# Patient Record
Sex: Female | Born: 1955 | Race: White | Hispanic: No | Smoking: Never smoker
Health system: Southern US, Community
[De-identification: ages and names within clinical notes are randomized; demographics above are authoritative.]

## PROBLEM LIST (undated history)

## (undated) DIAGNOSIS — T7840XA Allergy, unspecified, initial encounter: Secondary | ICD-10-CM

## (undated) DIAGNOSIS — G43909 Migraine, unspecified, not intractable, without status migrainosus: Secondary | ICD-10-CM

## (undated) DIAGNOSIS — Z8669 Personal history of other diseases of the nervous system and sense organs: Secondary | ICD-10-CM

## (undated) DIAGNOSIS — M109 Gout, unspecified: Secondary | ICD-10-CM

## (undated) DIAGNOSIS — J069 Acute upper respiratory infection, unspecified: Secondary | ICD-10-CM

## (undated) DIAGNOSIS — K219 Gastro-esophageal reflux disease without esophagitis: Secondary | ICD-10-CM

## (undated) DIAGNOSIS — H269 Unspecified cataract: Secondary | ICD-10-CM

## (undated) DIAGNOSIS — N3010 Interstitial cystitis (chronic) without hematuria: Secondary | ICD-10-CM

## (undated) DIAGNOSIS — F419 Anxiety disorder, unspecified: Secondary | ICD-10-CM

## (undated) DIAGNOSIS — E041 Nontoxic single thyroid nodule: Secondary | ICD-10-CM

## (undated) DIAGNOSIS — M1990 Unspecified osteoarthritis, unspecified site: Secondary | ICD-10-CM

## (undated) DIAGNOSIS — K224 Dyskinesia of esophagus: Secondary | ICD-10-CM

## (undated) DIAGNOSIS — G4730 Sleep apnea, unspecified: Secondary | ICD-10-CM

## (undated) DIAGNOSIS — M722 Plantar fascial fibromatosis: Secondary | ICD-10-CM

## (undated) DIAGNOSIS — E282 Polycystic ovarian syndrome: Secondary | ICD-10-CM

## (undated) DIAGNOSIS — M7660 Achilles tendinitis, unspecified leg: Secondary | ICD-10-CM

## (undated) DIAGNOSIS — D649 Anemia, unspecified: Secondary | ICD-10-CM

## (undated) DIAGNOSIS — J45909 Unspecified asthma, uncomplicated: Secondary | ICD-10-CM

## (undated) DIAGNOSIS — A150 Tuberculosis of lung: Secondary | ICD-10-CM

## (undated) DIAGNOSIS — M797 Fibromyalgia: Secondary | ICD-10-CM

## (undated) DIAGNOSIS — Z8659 Personal history of other mental and behavioral disorders: Secondary | ICD-10-CM

## (undated) DIAGNOSIS — E785 Hyperlipidemia, unspecified: Secondary | ICD-10-CM

## (undated) DIAGNOSIS — R112 Nausea with vomiting, unspecified: Secondary | ICD-10-CM

## (undated) DIAGNOSIS — Z87442 Personal history of urinary calculi: Secondary | ICD-10-CM

## (undated) DIAGNOSIS — L309 Dermatitis, unspecified: Secondary | ICD-10-CM

## (undated) DIAGNOSIS — R319 Hematuria, unspecified: Secondary | ICD-10-CM

## (undated) DIAGNOSIS — Z9889 Other specified postprocedural states: Secondary | ICD-10-CM

## (undated) DIAGNOSIS — F32A Depression, unspecified: Secondary | ICD-10-CM

## (undated) DIAGNOSIS — R7303 Prediabetes: Secondary | ICD-10-CM

## (undated) HISTORY — PX: NASAL SINUS SURGERY: SHX719

## (undated) HISTORY — DX: Interstitial cystitis (chronic) without hematuria: N30.10

## (undated) HISTORY — DX: Anxiety disorder, unspecified: F41.9

## (undated) HISTORY — PX: SPINE SURGERY: SHX786

## (undated) HISTORY — DX: Tuberculosis of lung: A15.0

## (undated) HISTORY — DX: Nontoxic single thyroid nodule: E04.1

## (undated) HISTORY — DX: Hyperlipidemia, unspecified: E78.5

## (undated) HISTORY — DX: Hematuria, unspecified: R31.9

## (undated) HISTORY — DX: Personal history of other diseases of the nervous system and sense organs: Z86.69

## (undated) HISTORY — DX: Dyskinesia of esophagus: K22.4

## (undated) HISTORY — PX: EYE SURGERY: SHX253

## (undated) HISTORY — DX: Dermatitis, unspecified: L30.9

## (undated) HISTORY — PX: COLONOSCOPY: SHX174

## (undated) HISTORY — DX: Personal history of other mental and behavioral disorders: Z86.59

## (undated) HISTORY — DX: Unspecified cataract: H26.9

## (undated) HISTORY — DX: Allergy, unspecified, initial encounter: T78.40XA

## (undated) HISTORY — DX: Acute upper respiratory infection, unspecified: J06.9

## (undated) HISTORY — DX: Achilles tendinitis, unspecified leg: M76.60

## (undated) HISTORY — DX: Plantar fascial fibromatosis: M72.2

## (undated) HISTORY — DX: Unspecified asthma, uncomplicated: J45.909

## (undated) HISTORY — DX: Polycystic ovarian syndrome: E28.2

## (undated) HISTORY — PX: OOPHORECTOMY: SHX86

---
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
## 1983-02-23 HISTORY — PX: CHOLECYSTECTOMY: SHX55

## 1987-02-23 HISTORY — PX: BREAST LUMPECTOMY: SHX2

## 1991-02-23 DIAGNOSIS — E282 Polycystic ovarian syndrome: Secondary | ICD-10-CM

## 1991-02-23 HISTORY — DX: Polycystic ovarian syndrome: E28.2

## 1992-02-23 HISTORY — PX: THYROID SURGERY: SHX805

## 1998-02-22 HISTORY — PX: HYSTERECTOMY ABDOMINAL WITH SALPINGECTOMY: SHX6725

## 1998-02-22 HISTORY — PX: CARDIOVASCULAR STRESS TEST: SHX262

## 1998-02-22 HISTORY — PX: ABDOMINAL HYSTERECTOMY: SHX81

## 1998-08-30 ENCOUNTER — Inpatient Hospital Stay (HOSPITAL_COMMUNITY): Admission: EM | Admit: 1998-08-30 | Discharge: 1998-08-31 | Payer: Self-pay | Admitting: Cardiology

## 1998-08-31 ENCOUNTER — Encounter: Payer: Self-pay | Admitting: Cardiology

## 2004-06-14 ENCOUNTER — Encounter: Admission: RE | Admit: 2004-06-14 | Discharge: 2004-06-14 | Payer: Self-pay | Admitting: Rheumatology

## 2005-02-22 DIAGNOSIS — J45909 Unspecified asthma, uncomplicated: Secondary | ICD-10-CM

## 2005-02-22 HISTORY — DX: Unspecified asthma, uncomplicated: J45.909

## 2005-06-30 ENCOUNTER — Encounter: Admission: RE | Admit: 2005-06-30 | Discharge: 2005-06-30 | Payer: Self-pay

## 2005-07-21 ENCOUNTER — Encounter: Admission: RE | Admit: 2005-07-21 | Discharge: 2005-07-21 | Payer: Self-pay

## 2006-05-05 ENCOUNTER — Ambulatory Visit: Payer: Self-pay | Admitting: Internal Medicine

## 2006-05-16 ENCOUNTER — Ambulatory Visit (HOSPITAL_COMMUNITY): Admission: RE | Admit: 2006-05-16 | Discharge: 2006-05-16 | Payer: Self-pay | Admitting: Internal Medicine

## 2006-05-16 ENCOUNTER — Ambulatory Visit: Payer: Self-pay | Admitting: Internal Medicine

## 2006-05-26 ENCOUNTER — Emergency Department (HOSPITAL_COMMUNITY): Admission: EM | Admit: 2006-05-26 | Discharge: 2006-05-26 | Payer: Self-pay | Admitting: Emergency Medicine

## 2006-07-06 ENCOUNTER — Encounter: Admission: RE | Admit: 2006-07-06 | Discharge: 2006-07-06 | Payer: Self-pay | Admitting: Internal Medicine

## 2006-07-23 ENCOUNTER — Encounter: Admission: RE | Admit: 2006-07-23 | Discharge: 2006-07-23 | Payer: Self-pay | Admitting: Orthopedic Surgery

## 2006-08-04 ENCOUNTER — Encounter: Admission: RE | Admit: 2006-08-04 | Discharge: 2006-08-04 | Payer: Self-pay | Admitting: Orthopedic Surgery

## 2006-08-22 ENCOUNTER — Encounter: Admission: RE | Admit: 2006-08-22 | Discharge: 2006-08-22 | Payer: Self-pay | Admitting: Orthopedic Surgery

## 2006-09-09 ENCOUNTER — Encounter: Admission: RE | Admit: 2006-09-09 | Discharge: 2006-09-09 | Payer: Self-pay | Admitting: Orthopedic Surgery

## 2006-11-04 ENCOUNTER — Encounter: Admission: RE | Admit: 2006-11-04 | Discharge: 2006-11-04 | Payer: Self-pay | Admitting: Neurosurgery

## 2007-09-04 ENCOUNTER — Encounter: Admission: RE | Admit: 2007-09-04 | Discharge: 2007-09-04 | Payer: Self-pay | Admitting: Internal Medicine

## 2007-12-31 ENCOUNTER — Encounter: Admission: RE | Admit: 2007-12-31 | Discharge: 2007-12-31 | Payer: Self-pay | Admitting: Orthopedic Surgery

## 2008-01-17 ENCOUNTER — Encounter: Admission: RE | Admit: 2008-01-17 | Discharge: 2008-01-17 | Payer: Self-pay | Admitting: Orthopedic Surgery

## 2008-01-24 ENCOUNTER — Encounter: Admission: RE | Admit: 2008-01-24 | Discharge: 2008-01-24 | Payer: Self-pay | Admitting: Orthopedic Surgery

## 2008-09-03 ENCOUNTER — Encounter: Admission: RE | Admit: 2008-09-03 | Discharge: 2008-09-03 | Payer: Self-pay | Admitting: Internal Medicine

## 2009-02-22 HISTORY — PX: SHOULDER SURGERY: SHX246

## 2009-06-03 ENCOUNTER — Emergency Department (HOSPITAL_COMMUNITY): Admission: EM | Admit: 2009-06-03 | Discharge: 2009-06-04 | Payer: Self-pay | Admitting: Emergency Medicine

## 2009-08-13 ENCOUNTER — Encounter: Admission: RE | Admit: 2009-08-13 | Discharge: 2009-08-13 | Payer: Self-pay | Admitting: Endocrinology

## 2009-10-22 ENCOUNTER — Encounter: Admission: RE | Admit: 2009-10-22 | Discharge: 2009-10-22 | Payer: Self-pay | Admitting: Internal Medicine

## 2010-03-15 ENCOUNTER — Encounter: Payer: Self-pay | Admitting: Internal Medicine

## 2010-03-15 ENCOUNTER — Encounter: Payer: Self-pay | Admitting: Orthopedic Surgery

## 2010-07-10 NOTE — Consult Note (Signed)
NAME:  Christina Lewis, Christina Lewis               ACCOUNT NO.:  [id number]   MEDICAL RECORD NO.:  [id number]           PATIENT TYPE:  AMB   LOCATION:  DAY                           FACILITY:  [facility]   PHYSICIAN:  Lionel December, M.D.    DATE OF BIRTH:  December 11, 1955   DATE OF CONSULTATION:  DATE OF DISCHARGE:                                 CONSULTATION   REASON FOR CONSULTATION:  Dysphagia.  Abnormal barium esophagogram.   HISTORY OF PRESENT ILLNESS:  Hildreth is a 55 year old Caucasian female  who is referred through courtesy of Dr. Truman Hayward for a GI  evaluation.  The patient developed symptoms of bronchitis and cough  about 2 months ago.  She was treated with multiple courses of antibiotic  and never had a full recovery.  She was sent over by Dr. Sherryll Burger for ENT  evaluation with Dr. Katrinka Blazing.  Dr. Katrinka Blazing obtained a barium study.  It  showed prominent cricopharyngeal posterior impression at cervical  esophagus at the level of C5-6 with focal esophageal narrowing.  There  was also suggestion of incomplete cricopharyngeal relaxation during  deglutition, felt to be the reason for her dysphagia.  The patient was  given a barium pill, which passed through the esophagus without any  difficulty.  She was also noted to have thickened mucosal folds at the  distal thoracic esophagus.  No GE reflux was noted during this study,  but she had a small sliding hiatal hernia.   The patient states that she had her esophagus dilated back in early  1990s while she was living in [place].  She was told  she had narrowing to her proximal esophagus.  She did well for several  years.  She has been experiencing dysphagia primarily to solids.  A few  years ago it was infrequent but now happening every day.  She has more  difficulty with breads, steaks and dry foods but generally does not have  any difficulty with liquids, although she may occasionally choke with  her saliva.  She rarely experiences heartburn.   She still has a cough,  although nothing like it was a few weeks ago.  She denies regurgitation,  odynophagia, abdominal pain, melena or rectal bleeding.  She has never  been diagnosed with gastroesophageal reflux disease before.  Since her  cough was not responding to antibiotics and other therapies, Dr. Sherryll Burger  felt that maybe she is refluxing and started her on a PPI.  She feels  Prevacid is giving her diarrhea and nausea and would like to try another  PPI.  She has a very good appetite.  She states she is always hungry.  She has gained 10 pounds the last one year.   REVIEW OF THE SYSTEMS:  Positive for myalgias and arthralgias.  She also  has history of intermittent headache.  He she has never undergone a  colonoscopy.   She is presently on:  1. Spirolactone 50 mg b.i.d.  2. Risperdal 4.25 mg daily.  3. Lyrica 50 mg b.i.d.  4. Lexapro 20 mg daily.  5. MVI daily.  6.  Loratadine 10 mg daily.  7. Metformin 1 g q.h.s.  8. ASA 81 mg daily.  9. __________  1 g t.i.d.  10.Magnesium,oxide, 60 mg daily.  11.Thiamine 100 mg daily.  12.CoQ-10 60 mg t.i.d.  13.Zanaflex 2 mg at noon and 4 mg every evening.  14.Glucosamine/chondroitin one daily.  15.MSM complex one daily.  16.Lidoderm patch to her upper back or neck, on 12 hours, off 12      hours, but she does not use it every day.  17.Prevacid 30 mg q.a.m.  18.Symbicort two puffs b.i.d.  19.Astelin two puffs b.i.d.  20.Carisoprodol 350 mg b.i.d.  21.Hydrocodone 10/325 mg q.i.d.  She generally takes two or less than      two doses per day.  22.Fioricet one b.i.d. p.r.n., which she takes a rarely for her      headache.  23.Maxalt 10 mg p.r.n.  24.Nabumetone 500 mg b.i.d.  25.Omega-3 one daily.  26.Biotin 100 mg daily.  27.Vitamin D 400 units b.i.d.  28.Calcium with D 1.8 g daily.  29.B1 daily.  30.She is also using a TENS unit to her neck.   PAST MEDICAL HISTORY:  This was nicely summarized by the patient and  typed.  She had  tonsillectomy in 1974, appendectomy in 1976.  She has  history of kidney stones.  She passed one in 1983, another one in 1996,  and yet another one in 2003 and in March 2004 she developed ureteral  obstruction and underwent cystoscopy with the right ureteric stenting.  She believes she has had calcium oxalate stones.  She had some surgery  for __________  in 1984 and another surgery, sinus surgery, in 2003.  She had a cystoscopy with right ureteric stenting in March 2004.   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  She had benign  lump removed from her left breast in 1989.  She had a large cyst  aspirated from her thyroid in 1994.  She has a history of multinodular  goiter.  She had hysterectomy with BSO in 2002.   She was treated for a positive PPD back in 1963.  She remembers she took  INH for 1 year.  She has a history of polycystic ovary disease diagnosed  in 1993.  She is having some problems with __________ ., etc., and on  Aldactone.  Sleep apnea.  She had a sleep study in 1997, 2003 and 2007,  and is using CPAP.  Diabetes mellitus was diagnosed in 2005.  She has  osteoarthrosis, fibromyalgia, history of bronchial asthma, __________  diagnosed 4 years ago.   ALLERGIES:  To CODEINE, resulting in pruritus and vomiting; SULFA  resulted in anaphylaxis; and she vomits with ERYTHROMYCIN.   FAMILY HISTORY:  Mother has diabetes mellitus and rheumatoid arthritis.  Father died at age 51 due to rheumatic fever with septicemia.  She has  two sisters and a brother.  The brother is in good health.  One sister  has polycystic ovary syndrome, intractable GERD and fibromyalgia, and  other one has psoriatic arthritis.   SOCIAL HISTORY:  She is married but does not have any children.  She  works at [employer].  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  She has never smoked cigarettes or drunk alcohol.  PHYSICAL EXAMINATION:  GENERAL:  A pleasant, mildly  obese Caucasian  female who is in no acute distress.  VITAL SIGNS:  She weighs 193 pounds.  She is 5 feet 4 inches tall.  Pulse 78 per minute, blood pressure 118/82, temperature is 98.6.  HEENT:  Conjunctivae are pink.  Sclerae are nonicteric.  Oropharyngeal  mucosa is normal.  Dentition in satisfactory condition.  NECK:  No neck masses or thyromegaly noted.  CARDIAC:  Regular rhythm.  Normal S1 and S2.  No murmur or gallop noted.  LUNGS:  Clear to auscultation.  ABDOMEN:  Full.  Bowel sounds are normal.  On palpation, is soft and  nontender without organomegaly or masses.  RECTAL:  Examination deferred.  EXTREMITIES:  No clubbing or edema noted.   Results of barium study reviewed above.  It was done on March 25, 2006.   ASSESSMENT:  Marialice is a 54 year old Caucasian female with multiple  medical problems, who presents with intermittent solid food dysphagia.  She also has chronic cough, recently treated with multiple courses of  antibiotic and an ENT evaluation by Dr. Katrinka Blazing was negative.  It is quite  possible that she has a chronic gastroesophageal reflux disease  resulting in cough.  Barium study suggests narrowing in the cervical  esophagus.  It is possible that she has a localized stricture which  apparently was dilated about 15 years ago.  She could also have  esophageal motility disorder.  I doubt that she has upper esophageal  sphincter dysfunction or achalasia.  If she were not to respond to  esophageal dilation, she would need further testing with esophageal  manometry including evaluation of her upper esophageal sphincter.   Questionable side effects with Prevacid.   RECOMMENDATIONS:  Esophagogastroduodenoscopy and possible dilation to be  performed at [facility] in the near future.   Will discontinue Prevacid and let her try Protonix 40 mg q.a.m., samples  given.  The patient advised to hold off her ASA for 3 days in  preparation for the procedure and, hopefully, she will  be able to go  back on it the day of the procedure.   We would like to thank Dr. Katrinka Blazing for the opportunity to participate in  the care of this nice lady.      Lionel December, M.D.  Electronically Signed     NR/MEDQ  D:  05/05/2006  T:  05/06/2006  Job:  [id number]   cc:   Truman Hayward, M.D.   Kirstie Peri, MD  Fax: [phone]   [facility]  Fax: [phone]

## 2010-07-10 NOTE — Op Note (Signed)
NAME:  Christina Lewis, Christina Lewis               ACCOUNT NO.:  [id number]   MEDICAL RECORD NO.:  [id number]          PATIENT TYPE:  AMB   LOCATION:  DAY                           FACILITY:  [facility]   PHYSICIAN:  Lionel December, M.D.    DATE OF BIRTH:  1955-09-05   DATE OF PROCEDURE:  05/16/2006  DATE OF DISCHARGE:                               OPERATIVE REPORT   PROCEDURE:  Esophagogastroduodenoscopy with esophageal dilation.   INDICATION:  Gavin is a 55 year old Caucasian female with chronic GERD,  who has also been having dysphagia and cough.  She was evaluated by Dr.  Truman Hayward.  She had a barium study which revealed a prominent  cricopharyngeus and focal narrowing at the level of C5 and C6.  She was  also noted to have a small sliding hiatal hernia and thickened folds at  distal thoracic esophagus.   She is undergoing diagnostic/therapeutic EGD.  Procedure risks were  reviewed the patient; informed consent was obtained.   MEDICATIONS FOR CONSCIOUS SEDATION:  Benzocaine spray for pharyngeal  topical anesthesia, Demerol 50 mg IV, Versed 6 mg IV.   FINDINGS:  Procedure performed in endoscopy suite.  The patient's vital  signs and O2 saturation were monitored during the procedure and remained  stable.  The patient was placed in left lateral decubitus position, and  the Pentax video scope was passed via oropharynx without any difficulty  into esophagus.   Esophagus:  Mucosa of the esophagus was carefully examined, and no  abnormality was noted other than single erosion proximal to GE junction.  GE junction was located at 34 cm, and hiatus was at 36 cm from the  incisors.  There was some erythema at the GE junction, but no ring or  stricture was noted.   Stomach:  It was empty and distended very well with insufflation.  Folds  of proximal stomach were normal.  Examination of mucosa at body, antrum,  pyloric channel as well as angularis, fundus, and cardia was normal.   Duodenum:  Bulbar  mucosa was normal.  The scope was passed into second  part of duodenum where mucosa and folds were normal.  Endoscope was  withdrawn.   Esophagus was dilated by passing 54 and 56-French Maloney dilators to  full insertion.  Esophageal mucosa was reexamined post dilation, and  there was no mucosal disruption noted.  Endoscope was withdrawn.  The  patient tolerated the procedure well.   FINAL DIAGNOSIS:  Erosive reflux esophagitis with small sliding hiatal  hernia.   No obvious narrowing or web noted to cervical esophagus.  Esophagus  dilated by passing 54 and 56-French Maloney dilators but no mucosal  disruption induced.   Small sliding hiatal hernia.   Normal exam of stomach, first and second part of the duodenum.   RECOMMENDATIONS:  Antireflux measures are reinforced.  Increase her  Prevacid to 30 mg b.i.d. x12 weeks.   The patient will call us with a progress report in 1 week.  If she  remains with dysphagia, she will need esophageal manometry with  attention to upper esophageal sphincter.  Lionel December, M.D.  Electronically Signed     NR/MEDQ  D:  05/16/2006  T:  05/16/2006  Job:  [id number]   cc:   Truman Hayward, MD   Kirstie Peri, MD  Fax: [phone]

## 2010-09-21 ENCOUNTER — Other Ambulatory Visit: Payer: Self-pay | Admitting: Internal Medicine

## 2010-09-21 DIAGNOSIS — Z1231 Encounter for screening mammogram for malignant neoplasm of breast: Secondary | ICD-10-CM

## 2010-10-02 ENCOUNTER — Ambulatory Visit (HOSPITAL_COMMUNITY)
Admission: RE | Admit: 2010-10-02 | Discharge: 2010-10-02 | Disposition: A | Discharge status: Home / Self Care | Payer: BC Managed Care – PPO | Source: Ambulatory Visit | Attending: Orthopedic Surgery | Admitting: Orthopedic Surgery

## 2010-10-02 DIAGNOSIS — M25519 Pain in unspecified shoulder: Secondary | ICD-10-CM | POA: Insufficient documentation

## 2010-10-02 DIAGNOSIS — IMO0001 Reserved for inherently not codable concepts without codable children: Secondary | ICD-10-CM | POA: Insufficient documentation

## 2010-10-02 DIAGNOSIS — M25619 Stiffness of unspecified shoulder, not elsewhere classified: Secondary | ICD-10-CM | POA: Insufficient documentation

## 2010-10-02 DIAGNOSIS — M7500 Adhesive capsulitis of unspecified shoulder: Secondary | ICD-10-CM | POA: Insufficient documentation

## 2010-10-02 DIAGNOSIS — M6281 Muscle weakness (generalized): Secondary | ICD-10-CM | POA: Insufficient documentation

## 2010-10-02 NOTE — Progress Notes (Signed)
Occupational Therapy Evaluation  Patient Name: Christina Lewis Date: 10/02/2010 Time In 1100 Time Out 1148 OT Evaluation 1100-1120 Manual Therapy [id number] Visit 1/18 Reassessment date: 10/30/10 DIAGNOSIS:  Right Adhesive Capsulitis   S:  Ive been having increased pain and decreased mobility in my right shoulder for a few months.  At first I thought it was my fibromyalgia."  History:  Mrs. Townsel first consulted with her rheumatologist, thinking her deficits were related to her fibromyalgia.  She received a cortisone injection, which did not alleviate her pain.  She had an MRI, which detected small rotator cuff tears.  She then was referred to Dr. Rennis Chris, who xrayed her shoulder and diagnosed her with adhesive capsulitis.  She has been referred to occupational thearpy for evaluation and treatment.    Pain Assessment Currently in Pain?: Yes Pain Score:   5 Pain Location: Shoulder Pain Orientation: Right Pain Type: Acute pain    Assessment RUE AROM (degrees) Right Shoulder Flexion  0-170: 135 Degrees Right Shoulder ABduction 0-140: 115 Degrees Right Shoulder Internal Rotation  0-70: 40 Degrees (shoulder was abducted to 90) Right Shoulder External Rotation  0-90: 58 Degrees (shoulder was abducted to 90) RUE Strength Right Shoulder Flexion:  (4+/5) Right Shoulder ABduction:  (4+/5) Right Shoulder Internal Rotation:  (4+/5) Right Shoulder External Rotation:  (4+/5)         Exercise/Treatments  Shoulder Exercises Shoulder Flexion: Supine;PROM;10 reps Shoulder Protraction: Supine;PROM;10 reps Shoulder Horizontal ABduction: Supine;PROM;10 reps Shoulder ABduction: Supine;PROM;10 reps Shoulder External Rotation: Supine;PROM;10 reps Shoulder Internal Rotation: Supine;PROM;10 reps Shoulder Stretch Corner Stretch:  (edcuated on for HEP) Cross Chest Stretch:  (educated on for HEP) Wall Stretch:  (Educated on for HEP) Table Stretch - Flexion:  (Educated to do with therapy  ball for HEP) Table Stretch - Abduction:  (Educated to do for HEP with therapy ball)  Manual Therapy Manual Therapy: Myofascial release Myofascial Release: MFR and manual stretching to right scapular, pectoral, trapezius, and upper arm.  Deep tissue massage to subscapularis.  Treatment done to decrease pain and restrictions and increase AROM.  HEP:  Therapy ball flex, abd, shoulder stretches. Goals Short Term Goals 3 weeks Short Term Goal 1: Patient will be educated on a HEP. Short Term Goal 2: Patient will decrease pain to 3/10 while fastening her bra. Short Term Goal 3: Patient will increase AROM by 10 degrees for increased independence fastening her bra. Short Term Goal 4: Patient will decrease fascial restrictions from mod-max to mod.   Long Term Goals 6 weeks Long Term Goal 1: Patient will return to prior level of independence with her B/IADLs and leisure activities. Long Term Goal 2: Patient will decrease pain to 1/10 in her right shoulder when washing dishes or cooking. Long Term Goal 3: Patient will increase AROM to [facility] for increased independence pulling up her pants and putting away dishes. Long Term Goal 4: Patient will increase right shoulder strength to 5/5 for increased independence with lifting groceries. Long Term Goal 5: Patient will decrease fascial restrictions from mod-max to min. End of Session Patient Active Problem List  Diagnoses  . Pain in joint, shoulder region  . Adhesive capsulitis of shoulder  . Muscle weakness (generalized)   End of Session Activity Tolerance: Patient tolerated treatment well General Behavior During Session: [facility] for tasks performed Cognition: [facility] for tasks performed OT Assessment and Plan Clinical Impression Statement: A:  Patient presents with decreased A/PROM, strength and increased pain and fascial retrictions in her right shoulder region. Prognosis:  Excellent OT Frequency: Min 3X/week OT Duration: 6 weeks OT  Treatment/Interventions: Self-care/ADL training;Therapeutic exercise;Manual therapy;Therapeutic activities;Patient/family education;Other (comment) (Modalities as needed) OT Plan: P:  MFR and manual stretching to right shoulder to decrease pain and increase AROM.  Ther Ex:  supine PROM, AAROM shoulder flexion, abd, ext rot, int rot, horiz abd, prot.  ball flex, abd, r/l, thumbtacks, elev/dep//prot/ret, wall wash, scapular tband, UBE  Time Calculation Start Time: 1100 Stop Time: 1148 Time Calculation (min): 48 min  Shirlean Mylar, OTR/L  10/02/2010, 3:11 PM

## 2010-10-06 ENCOUNTER — Ambulatory Visit (HOSPITAL_COMMUNITY)
Admission: RE | Admit: 2010-10-06 | Discharge: 2010-10-06 | Disposition: A | Discharge status: Home / Self Care | Payer: BC Managed Care – PPO | Source: Ambulatory Visit | Attending: Internal Medicine | Admitting: Internal Medicine

## 2010-10-06 DIAGNOSIS — M7500 Adhesive capsulitis of unspecified shoulder: Secondary | ICD-10-CM

## 2010-10-06 DIAGNOSIS — M6281 Muscle weakness (generalized): Secondary | ICD-10-CM

## 2010-10-06 DIAGNOSIS — M25519 Pain in unspecified shoulder: Secondary | ICD-10-CM

## 2010-10-06 NOTE — Progress Notes (Signed)
Occupational Therapy Treatment  Patient Name: Christina Lewis MRN: [id number] Today's Date: 10/06/2010 Time In 811 Time Out 902 Manual Therapy 811-830 Therapeutic Exercise 831-902  Visit 2/18  Reassessment date: 10/30/10  DIAGNOSIS: Right Adhesive Capsulitis  HPI: Symptoms/Limitations Symptoms: S:  I feel pretty good today, no real pain. Pain Assessment Currently in Pain?: No/denies        Exercise/Treatments Shoulder Exercises Shoulder Flexion: Supine;PROM;AAROM;10 reps Therapy Ball (Shoulder Flexion): 25 Shoulder Extension: Theraband;10 reps (red) Shoulder Retraction: Theraband;10 reps (red) Shoulder Protraction: Supine;PROM;AAROM;10 reps Shoulder Horizontal ABduction: Supine;PROM;AAROM;10 reps Shoulder ABduction: Supine;PROM;AAROM;10 reps Therapy Ball (Shoulder Abduction): 25 also 5 right and left Shoulder External Rotation: Supine;PROM;AAROM;10 reps Shoulder Internal Rotation: Supine;PROM;AAROM;10 reps Row: Theraband;10 reps (red) Additional ROM/Strengthening Exercises UBE (Upper Arm Bike): 3' and 3' 1.0 Thumb Tacks: 1 min Elevation/Depression - Shoulder Pro/Retraction:   Manual Therapy Manual Therapy: Myofascial release Myofascial Release: MFR and manual stretching to right scapular, pectoral, trapezius, and upper arm to decrease pain and restrictions and increase pain free range of motion.   Goals Short Term Goals Short Term Goal 1: Patient will be educated on a HEP. Short Term Goal 1 Progress: Progressing toward goal Short Term Goal 2: Patient will decrease pain to 3/10 while fastening her bra. Short Term Goal 2 Progress: Progressing toward goal Short Term Goal 3: Patient will increase AROM by 10 degrees for increased independence fastening her bra. Short Term Goal 3 Progress: Progressing toward goal Short Term Goal 4: Patient will decrease fascial restrictions from mod-max to mod.   Short Term Goal 4 Progress: Progressing toward goal Long Term  Goals Long Term Goal 1: Patient will return to prior level of independence with her B/IADLs and leisure activities. Long Term Goal 2: Patient will decrease pain to 1/10 in her right shoulder when washing dishes or cooking. Long Term Goal 3: Patient will increase AROM to [facility] for increased independence pulling up her pants and putting away dishes. Long Term Goal 4: Patient will increase right shoulder strength to 5/5 for increased independence with lifting groceries. Long Term Goal 5: Patient will decrease fascial restrictions from mod-max to min. End of Session Patient Active Problem List  Diagnoses  . Pain in joint, shoulder region  . Adhesive capsulitis of shoulder  . Muscle weakness (generalized)   End of Session Activity Tolerance: Patient tolerated treatment well General Behavior During Session: [facility] for tasks performed Cognition: [facility] for tasks performed OT Assessment and Plan Clinical Impression Statement: A:  Increased PROM this date. OT Plan: P:  Add wall wash and attempt AROM in supine.   Shirlean Mylar, OTR/L  10/06/2010, 9:14 AM

## 2010-10-08 ENCOUNTER — Ambulatory Visit (HOSPITAL_COMMUNITY)
Admission: RE | Admit: 2010-10-08 | Discharge: 2010-10-08 | Disposition: A | Discharge status: Home / Self Care | Payer: BC Managed Care – PPO | Source: Ambulatory Visit | Attending: Specialist | Admitting: Specialist

## 2010-10-08 DIAGNOSIS — M7500 Adhesive capsulitis of unspecified shoulder: Secondary | ICD-10-CM

## 2010-10-08 DIAGNOSIS — M25519 Pain in unspecified shoulder: Secondary | ICD-10-CM

## 2010-10-08 DIAGNOSIS — M6281 Muscle weakness (generalized): Secondary | ICD-10-CM

## 2010-10-08 NOTE — Progress Notes (Signed)
Occupational Therapy Treatment  Patient Name: Christina Lewis MRN: [id number] Today's Date: 10/08/2010 Time In 807 Time Out 859 Manual Therapy 807-824   Therapeutic Exercise 825-859 Visit 3/18  Reassessment date: 10/30/10  DIAGNOSIS: Right Adhesive Capsulitis   S:  I have been a little sore since Tuesday. Pain Assessment Currently in Pain?: Yes Pain Score:   4 Pain Location: Shoulder Pain Orientation: Right      Exercise/Treatments Shoulder Exercises Shoulder Flexion: Supine;AROM;10 reps;AAROM (12 reps) Therapy Ball (Shoulder Flexion): 25 Shoulder Extension: Theraband;15 reps (red) Shoulder Retraction: Theraband;15 reps (red) Shoulder Protraction: Supine;AROM;10 reps;AAROM (12 reps) Shoulder Horizontal ABduction: Supine;AROM;10 reps;AAROM (12 reps) Shoulder ABduction: Supine;AROM;10 reps;AAROM (12 reps) Therapy Ball (Shoulder Abduction): 25 also 5 right and left Shoulder External Rotation: Supine;AROM;10 reps;AAROM (12 reps) Shoulder Internal Rotation: Supine;AROM;10 reps;AAROM (12 reps) Row: Theraband;15 reps (red) Additional ROM/Strengthening Exercises UBE (Upper Arm Bike): 3' and 3' 1.0 Wall Wash: 2 minutes Thumb Tacks: 1 min Elevation/Depression - Shoulder Pro/Retraction: 1 min  Manual Therapy Manual Therapy: Myofascial release Myofascial Release: MFR and manual stretching to right scapular, pectoral, trapezius, and upper arm to decrease pain and restrictions and increase pain free range of motion.  Gentle traction to right arm.  Supine PROM 10 reps shoulder protraction, horiz abd, ext rot, int rot, flexion, and abduction.   Goals Short Term Goals Short Term Goal 1: Patient will be educated on a HEP. Short Term Goal 2: Patient will decrease pain to 3/10 while fastening her bra. Short Term Goal 3: Patient will increase AROM by 10 degrees for increased independence fastening her bra. Short Term Goal 4: Patient will decrease fascial restrictions from mod-max to mod.     Long Term Goals Long Term Goal 1: Patient will return to prior level of independence with her B/IADLs and leisure activities. Long Term Goal 2: Patient will decrease pain to 1/10 in her right shoulder when washing dishes or cooking. Long Term Goal 3: Patient will increase AROM to [facility] for increased independence pulling up her pants and putting away dishes. Long Term Goal 4: Patient will increase right shoulder strength to 5/5 for increased independence with lifting groceries. Long Term Goal 5: Patient will decrease fascial restrictions from mod-max to min. End of Session Patient Active Problem List  Diagnoses  . Pain in joint, shoulder region  . Adhesive capsulitis of shoulder  . Muscle weakness (generalized)   End of Session Activity Tolerance: Patient tolerated treatment well General Behavior During Session: [facility] for tasks performed Cognition: [facility] for tasks performed OT Assessment and Plan Clinical Impression Statement: A:  Added AROM in supine this date added wall wash. OT Plan: P:  Add seated AROM.     Shirlean Mylar, OTR/L  10/08/2010, 11:15 AM

## 2010-10-09 ENCOUNTER — Ambulatory Visit (HOSPITAL_COMMUNITY): Payer: BC Managed Care – PPO | Admitting: Specialist

## 2010-10-12 ENCOUNTER — Ambulatory Visit (HOSPITAL_COMMUNITY)
Admission: RE | Admit: 2010-10-12 | Discharge: 2010-10-12 | Disposition: A | Discharge status: Home / Self Care | Payer: BC Managed Care – PPO | Source: Ambulatory Visit | Attending: Occupational Therapy | Admitting: Occupational Therapy

## 2010-10-12 NOTE — Progress Notes (Signed)
Occupational Therapy Treatment  Patient Name: BRYLEIGH OTTAWAY MRN: [id number] Today's Date: 10/12/2010  Time In 108 Time Out 204  Manual Therapy 108-126  Therapeutic Exercise 127-204 Visit 4/18  Reassessment date: 10/30/10  DIAGNOSIS: Right Adhesive Capsulitis    HPI: Symptoms/Limitations Symptoms: I think the weather is making it hurt today. Pain Assessment Currently in Pain?: Yes Pain Score:   5 Pain Location: Shoulder Pain Orientation: Right Pain Type: Acute pain     Exercise/Treatments Shoulder Exercises Shoulder Flexion: PROM;AAROM;10 reps;Supine;Seated Therapy Ball (Shoulder Flexion): 25 Shoulder Protraction: PROM;AROM;10 reps;Supine Shoulder Horizontal ABduction: PROM;AROM;10 reps;Supine;Seated Shoulder ABduction: PROM;AROM;10 reps;Supine;Seated Therapy Ball (Shoulder Abduction): 25 also 5 right and left Shoulder External Rotation: PROM;AROM;10 reps;Supine;Seated Shoulder Internal Rotation: PROM;AROM;10 reps;Supine;Seated Manual Therapy Manual Therapy: Myofascial release Myofascial Release: MFR and manual stretching to right scapular, pectoral, trapezius, and upper arm. Deep tissue massage to subscapularis. Treatment done to decrease pain and restrictions and increase AROM.   Goals Short Term Goals Short Term Goal 1: Patient will be educated on a HEP. Short Term Goal 1 Progress: Progressing toward goal Short Term Goal 2: Patient will decrease pain to 3/10 while fastening her bra. Short Term Goal 2 Progress: Progressing toward goal Short Term Goal 3: Patient will increase AROM by 10 degrees for increased independence fastening her bra. Short Term Goal 3 Progress: Progressing toward goal Short Term Goal 4: Patient will decrease fascial restrictions from mod-max to mod.   Short Term Goal 4 Progress: Progressing toward goal Long Term Goals Long Term Goal 1: Patient will return to prior level of independence with her B/IADLs and leisure activities. Long Term  Goal 1 Progress: Progressing toward goal Long Term Goal 2: Patient will decrease pain to 1/10 in her right shoulder when washing dishes or cooking. Long Term Goal 2 Progress: Progressing toward goal Long Term Goal 3: Patient will increase AROM to [facility] for increased independence pulling up her pants and putting away dishes. Long Term Goal 3 Progress: Progressing toward goal Long Term Goal 4: Patient will increase right shoulder strength to 5/5 for increased independence with lifting groceries. Long Term Goal 4 Progress: Progressing toward goal Long Term Goal 5: Patient will decrease fascial restrictions from mod-max to min. Long Term Goal 5 Progress: Progressing toward goal End of Session Patient Active Problem List  Diagnoses  . Pain in joint, shoulder region  . Adhesive capsulitis of shoulder  . Muscle weakness (generalized)   End of Session Activity Tolerance: Patient tolerated treatment well OT Assessment and Plan Clinical Impression Statement: added seated AROM Prognosis: Good OT Plan: increase reps with AROM supine and seated.   Valta Dillon L. Claiborne Stroble, COTA/L  10/12/2010, 1:49 PM

## 2010-10-14 ENCOUNTER — Ambulatory Visit (HOSPITAL_COMMUNITY)
Admission: RE | Admit: 2010-10-14 | Discharge: 2010-10-14 | Disposition: A | Discharge status: Home / Self Care | Payer: BC Managed Care – PPO | Source: Ambulatory Visit | Attending: Orthopedic Surgery | Admitting: Orthopedic Surgery

## 2010-10-14 DIAGNOSIS — M6281 Muscle weakness (generalized): Secondary | ICD-10-CM

## 2010-10-14 DIAGNOSIS — M7500 Adhesive capsulitis of unspecified shoulder: Secondary | ICD-10-CM

## 2010-10-14 DIAGNOSIS — M25519 Pain in unspecified shoulder: Secondary | ICD-10-CM

## 2010-10-14 NOTE — Progress Notes (Signed)
Occupational Therapy Treatment  Patient Name: Christina Lewis MRN: [id number] Today's Date: 10/14/2010 Time In 108 Time Out 202 Manual Therapy 108-130 Therapeutic Exercise 131-202  Visit 5/18  Reassessment date: 10/30/10  DIAGNOSIS: Right Adhesive Capsulitis  S:  Its been sore the last few days, it may be the weather. Pain Assessment Currently in Pain?: Yes Pain Score:   4 Pain Location: Shoulder Pain Orientation: Right  Exercise/Treatments Shoulder Exercises Shoulder Flexion: Supine;PROM;10 reps;AROM;Seated (AROM 12 reps) Therapy Ball (Shoulder Flexion): 25 Shoulder Extension: Theraband;10 reps (green) Shoulder Retraction: Theraband;10 reps (gren) Shoulder Protraction: Supine;PROM;10 reps;AROM;Seated (AROM 12 reps) Shoulder Horizontal ABduction: Supine;PROM;10 reps;AROM;Seated (AROM 12 rreps) Shoulder ABduction: Supine;PROM;10 reps;AROM;Seated (AROM 12 reps) Therapy Ball (Shoulder Abduction): 25 also 5 right and left Shoulder External Rotation: Supine;PROM;10 reps;AROM;Seated (AROM 12 reps) Shoulder Internal Rotation: Supine;PROM;10 reps;AROM;Seated (AROM 12 reps) Row: Theraband;10 reps (green) Additional ROM/Strengthening Exercises UBE (Upper Arm Bike): 3' and 3' 1.5 Wall Wash: 3.5' Thumb Tacks: 1 min Elevation/Depression - Shoulder Pro/Retraction: Additional Elbow Exercises UBE (Upper Arm Bike): 3' and 3' 1.5  Manual Therapy Manual Therapy: Myofascial release Myofascial Release: MFR and manual stretching to right scapular, pectoral, trapezius, and upper arm, trigger point release done to subscapularis.  Posterior capsule stretch done x 1 min.  MFR completed to decrease pain and fascial restrictions and increase AROM.     Goals Short Term Goals Short Term Goal 1: Patient will be educated on a HEP. Short Term Goal 1 Progress: Progressing toward goal Short Term Goal 2: Patient will decrease pain to 3/10 while fastening her bra. Short Term Goal 2 Progress:  Progressing toward goal Short Term Goal 3: Patient will increase AROM by 10 degrees for increased independence fastening her bra. Short Term Goal 3 Progress: Progressing toward goal Short Term Goal 4: Patient will decrease fascial restrictions from mod-max to mod.   Short Term Goal 4 Progress: Progressing toward goal Long Term Goals Long Term Goal 1: Patient will return to prior level of independence with her B/IADLs and leisure activities. Long Term Goal 1 Progress: Progressing toward goal Long Term Goal 2: Patient will decrease pain to 1/10 in her right shoulder when washing dishes or cooking. Long Term Goal 2 Progress: Progressing toward goal Long Term Goal 3: Patient will increase AROM to [facility] for increased independence pulling up her pants and putting away dishes. Long Term Goal 3 Progress: Progressing toward goal Long Term Goal 4: Patient will increase right shoulder strength to 5/5 for increased independence with lifting groceries. Long Term Goal 4 Progress: Progressing toward goal Long Term Goal 5: Patient will decrease fascial restrictions from mod-max to min. Long Term Goal 5 Progress: Progressing toward goal End of Session Patient Active Problem List  Diagnoses  . Pain in joint, shoulder region  . Adhesive capsulitis of shoulder  . Muscle weakness (generalized)   End of Session Activity Tolerance: Patient tolerated treatment well General Behavior During Session: [facility] for tasks performed Cognition: [facility] for tasks performed OT Assessment and Plan Clinical Impression Statement: A:  Increased to 12 reps with supine and seated AROM exercises.  Added posterior capsule stretch.  Requires tactile cues for proper positioning for int rot and ext rot OT Plan: P:  Attempt 1 pound with supine exercises.  Increase to 2.0 on UBE.   Shirlean Mylar, OTR/L  10/14/2010, 2:01 PM

## 2010-10-15 ENCOUNTER — Inpatient Hospital Stay (HOSPITAL_COMMUNITY): Admission: RE | Admit: 2010-10-15 | Payer: BC Managed Care – PPO | Source: Ambulatory Visit | Admitting: Specialist

## 2010-10-19 ENCOUNTER — Ambulatory Visit (HOSPITAL_COMMUNITY)
Admission: RE | Admit: 2010-10-19 | Discharge: 2010-10-19 | Disposition: A | Discharge status: Home / Self Care | Payer: BC Managed Care – PPO | Source: Ambulatory Visit | Attending: Orthopedic Surgery | Admitting: Orthopedic Surgery

## 2010-10-19 DIAGNOSIS — M7500 Adhesive capsulitis of unspecified shoulder: Secondary | ICD-10-CM

## 2010-10-19 DIAGNOSIS — M25519 Pain in unspecified shoulder: Secondary | ICD-10-CM

## 2010-10-19 DIAGNOSIS — M6281 Muscle weakness (generalized): Secondary | ICD-10-CM

## 2010-10-19 NOTE — Progress Notes (Signed)
Occupational Therapy Treatment  Patient Name: Christina Lewis MRN: [id number] Today's Date: 10/19/2010    NFA:OZHY In 106 Time Out 158  Manual Therapy 106-131  Therapeutic Exercise 131-158  Visit 5/18  Reassessment date: 10/30/10  DIAGNOSIS: Right Adhesive Capsulitis  Symptoms/Limitations Symptoms: I feel so/so, I went and worked in the yard, my back hurts. Pain Assessment Currently in Pain?: Yes Pain Score:   3 Pain Location: Shoulder Pain Orientation: Right Pain Type: Acute pain      Exercise/Treatments   10/19/10 1311  Shoulder Flexion PROM;Strengthening;Right;10 reps;Supine Bar Weights/Barbell (Shoulder Flexion) 1 lb Therapy Ball (Shoulder Flexion) 25 Shoulder Extension Theraband;15 reps Theraband Level (Shoulder Extension) Level 3 (Green) Shoulder ABduction PROM;Strengthening;Right;10 reps;Supine Bar Weights/Barbell (Shoulder Abduction) 1 lb Therapy Ball (Shoulder Abduction) 25 also 5 right and left Shoulder Horizontal ABduction PROM;Strengthening;Right;10 reps;Supine Bar Weights/Barbell (Shoulder Horizontal Abduction) 1 lb Shoulder Retraction Theraband;15 reps Theraband Level (Shoulder Retraction) Level 3 (Green) Row [employer] reps Theraband Level (Row) Level 3 (Green) Shoulder Internal Rotation PROM;Strengthening;10 reps;Supine;Theraband;15 reps Theraband Level (Shoulder Internal Rotation) Level 3 (Green) Bar Weights/Barbell (Shoulder Internal Rotation) 1 lb Shoulder External Rotation PROM;Strengthening;Right;10 reps;Supine;Theraband;15 reps Theraband Level (Shoulder External Rotation) Level 3 (Green) Bar Weights/Barbell (Shoulder External Rotation) 1 lb UBE (Upper Arm Bike) 3' and 3' 2.0 Shoulder Protraction PROM;Strengthening;10 reps;Supine Bar Weights/Barbell (Shoulder Protraction) 1 lb Additional ROM/Strengthening Exercises Wall Wash 4' Thumb Tacks 1 min Elevation/Depression - Shoulder Pro/Retraction Manual Therapy Manual Therapy Myofascial  release  Manual Therapy Myofascial Release: MFR and manual stretching to right scapular, pectoral, trapezius, and upper arm. Deep tissue massage to subscapularis. Treatment done to decrease pain and restrictions and increase AROM.   Goals Short Term Goals Short Term Goal 1: Patient will be educated on a HEP. Short Term Goal 1 Progress: Progressing toward goal Short Term Goal 2: Patient will decrease pain to 3/10 while fastening her bra. Short Term Goal 2 Progress: Progressing toward goal Short Term Goal 3: Patient will increase AROM by 10 degrees for increased independence fastening her bra. Short Term Goal 3 Progress: Progressing toward goal Short Term Goal 4: Patient will decrease fascial restrictions from mod-max to mod.   Short Term Goal 4 Progress: Progressing toward goal Long Term Goals Long Term Goal 1: Patient will return to prior level of independence with her B/IADLs and leisure activities. Long Term Goal 1 Progress: Progressing toward goal Long Term Goal 2: Patient will decrease pain to 1/10 in her right shoulder when washing dishes or cooking. Long Term Goal 2 Progress: Progressing toward goal Long Term Goal 3: Patient will increase AROM to [facility] for increased independence pulling up her pants and putting away dishes. Long Term Goal 3 Progress: Progressing toward goal Long Term Goal 4: Patient will increase right shoulder strength to 5/5 for increased independence with lifting groceries. Long Term Goal 4 Progress: Progressing toward goal Long Term Goal 5: Patient will decrease fascial restrictions from mod-max to min. Long Term Goal 5 Progress: Progressing toward goal End of Session Patient Active Problem List  Diagnoses  . Pain in joint, shoulder region  . Adhesive capsulitis of shoulder  . Muscle weakness (generalized)   End of Session Activity Tolerance: Patient tolerated treatment well OT Assessment and Plan Clinical Impression Statement: added 1# to supine  exercises and added tband to HEP Prognosis: Good OT Plan: increase reps with supine and seated.  Attempt 1# with seated ex.   Nickayla Mcinnis L. Noralee Stain, COTA/L  10/19/2010, 6:15 PM

## 2010-10-21 ENCOUNTER — Ambulatory Visit (HOSPITAL_COMMUNITY)
Admission: RE | Admit: 2010-10-21 | Discharge: 2010-10-21 | Disposition: A | Discharge status: Home / Self Care | Payer: BC Managed Care – PPO | Source: Ambulatory Visit | Attending: Orthopedic Surgery | Admitting: Orthopedic Surgery

## 2010-10-21 NOTE — Progress Notes (Signed)
Occupational Therapy Treatment  Patient Name: Christina Lewis MRN: [id number] Today's Date: 10/21/2010    Time In 108 Time Out 205  Manual Therapy 108-134  Therapeutic Exercise 134-205  Visit 6/18  Reassessment date: 10/30/10  DIAGNOSIS: Right Adhesive Capsulitis     HPI: Symptoms/Limitations Symptoms: It hurts today, really bothers me. Pain Assessment Currently in Pain?: Yes Pain Score:   7 Pain Location: Shoulder Pain Orientation: Right Pain Type: Acute pain   Exercise/Treatments Shoulder Exercises Shoulder Flexion: PROM;Strengthening;15 reps;Supine Therapy Ball (Shoulder Flexion): 25 Bar Weights/Barbell (Shoulder Flexion): 1 lb Shoulder Extension:  (d/c to hep) Shoulder Retraction:  (d/c to hep) Shoulder Protraction: PROM;Strengthening;15 reps;Supine Bar Weights/Barbell (Shoulder Protraction): 1 lb Shoulder Horizontal ABduction: PROM;Strengthening;15 reps;Supine Bar Weights/Barbell (Shoulder Horizontal Abduction): 1 lb Shoulder ABduction: PROM;Strengthening;15 reps;Supine Therapy Ball (Shoulder Abduction): 25 also 5 right and left Bar Weights/Barbell (Shoulder Abduction): 1 lb Shoulder External Rotation: PROM;Strengthening;15 reps;Supine Theraband Level (Shoulder External Rotation):  (d/c to hep) Bar Weights/Barbell (Shoulder External Rotation): 1 lb Shoulder Internal Rotation: PROM;Strengthening;15 reps;Supine Theraband Level (Shoulder Internal Rotation):  (d/c to hep) Bar Weights/Barbell (Shoulder Internal Rotation): 1 lb Row:  (d/c to hep) Additional ROM/Strengthening Exercises UBE (Upper Arm Bike): 3' and 3' 2.0 Wall Wash: 4' Thumb Tacks: 1 min Elevation/Depression - Shoulder Pro/Retraction: Additional Elbow Exercises UBE (Upper Arm Bike): 3' and 3' 2.0     Goals Short Term Goals Short Term Goal 1: Patient will be educated on a HEP. Short Term Goal 1 Progress: Progressing toward goal Short Term Goal 2: Patient will decrease pain to 3/10 while  fastening her bra. Short Term Goal 2 Progress: Progressing toward goal Short Term Goal 3: Patient will increase AROM by 10 degrees for increased independence fastening her bra. Short Term Goal 3 Progress: Progressing toward goal Short Term Goal 4: Patient will decrease fascial restrictions from mod-max to mod.   Short Term Goal 4 Progress: Progressing toward goal Long Term Goals Long Term Goal 1: Patient will return to prior level of independence with her B/IADLs and leisure activities. Long Term Goal 1 Progress: Progressing toward goal Long Term Goal 2: Patient will decrease pain to 1/10 in her right shoulder when washing dishes or cooking. Long Term Goal 2 Progress: Progressing toward goal Long Term Goal 3: Patient will increase AROM to [facility] for increased independence pulling up her pants and putting away dishes. Long Term Goal 3 Progress: Progressing toward goal Long Term Goal 4: Patient will increase right shoulder strength to 5/5 for increased independence with lifting groceries. Long Term Goal 4 Progress: Progressing toward goal Long Term Goal 5: Patient will decrease fascial restrictions from mod-max to min. Long Term Goal 5 Progress: Progressing toward goal End of Session Patient Active Problem List  Diagnoses  . Pain in joint, shoulder region  . Adhesive capsulitis of shoulder  . Muscle weakness (generalized)   End of Session Activity Tolerance: Patient tolerated treatment well OT Assessment and Plan Clinical Impression Statement: Pain decreased to 4/10 after manuel.  Discussed that her yard work and weather may be causing increased pain. Prognosis: Good OT Plan: add seated AROM   Kwame Ryland L. Noralee Stain, COTA/L  10/21/2010, 2:18 PM

## 2010-10-23 ENCOUNTER — Ambulatory Visit (HOSPITAL_COMMUNITY)
Admission: RE | Admit: 2010-10-23 | Discharge: 2010-10-23 | Disposition: A | Discharge status: Home / Self Care | Payer: BC Managed Care – PPO | Source: Ambulatory Visit | Attending: Occupational Therapy | Admitting: Occupational Therapy

## 2010-10-23 DIAGNOSIS — M7500 Adhesive capsulitis of unspecified shoulder: Secondary | ICD-10-CM

## 2010-10-23 DIAGNOSIS — M6281 Muscle weakness (generalized): Secondary | ICD-10-CM

## 2010-10-23 DIAGNOSIS — M25519 Pain in unspecified shoulder: Secondary | ICD-10-CM

## 2010-10-23 NOTE — Progress Notes (Signed)
Occupational Therapy Treatment  Patient Name: Christina Lewis MRN: [id number] Today's Date: 10/23/2010   Time In 114 Time Out 201   Manual Therapy 114- 133 Therapeutic Exercise 134-201 Visit 6/18  Reassessment date: 10/30/10  DIAGNOSIS: Right Adhesive Capsulitis      HPI: Symptoms/Limitations Symptoms: S:  It is better.  I put some heat one it. Pain Assessment Currently in Pain?: Yes Pain Score:   3 Pain Location: Shoulder Pain Orientation: Right Pain Type: Acute pain  Precautions/Restrictions    Mobility       Exercise/Treatments Shoulder Exercises Shoulder Flexion: PROM;Strengthening;10 reps;Supine;Seated Therapy Ball (Shoulder Flexion): 25 Bar Weights/Barbell (Shoulder Flexion): 2 lbs;1 lb Shoulder Protraction: PROM;Strengthening;10 reps;Supine;Seated Bar Weights/Barbell (Shoulder Protraction): 2 lbs;1 lb Shoulder Horizontal ABduction: PROM;Strengthening;10 reps;Supine;Seated Bar Weights/Barbell (Shoulder Horizontal Abduction): 2 lbs Shoulder ABduction: PROM;Strengthening;10 reps;Supine;Seated Therapy Ball (Shoulder Abduction): 25 also 5 right and left Bar Weights/Barbell (Shoulder Abduction): 2 lbs;1 lb Shoulder External Rotation: PROM;Strengthening;15 reps;Supine;Seated Bar Weights/Barbell (Shoulder External Rotation): 2 lbs;1 lb Shoulder Internal Rotation: PROM;Strengthening;10 reps;Supine Bar Weights/Barbell (Shoulder Internal Rotation): 2 lbs;1 lb Additional ROM/Strengthening Exercises Wall Wash: 4' Thumb Tacks: 1 min Elevation/Depression - Shoulder Pro/Retraction:  Manual Therapy Manual Therapy: Myofascial release Myofascial Release: MFR and manual stretching to right scapular, pectoral, trapezius, and upper arm. Deep tissue massage to subscapularis. Treatment done to decrease pain and restrictions and increase AROM.   Goals Short Term Goals Short Term Goal 1: Patient will be educated on a HEP. Short Term Goal 1 Progress: Progressing toward  goal Short Term Goal 2: Patient will decrease pain to 3/10 while fastening her bra. Short Term Goal 2 Progress: Progressing toward goal Short Term Goal 3: Patient will increase AROM by 10 degrees for increased independence fastening her bra. Short Term Goal 3 Progress: Progressing toward goal Short Term Goal 4: Patient will decrease fascial restrictions from mod-max to mod.   Short Term Goal 4 Progress: Progressing toward goal Long Term Goals Long Term Goal 1: Patient will return to prior level of independence with her B/IADLs and leisure activities. Long Term Goal 1 Progress: Progressing toward goal Long Term Goal 2: Patient will decrease pain to 1/10 in her right shoulder when washing dishes or cooking. Long Term Goal 2 Progress: Progressing toward goal Long Term Goal 3: Patient will increase AROM to [facility] for increased independence pulling up her pants and putting away dishes. Long Term Goal 3 Progress: Progressing toward goal Long Term Goal 4: Patient will increase right shoulder strength to 5/5 for increased independence with lifting groceries. Long Term Goal 4 Progress: Progressing toward goal Long Term Goal 5: Patient will decrease fascial restrictions from mod-max to min. Long Term Goal 5 Progress: Progressing toward goal End of Session Patient Active Problem List  Diagnoses  . Pain in joint, shoulder region  . Adhesive capsulitis of shoulder  . Muscle weakness (generalized)   End of Session Activity Tolerance: Patient tolerated treatment well OT Assessment and Plan Clinical Impression Statement: add towel stretch to HEP OT Plan: add seated AROM   [place] L. Grover, COTA/L  10/23/2010, 4:13 PM

## 2010-10-27 ENCOUNTER — Ambulatory Visit
Admission: RE | Admit: 2010-10-27 | Discharge: 2010-10-27 | Disposition: A | Discharge status: Home / Self Care | Payer: BC Managed Care – PPO | Source: Ambulatory Visit | Attending: Internal Medicine | Admitting: Internal Medicine

## 2010-10-27 ENCOUNTER — Ambulatory Visit (HOSPITAL_COMMUNITY)
Admission: RE | Admit: 2010-10-27 | Discharge: 2010-10-27 | Disposition: A | Discharge status: Home / Self Care | Payer: BC Managed Care – PPO | Source: Ambulatory Visit | Attending: Orthopedic Surgery | Admitting: Orthopedic Surgery

## 2010-10-27 DIAGNOSIS — M25619 Stiffness of unspecified shoulder, not elsewhere classified: Secondary | ICD-10-CM | POA: Insufficient documentation

## 2010-10-27 DIAGNOSIS — M25519 Pain in unspecified shoulder: Secondary | ICD-10-CM | POA: Insufficient documentation

## 2010-10-27 DIAGNOSIS — Z1231 Encounter for screening mammogram for malignant neoplasm of breast: Secondary | ICD-10-CM

## 2010-10-27 DIAGNOSIS — IMO0001 Reserved for inherently not codable concepts without codable children: Secondary | ICD-10-CM | POA: Insufficient documentation

## 2010-10-27 DIAGNOSIS — M6281 Muscle weakness (generalized): Secondary | ICD-10-CM | POA: Insufficient documentation

## 2010-10-27 NOTE — Progress Notes (Signed)
Occupational Therapy Treatment  Patient Name: Christina Lewis MRN: [id number] Today's Date: 10/27/2010   Time In 235 Time Out 337  Manual Therapy 235-304  Therapeutic Exercise 304-337 Visit 7/18  Reassessment date: 11/24/10  DIAGNOSIS: Right Adhesive Capsulitis         HPI: Symptoms/Limitations Symptoms: S:  It is good now, it was killing me this morning. Pain Assessment Pain Score:   3 Pain Location: Shoulder Pain Orientation: Right Pain Type: Acute pain          Exercise/Treatments Shoulder Exercises Shoulder Flexion: PROM;Supine;10 reps;Strengthening;15 reps;Seated Therapy Ball (Shoulder Flexion): 25 Bar Weights/Barbell (Shoulder Flexion): 1 lb Shoulder Protraction: PROM;10 reps;Supine;Strengthening;Seated Bar Weights/Barbell (Shoulder Protraction): 1 lb Shoulder Horizontal ABduction: PROM;10 reps;Supine;Strengthening;15 reps;Seated Bar Weights/Barbell (Shoulder Horizontal Abduction): 1 lb Shoulder ABduction: PROM;10 reps;Supine;Strengthening;15 reps;Seated Therapy Ball (Shoulder Abduction): 25 also 5 right and left Bar Weights/Barbell (Shoulder Abduction): 1 lb Shoulder External Rotation: PROM;10 reps;Supine Bar Weights/Barbell (Shoulder External Rotation): 1 lb Shoulder Internal Rotation: PROM;10 reps;Supine;Strengthening;15 reps;Seated Bar Weights/Barbell (Shoulder Internal Rotation): 1 lb Additional ROM/Strengthening Exercises UBE (Upper Arm Bike): 3' and 3' 2.5 Wall Wash: 3" with 1# Thumb Tacks: 1 min Elevation/Depression - Shoulder Pro/Retraction: "W" Arms: 10 (x to v x 10) Shoulder Stretch External Rotation Stretch: 3 reps;10 seconds Additional Elbow Exercises UBE (Upper Arm Bike): 3' and 3' 2.5 Manual Therapy Myofascial Release: MFR and manual stretching to right scapular, pectoral, trapezius, and upper arm. Deep tissue massage to subscapularis. Treatment done to decrease pain and restrictions and increase AROM.   Goals Short Term  Goals Short Term Goal 1: Patient will be educated on a HEP. Short Term Goal 1 Progress: Met Short Term Goal 2: Patient will decrease pain to 3/10 while fastening her bra. Short Term Goal 2 Progress: Progressing toward goal Short Term Goal 3: Patient will increase AROM by 10 degrees for increased independence fastening her bra. Short Term Goal 3 Progress: Partly met Short Term Goal 4: Patient will decrease fascial restrictions from mod-max to mod.   Short Term Goal 4 Progress: Met Long Term Goals Long Term Goal 1: Patient will return to prior level of independence with her B/IADLs and leisure activities. Long Term Goal 1 Progress: Progressing toward goal Long Term Goal 2: Patient will decrease pain to 1/10 in her right shoulder when washing dishes or cooking. Long Term Goal 2 Progress: Progressing toward goal Long Term Goal 3: Patient will increase AROM to [facility] for increased independence pulling up her pants and putting away dishes. Long Term Goal 3 Progress: Met Long Term Goal 4: Patient will increase right shoulder strength to 5/5 for increased independence with lifting groceries. Long Term Goal 4 Progress: Progressing toward goal Long Term Goal 5: Patient will decrease fascial restrictions from mod-max to min. Long Term Goal 5 Progress: Progressing toward goal End of Session Patient Active Problem List  Diagnoses  . Pain in joint, shoulder region  . Adhesive capsulitis of shoulder  . Muscle weakness (generalized)   End of Session Activity Tolerance: Patient tolerated treatment well OT Assessment and Plan Clinical Impression Statement: added ER stretch for here and at home.  D/C supine ex.  See progress letter Prognosis: Good OT Frequency: Min 3X/week OT Duration: 4 weeks OT Treatment/Interventions: Self-care/ADL training;Therapeutic exercise;Manual therapy;Therapeutic activities;Patient/family education OT Plan: increase to 2# with seated ex.   Geovany Trudo L. Kathlyn Leachman, COTA/L   10/27/2010, 3:39 PM

## 2010-10-29 ENCOUNTER — Ambulatory Visit (HOSPITAL_COMMUNITY): Payer: BC Managed Care – PPO | Admitting: Specialist

## 2010-10-29 ENCOUNTER — Telehealth (HOSPITAL_COMMUNITY): Payer: Self-pay

## 2010-10-30 ENCOUNTER — Ambulatory Visit (HOSPITAL_COMMUNITY): Payer: BC Managed Care – PPO | Admitting: Occupational Therapy

## 2010-11-06 ENCOUNTER — Encounter (HOSPITAL_COMMUNITY)
Admission: RE | Admit: 2010-11-06 | Discharge: 2010-11-06 | Disposition: A | Discharge status: Home / Self Care | Payer: BC Managed Care – PPO | Source: Ambulatory Visit | Attending: Orthopedic Surgery | Admitting: Orthopedic Surgery

## 2010-11-06 ENCOUNTER — Other Ambulatory Visit (HOSPITAL_COMMUNITY): Payer: Self-pay | Admitting: Orthopedic Surgery

## 2010-11-06 DIAGNOSIS — M7501 Adhesive capsulitis of right shoulder: Secondary | ICD-10-CM

## 2010-11-06 LAB — COMPREHENSIVE METABOLIC PANEL
ALT: 27 U/L (ref 0–35)
AST: 22 U/L (ref 0–37)
Albumin: 4 g/dL (ref 3.5–5.2)
BUN: 17 mg/dL (ref 6–23)
Calcium: 10.5 mg/dL (ref 8.4–10.5)
Chloride: 107 mEq/L (ref 96–112)
GFR calc non Af Amer: >60 mL/min (ref >60)
Potassium: 3.8 mEq/L (ref 3.5–5.1)
Sodium: 143 mEq/L (ref 135–145)
Total Bilirubin: 0.2 mg/dL — ABNORMAL LOW (ref 0.3–1.2)
Total Protein: 6.7 g/dL (ref 6.0–8.3)

## 2010-11-06 LAB — PROTIME-INR
INR: 0.98 (ref 0.00–1.49)
Prothrombin Time: 13.2 seconds (ref 11.6–15.2)

## 2010-11-06 LAB — URINALYSIS, ROUTINE W REFLEX MICROSCOPIC
Bilirubin Urine: NEGATIVE
Glucose, UA: NEGATIVE mg/dL
Hgb urine dipstick: NEGATIVE
Leukocytes, UA: NEGATIVE
Nitrite: NEGATIVE
Protein, ur: NEGATIVE mg/dL
Urobilinogen, UA: 0.2 mg/dL (ref 0.0–1.0)

## 2010-11-06 LAB — CBC
Hemoglobin: 13.2 g/dL (ref 12.0–15.0)
MCHC: 33.6 g/dL (ref 30.0–36.0)
MCV: 90.1 fL (ref 78.0–100.0)

## 2010-11-06 LAB — SURGICAL PCR SCREEN
MRSA, PCR: NEGATIVE
Staphylococcus aureus: NEGATIVE

## 2010-11-12 ENCOUNTER — Ambulatory Visit (HOSPITAL_COMMUNITY)
Admission: RE | Admit: 2010-11-12 | Discharge: 2010-11-13 | DRG: 224 | Disposition: A | Discharge status: Home / Self Care | Payer: BC Managed Care – PPO | Source: Ambulatory Visit | Attending: Orthopedic Surgery | Admitting: Orthopedic Surgery

## 2010-11-12 DIAGNOSIS — M25819 Other specified joint disorders, unspecified shoulder: Secondary | ICD-10-CM | POA: Insufficient documentation

## 2010-11-12 DIAGNOSIS — M7500 Adhesive capsulitis of unspecified shoulder: Secondary | ICD-10-CM | POA: Insufficient documentation

## 2010-11-12 DIAGNOSIS — Z01818 Encounter for other preprocedural examination: Secondary | ICD-10-CM | POA: Insufficient documentation

## 2010-11-12 DIAGNOSIS — M19019 Primary osteoarthritis, unspecified shoulder: Secondary | ICD-10-CM | POA: Insufficient documentation

## 2010-11-12 DIAGNOSIS — Z01812 Encounter for preprocedural laboratory examination: Secondary | ICD-10-CM | POA: Insufficient documentation

## 2010-11-12 LAB — GLUCOSE, CAPILLARY
Glucose-Capillary: 123 mg/dL — ABNORMAL HIGH (ref 70–99)
Glucose-Capillary: 128 mg/dL — ABNORMAL HIGH (ref 70–99)

## 2010-11-13 ENCOUNTER — Ambulatory Visit (HOSPITAL_COMMUNITY)
Admission: RE | Admit: 2010-11-13 | Discharge: 2010-11-13 | Disposition: A | Discharge status: Home / Self Care | Payer: BC Managed Care – PPO | Source: Ambulatory Visit | Attending: Orthopedic Surgery | Admitting: Orthopedic Surgery

## 2010-11-13 DIAGNOSIS — M6281 Muscle weakness (generalized): Secondary | ICD-10-CM | POA: Insufficient documentation

## 2010-11-13 DIAGNOSIS — M25619 Stiffness of unspecified shoulder, not elsewhere classified: Secondary | ICD-10-CM | POA: Insufficient documentation

## 2010-11-13 DIAGNOSIS — IMO0001 Reserved for inherently not codable concepts without codable children: Secondary | ICD-10-CM | POA: Insufficient documentation

## 2010-11-13 DIAGNOSIS — M25519 Pain in unspecified shoulder: Secondary | ICD-10-CM | POA: Insufficient documentation

## 2010-11-13 LAB — GLUCOSE, CAPILLARY: Glucose-Capillary: 111 mg/dL — ABNORMAL HIGH (ref 70–99)

## 2010-11-13 NOTE — Progress Notes (Signed)
Occupational Therapy Evaluation  Patient Details  Name: Christina Lewis MRN: [id number] Date of Birth: 05-19-1955  Today's Date: 11/13/2010 Time: 1200-1230 Evaluation 1200-1210 Manual Therapy [id number] 19' Time Calculation (min): 30 min Visit#: 1  of 25   Re-eval: 12/10/10     Past Medical History: No past medical history on file. Past Surgical History: No past surgical history on file.  Subjective Symptoms/Limitations Symptoms: I had surgery yesterday, and just got out of the hospital today. Limitations: History:  Mrs. Curtner first consulted with her rheumatologist, thinking her deficits were related to her fibromyalgia.  She received a cortisone injection, which did not alleviate her pain.  She had an MRI, which detected small rotator cuff tears.  She then was referred to Dr. Rennis Chris, who xrayed her shoulder and diagnosed her with adhesive capsulitis.  She has been referred to occupational thearpy for evaluation and treatment.  She recieved occupational therapy from 10/02/10 through 10/29/10.  Dr. Rennis Chris then  recommended a scope and manipulation, which was completed on 11/12/10.  She has been referred to occupational therapy for evaluation and treatment. Pain Assessment Currently in Pain?: Yes Pain Score:   5 Pain Location: Shoulder Pain Orientation: Right Pain Type: Acute pain   Assessment RUE Assessment RUE Assessment:  (assessed in supine.  Ext and Int Rotation 0 abduction) RUE PROM (degrees) Right Shoulder Flexion  0-170: 82 Degrees Right Shoulder ABduction 0-140: 42 Degrees Right Shoulder Internal Rotation  0-70: 78 Degrees Right Shoulder External Rotation  0-90: 0 Degrees RUE Strength Right Shoulder Flexion:  (not assessed) Right Shoulder ABduction:  (not assessed) Right Shoulder Internal Rotation:  (not assessed) Right Shoulder External Rotation:  (not assessed)   Exercise/Treatments  11/13/10 0700  Shoulder Exercises: Supine  Protraction PROM;10 reps    Horizontal ABduction PROM;10 reps  External Rotation PROM;10 reps  Internal Rotation PROM;10 reps  Flexion PROM;10 reps  ABduction PROM;10 reps   Manual Therapy Manual Therapy: Myofascial release Myofascial Release: MFR and manual stretching to right scapular region to decrease pain and increase mobility.  Once bandage is removed add MFR to upper arm and pectoral region.  [id number]  Occupational Therapy Assessment and Plan OT Assessment and Plan Clinical Impression Statement: A:  Patient presents with increased pain and restrictions and decreased AROM and strength in her right shoulder.  Mrs. Pare had WFL in flexion and abduction by end of session. Rehab Potential: Excellent OT Frequency: Other (comment) (4 times a week for one week then 3 x week x 7 weeks.) OT Duration: 8 weeks OT Treatment/Interventions: Self-care/ADL training;Therapeutic exercise;Manual therapy;Patient/family education (HEP issued for PROM to shoulder in supine, towel slides.) OT Plan: P:   Skilled occupational therapy intervention to decrease pain and increase mobility in her right shoulder.  Tx plan:  MFR and manual stretching in supine to right shoulder.  Ther Ex:  Supine  PROM, AAROM, AROM to shoulder flexion, abduction, ext rot, int rot, horiz abd, prot,.  Seated elev, ext, ret, row.  therapy ball flex and abd, inclined table wash, pulleys, progress as tolerated.   Goals Short Term Goals(4 weeks) Short Term Goal 1: Patient will be educated on a HEP. Short Term Goal 2: Patient will decrease pain to 3/10 while fastening her bra. Short Term Goal 3: Patient will increase PROM to [facility] for increased independence fastening her bra. Short Term Goal 4: Patient will decrease fascial restrictions from max to mod. Short Term Goal 5: Patient will increase right shoulder strength to 3+/5 for increased ability to complete  ADLs. Long Term Goals (8 weeks) Long Term Goal 1: Patient will return to prior level of independence with  her B/IADLs and leisure activities. Long Term Goal 2: Patient will decrease pain to 1/10 in her right shoulder when washing dishes or cooking. Long Term Goal 3: Patient will increase AROM to WNL for increased independence pulling up her pants and putting away dishes. Long Term Goal 4: Patient will increase right shoulder strength to 5/5 for increased independence with lifting groceries. Long Term Goal 5: Patient will decrease fascial restrictions from max to min. End of Session Patient Active Problem List  Diagnoses  . Pain in joint, shoulder region  . Adhesive capsulitis of shoulder  . Muscle weakness (generalized)   End of Session Activity Tolerance: Patient tolerated treatment well General Behavior During Session: [facility] for tasks performed Cognition: [facility] for tasks performed  Time Calculation Start Time: 1200 Stop Time: 1230 Time Calculation (min): 30 min  Shirlean Mylar, OTR/L  11/13/2010, 1:11 PM

## 2010-11-16 ENCOUNTER — Ambulatory Visit (HOSPITAL_COMMUNITY)
Admission: RE | Admit: 2010-11-16 | Discharge: 2010-11-16 | Disposition: A | Discharge status: Home / Self Care | Payer: BC Managed Care – PPO | Source: Ambulatory Visit | Attending: Internal Medicine | Admitting: Internal Medicine

## 2010-11-16 NOTE — Progress Notes (Signed)
Occupational Therapy Treatment  Patient Details  Name: Christina Lewis MRN: [id number] Date of Birth: 12-Sep-1955  Today's Date: 11/16/2010 Time: 5621-3086 Time Calculation (min): 22 min Visit#: 2  of 25   Re-eval: 12/10/10 Manuel Therapy [phone] (22)  Subjective Symptoms/Limitations Symptoms: I don't know if I want to be here, I couldn't sleep really well last night. Pain Assessment Currently in Pain?: Yes Pain Score:   6 Pain Location: Shoulder Pain Orientation: Right Pain Type: Surgical pain     Exercise/Treatments Supine Protraction: PROM;10 reps Horizontal ABduction: PROM;10 reps External Rotation: PROM;10 reps Internal Rotation: PROM;10 reps Flexion: PROM;10 reps ABduction: PROM;10 reps           Manual Therapy Manual Therapy: Myofascial release Myofascial Release: MFR and manual stretching to right scapular, pectoral, trapezius, and upper arm. Deep tissue massage to subscapularis. Treatment done to decrease pain and restrictions and increase AROM.  Occupational Therapy Assessment and Plan OT Assessment and Plan Clinical Impression Statement: Patient with decreased pain after manuel.  Tolerated PROM well. Rehab Potential: Excellent OT Plan: Continue with PROM    Goals   End of Session Patient Active Problem List  Diagnoses  . Pain in joint, shoulder region  . Adhesive capsulitis of shoulder  . Muscle weakness (generalized)   End of Session Activity Tolerance: Patient tolerated treatment well   Vaughan Browner 11/16/2010, 5:38 PM

## 2010-11-17 ENCOUNTER — Ambulatory Visit (HOSPITAL_COMMUNITY)
Admission: RE | Admit: 2010-11-17 | Discharge: 2010-11-17 | Disposition: A | Discharge status: Home / Self Care | Payer: BC Managed Care – PPO | Source: Ambulatory Visit | Attending: Internal Medicine | Admitting: Internal Medicine

## 2010-11-17 NOTE — Progress Notes (Signed)
Occupational Therapy Treatment  Patient Details  Name: Christina Lewis MRN: [id number] Date of Birth: 1956/01/13  Today's Date: 11/17/2010 Time: 7829-5621 Manual Therapy 154-217 23' Therapeutic Exercise 218-230 12' IFES with heat 235-250 15' Time Calculation (min): 56 min Visit#: 3  of 25   Re-eval: 12/10/10    Subjective Symptoms/Limitations Symptoms: S:  I have been just miserable.  I didnt think it would hurt this bad. Limitations: Discussed all the components of surgery and the possiblity of heat for pain control. Pain Assessment Currently in Pain?: Yes Pain Score:   7 Pain Location: Shoulder Pain Orientation: Right Pain Type: Acute pain   Exercise/Treatments  11/17/10 0700 Shoulder Exercises: Supine Protraction PROM;10 reps;AAROM;5 reps Horizontal ABduction PROM;10 reps;AAROM;5 reps External Rotation PROM;10 reps;AAROM;5 reps Internal Rotation PROM;10 reps;AAROM;5 reps Flexion PROM;10 reps;AAROM;5 reps ABduction PROM;10 reps;AAROM;5 reps Shoulder Exercises: Seated Elevation 10 reps Extension 10 reps Retraction 10 reps Shoulder Exercises: Pulleys Flexion 1 minute ABduction 1 minute Shoulder Exercises: Therapy Ball Flexion 10 reps ABduction 10 reps  Modalities Modalities: Archivist Stimulation Location: IFES to right shoulder x 15 minutes with heat Electrical Stimulation Action: decrease pain Electrical Stimulation Parameters: 15 minutes 9.6 volts Electrical Stimulation Goals: Pain  Manual Therapy Myofascial Release: MFR and manual stretching to right scapular, pectoral, trapezius, subscapularis,and upper arm to decrease pain and restrictions and increase ROM.  [phone]  Occupational Therapy Assessment and Plan OT Assessment and Plan Clinical Impression Statement: A:  Despite high pain, patient has WFL PROM. OT Plan: P:  Add dowel rod in supine, decrease pain to 5/10 in right shoulder.   Goals Short Term  Goals Short Term Goal 1: Patient will be educated on a HEP. Short Term Goal 1 Progress: Progressing toward goal Short Term Goal 2: Patient will decrease pain to 3/10 while fastening her bra. Short Term Goal 2 Progress: Progressing toward goal Short Term Goal 3: Patient will increase PROM to [facility] for increased independence fastening her bra. Short Term Goal 3 Progress: Progressing toward goal Short Term Goal 4: Patient will decrease fascial restrictions from max to mod. Short Term Goal 4 Progress: Progressing toward goal Short Term Goal 5: Patient will increase right shoulder strength to 3+/5 for increased ability to complete ADLs. Short Term Goal 5 Progress: Progressing toward goal Long Term Goals Long Term Goal 1: Patient will return to prior level of independence with her B/IADLs and leisure activities. Long Term Goal 1 Progress: Progressing toward goal Long Term Goal 2: Patient will decrease pain to 1/10 in her right shoulder when washing dishes or cooking. Long Term Goal 2 Progress: Progressing toward goal Long Term Goal 3: Patient will increase AROM to WNL for increased independence pulling up her pants and putting away dishes. Long Term Goal 3 Progress: Progressing toward goal Long Term Goal 4: Patient will increase right shoulder strength to 5/5 for increased independence with lifting groceries. Long Term Goal 4 Progress: Progressing toward goal Long Term Goal 5: Patient will decrease fascial restrictions from max to min. Long Term Goal 5 Progress: Progressing toward goal End of Session Patient Active Problem List  Diagnoses  . Pain in joint, shoulder region  . Adhesive capsulitis of shoulder  . Muscle weakness (generalized)   End of Session Activity Tolerance: Patient tolerated treatment well General Behavior During Session: [facility] for tasks performed Cognition: [facility] for tasks performed   Shirlean Mylar, OTR/L  11/17/2010, 3:35 PM

## 2010-11-18 ENCOUNTER — Telehealth (HOSPITAL_COMMUNITY): Payer: Self-pay | Admitting: Occupational Therapy

## 2010-11-18 ENCOUNTER — Ambulatory Visit (HOSPITAL_COMMUNITY)
Admission: RE | Admit: 2010-11-18 | Discharge: 2010-11-18 | Disposition: A | Discharge status: Home / Self Care | Payer: BC Managed Care – PPO | Source: Ambulatory Visit | Attending: Specialist | Admitting: Specialist

## 2010-11-18 DIAGNOSIS — M6281 Muscle weakness (generalized): Secondary | ICD-10-CM

## 2010-11-18 DIAGNOSIS — M25519 Pain in unspecified shoulder: Secondary | ICD-10-CM

## 2010-11-18 DIAGNOSIS — M7500 Adhesive capsulitis of unspecified shoulder: Secondary | ICD-10-CM

## 2010-11-18 NOTE — Progress Notes (Signed)
Occupational Therapy Treatment  Patient Details  Name: Christina Lewis MRN: [id number] Date of Birth: Apr 15, 1955  Today's Date: 11/18/2010 Time: 4098-1191 Time Calculation (min): 37 min Visit#: 4  of 25   Re-eval: 12/10/10  Manual Therapy [id number] 22' IFES with heat 1213-1228 15'   Subjective Symptoms/Limitations Symptoms: I have had a pain pill, without pain pill 8/10 Pain Assessment Currently in Pain?: Yes Pain Score:   2 Pain Location: Shoulder Pain Orientation: Right Pain Type: Surgical pain    Exercise/Treatments  O  11/18/10 1218  Shoulder Exercises: Supine  Protraction PROM;10 reps;AAROM;5 reps  Horizontal ABduction PROM;10 reps;AAROM;5 reps  External Rotation PROM;10 reps;AAROM;5 reps  Internal Rotation PROM;10 reps;AAROM;5 reps  Flexion PROM;10 reps;AAROM;5 reps  Shoulder Exercises: Seated  Elevation (time)  Extension (time)  Retraction (time)  Row (time)  Shoulder Exercises: Pulleys  Flexion (time)  ABduction (time)  Shoulder Exercises: Therapy Ball  Flexion (time)  ABduction (time)    Occupational Therapy Assessment and Plan OT Assessment and Plan Clinical Impression Statement: Increased releif from pain after MFR to subscapular area. Rehab Potential: Excellent OT Plan: Add dowel rod in supine.   Goals  patient progressing toward all goals End of Session Patient Active Problem List  Diagnoses  . Pain in joint, shoulder region  . Adhesive capsulitis of shoulder  . Muscle weakness (generalized)   End of Session Activity Tolerance: Patient tolerated treatment well General Behavior During Session: [facility] for tasks performed Cognition: [facility] for tasks performed  Kairy Folsom L. Lesha Jager, COTA/L  11/18/2010, 6:30 PM

## 2010-11-20 ENCOUNTER — Ambulatory Visit (HOSPITAL_COMMUNITY): Payer: BC Managed Care – PPO | Admitting: Occupational Therapy

## 2010-11-20 ENCOUNTER — Telehealth (HOSPITAL_COMMUNITY): Payer: Self-pay | Admitting: Occupational Therapy

## 2010-11-23 ENCOUNTER — Ambulatory Visit (HOSPITAL_COMMUNITY)
Admission: RE | Admit: 2010-11-23 | Discharge: 2010-11-23 | Disposition: A | Discharge status: Home / Self Care | Payer: BC Managed Care – PPO | Source: Ambulatory Visit | Attending: Orthopedic Surgery | Admitting: Orthopedic Surgery

## 2010-11-23 DIAGNOSIS — M6281 Muscle weakness (generalized): Secondary | ICD-10-CM | POA: Insufficient documentation

## 2010-11-23 DIAGNOSIS — M25519 Pain in unspecified shoulder: Secondary | ICD-10-CM | POA: Insufficient documentation

## 2010-11-23 DIAGNOSIS — M25619 Stiffness of unspecified shoulder, not elsewhere classified: Secondary | ICD-10-CM | POA: Insufficient documentation

## 2010-11-23 DIAGNOSIS — IMO0001 Reserved for inherently not codable concepts without codable children: Secondary | ICD-10-CM | POA: Insufficient documentation

## 2010-11-23 NOTE — Progress Notes (Signed)
Occupational Therapy Treatment  Patient Details  Name: Christina Lewis MRN: [id number] Date of Birth: 1955/07/02  Today's Date: 11/23/2010 Time: 4259-5638 Manual therapy 7564-3329 Time Calculation (min): 37 min Visit#: 5  of 25   Re-eval: 12/10/10    Subjective Symptoms/Limitations Symptoms: S:  I have been experiencing horrible pain since Saturday!  Spasmy pain that is in my shoulder and entire upper arm.  It may be a fibromyalgia flare up, I am not sure. Pain Assessment Currently in Pain?: Yes Pain Score:   3 Pain Location: Shoulder Pain Orientation: Right  Precautions/Restrictions     Mobility       Exercise/Treatments  11/23/10 0700  Shoulder Exercises: Supine  Protraction PROM;10 reps  Horizontal ABduction PROM;10 reps  External Rotation PROM;10 reps  Internal Rotation PROM;10 reps  Flexion PROM;10 reps  ABduction PROM;10 reps   Manual Therapy Manual Therapy: Myofascial release Myofascial Release: MFR and manual stretching to right scapular, pectoral, trapezius, subscapularis, and upper arm to decrease pain and restrictions and increase painfree PROM. 103-140  Occupational Therapy Assessment and Plan OT Assessment and Plan Clinical Impression Statement: A:  Held all therapeutic exercises per patient request.  PROM is full this date. OT Plan: P:  Resume all ther ex, dowel, ball flexion and abduction, wall wash, thumbtacks, prot/ret//elev/dep, pulleys, prgress to more advanced exercises as tolerated.   Goals Short Term Goals Short Term Goal 1: Patient will be educated on a HEP. Short Term Goal 1 Progress: Progressing toward goal Short Term Goal 2: Patient will decrease pain to 3/10 while fastening her bra. Short Term Goal 2 Progress: Progressing toward goal Short Term Goal 3: Patient will increase PROM to [facility] for increased independence fastening her bra. Short Term Goal 3 Progress: Progressing toward goal Short Term Goal 4: Patient will decrease fascial  restrictions from max to mod. Short Term Goal 4 Progress: Progressing toward goal Short Term Goal 5: Patient will increase right shoulder strength to 3+/5 for increased ability to complete ADLs. Short Term Goal 5 Progress: Progressing toward goal Long Term Goals Long Term Goal 1: Patient will return to prior level of independence with her B/IADLs and leisure activities. Long Term Goal 1 Progress: Progressing toward goal Long Term Goal 2: Patient will decrease pain to 1/10 in her right shoulder when washing dishes or cooking. Long Term Goal 2 Progress: Progressing toward goal Long Term Goal 3: Patient will increase AROM to WNL for increased independence pulling up her pants and putting away dishes. Long Term Goal 3 Progress: Progressing toward goal Long Term Goal 4: Patient will increase right shoulder strength to 5/5 for increased independence with lifting groceries. Long Term Goal 4 Progress: Progressing toward goal Long Term Goal 5: Patient will decrease fascial restrictions from max to min. Long Term Goal 5 Progress: Progressing toward goal End of Session Patient Active Problem List  Diagnoses  . Pain in joint, shoulder region  . Adhesive capsulitis of shoulder  . Muscle weakness (generalized)   End of Session Activity Tolerance: Patient tolerated treatment well General Behavior During Session: [facility] for tasks performed Cognition: [facility] for tasks performed   Jacqualine Code 11/23/2010, 1:47 PM

## 2010-11-25 ENCOUNTER — Ambulatory Visit (HOSPITAL_COMMUNITY)
Admission: RE | Admit: 2010-11-25 | Discharge: 2010-11-25 | Disposition: A | Discharge status: Home / Self Care | Payer: BC Managed Care – PPO | Source: Ambulatory Visit | Attending: Specialist | Admitting: Specialist

## 2010-11-25 NOTE — Progress Notes (Signed)
Occupational Therapy Treatment  Patient Details  Name: Christina Lewis MRN: [id number] Date of Birth: [birth year]  Today's Date: 11/25/2010 Time: 5621-3086  Manual Therapy 1315-1330 15' Therapeutic Exercises [phone] 16' Visit#: 6  of 25   Re-eval: 12/10/10    Subjective S:  I think I figured out what I was doing to make my shoulder ache.  I was fluffing the bedsheets.  I have stopped doing that. Pain Assessment Currently in Pain?: Yes Pain Score:   1 Pain Location: Shoulder Pain Orientation: Right Pain Type: Acute pain  O:  Exercise/Treatments  11/25/10 0700 Shoulder Exercises: Supine Protraction PROM;AAROM;AROM;10 reps Horizontal ABduction PROM;AROM;AAROM;10 reps External Rotation PROM;AROM;AAROM;10 reps Internal Rotation PROM;AROM;AAROM;10 reps Flexion PROM;AROM;AAROM;10 reps ABduction PROM;AROM;AAROM;10 reps Shoulder Exercises: Seated Extension Theraband;10 reps (red) Retraction Theraband;10 reps (red) Row Theraband;10 reps (red) Shoulder Exercises: Therapy Ball Flexion 15 reps ABduction 15 reps Shoulder Exercises: ROM/Strengthening UBE (Upper Arm Bike) begin next visit Wall Wash 1 min Thumb Tacks 1 min Prot/Ret//Elev/Dep 1 min Rhythmic Stabilization, Supine     Manual Therapy Manual Therapy: Myofascial release Myofascial Release: MFR and manual stretching to right scapular, pectoral, trapezius, subscapularis, and upper arm to decrease pain and restrictions and increase pain fee PROM and AROM.  115-130   Occupational Therapy Assessment and Plan OT Assessment and Plan Clinical Impression Statement: A:  Significant decrease in pain and stiffness and increased ability to complete therapeutic exercises this date. OT Plan: P:  Add seated AROM and UBE.   Goals Short Term Goals Short Term Goal 1: Patient will be educated on a HEP. Short Term Goal 2: Patient will decrease pain to 3/10 while fastening her bra. Short Term Goal 3: Patient will increase PROM to  [facility] for increased independence fastening her bra. Short Term Goal 4: Patient will decrease fascial restrictions from max to mod. Short Term Goal 5: Patient will increase right shoulder strength to 3+/5 for increased ability to complete ADLs. Long Term Goals Long Term Goal 1: Patient will return to prior level of independence with her B/IADLs and leisure activities. Long Term Goal 2: Patient will decrease pain to 1/10 in her right shoulder when washing dishes or cooking. Long Term Goal 3: Patient will increase AROM to WNL for increased independence pulling up her pants and putting away dishes. Long Term Goal 4: Patient will increase right shoulder strength to 5/5 for increased independence with lifting groceries. Long Term Goal 5: Patient will decrease fascial restrictions from max to min. End of Session Patient Active Problem List  Diagnoses  . Pain in joint, shoulder region  . Adhesive capsulitis of shoulder  . Muscle weakness (generalized)   End of Session Activity Tolerance: Patient tolerated treatment well General Behavior During Session: [facility] for tasks performed Cognition: [facility] for tasks performed   Shirlean Mylar, OTR/L  11/25/2010, 2:24 PM

## 2010-11-27 ENCOUNTER — Ambulatory Visit (HOSPITAL_COMMUNITY)
Admission: RE | Admit: 2010-11-27 | Discharge: 2010-11-27 | Disposition: A | Discharge status: Home / Self Care | Payer: BC Managed Care – PPO | Source: Ambulatory Visit | Attending: Occupational Therapy | Admitting: Occupational Therapy

## 2010-11-27 NOTE — Progress Notes (Signed)
Occupational Therapy Treatment  Patient Details  Name: Christina Lewis MRN: [id number] Date of Birth: 02-10-1956  Today's Date: 11/27/2010 Time: 1400-1504 Time Calculation (min): 64 min Visit#: 7  of 25   Re-eval: 12/10/10 Manual Therapy ' [phone]  26' Therapeutic Exercises 227-250  46' IFES with moist heat.  250-305  15'    Subjective Symptoms/Limitations Symptoms: I feel much better today. Pain Assessment Pain Score:   2 Pain Location: Shoulder Pain Orientation: Right Pain Type: Acute pain   Exercise/Treatments Supine Protraction: PROM;10 reps;AROM;12 reps Horizontal ABduction: PROM;10 reps;AROM;12 reps External Rotation: PROM;10 reps;AROM;12 reps Internal Rotation: PROM;10 reps;AROM;12 reps Flexion: PROM;10 reps;AROM;12 reps ABduction: PROM;10 reps;AROM;12 reps Seated Extension: 12 reps Theraband Level (Shoulder Extension): Level 2 (Red) Retraction: 12 reps Theraband Level (Shoulder Retraction): Level 2 (Red) Row: 12 reps Theraband Level (Shoulder Row): Level 2 (Red) Therapy Ball Flexion: 20 reps ABduction: 20 reps ROM / Strengthening / Isometric Strengthening UBE (Upper Arm Bike): 3 min forward and 3 min reverse. Wall Wash: 2' Thumb Tacks: 1 "W" Arms: 10 Prot/Ret//Elev/Dep: 1 min   Manual Therapy Manual Therapy: Myofascial release Myofascial Release: MFR and manual stretching to right scapular, pectoral, trapezius, and upper arm. Deep tissue massage to subscapularis. Treatment done to decrease pain and restrictions and increase AROM. Moist Heat Therapy Number Minutes Moist Heat: 15 Minutes Moist Heat Location: Shoulder Electrical Stimulation Electrical Stimulation Location: right shoulder Electrical Stimulation Action: decrease pain Electrical Stimulation Parameters: 15' Electrical Stimulation Goals: Pain  Occupational Therapy Assessment and Plan OT Assessment and Plan Clinical Impression Statement: A:  Added seated AROM and UBE.  Patient had  increased pain with the 3 min reverse on UBE.  Used IFES with MH to decrease pain. Rehab Potential: Excellent OT Plan: P:  Decrease restrictions in ant. pect. and subscapularis to increase pain free ROM   Goals Short Term Goals Short Term Goal 1: Patient will be educated on a HEP. Short Term Goal 2: Patient will decrease pain to 3/10 while fastening her bra. Short Term Goal 3: Patient will increase PROM to [facility] for increased independence fastening her bra. Short Term Goal 4: Patient will decrease fascial restrictions from max to mod. Short Term Goal 5: Patient will increase right shoulder strength to 3+/5 for increased ability to complete ADLs. Long Term Goals Long Term Goal 1: Patient will return to prior level of independence with her B/IADLs and leisure activities. Long Term Goal 2: Patient will decrease pain to 1/10 in her right shoulder when washing dishes or cooking. Long Term Goal 3: Patient will increase AROM to WNL for increased independence pulling up her pants and putting away dishes. Long Term Goal 4: Patient will increase right shoulder strength to 5/5 for increased independence with lifting groceries. Long Term Goal 5: Patient will decrease fascial restrictions from max to min. End of Session Patient Active Problem List  Diagnoses  . Pain in joint, shoulder region  . Adhesive capsulitis of shoulder  . Muscle weakness (generalized)   End of Session Activity Tolerance: Patient tolerated treatment well General Behavior During Session: [facility] for tasks performed Cognition: [facility] for tasks performed   Zyier Dykema L. Jaxson Anglin, COTA/L  11/27/2010, 3:42 PM

## 2010-11-30 ENCOUNTER — Ambulatory Visit (HOSPITAL_COMMUNITY)
Admission: RE | Admit: 2010-11-30 | Discharge: 2010-11-30 | Disposition: A | Discharge status: Home / Self Care | Payer: BC Managed Care – PPO | Source: Ambulatory Visit | Attending: Specialist | Admitting: Specialist

## 2010-11-30 NOTE — Progress Notes (Signed)
Occupational Therapy Treatment  Patient Details  Name: Christina Lewis MRN: [id number] Date of Birth: [birth year]  Today's Date: 11/30/2010 Time: 1300-1350 Time Calculation (min): 50 min Manual Therapy 1300-1320 20' Therapeutic Exercises 1321-1350 29' Visit#: 8  of 25   Re-eval: 12/10/10    Subjective Symptoms/Limitations Symptoms: S:  I dont have any pain at this moment. Pain Assessment Currently in Pain?: No/denies  O:  Exercise/Treatments  11/30/10 0700 Shoulder Exercises: Supine Protraction PROM;Strengthening;10 reps;Weights Protraction Weight (lbs) 1 Horizontal ABduction PROM;Strengthening;10 reps;Weights Horizontal ABduction Weight (lbs) 1 External Rotation PROM;Strengthening;10 reps;Weights External Rotation Weight (lbs) 1 Internal Rotation PROM;Strengthening;10 reps;Weights Internal Rotation Weight (lbs) 1 Flexion PROM;Strengthening;10 reps;Weights Shoulder Flexion Weight (lbs) 1 ABduction PROM;Strengthening;10 reps;Weights Shoulder ABduction Weight (lbs) 1 Shoulder Exercises: Seated Extension Theraband;15 reps (red) Retraction Theraband;15 reps (red) Row Theraband;15 reps (red) Protraction AROM;10 reps Horizontal ABduction AROM;10 reps External Rotation AROM;10 reps Internal Rotation AROM;10 reps Flexion AROM;10 reps Abduction AROM;10 reps Shoulder Exercises: Therapy Ball Flexion 25 reps ABduction 25 reps Right/Left 5 reps Shoulder Exercises: ROM/Strengthening UBE (Upper Arm Bike) 3' and 3' 1.5 Wall Wash 3 mim Thumb Tacks 1 "W" Arms 10 X to V Arms 10 Prot/Ret//Elev/Dep 1 min  Manual Therapy Manual Therapy: Myofascial release Myofascial Release: MFR and manual stretching to right scapular, pectoral, trapezius, and upper arm to decrease pain and increase mobility in her right shoulder.100-120  Occupational Therapy Assessment and Plan OT Assessment and Plan Clinical Impression Statement: A:  Added 1 pound to supine exercises. OT Plan: P:  Add 1  pound to seated exercises and to W arms and x to v exercises.   Goals Short Term Goals Short Term Goal 1: Patient will be educated on a HEP. Short Term Goal 1 Progress: Progressing toward goal Short Term Goal 2: Patient will decrease pain to 3/10 while fastening her bra. Short Term Goal 2 Progress: Progressing toward goal Short Term Goal 3: Patient will increase PROM to [facility] for increased independence fastening her bra. Short Term Goal 3 Progress: Progressing toward goal Short Term Goal 4: Patient will decrease fascial restrictions from max to mod. Short Term Goal 4 Progress: Progressing toward goal Short Term Goal 5: Patient will increase right shoulder strength to 3+/5 for increased ability to complete ADLs. Short Term Goal 5 Progress: Progressing toward goal Long Term Goals Long Term Goal 1: Patient will return to prior level of independence with her B/IADLs and leisure activities. Long Term Goal 1 Progress: Progressing toward goal Long Term Goal 2: Patient will decrease pain to 1/10 in her right shoulder when washing dishes or cooking. Long Term Goal 2 Progress: Progressing toward goal Long Term Goal 3: Patient will increase AROM to WNL for increased independence pulling up her pants and putting away dishes. Long Term Goal 3 Progress: Progressing toward goal Long Term Goal 4: Patient will increase right shoulder strength to 5/5 for increased independence with lifting groceries. Long Term Goal 4 Progress: Progressing toward goal Long Term Goal 5: Patient will decrease fascial restrictions from max to min. Long Term Goal 5 Progress: Progressing toward goal End of Session Patient Active Problem List  Diagnoses  . Pain in joint, shoulder region  . Adhesive capsulitis of shoulder  . Muscle weakness (generalized)   End of Session Activity Tolerance: Patient tolerated treatment well General Behavior During Session: [facility] for tasks performed Cognition: [facility] for tasks  performed   Shirlean Mylar, OTR/L  11/30/2010, 1:49 PM

## 2010-12-01 NOTE — Op Note (Signed)
NAMEMarland Kitchen  CHARLOT, GOUIN               ACCOUNT NO.:  [id number]  MEDICAL RECORD NO.:  [id number]  LOCATION:  5009                         FACILITY:  [facility]  PHYSICIAN:  Vania Rea. Dareld Mcauliffe, M.D.  DATE OF BIRTH:  22-Oct-1955  DATE OF PROCEDURE:  11/12/2010 DATE OF DISCHARGE:                              OPERATIVE REPORT   PREOPERATIVE DIAGNOSIS:  Right shoulder adhesive capsulitis.  POSTOPERATIVE DIAGNOSES: 1. Right shoulder adhesive capsulitis with additional findings of: 2. Subacromial bursitis with impingement. 3. Acromioclavicular joint arthropathy.  PROCEDURE: 1. Right shoulder examination under anesthesia. 2. Right shoulder manipulation under anesthesia. 3. Right shoulder diagnostic arthroscopy. 4. Synovectomy and lysis of adhesions, as well as capsular release     anterosuperior. 5. Debridement of labral tear and excess arthroscopic subacromial     decompression and bursectomy. 6. Arthroscopic distal clavicle resection.  SURGEON:  Vania Rea. Tom Ragsdale, MD  ASSISTANT:  Lucita Lora. Shuford, PA-C  ANESTHESIA:  General endotracheal as well as an interscalene block.  ESTIMATED BLOOD LOSS:  None.  DRAINS:  None.  HISTORY:  Ms. Galligan is a 55 year old female who has had persistent to progressive increasing right shoulder pain as well as profound restrictions in mobility with symptoms refractory to warmth and aggressive attempts at conservative management.  Due to her severe restrictions in mobility, she is brought to the operating room at this time for planned right shoulder arthroscopy as described below.  Preoperatively, I counseled Ms. Dunbar on treatment options as well as risks versus benefits thereof.  Possible surgical complications were reviewed including the potential for bleeding, infection, neurovascular injury, persistent pain, loss of motion, periarticular fracture and/or dislocation and possible need for additional surgery.  She understands and accepts and agrees with  our planned procedure.  PROCEDURE IN DETAIL:  After undergoing routine preop evaluation, the patient received prophylactic antibiotics and an interscalene block was established in the holding area by the Anesthesia Department.  Placed supine on the op table, underwent smooth induction of a general endotracheal anesthesia.  Turned to left lateral decubitus position on beanbag and appropriately padded and protected.  Right shoulder examination under anesthesia showed restriction mobility approximately 120 degrees forward elevation and 10 degrees of external rotation. Manipulation was performed with palpable and audible release of adhesions.  We achieved 180 degrees of abduction and 90 degrees of external rotation.  At this point, the right arm was then suspended at 70 degrees of abduction with 10 pounds of traction.  Right shoulder region was sterilely prepped and draped in standard fashion.  Time-out was called.  Posterior portal was established in glenohumeral joint, and anterior portal was established under direct visualization.  Note, Ralene Bathe, PA-C was surgical assistant throughout this case to assist with positioning of the extremity, operating the arthroscope, passing instruments and intraoperative decision making.  Anterior and posterior portal was established in glenohumeral joint and diagnostic arthroscopy was performed.  The joint space was moderately constricted and showed diffuse synovitis.  We performed a synovectomy with lysis of adhesions and also a capsular release at the rotator interval anterior superiorly.  The rotator cuff was in excellent condition.  The articular surfaces were also in good condition.  There was degenerative  tearing of the superior labrum which we debrided back to stable margin of the base with a shaver.  No biceps tendon instability.  Rotator cuff was found to be in excellent condition.  At this point, hemostasis was then obtained. Fluid and  instrument were removed.  The arm dropped down to 30 degrees abduction with arthroscope introduced into the subacromial space with the posterior portal and a direct lateral port was established in the subacromial space.  Abundant prolific bursal tissue was encountered. This was divided and excised with combination of shaver and a Stryker wand.  Wand was then used for the periosteum from the undersurface of the anterior half of the acromion, then subacromial depression was performed with a bur creating a type 1 morphology.  Port was then established directly anterior to the distal clavicle and distal clavicle resection was performed with a bur.  Care was taken to confirm visualization of the entire circumference of the distal clavicle to ensure adequate removal of the bone.  We then completed the subacromial and subdeltoid bursectomy.  Bursal surface of the rotator cuff was carefully inspected and found to be intact.  Final hemostasis was obtained.  Fluid and instruments were removed.  Portals closed with Monocryl and Steri-Strips.  A bulky dressing was taped at the right shoulder.  Right arm was placed into a sling.  The patient was thenawakened, extubated, and taken to recovery room in stable condition.     Vania Rea. Sherline Eberwein, M.D.     KMS/MEDQ  D:  11/12/2010  T:  11/12/2010  Job:  [id number]  Electronically Signed by Francena Hanly M.D. on 12/01/2010 06:45:00 PM

## 2010-12-02 ENCOUNTER — Ambulatory Visit (HOSPITAL_COMMUNITY)
Admission: RE | Admit: 2010-12-02 | Discharge: 2010-12-02 | Disposition: A | Discharge status: Home / Self Care | Payer: BC Managed Care – PPO | Source: Ambulatory Visit | Attending: Internal Medicine | Admitting: Internal Medicine

## 2010-12-02 NOTE — Progress Notes (Signed)
Occupational Therapy Treatment  Patient Details  Name: Christina Lewis MRN: [id number] Date of Birth: 1955-10-28  Today's Date: 12/02/2010 Time: 7829-5621 Time Calculation (min): 64 min Visit#: 9  of 25   Re-eval: 12/10/10 Manuel Therapy  230-301  31' Therapeutic Exercise  302-334  32'     Subjective Symptoms/Limitations Symptoms: The Rheumatolgist wants me to get work on my upper trap.  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 Pain Assessment Pain Score:   3 Pain Location: Shoulder Pain Orientation: Right Pain Type: Acute pain  Exercise/Treatments Supine Protraction: PROM;10 reps;Strengthening;12 reps Protraction Weight (lbs): 1 Horizontal ABduction: PROM;10 reps;Strengthening;12 reps Horizontal ABduction Weight (lbs): 1 External Rotation: PROM;10 reps;Strengthening;12 reps External Rotation Weight (lbs): 1 Internal Rotation: PROM;10 reps;Strengthening;12 reps Internal Rotation Weight (lbs): 1 Flexion: PROM;10 reps;Strengthening;12 reps Shoulder Flexion Weight (lbs): 1 ABduction: PROM;10 reps;Strengthening;15 reps Shoulder ABduction Weight (lbs): 1 Seated Extension: Theraband;15 reps Theraband Level (Shoulder Extension): Level 2 (Red) Retraction: Theraband;15 reps Theraband Level (Shoulder Retraction): Level 2 (Red) Row: Theraband;15 reps Theraband Level (Shoulder Row): Level 2 (Red) Protraction: AROM;12 reps Horizontal ABduction: AROM;12 reps External Rotation: AROM;12 reps Internal Rotation: AROM;12 reps Flexion: AROM;12 reps Abduction: AROM;12 reps Therapy Ball Flexion: 25 reps ABduction: 25 reps Right/Left: 5 reps ROM / Strengthening / Isometric Strengthening UBE (Upper Arm Bike): 3' and 3' 2.0 Wall Wash: 4' Thumb Tacks: 1 "W" Arms: 10 X to V Arms: 10   Manual Therapy Manual Therapy: Myofascial release Myofascial Release: MFR and manual stretching to right scapular, pectoral, trapezius, and upper arm. Deep tissue massage to subscapularis.  Treatment done to decrease pain and restrictions and increase AROM.  Occupational Therapy Assessment and Plan OT Assessment and Plan Clinical Impression Statement: A:  Increased reps with supine and seated and increased to 4' with wall wash. OT Plan: P:  Add one pound to w arms and x to v.   Goals Short Term Goals Short Term Goal 1: Patient will be educated on a HEP. Short Term Goal 2: Patient will decrease pain to 3/10 while fastening her bra. Short Term Goal 3: Patient will increase PROM to [facility] for increased independence fastening her bra. Short Term Goal 4: Patient will decrease fascial restrictions from max to mod. Short Term Goal 5: Patient will increase right shoulder strength to 3+/5 for increased ability to complete ADLs. Long Term Goals Long Term Goal 1: Patient will return to prior level of independence with her B/IADLs and leisure activities. Long Term Goal 2: Patient will decrease pain to 1/10 in her right shoulder when washing dishes or cooking. Long Term Goal 3: Patient will increase AROM to WNL for increased independence pulling up her pants and putting away dishes. Long Term Goal 4: Patient will increase right shoulder strength to 5/5 for increased independence with lifting groceries. Long Term Goal 5: Patient will decrease fascial restrictions from max to min. End of Session Patient Active Problem List  Diagnoses  . Pain in joint, shoulder region  . Adhesive capsulitis of shoulder  . Muscle weakness (generalized)   End of Session Activity Tolerance: Patient tolerated treatment well   Meagan Ancona L. Tallie Hevia, COTA/L  12/02/2010, 3:34 PM

## 2010-12-04 ENCOUNTER — Ambulatory Visit (HOSPITAL_COMMUNITY)
Admission: RE | Admit: 2010-12-04 | Discharge: 2010-12-04 | Disposition: A | Discharge status: Home / Self Care | Payer: BC Managed Care – PPO | Source: Ambulatory Visit | Attending: Occupational Therapy | Admitting: Occupational Therapy

## 2010-12-04 NOTE — Progress Notes (Signed)
Occupational Therapy Treatment  Patient Details  Name: Christina Lewis MRN: [id number] Date of Birth: [birth year]  Today's Date: 12/04/2010 Time: 1307-1400 Time Calculation (min): 53 min Visit#: 9  of 25   Re-eval: 12/10/10 Manuel Therapy  [phone]  25' Therapeutic exercise 133-200  27'  Subjective Symptoms/Limitations Symptoms: S:  It has been bothering me some today, I think I slept on it wrong. Pain Assessment Currently in Pain?: Yes Pain Score:   3 Pain Location: Shoulder Pain Orientation: Right Pain Type: Acute pain  Exercise/Treatments Supine Protraction: PROM;10 reps;Strengthening;15 reps Protraction Weight (lbs): 1 Horizontal ABduction: PROM;10 reps;Strengthening;15 reps Horizontal ABduction Weight (lbs): 1 External Rotation: PROM;10 reps;Strengthening;15 reps External Rotation Weight (lbs): 1 Internal Rotation: PROM;10 reps;Strengthening;15 reps Internal Rotation Weight (lbs): 1 Flexion: PROM;10 reps;Strengthening;15 reps Shoulder Flexion Weight (lbs): 1 ABduction: PROM;10 reps;Strengthening;15 reps Shoulder ABduction Weight (lbs): 1 Seated Extension: Theraband;15 reps Theraband Level (Shoulder Extension): Level 2 (Red) Retraction: Theraband;15 reps Theraband Level (Shoulder Retraction): Level 2 (Red) Row: Theraband;15 reps Theraband Level (Shoulder Row): Level 2 (Red) Protraction: Strengthening;10 reps Horizontal ABduction: Strengthening;10 reps External Rotation: Strengthening;10 reps Internal Rotation: Strengthening;10 reps Flexion: Strengthening;10 reps Abduction: Strengthening;10 reps Therapy Ball Flexion: 25 reps ABduction: 25 reps Right/Left: 5 reps ROM / Strengthening / Isometric Strengthening UBE (Upper Arm Bike): 3' and 3' 2.5 Wall Wash: 4' Thumb Tacks: 1 "W" Arms: 10x with 1# X to V Arms: 10x with 1# Prot/Ret//Elev/Dep: 1 min   Manual Therapy Manual Therapy: Myofascial release Myofascial Release: MFR and manual stretching to right  scapular, pectoral, trapezius, and upper arm to decrease pain and restrictions and increase pain free range of motion  Occupational Therapy Assessment and Plan OT Assessment and Plan Clinical Impression Statement: A:  Added 1# to seated ex and X to V and W arms. Rehab Potential: Excellent OT Plan: P:  Increase supine to 2#   Goals Short Term Goals Short Term Goal 1: Patient will be educated on a HEP. Short Term Goal 2: Patient will decrease pain to 3/10 while fastening her bra. Short Term Goal 3: Patient will increase PROM to [facility] for increased independence fastening her bra. Short Term Goal 4: Patient will decrease fascial restrictions from max to mod. Short Term Goal 5: Patient will increase right shoulder strength to 3+/5 for increased ability to complete ADLs. Long Term Goals Long Term Goal 1: Patient will return to prior level of independence with her B/IADLs and leisure activities. Long Term Goal 2: Patient will decrease pain to 1/10 in her right shoulder when washing dishes or cooking. Long Term Goal 3: Patient will increase AROM to WNL for increased independence pulling up her pants and putting away dishes. Long Term Goal 4: Patient will increase right shoulder strength to 5/5 for increased independence with lifting groceries. Long Term Goal 5: Patient will decrease fascial restrictions from max to min. End of Session Patient Active Problem List  Diagnoses  . Pain in joint, shoulder region  . Adhesive capsulitis of shoulder  . Muscle weakness (generalized)   End of Session Activity Tolerance: Patient tolerated treatment well   Ramses Klecka L. Lachae Hohler, COTA/L  12/04/2010, 2:01 PM

## 2010-12-07 ENCOUNTER — Ambulatory Visit (HOSPITAL_COMMUNITY): Payer: BC Managed Care – PPO | Admitting: Occupational Therapy

## 2010-12-07 ENCOUNTER — Telehealth (HOSPITAL_COMMUNITY): Payer: Self-pay

## 2010-12-09 ENCOUNTER — Ambulatory Visit (HOSPITAL_COMMUNITY)
Admission: RE | Admit: 2010-12-09 | Discharge: 2010-12-09 | Disposition: A | Discharge status: Home / Self Care | Payer: BC Managed Care – PPO | Source: Ambulatory Visit | Attending: Internal Medicine | Admitting: Internal Medicine

## 2010-12-09 NOTE — Progress Notes (Signed)
Occupational Therapy Treatment  Patient Details  Name: Christina Lewis MRN: [id number] Date of Birth: [birth year]  Today's Date: 12/09/2010 Time: 1478-2956 Time Calculation (min): 56 min Visit#: 10  of 25   Re-eval: 12/10/10 Manuel Therapy  213-086 57' Therapeutic Exercise  131-202 31'   Subjective Symptoms/Limitations Symptoms: S:  I am just really tired. Pain Assessment Currently in Pain?: Yes Pain Score:   1 Pain Location: Shoulder Pain Orientation: Right Pain Type: Acute pain   Exercise/Treatments Supine Protraction: PROM;10 reps;Strengthening Protraction Weight (lbs): 2# Horizontal ABduction: PROM;10 reps;Strengthening Horizontal ABduction Weight (lbs): 2# External Rotation: PROM;10 reps;Strengthening External Rotation Weight (lbs): 2# Internal Rotation: PROM;10 reps;Strengthening Internal Rotation Weight (lbs): 2# Flexion: PROM;10 reps;Strengthening Shoulder Flexion Weight (lbs): 2# ABduction: PROM;10 reps;Strengthening Shoulder ABduction Weight (lbs): 2# Seated Extension:  (d/c to hep) Retraction:  (d/c to hep) Row:  (d/c to hep) Protraction: Strengthening;12 reps Horizontal ABduction: Strengthening;12 reps External Rotation: Strengthening;12 reps Internal Rotation: Strengthening;12 reps Flexion: Strengthening;12 reps Abduction: Strengthening;12 reps Therapy Ball Flexion: 25 reps ABduction: 25 reps Right/Left: 5 reps ROM / Strengthening / Isometric Strengthening UBE (Upper Arm Bike): 3' and 3' 2.5 Wall Wash: 5' Thumb Tacks: 1 "W" Arms: 10x with 1# X to V Arms: 10x with 1#   Manual Therapy Manual Therapy: Myofascial release Myofascial Release: MFR and manual stretching to right scapular, pectoral, trapezius, and upper arm. Deep tissue massage to subscapularis. Treatment done to decrease pain and restrictions and increase AROM  Occupational Therapy Assessment and Plan OT Assessment and Plan Clinical Impression Statement: A:  Increased supine ex  to 2# which patient tolerated well.  D/C'd tband to hep. Rehab Potential: Excellent OT Plan: P:  Reassess   Goals Short Term Goals Short Term Goal 1: Patient will be educated on a HEP. Short Term Goal 2: Patient will decrease pain to 3/10 while fastening her bra. Short Term Goal 3: Patient will increase PROM to [facility] for increased independence fastening her bra. Short Term Goal 4: Patient will decrease fascial restrictions from max to mod. Short Term Goal 5: Patient will increase right shoulder strength to 3+/5 for increased ability to complete ADLs. Long Term Goals Long Term Goal 1: Patient will return to prior level of independence with her B/IADLs and leisure activities. Long Term Goal 2: Patient will decrease pain to 1/10 in her right shoulder when washing dishes or cooking. Long Term Goal 3: Patient will increase AROM to WNL for increased independence pulling up her pants and putting away dishes. Long Term Goal 4: Patient will increase right shoulder strength to 5/5 for increased independence with lifting groceries. Long Term Goal 5: Patient will decrease fascial restrictions from max to min. End of Session Patient Active Problem List  Diagnoses  . Pain in joint, shoulder region  . Adhesive capsulitis of shoulder  . Muscle weakness (generalized)   End of Session Activity Tolerance: Patient tolerated treatment well General Behavior During Session: [facility] for tasks performed Cognition: [facility] for tasks performed   Sanuel Ladnier L. Aleta Manternach, COTA/L  12/09/2010, 2:04 PM

## 2010-12-11 ENCOUNTER — Ambulatory Visit (HOSPITAL_COMMUNITY): Payer: BC Managed Care – PPO | Admitting: Occupational Therapy

## 2010-12-11 ENCOUNTER — Ambulatory Visit (HOSPITAL_COMMUNITY)
Admission: RE | Admit: 2010-12-11 | Discharge: 2010-12-11 | Disposition: A | Discharge status: Home / Self Care | Payer: BC Managed Care – PPO | Source: Ambulatory Visit | Attending: Internal Medicine | Admitting: Internal Medicine

## 2010-12-11 NOTE — Progress Notes (Signed)
Occupational Therapy Treatment  Patient Details  Name: Christina Lewis MRN: [id number] Date of Birth: [birth year]  Today's Date: 12/11/2010 Time: 1120-1220 Time Calculation (min): 60 min Visit#: 11  of 25   Re-eval: 01/08/11 Manuel therapy [id number]  15 Reassess 1120-1130 10' Therapeutic exercise  4782-9562  34' Subjective Symptoms/Limitations Symptoms: S:  I am doing ok Pain Assessment Currently in Pain?: Yes Pain Score:   1 Pain Orientation: Right Pain Type: Acute pain   Exercise/Treatments   12/11/10 1204 Shoulder Exercises: Supine Protraction PROM;10 reps Horizontal ABduction PROM;10 reps External Rotation PROM;10 reps Internal Rotation PROM;10 reps Flexion PROM;10 reps ABduction PROM;10 reps Shoulder Exercises: Seated Protraction Strengthening;12 reps Protraction Weight (lbs) 1# Horizontal ABduction Strengthening;12 reps Horizontal ABduction Weight (lbs) 1# External Rotation Strengthening;12 reps External Rotation Weight (lbs) 1# Internal Rotation Strengthening;12 reps Internal Rotation Weight (lbs) 1# Flexion Strengthening;12 reps Flexion Weight (lbs) 1# Abduction Strengthening;12 reps ABduction Weight (lbs) 1# Shoulder Exercises: Therapy Ball Flexion 25 reps ABduction 25 reps Right/Left 5 reps Shoulder Exercises: ROM/Strengthening UBE (Upper Arm Bike) 3' and 3' 2.5 Wall Wash 2' with 1# Thumb Tacks 1 "W" Arms 10x with 1# X to V Arms 10x with 1# Prot/Ret//Elev/Dep 1 min    Manual Therapy Myofascial Release: MFR and manual stretching to right scapular, trapezius, SCM region to decrease pain and stiffness and increase AROM. Begin manual cervical traction next visit.  Occupational Therapy Assessment and Plan OT Assessment and Plan Clinical Impression Statement: A:  See progress note Rehab Potential: Excellent OT Plan: P:  Continue 3x a week for 4 weeks add manuel cervical traction.   Goals Short Term Goals Short Term Goal 1: Patient will be  educated on a HEP. Short Term Goal 2: Patient will decrease pain to 3/10 while fastening her bra. Short Term Goal 3: Patient will increase PROM to [facility] for increased independence fastening her bra. Short Term Goal 4: Patient will decrease fascial restrictions from max to mod. Short Term Goal 5: Patient will increase right shoulder strength to 3+/5 for increased ability to complete ADLs. Long Term Goals Long Term Goal 1: Patient will return to prior level of independence with her B/IADLs and leisure activities. Long Term Goal 2: Patient will decrease pain to 1/10 in her right shoulder when washing dishes or cooking. Long Term Goal 3: Patient will increase AROM to WNL for increased independence pulling up her pants and putting away dishes. Long Term Goal 4: Patient will increase right shoulder strength to 5/5 for increased independence with lifting groceries. Long Term Goal 5: Patient will decrease fascial restrictions from max to min. End of Session Patient Active Problem List  Diagnoses  . Pain in joint, shoulder region  . Adhesive capsulitis of shoulder  . Muscle weakness (generalized)   General Behavior During Session: [facility] for tasks performed Cognition: [facility] for tasks performed   Rahim Astorga L. Emeline Simpson, COTA/L  12/11/2010, 5:49 PM

## 2010-12-14 ENCOUNTER — Ambulatory Visit (HOSPITAL_COMMUNITY)
Admission: RE | Admit: 2010-12-14 | Discharge: 2010-12-14 | Disposition: A | Discharge status: Home / Self Care | Payer: BC Managed Care – PPO | Source: Ambulatory Visit | Attending: Specialist | Admitting: Specialist

## 2010-12-14 NOTE — Progress Notes (Addendum)
Occupational Therapy Treatment  Patient Details  Name: DOYCE STONEHOUSE MRN: [id number] Date of Birth: 1955-09-19  Today's Date: 12/14/2010 Time: 8657-8469 Time Calculation (min): 55 min Visit#: 12  of 25   Re-eval: 01/08/11 Manuel Therapy  150-220 30' Therapeutic Exercise  221-245  24' Subjective Symptoms/Limitations Symptoms: S:  It has hurt since friday night, I don't know what I did. Pain Assessment Currently in Pain?: Yes Pain Score:   4 Pain Location: Shoulder Pain Orientation: Right Pain Type: Acute pain   Exercise/Treatments Supine Protraction: PROM;10 reps;Strengthening;12 reps Protraction Weight (lbs): 2# Horizontal ABduction: PROM;10 reps;Strengthening;12 reps Horizontal ABduction Weight (lbs): 2# External Rotation: PROM;10 reps;Strengthening;12 reps External Rotation Weight (lbs): 2# Internal Rotation: PROM;10 reps;Strengthening;12 reps Internal Rotation Weight (lbs): 2# Flexion: PROM;10 reps;Strengthening;12 reps Shoulder Flexion Weight (lbs): 2# ABduction: PROM;10 reps;Strengthening;12 reps (with assist) Shoulder ABduction Weight (lbs): 2# Seated Protraction: Strengthening;12 reps Protraction Weight (lbs): 1# Horizontal ABduction: Strengthening;12 reps Horizontal ABduction Weight (lbs): 1# External Rotation: Strengthening;12 reps External Rotation Weight (lbs): 1# Internal Rotation: Strengthening;12 reps Internal Rotation Weight (lbs): 1# Flexion: Strengthening;12 reps Flexion Weight (lbs): 1# Abduction: Strengthening;12 reps ABduction Weight (lbs): 1# Therapy Ball Flexion: 25 reps ABduction: 25 reps Right/Left: 5 reps ROM / Strengthening / Isometric Strengthening UBE (Upper Arm Bike):  (hold per patient request) Wall Wash: hold per patient request Thumb Tacks: hold per patient request "W" Arms: 10x with 1# X to V Arms: 10x with 1# Manual Therapy Manual Therapy: Myofascial release Myofascial Release: MFR and manual stretching to right  scapular, pectoral, trapezius, and upper arm. Deep tissue massage to subscapularis. Treatment done to decrease pain and restrictions and increase AROM  Occupational Therapy Assessment and Plan OT Assessment and Plan Clinical Impression Statement: A:  Added cervical traction.  Multiple restrictions felt in neck and uppertrap.  Pain decreased to 1 or less over 10 after manuel. Rehab Potential: Excellent OT Plan: P:  Resume held exercises if patient tolerates.   Goals Short Term Goals Short Term Goal 1: Patient will be educated on a HEP. Short Term Goal 2: Patient will decrease pain to 3/10 while fastening her bra. Short Term Goal 3: Patient will increase PROM to [facility] for increased independence fastening her bra. Short Term Goal 4: Patient will decrease fascial restrictions from max to mod. Short Term Goal 5: Patient will increase right shoulder strength to 3+/5 for increased ability to complete ADLs. Long Term Goals Long Term Goal 1: Patient will return to prior level of independence with her B/IADLs and leisure activities. Long Term Goal 2: Patient will decrease pain to 1/10 in her right shoulder when washing dishes or cooking. Long Term Goal 3: Patient will increase AROM to WNL for increased independence pulling up her pants and putting away dishes. Long Term Goal 4: Patient will increase right shoulder strength to 5/5 for increased independence with lifting groceries. Long Term Goal 5: Patient will decrease fascial restrictions from max to min. End of Session Patient Active Problem List  Diagnoses  . Pain in joint, shoulder region  . Adhesive capsulitis of shoulder  . Muscle weakness (generalized)   End of Session Activity Tolerance: Patient tolerated treatment well General Behavior During Session: [facility] for tasks performed Cognition: [facility] for tasks performed  Natausha Jungwirth L. Brylin Stopper, COTA/L  12/14/2010, 2:57 PM

## 2010-12-17 ENCOUNTER — Ambulatory Visit (HOSPITAL_COMMUNITY): Payer: BC Managed Care – PPO | Admitting: Specialist

## 2010-12-17 ENCOUNTER — Telehealth (HOSPITAL_COMMUNITY): Payer: Self-pay

## 2010-12-18 ENCOUNTER — Ambulatory Visit (HOSPITAL_COMMUNITY)
Admission: RE | Admit: 2010-12-18 | Discharge: 2010-12-18 | Disposition: A | Discharge status: Home / Self Care | Payer: BC Managed Care – PPO | Source: Ambulatory Visit | Attending: Internal Medicine | Admitting: Internal Medicine

## 2010-12-18 NOTE — Progress Notes (Signed)
Occupational Therapy Treatment  Patient Details  Name: Christina Lewis MRN: [id number] Date of Birth: August 20, 1955  Today's Date: 12/18/2010 Time: 4098-1191 Time Calculation (min): 48 min Manual Therapy 1025-1040 15' Therapeutic Ex 4782-9562 32' Visit#: 13  of 25   Re-eval: 01/08/11    Subjective Symptoms/Limitations Symptoms: S:  The MD was very pleased with my progress, he wants me to continue for a month. Pain Assessment Currently in Pain?: Yes Pain Score:   2 Pain Location: Shoulder Pain Orientation: Right Pain Type: Acute pain  O:  Exercise/Treatments  12/18/10 0700  Shoulder Exercises: Supine  Protraction PROM;10 reps;Strengthening;15 reps  Protraction Weight (lbs) 2#  Horizontal ABduction PROM;10 reps;Strengthening;15 reps  Horizontal ABduction Weight (lbs) 2#  External Rotation PROM;10 reps;Strengthening;15 reps  External Rotation Weight (lbs) 2#  Internal Rotation PROM;10 reps;Strengthening;15 reps  Internal Rotation Weight (lbs) 2#  Flexion PROM;10 reps;Strengthening;15 reps  Shoulder Flexion Weight (lbs) 2#  ABduction PROM;10 reps;Strengthening;15 reps  Shoulder ABduction Weight (lbs) 2#  Shoulder Exercises: Seated  Extension Theraband;15 reps (green)  Retraction Theraband;15 reps (green)  Row Theraband;15 reps (green)  Protraction Strengthening;15 reps  Protraction Weight (lbs) 1#  Horizontal ABduction Strengthening;15 reps  Horizontal ABduction Weight (lbs) 1#  External Rotation Strengthening;Theraband;15 reps (green)  External Rotation Weight (lbs) 1#  Internal Rotation Strengthening;Theraband;15 reps  Internal Rotation Weight (lbs) 1 pound  Flexion Strengthening;15 reps  Flexion Weight (lbs) 1#  Abduction Strengthening;15 reps  ABduction Weight (lbs) 1#  Shoulder Exercises: Therapy Ball  Flexion (dc)  ABduction (dc)  Right/Left (dc)  Shoulder Exercises: ROM/Strengthening  UBE (Upper Arm Bike) 3' and 3' 3.0  Wall Wash 3' with 1#  Thumb  Tacks 1'  "W" Arms 10x with 1#  X to V Arms 10x with 1#  Prot/Ret//Elev/Dep 1'   Manual Therapy Manual Therapy: Myofascial release Myofascial Release: MFR and manual stretching to right scapular, pectoral, trapezius, and upper arm to decrease pain and restrictions and increase AROM. 1308-6578  Occupational Therapy Assessment and Plan OT Assessment and Plan Clinical Impression Statement: A:  Increased reps on strengthening exercises and time on wall wash.  Discussed restrictions that are causing increased pain and decreased mobility with horiz add and int rot.  OT Plan: P:  Increase horiz add and int rot to [facility] for increased ability to complete all daily activities.   Goals Short Term Goals Short Term Goal 1: Patient will be educated on a HEP. Short Term Goal 1 Progress: Progressing toward goal Short Term Goal 2: Patient will decrease pain to 3/10 while fastening her bra. Short Term Goal 2 Progress: Progressing toward goal Short Term Goal 3: Patient will increase PROM to [facility] for increased independence fastening her bra. Short Term Goal 3 Progress: Progressing toward goal Short Term Goal 4: Patient will decrease fascial restrictions from max to mod. Short Term Goal 4 Progress: Progressing toward goal Short Term Goal 5: Patient will increase right shoulder strength to 3+/5 for increased ability to complete ADLs. Short Term Goal 5 Progress: Progressing toward goal Long Term Goals Long Term Goal 1: Patient will return to prior level of independence with her B/IADLs and leisure activities. Long Term Goal 1 Progress: Progressing toward goal Long Term Goal 2: Patient will decrease pain to 1/10 in her right shoulder when washing dishes or cooking. Long Term Goal 2 Progress: Progressing toward goal Long Term Goal 3: Patient will increase AROM to WNL for increased independence pulling up her pants and putting away dishes. Long Term Goal 3 Progress: Progressing  toward goal Long Term Goal 4:  Patient will increase right shoulder strength to 5/5 for increased independence with lifting groceries. Long Term Goal 4 Progress: Progressing toward goal Long Term Goal 5: Patient will decrease fascial restrictions from max to min. Long Term Goal 5 Progress: Progressing toward goal End of Session Patient Active Problem List  Diagnoses  . Pain in joint, shoulder region  . Adhesive capsulitis of shoulder  . Muscle weakness (generalized)   End of Session Activity Tolerance: Patient tolerated treatment well General Behavior During Session: [facility] for tasks performed Cognition: [facility] for tasks performed   Shirlean Mylar, OTR/L  12/18/2010, 11:14 AM

## 2010-12-21 ENCOUNTER — Ambulatory Visit (HOSPITAL_COMMUNITY)
Admission: RE | Admit: 2010-12-21 | Discharge: 2010-12-21 | Disposition: A | Discharge status: Home / Self Care | Payer: BC Managed Care – PPO | Source: Ambulatory Visit | Attending: Specialist | Admitting: Specialist

## 2010-12-21 NOTE — Progress Notes (Signed)
Occupational Therapy Treatment  Patient Details  Name: Christina Lewis MRN: [id number] Date of Birth: Dec 27, 1955  Today's Date: 12/21/2010 Time: 1300-1350 Time Calculation (min): 50 min Manual Therapy 1300-1320 20' Therapeutic Exercises 1321-1330 29' Visit#: 14  of 25   Re-eval: 01/08/11    Subjective Pain Assessment Pain Score:   4 Pain Location: Shoulder Pain Orientation: Right Pain Type: Acute pain  O:  Exercise/Treatments  12/21/10 0700  Shoulder Exercises: Supine  Protraction PROM;10 reps;Strengthening;15 reps  Protraction Weight (lbs) 2#  Horizontal ABduction PROM;10 reps;Strengthening;15 reps  Horizontal ABduction Weight (lbs) 2#  External Rotation PROM;10 reps;Strengthening;15 reps;Theraband (15 reps green)  External Rotation Weight (lbs) 2#  Internal Rotation PROM;10 reps;Strengthening;15 reps;Theraband (15 reps green)  Internal Rotation Weight (lbs) 2#  Flexion PROM;10 reps;Strengthening;15 reps  Shoulder Flexion Weight (lbs) 2#  ABduction PROM;10 reps;Strengthening;15 reps  Shoulder ABduction Weight (lbs) 2#  Shoulder Exercises: Seated  Extension Theraband;15 reps (green)  Retraction Theraband;15 reps (green)  Row Theraband;15 reps (green)  Protraction Strengthening;10 reps  Protraction Weight (lbs) 2#  Horizontal ABduction Strengthening;10 reps  Horizontal ABduction Weight (lbs) 2#  External Rotation Strengthening;10 reps  External Rotation Weight (lbs) 2#  Internal Rotation Strengthening;10 reps  Internal Rotation Weight (lbs) 2#  Flexion Strengthening;10 reps  Flexion Weight (lbs) 2#  Abduction Strengthening;10 reps  ABduction Weight (lbs) 2#  Shoulder Exercises: ROM/Strengthening  UBE (Upper Arm Bike) 3' and 3' 3.0  Cybex Press (begin next visit)  Cybex Row (begin next visit)  Wall Wash 3' with 1#  Thumb Tacks 1'  "W" Arms 10x with 2#  X to V Arms 10 x with 2#  Prot/Ret//Elev/Dep 1'   Manual Therapy Manual Therapy: Myofascial  release Myofascial Release: MFR and manual stretching to right scapular, pectoral, trapezius, and upper arm to decrease pain and restrictions and increase AROM. 4098-1191  Occupational Therapy Assessment and Plan OT Assessment and Plan Clinical Impression Statement: A:  Increased to 2 pounds in seated. OT Plan: P:  Increase to 3# in supine, add cybex press and row.   Goals Short Term Goals Short Term Goal 1: Patient will be educated on a HEP. Short Term Goal 1 Progress: Progressing toward goal Short Term Goal 2: Patient will decrease pain to 3/10 while fastening her bra. Short Term Goal 2 Progress: Progressing toward goal Short Term Goal 3: Patient will increase PROM to [facility] for increased independence fastening her bra. Short Term Goal 3 Progress: Met Short Term Goal 4: Patient will decrease fascial restrictions from max to mod. Short Term Goal 4 Progress: Met Short Term Goal 5: Patient will increase right shoulder strength to 3+/5 for increased ability to complete ADLs. Short Term Goal 5 Progress: Met Long Term Goals Long Term Goal 1: Patient will return to prior level of independence with her B/IADLs and leisure activities. Long Term Goal 2: Patient will decrease pain to 1/10 in her right shoulder when washing dishes or cooking. Long Term Goal 2 Progress: Progressing toward goal Long Term Goal 3: Patient will increase AROM to WNL for increased independence pulling up her pants and putting away dishes. Long Term Goal 3 Progress: Progressing toward goal Long Term Goal 4: Patient will increase right shoulder strength to 5/5 for increased independence with lifting groceries. Long Term Goal 4 Progress: Progressing toward goal Long Term Goal 5: Patient will decrease fascial restrictions from max to min. Long Term Goal 5 Progress: Progressing toward goal End of Session Patient Active Problem List  Diagnoses  . Pain in joint,  shoulder region  . Adhesive capsulitis of shoulder  . Muscle  weakness (generalized)   End of Session Activity Tolerance: Patient tolerated treatment well General Behavior During Session: [facility] for tasks performed Cognition: [facility] for tasks performed   Shirlean Mylar, OTR/L  12/21/2010, 1:50 PM

## 2010-12-23 ENCOUNTER — Ambulatory Visit (HOSPITAL_COMMUNITY)
Admission: RE | Admit: 2010-12-23 | Discharge: 2010-12-23 | Disposition: A | Discharge status: Home / Self Care | Payer: BC Managed Care – PPO | Source: Ambulatory Visit | Attending: Specialist | Admitting: Specialist

## 2010-12-23 NOTE — Progress Notes (Signed)
Occupational Therapy Treatment  Patient Details  Name: Christina Lewis MRN: [id number] Date of Birth: [birth year]  Today's Date: 12/23/2010 Time: 4098-1191 Time Calculation (min): 49 min Manual Therapy 1310-1325 15' Therapeutic Exercises [phone] 80' Visit#: 15  of 25   Re-eval: 01/08/11    Subjective Symptoms/Limitations Symptoms: S:  I am doing ok, it hasnt bothered me too much. Pain Assessment Currently in Pain?: Yes Pain Score:   2 Pain Location: Shoulder Pain Orientation: Right Pain Type: Acute pain  O:  Exercise/Treatments    12/23/10 0700  Shoulder Exercises: Supine  Protraction PROM;Strengthening;10 reps  Protraction Weight (lbs) 3  Horizontal ABduction PROM;Strengthening;10 reps  Horizontal ABduction Weight (lbs) 3  External Rotation PROM;Strengthening;10 reps  External Rotation Weight (lbs) 3  Internal Rotation PROM;Strengthening  Internal Rotation Weight (lbs) 3  Flexion PROM;Strengthening;10 reps  Shoulder Flexion Weight (lbs) 3  ABduction PROM;Strengthening;10 reps  Shoulder ABduction Weight (lbs) 3  Shoulder Exercises: Seated  Extension Theraband;15 reps (green)  Retraction Theraband;12 reps (green)  Row Theraband;15 reps (green)  Protraction Strengthening;15 reps  Protraction Weight (lbs) 2  Horizontal ABduction Strengthening;15 reps  Horizontal ABduction Weight (lbs) 2  External Rotation Strengthening;15 reps  External Rotation Weight (lbs) 2  Internal Rotation Strengthening;15 reps  Internal Rotation Weight (lbs) 2  Flexion Strengthening;15 reps  Flexion Weight (lbs) 2  Abduction Strengthening;10 reps  ABduction Weight (lbs) 2  Shoulder Exercises: ROM/Strengthening  UBE (Upper Arm Bike) 3' and 3' 3.5  Cybex Press 1.5 plate;15 reps  Cybex Row 1.5 plate;15 reps  Wall Wash 4' wtih 1#  Thumb Tacks 1'  "W" Arms 15 x with 2#  X to V Arms 15 x 2#  Prot/Ret//Elev/Dep 1'   Manual Therapy Myofascial Release: MFR and manual stretching to  right scapular, pectoral, trapezius, and upper arm to decrease pain and restrictions and increase AROM.  1308-657  Occupational Therapy Assessment and Plan OT Assessment and Plan Clinical Impression Statement: A:  Increased to 3# with supine strengthening. OT Plan: P:  Increase time on wall wash and attempt with 2#.   Goals Short Term Goals Short Term Goal 1: Patient will be educated on a HEP. Short Term Goal 2: Patient will decrease pain to 3/10 while fastening her bra. Short Term Goal 3: Patient will increase PROM to [facility] for increased independence fastening her bra. Short Term Goal 4: Patient will decrease fascial restrictions from max to mod. Short Term Goal 5: Patient will increase right shoulder strength to 3+/5 for increased ability to complete ADLs. Long Term Goals Long Term Goal 1: Patient will return to prior level of independence with her B/IADLs and leisure activities. Long Term Goal 2: Patient will decrease pain to 1/10 in her right shoulder when washing dishes or cooking. Long Term Goal 3: Patient will increase AROM to WNL for increased independence pulling up her pants and putting away dishes. Long Term Goal 4: Patient will increase right shoulder strength to 5/5 for increased independence with lifting groceries. Long Term Goal 5: Patient will decrease fascial restrictions from max to min. End of Session Patient Active Problem List  Diagnoses  . Pain in joint, shoulder region  . Adhesive capsulitis of shoulder  . Muscle weakness (generalized)   End of Session Activity Tolerance: Patient tolerated treatment well General Behavior During Session: [facility] for tasks performed Cognition: [facility] for tasks performed   Shirlean Mylar, OTR/L  12/23/2010, 2:53 PM

## 2010-12-25 ENCOUNTER — Ambulatory Visit (HOSPITAL_COMMUNITY)
Admission: RE | Admit: 2010-12-25 | Discharge: 2010-12-25 | Disposition: A | Discharge status: Home / Self Care | Payer: BC Managed Care – PPO | Source: Ambulatory Visit | Attending: Orthopedic Surgery | Admitting: Orthopedic Surgery

## 2010-12-25 DIAGNOSIS — M25619 Stiffness of unspecified shoulder, not elsewhere classified: Secondary | ICD-10-CM | POA: Insufficient documentation

## 2010-12-25 DIAGNOSIS — M6281 Muscle weakness (generalized): Secondary | ICD-10-CM | POA: Insufficient documentation

## 2010-12-25 DIAGNOSIS — M25519 Pain in unspecified shoulder: Secondary | ICD-10-CM | POA: Insufficient documentation

## 2010-12-25 DIAGNOSIS — IMO0001 Reserved for inherently not codable concepts without codable children: Secondary | ICD-10-CM | POA: Insufficient documentation

## 2010-12-25 NOTE — Progress Notes (Signed)
Occupational Therapy Treatment  Patient Details  Name: Christina Lewis MRN: [id number] Date of Birth: March 20, 1955  Today's Date: 12/25/2010 Time: 4098-1191 Time Calculation (min): 41 min Visit#: 16  of 26   Re-eval: 01/08/11 Manuel Therapy  478-295  13' Therapeutic Exercise  206-233  27'    Subjective Symptoms/Limitations Symptoms: S:  I think this is the first time my pain has been a zero. I have not had pain pills for 2 days. Pain Assessment Pain Score: 0-No pain  Exercise/Treatments Supine Protraction: PROM;Strengthening;12 reps Protraction Weight (lbs): 3 Horizontal ABduction: PROM;Strengthening;12 reps Horizontal ABduction Weight (lbs): 3 External Rotation: PROM;Strengthening;12 reps External Rotation Weight (lbs): 3 Internal Rotation: PROM;Strengthening;12 reps Internal Rotation Weight (lbs): 3 Flexion: PROM;Strengthening;12 reps Shoulder Flexion Weight (lbs): 3 ABduction: PROM;Strengthening;12 reps Shoulder ABduction Weight (lbs): 3 Seated Protraction: Strengthening;15 reps Protraction Weight (lbs): 2 Horizontal ABduction: Strengthening;15 reps Horizontal ABduction Weight (lbs): 2 External Rotation: Strengthening;15 reps External Rotation Weight (lbs): 2 Internal Rotation: Strengthening;15 reps Internal Rotation Weight (lbs): 2 Flexion: Strengthening;15 reps Flexion Weight (lbs): 2 Abduction: Strengthening;15 reps    ROM / Strengthening / Isometric Strengthening UBE (Upper Arm Bike): 3' and 3' 3.5 Cybex Press: 2 plate;10 reps Cybex Row: 2 plate;10 reps Wall Wash: 2' with 3# Thumb Tacks: 1' "W" Arms: 15 x with 2# X to V Arms: 15 x 2# Prot/Ret//Elev/Dep: 1'  Myofascial Release MFR and manual stretching to right scapular, pectoral, trapezius, and upper arm. Deep tissue massage to subscapularis. Treatment done to decrease pain and restrictions and increase AROM.  Occupational Therapy Assessment and Plan OT Assessment and Plan Clinical Impression  Statement: A:  Increased wall wash to 2# and increased to 2 plates on cybex press and row. Rehab Potential: Excellent OT Plan: P:  Increase seated exercises to 2#   Goals Short Term Goals Short Term Goal 1: Patient will be educated on a HEP. Short Term Goal 2: Patient will decrease pain to 3/10 while fastening her bra. Short Term Goal 3: Patient will increase PROM to [facility] for increased independence fastening her bra. Short Term Goal 4: Patient will decrease fascial restrictions from max to mod. Short Term Goal 5: Patient will increase right shoulder strength to 3+/5 for increased ability to complete ADLs. Long Term Goals Long Term Goal 1: Patient will return to prior level of independence with her B/IADLs and leisure activities. Long Term Goal 2: Patient will decrease pain to 1/10 in her right shoulder when washing dishes or cooking. Long Term Goal 3: Patient will increase AROM to WNL for increased independence pulling up her pants and putting away dishes. Long Term Goal 4: Patient will increase right shoulder strength to 5/5 for increased independence with lifting groceries. Long Term Goal 5: Patient will decrease fascial restrictions from max to min. End of Session Patient Active Problem List  Diagnoses  . Pain in joint, shoulder region  . Adhesive capsulitis of shoulder  . Muscle weakness (generalized)   End of Session Activity Tolerance: Patient tolerated treatment well General Behavior During Session: [facility] for tasks performed Cognition: [facility] for tasks performed   Adlynn Lowenstein L. Noralee Stain, COTA/L  12/25/2010, 2:39 PM

## 2010-12-28 ENCOUNTER — Ambulatory Visit (HOSPITAL_COMMUNITY)
Admission: RE | Admit: 2010-12-28 | Discharge: 2010-12-28 | Disposition: A | Discharge status: Home / Self Care | Payer: BC Managed Care – PPO | Source: Ambulatory Visit | Attending: Internal Medicine | Admitting: Internal Medicine

## 2010-12-28 ENCOUNTER — Other Ambulatory Visit (HOSPITAL_COMMUNITY): Payer: Self-pay | Admitting: Otolaryngology

## 2010-12-28 DIAGNOSIS — E041 Nontoxic single thyroid nodule: Secondary | ICD-10-CM

## 2010-12-28 NOTE — Progress Notes (Signed)
Occupational Therapy Treatment  Patient Details  Name: Christina Lewis MRN: [id number] Date of Birth: [birth year]  Today's Date: 12/28/2010 Time: 4098-1191 Time Calculation (min): 48 min Manual Therapy 4782-9562 15' Therapeutic Exercises 1308-6578 32' Visit#: 17  of 26   Re-eval: 01/08/11    Subjective Symptoms/Limitations Symptoms: S:  My pain is still a zero! Pain Assessment Currently in Pain?: No/denies   Exercise/Treatments  12/28/10 0700 Shoulder Exercises: Supine Protraction PROM;10 reps;Strengthening;15 reps Protraction Weight (lbs) 3 Horizontal ABduction PROM;10 reps;Strengthening;15 reps Horizontal ABduction Weight (lbs) 3 External Rotation PROM;5 reps;Strengthening;15 reps External Rotation Weight (lbs) 3 Internal Rotation PROM;10 reps;Strengthening;15 reps Internal Rotation Weight (lbs) 3 Flexion PROM;10 reps;Strengthening;15 reps Shoulder Flexion Weight (lbs) 3 ABduction PROM;10 reps;Strengthening;15 reps Shoulder ABduction Weight (lbs) 3 Shoulder Exercises: Seated Extension Theraband;10 reps (blue) Retraction Theraband;10 reps (blue) Row Theraband;10 reps (blue) Protraction Strengthening;10 reps Protraction Weight (lbs) 3 Horizontal ABduction Strengthening;10 reps Horizontal ABduction Weight (lbs) 3 External Rotation Strengthening;10 reps;Theraband (blue 10) External Rotation Weight (lbs) 3 Internal Rotation Strengthening;10 reps;Theraband (blue 10) Internal Rotation Weight (lbs) 3 Flexion Strengthening;10 reps Flexion Weight (lbs) 3 Abduction Strengthening;10 reps ABduction Weight (lbs) 3 Shoulder Exercises: ROM/Strengthening UBE (Upper Arm Bike) 3' and 3' 4.0 Cybex Press 2 plate;20 reps Cybex Row 2 plate;20 reps Wall Wash 3' with 3# Thumb Tacks 1' "W" Arms 10 x with 3# X to V Arms 10 x with 3# Prot/Ret//Elev/Dep 1'  Manual Therapy Manual Therapy: Myofascial release Myofascial Release: MFR and manual stretching to right scapular,  pectoral, trapezius, and upper arm to decrease pain and increase mobility.  4696-2952  Occupational Therapy Assessment and Plan OT Assessment and Plan Clinical Impression Statement: A:  Internal Rotation is most difficult movement.  Added slow gradual stretch for IR in seated this date. OT Plan: P:  Increase IR to [facility] for fastening bra, dc tband.   Goals Short Term Goals Short Term Goal 1: Patient will be educated on a HEP. Short Term Goal 1 Progress: Met Short Term Goal 2: Patient will decrease pain to 3/10 while fastening her bra. Short Term Goal 2 Progress: Met Short Term Goal 3: Patient will increase PROM to [facility] for increased independence fastening her bra. Short Term Goal 3 Progress: Met Short Term Goal 4: Patient will decrease fascial restrictions from max to mod. Short Term Goal 4 Progress: Met Short Term Goal 5: Patient will increase right shoulder strength to 3+/5 for increased ability to complete ADLs. Short Term Goal 5 Progress: Met Long Term Goals Long Term Goal 1: Patient will return to prior level of independence with her B/IADLs and leisure activities. Long Term Goal 1 Progress: Progressing toward goal Long Term Goal 2: Patient will decrease pain to 1/10 in her right shoulder when washing dishes or cooking. Long Term Goal 2 Progress: Met Long Term Goal 3: Patient will increase AROM to WNL for increased independence pulling up her pants and putting away dishes. Long Term Goal 3 Progress: Progressing toward goal Long Term Goal 4: Patient will increase right shoulder strength to 5/5 for increased independence with lifting groceries. Long Term Goal 4 Progress: Progressing toward goal Long Term Goal 5: Patient will decrease fascial restrictions from max to min. Long Term Goal 5 Progress: Progressing toward goal End of Session Patient Active Problem List  Diagnoses  . Pain in joint, shoulder region  . Adhesive capsulitis of shoulder  . Muscle weakness (generalized)   End  of Session Activity Tolerance: Patient tolerated treatment well General Behavior During Session: [facility] for  tasks performed Cognition: [facility] for tasks performed   Shirlean Mylar, OTR/L  12/28/2010, 1:56 PM

## 2010-12-30 ENCOUNTER — Ambulatory Visit (HOSPITAL_COMMUNITY)
Admission: RE | Admit: 2010-12-30 | Discharge: 2010-12-30 | Disposition: A | Discharge status: Home / Self Care | Payer: BC Managed Care – PPO | Source: Ambulatory Visit | Attending: Internal Medicine | Admitting: Internal Medicine

## 2010-12-30 NOTE — Progress Notes (Signed)
Occupational Therapy Treatment  Patient Details  Name: ANARIA KRONER MRN: [id number] Date of Birth: 1955/08/25  Today's Date: 12/30/2010 Time: 7829-5621 Time Calculation (min): 43 min Visit#: 18  of 26   Re-eval: 01/08/11 Manuel Therapy  [phone] 39'  (completed by Leia Alf, OTR/L) Therapeutic Exercise  125-144  19'    Subjective Symptoms/Limitations Symptoms: S:  It has been bothering me today. Pain Assessment Currently in Pain?: Yes Pain Score:   2 Pain Location: Shoulder Pain Orientation: Right Pain Type: Acute pain   Exercise/Treatments Supine Protraction: PROM;10 reps Horizontal ABduction: PROM;10 reps External Rotation: PROM;10 reps Internal Rotation: PROM;10 reps Flexion: PROM;10 reps ABduction: PROM;10 reps Seated Protraction: Strengthening;15 reps Protraction Weight (lbs): 3 Horizontal ABduction: Strengthening;15 reps Horizontal ABduction Weight (lbs): 3 External Rotation: Strengthening;Theraband;15 reps External Rotation Weight (lbs): 3 Internal Rotation: Strengthening;Theraband;15 reps Internal Rotation Weight (lbs): 3 Flexion: Strengthening;15 reps Flexion Weight (lbs): 3 Abduction: Strengthening;15 reps ABduction Weight (lbs): 3 ROM / Strengthening / Isometric Strengthening UBE (Upper Arm Bike): 3' and 3' 4.0 Cybex Press: 2 plate;20 reps Cybex Row: 2 plate;20 reps Wall Wash: 3' with 3# Thumb Tacks: 1' Over Head Lace: c "W" Arms: 10 x with 3# X to V Arms: 10 x with 3# Prot/Ret//Elev/Dep: 1'  Manual Therapy Manual Therapy: Myofascial release Myofascial Release: MFR and manual stretching to right scapular, pectoral, trapezius, and upper arm to decrease pain and increase mobility.  Gradual stretch into internal rotation in seated holding each position x 5 seconds.  8469-6295   Occupational Therapy Assessment and Plan OT Assessment and Plan Clinical Impression Statement: A:  D/C'd tband to HEP. OT Plan: P:  Continue to increase PROM and  strength.   Goals Short Term Goals Short Term Goal 1: Patient will be educated on a HEP. Short Term Goal 2: Patient will decrease pain to 3/10 while fastening her bra. Short Term Goal 3: Patient will increase PROM to [facility] for increased independence fastening her bra. Short Term Goal 4: Patient will decrease fascial restrictions from max to mod. Short Term Goal 5: Patient will increase right shoulder strength to 3+/5 for increased ability to complete ADLs. Long Term Goals Long Term Goal 1: Patient will return to prior level of independence with her B/IADLs and leisure activities. Long Term Goal 2: Patient will decrease pain to 1/10 in her right shoulder when washing dishes or cooking. Long Term Goal 3: Patient will increase AROM to WNL for increased independence pulling up her pants and putting away dishes. Long Term Goal 4: Patient will increase right shoulder strength to 5/5 for increased independence with lifting groceries. Long Term Goal 5: Patient will decrease fascial restrictions from max to min. End of Session Patient Active Problem List  Diagnoses  . Pain in joint, shoulder region  . Adhesive capsulitis of shoulder  . Muscle weakness (generalized)   End of Session Activity Tolerance: Patient tolerated treatment well General Behavior During Session: [facility] for tasks performed Cognition: [facility] for tasks performed   Krystale Rinkenberger L. Chevette Fee, COTA/L  12/30/2010, 1:47 PM

## 2011-01-01 ENCOUNTER — Ambulatory Visit (HOSPITAL_COMMUNITY)
Admission: RE | Admit: 2011-01-01 | Discharge: 2011-01-01 | Disposition: A | Discharge status: Home / Self Care | Payer: BC Managed Care – PPO | Source: Ambulatory Visit | Attending: Otolaryngology | Admitting: Otolaryngology

## 2011-01-01 ENCOUNTER — Other Ambulatory Visit (HOSPITAL_COMMUNITY): Payer: BC Managed Care – PPO

## 2011-01-01 ENCOUNTER — Other Ambulatory Visit (HOSPITAL_COMMUNITY): Payer: Self-pay | Admitting: Otolaryngology

## 2011-01-01 ENCOUNTER — Ambulatory Visit (HOSPITAL_COMMUNITY): Payer: BC Managed Care – PPO | Admitting: Occupational Therapy

## 2011-01-01 ENCOUNTER — Inpatient Hospital Stay (HOSPITAL_COMMUNITY): Admission: RE | Admit: 2011-01-01 | Discharge: 2011-01-01 | Payer: BC Managed Care – PPO | Source: Ambulatory Visit

## 2011-01-01 DIAGNOSIS — E041 Nontoxic single thyroid nodule: Secondary | ICD-10-CM

## 2011-01-01 DIAGNOSIS — E049 Nontoxic goiter, unspecified: Secondary | ICD-10-CM | POA: Insufficient documentation

## 2011-01-01 NOTE — Procedures (Signed)
PreOperative Dx: LEFT thyroid nodule x 2 Postoperative Dx: LEFT thyroid nodule x 2 Procedure:   US guided FNA LEFT thyroid nodule x 2 Radiologist:  Tyron Russell Anesthesia:  3 ml of 2% lidocaine Specimen:  FNA x 3 of each nodule EBL:   <1 ml Complications: None

## 2011-01-04 ENCOUNTER — Ambulatory Visit (HOSPITAL_COMMUNITY): Payer: BC Managed Care – PPO | Admitting: Specialist

## 2011-01-04 ENCOUNTER — Telehealth (HOSPITAL_COMMUNITY): Payer: Self-pay

## 2011-01-06 ENCOUNTER — Ambulatory Visit (HOSPITAL_COMMUNITY)
Admission: RE | Admit: 2011-01-06 | Discharge: 2011-01-06 | Disposition: A | Discharge status: Home / Self Care | Payer: BC Managed Care – PPO | Source: Ambulatory Visit | Attending: Internal Medicine | Admitting: Internal Medicine

## 2011-01-06 NOTE — Progress Notes (Signed)
Occupational Therapy Treatment  Patient Details  Name: Christina Lewis MRN: [id number] Date of Birth: 1955/03/15  Today's Date: 01/06/2011 Time: 6962-9528 Time Calculation (min): 48 min Manual Therapy 4132-4401 24' Therapeutic Exercises 0272-5366 23' Visit#: 19  of 26   Re-eval: 01/08/11    Subjective Symptoms/Limitations Symptoms: I dont have any complaints. Pain Assessment Currently in Pain?: No/denies Pain Score: 0-No pain   O:  Exercise/Treatments Supine Protraction: PROM;10 reps Horizontal ABduction: PROM;10 reps External Rotation: PROM;10 reps Internal Rotation: PROM;10 reps Flexion: PROM;10 reps ABduction: PROM;10 reps Seated Extension: Strengthening;15 reps Extension Weight (lbs): 3 Protraction: Strengthening;15 reps Protraction Weight (lbs): 3 Horizontal ABduction: Strengthening;15 reps Horizontal ABduction Weight (lbs): 3 External Rotation: Strengthening;15 reps External Rotation Weight (lbs): 3 Internal Rotation: Strengthening;15 reps Internal Rotation Weight (lbs): 3 Flexion: Strengthening;15 reps Flexion Weight (lbs): 3 Abduction: Strengthening;15 reps ABduction Weight (lbs): 3   ROM / Strengthening / Isometric Strengthening UBE (Upper Arm Bike): 3' and 3' 4.0 Cybex Press: 2 plate;25 reps Cybex Row: 2 plate;25 reps Wall Wash: 4' wtih 3# Thumb Tacks: 1' "W" Arms: 15 x wtih 3# X to V Arms: 15 x wtih 3# Prot/Ret//Elev/Dep: 1'   Manual Therapy Manual Therapy: Myofascial release Myofascial Release: MFR and manual stretching to right scapular, pectoral, trapezius, and upper arm to decrease pain and increase mobility.  Gradual stretch into internal rotation in seated holding each position x 5 seconds.106-130  Occupational Therapy Assessment and Plan OT Assessment and Plan Clinical Impression Statement: A:  Increased reps with cybex and increased time with wall wash. OT Plan: P:  REASSESS   Goals Short Term Goals Short Term Goal 1: Patient will  be educated on a HEP. Short Term Goal 2: Patient will decrease pain to 3/10 while fastening her bra. Short Term Goal 3: Patient will increase PROM to [facility] for increased independence fastening her bra. Short Term Goal 4: Patient will decrease fascial restrictions from max to mod. Short Term Goal 5: Patient will increase right shoulder strength to 3+/5 for increased ability to complete ADLs. Long Term Goals Long Term Goal 1: Patient will return to prior level of independence with her B/IADLs and leisure activities. Long Term Goal 2: Patient will decrease pain to 1/10 in her right shoulder when washing dishes or cooking. Long Term Goal 3: Patient will increase AROM to WNL for increased independence pulling up her pants and putting away dishes. Long Term Goal 4: Patient will increase right shoulder strength to 5/5 for increased independence with lifting groceries. Long Term Goal 5: Patient will decrease fascial restrictions from max to min. End of Session Patient Active Problem List  Diagnoses  . Pain in joint, shoulder region  . Adhesive capsulitis of shoulder  . Muscle weakness (generalized)   End of Session Activity Tolerance: Patient tolerated treatment well General Behavior During Session: [facility] for tasks performed Cognition: [facility] for tasks performed   Shirlean Mylar, OTR/L  01/06/2011, 2:20 PM

## 2011-01-07 ENCOUNTER — Ambulatory Visit (HOSPITAL_COMMUNITY)
Admission: RE | Admit: 2011-01-07 | Discharge: 2011-01-07 | Disposition: A | Discharge status: Home / Self Care | Payer: BC Managed Care – PPO | Source: Ambulatory Visit | Attending: Internal Medicine | Admitting: Internal Medicine

## 2011-01-07 NOTE — Progress Notes (Signed)
Occupational Therapy Treatment  Patient Details  Name: Christina Lewis MRN: [id number] Date of Birth: 1955/11/26  Today's Date: 01/07/2011 Time: 4098-1191 Time Calculation (min): 24 min Manual Therapy 4782-9562 Visit#: 20  of 26   Re-eval: 01/08/11    Subjective Symptoms/Limitations Symptoms: S:  I am doing great.  I realize it will take time to increase my ability to reach my mid back.  It's ok, it isnt affecting my daily activities at all. Pain Assessment Currently in Pain?: No/denies Pain Score: 0-No pain  Exercise/Treatments Manual Therapy Manual Therapy: Myofascial release Myofascial Release: MFR and manual stretching to right scapular, pectoral, trapezius, and upper arm to decrease pain and increase mobility.  109/128  Occupational Therapy Assessment and Plan OT Assessment and Plan Clinical Impression Statement: A:  Please see dc summary. OT Plan: P:  DC, all goals met.   Goals Short Term Goals Short Term Goal 1: Patient will be educated on a HEP. Short Term Goal 2: Patient will decrease pain to 3/10 while fastening her bra. Short Term Goal 3: Patient will increase PROM to [facility] for increased independence fastening her bra. Short Term Goal 4: Patient will decrease fascial restrictions from max to mod. Short Term Goal 5: Patient will increase right shoulder strength to 3+/5 for increased ability to complete ADLs. Long Term Goals Long Term Goal 1: Patient will return to prior level of independence with her B/IADLs and leisure activities. Long Term Goal 1 Progress: Met Long Term Goal 2: Patient will decrease pain to 1/10 in her right shoulder when washing dishes or cooking. Long Term Goal 2 Progress: Met Long Term Goal 3: Patient will increase AROM to WNL for increased independence pulling up her pants and putting away dishes. Long Term Goal 3 Progress: Met Long Term Goal 4: Patient will increase right shoulder strength to 5/5 for increased independence with lifting  groceries. Long Term Goal 4 Progress: Met Long Term Goal 5: Patient will decrease fascial restrictions from max to min. Long Term Goal 5 Progress: Met End of Session Patient Active Problem List  Diagnoses  . Pain in joint, shoulder region  . Adhesive capsulitis of shoulder  . Muscle weakness (generalized)   End of Session Activity Tolerance: Patient tolerated treatment well General Behavior During Session: [facility] for tasks performed Cognition: [facility] for tasks performed   Shirlean Mylar, OTR/L  01/07/2011, 2:10 PM

## 2011-03-18 ENCOUNTER — Other Ambulatory Visit: Payer: Self-pay | Admitting: Endocrinology

## 2011-03-18 DIAGNOSIS — E049 Nontoxic goiter, unspecified: Secondary | ICD-10-CM

## 2011-05-31 ENCOUNTER — Other Ambulatory Visit: Payer: BC Managed Care – PPO

## 2011-09-18 ENCOUNTER — Emergency Department (HOSPITAL_COMMUNITY)
Admission: EM | Admit: 2011-09-18 | Discharge: 2011-09-19 | Disposition: A | Discharge status: Home / Self Care | Payer: Self-pay | Attending: Emergency Medicine | Admitting: Emergency Medicine

## 2011-09-18 ENCOUNTER — Encounter (HOSPITAL_COMMUNITY): Payer: Self-pay | Admitting: Emergency Medicine

## 2011-09-18 ENCOUNTER — Emergency Department (HOSPITAL_COMMUNITY): Payer: Self-pay

## 2011-09-18 DIAGNOSIS — IMO0001 Reserved for inherently not codable concepts without codable children: Secondary | ICD-10-CM | POA: Insufficient documentation

## 2011-09-18 DIAGNOSIS — Z9089 Acquired absence of other organs: Secondary | ICD-10-CM | POA: Insufficient documentation

## 2011-09-18 DIAGNOSIS — E119 Type 2 diabetes mellitus without complications: Secondary | ICD-10-CM | POA: Insufficient documentation

## 2011-09-18 DIAGNOSIS — Z79899 Other long term (current) drug therapy: Secondary | ICD-10-CM | POA: Insufficient documentation

## 2011-09-18 DIAGNOSIS — A09 Infectious gastroenteritis and colitis, unspecified: Secondary | ICD-10-CM | POA: Insufficient documentation

## 2011-09-18 DIAGNOSIS — G4730 Sleep apnea, unspecified: Secondary | ICD-10-CM | POA: Insufficient documentation

## 2011-09-18 DIAGNOSIS — Z8739 Personal history of other diseases of the musculoskeletal system and connective tissue: Secondary | ICD-10-CM | POA: Insufficient documentation

## 2011-09-18 HISTORY — DX: Migraine, unspecified, not intractable, without status migrainosus: G43.909

## 2011-09-18 HISTORY — DX: Unspecified osteoarthritis, unspecified site: M19.90

## 2011-09-18 HISTORY — DX: Gout, unspecified: M10.9

## 2011-09-18 HISTORY — DX: Sleep apnea, unspecified: G47.30

## 2011-09-18 HISTORY — DX: Fibromyalgia: M79.7

## 2011-09-18 LAB — LIPASE, BLOOD: Lipase: 46 U/L (ref 11–59)

## 2011-09-18 LAB — COMPREHENSIVE METABOLIC PANEL
ALT: 35 U/L (ref 0–35)
Albumin: 4 g/dL (ref 3.5–5.2)
Alkaline Phosphatase: 82 U/L (ref 39–117)
BUN: 11 mg/dL (ref 6–23)
Chloride: 105 mEq/L (ref 96–112)
Potassium: 3.7 mEq/L (ref 3.5–5.1)
Sodium: 139 mEq/L (ref 135–145)
Total Bilirubin: 0.2 mg/dL — ABNORMAL LOW (ref 0.3–1.2)
Total Protein: 7.2 g/dL (ref 6.0–8.3)

## 2011-09-18 LAB — URINALYSIS, ROUTINE W REFLEX MICROSCOPIC
Bilirubin Urine: NEGATIVE
Glucose, UA: NEGATIVE mg/dL
Ketones, ur: NEGATIVE mg/dL
Nitrite: NEGATIVE
Specific Gravity, Urine: 1.025 (ref 1.005–1.030)
pH: 6 (ref 5.0–8.0)

## 2011-09-18 LAB — URINE MICROSCOPIC-ADD ON

## 2011-09-18 MED ORDER — IOHEXOL 300 MG/ML  SOLN: 40.0000 mL | Freq: Once | INTRAMUSCULAR | Status: AC | PRN

## 2011-09-18 MED ORDER — SODIUM CHLORIDE 0.9 % IV SOLN
Freq: Once | INTRAVENOUS | Status: AC
  Administered 2011-09-18: 22:00:00 via INTRAVENOUS

## 2011-09-18 MED ORDER — IOHEXOL 300 MG/ML  SOLN
100.0000 mL | Freq: Once | INTRAMUSCULAR | Status: AC | PRN
  Administered 2011-09-18: 100 mL via INTRAVENOUS

## 2011-09-18 NOTE — ED Notes (Signed)
Patient reports that she ate out for dinner on Monday and that night started vomiting and having diarrhea. Reports vomiting and diarrhea all week. States that she started having bright red bloody loose stools today.

## 2011-09-18 NOTE — ED Notes (Signed)
Pt is starting to shake at this time a little . Christina Lewis

## 2011-09-18 NOTE — ED Provider Notes (Signed)
History   This chart was scribed for Carleene Cooper III, MD by Sofie Rower. The patient was seen in room APA10/APA10 and the patient's care was started at 9:29 PM     CSN: [id number]  Arrival date & time 09/18/11  2040   First MD Initiated Contact with Patient 09/18/11 2125      Chief Complaint  Patient presents with  . Diarrhea  . Rectal Bleeding    (Consider location/radiation/quality/duration/timing/severity/associated sxs/prior treatment) HPI  Christina Lewis is a 56 y.o. female who presents to the Emergency Department complaining of moderate, episodic diarrhea (multiple episodes) onset four days ago with associated symptoms of vomiting, convulsions, chills, headache, cough. The pt informs that today she went to the bathroom, where her stool was loose and filled with bright red blood. Her stools were loose and filled with blood three times today. Pt has a hx of diabetes (oral metformin), appendectomy, cholecystectomy, oophorectomy, abdominal hysterectomy, breast lumpectomy, allergy to sulfa drugs.   Pt denies fever, earache, sore throat, chest pain, difficulty urinating, rash, LOC, seizures, hematemesis.    Pt does not smoke, pt does not drink.   PCP is Dr. Sherryll Burger.    Past Medical History  Diagnosis Date  . Fibromyalgia   . Diabetes mellitus   . Arthritis   . Sleep apnea   . Gout   . Osteoarthritis   . Migraines     Past Surgical History  Procedure Date  . Tonsillectomy   . Appendectomy   . Cholecystectomy   . Breast lumpectomy   . Shoulder surgery     right shoulder  . Abdominal hysterectomy   . Oophorectomy     History reviewed. No pertinent family history.  History  Substance Use Topics  . Smoking status: Never Smoker   . Smokeless tobacco: Not on file  . Alcohol Use: No    OB History    Grav Para Term Preterm Abortions TAB SAB Ect Mult Living                  Review of Systems  All other systems reviewed and are negative.    10 Systems  reviewed and all are negative for acute change except as noted in the HPI.    Allergies  Erythromycin; Morphine and related; and Sulfa drugs cross reactors  Home Medications   Current Outpatient Rx  Name Route Sig Dispense Refill  . ALPRAZOLAM 2 MG PO TABS Oral Take 4 mg by mouth at bedtime as needed.      . ARIPIPRAZOLE 2 MG PO TABS Oral Take 2 mg by mouth daily.      . DULOXETINE HCL 60 MG PO CPEP Oral Take 120 mg by mouth daily.      Marland Kitchen GABAPENTIN 600 MG PO TABS Oral Take 600 mg by mouth 2 (two) times daily.      Marland Kitchen LEVOTHYROXINE SODIUM 25 MCG PO TABS Oral Take 25 mcg by mouth daily.      Marland Kitchen METHOCARBAMOL 500 MG PO TABS Oral Take 500 mg by mouth 4 (four) times daily.      Marland Kitchen SPIRONOLACTONE 25 MG PO TABS Oral Take 25 mg by mouth daily.      . TOPIRAMATE 200 MG PO TABS Oral Take 200 mg by mouth 2 (two) times daily.        BP 127/84  Pulse 86  Temp 98.5 F (36.9 C) (Oral)  Resp 14  Ht 5\' 4"  (1.626 m)  Wt 168 lb (76.204 kg)  BMI 28.84 kg/m2  SpO2 98%  Physical Exam  Nursing note and vitals reviewed. Constitutional: She is oriented to person, place, and time. She appears well-developed and well-nourished.  HENT:  Head: Atraumatic.  Nose: Nose normal.  Mouth/Throat: Oropharynx is clear and moist.  Eyes: EOM are normal. Pupils are equal, round, and reactive to light.  Cardiovascular: Normal rate, regular rhythm and normal heart sounds.   Pulmonary/Chest: Effort normal and breath sounds normal.  Abdominal: Soft. Bowel sounds are normal. There is no tenderness.  Genitourinary:       Stool is mucoid and streaked with blood.   Musculoskeletal: Normal range of motion.  Neurological: She is alert and oriented to person, place, and time.  Skin: Skin is warm and dry.  Psychiatric: She has a normal mood and affect. Her behavior is normal.    ED Course  Procedures (including critical care time)  DIAGNOSTIC STUDIES: Oxygen Saturation is 98% on room air, normal by my interpretation.     COORDINATION OF CARE:    9:36PM- EDP at bedside discusses treatment plan concerning rectal exam, blood work, urinalysis, ct scan.   9:38PM- EDP at bedside to conduct rectal exam.   12:34 AM Results for orders placed during the hospital encounter of 09/18/11  COMPREHENSIVE METABOLIC PANEL      Component Value Range   Sodium 139  135 - 145 mEq/L   Potassium 3.7  3.5 - 5.1 mEq/L   Chloride 105  96 - 112 mEq/L   CO2 22  19 - 32 mEq/L   Glucose, Bld 109 (*) 70 - 99 mg/dL   BUN 11  6 - 23 mg/dL   Creatinine, Ser 8.11  0.50 - 1.10 mg/dL   Calcium 91.4 (*) 8.4 - 10.5 mg/dL   Total Protein 7.2  6.0 - 8.3 g/dL   Albumin 4.0  3.5 - 5.2 g/dL   AST 28  0 - 37 U/L   ALT 35  0 - 35 U/L   Alkaline Phosphatase 82  39 - 117 U/L   Total Bilirubin 0.2 (*) 0.3 - 1.2 mg/dL   GFR calc non Af Amer 79 (*) >90 mL/min   GFR calc Af Amer >90  >90 mL/min  LIPASE, BLOOD      Component Value Range   Lipase 46  11 - 59 U/L  URINALYSIS, ROUTINE W REFLEX MICROSCOPIC      Component Value Range   Color, Urine YELLOW  YELLOW   APPearance CLEAR  CLEAR   Specific Gravity, Urine 1.025  1.005 - 1.030   pH 6.0  5.0 - 8.0   Glucose, UA NEGATIVE  NEGATIVE mg/dL   Hgb urine dipstick SMALL (*) NEGATIVE   Bilirubin Urine NEGATIVE  NEGATIVE   Ketones, ur NEGATIVE  NEGATIVE mg/dL   Protein, ur NEGATIVE  NEGATIVE mg/dL   Urobilinogen, UA 0.2  0.0 - 1.0 mg/dL   Nitrite NEGATIVE  NEGATIVE   Leukocytes, UA NEGATIVE  NEGATIVE  URINE MICROSCOPIC-ADD ON      Component Value Range   WBC, UA 3-6  <3 WBC/hpf   RBC / HPF 3-6  <3 RBC/hpf   Bacteria, UA RARE  RARE   Ct Abdomen Pelvis W Contrast  09/19/2011  *RADIOLOGY REPORT*  Clinical Data: Bloody stool  CT ABDOMEN AND PELVIS WITH CONTRAST  Technique:  Multidetector CT imaging of the abdomen and pelvis was performed following the standard protocol during bolus administration of intravenous contrast.  Contrast: OMNIPAQUE IOHEXOL 300 MG/ML  SOLN  Comparison:  08/01/2009  Findings: Pelvic cysts in the kidneys.  Tiny calculi in both kidneys.  Post cholecystectomy.  Liver, spleen, pancreas, adrenal glands are within normal limits.  Stool throughout the colon.  No focal wall thickening to suggest diverticulitis.  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  Appendix not visualized.  Bladder is unremarkable.  Uterus is absent.  Adnexa are within normal limits.  No ureteral calculus.  No destructive bone lesion  No free fluid.  IMPRESSION: Bilateral nephrolithiasis.  No focal bowel pathology.  Stool throughout the colon is noted.  Original Report Authenticated By: Donavan Burnet, M.D.   Dg Abd Acute W/chest  09/19/2011  *RADIOLOGY REPORT*  Clinical Data: Diarrhea.  Rectal bleeding.  ACUTE ABDOMEN SERIES (ABDOMEN 2 VIEW & CHEST 1 VIEW)  Comparison: None.  Findings: Normal heart size.  Clear lungs.  No free intraperitoneal gas.  No disproportionate dilatation of bowel.  Oral and iodinated contrast project over the abdomen.  IMPRESSION: Nonobstructive bowel gas pattern.  Original Report Authenticated By: Donavan Burnet, M.D.    Lab results showed no serious intraabdominal cause for her rectal bleeding.  Will treat for infectious colitis with Cipro/Flagyl.    1. Infectious colitis     I personally performed the services described in this documentation, which was scribed in my presence. The recorded information has been reviewed and considered.  Osvaldo Human, M.D.     Carleene Cooper III, MD 09/19/11 [phone]

## 2011-09-19 LAB — CBC WITH DIFFERENTIAL/PLATELET
Eosinophils Absolute: 0.4 10*3/uL (ref 0.0–0.7)
Eosinophils Relative: 3 % (ref 0–5)
Hemoglobin: 12.8 g/dL (ref 12.0–15.0)
Lymphocytes Relative: 36 % (ref 12–46)
Lymphs Abs: 3.6 10*3/uL (ref 0.7–4.0)
MCH: 30.3 pg (ref 26.0–34.0)
MCV: 88.2 fL (ref 78.0–100.0)
Monocytes Relative: 8 % (ref 3–12)
Platelets: 310 10*3/uL (ref 150–400)
RBC: 4.22 MIL/uL (ref 3.87–5.11)
WBC: 10.2 10*3/uL (ref 4.0–10.5)

## 2011-09-19 LAB — OCCULT BLOOD, POC DEVICE: Fecal Occult Bld: POSITIVE

## 2011-09-19 MED ORDER — METRONIDAZOLE 500 MG PO TABS
500.0000 mg | ORAL_TABLET | Freq: Once | ORAL | Status: AC
  Administered 2011-09-19: 500 mg via ORAL
  Filled 2011-09-19: qty 1

## 2011-09-19 MED ORDER — METRONIDAZOLE 500 MG PO TABS: 500.0000 mg | ORAL_TABLET | Freq: Two times a day (BID) | ORAL | Status: DC

## 2011-09-19 MED ORDER — CIPROFLOXACIN HCL 500 MG PO TABS: 500.0000 mg | ORAL_TABLET | Freq: Two times a day (BID) | ORAL | Status: DC

## 2011-09-19 MED ORDER — CIPROFLOXACIN HCL 250 MG PO TABS
500.0000 mg | ORAL_TABLET | Freq: Once | ORAL | Status: AC
  Administered 2011-09-19: 500 mg via ORAL
  Filled 2011-09-19: qty 2

## 2011-09-19 NOTE — ED Notes (Signed)
Patient transported to CT 

## 2011-09-19 NOTE — ED Notes (Signed)
Patient returned from CT

## 2011-09-21 ENCOUNTER — Encounter (HOSPITAL_COMMUNITY): Payer: Self-pay | Admitting: Emergency Medicine

## 2011-09-21 ENCOUNTER — Emergency Department (HOSPITAL_COMMUNITY)
Admission: EM | Admit: 2011-09-21 | Discharge: 2011-09-21 | Disposition: A | Discharge status: Home / Self Care | Payer: Self-pay | Attending: Emergency Medicine | Admitting: Emergency Medicine

## 2011-09-21 DIAGNOSIS — G4730 Sleep apnea, unspecified: Secondary | ICD-10-CM | POA: Insufficient documentation

## 2011-09-21 DIAGNOSIS — Z79899 Other long term (current) drug therapy: Secondary | ICD-10-CM | POA: Insufficient documentation

## 2011-09-21 DIAGNOSIS — Z8739 Personal history of other diseases of the musculoskeletal system and connective tissue: Secondary | ICD-10-CM | POA: Insufficient documentation

## 2011-09-21 DIAGNOSIS — Z7982 Long term (current) use of aspirin: Secondary | ICD-10-CM | POA: Insufficient documentation

## 2011-09-21 DIAGNOSIS — IMO0001 Reserved for inherently not codable concepts without codable children: Secondary | ICD-10-CM | POA: Insufficient documentation

## 2011-09-21 DIAGNOSIS — A043 Enterohemorrhagic Escherichia coli infection: Secondary | ICD-10-CM | POA: Insufficient documentation

## 2011-09-21 DIAGNOSIS — E119 Type 2 diabetes mellitus without complications: Secondary | ICD-10-CM | POA: Insufficient documentation

## 2011-09-21 DIAGNOSIS — Z9089 Acquired absence of other organs: Secondary | ICD-10-CM | POA: Insufficient documentation

## 2011-09-21 LAB — COMPREHENSIVE METABOLIC PANEL
BUN: 8 mg/dL (ref 6–23)
CO2: 23 mEq/L (ref 19–32)
Calcium: 10.1 mg/dL (ref 8.4–10.5)
Chloride: 105 mEq/L (ref 96–112)
Creatinine, Ser: 0.93 mg/dL (ref 0.50–1.10)
GFR calc Af Amer: 79 mL/min — ABNORMAL LOW (ref >90)
GFR calc non Af Amer: 68 mL/min — ABNORMAL LOW (ref >90)
Glucose, Bld: 128 mg/dL — ABNORMAL HIGH (ref 70–99)
Total Bilirubin: 0.2 mg/dL — ABNORMAL LOW (ref 0.3–1.2)

## 2011-09-21 LAB — CBC WITH DIFFERENTIAL/PLATELET
Eosinophils Relative: 3 % (ref 0–5)
HCT: 40.3 % (ref 36.0–46.0)
Hemoglobin: 13.3 g/dL (ref 12.0–15.0)
Lymphocytes Relative: 31 % (ref 12–46)
MCV: 89.6 fL (ref 78.0–100.0)
Monocytes Absolute: 0.6 10*3/uL (ref 0.1–1.0)
Monocytes Relative: 6 % (ref 3–12)
Neutro Abs: 6.2 10*3/uL (ref 1.7–7.7)
WBC: 10.4 10*3/uL (ref 4.0–10.5)

## 2011-09-21 LAB — URINALYSIS, ROUTINE W REFLEX MICROSCOPIC
Leukocytes, UA: NEGATIVE
Nitrite: NEGATIVE
Protein, ur: NEGATIVE mg/dL
Urobilinogen, UA: 0.2 mg/dL (ref 0.0–1.0)

## 2011-09-21 LAB — URINE MICROSCOPIC-ADD ON

## 2011-09-21 MED ORDER — SODIUM CHLORIDE 0.9 % IV BOLUS (SEPSIS)
1000.0000 mL | Freq: Once | INTRAVENOUS | Status: AC
  Administered 2011-09-21: 1000 mL via INTRAVENOUS

## 2011-09-21 MED ORDER — ONDANSETRON HCL 4 MG/2ML IJ SOLN
4.0000 mg | Freq: Once | INTRAMUSCULAR | Status: AC
  Administered 2011-09-21: 4 mg via INTRAVENOUS
  Filled 2011-09-21: qty 2

## 2011-09-21 NOTE — ED Notes (Addendum)
Patient states she was seen here on Saturday for bloody diarrhea and vomiting and was given antibiotics. States the bleeding stopped but still having diarrhea and vomiting. States the bloody diarrhea has returned today. Also complaining of abdominal pain. Spouse states "She is also in withdrawal from cymbalta, she hasn't had it in 8 days."

## 2011-09-21 NOTE — ED Provider Notes (Signed)
History   This chart was scribed for Christina B. Bernette Mayers, MD by Sofie Rower. The patient was seen in room [facility] and the patient's care was started at 9:30 PM     CSN: [id number]  Arrival date & time 09/21/11  2055   First MD Initiated Contact with Patient 09/21/11 2117      Chief Complaint  Patient presents with  . Rectal Bleeding  . Emesis  . Abdominal Pain    (Consider location/radiation/quality/duration/timing/severity/associated sxs/prior treatment) Patient is a 56 y.o. female presenting with hematochezia, vomiting, and abdominal pain. The history is provided by the patient. No language interpreter was used.  Rectal Bleeding  The current episode started 3 to 5 days ago. The problem occurs frequently. The problem has been unchanged. The pain is moderate. The stool is described as bloody. There was no prior successful therapy. Associated symptoms include abdominal pain and vomiting. Pertinent negatives include no hematuria, no chest pain, no headaches and no coughing. The vomiting occurs rarely. The emesis has an appearance of stomach contents. The vomiting is associated with pain. The diarrhea is bloody.  Emesis  Associated symptoms include abdominal pain. Pertinent negatives include no cough and no headaches.  Abdominal Pain The primary symptoms of the illness include abdominal pain, vomiting and hematochezia. The current episode started more than 2 days ago. The onset of the illness was gradual. The problem has been gradually worsening.  Symptoms associated with the illness do not include hematuria.    Christina Lewis is a 56 y.o. female who presents to the Emergency Department complaining of eight days of, gradual, progressively worsening, moderate, constant abdominal pain with associated symptoms of emesis, chills, low grade fever (99.0), and light headedness. The pt reports her diarrhea is bright red in color and her urine is light colored. The pt has a hx of visiting [facility] for  similar symptoms on Saturday, 09/18/11. The pt was prescribed antibiotics. The pt denies dysuria, abnormal urine coloration, LOC, sore throat, cough, SOB, vomiting, nausea, rhinorrhea, visual disturbance, dysuria, and headache. The pt does not smoke or drink alcohol.   PCP is Dr. Sherryll Burger.    Past Medical History  Diagnosis Date  . Fibromyalgia   . Diabetes mellitus   . Arthritis   . Sleep apnea   . Gout   . Osteoarthritis   . Migraines     Past Surgical History  Procedure Date  . Tonsillectomy   . Appendectomy   . Cholecystectomy   . Breast lumpectomy   . Shoulder surgery     right shoulder  . Abdominal hysterectomy   . Oophorectomy   . Nasal sinus surgery     History reviewed. No pertinent family history.  History  Substance Use Topics  . Smoking status: Never Smoker   . Smokeless tobacco: Not on file  . Alcohol Use: No    OB History    Grav Para Term Preterm Abortions TAB SAB Ect Mult Living                  Review of Systems  Respiratory: Negative for cough.   Cardiovascular: Negative for chest pain.  Gastrointestinal: Positive for vomiting, abdominal pain and hematochezia.  Genitourinary: Negative for hematuria.  Neurological: Negative for headaches.  All other systems reviewed and are negative.    10 Systems reviewed and all are negative for acute change except as noted in the HPI.    Allergies  Sulfa drugs cross reactors; Erythromycin; and Morphine and related  Home  Medications   Current Outpatient Rx  Name Route Sig Dispense Refill  . ASPIRIN EC 81 MG PO TBEC Oral Take 81 mg by mouth daily.    Marland Kitchen VITAMIN D 2000 UNITS PO TABS Oral Take 2,000 Units by mouth daily.    Marland Kitchen CIPROFLOXACIN HCL 500 MG PO TABS Oral Take 1 tablet (500 mg total) by mouth 2 (two) times daily. 14 tablet 0  . DULOXETINE HCL 60 MG PO CPEP Oral Take 120 mg by mouth daily.      Marland Kitchen LEVOTHYROXINE SODIUM 25 MCG PO TABS Oral Take 25 mcg by mouth daily.      Marland Kitchen METFORMIN HCL 1000 MG PO  TABS Oral Take 1,000 mg by mouth at bedtime.    . METHOCARBAMOL 500 MG PO TABS Oral Take 500 mg by mouth 2 (two) times daily.     Marland Kitchen METRONIDAZOLE 500 MG PO TABS Oral Take 1 tablet (500 mg total) by mouth 2 (two) times daily. 14 tablet 0  . FISH OIL 1000 MG PO CAPS Oral Take 1,000 mg by mouth daily.    Marland Kitchen RISPERIDONE 0.25 MG PO TABS Oral Take 0.25 mg by mouth daily.    Marland Kitchen SPIRONOLACTONE 50 MG PO TABS Oral Take 50 mg by mouth daily.    Marland Kitchen TIZANIDINE HCL 4 MG PO TABS Oral Take 8 mg by mouth at bedtime.    . TOPIRAMATE 200 MG PO TABS Oral Take 200 mg by mouth at bedtime.       BP 140/105  Pulse 93  Temp 98.3 F (36.8 C) (Oral)  Resp 20  Ht 5\' 4"  (1.626 m)  Wt 168 lb (76.204 kg)  BMI 28.84 kg/m2  SpO2 100%  Physical Exam  Nursing note and vitals reviewed. Constitutional: She is oriented to person, place, and time. She appears well-developed and well-nourished.  HENT:  Head: Normocephalic and atraumatic.  Eyes: EOM are normal. Pupils are equal, round, and reactive to light.  Neck: Normal range of motion. Neck supple.  Cardiovascular: Normal rate, normal heart sounds and intact distal pulses.   Pulmonary/Chest: Effort normal and breath sounds normal.  Abdominal: She exhibits no distension. There is no tenderness.       Increased bowel sounds detected.   Musculoskeletal: Normal range of motion. She exhibits no edema and no tenderness.  Neurological: She is alert and oriented to person, place, and time. She has normal strength. No cranial nerve deficit or sensory deficit.  Skin: Skin is warm and dry. No rash noted.  Psychiatric: She has a normal mood and affect.    ED Course  Procedures (including critical care time)   Medications  sodium chloride 0.9 % bolus 1,000 mL (not administered)  ondansetron (ZOFRAN) injection 4 mg (not administered)     DIAGNOSTIC STUDIES: Oxygen Saturation is 100% on room air, normal by my interpretation.    COORDINATION OF CARE:  9:36PM- treatment  plan discussed. Patient agrees with treatment. Stool sample ordered.     Labs Reviewed  COMPREHENSIVE METABOLIC PANEL - Abnormal; Notable for the following:    Glucose, Bld 128 (*)     ALT 39 (*)     Total Bilirubin 0.2 (*)     GFR calc non Af Amer 68 (*)     GFR calc Af Amer 79 (*)     All other components within normal limits  CBC WITH DIFFERENTIAL  URINALYSIS, ROUTINE W REFLEX MICROSCOPIC  STOOL CULTURE  CLOSTRIDIUM DIFFICILE BY PCR   No results found.  No diagnosis found.    MDM  Pt with bloody diarrhea, seen for same several days ago had neg CT, given Cipro/Flagyl with minimal improvement. Still having some occasional abdominal pain with bloody stools. Labs unremarkable today. Likely has EHEC in which case Abx are not helpful. Advised to stop. Advised GI followup for recheck. Stool sent for culture and Cdiff PCR.       I personally performed the services described in the documentation, which were scribed in my presence. The recorded information has been reviewed and considered.     Christina B. Bernette Mayers, MD 09/21/11 2317

## 2011-09-22 LAB — CLOSTRIDIUM DIFFICILE BY PCR: Toxigenic C. Difficile by PCR: NEGATIVE

## 2011-09-26 LAB — STOOL CULTURE

## 2011-11-11 ENCOUNTER — Other Ambulatory Visit: Payer: Self-pay | Admitting: Internal Medicine

## 2011-11-11 DIAGNOSIS — Z1231 Encounter for screening mammogram for malignant neoplasm of breast: Secondary | ICD-10-CM

## 2011-12-07 ENCOUNTER — Ambulatory Visit
Admission: RE | Admit: 2011-12-07 | Discharge: 2011-12-07 | Disposition: A | Discharge status: Home / Self Care | Payer: BC Managed Care – PPO | Source: Ambulatory Visit | Attending: Internal Medicine | Admitting: Internal Medicine

## 2011-12-07 DIAGNOSIS — Z1231 Encounter for screening mammogram for malignant neoplasm of breast: Secondary | ICD-10-CM

## 2012-02-14 ENCOUNTER — Other Ambulatory Visit (HOSPITAL_COMMUNITY): Payer: Self-pay | Admitting: Endocrinology

## 2012-02-14 DIAGNOSIS — E049 Nontoxic goiter, unspecified: Secondary | ICD-10-CM

## 2012-02-21 ENCOUNTER — Ambulatory Visit (HOSPITAL_COMMUNITY)
Admission: RE | Admit: 2012-02-21 | Discharge: 2012-02-21 | Disposition: A | Discharge status: Home / Self Care | Payer: BC Managed Care – PPO | Source: Ambulatory Visit | Attending: Endocrinology | Admitting: Endocrinology

## 2012-02-21 DIAGNOSIS — E049 Nontoxic goiter, unspecified: Secondary | ICD-10-CM

## 2012-02-21 DIAGNOSIS — E041 Nontoxic single thyroid nodule: Secondary | ICD-10-CM | POA: Insufficient documentation

## 2012-02-23 HISTORY — PX: KIDNEY STONE SURGERY: SHX686

## 2012-03-14 ENCOUNTER — Other Ambulatory Visit (HOSPITAL_COMMUNITY): Payer: Self-pay | Admitting: Endocrinology

## 2012-03-14 DIAGNOSIS — E049 Nontoxic goiter, unspecified: Secondary | ICD-10-CM

## 2012-03-14 DIAGNOSIS — R221 Localized swelling, mass and lump, neck: Secondary | ICD-10-CM

## 2012-03-16 ENCOUNTER — Ambulatory Visit (HOSPITAL_COMMUNITY): Payer: BC Managed Care – PPO

## 2012-03-16 ENCOUNTER — Other Ambulatory Visit (HOSPITAL_COMMUNITY): Payer: BC Managed Care – PPO

## 2012-03-21 ENCOUNTER — Other Ambulatory Visit (HOSPITAL_COMMUNITY): Payer: Self-pay | Admitting: Endocrinology

## 2012-03-21 ENCOUNTER — Ambulatory Visit (HOSPITAL_COMMUNITY)
Admission: RE | Admit: 2012-03-21 | Discharge: 2012-03-21 | Disposition: A | Discharge status: Home / Self Care | Payer: BC Managed Care – PPO | Source: Ambulatory Visit | Attending: Endocrinology | Admitting: Endocrinology

## 2012-03-21 DIAGNOSIS — R221 Localized swelling, mass and lump, neck: Secondary | ICD-10-CM

## 2012-03-21 DIAGNOSIS — E049 Nontoxic goiter, unspecified: Secondary | ICD-10-CM | POA: Insufficient documentation

## 2012-03-21 NOTE — Progress Notes (Signed)
Lidocaine 1%         1mL injected                 Left thyroid biopsies performed

## 2012-05-17 ENCOUNTER — Other Ambulatory Visit (HOSPITAL_COMMUNITY): Payer: Self-pay | Admitting: Endocrinology

## 2012-05-17 DIAGNOSIS — E049 Nontoxic goiter, unspecified: Secondary | ICD-10-CM

## 2012-11-09 ENCOUNTER — Other Ambulatory Visit (HOSPITAL_COMMUNITY): Payer: Self-pay | Admitting: Internal Medicine

## 2012-11-09 DIAGNOSIS — Z139 Encounter for screening, unspecified: Secondary | ICD-10-CM

## 2012-11-16 ENCOUNTER — Ambulatory Visit (HOSPITAL_COMMUNITY)
Admission: RE | Admit: 2012-11-16 | Discharge: 2012-11-16 | Disposition: A | Discharge status: Home / Self Care | Payer: BC Managed Care – PPO | Source: Ambulatory Visit | Attending: Endocrinology | Admitting: Endocrinology

## 2012-11-16 DIAGNOSIS — E041 Nontoxic single thyroid nodule: Secondary | ICD-10-CM | POA: Insufficient documentation

## 2012-11-16 DIAGNOSIS — E049 Nontoxic goiter, unspecified: Secondary | ICD-10-CM

## 2012-11-21 ENCOUNTER — Other Ambulatory Visit (HOSPITAL_COMMUNITY): Payer: BC Managed Care – PPO

## 2012-12-11 ENCOUNTER — Ambulatory Visit (HOSPITAL_COMMUNITY): Payer: BC Managed Care – PPO

## 2012-12-14 ENCOUNTER — Ambulatory Visit (HOSPITAL_COMMUNITY)
Admission: RE | Admit: 2012-12-14 | Discharge: 2012-12-14 | Disposition: A | Discharge status: Home / Self Care | Payer: BC Managed Care – PPO | Source: Ambulatory Visit | Attending: Internal Medicine | Admitting: Internal Medicine

## 2012-12-14 DIAGNOSIS — Z139 Encounter for screening, unspecified: Secondary | ICD-10-CM

## 2012-12-14 DIAGNOSIS — Z1231 Encounter for screening mammogram for malignant neoplasm of breast: Secondary | ICD-10-CM | POA: Insufficient documentation

## 2013-08-01 ENCOUNTER — Encounter (HOSPITAL_BASED_OUTPATIENT_CLINIC_OR_DEPARTMENT_OTHER): Payer: Self-pay | Admitting: *Deleted

## 2013-08-01 NOTE — Progress Notes (Signed)
To go to [facility] for [id number] bring cpap dos and use post op

## 2013-08-02 ENCOUNTER — Encounter (HOSPITAL_COMMUNITY)
Admission: RE | Admit: 2013-08-02 | Discharge: 2013-08-02 | Disposition: A | Discharge status: Home / Self Care | Payer: BC Managed Care – PPO | Source: Ambulatory Visit | Attending: Orthopedic Surgery | Admitting: Orthopedic Surgery

## 2013-08-02 DIAGNOSIS — Z01810 Encounter for preprocedural cardiovascular examination: Secondary | ICD-10-CM | POA: Insufficient documentation

## 2013-08-02 DIAGNOSIS — Z01812 Encounter for preprocedural laboratory examination: Secondary | ICD-10-CM | POA: Insufficient documentation

## 2013-08-02 LAB — BASIC METABOLIC PANEL
BUN: 22 mg/dL (ref 6–23)
CALCIUM: 9.9 mg/dL (ref 8.4–10.5)
CHLORIDE: 101 meq/L (ref 96–112)
CO2: 22 meq/L (ref 19–32)
Creatinine, Ser: 1.01 mg/dL (ref 0.50–1.10)
GFR calc Af Amer: 71 mL/min — ABNORMAL LOW (ref >90)
GFR calc non Af Amer: 61 mL/min — ABNORMAL LOW (ref >90)
GLUCOSE: 95 mg/dL (ref 70–99)
Potassium: 4.5 mEq/L (ref 3.7–5.3)
SODIUM: 139 meq/L (ref 137–147)

## 2013-08-05 ENCOUNTER — Other Ambulatory Visit: Payer: Self-pay | Admitting: Physician Assistant

## 2013-08-05 NOTE — H&P (Signed)
Christina Lewis is an 58 y.o. female.   Chief Complaint: right knee pain HPI: patient of dr voytek and devashaur referred to dr caffrey with right knee medial meniscus tear.  Failed conservative treatments discussed outpatient arthroscopy.  ROS: 10 point review of systems reviewed positive for headache, right knee pain, myalgias, otherwise no significant findings.  Past Medical History  Diagnosis Date  . Fibromyalgia   . Diabetes mellitus   . Arthritis   . Sleep apnea   . Gout   . Osteoarthritis   . Migraines   . PONV (postoperative nausea and vomiting)     Past Surgical History  Procedure Laterality Date  . Tonsillectomy    . Appendectomy    . Shoulder surgery  2011    right shoulder  . Oophorectomy    . Nasal sinus surgery      x4  . Breast lumpectomy  2008    lt-negative  . Cholecystectomy  1985  . Abdominal hysterectomy  2000  . Colonoscopy      No family history on file. Social History:  reports that she has never smoked. She does not have any smokeless tobacco history on file. She reports that she does not drink alcohol or use illicit drugs.  Allergies:  Allergies  Allergen Reactions  . Sulfa Drugs Cross Reactors Anaphylaxis  . Codeine Itching  . Erythromycin Nausea And Vomiting  . Morphine And Related Itching     (Not in a hospital admission)  No results found for this or any previous visit (from the past 48 hour(s)). No results found.  ROS  There were no vitals taken for this visit. Physical Exam  Constitutional: She is oriented to person, place, and time. She appears well-developed and well-nourished. No distress.  HENT:  Head: Normocephalic and atraumatic.  Nose: Nose normal.  Eyes: Conjunctivae are normal. Pupils are equal, round, and reactive to light.  Neck: Normal range of motion. Neck supple.  Cardiovascular: Normal rate, regular rhythm and intact distal pulses.   Respiratory: Effort normal. No respiratory distress.  GI: Soft. She  exhibits no distension. There is no tenderness.  Lymphadenopathy:    She has no cervical adenopathy.  Neurological: She is alert and oriented to person, place, and time.  Skin: Skin is warm and dry.  Psychiatric: She has a normal mood and affect. Her behavior is normal.     Assessment/Plan Right knee medial meniscus tear  Discussed risks and benefits of outpatient right knee arthroscopy debridement medial menisectomy and patient wishes to proceed.  This will [facility] to be given post op for pain.    [name] 08/05/2013, 9:23 PM    

## 2013-08-06 ENCOUNTER — Encounter (HOSPITAL_BASED_OUTPATIENT_CLINIC_OR_DEPARTMENT_OTHER): Payer: Self-pay

## 2013-08-06 ENCOUNTER — Encounter (HOSPITAL_BASED_OUTPATIENT_CLINIC_OR_DEPARTMENT_OTHER): Payer: BC Managed Care – PPO | Admitting: Anesthesiology

## 2013-08-06 ENCOUNTER — Ambulatory Visit (HOSPITAL_BASED_OUTPATIENT_CLINIC_OR_DEPARTMENT_OTHER)
Admission: RE | Admit: 2013-08-06 | Discharge: 2013-08-06 | Disposition: A | Discharge status: Home / Self Care | Payer: BC Managed Care – PPO | Source: Ambulatory Visit | Attending: Orthopedic Surgery | Admitting: Orthopedic Surgery

## 2013-08-06 ENCOUNTER — Ambulatory Visit (HOSPITAL_BASED_OUTPATIENT_CLINIC_OR_DEPARTMENT_OTHER): Payer: BC Managed Care – PPO | Admitting: Anesthesiology

## 2013-08-06 ENCOUNTER — Encounter (HOSPITAL_BASED_OUTPATIENT_CLINIC_OR_DEPARTMENT_OTHER)
Admission: RE | Disposition: A | Discharge status: Home / Self Care | Payer: Self-pay | Source: Ambulatory Visit | Attending: Orthopedic Surgery

## 2013-08-06 DIAGNOSIS — M23329 Other meniscus derangements, posterior horn of medial meniscus, unspecified knee: Secondary | ICD-10-CM | POA: Insufficient documentation

## 2013-08-06 DIAGNOSIS — E119 Type 2 diabetes mellitus without complications: Secondary | ICD-10-CM | POA: Insufficient documentation

## 2013-08-06 DIAGNOSIS — G4730 Sleep apnea, unspecified: Secondary | ICD-10-CM | POA: Insufficient documentation

## 2013-08-06 DIAGNOSIS — M1990 Unspecified osteoarthritis, unspecified site: Secondary | ICD-10-CM | POA: Insufficient documentation

## 2013-08-06 HISTORY — DX: Nausea with vomiting, unspecified: R11.2

## 2013-08-06 HISTORY — DX: Other specified postprocedural states: Z98.890

## 2013-08-06 HISTORY — PX: KNEE ARTHROSCOPY WITH PATELLA RECONSTRUCTION: SHX5654

## 2013-08-06 LAB — POCT HEMOGLOBIN-HEMACUE: Hemoglobin: 12.8 g/dL (ref 12.0–15.0)

## 2013-08-06 LAB — GLUCOSE, CAPILLARY
Glucose-Capillary: 103 mg/dL — ABNORMAL HIGH (ref 70–99)
Glucose-Capillary: 113 mg/dL — ABNORMAL HIGH (ref 70–99)

## 2013-08-06 SURGERY — ARTHROSCOPY, KNEE, WITH PATELLAR RECONSTRUCTION
Anesthesia: General | Site: Knee | Laterality: Right

## 2013-08-06 MED ORDER — EPINEPHRINE HCL 1 MG/ML IJ SOLN
INTRAMUSCULAR | Status: AC
  Filled 2013-08-06: qty 1

## 2013-08-06 MED ORDER — LACTATED RINGERS IV SOLN
INTRAVENOUS | Status: DC
  Administered 2013-08-06: 10 mL/h via INTRAVENOUS
  Administered 2013-08-06: 07:00:00 via INTRAVENOUS

## 2013-08-06 MED ORDER — CEFAZOLIN SODIUM-DEXTROSE 2-3 GM-% IV SOLR
2.0000 g | INTRAVENOUS | Status: AC
  Administered 2013-08-06: 2 g via INTRAVENOUS

## 2013-08-06 MED ORDER — FENTANYL CITRATE 0.05 MG/ML IJ SOLN
INTRAMUSCULAR | Status: AC
  Filled 2013-08-06: qty 2

## 2013-08-06 MED ORDER — PROPOFOL 10 MG/ML IV BOLUS
INTRAVENOUS | Status: DC | PRN
  Administered 2013-08-06: 150 mg via INTRAVENOUS

## 2013-08-06 MED ORDER — METHYLPREDNISOLONE ACETATE 40 MG/ML IJ SUSP
INTRAMUSCULAR | Status: DC | PRN
  Administered 2013-08-06: 40 mg

## 2013-08-06 MED ORDER — BUPIVACAINE-EPINEPHRINE 0.5% -1:200000 IJ SOLN
INTRAMUSCULAR | Status: DC | PRN
  Administered 2013-08-06: 30 mL

## 2013-08-06 MED ORDER — LIDOCAINE HCL (CARDIAC) 20 MG/ML IV SOLN
INTRAVENOUS | Status: DC | PRN
  Administered 2013-08-06: 60 mg via INTRAVENOUS

## 2013-08-06 MED ORDER — METHYLPREDNISOLONE ACETATE 40 MG/ML IJ SUSP
INTRAMUSCULAR | Status: AC
  Filled 2013-08-06: qty 1

## 2013-08-06 MED ORDER — MIDAZOLAM HCL 2 MG/2ML IJ SOLN: 1.0000 mg | INTRAMUSCULAR | Status: DC | PRN

## 2013-08-06 MED ORDER — FENTANYL CITRATE 0.05 MG/ML IJ SOLN
INTRAMUSCULAR | Status: DC | PRN
  Administered 2013-08-06: 100 ug via INTRAVENOUS
  Administered 2013-08-06 (×2): 25 ug via INTRAVENOUS

## 2013-08-06 MED ORDER — MIDAZOLAM HCL 5 MG/5ML IJ SOLN
INTRAMUSCULAR | Status: DC | PRN
  Administered 2013-08-06: 2 mg via INTRAVENOUS

## 2013-08-06 MED ORDER — DIPHENHYDRAMINE HCL 50 MG/ML IJ SOLN
12.5000 mg | Freq: Once | INTRAMUSCULAR | Status: AC
  Administered 2013-08-06: 12.5 mg via INTRAVENOUS

## 2013-08-06 MED ORDER — FENTANYL CITRATE 0.05 MG/ML IJ SOLN
25.0000 ug | INTRAMUSCULAR | Status: DC | PRN
  Administered 2013-08-06: 50 ug via INTRAVENOUS

## 2013-08-06 MED ORDER — BUPIVACAINE-EPINEPHRINE (PF) 0.5% -1:200000 IJ SOLN
INTRAMUSCULAR | Status: AC
  Filled 2013-08-06: qty 30

## 2013-08-06 MED ORDER — CEFAZOLIN SODIUM-DEXTROSE 2-3 GM-% IV SOLR
INTRAVENOUS | Status: AC
  Filled 2013-08-06: qty 50

## 2013-08-06 MED ORDER — FENTANYL CITRATE 0.05 MG/ML IJ SOLN
INTRAMUSCULAR | Status: AC
  Filled 2013-08-06: qty 4

## 2013-08-06 MED ORDER — METHYLPREDNISOLONE ACETATE 80 MG/ML IJ SUSP
INTRAMUSCULAR | Status: AC
  Filled 2013-08-06: qty 1

## 2013-08-06 MED ORDER — SODIUM CHLORIDE 0.9 % IR SOLN
Status: DC | PRN
  Administered 2013-08-06: 6000 mL

## 2013-08-06 MED ORDER — MIDAZOLAM HCL 2 MG/2ML IJ SOLN
INTRAMUSCULAR | Status: AC
  Filled 2013-08-06: qty 2

## 2013-08-06 MED ORDER — DEXAMETHASONE SODIUM PHOSPHATE 4 MG/ML IJ SOLN
INTRAMUSCULAR | Status: DC | PRN
  Administered 2013-08-06: 10 mg via INTRAVENOUS

## 2013-08-06 MED ORDER — ONDANSETRON HCL 4 MG/2ML IJ SOLN
INTRAMUSCULAR | Status: DC | PRN
  Administered 2013-08-06: 4 mg via INTRAVENOUS

## 2013-08-06 MED ORDER — FENTANYL CITRATE 0.05 MG/ML IJ SOLN: 50.0000 ug | INTRAMUSCULAR | Status: DC | PRN

## 2013-08-06 MED ORDER — CHLORHEXIDINE GLUCONATE 4 % EX LIQD: 60.0000 mL | Freq: Once | CUTANEOUS | Status: DC

## 2013-08-06 MED ORDER — EPINEPHRINE HCL 1 MG/ML IJ SOLN
INTRAMUSCULAR | Status: DC | PRN
  Administered 2013-08-06: 2 mL

## 2013-08-06 MED ORDER — DIPHENHYDRAMINE HCL 50 MG/ML IJ SOLN
INTRAMUSCULAR | Status: AC
  Filled 2013-08-06: qty 1

## 2013-08-06 MED ORDER — SODIUM CHLORIDE 0.9 % IV SOLN: INTRAVENOUS | Status: DC

## 2013-08-06 SURGICAL SUPPLY — 71 items
APL SKNCLS STERI-STRIP NONHPOA (GAUZE/BANDAGES/DRESSINGS)
BANDAGE ELASTIC 6 VELCRO ST LF (GAUZE/BANDAGES/DRESSINGS) ×2 IMPLANT
BANDAGE ESMARK 6X9 LF (GAUZE/BANDAGES/DRESSINGS) IMPLANT
BENZOIN TINCTURE PRP APPL 2/3 (GAUZE/BANDAGES/DRESSINGS) IMPLANT
BLADE 4.2CUDA (BLADE) IMPLANT
BLADE CUDA 5.5 (BLADE) IMPLANT
BLADE CUDA GRT WHITE 3.5 (BLADE) ×2 IMPLANT
BLADE CUDA SHAVER 3.5 (BLADE) IMPLANT
BLADE CUTTER MENIS 5.5 (BLADE) IMPLANT
BLADE GREAT WHITE 4.2 (BLADE) IMPLANT
BLADE GREAT WHITE 4.2MM (BLADE)
BLADE SURG 15 STRL LF DISP TIS (BLADE) ×1 IMPLANT
BLADE SURG 15 STRL SS (BLADE) ×3
BNDG CMPR 9X6 STRL LF SNTH (GAUZE/BANDAGES/DRESSINGS)
BNDG ESMARK 6X9 LF (GAUZE/BANDAGES/DRESSINGS)
BNDG GAUZE ELAST 4 BULKY (GAUZE/BANDAGES/DRESSINGS) ×3 IMPLANT
BRUSH SCRUB EZ PLAIN DRY (MISCELLANEOUS) ×3 IMPLANT
CANISTER SUCT 3000ML (MISCELLANEOUS) IMPLANT
CLOSURE WOUND 1/2 X4 (GAUZE/BANDAGES/DRESSINGS)
CUFF TOURNIQUET SINGLE 34IN LL (TOURNIQUET CUFF) ×3 IMPLANT
CUTTER MENISCUS  4.2MM (BLADE) ×2
CUTTER MENISCUS 4.2MM (BLADE) IMPLANT
DRAPE ARTHROSCOPY W/POUCH 114 (DRAPES) ×3 IMPLANT
DRAPE U-SHAPE 47X51 STRL (DRAPES) IMPLANT
DRSG EMULSION OIL 3X3 NADH (GAUZE/BANDAGES/DRESSINGS) ×3 IMPLANT
DRSG PAD ABDOMINAL 8X10 ST (GAUZE/BANDAGES/DRESSINGS) ×2 IMPLANT
DURAPREP 26ML APPLICATOR (WOUND CARE) ×3 IMPLANT
ELECT REM PT RETURN 9FT ADLT (ELECTROSURGICAL) ×3
ELECTRODE REM PT RTRN 9FT ADLT (ELECTROSURGICAL) ×1 IMPLANT
GAUZE SPONGE 4X4 12PLY STRL (GAUZE/BANDAGES/DRESSINGS) ×3 IMPLANT
GLOVE BIOGEL M 7.0 STRL (GLOVE) ×2 IMPLANT
GLOVE BIOGEL PI IND STRL 8 (GLOVE) ×2 IMPLANT
GLOVE BIOGEL PI INDICATOR 8 (GLOVE) ×4
GLOVE SURG ORTHO 8.0 STRL STRW (GLOVE) ×3 IMPLANT
GOWN STRL REUS W/ TWL LRG LVL3 (GOWN DISPOSABLE) ×1 IMPLANT
GOWN STRL REUS W/ TWL XL LVL3 (GOWN DISPOSABLE) ×1 IMPLANT
GOWN STRL REUS W/TWL LRG LVL3 (GOWN DISPOSABLE) ×3
GOWN STRL REUS W/TWL XL LVL3 (GOWN DISPOSABLE) ×3
HOLDER KNEE FOAM BLUE (MISCELLANEOUS) ×3 IMPLANT
IMMOBILIZER KNEE 22 UNIV (SOFTGOODS) IMPLANT
IMMOBILIZER KNEE 24 THIGH 36 (MISCELLANEOUS) IMPLANT
IMMOBILIZER KNEE 24 UNIV (MISCELLANEOUS)
KNEE WRAP E Z 3 GEL PACK (MISCELLANEOUS) ×2 IMPLANT
MANIFOLD NEPTUNE II (INSTRUMENTS) ×2 IMPLANT
NDL SAFETY ECLIPSE 18X1.5 (NEEDLE) ×1 IMPLANT
NEEDLE HYPO 18GX1.5 SHARP (NEEDLE) ×3
PACK ARTHROSCOPY DSU (CUSTOM PROCEDURE TRAY) ×3 IMPLANT
PACK BASIN DAY SURGERY FS (CUSTOM PROCEDURE TRAY) ×3 IMPLANT
PENCIL BUTTON HOLSTER BLD 10FT (ELECTRODE) ×3 IMPLANT
SET ARTHROSCOPY TUBING (MISCELLANEOUS) ×3
SET ARTHROSCOPY TUBING LN (MISCELLANEOUS) ×1 IMPLANT
SPONGE GAUZE 4X4 12PLY STER LF (GAUZE/BANDAGES/DRESSINGS) ×2 IMPLANT
SPONGE LAP 4X18 X RAY DECT (DISPOSABLE) ×3 IMPLANT
STRIP CLOSURE SKIN 1/2X4 (GAUZE/BANDAGES/DRESSINGS) IMPLANT
SUCTION FRAZIER TIP 10 FR DISP (SUCTIONS) IMPLANT
SUT ETHILON 4 0 PS 2 18 (SUTURE) ×3 IMPLANT
SUT FIBERWIRE #2 38 T-5 BLUE (SUTURE)
SUT MNCRL AB 3-0 PS2 18 (SUTURE) IMPLANT
SUT VIC AB 0 CT1 27 (SUTURE)
SUT VIC AB 0 CT1 27XBRD ANBCTR (SUTURE) IMPLANT
SUT VIC AB 1 CT1 27 (SUTURE)
SUT VIC AB 1 CT1 27XBRD ANBCTR (SUTURE) IMPLANT
SUT VIC AB 2-0 SH 18 (SUTURE) IMPLANT
SUTURE FIBERWR #2 38 T-5 BLUE (SUTURE) IMPLANT
SYR 5ML LL (SYRINGE) ×3 IMPLANT
TOWEL OR 17X24 6PK STRL BLUE (TOWEL DISPOSABLE) ×3 IMPLANT
WAND 3.0 CAPSURE 30 DEG W/CORD (SURGICAL WAND) IMPLANT
WAND 30 DEG SABER W/CORD (SURGICAL WAND) IMPLANT
WAND STAR VAC 90 (SURGICAL WAND) IMPLANT
WATER STERILE IRR 1000ML POUR (IV SOLUTION) ×3 IMPLANT
YANKAUER SUCT BULB TIP NO VENT (SUCTIONS) IMPLANT

## 2013-08-06 NOTE — Anesthesia Procedure Notes (Signed)
Procedure Name: LMA Insertion Date/Time: 08/06/2013 7:41 AM Performed by: Gar Gibbon Pre-anesthesia Checklist: Patient identified, Emergency Drugs available, Suction available and Patient being monitored Patient Re-evaluated:Patient Re-evaluated prior to inductionOxygen Delivery Method: Circle System Utilized Preoxygenation: Pre-oxygenation with 100% oxygen Intubation Type: IV induction Ventilation: Mask ventilation without difficulty LMA: LMA inserted LMA Size: 4.0 Number of attempts: 1 Airway Equipment and Method: bite block Placement Confirmation: positive ETCO2 Tube secured with: Tape Dental Injury: Teeth and Oropharynx as per pre-operative assessment

## 2013-08-06 NOTE — Discharge Instructions (Signed)
Diet: As you were doing prior to hospitalization   Activity: Increase activity slowly as tolerated  No lifting or driving for 1-2 days  Shower: May shower without a dressing on post op day #2, NO SOAKING in tub   Dressing: You may change your dressing on post op day #2.  Then change the dressing daily with band aids.  Weight Bearing: weight bearing as tolerated.  To prevent constipation: you may use a stool softener such as -  Colace ( over the counter) 100 mg by mouth twice a day  Drink plenty of fluids ( prune juice may be helpful) and high fiber foods     Post Anesthesia Home Care Instructions  Activity: Get plenty of rest for the remainder of the day. A responsible adult should stay with you for 24 hours following the procedure.  For the next 24 hours, DO NOT: -Drive a car -Advertising copywriter -Drink alcoholic beverages -Take any medication unless instructed by your physician -Make any legal decisions or sign important papers.  Meals: Start with liquid foods such as gelatin or soup. Progress to regular foods as tolerated. Avoid greasy, spicy, heavy foods. If nausea and/or vomiting occur, drink only clear liquids until the nausea and/or vomiting subsides. Call your physician if vomiting continues.  Special Instructions/Symptoms: Your throat may feel dry or sore from the anesthesia or the breathing tube placed in your throat during surgery. If this causes discomfort, gargle with warm salt water. The discomfort should disappear within 24 hours.  Miralax ( over the counter) for constipation as needed.   Precautions: If you experience chest pain or shortness of breath - call 911 immediately For transfer to the hospital emergency department!!  If you develop a fever greater that 101 F, purulent drainage from wound, increased redness or drainage from wound, or calf pain -- Call the office   Follow- Up Appointment: Please call for an appointment to be seen in 1 weeks  [place] -  [phone]

## 2013-08-06 NOTE — Brief Op Note (Signed)
08/06/2013  8:40 AM  PATIENT:  Christina Lewis  58 y.o. female  PRE-OPERATIVE DIAGNOSIS:  RIGHT DISLOCAATION OF KNEE, TEAR OF MEDIAL CARTILAGE OR MENISCUS OF KNEE CURRENT, CHONDROMALACIA-PATELLA      POST-OPERATIVE DIAGNOSIS:  RIGHT DISLOCATION OF KNEE, TEAR OF MEDIA LMENISCUS OF KNEE CURRENT  PROCEDURE:  Procedure(s): RIGHT KNEE ARTHROSCOPY WITH MENISCECTOMY MEDIAL, ARTHROSCOPY KNEE WITH DEBRIDEMENT/SHAVING (CHONDROPLASTY)  (Right)  SURGEON:  Surgeon(s) and Role:    * W D Carloyn Manner., MD - Primary  PHYSICIAN ASSISTANT:   ASSISTANTS:   ANESTHESIA:   general  EBL:  Total I/O In: 750 [I.V.:750] Out: -   BLOOD ADMINISTERED:none  DRAINS: none   LOCAL MEDICATIONS USED:  NONE  SPECIMEN:  No Specimen  DISPOSITION OF SPECIMEN:  N/A  COUNTS:  YES  TOURNIQUET:    DICTATION: .Other Dictation: Dictation Number unknown  PLAN OF CARE: Discharge to home after PACU  PATIENT DISPOSITION:  PACU - hemodynamically stable.   Delay start of Pharmacological VTE agent (>24hrs) due to surgical blood loss or risk of bleeding: not applicable

## 2013-08-06 NOTE — Anesthesia Postprocedure Evaluation (Signed)
  Anesthesia Post-op Note  Patient: Christina Lewis  Procedure(s) Performed: Procedure(s): RIGHT KNEE ARTHROSCOPY WITH MENISCECTOMY MEDIAL, ARTHROSCOPY KNEE WITH DEBRIDEMENT/SHAVING (CHONDROPLASTY)  (Right)  Patient Location: PACU  Anesthesia Type:General  Level of Consciousness: awake  Airway and Oxygen Therapy: Patient Spontanous Breathing  Post-op Pain: mild  Post-op Assessment: Post-op Vital signs reviewed  Post-op Vital Signs: Reviewed  Last Vitals:  Filed Vitals:   08/06/13 0823  BP: 135/85  Pulse:   Temp:   Resp:     Complications: No apparent anesthesia complications

## 2013-08-06 NOTE — Anesthesia Preprocedure Evaluation (Addendum)
Anesthesia Evaluation  Patient identified by MRN, date of birth, ID band Patient awake    Reviewed: Allergy & Precautions, H&P , NPO status , Patient's Chart, lab work & pertinent test results  History of Anesthesia Complications (+) PONV  Airway Mallampati: II      Dental   Pulmonary sleep apnea ,  breath sounds clear to auscultation        Cardiovascular negative cardio ROS  Rhythm:Regular     Neuro/Psych  Headaches,  Neuromuscular disease    GI/Hepatic negative GI ROS, Neg liver ROS,   Endo/Other  diabetes  Renal/GU negative Renal ROS     Musculoskeletal   Abdominal   Peds  Hematology   Anesthesia Other Findings   Reproductive/Obstetrics                          Anesthesia Physical Anesthesia Plan  ASA: II  Anesthesia Plan: General   Post-op Pain Management:    Induction: Intravenous  Airway Management Planned: LMA  Additional Equipment:   Intra-op Plan:   Post-operative Plan: Extubation in OR  Informed Consent: I have reviewed the patients History and Physical, chart, labs and discussed the procedure including the risks, benefits and alternatives for the proposed anesthesia with the patient or authorized representative who has indicated his/her understanding and acceptance.   Dental advisory given  Plan Discussed with: CRNA and Anesthesiologist  Anesthesia Plan Comments:         Anesthesia Quick Evaluation

## 2013-08-06 NOTE — Transfer of Care (Signed)
Immediate Anesthesia Transfer of Care Note  Patient: Christina Lewis  Procedure(s) Performed: Procedure(s): RIGHT KNEE ARTHROSCOPY WITH MENISCECTOMY MEDIAL, ARTHROSCOPY KNEE WITH DEBRIDEMENT/SHAVING (CHONDROPLASTY)  (Right)  Patient Location: PACU  Anesthesia Type:General  Level of Consciousness: awake, sedated and patient cooperative  Airway & Oxygen Therapy: Patient Spontanous Breathing and Patient connected to face mask oxygen  Post-op Assessment: Report given to PACU RN and Post -op Vital signs reviewed and stable  Post vital signs: Reviewed and stable  Complications: No apparent anesthesia complications

## 2013-08-06 NOTE — Op Note (Signed)
NAME:  Christina Lewis, Christina Lewis               ACCOUNT NO.:  [id number]  MEDICAL RECORD NO.:  [id number]  LOCATION:                                FACILITY:  [facility]  PHYSICIAN:  W. D. Elianah Karis, M.D.    DATE OF BIRTH:  08/11/1955  DATE OF PROCEDURE: DATE OF DISCHARGE:  08/06/2013                              OPERATIVE REPORT   INDICATION:  A 57-year-old, MRI proven right knee torn medial meniscus, thought to be amenable outpatient surgery.  PREOPERATIVE DIAGNOSES: 1. Complex tear, right medial meniscus. 2. 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 3. Grade 3 chondromalacia, medial femoral condyle.  POSTOPERATIVE DIAGNOSES: 1. Complex tear, right medial meniscus. 2. Grade 2 chondromalacia patellofemoral joint. 3. Grade 3 chondromalacia, medial femoral condyle.  OPERATION: 1. Partial medial meniscectomy. 2. Debridement chondroplasty medial compartment patellofemoral joint.  SURGEON:  W. D. Arnetra Terris, MD  ANESTHESIA:  General with a local supplementation.  DESCRIPTION OF PROCEDURE:  Supine position in leg holder.  No tourniquet.  General anesthetic.  Arthroscope through an inferior mid inferior lateral portal, mild patellofemoral changes were noted with grade 2 changes being debrided lateral compartment __________ lateral meniscus.  Medial meniscus complex tear, requiring resection of the posterior horn estimated about 40% of meniscus was removed.  Tibia was by and large __________ with some very slight degenerative change which was debrided more generalized, early grade 3 changes about 50% of the weightbearing surface of the femur which were debrided as well.  Knee drained free of fluid.  Portal was closed with nylon.  Joint was infiltrated with 40 mg Depo-Medrol with 0.5% Marcaine and total of 30 mL of Marcaine was used including intraarticular and blocking the portals.  Lightly compressive sterile dressing applied, taken to recovery room in stable condition.     W. D.  Felesia Stahlecker, M.D.     WDC/MEDQ  D:  08/06/2013  T:  08/06/2013  Job:  [id number] 

## 2013-08-06 NOTE — Op Note (Deleted)
NAME:  Christina Lewis, AUNE NO.:  [id number]  MEDICAL RECORD NO.:  [id number]  LOCATION:                                FACILITY:  [facility]  PHYSICIAN:  Dyke Brackett, M.D.    DATE OF BIRTH:  [birth year]  DATE OF PROCEDURE: DATE OF DISCHARGE:  08/06/2013                              OPERATIVE REPORT   INDICATION:  A 58 year old, MRI proven right knee torn medial meniscus, thought to be amenable outpatient surgery.  PREOPERATIVE DIAGNOSES: 1. Complex tear, right medial meniscus. 2. 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 3. Grade 3 chondromalacia, medial femoral condyle.  POSTOPERATIVE DIAGNOSES: 1. Complex tear, right medial meniscus. 2. Grade 2 chondromalacia patellofemoral joint. 3. Grade 3 chondromalacia, medial femoral condyle.  OPERATION: 1. Partial medial meniscectomy. 2. Debridement chondroplasty medial compartment patellofemoral joint.  SURGEON:  Dyke Brackett, MD  ANESTHESIA:  General with a local supplementation.  DESCRIPTION OF PROCEDURE:  Supine position in leg holder.  No tourniquet.  General anesthetic.  Arthroscope through an inferior mid inferior lateral portal, mild patellofemoral changes were noted with grade 2 changes being debrided lateral compartment __________ lateral meniscus.  Medial meniscus complex tear, requiring resection of the posterior horn estimated about 40% of meniscus was removed.  Tibia was by and large __________ with some very slight degenerative change which was debrided more generalized, early grade 3 changes about 50% of the weightbearing surface of the femur which were debrided as well.  Knee drained free of fluid.  Portal was closed with nylon.  Joint was infiltrated with 40 mg Depo-Medrol with 0.5% Marcaine and total of 30 mL of Marcaine was used including intraarticular and blocking the portals.  Lightly compressive sterile dressing applied, taken to recovery room in stable condition.     Dyke Brackett, M.D.     WDC/MEDQ  D:  08/06/2013  T:  08/06/2013  Job:  [id number]

## 2013-08-06 NOTE — H&P (View-Only) (Signed)
Christina Lewis is an 58 y.o. female.   Chief Complaint: right knee pain HPI: patient of dr Charlett Blake and devashaur referred to dr caffrey with right knee medial meniscus tear.  Failed conservative treatments discussed outpatient arthroscopy.  ROS: 10 point review of systems reviewed positive for headache, right knee pain, myalgias, otherwise no significant findings.  Past Medical History  Diagnosis Date  . Fibromyalgia   . Diabetes mellitus   . Arthritis   . Sleep apnea   . Gout   . Osteoarthritis   . Migraines   . PONV (postoperative nausea and vomiting)     Past Surgical History  Procedure Laterality Date  . Tonsillectomy    . Appendectomy    . Shoulder surgery  2011    right shoulder  . Oophorectomy    . Nasal sinus surgery      x4  . Breast lumpectomy  2008    lt-negative  . Cholecystectomy  1985  . Abdominal hysterectomy  2000  . Colonoscopy      No family history on file. Social History:  reports that she has never smoked. She does not have any smokeless tobacco history on file. She reports that she does not drink alcohol or use illicit drugs.  Allergies:  Allergies  Allergen Reactions  . Sulfa Drugs Cross Reactors Anaphylaxis  . Codeine Itching  . Erythromycin Nausea And Vomiting  . Morphine And Related Itching     (Not in a hospital admission)  No results found for this or any previous visit (from the past 48 hour(s)). No results found.  ROS  There were no vitals taken for this visit. Physical Exam  Constitutional: She is oriented to person, place, and time. She appears well-developed and well-nourished. No distress.  HENT:  Head: Normocephalic and atraumatic.  Nose: Nose normal.  Eyes: Conjunctivae are normal. Pupils are equal, round, and reactive to light.  Neck: Normal range of motion. Neck supple.  Cardiovascular: Normal rate, regular rhythm and intact distal pulses.   Respiratory: Effort normal. No respiratory distress.  GI: Soft. She  exhibits no distension. There is no tenderness.  Lymphadenopathy:    She has no cervical adenopathy.  Neurological: She is alert and oriented to person, place, and time.  Skin: Skin is warm and dry.  Psychiatric: She has a normal mood and affect. Her behavior is normal.     Assessment/Plan Right knee medial meniscus tear  Discussed risks and benefits of outpatient right knee arthroscopy debridement medial menisectomy and patient wishes to proceed.  This will [facility] done at [facility] to be given post op for pain.    Margart Sickles 08/05/2013, 9:23 PM

## 2013-08-06 NOTE — Interval H&P Note (Signed)
History and Physical Interval Note:  08/06/2013 7:31 AM  Christina Lewis  has presented today for surgery, with the diagnosis of RIGHT DISLOCAATION OF KNEE, TEAR OF MEDIAL CARTILAGE OR MENISCUS OF KNEE CURRENT, CHONDROMALACIA-PATELLA      The various methods of treatment have been discussed with the patient and family. After consideration of risks, benefits and other options for treatment, the patient has consented to  Procedure(s): RIGHT KNEE ARTHROSCOPY WITH MENISCECTOMY MEDIAL, ARTHROSCOPY KNEE WITH DEBRIDEMENT/SHAVING (CHONDROPLASTY)  (Right) as a surgical intervention .  The patient's history has been reviewed, patient examined, no change in status, stable for surgery.  I have reviewed the patient's chart and labs.  Questions were answered to the patient's satisfaction.     Mercy Malena JR,W D

## 2013-08-07 ENCOUNTER — Encounter (HOSPITAL_BASED_OUTPATIENT_CLINIC_OR_DEPARTMENT_OTHER): Payer: Self-pay | Admitting: Orthopedic Surgery

## 2013-08-21 ENCOUNTER — Encounter (HOSPITAL_BASED_OUTPATIENT_CLINIC_OR_DEPARTMENT_OTHER): Payer: Self-pay | Admitting: *Deleted

## 2013-08-21 ENCOUNTER — Other Ambulatory Visit: Payer: Self-pay | Admitting: Physician Assistant

## 2013-08-21 NOTE — H&P (Signed)
Christina Lewis is an 58 y.o. female.   Chief Complaint: right knee draining wound HPI: pt is s/p Right knee arthroscopy 08/06/13 has continue to have serous drainage for incision concerning for possible infection treated on antibiotics prophylactically.    Past Medical History  Diagnosis Date  . Fibromyalgia   . Diabetes mellitus   . Arthritis   . Sleep apnea   . Gout   . Osteoarthritis   . Migraines   . PONV (postoperative nausea and vomiting)     Past Surgical History  Procedure Laterality Date  . Tonsillectomy    . Appendectomy    . Shoulder surgery  2011    right shoulder  . Oophorectomy    . Nasal sinus surgery      x4  . Breast lumpectomy  2008    lt-negative  . Cholecystectomy  1985  . Abdominal hysterectomy  2000  . Colonoscopy    . Knee arthroscopy with patella reconstruction Right 08/06/2013    Procedure: RIGHT KNEE ARTHROSCOPY WITH MENISCECTOMY MEDIAL, ARTHROSCOPY KNEE WITH DEBRIDEMENT/SHAVING (CHONDROPLASTY) ;  Surgeon: Thera Flake., MD;  Location: [facility];  Service: Orthopedics;  Laterality: Right;    No family history on file. Social History:  reports that she has never smoked. She does not have any smokeless tobacco history on file. She reports that she does not drink alcohol or use illicit drugs.  Allergies:  Allergies  Allergen Reactions  . Sulfa Drugs Cross Reactors Anaphylaxis  . Codeine Itching  . Erythromycin Nausea And Vomiting  . Morphine And Related Itching     (Not in a hospital admission)  No results found for this or any previous visit (from the past 48 hour(s)). No results found.  Review of Systems  Constitutional: Negative.   HENT: Negative for congestion, ear discharge, ear pain, hearing loss, nosebleeds, sore throat and tinnitus.   Eyes: Negative.   Respiratory: Negative.  Negative for stridor.   Cardiovascular: Negative.   Gastrointestinal: Negative.   Genitourinary: Negative.   Musculoskeletal: Positive  for joint pain and myalgias. Negative for falls.  Skin: Negative.   Neurological: Positive for headaches. Negative for dizziness, tingling, tremors, sensory change, speech change, focal weakness, seizures and loss of consciousness.  Endo/Heme/Allergies: Negative.   Psychiatric/Behavioral: Negative.     There were no vitals taken for this visit. Physical Exam  Constitutional: She is oriented to person, place, and time. She appears well-developed and well-nourished. No distress.  HENT:  Head: Normocephalic and atraumatic.  Nose: Nose normal.  Eyes: Conjunctivae and EOM are normal. Pupils are equal, round, and reactive to light.  Neck: Normal range of motion. Neck supple.  Cardiovascular: Normal rate, regular rhythm and intact distal pulses.   Respiratory: Effort normal. No respiratory distress.  GI: Soft. She exhibits no distension. There is no tenderness.  Musculoskeletal:       Right knee: She exhibits swelling and erythema. She exhibits no ecchymosis. Tenderness found.  Lymphadenopathy:    She has no cervical adenopathy.  Neurological: She is alert and oriented to person, place, and time. No cranial nerve deficit.  Skin: Skin is warm and dry. No rash noted. No erythema.  Psychiatric: She has a normal mood and affect. Her behavior is normal.     Assessment/Plan Right knee draining wound s/p arthroscopy  Patient with persistent draining wound from knee arthroscopy erythema, recommend right knee lavage possible I&D. Risks and benefits discussed and patient wishes to proceed.  [facility].    Margart Sickles 08/21/2013, 1:57 PM

## 2013-08-21 NOTE — Progress Notes (Signed)
Pt here 08/06/13-infection in knee-coming for i/d-ekg and bmet dome 08/02/13-no new labs needed To bring cpap-and will use post op

## 2013-08-22 ENCOUNTER — Encounter (HOSPITAL_BASED_OUTPATIENT_CLINIC_OR_DEPARTMENT_OTHER)
Admission: RE | Disposition: A | Discharge status: Home / Self Care | Payer: Self-pay | Source: Ambulatory Visit | Attending: Orthopedic Surgery

## 2013-08-22 ENCOUNTER — Ambulatory Visit (HOSPITAL_BASED_OUTPATIENT_CLINIC_OR_DEPARTMENT_OTHER): Payer: BC Managed Care – PPO | Admitting: Certified Registered"

## 2013-08-22 ENCOUNTER — Encounter (HOSPITAL_BASED_OUTPATIENT_CLINIC_OR_DEPARTMENT_OTHER): Payer: BC Managed Care – PPO | Admitting: Certified Registered"

## 2013-08-22 ENCOUNTER — Ambulatory Visit (HOSPITAL_BASED_OUTPATIENT_CLINIC_OR_DEPARTMENT_OTHER)
Admission: RE | Admit: 2013-08-22 | Discharge: 2013-08-22 | Disposition: A | Discharge status: Home / Self Care | Payer: BC Managed Care – PPO | Source: Ambulatory Visit | Attending: Orthopedic Surgery | Admitting: Orthopedic Surgery

## 2013-08-22 ENCOUNTER — Encounter (HOSPITAL_BASED_OUTPATIENT_CLINIC_OR_DEPARTMENT_OTHER): Payer: Self-pay

## 2013-08-22 DIAGNOSIS — M1990 Unspecified osteoarthritis, unspecified site: Secondary | ICD-10-CM | POA: Insufficient documentation

## 2013-08-22 DIAGNOSIS — T8140XA Infection following a procedure, unspecified, initial encounter: Secondary | ICD-10-CM | POA: Insufficient documentation

## 2013-08-22 DIAGNOSIS — IMO0001 Reserved for inherently not codable concepts without codable children: Secondary | ICD-10-CM | POA: Insufficient documentation

## 2013-08-22 DIAGNOSIS — G4730 Sleep apnea, unspecified: Secondary | ICD-10-CM | POA: Insufficient documentation

## 2013-08-22 DIAGNOSIS — Y838 Other surgical procedures as the cause of abnormal reaction of the patient, or of later complication, without mention of misadventure at the time of the procedure: Secondary | ICD-10-CM | POA: Insufficient documentation

## 2013-08-22 DIAGNOSIS — M129 Arthropathy, unspecified: Secondary | ICD-10-CM | POA: Insufficient documentation

## 2013-08-22 DIAGNOSIS — E119 Type 2 diabetes mellitus without complications: Secondary | ICD-10-CM | POA: Insufficient documentation

## 2013-08-22 DIAGNOSIS — M109 Gout, unspecified: Secondary | ICD-10-CM | POA: Insufficient documentation

## 2013-08-22 DIAGNOSIS — G43909 Migraine, unspecified, not intractable, without status migrainosus: Secondary | ICD-10-CM | POA: Insufficient documentation

## 2013-08-22 HISTORY — PX: KNEE ARTHROSCOPY: SHX127

## 2013-08-22 LAB — GLUCOSE, CAPILLARY
GLUCOSE-CAPILLARY: 94 mg/dL (ref 70–99)
Glucose-Capillary: 103 mg/dL — ABNORMAL HIGH (ref 70–99)

## 2013-08-22 LAB — POCT HEMOGLOBIN-HEMACUE: Hemoglobin: 13.3 g/dL (ref 12.0–15.0)

## 2013-08-22 SURGERY — ARTHROSCOPY, KNEE
Anesthesia: General | Site: Knee | Laterality: Right

## 2013-08-22 MED ORDER — CEFAZOLIN SODIUM-DEXTROSE 2-3 GM-% IV SOLR
INTRAVENOUS | Status: AC
  Filled 2013-08-22: qty 50

## 2013-08-22 MED ORDER — HYDROMORPHONE HCL PF 1 MG/ML IJ SOLN
INTRAMUSCULAR | Status: AC
  Filled 2013-08-22: qty 1

## 2013-08-22 MED ORDER — HYDROMORPHONE HCL PF 1 MG/ML IJ SOLN
0.2500 mg | INTRAMUSCULAR | Status: DC | PRN
  Administered 2013-08-22: 0.5 mg via INTRAVENOUS

## 2013-08-22 MED ORDER — MIDAZOLAM HCL 2 MG/2ML IJ SOLN: 1.0000 mg | INTRAMUSCULAR | Status: DC | PRN

## 2013-08-22 MED ORDER — OXYCODONE HCL 5 MG PO TABS
5.0000 mg | ORAL_TABLET | Freq: Once | ORAL | Status: AC | PRN
  Administered 2013-08-22: 5 mg via ORAL

## 2013-08-22 MED ORDER — SODIUM CHLORIDE 0.9 % IV SOLN: INTRAVENOUS | Status: DC

## 2013-08-22 MED ORDER — MIDAZOLAM HCL 2 MG/2ML IJ SOLN
INTRAMUSCULAR | Status: AC
  Filled 2013-08-22: qty 2

## 2013-08-22 MED ORDER — ONDANSETRON HCL 4 MG/2ML IJ SOLN
INTRAMUSCULAR | Status: DC | PRN
  Administered 2013-08-22: 4 mg via INTRAVENOUS

## 2013-08-22 MED ORDER — PROPOFOL 10 MG/ML IV BOLUS
INTRAVENOUS | Status: DC | PRN
  Administered 2013-08-22: 200 mg via INTRAVENOUS

## 2013-08-22 MED ORDER — CEFAZOLIN SODIUM-DEXTROSE 2-3 GM-% IV SOLR
2.0000 g | INTRAVENOUS | Status: AC
  Administered 2013-08-22: 2 g via INTRAVENOUS

## 2013-08-22 MED ORDER — EPINEPHRINE HCL 1 MG/ML IJ SOLN
INTRAMUSCULAR | Status: AC
  Filled 2013-08-22: qty 1

## 2013-08-22 MED ORDER — DEXAMETHASONE SODIUM PHOSPHATE 10 MG/ML IJ SOLN
INTRAMUSCULAR | Status: DC | PRN
  Administered 2013-08-22: 4 mg via INTRAVENOUS

## 2013-08-22 MED ORDER — SODIUM CHLORIDE 0.9 % IR SOLN
Status: DC | PRN
  Administered 2013-08-22: 6000 mL

## 2013-08-22 MED ORDER — FENTANYL CITRATE 0.05 MG/ML IJ SOLN
INTRAMUSCULAR | Status: DC | PRN
  Administered 2013-08-22: 100 ug via INTRAVENOUS
  Administered 2013-08-22 (×2): 50 ug via INTRAVENOUS

## 2013-08-22 MED ORDER — FENTANYL CITRATE 0.05 MG/ML IJ SOLN
INTRAMUSCULAR | Status: AC
  Filled 2013-08-22: qty 6

## 2013-08-22 MED ORDER — CHLORHEXIDINE GLUCONATE 4 % EX LIQD: 60.0000 mL | Freq: Once | CUTANEOUS | Status: DC

## 2013-08-22 MED ORDER — OXYCODONE HCL 5 MG/5ML PO SOLN
5.0000 mg | Freq: Once | ORAL | Status: AC | PRN
Start: 2013-08-22 — End: 2013-08-22

## 2013-08-22 MED ORDER — LIDOCAINE HCL (CARDIAC) 20 MG/ML IV SOLN
INTRAVENOUS | Status: DC | PRN
  Administered 2013-08-22: 100 mg via INTRAVENOUS

## 2013-08-22 MED ORDER — ONDANSETRON HCL 4 MG/2ML IJ SOLN: 4.0000 mg | Freq: Once | INTRAMUSCULAR | Status: DC | PRN

## 2013-08-22 MED ORDER — OXYCODONE-ACETAMINOPHEN 5-325 MG PO TABS: 1.0000 | ORAL_TABLET | ORAL | Status: DC | PRN

## 2013-08-22 MED ORDER — BUPIVACAINE-EPINEPHRINE 0.5% -1:200000 IJ SOLN
INTRAMUSCULAR | Status: DC | PRN
  Administered 2013-08-22: 20 mL

## 2013-08-22 MED ORDER — LACTATED RINGERS IV SOLN
INTRAVENOUS | Status: DC
  Administered 2013-08-22 (×2): via INTRAVENOUS

## 2013-08-22 MED ORDER — FENTANYL CITRATE 0.05 MG/ML IJ SOLN: 50.0000 ug | INTRAMUSCULAR | Status: DC | PRN

## 2013-08-22 MED ORDER — OXYCODONE HCL 5 MG PO TABS
ORAL_TABLET | ORAL | Status: AC
Start: 2013-08-22 — End: 2013-08-22
  Filled 2013-08-22: qty 1

## 2013-08-22 MED ORDER — MIDAZOLAM HCL 5 MG/5ML IJ SOLN
INTRAMUSCULAR | Status: DC | PRN
  Administered 2013-08-22: 2 mg via INTRAVENOUS

## 2013-08-22 SURGICAL SUPPLY — 44 items
BANDAGE ELASTIC 6 VELCRO ST LF (GAUZE/BANDAGES/DRESSINGS) IMPLANT
BANDAGE ESMARK 6X9 LF (GAUZE/BANDAGES/DRESSINGS) IMPLANT
BLADE 4.2CUDA (BLADE) ×1 IMPLANT
BLADE CUDA 5.5 (BLADE) IMPLANT
BLADE CUDA GRT WHITE 3.5 (BLADE) IMPLANT
BLADE CUDA SHAVER 3.5 (BLADE) IMPLANT
BLADE CUTTER MENIS 5.5 (BLADE) IMPLANT
BLADE GREAT WHITE 4.2 (BLADE) IMPLANT
BNDG CMPR 9X6 STRL LF SNTH (GAUZE/BANDAGES/DRESSINGS)
BNDG ESMARK 6X9 LF (GAUZE/BANDAGES/DRESSINGS)
BNDG GAUZE ELAST 4 BULKY (GAUZE/BANDAGES/DRESSINGS) ×2 IMPLANT
BRUSH SCRUB EZ PLAIN DRY (MISCELLANEOUS) ×2 IMPLANT
CANISTER SUCT 3000ML (MISCELLANEOUS) IMPLANT
CUTTER MENISCUS  4.2MM (BLADE)
CUTTER MENISCUS 4.2MM (BLADE) IMPLANT
DRAPE ARTHROSCOPY W/POUCH 114 (DRAPES) ×2 IMPLANT
DRSG EMULSION OIL 3X3 NADH (GAUZE/BANDAGES/DRESSINGS) ×2 IMPLANT
DURAPREP 26ML APPLICATOR (WOUND CARE) ×2 IMPLANT
GAUZE SPONGE 4X4 12PLY STRL (GAUZE/BANDAGES/DRESSINGS) ×2 IMPLANT
GLOVE BIO SURGEON STRL SZ 6.5 (GLOVE) ×1 IMPLANT
GLOVE BIOGEL M STRL SZ7.5 (GLOVE) ×1 IMPLANT
GLOVE BIOGEL PI IND STRL 8 (GLOVE) ×2 IMPLANT
GLOVE BIOGEL PI INDICATOR 8 (GLOVE) ×2
GLOVE SURG ORTHO 8.0 STRL STRW (GLOVE) ×2 IMPLANT
GOWN STRL REUS W/ TWL LRG LVL3 (GOWN DISPOSABLE) ×1 IMPLANT
GOWN STRL REUS W/ TWL XL LVL3 (GOWN DISPOSABLE) ×1 IMPLANT
GOWN STRL REUS W/TWL LRG LVL3 (GOWN DISPOSABLE) ×4
GOWN STRL REUS W/TWL XL LVL3 (GOWN DISPOSABLE) ×2
HOLDER KNEE FOAM BLUE (MISCELLANEOUS) ×2 IMPLANT
KNEE WRAP E Z 3 GEL PACK (MISCELLANEOUS) ×2 IMPLANT
MANIFOLD NEPTUNE II (INSTRUMENTS) ×2 IMPLANT
NDL SAFETY ECLIPSE 18X1.5 (NEEDLE) ×1 IMPLANT
NEEDLE HYPO 18GX1.5 SHARP (NEEDLE) ×2
PACK ARTHROSCOPY DSU (CUSTOM PROCEDURE TRAY) ×2 IMPLANT
PACK BASIN DAY SURGERY FS (CUSTOM PROCEDURE TRAY) ×2 IMPLANT
SET ARTHROSCOPY TUBING (MISCELLANEOUS) ×2
SET ARTHROSCOPY TUBING LN (MISCELLANEOUS) ×1 IMPLANT
SUT ETHILON 4 0 PS 2 18 (SUTURE) ×2 IMPLANT
SYR 5ML LL (SYRINGE) ×2 IMPLANT
TOWEL OR 17X24 6PK STRL BLUE (TOWEL DISPOSABLE) ×2 IMPLANT
WAND 3.0 CAPSURE 30 DEG W/CORD (SURGICAL WAND) IMPLANT
WAND 30 DEG SABER W/CORD (SURGICAL WAND) IMPLANT
WAND STAR VAC 90 (SURGICAL WAND) IMPLANT
WATER STERILE IRR 1000ML POUR (IV SOLUTION) ×2 IMPLANT

## 2013-08-22 NOTE — H&P (View-Only) (Signed)
To go to [facility] for bmet-ekg-to bring cpap dos and use post op  

## 2013-08-22 NOTE — Discharge Instructions (Signed)
Diet: As you were doing prior to hospitalization   Activity: Increase activity slowly as tolerated  No lifting or driving for 2 days  Shower: May shower without a dressing on post op day #2, NO SOAKING in tub   Dressing: You may change your dressing on post op day #2.  Then change the dressing daily with sterile 4"x4"s gauze dressing   Weight Bearing: weight bearing as tolerated.  To prevent constipation: you may use a stool softener such as -  Colace ( over the counter) 100 mg by mouth twice a day  Drink plenty of fluids ( prune juice may be helpful) and high fiber foods  Miralax ( over the counter) for constipation as needed.   Precautions: If you experience chest pain or shortness of breath - call 911 immediately For transfer to the hospital emergency department!!  If you develop a fever greater that 101 F, purulent drainage from wound, increased redness or drainage from wound, or calf pain -- Call the office   Follow- Up Appointment: Please call for an appointment to be seen in 5-7 days [place] - [phone]   Post Anesthesia Home Care Instructions  Activity: Get plenty of rest for the remainder of the day. A responsible adult should stay with you for 24 hours following the procedure.  For the next 24 hours, DO NOT: -Drive a car -Advertising copywriter -Drink alcoholic beverages -Take any medication unless instructed by your physician -Make any legal decisions or sign important papers.  Meals: Start with liquid foods such as gelatin or soup. Progress to regular foods as tolerated. Avoid greasy, spicy, heavy foods. If nausea and/or vomiting occur, drink only clear liquids until the nausea and/or vomiting subsides. Call your physician if vomiting continues.  Special Instructions/Symptoms: Your throat may feel dry or sore from the anesthesia or the breathing tube placed in your throat during surgery. If this causes discomfort, gargle with warm salt water. The discomfort should  disappear within 24 hours.

## 2013-08-22 NOTE — Anesthesia Postprocedure Evaluation (Signed)
  Anesthesia Post-op Note  Patient: Christina Lewis  Procedure(s) Performed: Procedure(s): ARTHROSCOPY RIGHT KNEE FOR INFECTION LAVAGE AND DRAINAGE (Right)  Patient Location: PACU  Anesthesia Type:General  Level of Consciousness: awake, alert  and oriented  Airway and Oxygen Therapy: Patient Spontanous Breathing  Post-op Pain: mild  Post-op Assessment: Post-op Vital signs reviewed  Post-op Vital Signs: Reviewed  Last Vitals:  Filed Vitals:   08/22/13 1450  BP: 138/82  Pulse: 93  Temp: 36.4 C  Resp: 16    Complications: No apparent anesthesia complications

## 2013-08-22 NOTE — Brief Op Note (Signed)
08/22/2013  1:15 PM  PATIENT:  Christina Lewis  58 y.o. female  PRE-OPERATIVE DIAGNOSIS:  Right Knee : Infection Other Postoperative Infection  POST-OPERATIVE DIAGNOSIS:  Right knee: possible postoperative infection  PROCEDURE:  Procedure(s): ARTHROSCOPY RIGHT KNEE FOR INFECTION LAVAGE AND DRAINAGE (Right)  SURGEON:  Surgeon(s) and Role:    * W D Carloyn Manner., MD - Primary  PHYSICIAN ASSISTANT:   ASSISTANTS: none   ANESTHESIA:   local and general  EBL:  Total I/O In: 200 [I.V.:200] Out: -   BLOOD ADMINISTERED:none  DRAINS: none   LOCAL MEDICATIONS USED:  MARCAINE     SPECIMEN:  No Specimen  DISPOSITION OF SPECIMEN:  N/A  COUNTS:  YES  TOURNIQUET:  * No tourniquets in log *  DICTATION: .Dragon Dictation and Other Dictation: Dictation Number unknown  PLAN OF CARE: Discharge to home after PACU  PATIENT DISPOSITION:  PACU - hemodynamically stable.   Delay start of Pharmacological VTE agent (>24hrs) due to surgical blood loss or risk of bleeding: not applicable

## 2013-08-22 NOTE — Interval H&P Note (Signed)
History and Physical Interval Note:  08/22/2013 12:22 PM  Christina Lewis  has presented today for surgery, with the diagnosis of Right Knee : Infection Other Postoperative Infection  The various methods of treatment have been discussed with the patient and family. After consideration of risks, benefits and other options for treatment, the patient has consented to  Procedure(s): ARTHROSCOPY RIGHT KNEE FOR INFECTION LAVAGE AND DRAINAGE (Right) as a surgical intervention .  The patient's history has been reviewed, patient examined, no change in status, stable for surgery.  I have reviewed the patient's chart and labs.  Questions were answered to the patient's satisfaction.     Meir Elwood JR,W D

## 2013-08-22 NOTE — Transfer of Care (Signed)
Immediate Anesthesia Transfer of Care Note  Patient: Christina Lewis  Procedure(s) Performed: Procedure(s): ARTHROSCOPY RIGHT KNEE FOR INFECTION LAVAGE AND DRAINAGE (Right)  Patient Location: PACU  Anesthesia Type:General  Level of Consciousness: sedated  Airway & Oxygen Therapy: Patient Spontanous Breathing and Patient connected to face mask oxygen  Post-op Assessment: Report given to PACU RN and Post -op Vital signs reviewed and stable  Post vital signs: Reviewed and stable  Complications: No apparent anesthesia complications

## 2013-08-22 NOTE — Anesthesia Procedure Notes (Signed)
Procedure Name: LMA Insertion Date/Time: 08/22/2013 12:39 PM Performed by: Burna Cash Pre-anesthesia Checklist: Patient identified, Emergency Drugs available, Suction available and Patient being monitored Patient Re-evaluated:Patient Re-evaluated prior to inductionOxygen Delivery Method: Circle System Utilized Preoxygenation: Pre-oxygenation with 100% oxygen Intubation Type: IV induction Ventilation: Mask ventilation without difficulty LMA: LMA inserted LMA Size: 4.0 Number of attempts: 1 Airway Equipment and Method: bite block Placement Confirmation: positive ETCO2 Tube secured with: Tape Dental Injury: Teeth and Oropharynx as per pre-operative assessment

## 2013-08-22 NOTE — Anesthesia Preprocedure Evaluation (Signed)
Anesthesia Evaluation  Patient identified by MRN, date of birth, ID band Patient awake    Reviewed: Allergy & Precautions, H&P , NPO status , Patient's Chart, lab work & pertinent test results  History of Anesthesia Complications (+) PONV  Airway Mallampati: I TM Distance: >3 FB Neck ROM: Full    Dental  (+) Teeth Intact, Dental Advisory Given, Loose   Pulmonary sleep apnea and Continuous Positive Airway Pressure Ventilation ,  breath sounds clear to auscultation        Cardiovascular Rhythm:Regular Rate:Normal     Neuro/Psych    GI/Hepatic   Endo/Other  diabetes, Well Controlled, Type 2, Oral Hypoglycemic Agents  Renal/GU      Musculoskeletal   Abdominal   Peds  Hematology   Anesthesia Other Findings   Reproductive/Obstetrics                           Anesthesia Physical Anesthesia Plan  ASA: II  Anesthesia Plan: General   Post-op Pain Management:    Induction: Intravenous  Airway Management Planned: LMA  Additional Equipment:   Intra-op Plan:   Post-operative Plan: Extubation in OR  Informed Consent: I have reviewed the patients History and Physical, chart, labs and discussed the procedure including the risks, benefits and alternatives for the proposed anesthesia with the patient or authorized representative who has indicated his/her understanding and acceptance.   Dental advisory given  Plan Discussed with: Anesthesiologist, CRNA and Surgeon  Anesthesia Plan Comments:         Anesthesia Quick Evaluation

## 2013-08-22 NOTE — Interval H&P Note (Signed)
History and Physical Interval Note:  08/22/2013 12:23 PM  Christina Lewis  has presented today for surgery, with the diagnosis of Right Knee : Infection Other Postoperative Infection  The various methods of treatment have been discussed with the patient and family. After consideration of risks, benefits and other options for treatment, the patient has consented to  Procedure(s): ARTHROSCOPY RIGHT KNEE FOR INFECTION LAVAGE AND DRAINAGE (Right) as a surgical intervention .  The patient's history has been reviewed, patient examined, no change in status, stable for surgery.  I have reviewed the patient's chart and labs.  Questions were answered to the patient's satisfaction.     Aly Hauser JR,W D

## 2013-08-22 NOTE — H&P (View-Only) (Signed)
Christina Lewis is an 58 y.o. female.   Chief Complaint: right knee draining wound HPI: pt is s/p Right knee arthroscopy 08/06/13 has continue to have serous drainage for incision concerning for possible infection treated on antibiotics prophylactically.    Past Medical History  Diagnosis Date  . Fibromyalgia   . Diabetes mellitus   . Arthritis   . Sleep apnea   . Gout   . Osteoarthritis   . Migraines   . PONV (postoperative nausea and vomiting)     Past Surgical History  Procedure Laterality Date  . Tonsillectomy    . Appendectomy    . Shoulder surgery  2011    right shoulder  . Oophorectomy    . Nasal sinus surgery      x4  . Breast lumpectomy  2008    lt-negative  . Cholecystectomy  1985  . Abdominal hysterectomy  2000  . Colonoscopy    . Knee arthroscopy with patella reconstruction Right 08/06/2013    Procedure: RIGHT KNEE ARTHROSCOPY WITH MENISCECTOMY MEDIAL, ARTHROSCOPY KNEE WITH DEBRIDEMENT/SHAVING (CHONDROPLASTY) ;  Surgeon: Thera Flake., MD;  Location: [facility];  Service: Orthopedics;  Laterality: Right;    No family history on file. Social History:  reports that she has never smoked. She does not have any smokeless tobacco history on file. She reports that she does not drink alcohol or use illicit drugs.  Allergies:  Allergies  Allergen Reactions  . Sulfa Drugs Cross Reactors Anaphylaxis  . Codeine Itching  . Erythromycin Nausea And Vomiting  . Morphine And Related Itching     (Not in a hospital admission)  No results found for this or any previous visit (from the past 48 hour(s)). No results found.  Review of Systems  Constitutional: Negative.   HENT: Negative for congestion, ear discharge, ear pain, hearing loss, nosebleeds, sore throat and tinnitus.   Eyes: Negative.   Respiratory: Negative.  Negative for stridor.   Cardiovascular: Negative.   Gastrointestinal: Negative.   Genitourinary: Negative.   Musculoskeletal: Positive  for joint pain and myalgias. Negative for falls.  Skin: Negative.   Neurological: Positive for headaches. Negative for dizziness, tingling, tremors, sensory change, speech change, focal weakness, seizures and loss of consciousness.  Endo/Heme/Allergies: Negative.   Psychiatric/Behavioral: Negative.     There were no vitals taken for this visit. Physical Exam  Constitutional: She is oriented to person, place, and time. She appears well-developed and well-nourished. No distress.  HENT:  Head: Normocephalic and atraumatic.  Nose: Nose normal.  Eyes: Conjunctivae and EOM are normal. Pupils are equal, round, and reactive to light.  Neck: Normal range of motion. Neck supple.  Cardiovascular: Normal rate, regular rhythm and intact distal pulses.   Respiratory: Effort normal. No respiratory distress.  GI: Soft. She exhibits no distension. There is no tenderness.  Musculoskeletal:       Right knee: She exhibits swelling and erythema. She exhibits no ecchymosis. Tenderness found.  Lymphadenopathy:    She has no cervical adenopathy.  Neurological: She is alert and oriented to person, place, and time. No cranial nerve deficit.  Skin: Skin is warm and dry. No rash noted. No erythema.  Psychiatric: She has a normal mood and affect. Her behavior is normal.     Assessment/Plan Right knee draining wound s/p arthroscopy  Patient with persistent draining wound from knee arthroscopy erythema, recommend right knee lavage possible I&D. Risks and benefits discussed and patient wishes to proceed.  This will be performed  at [facility].    [place], Joshua 08/21/2013, 1:57 PM    

## 2013-08-23 ENCOUNTER — Encounter (HOSPITAL_BASED_OUTPATIENT_CLINIC_OR_DEPARTMENT_OTHER): Payer: Self-pay | Admitting: Orthopedic Surgery

## 2013-08-23 NOTE — Op Note (Signed)
NAME:  Christina Lewis, HUSH               ACCOUNT NO.:  [id number]  MEDICAL RECORD NO.:  [id number]  LOCATION:                               FACILITY:  [facility]  PHYSICIAN:  Dyke Brackett, M.D.    DATE OF BIRTH:  [birth year]  DATE OF PROCEDURE:  08/22/2013 DATE OF DISCHARGE:  08/22/2013                              OPERATIVE REPORT   INDICATIONS:  A 59 year old with a persistent drainage in her knee, is cultured, negative, but it continued to drain at one of the portals in her knee after surgery, and she had had some cellulitis about the knee and had been put on antibiotics prior to the drainage developed, despite the culture negativity, persistent drainage and pain, I felt that the safest course was lavage as an outpatient.  PREOPERATIVE DIAGNOSIS:  Draining knee, rule out infection on the right side.  POSTOPERATIVE DIAGNOSIS:  Draining knee, rule out infection on the right side.  OPERATION:  Arthroscopic lavage of the right knee.  SURGEON:  Dyke Brackett, M.D.  ANESTHESIA:  General anesthetic, local supplementation.  DESCRIPTION OF PROCEDURE:  We opened the old portals, we did obtain culture.  I would describe the fluid is somewhat excessive, but not really purulent.  We sent culture aerobic and anaerobic.  We then lavaged the knee with over 6000 mL of sterile fluid.  We checked the resection site, most pathology from prior resection within the medial aspect of the knee status post medial meniscectomy, posterior horn, as well as debridement chondroplasty mainly on the femoral surface which looked good.  After copious lavage, we closed the portals.  The medial portal was closed with interrupted nylon with multiple nylon on the lateral, and again, note in the record I think this was for the involved right knee.     Dyke Brackett, M.D.     WDC/MEDQ  D:  08/22/2013  T:  08/22/2013  Job:  [id number]

## 2013-08-24 LAB — WOUND CULTURE
CULTURE: NO GROWTH
GRAM STAIN: NONE SEEN

## 2013-08-27 LAB — ANAEROBIC CULTURE: Gram Stain: NONE SEEN

## 2013-11-13 ENCOUNTER — Other Ambulatory Visit: Payer: Self-pay

## 2013-11-13 DIAGNOSIS — Z1231 Encounter for screening mammogram for malignant neoplasm of breast: Secondary | ICD-10-CM

## 2013-12-17 ENCOUNTER — Ambulatory Visit: Payer: BC Managed Care – PPO

## 2013-12-18 ENCOUNTER — Encounter (INDEPENDENT_AMBULATORY_CARE_PROVIDER_SITE_OTHER): Payer: Self-pay

## 2013-12-18 ENCOUNTER — Ambulatory Visit
Admission: RE | Admit: 2013-12-18 | Discharge: 2013-12-18 | Disposition: A | Discharge status: Home / Self Care | Payer: BC Managed Care – PPO | Source: Ambulatory Visit

## 2013-12-18 DIAGNOSIS — Z1231 Encounter for screening mammogram for malignant neoplasm of breast: Secondary | ICD-10-CM

## 2014-02-22 DIAGNOSIS — N3010 Interstitial cystitis (chronic) without hematuria: Secondary | ICD-10-CM

## 2014-02-22 HISTORY — DX: Interstitial cystitis (chronic) without hematuria: N30.10

## 2014-04-02 ENCOUNTER — Other Ambulatory Visit (HOSPITAL_COMMUNITY): Payer: Self-pay | Admitting: Endocrinology

## 2014-04-02 DIAGNOSIS — E049 Nontoxic goiter, unspecified: Secondary | ICD-10-CM

## 2014-04-08 ENCOUNTER — Ambulatory Visit (HOSPITAL_COMMUNITY): Payer: BC Managed Care – PPO

## 2014-04-12 ENCOUNTER — Ambulatory Visit (HOSPITAL_COMMUNITY): Payer: BC Managed Care – PPO

## 2014-04-18 ENCOUNTER — Ambulatory Visit (HOSPITAL_COMMUNITY): Payer: BC Managed Care – PPO

## 2014-04-22 ENCOUNTER — Ambulatory Visit (HOSPITAL_COMMUNITY): Admission: RE | Admit: 2014-04-22 | Payer: BC Managed Care – PPO | Source: Ambulatory Visit

## 2014-04-25 ENCOUNTER — Ambulatory Visit (HOSPITAL_COMMUNITY): Admission: RE | Admit: 2014-04-25 | Payer: BC Managed Care – PPO | Source: Ambulatory Visit

## 2014-05-02 ENCOUNTER — Ambulatory Visit (HOSPITAL_COMMUNITY): Payer: BC Managed Care – PPO

## 2014-05-09 ENCOUNTER — Ambulatory Visit (HOSPITAL_COMMUNITY): Admission: RE | Admit: 2014-05-09 | Payer: BC Managed Care – PPO | Source: Ambulatory Visit

## 2014-07-24 ENCOUNTER — Encounter (HOSPITAL_COMMUNITY): Payer: Self-pay | Admitting: *Deleted

## 2014-07-24 ENCOUNTER — Emergency Department (HOSPITAL_COMMUNITY)
Admission: EM | Admit: 2014-07-24 | Discharge: 2014-07-25 | Disposition: A | Discharge status: Home / Self Care | Payer: BC Managed Care – PPO | Attending: Emergency Medicine | Admitting: Emergency Medicine

## 2014-07-24 DIAGNOSIS — Z79899 Other long term (current) drug therapy: Secondary | ICD-10-CM | POA: Insufficient documentation

## 2014-07-24 DIAGNOSIS — M797 Fibromyalgia: Secondary | ICD-10-CM | POA: Insufficient documentation

## 2014-07-24 DIAGNOSIS — H578 Other specified disorders of eye and adnexa: Secondary | ICD-10-CM | POA: Diagnosis present

## 2014-07-24 DIAGNOSIS — E119 Type 2 diabetes mellitus without complications: Secondary | ICD-10-CM | POA: Insufficient documentation

## 2014-07-24 DIAGNOSIS — Z792 Long term (current) use of antibiotics: Secondary | ICD-10-CM | POA: Diagnosis not present

## 2014-07-24 DIAGNOSIS — G43909 Migraine, unspecified, not intractable, without status migrainosus: Secondary | ICD-10-CM | POA: Insufficient documentation

## 2014-07-24 DIAGNOSIS — H1132 Conjunctival hemorrhage, left eye: Secondary | ICD-10-CM | POA: Diagnosis not present

## 2014-07-24 NOTE — ED Notes (Signed)
Pt states she noticed blood in her eye yesterday, pt was traveling and could not see her doctor, states her left eye is blurry, pt has headache, blood noticed to fill left side of left eye.

## 2014-07-24 NOTE — ED Provider Notes (Signed)
CSN: [id number]     Arrival date & time 07/24/14  2250 History  This chart was scribed for Devoria Albe, MD by Octavia Heir, ED Scribe. This patient was seen in room APA05/APA05 and the patient's care was started at 11:41 PM.    Chief Complaint  Patient presents with  . Eye Problem  . Headache     Patient is a 59 y.o. female presenting with headaches. The history is provided by the patient. No language interpreter was used.  Headache Associated symptoms: eye pain   Associated symptoms: no cough     HPI Comments: Christina Lewis is a 59 y.o. female who presents to the Emergency Department complaining of a constant, gradual worsening eye pain onset 2 days ago. Patient states they were driving to [place] and she had a mild headache on May 30. When she woke up the next morning the sclera of her left eye was red. She states seeing a urgent care doctor in [place] yesterday who advised her she had a ruptured blood vessel and that it would improve prove. Pt reports waking up this morning with associated blurry vision and a headache that radiates to the left side of her head. She notes feeling as if her eye is being pushed out. She has chronic headaches. She states it feels like she has something is in her eye. She has never had this before Pt is on disability for migraines and fibromyalgia. Pt denies taking blood thinners and aspirin, coughing, sneezing. Pt is not a smoker.   PCP: Dr. Sherryll Burger Optometrist Dr Roger Kill in [place]   Past Medical History  Diagnosis Date  . Fibromyalgia   . Diabetes mellitus   . Arthritis   . Sleep apnea   . Gout   . Osteoarthritis   . Migraines   . PONV (postoperative nausea and vomiting)    Past Surgical History  Procedure Laterality Date  . Tonsillectomy    . Appendectomy    . Shoulder surgery  2011    right shoulder  . Oophorectomy    . Nasal sinus surgery      x4  . Breast lumpectomy  2008    lt-negative  . Cholecystectomy  1985  . Abdominal  hysterectomy  2000  . Colonoscopy    . Knee arthroscopy with patella reconstruction Right 08/06/2013    Procedure: RIGHT KNEE ARTHROSCOPY WITH MENISCECTOMY MEDIAL, ARTHROSCOPY KNEE WITH DEBRIDEMENT/SHAVING (CHONDROPLASTY) ;  Surgeon: Thera Flake., MD;  Location: [facility];  Service: Orthopedics;  Laterality: Right;  . Knee arthroscopy Right 08/22/2013    Procedure: ARTHROSCOPY RIGHT KNEE FOR INFECTION LAVAGE AND DRAINAGE;  Surgeon: Thera Flake., MD;  Location: [facility];  Service: Orthopedics;  Laterality: Right;   History reviewed. No pertinent family history. History  Substance Use Topics  . Smoking status: Never Smoker   . Smokeless tobacco: Not on file  . Alcohol Use: No   On disability for migraines and fibromyalgia Lives at home   OB History    No data available     Review of Systems  HENT: Negative for sneezing.   Eyes: Positive for pain, redness and visual disturbance.  Respiratory: Negative for cough.   Neurological: Positive for headaches.  All other systems reviewed and are negative.     Allergies  Sulfa drugs cross reactors; Codeine; Erythromycin; and Morphine and related  Home Medications   Prior to Admission medications   Medication Sig Start Date End Date Taking?  Authorizing Provider  albuterol (PROVENTIL HFA;VENTOLIN HFA) 108 (90 BASE) MCG/ACT inhaler Inhale into the lungs every 6 (six) hours as needed for wheezing or shortness of breath.    Historical Provider, MD  butabarbital (BUTISOL) 50 MG TABS tablet Take 50 mg by mouth as needed.    Historical Provider, MD  Cholecalciferol (VITAMIN D) 2000 UNITS tablet Take 2,000 Units by mouth daily.    Historical Provider, MD  doxycycline (DORYX) 100 MG EC tablet Take 100 mg by mouth 2 (two) times daily.    Historical Provider, MD  DULoxetine (CYMBALTA) 60 MG capsule Take 120 mg by mouth daily.      Historical Provider, MD  eletriptan (RELPAX) 20 MG tablet Take 20 mg by mouth as  needed for migraine or headache. One tablet by mouth at onset of headache. May repeat in 2 hours if headache persists or recurs.    Historical Provider, MD  metFORMIN (GLUCOPHAGE) 1000 MG tablet Take 1,000 mg by mouth at bedtime.    Historical Provider, MD  methocarbamol (ROBAXIN) 500 MG tablet Take 500 mg by mouth 2 (two) times daily.     Historical Provider, MD  nortriptyline (PAMELOR) 75 MG capsule Take 75 mg by mouth at bedtime.    Historical Provider, MD  Omega-3 Fatty Acids (FISH OIL) 1000 MG CAPS Take 1,000 mg by mouth daily.    Historical Provider, MD  oxyCODONE-acetaminophen (ROXICET) 5-325 MG per tablet Take 1-2 tablets by mouth every 4 (four) hours as needed for severe pain. 08/22/13   Margart Sickles, PA-C  risperiDONE (RISPERDAL) 0.25 MG tablet Take 0.25 mg by mouth daily.    Historical Provider, MD  spironolactone (ALDACTONE) 50 MG tablet Take 50 mg by mouth daily.    Historical Provider, MD  tiZANidine (ZANAFLEX) 4 MG tablet Take 8 mg by mouth at bedtime.    Historical Provider, MD  topiramate (TOPAMAX) 200 MG tablet Take 200 mg by mouth at bedtime.     Historical Provider, MD  traMADol (ULTRAM) 50 MG tablet Take by mouth every 6 (six) hours as needed.    Historical Provider, MD   Triage vitals: BP 130/82 mmHg  Pulse 88  Temp(Src) 97.9 F (36.6 C) (Oral)  Resp 20  Ht 5\' 4"  (1.626 m)  Wt 165 lb (74.844 kg)  BMI 28.31 kg/m2  SpO2 99%  Vital signs normal   Physical Exam  Constitutional: She is oriented to person, place, and time. She appears well-developed and well-nourished.  Non-toxic appearance. She does not appear ill. No distress.  HENT:  Head: Normocephalic and atraumatic.  Right Ear: External ear normal.  Left Ear: External ear normal.  Nose: Nose normal. No mucosal edema or rhinorrhea.  Mouth/Throat: Oropharynx is clear and moist and mucous membranes are normal. No dental abscesses or uvula swelling.  Eyes: EOM are normal. Pupils are equal, round, and reactive to  light.  She has a subconjuctivaal hemmorage of lateral left globe. She is noted to have some bulging of the sclera from the subconjunctival hemorrhage.  See picture.  Neck: Normal range of motion and full passive range of motion without pain. Neck supple.  Pulmonary/Chest: No respiratory distress. She has no rhonchi. She exhibits no crepitus.  Abdominal: Normal appearance.  Musculoskeletal: Normal range of motion.  Moves all extremities well.   Neurological: She is alert and oriented to person, place, and time. She has normal strength. No cranial nerve deficit.  Skin: Skin is warm, dry and intact. No rash noted. No erythema. No pallor.  Psychiatric: She has a normal mood and affect. Her speech is normal and behavior is normal. Her mood appears not anxious.  Nursing note and vitals reviewed.     ED Course  Procedures  DIAGNOSTIC STUDIES: Oxygen Saturation is 99% on RA, normal by my interpretation.  COORDINATION OF CARE:  11:45 PM Discussed treatment plan which includes eye test with pt at bedside and pt agreed to plan.   12:29 AM Pt declined migraine medication when offered stating she has medication she can take at home. We discussed that she is feeling the bulging of the sclera from bowel hematoma, and the mild blurred vision is again from a mechanical problem from the swelling of the sclera. At this point I do not suspect anything more then a subconjunctival hematoma. She is advised to use ice packs. She was given the number of ophthalmologists to follow-up with if she chooses.  23:49:04 Visual Acuity RH  Visual Acuity - Bilateral Near: 20/25 ; Bilateral Distance: 20/25 ; R Near: 20/20 ; R Distance: 20/20 ; L Near: 20/30 ; L Distance: 20/40       Labs Review Labs Reviewed - No data to display  Imaging Review No results found.   EKG Interpretation None      MDM   Final diagnoses:  Subconjunctival bleed, left    Plan discharge  Devoria Albe, MD, FACEP   I personally  performed the services described in this documentation, which was scribed in my presence. The recorded information has been reviewed and considered.  Devoria Albe, MD, Concha Pyo, MD 07/25/14 [phone]

## 2014-07-25 NOTE — Discharge Instructions (Signed)
Try ice packs for comfort. The bruising will heal just like a bruise on your arm and go through the color changes. You can be rechecked by Dr Lita Mains who is an ophthalmologist in [place].   Subconjunctival Hemorrhage A subconjunctival hemorrhage is a bright red patch covering a portion of the white of the eye. The white part of the eye is called the sclera, and it is covered by a thin membrane called the conjunctiva. This membrane is clear, except for tiny blood vessels that you can see with the naked eye. When your eye is irritated or inflamed and becomes red, it is because the vessels in the conjunctiva are swollen. Sometimes, a blood vessel in the conjunctiva can break and bleed. When this occurs, the blood builds up between the conjunctiva and the sclera, and spreads out to create a red area. The red spot may be very small at first. It may then spread to cover a larger part of the surface of the eye, or even all of the visible white part of the eye. In almost all cases, the blood will go away and the eye will become white again. Before completely dissolving, however, the red area may spread. It may also become brownish-yellow in color before going away. If a lot of blood collects under the conjunctiva, it may look like a bulge on the surface of the eye. This looks scary, but it will also eventually flatten out and go away. Subconjunctival hemorrhages do not cause pain, but if swollen, may cause a feeling of irritation. There is no effect on vision.  CAUSES   The most common cause is mild trauma (rubbing the eye, irritation).  Subconjunctival hemorrhages can happen because of coughing or straining (lifting heavy objects), vomiting, or sneezing.  In some cases, your doctor may want to check your blood pressure. High blood pressure can also cause a subconjunctival hemorrhage.  Severe trauma or blunt injuries.  Diseases that affect blood clotting (hemophilia, leukemia).  Abnormalities of blood vessels  behind the eye (carotid cavernous sinus fistula).  Tumors behind the eye.  Certain drugs (aspirin, Coumadin, heparin).  Recent eye surgery. HOME CARE INSTRUCTIONS   Do not worry about the appearance of your eye. You may continue your usual activities.  Often, follow-up is not necessary. SEEK MEDICAL CARE IF:   Your eye becomes painful.  The bleeding does not disappear within 3 weeks.  Bleeding occurs elsewhere, for example, under the skin, in the mouth, or in the other eye.  You have recurring subconjunctival hemorrhages. SEEK IMMEDIATE MEDICAL CARE IF:   Your vision changes or you have difficulty seeing.  You develop a severe headache, persistent vomiting, confusion, or abnormal drowsiness (lethargy).  Your eye seems to bulge or protrude from the eye socket.  You notice the sudden appearance of bruises or have spontaneous bleeding elsewhere on your body. Document Released: 02/08/2005 Document Revised: 06/25/2013 Document Reviewed: 01/06/2009 [facility] Patient Information 2015 [place], [place]. This information is not intended to replace advice given to you by your health care provider. Make sure you discuss any questions you have with your health care provider.

## 2014-07-25 NOTE — ED Notes (Signed)
Discharge instructions given, pt demonstrated teach back and verbal understanding. No concerns voiced.  

## 2014-11-18 ENCOUNTER — Other Ambulatory Visit: Payer: Self-pay

## 2014-11-18 DIAGNOSIS — Z1231 Encounter for screening mammogram for malignant neoplasm of breast: Secondary | ICD-10-CM

## 2014-12-23 ENCOUNTER — Ambulatory Visit: Payer: BC Managed Care – PPO

## 2014-12-31 ENCOUNTER — Other Ambulatory Visit (HOSPITAL_COMMUNITY): Payer: Self-pay | Admitting: Internal Medicine

## 2014-12-31 DIAGNOSIS — Z1231 Encounter for screening mammogram for malignant neoplasm of breast: Secondary | ICD-10-CM

## 2015-01-13 ENCOUNTER — Ambulatory Visit (HOSPITAL_COMMUNITY): Payer: BC Managed Care – PPO

## 2015-01-22 ENCOUNTER — Ambulatory Visit (HOSPITAL_COMMUNITY)
Admission: RE | Admit: 2015-01-22 | Discharge: 2015-01-22 | Disposition: A | Discharge status: Home / Self Care | Payer: BC Managed Care – PPO | Source: Ambulatory Visit | Attending: Internal Medicine | Admitting: Internal Medicine

## 2015-01-22 DIAGNOSIS — Z1231 Encounter for screening mammogram for malignant neoplasm of breast: Secondary | ICD-10-CM | POA: Insufficient documentation

## 2015-12-17 ENCOUNTER — Other Ambulatory Visit: Payer: Self-pay | Admitting: Internal Medicine

## 2015-12-17 DIAGNOSIS — Z1231 Encounter for screening mammogram for malignant neoplasm of breast: Secondary | ICD-10-CM

## 2016-01-26 ENCOUNTER — Other Ambulatory Visit (HOSPITAL_COMMUNITY): Payer: Self-pay | Admitting: Endocrinology

## 2016-01-26 ENCOUNTER — Ambulatory Visit: Payer: BC Managed Care – PPO

## 2016-01-26 DIAGNOSIS — E049 Nontoxic goiter, unspecified: Secondary | ICD-10-CM

## 2016-01-27 ENCOUNTER — Ambulatory Visit: Payer: Self-pay | Admitting: Rheumatology

## 2016-01-30 ENCOUNTER — Ambulatory Visit (HOSPITAL_COMMUNITY)
Admission: RE | Admit: 2016-01-30 | Discharge: 2016-01-30 | Disposition: A | Discharge status: Home / Self Care | Payer: BC Managed Care – PPO | Source: Ambulatory Visit | Attending: Endocrinology | Admitting: Endocrinology

## 2016-01-30 ENCOUNTER — Ambulatory Visit (HOSPITAL_COMMUNITY): Payer: BC Managed Care – PPO

## 2016-01-30 DIAGNOSIS — E049 Nontoxic goiter, unspecified: Secondary | ICD-10-CM

## 2016-02-10 DIAGNOSIS — M19041 Primary osteoarthritis, right hand: Secondary | ICD-10-CM | POA: Insufficient documentation

## 2016-02-10 DIAGNOSIS — Z87442 Personal history of urinary calculi: Secondary | ICD-10-CM | POA: Insufficient documentation

## 2016-02-10 DIAGNOSIS — M797 Fibromyalgia: Secondary | ICD-10-CM | POA: Insufficient documentation

## 2016-02-10 DIAGNOSIS — Z8639 Personal history of other endocrine, nutritional and metabolic disease: Secondary | ICD-10-CM | POA: Insufficient documentation

## 2016-02-10 DIAGNOSIS — M19042 Primary osteoarthritis, left hand: Secondary | ICD-10-CM

## 2016-02-10 DIAGNOSIS — R5383 Other fatigue: Secondary | ICD-10-CM | POA: Insufficient documentation

## 2016-02-10 DIAGNOSIS — Z8669 Personal history of other diseases of the nervous system and sense organs: Secondary | ICD-10-CM | POA: Insufficient documentation

## 2016-02-10 NOTE — Progress Notes (Signed)
Office Visit Note  Patient: Christina Lewis             Date of Birth: 1956/01/25           MRN: [id number]             PCP: Kirstie Peri, MD Referring: Kirstie Peri, MD Visit Date: 02/11/2016 Occupation: @GUAROCC @    Subjective:  Pain of the Neck and Muscle Pain Follow-up on fibromyalgia syndrome, fatigue, insomnia, neck pain, restless leg syndrome.  History of Present Illness: Christina Lewis is a 60 y.o. female  Last seen 06/30/2015 Patient's fibromyalgia is about the same as last visit. She rates her discomfort as 6 on a scale of 0-10. Her fatigue is not too bad at this time. She is doing well with her insomnia overall  Her main complaint today is her neck is hurting. She's had good results with cortisone injection. The last cortisone injection that we gave her was May 2017. Note that she has not had any cortisone injections since that visit by any other doctor.  Her second complaint today is that her right leg shakes while she's sleeping in even when she is awake. It is becoming a problem and her husband is complaining about it. She has a relative who is been diagnosed with restless leg syndrome and she feels that she might be having that.  Her knees are currently doing well overall in the past her right knee would hurt from time to time. We discussed Visco supplementation in the future but for this moment she does not require any intervention.  Patient states that she's lost about 20 pounds since the last visit.   Activities of Daily Living:  Patient reports morning stiffness for 15 minutes.   Patient Reports nocturnal pain.  Difficulty dressing/grooming: Denies Difficulty climbing stairs: Denies Difficulty getting out of chair: Denies Difficulty using hands for taps, buttons, cutlery, and/or writing: Denies   Review of Systems  Constitutional: Positive for fatigue.  HENT: Negative for mouth sores and mouth dryness.   Eyes: Negative for dryness.  Respiratory:  Negative for shortness of breath.   Gastrointestinal: Negative for constipation and diarrhea.  Musculoskeletal: Positive for myalgias and myalgias.  Skin: Negative for sensitivity to sunlight.  Psychiatric/Behavioral: Positive for sleep disturbance. Negative for decreased concentration.    PMFS History:  Patient Active Problem List   Diagnosis Date Noted  . Fibromyalgia 02/10/2016  . Other fatigue 02/10/2016  . Primary osteoarthritis of both hands 02/10/2016  . History of migraine 02/10/2016  . History of thyroid nodule 02/10/2016  . History of sleep apnea 02/10/2016  . History of renal calculi 02/10/2016  . Pain in joint, shoulder region 10/02/2010  . Adhesive capsulitis of shoulder 10/02/2010  . Muscle weakness (generalized) 10/02/2010    Past Medical History:  Diagnosis Date  . Arthritis   . Diabetes mellitus   . Fibromyalgia   . Gout   . Migraines   . Osteoarthritis   . PONV (postoperative nausea and vomiting)   . Sleep apnea     No family history on file. Past Surgical History:  Procedure Laterality Date  . ABDOMINAL HYSTERECTOMY  2000  . APPENDECTOMY    . BREAST LUMPECTOMY  2008   lt-negative  . CHOLECYSTECTOMY  1985  . COLONOSCOPY    . KNEE ARTHROSCOPY Right 08/22/2013   Procedure: ARTHROSCOPY RIGHT KNEE FOR INFECTION LAVAGE AND DRAINAGE;  Surgeon: Thera Flake., MD;  Location: [facility];  Service: Orthopedics;  Laterality: Right;  . KNEE ARTHROSCOPY WITH PATELLA RECONSTRUCTION Right 08/06/2013   Procedure: RIGHT KNEE ARTHROSCOPY WITH MENISCECTOMY MEDIAL, ARTHROSCOPY KNEE WITH DEBRIDEMENT/SHAVING (CHONDROPLASTY) ;  Surgeon: Thera Flake., MD;  Location: [facility];  Service: Orthopedics;  Laterality: Right;  . NASAL SINUS SURGERY     x4  . OOPHORECTOMY    . SHOULDER SURGERY  2011   right shoulder  . TONSILLECTOMY     Social History   Social History Narrative  . No narrative on file     Objective: Vital Signs: BP 134/74    Pulse 82   Resp 16   Ht 5\' 4"  (1.626 m)   Wt 152 lb (68.9 kg)   BMI 26.09 kg/m    Physical Exam  Constitutional: She is oriented to person, place, and time. She appears well-developed and well-nourished.  HENT:  Head: Normocephalic and atraumatic.  Eyes: EOM are normal. Pupils are equal, round, and reactive to light.  Cardiovascular: Normal rate, regular rhythm and normal heart sounds.  Exam reveals no gallop and no friction rub.   No murmur heard. Pulmonary/Chest: Effort normal and breath sounds normal. She has no wheezes. She has no rales.  Abdominal: Soft. Bowel sounds are normal. She exhibits no distension. There is no tenderness. There is no guarding. No hernia.  Musculoskeletal: Normal range of motion. She exhibits no edema, tenderness or deformity.  Lymphadenopathy:    She has no cervical adenopathy.  Neurological: She is alert and oriented to person, place, and time. Coordination normal.  Skin: Skin is warm and dry. Capillary refill takes less than 2 seconds. No rash noted.  Psychiatric: She has a normal mood and affect. Her behavior is normal.     Musculoskeletal Exam:  Full range of motion of all joints Grip strength is equal and strong bilaterally Fiber myalgia tender points are 18 out of 18 positive  CDAI Exam: CDAI Homunculus Exam:   Joint Counts:  CDAI Tender Joint count: 0 CDAI Swollen Joint count: 0  Global Assessments:  Patient Global Assessment: 7 Provider Global Assessment: 7  CDAI Calculated Score: 14  No synovitis on examination  Investigation: No additional findings.   Imaging: Thyroid  Result Date: 01/30/2016 CLINICAL DATA:  Goiter. Nontoxic goiter. Follow-up. Left thyroid biopsy in 2014. EXAM: THYROID ULTRASOUND TECHNIQUE: Ultrasound examination of the thyroid gland and adjacent soft tissues was performed. COMPARISON:  11/16/2012 FINDINGS: Parenchymal Echotexture: Mildly heterogenous Estimated total number of nodules >/= 1 cm: 2 Number  of spongiform nodules >/=  2 cm not described below (TR1): 0 Number of mixed cystic and solid nodules >/= 1.5 cm not described below (TR2): 0 _________________________________________________________ Isthmus: 0.3 cm, previously 0.4 cm No discrete nodules are identified within the thyroid isthmus. _________________________________________________________ Right lobe: 4.4 x 1.5 x 1.5 cm, previously 4.1 x 1.2 x 1.6 cm 0.7 cm nodule previously measured 0.8 cm in the upper pole 0.8 cm lower pole nodule previously measured 0.8 cm. _________________________________________________________ Left lobe: 4.6 x 1.7 x 1.6 cm, previously 3.9 x 1.5 x 1.2 cm Nodule # 1: Prior biopsy: Yes Location: Left; Superior Size: 1.3 x 1.1 x 1.0 cm, previously 1.1 x 1.1 x 1.0 cm. Composition: solid/almost completely solid (2) Echogenicity: hypoechoic (2) Shape: not taller-than-wide (0) Margins: smooth (0) Echogenic foci: none (0) ACR TI-RADS total points: 4. ACR TI-RADS risk category: TR4 (4-6 points). Change in features: No Change in ACR TI-RADS risk category: No ACR TI-RADS recommendations: No significant change.  This has been previously  biopsied. Nodule # 2: Prior biopsy: Yes Location: Left; Inferior Size: 2.2 x 1.6 x 1.6 cm, previously 1.6 x 1.5 x 1.5 cm. Composition: solid/almost completely solid (2) Echogenicity: hypoechoic (2) Shape: not taller-than-wide (0) Margins: smooth (0) Echogenic foci: none (0) ACR TI-RADS total points: 4. ACR TI-RADS risk category: TR4 (4-6 points). Change in features: No Change in ACR TI-RADS risk category: No ACR TI-RADS recommendations: This nodule has enlarged since the prior study measuring up to 2.2 cm today and previously measuring 1.6 cm. This has been previously biopsied. IMPRESSION: Small right lobe nodules are stable Nodule in the left upper pole measuring 1.3 cm is not significantly changed. This was previously biopsied Nodule in the lower pole measuring 2.2 cm has enlarged since the prior study.  This has been previously biopsied. The above is in keeping with the ACR TI-RADS recommendations - J Am Coll Radiol 2017;14:587-595. Electronically Signed   By: Jolaine Click M.D.   On: 01/30/2016 15:47    Speciality Comments: No specialty comments available.    Procedures:  Trigger Point Inj Date/Time: 02/11/2016 3:37 PM Performed by: Tawni Pummel Authorized by: Tawni Pummel   Consent Given by:  Patient Site marked: the procedure site was marked   Timeout: prior to procedure the correct patient, procedure, and site was verified   Indications:  Muscle spasm and pain Total # of Trigger Points:  2 Location: neck   Needle Size:  27 G Approach:  Dorsal Medications #1:  0.5 mL lidocaine 1 %; 10 mg triamcinolone acetonide 40 MG/ML Medications #2:  0.5 mL lidocaine 1 %; 10 mg triamcinolone acetonide 40 MG/ML Patient tolerance:  Patient tolerated the procedure well with no immediate complications Comments: Patient stated that she is at least 50% better few minutes after the injections to the trapezius areas.   Allergies: Sulfa drugs cross reactors; Codeine; Erythromycin; and Morphine and related   Assessment / Plan:     Visit Diagnoses: Fibromyalgia  Other fatigue  Trapezius muscle spasm  Primary osteoarthritis of both hands  History of migraine  History of thyroid nodule  History of sleep apnea  Muscle weakness (generalized)  History of renal calculi   Plan: #1: New prescription: Ropinirole 0.5 mg; half tablet daily at bedtime for 1 week; then 1 tablet daily at bedtime thereafter; 30 day supply with 2 refills; I've asked the pharmacy to perhaps give her 0.25 mg for the 1 week if it is hard to cut the 0.5 in half. I've also advises the patient. I've also given her a coupon through [facility] which allows her to get 30 day supply of the medication for about $15  #2: Have given her trapezius muscle injection today. She's had at least 50% improvement couple of minutes after  the injection. Please see procedure note for full details  #3: Patient is not doing well with Robaxin at this time. She is requesting [place] to change it to baclofen. I'm agreeable. New prescription baclofen 10 mg 1 by mouth up to 3 times a day when necessary dispense 90 pills with 2 refills Patient should not take evening dose of baclofen if she is taking Flexeril at night. Patient understands and is agreeable.  #4: No other medications need to be refilled at this time. But she will call us in case she needs anything to be refilled.  Orders: Orders Placed This Encounter  Procedures  . Trigger Point Injection   Meds ordered this encounter  Medications  . rOPINIRole (REQUIP) 0.5 MG tablet  Sig: Take 1 tablet (0.5 mg total) by mouth at bedtime. 1/2 tablet qhs first week, then 1 tablet qhs    Dispense:  30 tablet    Refill:  2    1/2 tablet qhs first week, then 1 tablet qhs; ask if pharmacy can give you 0 .25mg  for first week if it is hard to cut the 0.5mg ;    Order Specific Question:   Supervising Provider    Answer:   [[id number]]  . baclofen (LIORESAL) 10 MG tablet    Sig: Take 1 tablet (10 mg total) by mouth 3 (three) times daily. Avoid the evening dose of baclofen if taking Flexeril at night.    Dispense:  30 each    Refill:  2    Order Specific Question:   Supervising Provider    Answer:   Pollyann Savoy [phone]    Face-to-face time spent with patient was 30 minutes. 50% of time was spent in counseling and coordination of care.  Follow-Up Instructions: Return in about 6 months (around 08/11/2016) for fms, fatigue, insomnia, trap muscle spasm, restless leg syndrome.   08/13/2016, PA-C

## 2016-02-11 ENCOUNTER — Encounter: Payer: Self-pay | Admitting: Rheumatology

## 2016-02-11 ENCOUNTER — Ambulatory Visit (INDEPENDENT_AMBULATORY_CARE_PROVIDER_SITE_OTHER): Payer: BC Managed Care – PPO | Admitting: Rheumatology

## 2016-02-11 VITALS — BP 134/74 | HR 82 | Resp 16 | Ht 64.0 in | Wt 152.0 lb

## 2016-02-11 DIAGNOSIS — M19042 Primary osteoarthritis, left hand: Secondary | ICD-10-CM | POA: Diagnosis not present

## 2016-02-11 DIAGNOSIS — Z87442 Personal history of urinary calculi: Secondary | ICD-10-CM | POA: Diagnosis not present

## 2016-02-11 DIAGNOSIS — M797 Fibromyalgia: Secondary | ICD-10-CM | POA: Diagnosis not present

## 2016-02-11 DIAGNOSIS — M19041 Primary osteoarthritis, right hand: Secondary | ICD-10-CM

## 2016-02-11 DIAGNOSIS — M6281 Muscle weakness (generalized): Secondary | ICD-10-CM | POA: Diagnosis not present

## 2016-02-11 DIAGNOSIS — M62838 Other muscle spasm: Secondary | ICD-10-CM | POA: Diagnosis not present

## 2016-02-11 DIAGNOSIS — Z8669 Personal history of other diseases of the nervous system and sense organs: Secondary | ICD-10-CM | POA: Diagnosis not present

## 2016-02-11 DIAGNOSIS — R5383 Other fatigue: Secondary | ICD-10-CM

## 2016-02-11 DIAGNOSIS — Z8639 Personal history of other endocrine, nutritional and metabolic disease: Secondary | ICD-10-CM

## 2016-02-11 MED ORDER — LIDOCAINE HCL 1 % IJ SOLN
0.5000 mL | INTRAMUSCULAR | Status: AC | PRN
  Administered 2016-02-11: .5 mL

## 2016-02-11 MED ORDER — TRIAMCINOLONE ACETONIDE 40 MG/ML IJ SUSP
10.0000 mg | INTRAMUSCULAR | Status: AC | PRN
  Administered 2016-02-11: 10 mg via INTRAMUSCULAR

## 2016-02-11 MED ORDER — BACLOFEN 10 MG PO TABS: 10.0000 mg | ORAL_TABLET | Freq: Three times a day (TID) | ORAL | 2 refills | Status: DC

## 2016-02-11 MED ORDER — ROPINIROLE HCL 0.5 MG PO TABS: 0.5000 mg | ORAL_TABLET | Freq: Every day | ORAL | 2 refills | Status: DC

## 2016-02-11 NOTE — Progress Notes (Deleted)
   Procedure Note  Patient: Christina Lewis             Date of Birth: 1955-12-26           MRN: [id number]             Visit Date: 02/11/2016  Procedures: Visit Diagnoses: Fibromyalgia  Other fatigue  Trapezius muscle spasm  Primary osteoarthritis of both hands  History of migraine  History of thyroid nodule  History of sleep apnea  Muscle weakness (generalized)  History of renal calculi  Trigger Point Inj Date/Time: 02/11/2016 2:17 PM Performed by: Tawni Pummel Authorized by: Tawni Pummel   Consent Given by:  Patient Site marked: the procedure site was marked   Timeout: prior to procedure the correct patient, procedure, and site was verified   Indications:  Muscle spasm and pain Total # of Trigger Points:  2 Location: neck   Needle Size:  27 G Approach:  Dorsal Medications #1:  0.5 mL lidocaine 1 %; 10 mg triamcinolone acetonide 40 MG/ML Medications #2:  0.5 mL lidocaine 1 %; 10 mg triamcinolone acetonide 40 MG/ML Patient tolerance:  Patient tolerated the procedure well with no immediate complications

## 2016-02-18 ENCOUNTER — Telehealth: Payer: Self-pay | Admitting: Rheumatology

## 2016-02-18 NOTE — Telephone Encounter (Signed)
Patient called about Baclofen rx. Patient states the rx is written for her to take 3x per day but was only given a supply of 30 pills. Patient would like to clarify the rx instructions and is wondering if she needs another rx sent to the pharmacy. Patient uses [facility] in [place].

## 2016-02-18 NOTE — Telephone Encounter (Signed)
Ok to send in 3 mo supply ?

## 2016-02-19 NOTE — Telephone Encounter (Signed)
New prescription baclofen 10 mg 1 by mouth up to 3 times a day when necessary dispense 90 pills with 2 refills or you can dispense three-month supply if that's what the patient prefers.    HPI ==> Patient is not doing well with Robaxin at this time. She is requesting [place] to change it to baclofen. I'm agreeable. New prescription baclofen 10 mg 1 by mouth up to 3 times a day when necessary dispense 90 pills with 2 refills Patient should not take evening dose of baclofen if she is taking Flexeril at night. Patient understands and is agreeable.

## 2016-02-20 ENCOUNTER — Ambulatory Visit
Admission: RE | Admit: 2016-02-20 | Discharge: 2016-02-20 | Disposition: A | Discharge status: Home / Self Care | Payer: BC Managed Care – PPO | Source: Ambulatory Visit | Attending: Internal Medicine | Admitting: Internal Medicine

## 2016-02-20 DIAGNOSIS — Z1231 Encounter for screening mammogram for malignant neoplasm of breast: Secondary | ICD-10-CM

## 2016-03-03 ENCOUNTER — Other Ambulatory Visit: Payer: Self-pay | Admitting: Rheumatology

## 2016-03-03 MED ORDER — BACLOFEN 10 MG PO TABS: 10.0000 mg | ORAL_TABLET | Freq: Three times a day (TID) | ORAL | 1 refills | Status: DC | PRN

## 2016-03-03 NOTE — Progress Notes (Signed)
Patient was given 30 pills instead of 30 day supply and I have decided that she would do best with a 90 day supply with a refill and I have changed the prescription to reflect this. Operative the prescription and handed to the patient (she is an office right now).

## 2016-03-23 ENCOUNTER — Telehealth: Payer: Self-pay | Admitting: Radiology

## 2016-03-23 NOTE — Telephone Encounter (Signed)
request received via fax for prior auth on the Diclofenac Gel   [id number] is the cover my meds code

## 2016-03-24 ENCOUNTER — Encounter: Payer: Self-pay | Admitting: Internal Medicine

## 2016-03-31 NOTE — Telephone Encounter (Signed)
Patient has not been given prescription for this since 2014 by our office.

## 2016-05-31 ENCOUNTER — Ambulatory Visit (INDEPENDENT_AMBULATORY_CARE_PROVIDER_SITE_OTHER): Payer: BC Managed Care – PPO | Admitting: Otolaryngology

## 2016-06-02 ENCOUNTER — Other Ambulatory Visit (HOSPITAL_COMMUNITY): Payer: Self-pay | Admitting: Endocrinology

## 2016-06-02 DIAGNOSIS — E049 Nontoxic goiter, unspecified: Secondary | ICD-10-CM

## 2016-07-02 ENCOUNTER — Other Ambulatory Visit: Payer: Self-pay | Admitting: Rheumatology

## 2016-07-02 NOTE — Telephone Encounter (Signed)
Last Visit: 02/11/16 Next Visit: 08/12/16  Okay to refill Baclofen, Tizanidine and Voltaren Gel?

## 2016-07-06 ENCOUNTER — Telehealth: Payer: Self-pay

## 2016-07-06 NOTE — Telephone Encounter (Signed)
Received a fax from [facility] regarding a prior authorization approval for Diclofenac Gel from 07/06/16 to 07/07/19.   Reference [id number] Phone number:[phone]  Will send document to scan center   Spoke to patient who voices understanding and denies any questions at this time  Abran Duke, CPhT  10:56 AM

## 2016-07-06 NOTE — Telephone Encounter (Signed)
Submitted a prior authorization request via fax to [facility]. Will update once we receive a response.   Fax: [phone]  Abran Duke, CPhT 10:12 AM

## 2016-07-28 ENCOUNTER — Telehealth: Payer: Self-pay | Admitting: Rheumatology

## 2016-07-28 NOTE — Telephone Encounter (Signed)
If patient thinks that she needs a hip injection to the right SI joint or the right greater trochanteric bursa, we can schedule her for hisFriday at 11:45 AM.If her pain is new and needs evaluation, she will have to be put on Dr. Corliss Skains scheduled at the next available. I will not be able to do any evaluation but I'm happy to give her the injection if that is all she needs.

## 2016-07-28 NOTE — Telephone Encounter (Signed)
Patient states she is having pain in her rihgt upper hip and down her thigh and to her knee. Patient states the pain is miserable. Patient states she is taking Aleve and using Volatren Gel, heat and ice. Patient is also using Baclofen and Zanaflex. Patient states she is also doing back exercises and rest. Patient is not getting any relief.  Patient is on Cymbalta. Please advise.

## 2016-07-28 NOTE — Telephone Encounter (Signed)
Attempted to contact the patient and left message for patient to call the office.  

## 2016-07-28 NOTE — Telephone Encounter (Signed)
Patient having a lot of increased pain with rt hip, down to knee x1 week. Pain level 8 or 9. Patient questions work in appt. Please call to advise.

## 2016-07-29 NOTE — Telephone Encounter (Signed)
Patient has been scheduled for an appointment on 07/30/16 @ 11:45

## 2016-07-30 ENCOUNTER — Ambulatory Visit (INDEPENDENT_AMBULATORY_CARE_PROVIDER_SITE_OTHER): Payer: BC Managed Care – PPO | Admitting: Rheumatology

## 2016-07-30 VITALS — BP 104/67

## 2016-07-30 DIAGNOSIS — M461 Sacroiliitis, not elsewhere classified: Secondary | ICD-10-CM

## 2016-07-30 DIAGNOSIS — M47818 Spondylosis without myelopathy or radiculopathy, sacral and sacrococcygeal region: Secondary | ICD-10-CM | POA: Diagnosis not present

## 2016-07-30 DIAGNOSIS — M7061 Trochanteric bursitis, right hip: Secondary | ICD-10-CM | POA: Diagnosis not present

## 2016-07-30 MED ORDER — LIDOCAINE HCL (PF) 1 % IJ SOLN
1.0000 mL | INTRAMUSCULAR | Status: AC | PRN
  Administered 2016-07-30: 1 mL

## 2016-07-30 MED ORDER — TRIAMCINOLONE ACETONIDE 40 MG/ML IJ SUSP
40.0000 mg | INTRAMUSCULAR | Status: AC | PRN
  Administered 2016-07-30: 40 mg via INTRA_ARTICULAR

## 2016-07-30 NOTE — Progress Notes (Signed)
  07/30/2016 at 11:54 AM 104/67

## 2016-07-30 NOTE — Progress Notes (Signed)
   Procedure Note  Patient: Christina Lewis             Date of Birth: 07/11/1955           MRN: [id number]             Visit Date: 07/30/2016  Procedures: Visit Diagnoses: Greater trochanteric bursitis of right hip  Osteoarthritis of sacroiliac region  Large Joint Inj Date/Time: 07/30/2016 12:35 PM Performed by: Tawni Pummel Authorized by: Tawni Pummel   Consent Given by:  Patient Site marked: the procedure site was marked   Timeout: prior to procedure the correct patient, procedure, and site was verified   Indications:  Pain Location:  Hip Site:  R greater trochanter Prep: patient was prepped and draped in usual sterile fashion   Needle Size:  27 G Approach:  Superior Ultrasound Guidance: No   Fluoroscopic Guidance: No   Arthrogram: No   Medications:  40 mg triamcinolone acetonide 40 MG/ML; 1 mL lidocaine (PF) 1 % Aspiration Attempted: Yes   Aspirate amount (mL):  0 Patient tolerance:  Patient tolerated the procedure well with no immediate complications  Right greater trochanter bursa pain.  Patient has been struggling with this pain for the last 2 weeks and it's gotten worse over the last 1 week.  40 mg of Kenalog mixed with 1 mL of 1% Xylocaine without epinephrine and without preservatives was injected into the right greater trochanteric bursa. Patient tolerated procedure well. Her pain was rated 7 on a scale of 0-10 prior to the injection. 5 minutes after the injection, her pain was down to a 4 on a scale of 0-10. She was pleased with the improvement. Large Joint Inj Date/Time: 07/30/2016 12:37 PM Performed by: Tawni Pummel Authorized by: Tawni Pummel   Consent Given by:  Patient Site marked: the procedure site was marked   Timeout: prior to procedure the correct patient, procedure, and site was verified   Indications:  Pain Location:  Sacroiliac Site:  R sacroiliac joint Prep: patient was prepped and draped in usual sterile fashion   Needle Size:   27 G Needle Length:  1.5 inches Approach:  Superior Ultrasound Guidance: No   Fluoroscopic Guidance: No   Arthrogram: No   Medications:  40 mg triamcinolone acetonide 40 MG/ML; 1 mL lidocaine (PF) 1 % Aspiration Attempted: Yes   Aspirate amount (mL):  0  PATIENT TOLERATED PROCEDURE WELL. THERE WERE NO COMPLICATIONS.  Right sacroiliac pain.  Patient has been struggling with this pain for the last 2 weeks and it's gotten worse over the last 1 week.  40 mg of Kenalog mixed with 1 mL of 1% Xylocaine without epinephrine and without preservatives was injected into the right sacroiliac joint. Patient tolerated procedure well. Her pain was rated 7 on a scale of 0-10 prior to the injection. 5 minutes after the injection, her pain was down to a 4 on a scale of 0-10. She was pleased with the improvement.

## 2016-08-12 ENCOUNTER — Ambulatory Visit (INDEPENDENT_AMBULATORY_CARE_PROVIDER_SITE_OTHER): Payer: BC Managed Care – PPO | Admitting: Rheumatology

## 2016-08-12 ENCOUNTER — Encounter: Payer: Self-pay | Admitting: Rheumatology

## 2016-08-12 VITALS — BP 118/74 | HR 74 | Resp 16 | Ht 64.0 in | Wt 160.0 lb

## 2016-08-12 DIAGNOSIS — R5383 Other fatigue: Secondary | ICD-10-CM | POA: Diagnosis not present

## 2016-08-12 DIAGNOSIS — M797 Fibromyalgia: Secondary | ICD-10-CM | POA: Diagnosis not present

## 2016-08-12 MED ORDER — TIZANIDINE HCL 4 MG PO TABS: 8.0000 mg | ORAL_TABLET | Freq: Every day | ORAL | 0 refills | Status: DC

## 2016-08-12 NOTE — Progress Notes (Signed)
Office Visit Note  Patient: Christina Lewis             Date of Birth: 03-Sep-1955           MRN: [id number]             PCP: Kirstie Peri, MD Referring: Kirstie Peri, MD Visit Date: 08/12/2016 Occupation: @GUAROCC @    Subjective:  Medication Management   History of Present Illness: Christina Lewis is a 61 y.o. female  Last seen 02/11/2016 for fibromyalgia.  Is here 07/30/2016 and got an injection in the right greater trochanteric bursa and right sacroiliac region injection. Patient reports that the injections helped tremendously. Patient is still benefiting from those injections and her pain has not reoccurred in those areas.    Activities of Daily Living:  Patient reports morning stiffness for 40--50 minutes.   Patient Reports nocturnal pain.  Difficulty dressing/grooming: Denies Difficulty climbing stairs: Denies Difficulty getting out of chair: Denies Difficulty using hands for taps, buttons, cutlery, and/or writing: Reports   Review of Systems  Constitutional: Positive for fatigue.  HENT: Negative for mouth sores and mouth dryness.   Eyes: Negative for dryness.  Respiratory: Negative for shortness of breath.   Gastrointestinal: Negative for constipation and diarrhea.  Musculoskeletal: Positive for myalgias and myalgias.  Skin: Negative for sensitivity to sunlight.  Psychiatric/Behavioral: Positive for sleep disturbance. Negative for decreased concentration.    PMFS History:  Patient Active Problem List   Diagnosis Date Noted  . Fibromyalgia 02/10/2016  . Other fatigue 02/10/2016  . Primary osteoarthritis of both hands 02/10/2016  . History of migraine 02/10/2016  . History of thyroid nodule 02/10/2016  . History of sleep apnea 02/10/2016  . History of renal calculi 02/10/2016  . Pain in joint, shoulder region 10/02/2010  . Adhesive capsulitis of shoulder 10/02/2010  . Muscle weakness (generalized) 10/02/2010    Past Medical History:  Diagnosis Date  .  Arthritis   . Diabetes mellitus   . Fibromyalgia   . Gout   . Migraines   . Osteoarthritis   . PONV (postoperative nausea and vomiting)   . Sleep apnea     History reviewed. No pertinent family history. Past Surgical History:  Procedure Laterality Date  . ABDOMINAL HYSTERECTOMY  2000  . APPENDECTOMY    . BREAST LUMPECTOMY  2008   lt-negative  . CHOLECYSTECTOMY  1985  . COLONOSCOPY    . KNEE ARTHROSCOPY Right 08/22/2013   Procedure: ARTHROSCOPY RIGHT KNEE FOR INFECTION LAVAGE AND DRAINAGE;  Surgeon: 10/23/2013., MD;  Location: [facility];  Service: Orthopedics;  Laterality: Right;  . KNEE ARTHROSCOPY WITH PATELLA RECONSTRUCTION Right 08/06/2013   Procedure: RIGHT KNEE ARTHROSCOPY WITH MENISCECTOMY MEDIAL, ARTHROSCOPY KNEE WITH DEBRIDEMENT/SHAVING (CHONDROPLASTY) ;  Surgeon: 08/08/2013., MD;  Location: [facility];  Service: Orthopedics;  Laterality: Right;  . NASAL SINUS SURGERY     x4  . OOPHORECTOMY    . SHOULDER SURGERY  2011   right shoulder  . TONSILLECTOMY     Social History   Social History Narrative  . No narrative on file     Objective: Vital Signs: BP 118/74   Pulse 74   Resp 16   Ht 5\' 4"  (1.626 m)   Wt 160 lb (72.6 kg)   BMI 27.46 kg/m    Physical Exam  Constitutional: She is oriented to person, place, and time. She appears well-developed and well-nourished.  HENT:  Head: Normocephalic and  atraumatic.  Eyes: EOM are normal. Pupils are equal, round, and reactive to light.  Cardiovascular: Normal rate, regular rhythm and normal heart sounds.  Exam reveals no gallop and no friction rub.   No murmur heard. Pulmonary/Chest: Effort normal and breath sounds normal. She has no wheezes. She has no rales.  Abdominal: Soft. Bowel sounds are normal. She exhibits no distension. There is no tenderness. There is no guarding. No hernia.  Musculoskeletal: Normal range of motion. She exhibits no edema, tenderness or deformity.    Lymphadenopathy:    She has no cervical adenopathy.  Neurological: She is alert and oriented to person, place, and time. Coordination normal.  Skin: Skin is warm and dry. Capillary refill takes less than 2 seconds. No rash noted.  Psychiatric: She has a normal mood and affect. Her behavior is normal.  Nursing note and vitals reviewed.    Musculoskeletal Exam:  Full range of motion of all joints Grip strength is equal and strong bilaterally Fiber myalgia tender points are 6 out of 18 positive (bilateral trapezius muscles, bilateral greater trochanteric bursa, bilateral SI joint)  CDAI Exam: CDAI Homunculus Exam:   Joint Counts:  CDAI Tender Joint count: 0 CDAI Swollen Joint count: 0  Global Assessments:  Patient Global Assessment: 5 Provider Global Assessment: 5  CDAI Calculated Score: 10   Investigation: No additional findings. No visits with results within 6 Month(s) from this visit.  Latest known visit with results is:  Admission on 08/22/2013, Discharged on 08/22/2013  Component Date Value Ref Range Status  . Glucose-Capillary 08/22/2013 94  70 - 99 mg/dL Final  . Hemoglobin 58/59/2924 13.3  12.0 - 15.0 g/dL Final  . Glucose-Capillary 08/22/2013 103* 70 - 99 mg/dL Final  . Specimen Description 08/22/2013 WOUND KNEE RIGHT   Final  . Special Requests 08/22/2013 PT ON DOXYCYCLINE   Final  . Gram Stain 08/22/2013    Final                   Value:NO WBC SEEN                         NO SQUAMOUS EPITHELIAL CELLS SEEN                         NO ORGANISMS SEEN                         Performed at [facility]  . Culture 08/22/2013    Final                   Value:NO ANAEROBES ISOLATED                         Performed at [facility]  . Report Status 08/22/2013 08/27/2013 FINAL   Final  . Specimen Description 08/22/2013 WOUND KNEE RIGHT   Final  . Special Requests 08/22/2013 PT ON DOXYCLINE   Final  . Gram Stain 08/22/2013    Final                    Value:NO WBC SEEN                         NO SQUAMOUS EPITHELIAL CELLS SEEN  NO ORGANISMS SEEN                         Performed at [facility]  . Culture 08/22/2013    Final                   Value:NO GROWTH 2 DAYS                         Performed at [facility]  . Report Status 08/22/2013 08/24/2013 FINAL   Final     Imaging: No results found.  Speciality Comments: No specialty comments available.    Procedures:  No procedures performed Allergies: Sulfa drugs cross reactors; Codeine; Erythromycin; Metronidazole; and Morphine and related   Assessment / Plan:     Visit Diagnoses: Fibromyalgia  Other fatigue - Plan: CBC with Differential/Platelet, COMPLETE METABOLIC PANEL WITH GFR   Plan: #1: Fibromyalgia syndrome. Doing well. Has pain she rates as 6 on a scale of 0-10 on most days. However it can flare as high as a 10 at some times.  #2: Fatigue. Ongoing.  #3: Insomnia. Patient gets very good relief with ties tizanidine. She has 4 mg pills; she uses 2 pills every night. Adequate response.  #4: Bilateral hand pain. Patient also complains of weakness. We will do physical therapy and I will write a prescription. I've given her handout on hand exercises.  #5: Patient gets Cymbalta from her psychiatrist Dr. Sharl Ma. That Cymbalta is helping her with psychiatry as well as with fibromyalgia.  #6: We do not have updated labs from patient and we will do CBC with differential and CMP with GFR in our office today. Patient is agreeable. Orders: Orders Placed This Encounter  Procedures  . CBC with Differential/Platelet  . COMPLETE METABOLIC PANEL WITH GFR   Meds ordered this encounter  Medications  . tiZANidine (ZANAFLEX) 4 MG tablet    Sig: Take 2 tablets (8 mg total) by mouth at bedtime.    Dispense:  180 tablet    Refill:  0    Order Specific Question:   Supervising Provider    Answer:   Pollyann Savoy [phone]    Face-to-face  time spent with patient was 30 minutes. 50% of time was spent in counseling and coordination of care.  Follow-Up Instructions: No Follow-up on file.   Tawni Pummel, PA-C  Note - This record has been created using [employer].  Chart creation errors have been sought, but may not always  have been located. Such creation errors do not reflect on  the standard of medical care.

## 2016-08-12 NOTE — Patient Instructions (Signed)

## 2016-08-13 LAB — COMPLETE METABOLIC PANEL WITH GFR
ALBUMIN: 4.3 g/dL (ref 3.6–5.1)
ALK PHOS: 71 U/L (ref 33–130)
ALT: 41 U/L — ABNORMAL HIGH (ref 6–29)
AST: 22 U/L (ref 10–35)
BILIRUBIN TOTAL: 0.2 mg/dL (ref 0.2–1.2)
BUN: 28 mg/dL — ABNORMAL HIGH (ref 7–25)
CALCIUM: 10 mg/dL (ref 8.6–10.4)
CO2: 20 mmol/L (ref 20–31)
CREATININE: 0.95 mg/dL (ref 0.50–1.05)
Chloride: 108 mmol/L (ref 98–110)
GFR, EST AFRICAN AMERICAN: 76 mL/min (ref >=60)
GFR, Est Non African American: 66 mL/min (ref >=60)
Glucose, Bld: 70 mg/dL (ref 65–99)
Potassium: 4.8 mmol/L (ref 3.5–5.3)
Sodium: 140 mmol/L (ref 135–146)
Total Protein: 6.9 g/dL (ref 6.1–8.1)

## 2016-08-13 LAB — CBC WITH DIFFERENTIAL/PLATELET
Basophils Absolute: 152 cells/uL (ref 0–200)
Basophils Relative: 1 %
EOS ABS: 304 {cells}/uL (ref 15–500)
Eosinophils Relative: 2 %
HEMATOCRIT: 47.3 % — AB (ref 35.0–45.0)
HEMOGLOBIN: 15.7 g/dL — AB (ref 11.7–15.5)
LYMPHS ABS: 3648 {cells}/uL (ref 850–3900)
LYMPHS PCT: 24 %
MCH: 31.3 pg (ref 27.0–33.0)
MCHC: 33.2 g/dL (ref 32.0–36.0)
MCV: 94.4 fL (ref 80.0–100.0)
MONO ABS: 1216 {cells}/uL — AB (ref 200–950)
MPV: 9.4 fL (ref 7.5–12.5)
Monocytes Relative: 8 %
Neutro Abs: 9880 cells/uL — ABNORMAL HIGH (ref 1500–7800)
Neutrophils Relative %: 65 %
Platelets: 402 10*3/uL — ABNORMAL HIGH (ref 140–400)
RBC: 5.01 MIL/uL (ref 3.80–5.10)
RDW: 13.9 % (ref 11.0–15.0)
WBC: 15.2 10*3/uL — AB (ref 3.8–10.8)

## 2016-08-18 ENCOUNTER — Other Ambulatory Visit: Payer: Self-pay | Admitting: *Deleted

## 2016-08-18 DIAGNOSIS — Z79899 Other long term (current) drug therapy: Secondary | ICD-10-CM

## 2016-08-30 ENCOUNTER — Ambulatory Visit (HOSPITAL_COMMUNITY): Payer: BC Managed Care – PPO

## 2016-09-01 ENCOUNTER — Ambulatory Visit (HOSPITAL_COMMUNITY)
Admission: RE | Admit: 2016-09-01 | Discharge: 2016-09-01 | Disposition: A | Discharge status: Home / Self Care | Payer: BC Managed Care – PPO | Source: Ambulatory Visit | Attending: Endocrinology | Admitting: Endocrinology

## 2016-09-01 DIAGNOSIS — E042 Nontoxic multinodular goiter: Secondary | ICD-10-CM | POA: Diagnosis not present

## 2016-09-01 DIAGNOSIS — E049 Nontoxic goiter, unspecified: Secondary | ICD-10-CM | POA: Diagnosis present

## 2016-09-08 ENCOUNTER — Ambulatory Visit (HOSPITAL_COMMUNITY): Payer: BC Managed Care – PPO | Admitting: Specialist

## 2016-09-08 ENCOUNTER — Telehealth (HOSPITAL_COMMUNITY): Payer: Self-pay | Admitting: Occupational Therapy

## 2016-09-08 ENCOUNTER — Telehealth (HOSPITAL_COMMUNITY): Payer: Self-pay | Admitting: Specialist

## 2016-09-08 NOTE — Telephone Encounter (Signed)
Pt cx on phone tree reminder call.

## 2016-09-08 NOTE — Telephone Encounter (Signed)
Requested return phone call due to no show for eval today.NF 09/08/16

## 2016-09-15 ENCOUNTER — Telehealth: Payer: Self-pay | Admitting: Pharmacist

## 2016-09-15 NOTE — Telephone Encounter (Signed)
I received notification from patient's insurance regarding possible drug interaction between ciprofloxacin and tizanidine.  The concomitant administration of these two medications increases the risk of hypotension, excessive drowsiness, and is contraindicated.  I called patient to discuss and to advised her to discontinue tizanidine while on ciprofloxacin.  Patient did not answer.  I left a voicemail asking her to call me back.   Lilla Shook, Pharm.D., BCPS, CPP Clinical Pharmacist Pager: [phone] Phone: [phone] 09/15/2016 3:32 PM

## 2016-09-15 NOTE — Telephone Encounter (Signed)
I spoke to patient regarding drug interaction between ciprofloxacin and tizanidine.  Patient confirms she is still taking ciprofloxacin and has a few days remaining on her antibiotic course.  Counseled patient on interaction between the two medications including the risk of hypotension and excessive drowsiness.  I advised patient to avoid taking tizanidine while she is on ciprofloxacin.  Patient reports she has been on ciprofloxacin many times while on tizanidine and has never had any side effects but she will stop it while she is on the antibiotic.   Lilla Shook, Pharm.D., BCPS, CPP Clinical Pharmacist Pager: [phone] Phone: [phone] 09/15/2016 5:16 PM

## 2016-10-28 ENCOUNTER — Ambulatory Visit (HOSPITAL_COMMUNITY): Payer: BC Managed Care – PPO

## 2016-11-01 ENCOUNTER — Ambulatory Visit (HOSPITAL_COMMUNITY): Payer: BC Managed Care – PPO

## 2016-11-04 ENCOUNTER — Encounter (HOSPITAL_COMMUNITY): Payer: Self-pay

## 2016-11-04 ENCOUNTER — Encounter (HOSPITAL_COMMUNITY): Payer: BC Managed Care – PPO | Attending: Oncology | Admitting: Oncology

## 2016-11-04 ENCOUNTER — Encounter (HOSPITAL_COMMUNITY): Payer: BC Managed Care – PPO

## 2016-11-04 VITALS — BP 106/72 | HR 95 | Temp 98.1°F | Resp 16 | Wt 164.3 lb

## 2016-11-04 DIAGNOSIS — D72820 Lymphocytosis (symptomatic): Secondary | ICD-10-CM

## 2016-11-04 DIAGNOSIS — D72829 Elevated white blood cell count, unspecified: Secondary | ICD-10-CM | POA: Insufficient documentation

## 2016-11-04 DIAGNOSIS — R1011 Right upper quadrant pain: Secondary | ICD-10-CM

## 2016-11-04 LAB — COMPREHENSIVE METABOLIC PANEL
ALT: 32 U/L (ref 14–54)
ANION GAP: 8 (ref 5–15)
AST: 24 U/L (ref 15–41)
Albumin: 4 g/dL (ref 3.5–5.0)
Alkaline Phosphatase: 59 U/L (ref 38–126)
BUN: 27 mg/dL — ABNORMAL HIGH (ref 6–20)
CO2: 23 mmol/L (ref 22–32)
CREATININE: 0.87 mg/dL (ref 0.44–1.00)
Calcium: 9.3 mg/dL (ref 8.9–10.3)
Chloride: 107 mmol/L (ref 101–111)
GFR calc non Af Amer: >60 mL/min (ref >60)
Glucose, Bld: 104 mg/dL — ABNORMAL HIGH (ref 65–99)
POTASSIUM: 3.9 mmol/L (ref 3.5–5.1)
SODIUM: 138 mmol/L (ref 135–145)
Total Bilirubin: 0.6 mg/dL (ref 0.3–1.2)
Total Protein: 6.5 g/dL (ref 6.5–8.1)

## 2016-11-04 LAB — CBC WITH DIFFERENTIAL/PLATELET
Basophils Absolute: 0.1 10*3/uL (ref 0.0–0.1)
Basophils Relative: 1 %
EOS ABS: 0.7 10*3/uL (ref 0.0–0.7)
Eosinophils Relative: 5 %
HCT: 39.1 % (ref 36.0–46.0)
Hemoglobin: 13.1 g/dL (ref 12.0–15.0)
LYMPHS ABS: 4.4 10*3/uL — AB (ref 0.7–4.0)
LYMPHS PCT: 30 %
MCH: 31.6 pg (ref 26.0–34.0)
MCHC: 33.5 g/dL (ref 30.0–36.0)
MCV: 94.4 fL (ref 78.0–100.0)
MONO ABS: 0.9 10*3/uL (ref 0.1–1.0)
Monocytes Relative: 6 %
NEUTROS ABS: 8.4 10*3/uL — AB (ref 1.7–7.7)
NEUTROS PCT: 58 %
PLATELETS: 348 10*3/uL (ref 150–400)
RBC: 4.14 MIL/uL (ref 3.87–5.11)
RDW: 13.8 % (ref 11.5–15.5)
WBC: 14.5 10*3/uL — AB (ref 4.0–10.5)

## 2016-11-04 NOTE — Progress Notes (Signed)
[facility] Initial Visit:  Patient Care Team: Kirstie Peri, MD as PCP - General (Internal Medicine)  CHIEF COMPLAINTS/PURPOSE OF CONSULTATION:  Chronic mild leukocytosis.  HISTORY OF PRESENTING ILLNESS: Christina Lewis 61 y.o. female is here because of chronic leukocytosis. Patient stated that her rheumatologist, who she sees for fibromyalgia and OA, told her 5 years ago that her WBC was persistently mildly elevated on her routine CBCs. Patient is a nonsmoker. Does not use chronic steroids. She denies any recurrent/chronic infections. Patient states that she is chronically fatigued but feels like it's been worse in the past 3 months. She feels that she has no energy to do any of her ADLs. She also states that she has chronic hot flashes and night sweats but they've been worse in the past few months. She also states that she's been having chronic right upper quadrant to right mid quadrant abdominal pain for the past few months is relieved by eating pad and sometimes exacerbated by food. She denies any weight loss, unexplained fevers. Denies any chest pain, shortness of breath, nausea, vomiting, diarrhea, constipation.  Her most recent CBC from 09/20/2016 demonstrated WBC 10.7 K, hemoglobin 13.8 g/dL, hematocrit 76.1%, platelet count 344K, absolute lymphocytes 3.8K, absolute eosinophil 0.6K, immature granulocytes 0.2K, otherwise differential was normal. Previous CBC from 08/12/2016 demonstrated WBC 15.2 K, hemoglobin 15.7 g/dL, hematocrit 95.0%, platelet count 402K, differential demonstrated absolute neutrophilia 9880, absolute lymphocytes 3648, absolute monocyte 1216.  Review of Systems - Oncology ROS as per HPI otherwise 12 point ROS is negative.  MEDICAL HISTORY: Past Medical History:  Diagnosis Date  . Arthritis   . Asthma 2007  . Diabetes mellitus   . Fibromyalgia   . Gout   . Hematuria   . History of tics   . Interstitial cystitis 2016  . Migraines   .  Osteoarthritis   . PCOS (polycystic ovarian syndrome) 1993  . PONV (postoperative nausea and vomiting)   . Sleep apnea   . 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 Thyroid nodule     SURGICAL HISTORY: Past Surgical History:  Procedure Laterality Date  . ABDOMINAL HYSTERECTOMY  2000  . APPENDECTOMY  1976  . BREAST LUMPECTOMY  1989   lt-negative  . CARDIOVASCULAR STRESS TEST  2000  . CHOLECYSTECTOMY  1985  . COLONOSCOPY    . HYSTERECTOMY ABDOMINAL WITH SALPINGECTOMY Bilateral 2000  . KIDNEY STONE SURGERY Right 2014   with stent placement in OR  . KNEE ARTHROSCOPY Right 08/22/2013   Procedure: ARTHROSCOPY RIGHT KNEE FOR INFECTION LAVAGE AND DRAINAGE;  Surgeon: Thera Flake., MD;  Location: [facility];  Service: Orthopedics;  Laterality: Right;  . KNEE ARTHROSCOPY WITH PATELLA RECONSTRUCTION Right 08/06/2013   Procedure: RIGHT KNEE ARTHROSCOPY WITH MENISCECTOMY MEDIAL, ARTHROSCOPY KNEE WITH DEBRIDEMENT/SHAVING (CHONDROPLASTY) ;  Surgeon: Thera Flake., MD;  Location: [facility];  Service: Orthopedics;  Laterality: Right;  . NASAL SINUS SURGERY     x4  . OOPHORECTOMY    . SHOULDER SURGERY  2011   right shoulder  . THYROID SURGERY  1994  . TONSILLECTOMY Bilateral 1974    SOCIAL HISTORY: Social History   Social History  . Marital status: Married    Spouse name: N/A  . Number of children: N/A  . Years of education: N/A   Occupational History  . Not on file.   Social History Main Topics  . Smoking status:  Never Smoker  . Smokeless tobacco: Never Used  . Alcohol use No  . Drug use: No  . Sexual activity: Yes   Other Topics Concern  . Not on file   Social History Narrative  . No narrative on file    FAMILY HISTORY History reviewed. No pertinent family history.  ALLERGIES:  is allergic to sulfa drugs cross reactors; codeine; erythromycin; metronidazole; and morphine and  related.  MEDICATIONS:  Current Outpatient Prescriptions  Medication Sig Dispense Refill  . baclofen (LIORESAL) 10 MG tablet TAKE 1 TABLET 3 TIMES DAILY AS NEEDED FOR MUSCLE SPASMS--AVOID THE EVENING DOSE OF BACLOFEN IF TAKING FLEXERIL AT NIGHT. 90 tablet 0  . butabarbital (BUTISOL) 50 MG TABS tablet Take 50 mg by mouth as needed.    . butalbital-acetaminophen-caffeine (FIORICET, ESGIC) 50-325-40 MG tablet     . Cholecalciferol (VITAMIN D) 2000 UNITS tablet Take 2,000 Units by mouth daily.    . diclofenac sodium (VOLTAREN) 1 % GEL APPLY 3 GM TO 3 LARGE JOINTS UP TO 3 TIMES A DAY. 500 g 0  . DULoxetine (CYMBALTA) 60 MG capsule Take 120 mg by mouth daily.      Marland Kitchen eletriptan (RELPAX) 20 MG tablet Take 20 mg by mouth as needed for migraine or headache. One tablet by mouth at onset of headache. May repeat in 2 hours if headache persists or recurs.    . nortriptyline (PAMELOR) 75 MG capsule Take 75 mg by mouth at bedtime.    . Omega-3 Fatty Acids (FISH OIL) 1000 MG CAPS Take 1,000 mg by mouth daily.    . risperiDONE (RISPERDAL) 0.25 MG tablet Take 0.25 mg by mouth daily.    Marland Kitchen spironolactone (ALDACTONE) 25 MG tablet     . tiZANidine (ZANAFLEX) 4 MG tablet Take 2 tablets (8 mg total) by mouth at bedtime. 180 tablet 0  . Topiramate (QUDEXY XR PO) 300 mg.      No current facility-administered medications for this visit.     PHYSICAL EXAMINATION:   Vitals:   11/04/16 1351  BP: 106/72  Pulse: 95  Resp: 16  Temp: 98.1 F (36.7 C)  SpO2: 99%    Filed Weights   11/04/16 1351  Weight: 164 lb 4.8 oz (74.5 kg)     Physical Exam Constitutional: Well-developed, well-nourished, and in no distress.   HENT:  Head: Normocephalic and atraumatic.  Mouth/Throat: No oropharyngeal exudate. Mucosa moist. Eyes: Pupils are equal, round, and reactive to light. Conjunctivae are normal. No scleral icterus.  Neck: Normal range of motion. Neck supple. No JVD present.  Cardiovascular: Normal rate, regular  rhythm and normal heart sounds.  Exam reveals no gallop and no friction rub.   No murmur heard. Pulmonary/Chest: Effort normal and breath sounds normal. No respiratory distress. No wheezes.No rales.  Abdominal: Soft. Bowel sounds are normal. No distension. There is no tenderness. There is no guarding.  Musculoskeletal: No edema or tenderness.  Lymphadenopathy:    No cervical or supraclavicular adenopathy.  Neurological: Alert and oriented to person, place, and time. No cranial nerve deficit.  Skin: Skin is warm and dry. No rash noted. No erythema. No pallor.  Psychiatric: Affect and judgment normal.   LABORATORY DATA: I have personally reviewed the data as listed:  Appointment on 11/04/2016  Component Date Value Ref Range Status  . WBC 11/04/2016 14.5* 4.0 - 10.5 K/uL Final  . RBC 11/04/2016 4.14  3.87 - 5.11 MIL/uL Final  . Hemoglobin 11/04/2016 13.1  12.0 - 15.0 g/dL Final  .  HCT 11/04/2016 39.1  36.0 - 46.0 % Final  . MCV 11/04/2016 94.4  78.0 - 100.0 fL Final  . MCH 11/04/2016 31.6  26.0 - 34.0 pg Final  . MCHC 11/04/2016 33.5  30.0 - 36.0 g/dL Final  . RDW 41/96/2229 13.8  11.5 - 15.5 % Final  . Platelets 11/04/2016 348  150 - 400 K/uL Final  . Neutrophils Relative % 11/04/2016 PENDING  % Incomplete  . Neutro Abs 11/04/2016 PENDING  1.7 - 7.7 K/uL Incomplete  . Band Neutrophils 11/04/2016 PENDING  % Incomplete  . Lymphocytes Relative 11/04/2016 PENDING  % Incomplete  . Lymphs Abs 11/04/2016 PENDING  0.7 - 4.0 K/uL Incomplete  . Monocytes Relative 11/04/2016 PENDING  % Incomplete  . Monocytes Absolute 11/04/2016 PENDING  0.1 - 1.0 K/uL Incomplete  . Eosinophils Relative 11/04/2016 PENDING  % Incomplete  . Eosinophils Absolute 11/04/2016 PENDING  0.0 - 0.7 K/uL Incomplete  . Basophils Relative 11/04/2016 PENDING  % Incomplete  . Basophils Absolute 11/04/2016 PENDING  0.0 - 0.1 K/uL Incomplete  . WBC Morphology 11/04/2016 PENDING   Incomplete  . RBC Morphology 11/04/2016  PENDING   Incomplete  . Smear Review 11/04/2016 PENDING   Incomplete  . nRBC 11/04/2016 PENDING  0 /100 WBC Incomplete  . Metamyelocytes Relative 11/04/2016 PENDING  % Incomplete  . Myelocytes 11/04/2016 PENDING  % Incomplete  . Promyelocytes Absolute 11/04/2016 PENDING  % Incomplete  . Blasts 11/04/2016 PENDING  % Incomplete    RADIOGRAPHIC STUDIES: I have personally reviewed the radiological images as listed and agree with the findings in the report  No results found.  ASSESSMENT/PLAN 61 y.o. female with a history of persistent mild leukocytosis for more than 6 months.  PLAN: -Will check a peripheral flow cytometry to rule out CLL.  -Check BCR-Abl to rule out CML. -Check a JAK2 mutation with reflex to rule out myeloproliferative disorders. -Repeat CBC CMP. -I have ordered an abdominal US to evaluate for her chronic RUQ pain. -RTC in 4 weeks to review leukocytosis workup.  Orders Placed This Encounter  Procedures  . US Abdomen Complete    S/w Amy, arrv , npo 6-8 hrs    Standing Status:   Future    Standing Expiration Date:   11/04/2017    Order Specific Question:   Reason for Exam (SYMPTOM  OR DIAGNOSIS REQUIRED)    Answer:   RUQ pain    Order Specific Question:   Preferred imaging location?    Answer:   [facility]  . CBC with Differential    Standing Status:   Future    Number of Occurrences:   1    Standing Expiration Date:   11/04/2017  . Comprehensive metabolic panel    Standing Status:   Future    Number of Occurrences:   1    Standing Expiration Date:   11/04/2017  . JAK2 V617F, w Reflex to CALR/E12/MPL    Standing Status:   Future    Number of Occurrences:   1    Standing Expiration Date:   11/04/2017  . BCR-ABL1, CML/ALL, PCR, QUANT    Standing Status:   Future    Number of Occurrences:   1    Standing Expiration Date:   11/04/2017  . Miscellaneous [facility] test (send-out)    Standing Status:   Future    Number of Occurrences:   1    Standing  Expiration Date:   11/04/2017    Order Specific Question:  Test name / description:    Answer:   Peripheral flow cytometry    All questions were answered. The patient knows to call the clinic with any problems, questions or concerns.  This note was electronically signed.    Ralene Cork, MD  11/04/2016 2:46 PM

## 2016-11-04 NOTE — Patient Instructions (Signed)
[facility] at [facility] Discharge Instructions  RECOMMENDATIONS MADE BY THE CONSULTANT AND ANY TEST RESULTS WILL BE SENT TO YOUR REFERRING PHYSICIAN.  You were seen today by Dr. Ralene Cork We will do lab work today We will schedule you for an abdominal ultrasound next week Follow up in 4 weeks    Thank you for choosing [facility] at [facility] to provide your oncology and hematology care.  To afford each patient quality time with our provider, please arrive at least 15 minutes before your scheduled appointment time.    If you have a lab appointment with the Cancer Center please come in thru the  Main Entrance and check in at the main information desk  You need to re-schedule your appointment should you arrive 10 or more minutes late.  We strive to give you quality time with our providers, and arriving late affects you and other patients whose appointments are after yours.  Also, if you no show three or more times for appointments you may be dismissed from the clinic at the providers discretion.     Again, thank you for choosing [facility].  Our hope is that these requests will decrease the amount of time that you wait before being seen by our physicians.       _____________________________________________________________  Should you have questions after your visit to [facility], please contact our office at [phone] between the hours of 8:30 a.m. and 4:30 p.m.  Voicemails left after 4:30 p.m. will not be returned until the following business day.  For prescription refill requests, have your pharmacy contact our office.       Resources For Cancer Patients and their Caregivers ? American Cancer Society: Can assist with transportation, wigs, general needs, runs Look Good Feel Better.        (706)818-8678 ? Cancer Care: Provides financial assistance, online support groups, medication/co-pay assistance.   1-800-813-HOPE 678-677-1181) ? [facility] Assists [place] cancer patients and their families through emotional , educational and financial support.  813-433-6434 ? [place] DSS Where to apply for food stamps, Medicaid and utility assistance. 343-482-8996 ? [employer]: Transportation to medical appointments. 504-458-2764 ? Social Security Administration: May apply for disability if have a Stage IV cancer. (972) 671-7502 867-055-3710 ? [employer], Disability and Transit Services: Assists with nutrition, care and transit needs. 930-181-8669  Cancer Center Support Programs: @10RELATIVEDAYS @ > Cancer Support Group  2nd Tuesday of the month 1pm-2pm, Journey Room  > Creative Journey  3rd Tuesday of the month 1130am-1pm, Journey Room  > Look Good Feel Better  1st Wednesday of the month 10am-12 noon, Journey Room (Call American Cancer Society to register [phone])

## 2016-11-10 ENCOUNTER — Ambulatory Visit (HOSPITAL_COMMUNITY)
Admission: RE | Admit: 2016-11-10 | Discharge: 2016-11-10 | Disposition: A | Discharge status: Home / Self Care | Payer: BC Managed Care – PPO | Source: Ambulatory Visit | Attending: Oncology | Admitting: Oncology

## 2016-11-10 DIAGNOSIS — R1011 Right upper quadrant pain: Secondary | ICD-10-CM

## 2016-11-10 DIAGNOSIS — Z9049 Acquired absence of other specified parts of digestive tract: Secondary | ICD-10-CM | POA: Diagnosis not present

## 2016-11-10 DIAGNOSIS — N1330 Unspecified hydronephrosis: Secondary | ICD-10-CM | POA: Insufficient documentation

## 2016-11-16 LAB — BCR-ABL1, CML/ALL, PCR, QUANT

## 2016-11-16 LAB — JAK2 V617F, W REFLEX TO CALR/E12/MPL

## 2016-11-30 ENCOUNTER — Other Ambulatory Visit: Payer: Self-pay | Admitting: Rheumatology

## 2016-12-01 NOTE — Telephone Encounter (Signed)
Last Visit: 08/12/16 Next Visit: 02/03/17  Okay to refill per Dr. Deveshwar 

## 2016-12-02 ENCOUNTER — Ambulatory Visit (HOSPITAL_COMMUNITY): Payer: BC Managed Care – PPO

## 2016-12-17 ENCOUNTER — Encounter (HOSPITAL_COMMUNITY): Payer: BC Managed Care – PPO | Attending: Oncology | Admitting: Oncology

## 2016-12-17 ENCOUNTER — Encounter (HOSPITAL_COMMUNITY): Payer: Self-pay | Admitting: Oncology

## 2016-12-17 VITALS — BP 126/75 | HR 100 | Temp 97.8°F | Resp 18 | Wt 171.7 lb

## 2016-12-17 DIAGNOSIS — N951 Menopausal and female climacteric states: Secondary | ICD-10-CM | POA: Diagnosis not present

## 2016-12-17 DIAGNOSIS — D72820 Lymphocytosis (symptomatic): Secondary | ICD-10-CM | POA: Insufficient documentation

## 2016-12-17 DIAGNOSIS — R1011 Right upper quadrant pain: Secondary | ICD-10-CM

## 2016-12-17 NOTE — Patient Instructions (Signed)
[facility] at [facility] Discharge Instructions  RECOMMENDATIONS MADE BY THE CONSULTANT AND ANY TEST RESULTS WILL BE SENT TO YOUR REFERRING PHYSICIAN.  You were seen by Dr. Doylene Canning today No follow up needed  Thank you for choosing [facility] at [facility] to provide your oncology and hematology care.  To afford each patient quality time with our provider, please arrive at least 15 minutes before your scheduled appointment time.    If you have a lab appointment with the Cancer Center please come in thru the  Main Entrance and check in at the main information desk  You need to re-schedule your appointment should you arrive 10 or more minutes late.  We strive to give you quality time with our providers, and arriving late affects you and other patients whose appointments are after yours.  Also, if you no show three or more times for appointments you may be dismissed from the clinic at the providers discretion.     Again, thank you for choosing [facility].  Our hope is that these requests will decrease the amount of time that you wait before being seen by our physicians.       _____________________________________________________________  Should you have questions after your visit to [facility], please contact our office at [phone] between the hours of 8:30 a.m. and 4:30 p.m.  Voicemails left after 4:30 p.m. will not be returned until the following business day.  For prescription refill requests, have your pharmacy contact our office.       Resources For Cancer Patients and their Caregivers ? American Cancer Society: Can assist with transportation, wigs, general needs, runs Look Good Feel Better.        [phone] ? Cancer Care: Provides financial assistance, online support groups, medication/co-pay assistance.  1-800-813-HOPE 617 220 0760) ? [facility] Assists [place] cancer  patients and their families through emotional , educational and financial support.  (646)477-2616 ? [place] DSS Where to apply for food stamps, Medicaid and utility assistance. (818) 304-4570 ? [employer]: Transportation to medical appointments. 239-592-5232 ? Social Security Administration: May apply for disability if have a Stage IV cancer. (985)382-6174 614 327 0823 ? [employer], Disability and Transit Services: Assists with nutrition, care and transit needs. 618-493-1268  Cancer Center Support Programs: @10RELATIVEDAYS @ > Cancer Support Group  2nd Tuesday of the month 1pm-2pm, Journey Room  > Creative Journey  3rd Tuesday of the month 1130am-1pm, Journey Room  > Look Good Feel Better  1st Wednesday of the month 10am-12 noon, Journey Room (Call American Cancer Society to register [phone])

## 2016-12-17 NOTE — Progress Notes (Signed)
[facility] Initial Visit:  Patient Care Team: Monico Blitz, MD as PCP - General (Internal Medicine)  CHIEF COMPLAINTS/PURPOSE OF CONSULTATION:  Chronic mild leukocytosis.  HISTORY OF PRESENTING ILLNESS: Christina Lewis 61 y.o. female is here because of chronic leukocytosis. Patient stated that her rheumatologist, who she sees for fibromyalgia and OA, told her 5 years ago that her WBC was persistently mildly elevated on her routine CBCs. Patient is a nonsmoker. Does not use chronic steroids. She denies any recurrent/chronic infections. Patient states that she is chronically fatigued but feels like it's been worse in the past 3 months. She feels that she has no energy to do any of her ADLs. She also states that she has chronic hot flashes and night sweats but they've been worse in the past few months. She also states that she's been having chronic right upper quadrant to right mid quadrant abdominal pain for the past few months is relieved by eating pad and sometimes exacerbated by food. She denies any weight loss, unexplained fevers. Denies any chest pain, shortness of breath, nausea, vomiting, diarrhea, constipation.  Her most recent CBC from 09/20/2016 demonstrated WBC 10.7 K, hemoglobin 13.8 g/dL, hematocrit 40.4%, platelet count 344K, absolute lymphocytes 3.8K, absolute eosinophil 0.6K, immature granulocytes 0.2K, otherwise differential was normal. Previous CBC from 08/12/2016 demonstrated WBC 15.2 K, hemoglobin 15.7 g/dL, hematocrit 47.3%, platelet count 402K, differential demonstrated absolute neutrophilia 9880, absolute lymphocytes 3648, absolute monocyte 1216. December 17, 2016  All lab data has been reviewed.  JAK-33mtation negative. .Marland Kitchencr  Abl   no mutation identifiedNo mutation identified   CLL profile was not sent.  But there is no increase in absolute Review of Systems - Oncology ROS as per HPI otherwise 12 point ROS is negative.  MEDICAL HISTORY: Past Medical  History:  Diagnosis Date  . Arthritis   . Asthma 2007  . Diabetes mellitus   . Fibromyalgia   . Gout   . Hematuria   . History of tics   . Interstitial cystitis 2016  . Migraines   . Osteoarthritis   . PCOS (polycystic ovarian syndrome) 1993  . PONV (postoperative nausea and vomiting)   . Sleep apnea   . 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 Thyroid nodule     SURGICAL HISTORY: Past Surgical History:  Procedure Laterality Date  . ABDOMINAL HYSTERECTOMY  2000  . APPENDECTOMY  1976  . BREAST LUMPECTOMY  1989   lt-negative  . CARDIOVASCULAR STRESS TEST  2000  . CHOLECYSTECTOMY  1985  . COLONOSCOPY    . HYSTERECTOMY ABDOMINAL WITH SALPINGECTOMY Bilateral 2000  . KIDNEY STONE SURGERY Right 2014   with stent placement in OR  . KNEE ARTHROSCOPY Right 08/22/2013   Procedure: ARTHROSCOPY RIGHT KNEE FOR INFECTION LAVAGE AND DRAINAGE;  Surgeon: WYvette Rack, MD;  [facility] [facility]  Service: Orthopedics;  Laterality: Right;  . KNEE ARTHROSCOPY WITH PATELLA RECONSTRUCTION Right 08/06/2013   Procedure: RIGHT KNEE ARTHROSCOPY WITH MENISCECTOMY MEDIAL, ARTHROSCOPY KNEE WITH DEBRIDEMENT/SHAVING (CHONDROPLASTY) ;  Surgeon: WYvette Rack, MD;  [facility] [facility]  Service: Orthopedics;  Laterality: Right;  . NASAL SINUS SURGERY     x4  . OOPHORECTOMY    . SHOULDER SURGERY  2011   right shoulder  . THYROID SURGERY  1994  . TONSILLECTOMY Bilateral 1974    SOCIAL HISTORY: Social History   Social History  . Marital status: Married  Spouse name: N/A  . Number of children: N/A  . Years of education: N/A   Occupational History  . Not on file.   Social History Main Topics  . Smoking status: Never Smoker  . Smokeless tobacco: Never Used  . Alcohol use No  . Drug use: No  . Sexual activity: Yes   Other Topics Concern  . Not on file   Social History Narrative  . No narrative on file     FAMILY HISTORY No family history on file.  ALLERGIES:  is allergic to sulfa drugs cross reactors; codeine; erythromycin; metronidazole; and morphine and related.  MEDICATIONS:  Current Outpatient Prescriptions  Medication Sig Dispense Refill  . baclofen (LIORESAL) 10 MG tablet TAKE 1 TABLET 3 TIMES DAILY AS NEEDED FOR MUSCLE SPASMS. AVOID EVENING DOSE OF BACLOFEN IF TAKING FLEXERIL AT NIGHT. 90 tablet 0  . butabarbital (BUTISOL) 50 MG TABS tablet Take 50 mg by mouth as needed.    . butalbital-acetaminophen-caffeine (FIORICET, ESGIC) 50-325-40 MG tablet     . Cholecalciferol (VITAMIN D) 2000 UNITS tablet Take 2,000 Units by mouth daily.    . diclofenac sodium (VOLTAREN) 1 % GEL APPLY 3 GM TO 3 LARGE JOINTS UP TO 3 TIMES A DAY. 500 g 0  . DULoxetine (CYMBALTA) 60 MG capsule Take 120 mg by mouth daily.      Marland Kitchen eletriptan (RELPAX) 20 MG tablet Take 20 mg by mouth as needed for migraine or headache. One tablet by mouth at onset of headache. May repeat in 2 hours if headache persists or recurs.    . nortriptyline (PAMELOR) 75 MG capsule Take 75 mg by mouth at bedtime.    . Omega-3 Fatty Acids (FISH OIL) 1000 MG CAPS Take 1,000 mg by mouth daily.    . risperiDONE (RISPERDAL) 0.25 MG tablet Take 0.25 mg by mouth daily.    Marland Kitchen spironolactone (ALDACTONE) 25 MG tablet     . tiZANidine (ZANAFLEX) 4 MG tablet TAKE 2 TABLETS BY MOUTH AT BEDTIME. 60 tablet 0  . Topiramate (QUDEXY XR PO) 300 mg.      No current facility-administered medications for this visit.     PHYSICAL EXAMINATION:   There were no vitals filed for this visit.  There were no vitals filed for this visit.   Physical Exam Constitutional: Well-developed, well-nourished, and in no distress.   HENT:  Head: Normocephalic and atraumatic.  Mouth/Throat: No oropharyngeal exudate. Mucosa moist. Eyes: Pupils are equal, round, and reactive to light. Conjunctivae are normal. No scleral icterus.  Neck: Normal range of motion. Neck  supple. No JVD present.  Cardiovascular: Normal rate, regular rhythm and normal heart sounds.  Exam reveals no gallop and no friction rub.   No murmur heard. Pulmonary/Chest: Effort normal and breath sounds normal. No respiratory distress. No wheezes.No rales.  Abdominal: Soft. Bowel sounds are normal. No distension. There is no tenderness. There is no guarding.  Musculoskeletal: No edema or tenderness.  Lymphadenopathy:    No cervical or supraclavicular adenopathy.  Neurological: Alert and oriented to person, place, and time. No cranial nerve deficit.  Skin: Skin is warm and dry. No rash noted. No erythema. No pallor.  Psychiatric: Affect and judgment normal.   LABORATORY DATA: I have personally reviewed the data as listed:  No visits with results within 1 Month(s) from this visit.  Latest known visit with results is:  Lab on 11/04/2016  Component Date Value Ref Range Status  . WBC 11/04/2016 14.5* 4.0 - 10.5 K/uL Final  .  RBC 11/04/2016 4.14  3.87 - 5.11 MIL/uL Final  . Hemoglobin 11/04/2016 13.1  12.0 - 15.0 g/dL Final  . HCT 11/04/2016 39.1  36.0 - 46.0 % Final  . MCV 11/04/2016 94.4  78.0 - 100.0 fL Final  . MCH 11/04/2016 31.6  26.0 - 34.0 pg Final  . MCHC 11/04/2016 33.5  30.0 - 36.0 g/dL Final  . RDW 11/04/2016 13.8  11.5 - 15.5 % Final  . Platelets 11/04/2016 348  150 - 400 K/uL Final  . Neutrophils Relative % 11/04/2016 58  % Final  . Lymphocytes Relative 11/04/2016 30  % Final  . Monocytes Relative 11/04/2016 6  % Final  . Eosinophils Relative 11/04/2016 5  % Final  . Basophils Relative 11/04/2016 1  % Final  . Neutro Abs 11/04/2016 8.4* 1.7 - 7.7 K/uL Final  . Lymphs Abs 11/04/2016 4.4* 0.7 - 4.0 K/uL Final  . Monocytes Absolute 11/04/2016 0.9  0.1 - 1.0 K/uL Final  . Eosinophils Absolute 11/04/2016 0.7  0.0 - 0.7 K/uL Final  . Basophils Absolute 11/04/2016 0.1  0.0 - 0.1 K/uL Final  . WBC Morphology 11/04/2016 MILD LEFT SHIFT (1-5% METAS, OCC MYELO, OCC BANDS)    Final  . Sodium 11/04/2016 138  135 - 145 mmol/L Final  . Potassium 11/04/2016 3.9  3.5 - 5.1 mmol/L Final  . Chloride 11/04/2016 107  101 - 111 mmol/L Final  . CO2 11/04/2016 23  22 - 32 mmol/L Final  . Glucose, Bld 11/04/2016 104* 65 - 99 mg/dL Final  . BUN 11/04/2016 27* 6 - 20 mg/dL Final  . Creatinine, Ser 11/04/2016 0.87  0.44 - 1.00 mg/dL Final  . Calcium 11/04/2016 9.3  8.9 - 10.3 mg/dL Final  . Total Protein 11/04/2016 6.5  6.5 - 8.1 g/dL Final  . Albumin 11/04/2016 4.0  3.5 - 5.0 g/dL Final  . AST 11/04/2016 24  15 - 41 U/L Final  . ALT 11/04/2016 32  14 - 54 U/L Final  . Alkaline Phosphatase 11/04/2016 59  38 - 126 U/L Final  . Total Bilirubin 11/04/2016 0.6  0.3 - 1.2 mg/dL Final  . GFR calc non Af Amer 11/04/2016 >60  >60 mL/min Final  . GFR calc Af Amer 11/04/2016 >60  >60 mL/min Final   Comment: (NOTE) The eGFR has been calculated using the CKD EPI equation. This calculation has not been validated in all clinical situations. eGFR's persistently <60 mL/min signify possible Chronic Kidney Disease.   . Anion gap 11/04/2016 8  5 - 15 Final  . JAK2 GenotypR 11/04/2016 Comment   Final   Comment: (NOTE) Result: NEGATIVE for the JAK2 V617F mutation. Interpretation:  The G to T nucleotide change encoding the V617F mutation was not detected.  This result does not rule out the presence of the JAK2 mutation at a level below the sensitivity of detection of this assay, or the presence of other mutations within JAK2 not detected by this assay.  This result does not rule out a diagnosis of polycythemia vera, essential thrombocythemia or idiopathic myelofibrosis as the V617F mutation is not detected in all patients with these disorders.   Marland Kitchen BACKGROUND: 11/04/2016 Comment   Final   Comment: (NOTE) JAK2 is a cytoplasmic tyrosine kinase with a key role in signal transduction from multiple hematopoietic growth factor receptors. A point mutation within exon 14 of the JAK2 gene  ([id number]) encoding a valine to phenylalanine substitution at position 617 of the JAK2 protein (V617F) has been identified in most  patients with polycythemia vera, and in about half of those with either essential thrombocythemia or idiopathic myelofibrosis. The V617F has also been detected, although infrequently, in other myeloid disorders such as chronic myelomonocytic leukemia and chronic neutrophilic luekemia. V617F is an acquired mutation that alters a highly conserved valine present in the negative regulatory JH2 domain of the JAK2 protein and is predicted to dysregulate kinase activity. Methodology: Total genomic DNA was extracted and subjected to TaqMan real-time PCR amplification/detection. Two amplification products per sample were monitored by real-time PCR using primers/probes s                          pecific to JAK2 wild type (WT) and JAK2 mutant V617F. The ABI7900 Absolute Quantitation software will compare the patient specimen valuse to the standard curves and generate percent values for wild type and mutant type. In vitro studies have indicated that this assay has an analytical sensitivity of 1%. References: Baxter EJ, Scott Phineas Real, et al. Acquired mutation of the tyrosine kinase JAK2 in human myeloproliferative disorders. Lancet. 2005 Mar 19-25; 365(9464):1054-1061. Alfonso Ramus Couedic JP. A unique clonal JAK2 mutation leading to constitutive signaling causes polycythaemia vera. Nature. 2005 Apr 28; 434(7037):1144-1148. Kralovics R, Passamonti F, Buser AS, et al. A gain-of-function mutation of JAK2 in myeloproliferative disorders. N Engl J Med. 2005 Apr 28; 352(17):1779-1790.   . Director Review, JAK2 11/04/2016 Comment   Final   Comment: (NOTE) Constance Goltz, PhD, [facility]               Director, [place] [facility] [place], [place]                [phone] This test was developed and its performance characteristics determined by [facility]. It has not been cleared or approved by the Food and Drug Administration.   Marland Kitchen REFLEX: 11/04/2016 Comment   Final   Comment: (NOTE) Reflex to CALR Mutation Analysis, JAK2 Exon 12-15 Mutation Analysis, and MPL Mutation Analysis is indicated.   . b2a2 transcript 11/04/2016 Comment  % Final   Comment: (NOTE)           <0.0032 % (sensitivity limit of assay)   . b3a2 transcript 11/04/2016 Comment  % Final   Comment: (NOTE)           <0.0032 % (sensitivity limit of assay)   . E1A2 Transcript 11/04/2016 Comment  % Final   Comment: (NOTE)           <0.0032 % (sensitivity limit of assay)   . Interpretation (BCRAL): 11/04/2016 Comment   Final   Comment: (NOTE) NEGATIVE for the BCR-ABL1 e1a2 (p190), e13a2 (b2a2, p210) and e14a2 (b3a2, p210) fusion transcripts. These results do not rule out the presence of rare BCR-ABL1 transcripts not detected by this assay.   . Director Review [facility]): 11/04/2016 Comment   Final   Comment: (NOTE) Constance Goltz, PhD, [facility]               Director, [place] for Molecular  Biology and Pathology               [place], [place]   . Background: 11/04/2016 Comment   Corrected   Comment: (NOTE) This assay can detect three different types of BCR-ABL1 fusion transcripts associated with CML, ALL, and AML: e13a2 (previously b2a2) and e14a2 (previously b3a2) (major breakpoint, p210), as well as e1a2 (minor breakpoint, p190). The e13a2 and e14a2 transcript values are titrated to the current International Scale (IS). The standardized baseline is 100% BCR-ABL1 (IS) and major molecular response (MMR) is equivalent to 0.1% BCR-ABL1 (IS) corresponding to a 3-log reduction. Results should be correlated with appropriate clinical and laboratory information as indicated.   . Methodology  11/04/2016 Comment   Corrected   Comment: (NOTE) Total RNA is isolated from the sample and subject to a real-time, reverse transcriptase polymerase chain reaction (RT-PCR). The PCR primers and probes are specific for BCR-ABL1 e13a2, e14a2 and e1a2 fusion transcripts. The ABL1 transcript is amplified as the control for cDNA quantity and quality. Serial dilutions of a validated positive control RNA with known t(9;22) BCR-ABL1 are used as reference for quantification of BCR-ABL1 relative to ABL1. The numeric BCR-ABL1 level is reportd as % BCR-ABL1/ABL1 and the detection sensitivity is 4.5 log below the standard baseline. This test was developed and its performance characteristics determined by [facility]. It has not been cleared or approved by the Food and Drug Administration. References:    1. Anastasia Fiedler and Branford S: Seminars in Hematology 2003;       40 (suppl2):62-68.    2. White HE, et al. Blood 2010; 116: e111-117.    3. NCCN Clinical Practice Guidelines in Oncology, Chronic       Myeloid Leukemia. V2. 2017.                           Performed At: [facility] [address] [place], [place] [id number] Nechama Guard MD TG:[id number] Performed At: [facility] [place], [place] [id number] Nechama Guard MD [id number]     RADIOGRAPHIC STUDIES: I have personally reviewed the radiological images as listed and agree with the findings in the report  No results found.  ASSESSMENT/PLAN 61 y.o. female with a history of persistent mild leukocytosis for more than 6 months.  PLAN: Ultrasound of more instructed bilateral hydronephrosis.  Left more than right.  Possibility of ureteral stone causing hydronephrosis cannot be ruled out patient has a previous history of bilateral nephrolithiasis  Possibility of blocked ureter might be causing some somewhat increasing blood count As there is no abnormality in mutation.  And no liver or spleen enlargement.   We will continue to observe this white blood count.  If it continues to go up then bone marrow aspiration biopsy may be needed No orders of the defined types were placed in this encounter.   All questions were answered. The patient knows to call the clinic with any problems, questions or concerns.  This note was electronically signed.    Forest Gleason, MD  12/17/2016 9:19 AM

## 2017-01-05 ENCOUNTER — Other Ambulatory Visit: Payer: Self-pay | Admitting: Rheumatology

## 2017-01-07 NOTE — Telephone Encounter (Signed)
Last Visit: 08/12/16 Next Visit: 02/03/17  Okay to refill per Dr. Corliss Skains

## 2017-01-20 ENCOUNTER — Other Ambulatory Visit: Payer: Self-pay | Admitting: Internal Medicine

## 2017-01-20 DIAGNOSIS — Z1231 Encounter for screening mammogram for malignant neoplasm of breast: Secondary | ICD-10-CM

## 2017-01-23 NOTE — Progress Notes (Deleted)
Office Visit Note  Patient: Christina Lewis             Date of Birth: 11-14-1955           MRN: [id number]             PCP: Kirstie Peri, MD Referring: Kirstie Peri, MD Visit Date: 02/03/2017 Occupation: @GUAROCC @    Subjective:  No chief complaint on file.   History of Present Illness: Christina Lewis is a 61 y.o. female ***   Activities of Daily Living:  Patient reports morning stiffness for *** {minute/hour:19697}.   Patient {ACTIONS;DENIES/REPORTS:21021675::"Denies"} nocturnal pain.  Difficulty dressing/grooming: {ACTIONS;DENIES/REPORTS:21021675::"Denies"} Difficulty climbing stairs: {ACTIONS;DENIES/REPORTS:21021675::"Denies"} Difficulty getting out of chair: {ACTIONS;DENIES/REPORTS:21021675::"Denies"} Difficulty using hands for taps, buttons, cutlery, and/or writing: {ACTIONS;DENIES/REPORTS:21021675::"Denies"}   No Rheumatology ROS completed.   PMFS History:  Patient Active Problem List   Diagnosis Date Noted  . Leukocytosis 11/04/2016  . Fibromyalgia 02/10/2016  . Other fatigue 02/10/2016  . Primary osteoarthritis of both hands 02/10/2016  . History of migraine 02/10/2016  . History of thyroid nodule 02/10/2016  . History of sleep apnea 02/10/2016  . History of renal calculi 02/10/2016  . Pain in joint, shoulder region 10/02/2010  . Adhesive capsulitis of shoulder 10/02/2010  . Muscle weakness (generalized) 10/02/2010    Past Medical History:  Diagnosis Date  . Arthritis   . Asthma 2007  . Diabetes mellitus   . Fibromyalgia   . Gout   . Hematuria   . History of tics   . Interstitial cystitis 2016  . Migraines   . Osteoarthritis   . PCOS (polycystic ovarian syndrome) 1993  . PONV (postoperative nausea and vomiting)   . Sleep apnea   . 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 Thyroid nodule     No family history on file. Past Surgical History:  Procedure Laterality Date  . ABDOMINAL HYSTERECTOMY   2000  . APPENDECTOMY  1976  . BREAST LUMPECTOMY  1989   lt-negative  . CARDIOVASCULAR STRESS TEST  2000  . CHOLECYSTECTOMY  1985  . COLONOSCOPY    . HYSTERECTOMY ABDOMINAL WITH SALPINGECTOMY Bilateral 2000  . KIDNEY STONE SURGERY Right 2014   with stent placement in OR  . KNEE ARTHROSCOPY Right 08/22/2013   Procedure: ARTHROSCOPY RIGHT KNEE FOR INFECTION LAVAGE AND DRAINAGE;  Surgeon: 10/23/2013., MD;  Location: [facility];  Service: Orthopedics;  Laterality: Right;  . KNEE ARTHROSCOPY WITH PATELLA RECONSTRUCTION Right 08/06/2013   Procedure: RIGHT KNEE ARTHROSCOPY WITH MENISCECTOMY MEDIAL, ARTHROSCOPY KNEE WITH DEBRIDEMENT/SHAVING (CHONDROPLASTY) ;  Surgeon: 08/08/2013., MD;  Location: [facility];  Service: Orthopedics;  Laterality: Right;  . NASAL SINUS SURGERY     x4  . OOPHORECTOMY    . SHOULDER SURGERY  2011   right shoulder  . THYROID SURGERY  1994  . TONSILLECTOMY Bilateral 1974   Social History   Social History Narrative  . Not on file     Objective: Vital Signs: There were no vitals taken for this visit.   Physical Exam   Musculoskeletal Exam: ***  CDAI Exam: No CDAI exam completed.    Investigation: No additional findings. CBC Latest Ref Rng & Units 11/04/2016 08/12/2016 08/22/2013  WBC 4.0 - 10.5 K/uL 14.5(H) 15.2(H) -  Hemoglobin 12.0 - 15.0 g/dL 10/23/2013 15.7(H) 13.3  Hematocrit 36.0 - 46.0 % 39.1 47.3(H) -  Platelets 150 - 400 K/uL 348  402(H) -   CMP Latest Ref Rng & Units 11/04/2016 08/12/2016 08/02/2013  Glucose 65 - 99 mg/dL 725(D) 70 95  BUN 6 - 20 mg/dL 66(Y) 40(H) 22  Creatinine 0.44 - 1.00 mg/dL 4.74 2.59 5.63  Sodium 135 - 145 mmol/L 138 140 139  Potassium 3.5 - 5.1 mmol/L 3.9 4.8 4.5  Chloride 101 - 111 mmol/L 107 108 101  CO2 22 - 32 mmol/L 23 20 22   Calcium 8.9 - 10.3 mg/dL 9.3 9.9  Total Protein 6.5 - 8.1 g/dL 6.5 6.9 -  Total Bilirubin 0.3 - 1.2 mg/dL 0.6 0.2 -  Alkaline Phos 38 - 126 U/L 59 71 -  AST  15 - 41 U/L 24 22 -  ALT 14 - 54 U/L 32 41(H) -    Imaging: No results found.  Speciality Comments: No specialty comments available.    Procedures:  No procedures performed Allergies: Sulfa drugs cross reactors; Codeine; Erythromycin; Metronidazole; and Morphine and related   Assessment / Plan:     Visit Diagnoses: No diagnosis found.    Orders: No orders of the defined types were placed in this encounter.  No orders of the defined types were placed in this encounter.   Face-to-face time spent with patient was *** minutes. 50% of time was spent in counseling and coordination of care.  Follow-Up Instructions: No Follow-up on file.   87.5, CMA  Note - This record has been created using Ellen Henri.  Chart creation errors have been sought, but may not always  have been located. Such creation errors do not reflect on  the standard of medical care.

## 2017-01-31 ENCOUNTER — Other Ambulatory Visit: Payer: Self-pay | Admitting: Rheumatology

## 2017-02-02 ENCOUNTER — Other Ambulatory Visit: Payer: Self-pay | Admitting: Rheumatology

## 2017-02-02 NOTE — Telephone Encounter (Signed)
Last Visit: 08/12/16 Next Visit: 02/03/17  Okay to refill per Dr. Deveshwar 

## 2017-02-03 ENCOUNTER — Ambulatory Visit: Payer: BC Managed Care – PPO | Admitting: Rheumatology

## 2017-02-08 ENCOUNTER — Other Ambulatory Visit: Payer: Self-pay | Admitting: Rheumatology

## 2017-02-09 NOTE — Telephone Encounter (Signed)
Last Visit: 6//21/18 Next Visit: 07/09/17  Okay to refill per Dr. Corliss Skains

## 2017-02-16 ENCOUNTER — Ambulatory Visit: Payer: BC Managed Care – PPO | Admitting: Rheumatology

## 2017-02-21 ENCOUNTER — Ambulatory Visit: Payer: BC Managed Care – PPO

## 2017-02-22 DIAGNOSIS — J189 Pneumonia, unspecified organism: Secondary | ICD-10-CM

## 2017-02-22 HISTORY — DX: Pneumonia, unspecified organism: J18.9

## 2017-03-14 ENCOUNTER — Other Ambulatory Visit: Payer: Self-pay | Admitting: *Deleted

## 2017-03-14 ENCOUNTER — Telehealth: Payer: Self-pay | Admitting: Rheumatology

## 2017-03-14 MED ORDER — TIZANIDINE HCL 4 MG PO TABS: 8.0000 mg | ORAL_TABLET | Freq: Every day | ORAL | 0 refills | Status: DC

## 2017-03-14 NOTE — Telephone Encounter (Signed)
Refill request received via fax  Last Visit: 6//21/18 Next Visit: 07/09/17  Okay to refill per Dr. Corliss Skains

## 2017-03-14 NOTE — Telephone Encounter (Signed)
Patient called stating that she is still having pain in her right hip and knee.  She is currently taking the muscle relaxers which are not working.  Patient's CB# [phone]

## 2017-03-14 NOTE — Telephone Encounter (Signed)
Patient has been scheduled for an appointment on 03/16/17 at 10:15 am.

## 2017-03-15 NOTE — Progress Notes (Signed)
Office Visit Note  Patient: Christina Lewis             Date of Birth: [birth year]           MRN: [id number]             PCP: Kirstie Peri, MD Referring: Kirstie Peri, MD Visit Date: 03/16/2017 Occupation: @GUAROCC @    Subjective:  Other (right knee/hip pain )   History of Present Illness: Christina Lewis is a 62 y.o. female with history of fibromyalgia osteoarthritis and disc disease. According to patient she's had problems with right trochanteric bursitis in the past. She's also had discomfort in her right knee joint for some time. She states last week she fell and landed up on her back on a hard floor. She was having difficulty walking after that. She continues to have increase discomfort in her hip and her knee joint. She's having some generalized pain from fibromyalgia no other joints have been painful. She continues to have some discomfort in her lower back.  Activities of Daily Living:  Patient reports morning stiffness for 1 hour.   Patient Reports nocturnal pain.  Difficulty dressing/grooming: Reports Difficulty climbing stairs: Reports Difficulty getting out of chair: Reports Difficulty using hands for taps, buttons, cutlery, and/or writing: Denies   Review of Systems  Constitutional: Positive for fatigue. Negative for night sweats, weight gain, weight loss and weakness.  HENT: Positive for mouth dryness. Negative for mouth sores, trouble swallowing, trouble swallowing and nose dryness.   Eyes: Positive for dryness. Negative for pain, redness and visual disturbance.  Respiratory: Negative for cough, shortness of breath and difficulty breathing.   Cardiovascular: Negative for chest pain, palpitations, hypertension, irregular heartbeat and swelling in legs/feet.  Gastrointestinal: Negative for blood in stool, constipation and diarrhea.  Endocrine: Negative for increased urination.  Genitourinary: Negative for vaginal dryness.  Musculoskeletal: Positive for arthralgias,  joint pain and morning stiffness. Negative for joint swelling, myalgias, muscle weakness, muscle tenderness and myalgias.  Skin: Negative for color change, rash, hair loss, skin tightness, ulcers and sensitivity to sunlight.  Allergic/Immunologic: Negative for susceptible to infections.  Neurological: Negative for dizziness, memory loss and night sweats.  Hematological: Negative for swollen glands.  Psychiatric/Behavioral: Positive for depressed mood and sleep disturbance. The patient is not nervous/anxious.     PMFS History:  Patient Active Problem List   Diagnosis Date Noted  . Leukocytosis 11/04/2016  . Fibromyalgia 02/10/2016  . Other fatigue 02/10/2016  . Primary osteoarthritis of both hands 02/10/2016  . History of migraine 02/10/2016  . History of thyroid nodule 02/10/2016  . History of sleep apnea 02/10/2016  . History of renal calculi 02/10/2016  . Pain in joint, shoulder region 10/02/2010  . Adhesive capsulitis of shoulder 10/02/2010  . Muscle weakness (generalized) 10/02/2010    Past Medical History:  Diagnosis Date  . Arthritis   . Asthma 2007  . Diabetes mellitus   . Fibromyalgia   . Gout   . Hematuria   . History of tics   . Interstitial cystitis 2016  . Migraines   . Osteoarthritis   . PCOS (polycystic ovarian syndrome) 1993  . PONV (postoperative nausea and vomiting)   . Sleep apnea   . 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 Thyroid nodule     Family History  Problem Relation Age of Onset  . Fibromyalgia Mother   . Psoriasis Mother  psoriatic arthritis   . Diabetes Mother   . Heart disease Mother   . Psoriasis Sister        psoriatic arthritis   . Diabetes Sister   . Rheum arthritis Sister   . Fibromyalgia Sister   . Diabetes Sister    Past Surgical History:  Procedure Laterality Date  . ABDOMINAL HYSTERECTOMY  2000  . APPENDECTOMY  1976  . BREAST LUMPECTOMY  1989   lt-negative  .  CARDIOVASCULAR STRESS TEST  2000  . CHOLECYSTECTOMY  1985  . COLONOSCOPY    . HYSTERECTOMY ABDOMINAL WITH SALPINGECTOMY Bilateral 2000  . KIDNEY STONE SURGERY Right 2014   with stent placement in OR  . KNEE ARTHROSCOPY Right 08/22/2013   Procedure: ARTHROSCOPY RIGHT KNEE FOR INFECTION LAVAGE AND DRAINAGE;  Surgeon: Thera Flake., MD;  Location: [facility];  Service: Orthopedics;  Laterality: Right;  . KNEE ARTHROSCOPY WITH PATELLA RECONSTRUCTION Right 08/06/2013   Procedure: RIGHT KNEE ARTHROSCOPY WITH MENISCECTOMY MEDIAL, ARTHROSCOPY KNEE WITH DEBRIDEMENT/SHAVING (CHONDROPLASTY) ;  Surgeon: Thera Flake., MD;  Location: [facility];  Service: Orthopedics;  Laterality: Right;  . NASAL SINUS SURGERY     x4  . OOPHORECTOMY    . SHOULDER SURGERY  2011   right shoulder  . THYROID SURGERY  1994  . TONSILLECTOMY Bilateral 1974   Social History   Social History Narrative  . Not on file     Objective: Vital Signs: BP 126/81 (BP Location: Left Arm, Patient Position: Sitting, Cuff Size: Normal)   Pulse 95   Resp 16   Ht 5\' 4"  (1.626 m)   Wt 173 lb (78.5 kg)   BMI 29.70 kg/m    Physical Exam  Constitutional: She is oriented to person, place, and time. She appears well-developed and well-nourished.  HENT:  Head: Normocephalic and atraumatic.  Eyes: Conjunctivae and EOM are normal.  Neck: Normal range of motion.  Cardiovascular: Normal rate, regular rhythm, normal heart sounds and intact distal pulses.  Pulmonary/Chest: Effort normal and breath sounds normal.  Abdominal: Soft. Bowel sounds are normal.  Lymphadenopathy:    She has no cervical adenopathy.  Neurological: She is alert and oriented to person, place, and time.  Skin: Skin is warm and dry. Capillary refill takes less than 2 seconds.  Psychiatric: She has a normal mood and affect. Her behavior is normal.  Nursing note and vitals reviewed.    Musculoskeletal Exam: C-spine and thoracic  spine  good range of motion. She is some discomfort range of motion of her lumbar spine with limited range of motion. Shoulder joints elbow joints wrist joints are good range of motion with no synovitis. She has DIP PIP thickening in her hands consistent with osteoarthritis. She has good range of motion of her left hip joint. Right hip joint is painful with range of motion. She is tenderness over right trochanteric area. She is tenderness across her fever and also in her right knee joint. Her right knee joint was slightly warm to touch.  CDAI Exam: No CDAI exam completed.    Investigation: No additional findings. CBC Latest Ref Rng & Units 11/04/2016 08/12/2016 08/22/2013  WBC 4.0 - 10.5 K/uL 14.5(H) 15.2(H) -  Hemoglobin 12.0 - 15.0 g/dL 40.9 15.7(H) 13.3  Hematocrit 36.0 - 46.0 % 39.1 47.3(H) -  Platelets 150 - 400 K/uL 348 402(H) -   CMP Latest Ref Rng & Units 11/04/2016 08/12/2016 08/02/2013  Glucose 65 - 99 mg/dL 811(B) 70 95  BUN 6 - 20 mg/dL 19(J) 47(W) 22  Creatinine 0.44 - 1.00 mg/dL 2.95 6.21 3.08  Sodium 135 - 145 mmol/L 138 140 139  Potassium 3.5 - 5.1 mmol/L 3.9 4.8 4.5  Chloride 101 - 111 mmol/L 107 108 101  CO2 22 - 32 mmol/L 23 20 22   Calcium 8.9 - 10.3 mg/dL 9.3 9.9  Total Protein 6.5 - 8.1 g/dL 6.5 6.9 -  Total Bilirubin 0.3 - 1.2 mg/dL 0.6 0.2 -  Alkaline Phos 38 - 126 U/L 59 71 -  AST 15 - 41 U/L 24 22 -  ALT 14 - 54 U/L 32 41(H) -    Imaging: Xr Femur, Min 2 Views Right  Result Date: 03/16/2017 Unremarkable x-ray of the femur no cortical defects or fracture was noted.  Xr Hip Unilat W Or W/o Pelvis 2-3 Views Right  Result Date: 03/16/2017 Mild superior-lateral narrowing was noted. No spurring was noted. No chondrocalcinosis was noted. Impression: These findings are consistent with mild osteoarthritis of the hip.  Xr Knee 3 View Right  Result Date: 03/16/2017 Moderate medial compartment narrowing with medial osteophyte was noted. No chondrocalcinosis was noted.  Moderate patellofemoral narrowing was noted. Impression: These findings are consistent with moderate osteoarthritis and moderate chondromalacia patella.  Xr Pelvis 1-2 Views  Result Date: 03/16/2017 No fracture was noted. No SI joint narrowing was noted. Impression: Unremarkable x-ray of the pelvis.   Speciality Comments: No specialty comments available.    Procedures:  Large Joint Inj: R greater trochanter on 03/16/2017 11:14 AM Indications: pain Details: 27 G 1.5 in needle, lateral approach  Arthrogram: No  Medications: 1.5 mL lidocaine (PF) 1 %; 40 mg triamcinolone acetonide 40 MG/ML Aspirate: 0 mL Outcome: tolerated well, no immediate complications Procedure, treatment alternatives, risks and benefits explained, specific risks discussed. Consent was given by the patient. Immediately prior to procedure a time out was called to verify the correct patient, procedure, equipment, support staff and site/side marked as required. Patient was prepped and draped in the usual sterile fashion.     Allergies: Sulfa antibiotics; Sulfa drugs cross reactors; Codeine; Erythromycin; Metronidazole; and Morphine and related   Assessment / Plan:     Visit Diagnoses: Fibromyalgia: She continues to have generalized pain and discomfort from fibromyalgia.  Pain of right hip joint - Plan: XR Pelvis 1-2 Views, XR HIP UNILAT W OR W/O PELVIS 2-3 VIEWS RIGHT. The x-ray of the pelvis and the hip joint was unremarkable mild osteoarthritis of the right hip joint was noted.   Trochanteric bursitis, right: She had arm pain and tenderness over the trochanteric bursa. Different treatment options and their side effects were discussed. Patient declined physical therapy. After side effects were reviewed the right trochanter bursa was injected with cortisone as described above. His stretching exercises were demonstrated and discussed in the office today.  Chronic pain of right knee - Plan: XR KNEE 3 VIEW RIGHT, she  does have moderate osteoarthritis in her right knee joint with moderate chondromalacia patella. She's been having increased pain since her fall. We will observe for right now.  Pain in right leg since the fall. X-ray of the right femur today was unremarkable.  Primary osteoarthritis of both hands: Joint protection and muscle strengthening discussed.  DDD (degenerative disc disease), lumbar: Chronic pain  Other medical problems are listed as follows:  Other fatigue  History of depression  History of diabetes mellitus  History of gastroesophageal reflux (GERD)  History of migraine  History of sleep apnea  History of PCOS  History of renal calculi  History of thyroid nodule  History of positive PPD - treated 1963  History of hematuria  History of tics    Orders: Orders Placed This Encounter  Procedures  . XR Pelvis 1-2 Views  . XR KNEE 3 VIEW RIGHT  . XR HIP UNILAT W OR W/O PELVIS 2-3 VIEWS RIGHT  . XR FEMUR, MIN 2 VIEWS RIGHT   No orders of the defined types were placed in this encounter.   Face-to-face time spent with patient was 30 minutes. Greater than 50% of time was spent in counseling and coordination of care.  Follow-Up Instructions: Return in about 6 months (around 09/13/2017) for OA FMS.   Pollyann Savoy, MD  Note - This record has been created using Animal nutritionist.  Chart creation errors have been sought, but may not always  have been located. Such creation errors do not reflect on  the standard of medical care.

## 2017-03-16 ENCOUNTER — Encounter: Payer: Self-pay | Admitting: Rheumatology

## 2017-03-16 ENCOUNTER — Ambulatory Visit (INDEPENDENT_AMBULATORY_CARE_PROVIDER_SITE_OTHER): Payer: Self-pay

## 2017-03-16 ENCOUNTER — Ambulatory Visit: Payer: BC Managed Care – PPO | Admitting: Rheumatology

## 2017-03-16 VITALS — BP 126/81 | HR 95 | Resp 16 | Ht 64.0 in | Wt 173.0 lb

## 2017-03-16 DIAGNOSIS — R7611 Nonspecific reaction to tuberculin skin test without active tuberculosis: Secondary | ICD-10-CM

## 2017-03-16 DIAGNOSIS — M25561 Pain in right knee: Secondary | ICD-10-CM

## 2017-03-16 DIAGNOSIS — M25551 Pain in right hip: Secondary | ICD-10-CM | POA: Diagnosis not present

## 2017-03-16 DIAGNOSIS — Z8742 Personal history of other diseases of the female genital tract: Secondary | ICD-10-CM | POA: Diagnosis not present

## 2017-03-16 DIAGNOSIS — Z87442 Personal history of urinary calculi: Secondary | ICD-10-CM | POA: Diagnosis not present

## 2017-03-16 DIAGNOSIS — M797 Fibromyalgia: Secondary | ICD-10-CM | POA: Diagnosis not present

## 2017-03-16 DIAGNOSIS — Z8669 Personal history of other diseases of the nervous system and sense organs: Secondary | ICD-10-CM

## 2017-03-16 DIAGNOSIS — M5136 Other intervertebral disc degeneration, lumbar region: Secondary | ICD-10-CM | POA: Diagnosis not present

## 2017-03-16 DIAGNOSIS — G8929 Other chronic pain: Secondary | ICD-10-CM

## 2017-03-16 DIAGNOSIS — Z8639 Personal history of other endocrine, nutritional and metabolic disease: Secondary | ICD-10-CM

## 2017-03-16 DIAGNOSIS — M7061 Trochanteric bursitis, right hip: Secondary | ICD-10-CM | POA: Diagnosis not present

## 2017-03-16 DIAGNOSIS — M51369 Other intervertebral disc degeneration, lumbar region without mention of lumbar back pain or lower extremity pain: Secondary | ICD-10-CM

## 2017-03-16 DIAGNOSIS — Z8659 Personal history of other mental and behavioral disorders: Secondary | ICD-10-CM

## 2017-03-16 DIAGNOSIS — M79604 Pain in right leg: Secondary | ICD-10-CM

## 2017-03-16 DIAGNOSIS — Z8719 Personal history of other diseases of the digestive system: Secondary | ICD-10-CM

## 2017-03-16 DIAGNOSIS — R5383 Other fatigue: Secondary | ICD-10-CM | POA: Diagnosis not present

## 2017-03-16 DIAGNOSIS — M19041 Primary osteoarthritis, right hand: Secondary | ICD-10-CM | POA: Diagnosis not present

## 2017-03-16 DIAGNOSIS — Z9289 Personal history of other medical treatment: Secondary | ICD-10-CM

## 2017-03-16 DIAGNOSIS — M19042 Primary osteoarthritis, left hand: Secondary | ICD-10-CM

## 2017-03-16 DIAGNOSIS — Z87448 Personal history of other diseases of urinary system: Secondary | ICD-10-CM

## 2017-03-16 MED ORDER — TRIAMCINOLONE ACETONIDE 40 MG/ML IJ SUSP
40.0000 mg | INTRAMUSCULAR | Status: AC | PRN
  Administered 2017-03-16: 40 mg via INTRA_ARTICULAR

## 2017-03-16 MED ORDER — LIDOCAINE HCL (PF) 1 % IJ SOLN
1.5000 mL | INTRAMUSCULAR | Status: AC | PRN
  Administered 2017-03-16: 1.5 mL

## 2017-04-25 ENCOUNTER — Ambulatory Visit (INDEPENDENT_AMBULATORY_CARE_PROVIDER_SITE_OTHER): Payer: BC Managed Care – PPO | Admitting: Otolaryngology

## 2017-04-25 DIAGNOSIS — J029 Acute pharyngitis, unspecified: Secondary | ICD-10-CM | POA: Diagnosis not present

## 2017-04-25 DIAGNOSIS — R070 Pain in throat: Secondary | ICD-10-CM | POA: Diagnosis not present

## 2017-04-26 ENCOUNTER — Other Ambulatory Visit: Payer: Self-pay | Admitting: Rheumatology

## 2017-04-26 NOTE — Telephone Encounter (Signed)
Last Visit: 03/16/17 Next Visit: 09/14/17  Okay to refill per Dr. Deveshwar 

## 2017-05-09 ENCOUNTER — Ambulatory Visit (INDEPENDENT_AMBULATORY_CARE_PROVIDER_SITE_OTHER): Payer: BC Managed Care – PPO | Admitting: Otolaryngology

## 2017-05-09 DIAGNOSIS — R070 Pain in throat: Secondary | ICD-10-CM | POA: Diagnosis not present

## 2017-05-09 DIAGNOSIS — K219 Gastro-esophageal reflux disease without esophagitis: Secondary | ICD-10-CM

## 2017-06-04 ENCOUNTER — Other Ambulatory Visit: Payer: Self-pay | Admitting: Rheumatology

## 2017-06-06 NOTE — Telephone Encounter (Signed)
Last Visit: 03/16/17 Next Visit: 09/14/17  Okay to refill per Dr. Deveshwar 

## 2017-06-13 ENCOUNTER — Other Ambulatory Visit: Payer: Self-pay | Admitting: *Deleted

## 2017-06-13 MED ORDER — DICLOFENAC SODIUM 1 % TD GEL: TRANSDERMAL | 0 refills | Status: DC

## 2017-06-13 NOTE — Telephone Encounter (Signed)
Refill request received via fax  Last Visit: 03/16/17 Next Visit: 09/14/17   Okay to refill per Dr. Corliss Skains

## 2017-06-20 ENCOUNTER — Ambulatory Visit (INDEPENDENT_AMBULATORY_CARE_PROVIDER_SITE_OTHER): Payer: BC Managed Care – PPO | Admitting: Otolaryngology

## 2017-06-29 ENCOUNTER — Ambulatory Visit: Payer: BC Managed Care – PPO | Admitting: Rheumatology

## 2017-07-04 ENCOUNTER — Other Ambulatory Visit: Payer: Self-pay | Admitting: Rheumatology

## 2017-07-04 NOTE — Telephone Encounter (Signed)
Last Visit: 03/16/17 Next Visit: 09/14/17  Okay to refill per Dr. Deveshwar 

## 2017-08-04 ENCOUNTER — Other Ambulatory Visit: Payer: Self-pay | Admitting: Rheumatology

## 2017-08-04 NOTE — Telephone Encounter (Signed)
Last Visit: 03/16/17 Next Visit: 09/14/17  Okay to refill per Dr. Deveshwar 

## 2017-08-31 NOTE — Progress Notes (Signed)
Office Visit Note  Patient: Christina Lewis             Date of Birth: 12/02/1955           MRN: [id number]             PCP: Kirstie Peri, MD Referring: Kirstie Peri, MD Visit Date: 09/14/2017 Occupation: @GUAROCC @  Subjective:  Generalized pain and lower back pain   History of Present Illness: Christina Lewis is a 62 y.o. female with history of fibromyalgia osteoarthritis and degenerative disc disease.  She states for the last 6 months she has been having increased lower back pain.  She is having muscle spasms in her lower back.  She continues to have some right trochanteric bursa pain.  The pain radiates into her right knee.  She continues to have hand discomfort.  Describes the pain from fibromyalgia about 5-6 on the scale of 0-10.  Activities of Daily Living:  Patient reports morning stiffness for 1-2 hours.   Patient Reports nocturnal pain.  Difficulty dressing/grooming: Denies Difficulty climbing stairs: Reports Difficulty getting out of chair: Reports Difficulty using hands for taps, buttons, cutlery, and/or writing: Denies  Review of Systems  Constitutional: Positive for fatigue. Negative for night sweats, weight gain and weight loss.  HENT: Negative for mouth sores, trouble swallowing, trouble swallowing, mouth dryness and nose dryness.   Eyes: Negative for pain, redness, visual disturbance and dryness.  Respiratory: Negative for cough, shortness of breath and difficulty breathing.   Cardiovascular: Negative for chest pain, palpitations, hypertension, irregular heartbeat and swelling in legs/feet.  Gastrointestinal: Negative for blood in stool, constipation and diarrhea.  Endocrine: Negative for increased urination.  Genitourinary: Negative for vaginal dryness.  Musculoskeletal: Positive for arthralgias, joint pain, myalgias, morning stiffness and myalgias. Negative for joint swelling, muscle weakness and muscle tenderness.  Skin: Negative for color change, rash, hair  loss, skin tightness, ulcers and sensitivity to sunlight.  Allergic/Immunologic: Negative for susceptible to infections.  Neurological: Negative for dizziness, memory loss, night sweats and weakness.  Hematological: Negative for swollen glands.  Psychiatric/Behavioral: Positive for depressed mood. Negative for sleep disturbance. The patient is nervous/anxious.     PMFS History:  Patient Active Problem List   Diagnosis Date Noted  . Leukocytosis 11/04/2016  . Fibromyalgia 02/10/2016  . Other fatigue 02/10/2016  . Primary osteoarthritis of both hands 02/10/2016  . History of migraine 02/10/2016  . History of thyroid nodule 02/10/2016  . History of sleep apnea 02/10/2016  . History of renal calculi 02/10/2016  . Pain in joint, shoulder region 10/02/2010  . Adhesive capsulitis of shoulder 10/02/2010  . Muscle weakness (generalized) 10/02/2010    Past Medical History:  Diagnosis Date  . Arthritis   . Asthma 2007  . Diabetes mellitus   . Fibromyalgia   . Gout   . Hematuria   . History of tics   . Interstitial cystitis 2016  . Migraines   . Osteoarthritis   . PCOS (polycystic ovarian syndrome) 1993  . PONV (postoperative nausea and vomiting)   . Sleep apnea   . 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 Thyroid nodule     Family History  Problem Relation Age of Onset  . Fibromyalgia Mother   . Psoriasis Mother        psoriatic arthritis   . Diabetes Mother   . Heart disease Mother   . Psoriasis Sister  psoriatic arthritis   . Diabetes Sister   . Rheum arthritis Sister   . Fibromyalgia Sister   . Diabetes Sister    Past Surgical History:  Procedure Laterality Date  . ABDOMINAL HYSTERECTOMY  2000  . APPENDECTOMY  1976  . BREAST LUMPECTOMY  1989   lt-negative  . CARDIOVASCULAR STRESS TEST  2000  . CHOLECYSTECTOMY  1985  . COLONOSCOPY    . HYSTERECTOMY ABDOMINAL WITH SALPINGECTOMY Bilateral 2000  . KIDNEY STONE  SURGERY Right 2014   with stent placement in OR  . KNEE ARTHROSCOPY Right 08/22/2013   Procedure: ARTHROSCOPY RIGHT KNEE FOR INFECTION LAVAGE AND DRAINAGE;  Surgeon: Thera Flake., MD;  Location: [facility];  Service: Orthopedics;  Laterality: Right;  . KNEE ARTHROSCOPY WITH PATELLA RECONSTRUCTION Right 08/06/2013   Procedure: RIGHT KNEE ARTHROSCOPY WITH MENISCECTOMY MEDIAL, ARTHROSCOPY KNEE WITH DEBRIDEMENT/SHAVING (CHONDROPLASTY) ;  Surgeon: Thera Flake., MD;  Location: [facility];  Service: Orthopedics;  Laterality: Right;  . NASAL SINUS SURGERY     x4  . OOPHORECTOMY    . SHOULDER SURGERY  2011   right shoulder  . THYROID SURGERY  1994  . TONSILLECTOMY Bilateral 1974   Social History   Social History Narrative  . Not on file    Objective: Vital Signs: BP 130/83 (BP Location: Left Arm, Patient Position: Sitting, Cuff Size: Normal)   Pulse 94   Resp 15   Ht 5\' 4"  (1.626 m)   Wt 168 lb (76.2 kg)   BMI 28.84 kg/m    Physical Exam  Constitutional: She is oriented to person, place, and time. She appears well-developed and well-nourished.  HENT:  Head: Normocephalic and atraumatic.  Eyes: Conjunctivae and EOM are normal.  Neck: Normal range of motion.  Cardiovascular: Normal rate, regular rhythm, normal heart sounds and intact distal pulses.  Pulmonary/Chest: Effort normal and breath sounds normal.  Abdominal: Soft. Bowel sounds are normal.  Lymphadenopathy:    She has no cervical adenopathy.  Neurological: She is alert and oriented to person, place, and time.  Skin: Skin is warm and dry. Capillary refill takes less than 2 seconds.  Psychiatric: She has a normal mood and affect. Her behavior is normal.  Nursing note and vitals reviewed.    Musculoskeletal Exam: C-spine and thoracic spine were in good range of motion.  She has limited range of motion of her lumbar spine with discomfort.  She has tenderness over right SI joint and right  trochanteric bursa.  Shoulder joints elbow joints wrist joint MCPs PIPs been good range of motion.  Hip joints knee joints ankles MTPs PIPs with good range of motion.  She has some thickening of PIP and DIP joints in her hands.  She has generalized hyperalgesia and positive tender points.  CDAI Exam: No CDAI exam completed.   Investigation: No additional findings.  Imaging: No results found.  Recent Labs: Lab Results  Component Value Date   WBC 14.5 (H) 11/04/2016   HGB 13.1 11/04/2016   PLT 348 11/04/2016   NA 138 11/04/2016   K 3.9 11/04/2016   CL 107 11/04/2016   CO2 23 11/04/2016   GLUCOSE 104 (H) 11/04/2016   BUN 27 (H) 11/04/2016   CREATININE 0.87 11/04/2016   BILITOT 0.6 11/04/2016   ALKPHOS 59 11/04/2016   AST 24 11/04/2016   ALT 32 11/04/2016   PROT 6.5 11/04/2016   ALBUMIN 4.0 11/04/2016   CALCIUM 9.3 11/04/2016   GFRAA >60 11/04/2016  Speciality Comments: No specialty comments available.  Procedures:  Sacroiliac Joint Inj on 09/14/2017 11:49 AM Indications: pain Details: 27 G 1.5 in needle, posterior approach Medications: 1 mL lidocaine 1 %; 40 mg triamcinolone acetonide 40 MG/ML Aspirate: 0 mL Outcome: tolerated well, no immediate complications Procedure, treatment alternatives, risks and benefits explained, specific risks discussed. Consent was given by the patient. Immediately prior to procedure a time out was called to verify the correct patient, procedure, equipment, support staff and site/side marked as required. Patient was prepped and draped in the usual sterile fashion.     Allergies: Sulfa antibiotics; Sulfa drugs cross reactors; Codeine; Erythromycin; Metronidazole; and Morphine and related   Assessment / Plan:     Visit Diagnoses: Fibromyalgia-she has generalized pain discomfort and positive tender points.  Primary osteoarthritis of both hands-joint protection and muscle strengthening was discussed.  Chronic SI joint pain-she has been  having discomfort in her right SI joint.  After informed consent was obtained right SI joint was injected with cortisone as described below which she tolerated well.  Trochanteric bursitis of right hip-IT band exercises were emphasized.  I have given her a prescription for Voltaren gel refill.  Side effects were reviewed.  DDD (degenerative disc disease), lumbar-she continues to have some lower back pain.  She is seen Dr. Venetia Maxon in the past.  Other medical problems are listed as follows.  Other fatigue  History of tics  History of depression  History of hematuria  History of diabetes mellitus  History of migraine  History of positive PPD - treated 1963  History of renal calculi  History of thyroid nodule  History of sleep apnea  History of PCOS  History of gastroesophageal reflux (GERD)   Orders: Orders Placed This Encounter  Procedures  . Sacroiliac Joint Inj  . Sacroiliac Joint Inj   Meds ordered this encounter  Medications  . diclofenac sodium (VOLTAREN) 1 % GEL    Sig: APPLY 3 GM TO 3 LARGE JOINTS UP TO 3 TIMES A DAY.    Dispense:  500 g    Refill:  0    Face-to-face time spent with patient was 30 minutes. Greater than 50% of time was spent in counseling and coordination of care.  Follow-Up Instructions: Return in about 6 months (around 03/17/2018) for FMS OA.   Pollyann Savoy, MD  Note - This record has been created using Animal nutritionist.  Chart creation errors have been sought, but may not always  have been located. Such creation errors do not reflect on  the standard of medical care.

## 2017-09-03 ENCOUNTER — Other Ambulatory Visit: Payer: Self-pay | Admitting: Rheumatology

## 2017-09-05 NOTE — Telephone Encounter (Signed)
Last Visit: 03/16/17 Next Visit: 09/14/17  Okay to refill per Dr. Deveshwar 

## 2017-09-14 ENCOUNTER — Ambulatory Visit: Payer: BC Managed Care – PPO | Admitting: Rheumatology

## 2017-09-14 ENCOUNTER — Encounter: Payer: Self-pay | Admitting: Rheumatology

## 2017-09-14 VITALS — BP 130/83 | HR 94 | Resp 15 | Ht 64.0 in | Wt 168.0 lb

## 2017-09-14 DIAGNOSIS — R7611 Nonspecific reaction to tuberculin skin test without active tuberculosis: Secondary | ICD-10-CM

## 2017-09-14 DIAGNOSIS — Z8669 Personal history of other diseases of the nervous system and sense organs: Secondary | ICD-10-CM

## 2017-09-14 DIAGNOSIS — M7061 Trochanteric bursitis, right hip: Secondary | ICD-10-CM

## 2017-09-14 DIAGNOSIS — M19041 Primary osteoarthritis, right hand: Secondary | ICD-10-CM | POA: Diagnosis not present

## 2017-09-14 DIAGNOSIS — R5383 Other fatigue: Secondary | ICD-10-CM

## 2017-09-14 DIAGNOSIS — M51369 Other intervertebral disc degeneration, lumbar region without mention of lumbar back pain or lower extremity pain: Secondary | ICD-10-CM

## 2017-09-14 DIAGNOSIS — M19042 Primary osteoarthritis, left hand: Secondary | ICD-10-CM

## 2017-09-14 DIAGNOSIS — Z8719 Personal history of other diseases of the digestive system: Secondary | ICD-10-CM

## 2017-09-14 DIAGNOSIS — G8929 Other chronic pain: Secondary | ICD-10-CM | POA: Diagnosis not present

## 2017-09-14 DIAGNOSIS — Z8639 Personal history of other endocrine, nutritional and metabolic disease: Secondary | ICD-10-CM

## 2017-09-14 DIAGNOSIS — Z9289 Personal history of other medical treatment: Secondary | ICD-10-CM

## 2017-09-14 DIAGNOSIS — Z8742 Personal history of other diseases of the female genital tract: Secondary | ICD-10-CM

## 2017-09-14 DIAGNOSIS — M5136 Other intervertebral disc degeneration, lumbar region: Secondary | ICD-10-CM

## 2017-09-14 DIAGNOSIS — M797 Fibromyalgia: Secondary | ICD-10-CM | POA: Diagnosis not present

## 2017-09-14 DIAGNOSIS — M533 Sacrococcygeal disorders, not elsewhere classified: Secondary | ICD-10-CM

## 2017-09-14 DIAGNOSIS — Z8659 Personal history of other mental and behavioral disorders: Secondary | ICD-10-CM

## 2017-09-14 DIAGNOSIS — Z87448 Personal history of other diseases of urinary system: Secondary | ICD-10-CM

## 2017-09-14 DIAGNOSIS — Z87442 Personal history of urinary calculi: Secondary | ICD-10-CM

## 2017-09-14 MED ORDER — LIDOCAINE HCL 1 % IJ SOLN
1.0000 mL | INTRAMUSCULAR | Status: AC | PRN
  Administered 2017-09-14: 1 mL

## 2017-09-14 MED ORDER — DICLOFENAC SODIUM 1 % TD GEL: TRANSDERMAL | 0 refills | Status: DC

## 2017-09-14 MED ORDER — TRIAMCINOLONE ACETONIDE 40 MG/ML IJ SUSP
40.0000 mg | INTRAMUSCULAR | Status: AC | PRN
  Administered 2017-09-14: 40 mg via INTRA_ARTICULAR

## 2017-10-04 ENCOUNTER — Other Ambulatory Visit: Payer: Self-pay | Admitting: Rheumatology

## 2017-10-04 NOTE — Telephone Encounter (Signed)
Last visit: 09/14/2017 Next visit: 03/16/2018  Okay to refill per Dr. Deveshwar. 

## 2017-10-13 ENCOUNTER — Other Ambulatory Visit: Payer: Self-pay | Admitting: Rheumatology

## 2017-10-13 NOTE — Telephone Encounter (Signed)
Last visit: 09/14/2017 Next visit: 03/16/2018  Okay to refill per Dr. Deveshwar. 

## 2017-10-19 ENCOUNTER — Other Ambulatory Visit: Payer: Self-pay | Admitting: Rheumatology

## 2017-10-19 NOTE — Telephone Encounter (Signed)
Last visit: 09/14/2017 Next visit: 03/16/2018  Okay to refill per Dr. Corliss Skains.

## 2017-11-04 ENCOUNTER — Other Ambulatory Visit: Payer: Self-pay | Admitting: Rheumatology

## 2017-11-04 NOTE — Telephone Encounter (Signed)
Last visit: 09/14/2017 Next visit: 03/16/2018  Okay to refill per Dr. Deveshwar. 

## 2017-11-21 ENCOUNTER — Other Ambulatory Visit: Payer: Self-pay | Admitting: Rheumatology

## 2017-11-22 NOTE — Telephone Encounter (Signed)
Last visit: 09/14/2017 Next visit: 03/16/2018  Okay to refill per Dr. Deveshwar. 

## 2017-12-02 ENCOUNTER — Other Ambulatory Visit: Payer: Self-pay | Admitting: Rheumatology

## 2017-12-02 NOTE — Telephone Encounter (Signed)
Last visit: 09/14/2017 Next visit: 03/16/2018  Okay to refill per Dr. Deveshwar. 

## 2017-12-14 ENCOUNTER — Other Ambulatory Visit: Payer: Self-pay | Admitting: Rheumatology

## 2017-12-15 NOTE — Telephone Encounter (Signed)
Last visit: 09/14/2017 Next visit: 03/16/2018  Okay to refill per Dr. Deveshwar. 

## 2018-01-01 ENCOUNTER — Other Ambulatory Visit: Payer: Self-pay | Admitting: Rheumatology

## 2018-01-02 NOTE — Telephone Encounter (Signed)
Last visit: 09/14/2017 Next visit: 03/16/2018  Okay to refill per Dr. Deveshwar. 

## 2018-01-24 ENCOUNTER — Other Ambulatory Visit: Payer: Self-pay | Admitting: Internal Medicine

## 2018-01-24 DIAGNOSIS — Z1231 Encounter for screening mammogram for malignant neoplasm of breast: Secondary | ICD-10-CM

## 2018-01-31 ENCOUNTER — Other Ambulatory Visit: Payer: Self-pay | Admitting: Rheumatology

## 2018-02-01 NOTE — Telephone Encounter (Signed)
Last visit: 09/14/2017 Next visit: 03/16/2018  Okay to refill per Dr. Deveshwar. 

## 2018-03-01 ENCOUNTER — Ambulatory Visit: Payer: BC Managed Care – PPO

## 2018-03-06 NOTE — Progress Notes (Signed)
Office Visit Note  Patient: Christina Lewis             Date of Birth: December 20, 1955           MRN: [id number]             PCP: Kirstie Peri, MD Referring: Kirstie Peri, MD Visit Date: 03/16/2018 Occupation: @GUAROCC @  Subjective:  Right knee joint pain   History of Present Illness: Christina Lewis is a 63 y.o. female with history of fibromyalgia, osteoarthritis, and DDD.  Patient takes baclofen 10 mg 3 times daily as needed for muscle spasms and Zanaflex 4 mg by mouth at bedtime to help with muscle spasms.  She uses Voltaren gel topically PRN for pain relief.  She is also on Cymbalta 120 mg by mouth daily.  Patient continues to have generalized muscle aches muscle tenderness due to fibromyalgia.  She states her fibromyalgia has been flaring more frequently and more severely.  She presents today with right knee pain that is progressively been getting worse since the end of December.  She denies any injuries.  She states that her right knee will often buckle.  She states that she has been having to wear a brace on a daily basis otherwise is difficult to bear weight due to the discomfort she has been experiencing.  She states that she has been having severe pain that wakes her up at night in the right knee joint.  She states that she has been taking Advil for pain relief and alternating with Excedrin Migraine.  She reports that she has been having worsening right trochanter bursitis due to how she has been favoring her right knee.  She denies any groin pain at this time.  She states that she has been having increased discomfort in bilateral wrist joints.  She states that she has right carpal tunnel and wears a brace PRN.  Activities of Daily Living:  Patient reports morning stiffness for1 hour.   Patient Reports nocturnal pain.  Difficulty dressing/grooming: Denies Difficulty climbing stairs: Reports Difficulty getting out of chair: Reports Difficulty using hands for taps, buttons, cutlery, and/or  writing: Reports  Review of Systems  Constitutional: Positive for fatigue.  HENT: Positive for mouth sores. Negative for mouth dryness and nose dryness.   Eyes: Positive for dryness. Negative for pain and visual disturbance.  Respiratory: Negative for cough, hemoptysis, shortness of breath and difficulty breathing.   Cardiovascular: Negative for chest pain, palpitations, hypertension and swelling in legs/feet.  Gastrointestinal: Positive for constipation. Negative for blood in stool and diarrhea.  Endocrine: Negative for increased urination.  Genitourinary: Negative for painful urination.  Musculoskeletal: Positive for arthralgias, joint pain, joint swelling and morning stiffness. Negative for myalgias, muscle weakness, muscle tenderness and myalgias.  Skin: Negative for color change, pallor, rash, hair loss, nodules/bumps, skin tightness, ulcers and sensitivity to sunlight.  Allergic/Immunologic: Negative for susceptible to infections.  Neurological: Positive for headaches (Hx of migraines). Negative for dizziness, numbness and weakness.  Hematological: Positive for swollen glands.  Psychiatric/Behavioral: Positive for depressed mood (Cymbalta 120 mg po daily). Negative for sleep disturbance. The patient is not nervous/anxious.     PMFS History:  Patient Active Problem List   Diagnosis Date Noted  . Leukocytosis 11/04/2016  . Fibromyalgia 02/10/2016  . Other fatigue 02/10/2016  . Primary osteoarthritis of both hands 02/10/2016  . History of migraine 02/10/2016  . History of thyroid nodule 02/10/2016  . History of sleep apnea 02/10/2016  . History of renal calculi  02/10/2016  . Pain in joint, shoulder region 10/02/2010  . Adhesive capsulitis of shoulder 10/02/2010  . Muscle weakness (generalized) 10/02/2010    Past Medical History:  Diagnosis Date  . Arthritis   . Asthma 2007  . Diabetes mellitus   . Fibromyalgia   . Gout   . Hematuria   . History of tics   . Interstitial  cystitis 2016  . Migraines   . Osteoarthritis   . PCOS (polycystic ovarian syndrome) 1993  . PONV (postoperative nausea and vomiting)   . Sleep apnea   . 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 Thyroid nodule     Family History  Problem Relation Age of Onset  . Fibromyalgia Mother   . Psoriasis Mother        psoriatic arthritis   . Diabetes Mother   . Heart disease Mother   . Psoriasis Sister        psoriatic arthritis   . Diabetes Sister   . Rheum arthritis Sister   . Fibromyalgia Sister   . Diabetes Sister    Past Surgical History:  Procedure Laterality Date  . ABDOMINAL HYSTERECTOMY  2000  . APPENDECTOMY  1976  . BREAST LUMPECTOMY  1989   lt-negative  . CARDIOVASCULAR STRESS TEST  2000  . CHOLECYSTECTOMY  1985  . COLONOSCOPY    . HYSTERECTOMY ABDOMINAL WITH SALPINGECTOMY Bilateral 2000  . KIDNEY STONE SURGERY Right 2014   with stent placement in OR  . KNEE ARTHROSCOPY Right 08/22/2013   Procedure: ARTHROSCOPY RIGHT KNEE FOR INFECTION LAVAGE AND DRAINAGE;  Surgeon: Thera Flake., MD;  Location: [facility];  Service: Orthopedics;  Laterality: Right;  . KNEE ARTHROSCOPY WITH PATELLA RECONSTRUCTION Right 08/06/2013   Procedure: RIGHT KNEE ARTHROSCOPY WITH MENISCECTOMY MEDIAL, ARTHROSCOPY KNEE WITH DEBRIDEMENT/SHAVING (CHONDROPLASTY) ;  Surgeon: Thera Flake., MD;  Location: [facility];  Service: Orthopedics;  Laterality: Right;  . NASAL SINUS SURGERY     x4  . OOPHORECTOMY    . SHOULDER SURGERY  2011   right shoulder  . THYROID SURGERY  1994  . TONSILLECTOMY Bilateral 1974   Social History   Social History Narrative  . Not on file    There is no immunization history on file for this patient.   Objective: Vital Signs: BP (!) 148/87 (BP Location: Left Arm, Patient Position: Sitting, Cuff Size: Normal)   Pulse 81   Resp 13   Ht  (1.626 m)   Wt 171 lb 9.6 oz (77.8 kg)   BMI 29.46  kg/m    Physical Exam Vitals signs and nursing note reviewed.  Constitutional:      Appearance: She is well-developed.  HENT:     Head: Normocephalic and atraumatic.  Eyes:     Conjunctiva/sclera: Conjunctivae normal.  Neck:     Musculoskeletal: Normal range of motion.  Cardiovascular:     Rate and Rhythm: Normal rate and regular rhythm.     Heart sounds: Normal heart sounds.  Pulmonary:     Effort: Pulmonary effort is normal.     Breath sounds: Normal breath sounds.  Abdominal:     General: Bowel sounds are normal.     Palpations: Abdomen is soft.  Lymphadenopathy:     Cervical: No cervical adenopathy.  Skin:    General: Skin is warm and dry.     Capillary Refill: Capillary refill takes less than 2 seconds.  Neurological:     Mental Status: She is alert and oriented to person, place, and time.  Psychiatric:        Behavior: Behavior normal.      Musculoskeletal Exam: C-spine, thoracic spine, and lumbar spine good ROM.  No midline spinal tenderness.  No SI joint tenderness. Shoulder joints, elbow joints, wrist joints, MCPs, PIPs, and DIPs good ROM with no synovitis.  Hip joints, knee joints, ankle joints, MTPs, PIPs, and DIPs good ROM with no synovitis.  Painful ROM of right knee joint.  No warmth or effusion of knee joints.  Tenderness over right trochanteric bursa.   CDAI Exam: CDAI Score: Not documented Patient Global Assessment: Not documented; Provider Global Assessment: Not documented Swollen: Not documented; Tender: Not documented Joint Exam   Not documented   There is currently no information documented on the homunculus. Go to the Rheumatology activity and complete the homunculus joint exam.  Investigation: No additional findings.  Imaging: Xr Knee 3 View Right  Result Date: 03/16/2018 Moderate medial compartment narrowing was noted.  No chondrocalcinosis was noted.  Moderate patellofemoral narrowing was noted. Impression: These findings are consistent  moderate osteoarthritis and moderate chondromalacia patella.  No radiographic progression was noted from the x-ray of January 2019.   Recent Labs: Lab Results  Component Value Date   WBC 14.5 (H) 11/04/2016   HGB 13.1 11/04/2016   PLT 348 11/04/2016   NA 138 11/04/2016   K 3.9 11/04/2016   CL 107 11/04/2016   CO2 23 11/04/2016   GLUCOSE 104 (H) 11/04/2016   BUN 27 (H) 11/04/2016   CREATININE 0.87 11/04/2016   BILITOT 0.6 11/04/2016   ALKPHOS 59 11/04/2016   AST 24 11/04/2016   ALT 32 11/04/2016   PROT 6.5 11/04/2016   ALBUMIN 4.0 11/04/2016   CALCIUM 9.3 11/04/2016   GFRAA >60 11/04/2016    Speciality Comments: No specialty comments available.  Procedures:  No procedures performed Allergies: Sulfa antibiotics; Sulfa drugs cross reactors; Codeine; Erythromycin; Metronidazole; and Morphine and related   Assessment / Plan:     Visit Diagnoses: Fibromyalgia -she continues have generalized muscle aches and muscle tenderness due to fibromyalgia.  She has been having more frequent and severe flares recently.  She has increased myalgias with colder weather.  She continues to take baclofen 10 mg 3 times daily as needed for muscle spasms and Zanaflex 4 mg by mouth at bedtime.  She has chronic fatigue that is related to insomnia.  She has interrupted sleep at night due to the discomfort she is been experiencing in her right knee joint.  An x-ray of the right knee was obtained today.  She was encouraged to try to stay active and exercise on a regular basis.  Good sleep hygiene was discussed.  She will follow-up in the office in 6 months.  Prescription refill for and Zanaflex was given.  Side effects were reviewed.  Primary osteoarthritis of both hands: She has PIP and DIP synovial thickening cyst with osteoarthritis of bilateral hands.  She has no synovitis on exam.  She has complete fist formation bilaterally.  She has intermittent discomfort in bilateral hands with colder weather.  Joint  protection and muscle strengthening were discussed.  Chronic pain of right knee -She has been having progressively worsening right knee pain since the end of December 2019.  No warmth or effusion was appreciated on exam today.  She has pain with flexion and extension on exam.  No erythema or ecchymosis was noted.  She denies any injuries.  She does experience an sensation of her right knee buckling.  She has been wearing a brace on a daily basis otherwise she is been having difficulty bearing weight.  An x-ray of the right knee was obtained today.  She is given a handout of knee exercises.  Plan: XR KNEE 3 VIEW RIGHT.  The x-ray showed moderate medial compartment narrowing and moderate chondromalacia patella.  We decided not to inject her as her blood pressure was elevated.  She has been advised to use Voltaren gel and also brace.  If her symptoms do not improve she can come in for a cortisone injection was a blood pressure is controlled.  Trochanteric bursitis of right hip: She has tenderness over the right trochanteric bursa on exam.  She was given a handout of exercises that she can perform at home.  Primary osteoarthritis of right knee: X-ray of the right knee is obtained on 03/16/2017 that revealed moderate osteoarthritis and moderate chondromalacia patella.  The patient has been having severe right knee pain for the past 1 month.  An x-ray of the right knee was obtained today.  Chronic SI joint pain: She has intermittent right SI joint pain.    DDD (degenerative disc disease), lumbar: She has good ROM with no discomfort at this time.   Other fatigue: Chronic and related to insomnia.   Other medical conditions are listed as follows:   History of gastroesophageal reflux (GERD)  History of tics  History of migraine  History of thyroid nodule  History of PCOS  History of diabetes mellitus  History of depression  History of renal calculi  History of sleep apnea  History of  hematuria  History of positive PPD - treated 1963   Orders: Orders Placed This Encounter  Procedures  . XR KNEE 3 VIEW RIGHT   Meds ordered this encounter  Medications  . baclofen (LIORESAL) 10 MG tablet    Sig: TAKE 1 TABLET 2 TIMES DAILY AS NEEDED FOR MUSCLE SPASMS--AVOID EVENING DOSE OF BACLOFEN IF TAKING TIZANIDINE AT NIGHT.    Dispense:  60 tablet    Refill:  0  . tiZANidine (ZANAFLEX) 4 MG tablet    Sig: Take 1 tablet (4 mg total) by mouth at bedtime.    Dispense:  30 tablet    Refill:  0  . diclofenac sodium (VOLTAREN) 1 % GEL    Sig: Apply 3 grams to 3 large joints, up to 3 times daily as needed.    Dispense:  3 Tube    Refill:  3    Face-to-face time spent with patient was 30 minutes. Greater than 50% of time was spent in counseling and coordination of care.  Follow-Up Instructions: Return in about 6 months (around 09/14/2018) for Fibromyalgia, Osteoarthritis.   Pollyann SavoyShaili Deveshwar, MD  Note - This record has been created using [employer]Dragon software.  Chart creation errors have been sought, but may not always  have been located. Such creation errors do not reflect on  the standard of medical care.

## 2018-03-16 ENCOUNTER — Encounter: Payer: Self-pay | Admitting: Rheumatology

## 2018-03-16 ENCOUNTER — Ambulatory Visit (INDEPENDENT_AMBULATORY_CARE_PROVIDER_SITE_OTHER): Payer: BC Managed Care – PPO

## 2018-03-16 ENCOUNTER — Ambulatory Visit: Payer: BC Managed Care – PPO | Admitting: Rheumatology

## 2018-03-16 VITALS — BP 148/87 | HR 81 | Resp 13 | Ht 64.0 in | Wt 171.6 lb

## 2018-03-16 DIAGNOSIS — M797 Fibromyalgia: Secondary | ICD-10-CM | POA: Diagnosis not present

## 2018-03-16 DIAGNOSIS — Z8639 Personal history of other endocrine, nutritional and metabolic disease: Secondary | ICD-10-CM

## 2018-03-16 DIAGNOSIS — Z8669 Personal history of other diseases of the nervous system and sense organs: Secondary | ICD-10-CM

## 2018-03-16 DIAGNOSIS — Z87442 Personal history of urinary calculi: Secondary | ICD-10-CM

## 2018-03-16 DIAGNOSIS — M25561 Pain in right knee: Secondary | ICD-10-CM

## 2018-03-16 DIAGNOSIS — M19042 Primary osteoarthritis, left hand: Secondary | ICD-10-CM

## 2018-03-16 DIAGNOSIS — Z87448 Personal history of other diseases of urinary system: Secondary | ICD-10-CM

## 2018-03-16 DIAGNOSIS — G8929 Other chronic pain: Secondary | ICD-10-CM

## 2018-03-16 DIAGNOSIS — M1711 Unilateral primary osteoarthritis, right knee: Secondary | ICD-10-CM

## 2018-03-16 DIAGNOSIS — Z9289 Personal history of other medical treatment: Secondary | ICD-10-CM

## 2018-03-16 DIAGNOSIS — M5136 Other intervertebral disc degeneration, lumbar region: Secondary | ICD-10-CM

## 2018-03-16 DIAGNOSIS — R5383 Other fatigue: Secondary | ICD-10-CM

## 2018-03-16 DIAGNOSIS — M19041 Primary osteoarthritis, right hand: Secondary | ICD-10-CM

## 2018-03-16 DIAGNOSIS — M7061 Trochanteric bursitis, right hip: Secondary | ICD-10-CM

## 2018-03-16 DIAGNOSIS — Z8719 Personal history of other diseases of the digestive system: Secondary | ICD-10-CM

## 2018-03-16 DIAGNOSIS — Z8742 Personal history of other diseases of the female genital tract: Secondary | ICD-10-CM

## 2018-03-16 DIAGNOSIS — M533 Sacrococcygeal disorders, not elsewhere classified: Secondary | ICD-10-CM

## 2018-03-16 DIAGNOSIS — Z8659 Personal history of other mental and behavioral disorders: Secondary | ICD-10-CM

## 2018-03-16 MED ORDER — BACLOFEN 10 MG PO TABS: ORAL_TABLET | ORAL | 0 refills | Status: DC

## 2018-03-16 MED ORDER — DICLOFENAC SODIUM 1 % TD GEL: TRANSDERMAL | 3 refills | Status: DC

## 2018-03-16 MED ORDER — TIZANIDINE HCL 4 MG PO TABS: 4.0000 mg | ORAL_TABLET | Freq: Every day | ORAL | 0 refills | Status: DC

## 2018-03-16 NOTE — Patient Instructions (Signed)
Trochanteric Bursitis Rehab Ask your health care provider which exercises are safe for you. Do exercises exactly as told by your health care provider and adjust them as directed. It is normal to feel mild stretching, pulling, tightness, or discomfort as you do these exercises, but you should stop right away if you feel sudden pain or your pain gets worse.Do not begin these exercises until told by your health care provider. Stretching exercises These exercises warm up your muscles and joints and improve the movement and flexibility of your hip. These exercises also help to relieve pain and stiffness. Exercise A: Iliotibial band stretch  1. Lie on your side with your left / right leg in the top position. 2. Bend your left / right knee and grab your ankle. 3. Slowly bring your knee back so your thigh is behind your body. 4. Slowly lower your knee toward the floor until you feel a gentle stretch on the outside of your left / right thigh. If you do not feel a stretch and your knee will not fall farther, place the heel of your other foot on top of your outer knee and pull your thigh down farther. 5. Hold this position for __________ seconds. 6. Slowly return to the starting position. Repeat __________ times. Complete this exercise __________ times a day. Strengthening exercises These exercises build strength and endurance in your hip and pelvis. Endurance is the ability to use your muscles for a long time, even after they get tired. Exercise B: Bridge (hip extensors)  1. Lie on your back on a firm surface with your knees bent and your feet flat on the floor. 2. Tighten your buttocks muscles and lift your buttocks off the floor until your trunk is level with your thighs. You should feel the muscles working in your buttocks and the back of your thighs. If this exercise is too easy, try doing it with your arms crossed over your chest. 3. Hold this position for __________ seconds. 4. Slowly return to the  starting position. 5. Let your muscles relax completely between repetitions. Repeat __________ times. Complete this exercise __________ times a day. Exercise C: Squats (knee extensors and  quadriceps) 1. Stand in front of a table, with your feet and knees pointing straight ahead. You may rest your hands on the table for balance but not for support. 2. Slowly bend your knees and lower your hips like you are going to sit in a chair. ? Keep your weight over your heels, not over your toes. ? Keep your lower legs upright so they are parallel with the table legs. ? Do not let your hips go lower than your knees. ? Do not bend lower than told by your health care provider. ? If your hip pain increases, do not bend as low. 3. Hold this position for __________ seconds. 4. Slowly push with your legs to return to standing. Do not use your hands to pull yourself to standing. Repeat __________ times. Complete this exercise __________ times a day. Exercise D: Hip hike 1. Stand sideways on a bottom step. Stand on your left / right leg with your other foot unsupported next to the step. You can hold onto the railing or wall if needed for balance. 2. Keeping your knees straight and your torso square, lift your left / right hip up toward the ceiling. 3. Hold this position for __________ seconds. 4. Slowly let your left / right hip lower toward the floor, past the starting position. Your foot should  get closer to the floor. Do not lean or bend your knees. Repeat __________ times. Complete this exercise __________ times a day. Exercise E: Single leg stand 1. Stand near a counter or door frame that you can hold onto for balance as needed. It is helpful to stand in front of a mirror for this exercise so you can watch your hip. 2. Squeeze your left / right buttock muscles then lift up your other foot. Do not let your left / right hip push out to the side. 3. Hold this position for __________ seconds. Repeat __________  times. Complete this exercise __________ times a day. This information is not intended to replace advice given to you by your health care provider. Make sure you discuss any questions you have with your health care provider. Document Released: 03/18/2004 Document Revised: 10/16/2015 Document Reviewed: 01/24/2015 Elsevier Interactive Patient Education  2019 [employer] Trochanteric Bursitis Trochanteric bursitis is a condition that causes hip pain. Trochanteric bursitis happens when fluid-filled sacs (bursae) in the hip get irritated. Normally these sacs absorb shock and help strong bands of tissue (tendons) in your hip glide smoothly over each other and over your hip bones. What are the causes? This condition results from increased friction between the hip bones and the tendons that go over them. This condition can happen if you:  Have weak hips.  Use your hip muscles too much (overuse).  Get hit in the hip. What increases the risk? This condition is more likely to develop in:  Women.  Adults who are middle-aged or older.  People with arthritis or a spinal condition.  People with weak buttocks muscles (gluteal muscles).  People who have one leg that is shorter than the other.  People who participate in certain kinds of athletic activities, such as: ? Running sports, especially long-distance running. ? Contact sports, like football or martial arts. ? Sports in which falls may occur, like skiing. What are the signs or symptoms? The main symptom of this condition is pain and tenderness over the point of your hip. The pain may be:  Sharp and intense.  Dull and achy.  Felt on the outside of your thigh. It may increase when you:  Lie on your side.  Walk or run.  Go up on stairs.  Sit.  Stand up after sitting.  Stand for long periods of time. How is this diagnosed? This condition may be diagnosed based on:  Your symptoms.  Your medical history.  A physical  exam.  Imaging tests, such as: ? X-rays to check your bones. ? An MRI or ultrasound to check your tendons and muscles. During your physical exam, your health care provider will check the movement and strength of your hip. He or she may press on the point of your hip to check for pain. How is this treated? This condition may be treated by:  Resting.  Reducing your activity.  Avoiding activities that cause pain.  Using crutches, a cane, or a walker to decrease the strain on your hip.  Taking medicine to help with swelling.  Having medicine injected into the bursae to help with swelling.  Using ice, heat, and massage therapy for pain relief.  Physical therapy exercises for strength and flexibility.  Surgery (rare). Follow these instructions at home: Activity  Rest.  Avoid activities that cause pain.  Return to your normal activities as told by your health care provider. Ask your health care provider what activities are safe for you. Managing pain, stiffness,  and swelling  Take over-the-counter and prescription medicines only as told by your health care provider.  If directed, apply heat to the injured area as told by your health care provider. ? Place a towel between your skin and the heat source. ? Leave the heat on for 20-30 minutes. ? Remove the heat if your skin turns bright red. This is especially important if you are unable to feel pain, heat, or cold. You may have a greater risk of getting burned.  If directed, apply ice to the injured area: ? Put ice in a plastic bag. ? Place a towel between your skin and the bag. ? Leave the ice on for 20 minutes, 2-3 times a day. General instructions  If the affected leg is one that you use for driving, ask your health care provider when it is safe to drive.  Use crutches, a cane, or a walker as told by your health care provider.  If one of your legs is shorter than the other, get fitted for a shoe insert.  Lose weight if  you are overweight. How is this prevented?  Wear supportive footwear that is appropriate for your sport.  If you have hip pain, start any new exercise or sport slowly.  Maintain physical fitness, including: ? Strength. ? Flexibility. Contact a health care provider if:  Your pain does not improve with 2-4 weeks. Get help right away if:  You develop severe pain.  You have a fever.  You develop increased redness over your hip.  You have a change in your bowel function or bladder function.  You cannot control the muscles in your feet. This information is not intended to replace advice given to you by your health care provider. Make sure you discuss any questions you have with your health care provider. Document Released: 03/18/2004 Document Revised: 10/15/2015 Document Reviewed: 01/24/2015 Elsevier Interactive Patient Education  2019 [employer] Knee Exercises              Ask your health care provider which exercises are safe for you. Do exercises exactly as told by your health care provider and adjust them as directed. It is normal to feel mild stretching, pulling, tightness, or discomfort as you do these exercises, but you should stop right away if you feel sudden pain or your pain gets worse.Do not begin these exercises until told by your health care provider. STRETCHING AND RANGE OF MOTION EXERCISES These exercises warm up your muscles and joints and improve the movement and flexibility of your knee. These exercises also help to relieve pain, numbness, and tingling. Exercise A: Knee Extension, Prone 1. Lie on your abdomen on a bed. 2. Place your left / right knee just beyond the edge of the surface so your knee is not on the bed. You can put a towel under your left / right thigh just above your knee for comfort. 3. Relax your leg muscles and allow gravity to straighten your knee. You should feel a stretch behind your left / right knee. 4. Hold this position for  __________ seconds. 5. Scoot up so your knee is supported between repetitions. Repeat __________ times. Complete this stretch __________ times a day. Exercise B: Knee Flexion, Active 1. Lie on your back with both knees straight. If this causes back discomfort, bend your left / right knee so your foot is flat on the floor. 2. Slowly slide your left / right heel back toward your buttocks until you feel a gentle stretch in  the front of your knee or thigh. 3. Hold this position for __________ seconds. 4. Slowly slide your left / right heel back to the starting position. Repeat __________ times. Complete this exercise __________ times a day. Exercise C: Quadriceps, Prone 1. Lie on your abdomen on a firm surface, such as a bed or padded floor. 2. Bend your left / right knee and hold your ankle. If you cannot reach your ankle or pant leg, loop a belt around your foot and grab the belt instead. 3. Gently pull your heel toward your buttocks. Your knee should not slide out to the side. You should feel a stretch in the front of your thigh and knee. 4. Hold this position for __________ seconds. Repeat __________ times. Complete this stretch __________ times a day. Exercise D: Hamstring, Supine 1. Lie on your back. 2. Loop a belt or towel over the ball of your left / right foot. The ball of your foot is on the walking surface, right under your toes. 3. Straighten your left / right knee and slowly pull on the belt to raise your leg until you feel a gentle stretch behind your knee. ? Do not let your left / right knee bend while you do this. ? Keep your other leg flat on the floor. 4. Hold this position for __________ seconds. Repeat __________ times. Complete this stretch __________ times a day. STRENGTHENING EXERCISES These exercises build strength and endurance in your knee. Endurance is the ability to use your muscles for a long time, even after they get tired. Exercise E: Quadriceps, Isometric 1. Lie  on your back with your left / right leg extended and your other knee bent. Put a rolled towel or small pillow under your knee if told by your health care provider. 2. Slowly tense the muscles in the front of your left / right thigh. You should see your kneecap slide up toward your hip or see increased dimpling just above the knee. This motion will push the back of the knee toward the floor. 3. For __________ seconds, keep the muscle as tight as you can without increasing your pain. 4. Relax the muscles slowly and completely. Repeat __________ times. Complete this exercise __________ times a day. Exercise F: Straight Leg Raises - Quadriceps 1. Lie on your back with your left / right leg extended and your other knee bent. 2. Tense the muscles in the front of your left / right thigh. You should see your kneecap slide up or see increased dimpling just above the knee. Your thigh may even shake a bit. 3. Keep these muscles tight as you raise your leg 4-6 inches (10-15 cm) off the floor. Do not let your knee bend. 4. Hold this position for __________ seconds. 5. Keep these muscles tense as you lower your leg. 6. Relax your muscles slowly and completely after each repetition. Repeat __________ times. Complete this exercise __________ times a day. Exercise G: Hamstring, Isometric 1. Lie on your back on a firm surface. 2. Bend your left / right knee approximately __________ degrees. 3. Dig your left / right heel into the surface as if you are trying to pull it toward your buttocks. Tighten the muscles in the back of your thighs to dig as hard as you can without increasing any pain. 4. Hold this position for __________ seconds. 5. Release the tension gradually and allow your muscles to relax completely for __________ seconds after each repetition. Repeat __________ times. Complete this exercise __________ times a  day. Exercise H: Hamstring Curls If told by your health care provider, do this exercise while  wearing ankle weights. Begin with __________ weights. Then increase the weight by 1 lb (0.5 kg) increments. Do not wear ankle weights that are more than __________. 1. Lie on your abdomen with your legs straight. 2. Bend your left / right knee as far as you can without feeling pain. Keep your hips flat against the floor. 3. Hold this position for __________ seconds. 4. Slowly lower your leg to the starting position. Repeat __________ times. Complete this exercise __________ times a day. Exercise I: Squats (Quadriceps) 1. Stand in front of a table, with your feet and knees pointing straight ahead. You may rest your hands on the table for balance but not for support. 2. Slowly bend your knees and lower your hips like you are going to sit in a chair. ? Keep your weight over your heels, not over your toes. ? Keep your lower legs upright so they are parallel with the table legs. ? Do not let your hips go lower than your knees. ? Do not bend lower than told by your health care provider. ? If your knee pain increases, do not bend as low. 3. Hold the squat position for __________ seconds. 4. Slowly push with your legs to return to standing. Do not use your hands to pull yourself to standing. Repeat __________ times. Complete this exercise __________ times a day. Exercise J: Wall Slides (Quadriceps) 1. Lean your back against a smooth wall or door while you walk your feet out 18-24 inches (46-61 cm) from it. 2. Place your feet hip-width apart. 3. Slowly slide down the wall or door until your knees bend __________ degrees. Keep your knees over your heels, not over your toes. Keep your knees in line with your hips. 4. Hold for __________ seconds. Repeat __________ times. Complete this exercise __________ times a day. Exercise K: Straight Leg Raises - Hip Abductors 1. Lie on your side with your left / right leg in the top position. Lie so your head, shoulder, knee, and hip line up. You may bend your  bottom knee to help you keep your balance. 2. Roll your hips slightly forward so your hips are stacked directly over each other and your left / right knee is facing forward. 3. Leading with your heel, lift your top leg 4-6 inches (10-15 cm). You should feel the muscles in your outer hip lifting. ? Do not let your foot drift forward. ? Do not let your knee roll toward the ceiling. 4. Hold this position for __________ seconds. 5. Slowly return your leg to the starting position. 6. Let your muscles relax completely after each repetition. Repeat __________ times. Complete this exercise __________ times a day. Exercise L: Straight Leg Raises - Hip Extensors 1. Lie on your abdomen on a firm surface. You can put a pillow under your hips if that is more comfortable. 2. Tense the muscles in your buttocks and lift your left / right leg about 4-6 inches (10-15 cm). Keep your knee straight as you lift your leg. 3. Hold this position for __________ seconds. 4. Slowly lower your leg to the starting position. 5. Let your leg relax completely after each repetition. Repeat __________ times. Complete this exercise __________ times a day. This information is not intended to replace advice given to you by your health care provider. Make sure you discuss any questions you have with your health care provider. Document Released: 12/23/2004  Document Revised: 11/03/2015 Document Reviewed: 12/15/2014 Elsevier Interactive Patient Education  [employer]2019 [employer]

## 2018-03-24 ENCOUNTER — Encounter: Payer: Self-pay | Admitting: Rheumatology

## 2018-03-24 NOTE — Telephone Encounter (Signed)
She has history of migraines.  Topamax can be prescribed by her neurologist or PCP.

## 2018-03-27 ENCOUNTER — Ambulatory Visit (HOSPITAL_COMMUNITY)
Admission: RE | Admit: 2018-03-27 | Discharge: 2018-03-27 | Disposition: A | Discharge status: Home / Self Care | Payer: BC Managed Care – PPO | Source: Ambulatory Visit | Attending: Internal Medicine | Admitting: Internal Medicine

## 2018-03-27 DIAGNOSIS — Z1231 Encounter for screening mammogram for malignant neoplasm of breast: Secondary | ICD-10-CM

## 2018-03-29 ENCOUNTER — Other Ambulatory Visit (HOSPITAL_COMMUNITY): Payer: Self-pay | Admitting: Internal Medicine

## 2018-03-29 ENCOUNTER — Other Ambulatory Visit (HOSPITAL_COMMUNITY): Payer: Self-pay | Admitting: Obstetrics and Gynecology

## 2018-04-03 ENCOUNTER — Other Ambulatory Visit (HOSPITAL_COMMUNITY): Payer: Self-pay | Admitting: Internal Medicine

## 2018-04-03 DIAGNOSIS — R928 Other abnormal and inconclusive findings on diagnostic imaging of breast: Secondary | ICD-10-CM

## 2018-04-13 ENCOUNTER — Other Ambulatory Visit: Payer: Self-pay | Admitting: Physician Assistant

## 2018-04-13 ENCOUNTER — Ambulatory Visit (HOSPITAL_COMMUNITY)
Admission: RE | Admit: 2018-04-13 | Discharge: 2018-04-13 | Disposition: A | Discharge status: Home / Self Care | Payer: BC Managed Care – PPO | Source: Ambulatory Visit | Attending: Internal Medicine | Admitting: Internal Medicine

## 2018-04-13 DIAGNOSIS — R928 Other abnormal and inconclusive findings on diagnostic imaging of breast: Secondary | ICD-10-CM | POA: Insufficient documentation

## 2018-04-13 NOTE — Telephone Encounter (Signed)
Last Visit:03/16/2018 Next Visit:09/12/2018  Okay to refillper Dr. Deveshwar. 

## 2018-05-11 ENCOUNTER — Other Ambulatory Visit: Payer: Self-pay | Admitting: Rheumatology

## 2018-05-12 NOTE — Telephone Encounter (Signed)
Last Visit:03/16/2018 Next Visit:09/12/2018  Okay to refillper Dr. Deveshwar. 

## 2018-06-05 ENCOUNTER — Other Ambulatory Visit (HOSPITAL_COMMUNITY): Payer: Self-pay | Admitting: Endocrinology

## 2018-06-05 ENCOUNTER — Other Ambulatory Visit: Payer: Self-pay | Admitting: Endocrinology

## 2018-06-05 DIAGNOSIS — E049 Nontoxic goiter, unspecified: Secondary | ICD-10-CM

## 2018-06-13 ENCOUNTER — Other Ambulatory Visit: Payer: Self-pay | Admitting: Rheumatology

## 2018-06-14 NOTE — Telephone Encounter (Signed)
Last Visit:03/16/2018 Next Visit:09/12/2018  Okay to refillper Dr. Deveshwar. 

## 2018-07-17 ENCOUNTER — Other Ambulatory Visit: Payer: Self-pay | Admitting: Rheumatology

## 2018-07-18 NOTE — Telephone Encounter (Signed)
Last Visit:03/16/2018 Next Visit:09/12/2018  Okay to refillper Dr. Deveshwar. 

## 2018-07-24 ENCOUNTER — Ambulatory Visit (HOSPITAL_COMMUNITY)
Admission: RE | Admit: 2018-07-24 | Discharge: 2018-07-24 | Disposition: A | Discharge status: Home / Self Care | Payer: BC Managed Care – PPO | Source: Ambulatory Visit | Attending: Endocrinology | Admitting: Endocrinology

## 2018-07-24 ENCOUNTER — Other Ambulatory Visit: Payer: Self-pay

## 2018-07-24 DIAGNOSIS — E049 Nontoxic goiter, unspecified: Secondary | ICD-10-CM | POA: Insufficient documentation

## 2018-08-17 ENCOUNTER — Other Ambulatory Visit (HOSPITAL_COMMUNITY): Payer: Self-pay | Admitting: Internal Medicine

## 2018-08-17 ENCOUNTER — Other Ambulatory Visit: Payer: Self-pay | Admitting: Rheumatology

## 2018-08-17 NOTE — Telephone Encounter (Signed)
Last Visit:03/16/2018 Next Visit:09/12/2018  Okay to refillper Dr. Estanislado Pandy.

## 2018-08-21 ENCOUNTER — Other Ambulatory Visit (HOSPITAL_COMMUNITY): Payer: Self-pay | Admitting: Internal Medicine

## 2018-08-21 DIAGNOSIS — N63 Unspecified lump in unspecified breast: Secondary | ICD-10-CM

## 2018-08-29 ENCOUNTER — Ambulatory Visit (HOSPITAL_COMMUNITY)
Admission: RE | Admit: 2018-08-29 | Discharge: 2018-08-29 | Disposition: A | Discharge status: Home / Self Care | Payer: Medicare Other | Source: Ambulatory Visit | Attending: Internal Medicine | Admitting: Internal Medicine

## 2018-08-29 ENCOUNTER — Ambulatory Visit (HOSPITAL_COMMUNITY): Payer: Medicare Other

## 2018-08-29 ENCOUNTER — Other Ambulatory Visit: Payer: Self-pay

## 2018-08-29 DIAGNOSIS — R922 Inconclusive mammogram: Secondary | ICD-10-CM | POA: Insufficient documentation

## 2018-08-29 DIAGNOSIS — N6320 Unspecified lump in the left breast, unspecified quadrant: Secondary | ICD-10-CM | POA: Insufficient documentation

## 2018-08-29 DIAGNOSIS — N63 Unspecified lump in unspecified breast: Secondary | ICD-10-CM | POA: Diagnosis present

## 2018-08-31 NOTE — Progress Notes (Signed)
Office Visit Note  Patient: Christina Lewis             Date of Birth: 1955/06/29           MRN: [id number]             PCP: Kirstie Peri, MD Referring: Kirstie Peri, MD Visit Date: 09/12/2018 Occupation: @GUAROCC @  Subjective:  Numbness and pain in both hands   History of Present Illness: Christina Lewis is a 63 y.o. female with history of fibromyalgia and osteoarthritis.  She takes baclofen 10 mg 1 tablet twice daily as needed for muscle spasms and Zanaflex 4 mg by mouth at bedtime as needed for muscle spasms.  She uses Voltaren gel topically as needed for pain relief.  She continues to take Cymbalta 120 mg by mouth daily.  She continues to have generalized muscle aches and muscle tenderness due to fibromyalgia.  She is having left trapezius muscle tension and muscle tenderness,.  She states that the baclofen and Zanaflex help with her muscle tension and muscle spasms.  She states that she has been having increased pain in bilateral hands for the past 1 month.  She is also noticed increased stiffness but denies any obvious joint swelling.  She is also been experiencing nocturnal numbness and tingling in both hands.  She has tried wearing a right carpal tunnel night splint without much relief.  She has started experiencing symptoms in her left hand as well.  She is also having right knee joint pain.  She states that her right knee swells intermittently.  She is wearing a compression sleeve currently.     Activities of Daily Living:  Patient reports morning stiffness for 1-1.5 hours.   Patient Reports nocturnal pain.  Difficulty dressing/grooming: Denies Difficulty climbing stairs: Reports Difficulty getting out of chair: Reports Difficulty using hands for taps, buttons, cutlery, and/or writing: Reports  Review of Systems  Constitutional: Positive for fatigue.  HENT: Positive for mouth sores. Negative for mouth dryness and nose dryness.   Eyes: Negative for pain, itching, visual  disturbance and dryness.  Respiratory: Negative for cough, hemoptysis, shortness of breath, wheezing and difficulty breathing.   Cardiovascular: Negative for chest pain, palpitations, hypertension and swelling in legs/feet.  Gastrointestinal: Positive for constipation. Negative for abdominal pain, blood in stool and diarrhea.  Endocrine: Negative for increased urination.  Genitourinary: Negative for difficulty urinating, painful urination and pelvic pain.  Musculoskeletal: Positive for arthralgias, joint pain and morning stiffness. Negative for joint swelling, myalgias, muscle weakness, muscle tenderness and myalgias.  Skin: Negative for color change, pallor, rash, hair loss, nodules/bumps, redness, skin tightness, ulcers and sensitivity to sunlight.  Allergic/Immunologic: Negative for susceptible to infections.  Neurological: Negative for dizziness, light-headedness, numbness, headaches and memory loss.  Hematological: Negative for swollen glands.  Psychiatric/Behavioral: Negative for depressed mood, confusion and sleep disturbance. The patient is not nervous/anxious.     PMFS History:  Patient Active Problem List   Diagnosis Date Noted  . Leukocytosis 11/04/2016  . Fibromyalgia 02/10/2016  . Other fatigue 02/10/2016  . Primary osteoarthritis of both hands 02/10/2016  . History of migraine 02/10/2016  . History of thyroid nodule 02/10/2016  . History of sleep apnea 02/10/2016  . History of renal calculi 02/10/2016  . Pain in joint, shoulder region 10/02/2010  . Adhesive capsulitis of shoulder 10/02/2010  . Muscle weakness (generalized) 10/02/2010    Past Medical History:  Diagnosis Date  . Arthritis   . Asthma 2007  . Diabetes mellitus   .  Fibromyalgia   . Gout   . Hematuria   . History of tics   . Interstitial cystitis 2016  . Migraines   . Osteoarthritis   . PCOS (polycystic ovarian syndrome) 1993  . PONV (postoperative nausea and vomiting)   . Sleep apnea   . 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 Thyroid nodule     Family History  Problem Relation Age of Onset  . Fibromyalgia Mother   . Psoriasis Mother        psoriatic arthritis   . Diabetes Mother   . Heart disease Mother   . Psoriasis Sister        psoriatic arthritis   . Diabetes Sister   . Rheum arthritis Sister   . Fibromyalgia Sister   . Diabetes Sister    Past Surgical History:  Procedure Laterality Date  . ABDOMINAL HYSTERECTOMY  2000  . APPENDECTOMY  1976  . BREAST LUMPECTOMY  1989   lt-negative  . CARDIOVASCULAR STRESS TEST  2000  . CHOLECYSTECTOMY  1985  . COLONOSCOPY    . HYSTERECTOMY ABDOMINAL WITH SALPINGECTOMY Bilateral 2000  . KIDNEY STONE SURGERY Right 2014   with stent placement in OR  . KNEE ARTHROSCOPY Right 08/22/2013   Procedure: ARTHROSCOPY RIGHT KNEE FOR INFECTION LAVAGE AND DRAINAGE;  Surgeon: Thera FlakeW D Caffrey Jr., MD;  Location: [facility];  Service: Orthopedics;  Laterality: Right;  . KNEE ARTHROSCOPY WITH PATELLA RECONSTRUCTION Right 08/06/2013   Procedure: RIGHT KNEE ARTHROSCOPY WITH MENISCECTOMY MEDIAL, ARTHROSCOPY KNEE WITH DEBRIDEMENT/SHAVING (CHONDROPLASTY) ;  Surgeon: Thera FlakeW D Caffrey Jr., MD;  Location: [facility];  Service: Orthopedics;  Laterality: Right;  . NASAL SINUS SURGERY     x4  . OOPHORECTOMY    . SHOULDER SURGERY  2011   right shoulder  . THYROID SURGERY  1994  . TONSILLECTOMY Bilateral 1974   Social History   Social History Narrative  . Not on file    There is no immunization history on file for this patient.   Objective: Vital Signs: BP 134/85 (BP Location: Left Arm, Patient Position: Sitting, Cuff Size: Normal)   Pulse 71   Resp 14   Ht 5\' 4"  (1.626 m)   Wt 162 lb (73.5 kg)   BMI 27.81 kg/m    Physical Exam Vitals signs and nursing note reviewed.  Constitutional:      Appearance: She is well-developed.  HENT:     Head: Normocephalic and atraumatic.   Eyes:     Conjunctiva/sclera: Conjunctivae normal.  Neck:     Musculoskeletal: Normal range of motion.  Cardiovascular:     Rate and Rhythm: Normal rate and regular rhythm.     Heart sounds: Normal heart sounds.  Pulmonary:     Effort: Pulmonary effort is normal.     Breath sounds: Normal breath sounds.  Abdominal:     General: Bowel sounds are normal.     Palpations: Abdomen is soft.  Lymphadenopathy:     Cervical: No cervical adenopathy.  Skin:    General: Skin is warm and dry.     Capillary Refill: Capillary refill takes less than 2 seconds.  Neurological:     Mental Status: She is alert and oriented to person, place, and time.  Psychiatric:        Behavior: Behavior normal.      Musculoskeletal Exam: Generalized hyperalgesia and positive tender points.  C-spine, thoracic spine, and lumbar  spine good ROM.  No midline spinal tenderness.  Shoulder joints, elbow joints, wrist joints, MCPs,PIPs, and DIPs good ROM with no synovitis.  Complete fist formation bilaterally.  Hip joints, knee joints, ankle joints, MTPs, PIPs, and DIPs good ROM with no synovitis.  No warmth or effusion of knee joints.  No tenderness or swelling of ankle joints.    CDAI Exam: CDAI Score: - Patient Global: -; Provider Global: - Swollen: -; Tender: - Joint Exam   No joint exam has been documented for this visit   There is currently no information documented on the homunculus. Go to the Rheumatology activity and complete the homunculus joint exam.  Investigation: No additional findings.  Imaging: Mm Diag Breast Tomo Uni Left  Result Date: 08/29/2018 CLINICAL DATA:  Follow-up for probably benign left breast mass felt to be related to fat necrosis. EXAM: DIGITAL DIAGNOSTIC UNILATERAL LEFT MAMMOGRAM WITH CAD AND TOMO COMPARISON:  Previous exam(s). ACR Breast Density Category b: There are scattered areas of fibroglandular density. FINDINGS: Cc and MLO tomograms were performed of the left breast. The  previously seen probably benign mass in the superior left breast is no longer identified compatible with an area of fat necrosis which has since resolved. There is no mammographic evidence of malignancy. Mammographic images were processed with CAD. IMPRESSION: No mammographic evidence of malignancy. RECOMMENDATION: Recommend annual routine screening mammography, due February 2020. I have discussed the findings and recommendations with the patient. Results were also provided in writing at the conclusion of the visit. If applicable, a reminder letter will be sent to the patient regarding the next appointment. BI-RADS CATEGORY  1: Negative. Electronically Signed   By: Everlean Alstrom M.D.   On: 08/29/2018 15:19   Xr Hand 2 View Left  Result Date: 09/12/2018 CMC, PIP and DIP narrowing was noted.  No MCP, intercarpal radiocarpal joint space narrowing was noted.  No erosive changes were noted. Impression: These findings are consistent with osteoarthritis of the hand.   Recent Labs: Lab Results  Component Value Date   WBC 14.5 (H) 11/04/2016   HGB 13.1 11/04/2016   PLT 348 11/04/2016   NA 138 11/04/2016   K 3.9 11/04/2016   CL 107 11/04/2016   CO2 23 11/04/2016   GLUCOSE 104 (H) 11/04/2016   BUN 27 (H) 11/04/2016   CREATININE 0.87 11/04/2016   BILITOT 0.6 11/04/2016   ALKPHOS 59 11/04/2016   AST 24 11/04/2016   ALT 32 11/04/2016   PROT 6.5 11/04/2016   ALBUMIN 4.0 11/04/2016   CALCIUM 9.3 11/04/2016   GFRAA >60 11/04/2016    Speciality Comments: No specialty comments available.  Procedures:  No procedures performed Allergies: Sulfa antibiotics, Sulfa drugs cross reactors, Codeine, Erythromycin, Metronidazole, and Morphine and related   Assessment / Plan:     Visit Diagnoses: Fibromyalgia -She has generalized hyperalgesia and positive tender points on exam.  She has generalized muscle aches and muscle tenderness due to fibromyalgia.  She has left trapezius muscle tension and tenderness.   She has been having worsening fatigue recently.  Her fatigue is related to insomnia.  She has difficulty falling asleep as well as staying asleep.  She continues to take baclofen 10 mg twice daily PRN for muscle spasms and Zanaflex 4 mg by mouth at bedtime as needed.  She also takes Cymbalta 120 mg by mouth daily.  She was encouraged to stay active and exercise on a regular basis.  She will follow-up in the office in 6 months.  Primary  osteoarthritis of both hands-She has PIP and DIP synovial thickening consistent with osteoarthritis of both hands.  She has complete fist formation bilaterally.  She is been having increased pain in bilateral hands for the past 1 month.  She has been experiencing increased morning stiffness and nocturnal numbness and pain in both hands.  X-rays of both hands were obtained today which were consistent with osteoarthritis.  No erosive changes were noted.  We will obtain the following lab work.  She was encouraged use Voltaren gel and perform hand exercises.  She was given a handout of hand exercises to perform.  Pain in both hands -She presents today with pain in both hands which is progressively getting getting worse over the past 1 month.  She has no synovitis on exam but does have tenderness of MCP and PIP joints.  She has complete fist formation bilaterally.  She has been experiencing stiffness in the morning in both hands.  She experiences nocturnal pain and numbness in both hands.  She does have known carpal tunnel in the right wrist and has been wearing a night carpal tunnel splint.  She was given a prescription for a left carpal tunnel night splint as well.  X-rays of both hands were obtained today.  X-rays of both hands were consistent with osteoarthritis.  No erosive changes were noted.  We also obtain the following lab work including uric acid level, 14-day data, CCP, RF, and sed rate.  We will call her with these lab results.  She was encouraged to use Voltaren gel  topically as needed for pain relief.  She was also given a handout of hand exercises to perform.  Joint protection and muscle strengthening were discussed.  Plan: XR Hand 2 View Right, XR Hand 2 View Left, Uric acid, 14-3-3 eta Protein, Cyclic citrul peptide antibody, IgG, Rheumatoid factor, Sedimentation rate  Trochanteric bursitis of right hip - She has intermittent right trochanteric bursitis.   Primary osteoarthritis of right knee - She has chronic right knee joint pain.  She has good ROM with no discomfort at this time.  No warmth or effusion noted.   Chronic SI joint pain -She has no SI joint pain at this time.   DDD (degenerative disc disease), lumbar - She has no midline spinal tenderness.  Other fatigue - Chronic but stable.   Other medical conditions are listed as follows:   History of positive PPD - treated 1963    History of gastroesophageal reflux (GERD)   History of thyroid nodule   History of depression  History of tics   History of sleep apnea   History of renal calculi   History of diabetes mellitus   History of migraine   History of PCOS   History of hematuria     Orders: Orders Placed This Encounter  Procedures  . XR Hand 2 View Right  . XR Hand 2 View Left  . Uric acid  . 14-3-3 eta Protein  . Cyclic citrul peptide antibody, IgG  . Rheumatoid factor  . Sedimentation rate   No orders of the defined types were placed in this encounter.   Face-to-face time spent with patient was 30  minutes. Greater than 50% of time was spent in counseling and coordination of care.  Follow-Up Instructions: Return in about 6 months (around 03/15/2019) for Fibromyalgia, Osteoarthritis.   Gearldine Bienenstockaylor M Todrick Siedschlag, PA-C   I examined and evaluated the patient with Sherron Alesaylor Arrion Broaddus PA.Marland Kitchen.  Patient had no synovitis on my examination.  She had tenderness on palpation over right third MCP and PIP joints.  X-rays obtained today were consistent with osteoarthritis.  I will obtain labs as  mentioned above.  The plan of care was discussed as noted above.  Pollyann Savoy, MD  Note - This record has been created using Animal nutritionist.  Chart creation errors have been sought, but may not always  have been located. Such creation errors do not reflect on  the standard of medical care.

## 2018-09-12 ENCOUNTER — Encounter: Payer: Self-pay | Admitting: Rheumatology

## 2018-09-12 ENCOUNTER — Other Ambulatory Visit: Payer: Self-pay

## 2018-09-12 ENCOUNTER — Ambulatory Visit: Payer: Self-pay

## 2018-09-12 ENCOUNTER — Ambulatory Visit (INDEPENDENT_AMBULATORY_CARE_PROVIDER_SITE_OTHER): Payer: Medicare Other | Admitting: Rheumatology

## 2018-09-12 VITALS — BP 134/85 | HR 71 | Resp 14 | Ht 64.0 in | Wt 162.0 lb

## 2018-09-12 DIAGNOSIS — Z9289 Personal history of other medical treatment: Secondary | ICD-10-CM

## 2018-09-12 DIAGNOSIS — Z8669 Personal history of other diseases of the nervous system and sense organs: Secondary | ICD-10-CM

## 2018-09-12 DIAGNOSIS — M51369 Other intervertebral disc degeneration, lumbar region without mention of lumbar back pain or lower extremity pain: Secondary | ICD-10-CM

## 2018-09-12 DIAGNOSIS — M1711 Unilateral primary osteoarthritis, right knee: Secondary | ICD-10-CM

## 2018-09-12 DIAGNOSIS — M19042 Primary osteoarthritis, left hand: Secondary | ICD-10-CM

## 2018-09-12 DIAGNOSIS — Z87442 Personal history of urinary calculi: Secondary | ICD-10-CM

## 2018-09-12 DIAGNOSIS — M5136 Other intervertebral disc degeneration, lumbar region: Secondary | ICD-10-CM

## 2018-09-12 DIAGNOSIS — M533 Sacrococcygeal disorders, not elsewhere classified: Secondary | ICD-10-CM

## 2018-09-12 DIAGNOSIS — M797 Fibromyalgia: Secondary | ICD-10-CM | POA: Diagnosis not present

## 2018-09-12 DIAGNOSIS — Z8742 Personal history of other diseases of the female genital tract: Secondary | ICD-10-CM

## 2018-09-12 DIAGNOSIS — M19041 Primary osteoarthritis, right hand: Secondary | ICD-10-CM | POA: Diagnosis not present

## 2018-09-12 DIAGNOSIS — M7061 Trochanteric bursitis, right hip: Secondary | ICD-10-CM

## 2018-09-12 DIAGNOSIS — Z8639 Personal history of other endocrine, nutritional and metabolic disease: Secondary | ICD-10-CM

## 2018-09-12 DIAGNOSIS — M79642 Pain in left hand: Secondary | ICD-10-CM

## 2018-09-12 DIAGNOSIS — R5383 Other fatigue: Secondary | ICD-10-CM

## 2018-09-12 DIAGNOSIS — M79641 Pain in right hand: Secondary | ICD-10-CM | POA: Diagnosis not present

## 2018-09-12 DIAGNOSIS — Z8719 Personal history of other diseases of the digestive system: Secondary | ICD-10-CM

## 2018-09-12 DIAGNOSIS — Z87448 Personal history of other diseases of urinary system: Secondary | ICD-10-CM

## 2018-09-12 DIAGNOSIS — Z8659 Personal history of other mental and behavioral disorders: Secondary | ICD-10-CM

## 2018-09-12 DIAGNOSIS — G8929 Other chronic pain: Secondary | ICD-10-CM

## 2018-09-12 NOTE — Patient Instructions (Signed)
Hand Exercises °Hand exercises can be helpful for almost anyone. These exercises can strengthen the hands, improve flexibility and movement, and increase blood flow to the hands. These results can make work and daily tasks easier. Hand exercises can be especially helpful for people who have joint pain from arthritis or have nerve damage from overuse (carpal tunnel syndrome). These exercises can also help people who have injured a hand. °Exercises °Most of these hand exercises are gentle stretching and motion exercises. It is usually safe to do them often throughout the day. Warming up your hands before exercise may help to reduce stiffness. You can do this with gentle massage or by placing your hands in warm water for 10-15 minutes. °It is normal to feel some stretching, pulling, tightness, or mild discomfort as you begin new exercises. This will gradually improve. Stop an exercise right away if you feel sudden, severe pain or your pain gets worse. Ask your health care provider which exercises are best for you. °Knuckle bend or "claw" fist °1. Stand or sit with your arm, hand, and all five fingers pointed straight up. Make sure to keep your wrist straight during the exercise. °2. Gently bend your fingers down toward your palm until the tips of your fingers are touching the top of your palm. Keep your big knuckle straight and just bend the small knuckles in your fingers. °3. Hold this position for __________ seconds. °4. Straighten (extend) your fingers back to the starting position. °Repeat this exercise 5-10 times with each hand. °Full finger fist °1. Stand or sit with your arm, hand, and all five fingers pointed straight up. Make sure to keep your wrist straight during the exercise. °2. Gently bend your fingers into your palm until the tips of your fingers are touching the middle of your palm. °3. Hold this position for __________ seconds. °4. Extend your fingers back to the starting position, stretching every  joint fully. °Repeat this exercise 5-10 times with each hand. °Straight fist °1. Stand or sit with your arm, hand, and all five fingers pointed straight up. Make sure to keep your wrist straight during the exercise. °2. Gently bend your fingers at the big knuckle, where your fingers meet your hand, and the middle knuckle. Keep the knuckle at the tips of your fingers straight and try to touch the bottom of your palm. °3. Hold this position for __________ seconds. °4. Extend your fingers back to the starting position, stretching every joint fully. °Repeat this exercise 5-10 times with each hand. °Tabletop °1. Stand or sit with your arm, hand, and all five fingers pointed straight up. Make sure to keep your wrist straight during the exercise. °2. Gently bend your fingers at the big knuckle, where your fingers meet your hand, as far down as you can while keeping the small knuckles in your fingers straight. Think of forming a tabletop with your fingers. °3. Hold this position for __________ seconds. °4. Extend your fingers back to the starting position, stretching every joint fully. °Repeat this exercise 5-10 times with each hand. °Finger spread °1. Place your hand flat on a table with your palm facing down. Make sure your wrist stays straight as you do this exercise. °2. Spread your fingers and thumb apart from each other as far as you can until you feel a gentle stretch. Hold this position for __________ seconds. °3. Bring your fingers and thumb tight together again. Hold this position for __________ seconds. °Repeat this exercise 5-10 times with each hand. °  Making circles °1. Stand or sit with your arm, hand, and all five fingers pointed straight up. Make sure to keep your wrist straight during the exercise. °2. Make a circle by touching the tip of your thumb to the tip of your index finger. °3. Hold for __________ seconds. Then open your hand wide. °4. Repeat this motion with your thumb and each finger on your  hand. °Repeat this exercise 5-10 times with each hand. °Thumb motion °1. Sit with your forearm resting on a table and your wrist straight. Your thumb should be facing up toward the ceiling. Keep your fingers relaxed as you move your thumb. °2. Lift your thumb up as high as you can toward the ceiling. Hold for __________ seconds. °3. Bend your thumb across your palm as far as you can, reaching the tip of your thumb for the small finger (pinkie) side of your palm. Hold for __________ seconds. °Repeat this exercise 5-10 times with each hand. °Grip strengthening ° °1. Hold a stress ball or other soft ball in the middle of your hand. °2. Slowly increase the pressure, squeezing the ball as much as you can without causing pain. Think of bringing the tips of your fingers into the middle of your palm. All of your finger joints should bend when doing this exercise. °3. Hold your squeeze for __________ seconds, then relax. °Repeat this exercise 5-10 times with each hand. °Contact a health care provider if: °· Your hand pain or discomfort gets much worse when you do an exercise. °· Your hand pain or discomfort does not improve within 2 hours after you exercise. °If you have any of these problems, stop doing these exercises right away. Do not do them again unless your health care provider says that you can. °Get help right away if: °· You develop sudden, severe hand pain or swelling. If this happens, stop doing these exercises right away. Do not do them again unless your health care provider says that you can. °This information is not intended to replace advice given to you by your health care provider. Make sure you discuss any questions you have with your health care provider. °Document Released: 01/20/2015 Document Revised: 06/01/2018 Document Reviewed: 02/09/2018 °Elsevier Patient Education © 2020 [employer] ° °

## 2018-09-16 LAB — SEDIMENTATION RATE: Sed Rate: 2 mm/h (ref 0–30)

## 2018-09-16 LAB — CYCLIC CITRUL PEPTIDE ANTIBODY, IGG: Cyclic Citrullin Peptide Ab: 19 UNITS

## 2018-09-16 LAB — RHEUMATOID FACTOR: Rheumatoid fact SerPl-aCnc: <14 IU/mL (ref <14)

## 2018-09-16 LAB — 14-3-3 ETA PROTEIN: 14-3-3 eta Protein: 0.8 ng/mL — ABNORMAL HIGH (ref <0.2)

## 2018-09-16 LAB — URIC ACID: Uric Acid, Serum: 7 mg/dL (ref 2.5–7.0)

## 2018-09-18 NOTE — Progress Notes (Signed)
14-3-3 eta is positive.  RF and CCP negative.  Uric acid is within desirable range.  Sed rate WNL.   Please schedule an ultrasound of both hands to assess for synovitis.

## 2018-09-19 ENCOUNTER — Other Ambulatory Visit: Payer: Self-pay | Admitting: Rheumatology

## 2018-09-19 NOTE — Telephone Encounter (Signed)
Last Visit: 09/12/18 Next Visit: 03/15/19  Okay to refill per Dr. Deveshwar  

## 2018-09-21 NOTE — Progress Notes (Signed)
Office Visit Note  Patient: Christina Lewis             Date of Birth: January 11, 1956           MRN: [id number]             PCP: Kirstie PeriShah, Ashish, MD Referring: Kirstie PeriShah, Ashish, MD Visit Date: 09/22/2018 Occupation: @GUAROCC @  Subjective:  Discuss Plaquenil   History of Present Illness: Christina Hayeresa H Claybrook is a 63 y.o. female with history of seronegative rheumatoid arthritis, fibromyalgia, and DDD.  She presents today with pain in bilateral hands and bilateral wrist joints.  She says she is been having morning stiffness for 1.5 hours daily.  She continues to have chronic right knee joint pain and notices mild swelling at times.  She is also been having increased pain in bilateral feet but has not noticed any joint swelling.  She continues have generalized muscle aches muscle tenderness due to fibromyalgia.  Her chronic fatigue is related to insomnia. She presents today to discuss starting on Plaquenil due to recent lab work.  She reports that she has family history of psoriatic arthritis (sister and mother) and rheumatoid arthritis (sister).  Activities of Daily Living:  Patient reports morning stiffness for 1.5 hours.   Patient Denies nocturnal pain.  Difficulty dressing/grooming: Denies Difficulty climbing stairs: Reports Difficulty getting out of chair: Reports Difficulty using hands for taps, buttons, cutlery, and/or writing: Reports  Review of Systems  Constitutional: Positive for fatigue.  HENT: Negative for mouth sores, mouth dryness and nose dryness.   Eyes: Positive for dryness. Negative for pain and visual disturbance.  Respiratory: Negative for cough, hemoptysis, shortness of breath and difficulty breathing.   Cardiovascular: Positive for swelling in legs/feet. Negative for chest pain, palpitations and hypertension.  Gastrointestinal: Positive for constipation. Negative for blood in stool and diarrhea.  Endocrine: Negative for increased urination.  Genitourinary: Negative for painful  urination.  Musculoskeletal: Positive for arthralgias, joint pain, morning stiffness and muscle tenderness. Negative for joint swelling, myalgias, muscle weakness and myalgias.  Skin: Negative for color change, pallor, rash, hair loss, nodules/bumps, skin tightness, ulcers and sensitivity to sunlight.  Neurological: Negative for dizziness, numbness and headaches.  Hematological: Negative for bruising/bleeding tendency and swollen glands.  Psychiatric/Behavioral: Positive for sleep disturbance. Negative for depressed mood. The patient is not nervous/anxious.     PMFS History:  Patient Active Problem List   Diagnosis Date Noted   Leukocytosis 11/04/2016   Fibromyalgia 02/10/2016   Other fatigue 02/10/2016   Primary osteoarthritis of both hands 02/10/2016   History of migraine 02/10/2016   History of thyroid nodule 02/10/2016   History of sleep apnea 02/10/2016   History of renal calculi 02/10/2016   Pain in joint, shoulder region 10/02/2010   Adhesive capsulitis of shoulder 10/02/2010   Muscle weakness (generalized) 10/02/2010    Past Medical History:  Diagnosis Date   Arthritis    Asthma 2007   Diabetes mellitus    Fibromyalgia    Gout    Hematuria    History of tics    Interstitial cystitis 2016   Migraines    Osteoarthritis    PCOS (polycystic ovarian syndrome) 1993   PONV (postoperative nausea and vomiting)    Sleep apnea    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, took medication for a year   Thyroid nodule     Family History  Problem Relation Age of Onset   Fibromyalgia Mother    Psoriasis Mother  psoriatic arthritis    Diabetes Mother    Heart disease Mother    Psoriasis Sister        psoriatic arthritis    Diabetes Sister    Rheum arthritis Sister    Fibromyalgia Sister    Diabetes Sister    Past Surgical History:  Procedure Laterality Date   ABDOMINAL HYSTERECTOMY  2000   APPENDECTOMY  1976    BREAST LUMPECTOMY  1989   lt-negative   CARDIOVASCULAR STRESS TEST  2000   CHOLECYSTECTOMY  1985   COLONOSCOPY     HYSTERECTOMY ABDOMINAL WITH SALPINGECTOMY Bilateral 2000   KIDNEY STONE SURGERY Right 2014   with stent placement in OR   KNEE ARTHROSCOPY Right 08/22/2013   Procedure: ARTHROSCOPY RIGHT KNEE FOR INFECTION LAVAGE AND DRAINAGE;  Surgeon: Thera Flake., MD;  Location: [facility];  Service: Orthopedics;  Laterality: Right;   KNEE ARTHROSCOPY WITH PATELLA RECONSTRUCTION Right 08/06/2013   Procedure: RIGHT KNEE ARTHROSCOPY WITH MENISCECTOMY MEDIAL, ARTHROSCOPY KNEE WITH DEBRIDEMENT/SHAVING (CHONDROPLASTY) ;  Surgeon: Thera Flake., MD;  Location: [facility];  Service: Orthopedics;  Laterality: Right;   NASAL SINUS SURGERY     x4   OOPHORECTOMY     SHOULDER SURGERY  2011   right shoulder   THYROID SURGERY  1994   TONSILLECTOMY Bilateral 1974   Social History   Social History Narrative   Not on file    There is no immunization history on file for this patient.   Objective: Vital Signs: BP 120/67 (BP Location: Left Arm, Patient Position: Sitting, Cuff Size: Normal)    Pulse 69    Resp 14    Ht 5\' 4"  (1.626 m)    Wt 163 lb 6.4 oz (74.1 kg)    BMI 28.05 kg/m    Physical Exam Vitals signs and nursing note reviewed.  Constitutional:      Appearance: She is well-developed.  HENT:     Head: Normocephalic and atraumatic.  Eyes:     Conjunctiva/sclera: Conjunctivae normal.  Neck:     Musculoskeletal: Normal range of motion.  Cardiovascular:     Rate and Rhythm: Normal rate and regular rhythm.     Heart sounds: Normal heart sounds.  Pulmonary:     Effort: Pulmonary effort is normal.     Breath sounds: Normal breath sounds.  Abdominal:     General: Bowel sounds are normal.     Palpations: Abdomen is soft.  Lymphadenopathy:     Cervical: No cervical adenopathy.  Skin:    General: Skin is warm and dry.     Capillary Refill:  Capillary refill takes less than 2 seconds.  Neurological:     Mental Status: She is alert and oriented to person, place, and time.  Psychiatric:        Behavior: Behavior normal.      Musculoskeletal Exam: C-spine, thoracic spine, and lumbar spine good ROM.  Scoliosis noted. No midline spinal tenderness.  No SI joint tenderness.  Shoulder joints, elbow joints, wrist joints, MCPs, PIPs, and DIPs good ROM with no synovitis. Tenderness of both wrist joints. Tenderness of all MCPs and PIPs.  Hip joints, knee joints, ankle joints, MTPs, PIPs, and DIPs good ROM with no synovitis. Right knee painful ROM.  She is wearing a compression sleeve on the right knee joint.  No warmth or effusion of knee joints.  No tenderness or swelling of ankle joints.    CDAI Exam: CDAI Score: --  Patient Global: --; Provider Global: -- Swollen: 0 ; Tender: 25  Joint Exam      Right  Left  Elbow   Tender     Wrist   Tender   Tender  MCP 1   Tender   Tender  MCP 2   Tender   Tender  MCP 3   Tender   Tender  MCP 4   Tender   Tender  MCP 5   Tender   Tender  IP   Tender   Tender  PIP 2   Tender   Tender  PIP 3   Tender   Tender  PIP 4   Tender   Tender  PIP 5   Tender   Tender  DIP 3   Tender     Knee   Tender        Investigation: No additional findings.  Imaging: Mm Diag Breast Tomo Uni Left  Result Date: 08/29/2018 CLINICAL DATA:  Follow-up for probably benign left breast mass felt to be related to fat necrosis. EXAM: DIGITAL DIAGNOSTIC UNILATERAL LEFT MAMMOGRAM WITH CAD AND TOMO COMPARISON:  Previous exam(s). ACR Breast Density Category b: There are scattered areas of fibroglandular density. FINDINGS: Cc and MLO tomograms were performed of the left breast. The previously seen probably benign mass in the superior left breast is no longer identified compatible with an area of fat necrosis which has since resolved. There is no mammographic evidence of malignancy. Mammographic images were processed with CAD.  IMPRESSION: No mammographic evidence of malignancy. RECOMMENDATION: Recommend annual routine screening mammography, due February 2020. I have discussed the findings and recommendations with the patient. Results were also provided in writing at the conclusion of the visit. If applicable, a reminder letter will be sent to the patient regarding the next appointment. BI-RADS CATEGORY  1: Negative. Electronically Signed   By: Edwin Cap M.D.   On: 08/29/2018 15:19   Xr Hand 2 View Left  Result Date: 09/12/2018 CMC, PIP and DIP narrowing was noted.  No MCP, intercarpal radiocarpal joint space narrowing was noted.  No erosive changes were noted. Impression: These findings are consistent with osteoarthritis of the hand.  Xr Hand 2 View Right  Result Date: 09/12/2018 PIP and DIP narrowing was noted.  No MCP, intercarpal radiocarpal joint space narrowing was noted.  No erosive changes were noted.  Small spur was noted at the second PIP joint. Impression: These findings are consistent with osteoarthritis of the hand.   Recent Labs: Lab Results  Component Value Date   WBC 14.5 (H) 11/04/2016   HGB 13.1 11/04/2016   PLT 348 11/04/2016   NA 138 11/04/2016   K 3.9 11/04/2016   CL 107 11/04/2016   CO2 23 11/04/2016   GLUCOSE 104 (H) 11/04/2016   BUN 27 (H) 11/04/2016   CREATININE 0.87 11/04/2016   BILITOT 0.6 11/04/2016   ALKPHOS 59 11/04/2016   AST 24 11/04/2016   ALT 32 11/04/2016   PROT 6.5 11/04/2016   ALBUMIN 4.0 11/04/2016   CALCIUM 9.3 11/04/2016   GFRAA >60 11/04/2016    Speciality Comments: No specialty comments available.  Procedures:  No procedures performed Allergies: Sulfa antibiotics, Sulfa drugs cross reactors, Codeine, Erythromycin, Metronidazole, and Morphine and related    Assessment / Plan:     Visit Diagnoses: Rheumatoid arthritis of multiple sites with negative rheumatoid factor (HCC) - RF-, CCP-, 14-3-3 eta positive  - She has tenderness of both wrist joints, all  MCP and PIP joints  in both hands. She has no obvious synovitis at this time.  She has been experiencing increased morning stiffness in both hands and both wrist joints lasting about 1.5 hours.  She has a strong family history of psoriatic arthritis (mother, sister) and rheumatoid arthritis (sister).  X-rays of both hands were obtained on 09/12/18, which revealed findings consistent with osteoarthritis of both hands.  14-3-3 eta was positive on 09/12/18.  RF-, CCP-, and sed rate were WNL.  Due to her strong family history and exquisite joint tenderness and increased morning stiffness we will start her on a trial of Plaquenil.  Indications, contraindications, potential side effects of Plaquenil were discussed.  All questions were addressed and consent was obtained today.  She was given a Plaquenil eye exam form to take with her to her appointment.  We discussed having a Plaquenil eye exam within the first month of starting Plaquenil.  We will check CBC, CMP, and G6PD today.  We will send in a prescription for Plaquenil once her labs have resulted. She will return for lab work in 1 month and then every 5 months to monitor for drug toxicity.  She was advised to notify us if she cannot tolerate taking Plaquenil.  She was advised to notify us if develops increased joint pain or joint swelling.  She will follow-up in the office in 3 months  Patient was counseled on the purpose, proper use, and adverse effects of hydroxychloroquine including nausea/diarrhea, skin rash, headaches, and sun sensitivity.  Discussed importance of annual eye exams while on hydroxychloroquine to monitor to ocular toxicity and discussed importance of frequent laboratory monitoring.  Provided patient with eye exam form for baseline ophthalmologic exam.  Provided patient with educational materials on hydroxychloroquine and answered all questions.  Patient consented to hydroxychloroquine.  Will upload consent in the media tab.    Prescription pending  lab results.  High risk medication use - She was consented to start on Plaquenil.  She was given a Plaquenil eye exam form to take with her to her upcoming baseline eye exam appointment.  CBC, CMP, and G6PD were checked today.  She will return for lab work in 1 month and then every 5 months.- Plan: COMPLETE METABOLIC PANEL WITH GFR, CBC with Differential/Platelet, Glucose 6 phosphate dehydrogenase  Fibromyalgia - She continues to have generalized muscle aches and muscle tenderness due to fibromyalgia.  She has chronic fatigue related to insomnia.  She continues to have right trochanter bursitis.  She was encouraged to perform stretching exercises on a regular basis.  We discussed the importance of staying active and exercising.  Primary osteoarthritis of both hands -She has PIP and DIP synovial thickening assist with osteoarthritis of bilateral hands.  X-rays of both hands on 09/12/2018 which revealed findings consistent with osteoarthritis.  She has tenderness of all MCP and PIP joints.  She has discomfort and stiffness when making a complete fist.  Joint protection and muscle strengthening were discussed  Primary osteoarthritis of right knee - She has chronic right knee joint pain.  Mild warmth noted.  No joint effusion noted.  She is wearing a compression sleeve.  She has pain with range of motion.  Trochanteric bursitis of right hip - She has tenderness over the right trochanteric bursa.  She was encouraged to perform stretching exercises regularly.   Chronic SI joint pain - She has no SI joint tenderness at this time.   DDD (degenerative disc disease), lumbar - She has good ROM.  No midline spinal  tenderness.  Lumbar scoliosis noted.   Other fatigue: Her level of fatigue has been stable.   Other medical conditions are listed as follows:  History of positive PPD   History of gastroesophageal reflux (GERD)   History of thyroid nodule   History of depression   History of tics   History  of sleep apnea   History of renal calculi   History of diabetes mellitus   History of migraine  History of PCOS  Orders: Orders Placed This Encounter  Procedures   COMPLETE METABOLIC PANEL WITH GFR   CBC with Differential/Platelet   Glucose 6 phosphate dehydrogenase   No orders of the defined types were placed in this encounter.   Face-to-face time spent with patient was 30 minutes. Greater than 50% of time was spent in counseling and coordination of care.  Follow-Up Instructions: Return in 3 months (on 12/23/2018) for Rheumatoid arthritis, Osteoarthritis, Fibromyalgia.   Ofilia Neas, PA-C   I examined and evaluated the patient with Hazel Sams PA.  Patient continues to have some stiffness in her joints from underlying osteoarthritis.  She had no synovitis on my examination.  The plan of care was discussed as noted above.  Bo Merino, MD  Note - This record has been created using Editor, commissioning.  Chart creation errors have been sought, but may not always  have been located. Such creation errors do not reflect on  the standard of medical care.

## 2018-09-22 ENCOUNTER — Ambulatory Visit (INDEPENDENT_AMBULATORY_CARE_PROVIDER_SITE_OTHER): Payer: Medicare Other | Admitting: Rheumatology

## 2018-09-22 ENCOUNTER — Encounter: Payer: Self-pay | Admitting: Rheumatology

## 2018-09-22 ENCOUNTER — Other Ambulatory Visit: Payer: Self-pay

## 2018-09-22 VITALS — BP 120/67 | HR 69 | Resp 14 | Ht 64.0 in | Wt 163.4 lb

## 2018-09-22 DIAGNOSIS — Z8719 Personal history of other diseases of the digestive system: Secondary | ICD-10-CM

## 2018-09-22 DIAGNOSIS — Z9289 Personal history of other medical treatment: Secondary | ICD-10-CM

## 2018-09-22 DIAGNOSIS — Z79899 Other long term (current) drug therapy: Secondary | ICD-10-CM

## 2018-09-22 DIAGNOSIS — M0609 Rheumatoid arthritis without rheumatoid factor, multiple sites: Secondary | ICD-10-CM

## 2018-09-22 DIAGNOSIS — Z8659 Personal history of other mental and behavioral disorders: Secondary | ICD-10-CM

## 2018-09-22 DIAGNOSIS — M19041 Primary osteoarthritis, right hand: Secondary | ICD-10-CM

## 2018-09-22 DIAGNOSIS — M1711 Unilateral primary osteoarthritis, right knee: Secondary | ICD-10-CM

## 2018-09-22 DIAGNOSIS — G8929 Other chronic pain: Secondary | ICD-10-CM

## 2018-09-22 DIAGNOSIS — Z8742 Personal history of other diseases of the female genital tract: Secondary | ICD-10-CM

## 2018-09-22 DIAGNOSIS — Z8669 Personal history of other diseases of the nervous system and sense organs: Secondary | ICD-10-CM

## 2018-09-22 DIAGNOSIS — Z8639 Personal history of other endocrine, nutritional and metabolic disease: Secondary | ICD-10-CM

## 2018-09-22 DIAGNOSIS — M19042 Primary osteoarthritis, left hand: Secondary | ICD-10-CM

## 2018-09-22 DIAGNOSIS — M797 Fibromyalgia: Secondary | ICD-10-CM | POA: Diagnosis not present

## 2018-09-22 DIAGNOSIS — M7061 Trochanteric bursitis, right hip: Secondary | ICD-10-CM

## 2018-09-22 DIAGNOSIS — M5136 Other intervertebral disc degeneration, lumbar region: Secondary | ICD-10-CM

## 2018-09-22 DIAGNOSIS — M51369 Other intervertebral disc degeneration, lumbar region without mention of lumbar back pain or lower extremity pain: Secondary | ICD-10-CM

## 2018-09-22 DIAGNOSIS — Z87442 Personal history of urinary calculi: Secondary | ICD-10-CM

## 2018-09-22 DIAGNOSIS — R5383 Other fatigue: Secondary | ICD-10-CM

## 2018-09-22 DIAGNOSIS — M533 Sacrococcygeal disorders, not elsewhere classified: Secondary | ICD-10-CM

## 2018-09-22 NOTE — Patient Instructions (Signed)
Standing Labs We placed an order today for your standing lab work.    Please come back and get your standing labs in 1 month then every 5 months   We have open lab daily Monday through Thursday from 8:30-12:30 PM and 1:30-4:30 PM and Friday from 8:30-12:30 PM and 1:30 -4:00 PM at the office of Dr. Pollyann Savoy.   You may experience shorter wait times on Monday and Friday afternoons. The office is located at [address], [place], [place] [address] No appointment is necessary.   Labs are drawn by [facility].  You may receive a bill from [facility] for your lab work.  If you wish to have your labs drawn at another location, please call the office 24 hours in advance to send orders.  If you have any questions regarding directions or hours of operation,  please call [phone].   Just as a reminder please drink plenty of water prior to coming for your lab work. Thanks!   Hydroxychloroquine tablets What is this medicine? HYDROXYCHLOROQUINE (hye drox ee KLOR oh kwin) is used to treat rheumatoid arthritis and systemic lupus erythematosus. It is also used to treat malaria. This medicine may be used for other purposes; ask your health care provider or pharmacist if you have questions. COMMON BRAND NAME(S): Plaquenil, Quineprox What should I tell my health care provider before I take this medicine? They need to know if you have any of these conditions:  diabetes  eye disease, vision problems  G6PD deficiency  heart disease  history of irregular heartbeat  if you often drink alcohol  kidney disease  liver disease  porphyria  psoriasis  an unusual or allergic reaction to chloroquine, hydroxychloroquine, other medicines, foods, dyes, or preservatives  pregnant or trying to get pregnant  breast-feeding How should I use this medicine? Take this medicine by mouth with a glass of water. Follow the directions on the prescription label. Do not cut, crush or chew this  medicine. Swallow the tablets whole. Take this medicine with food. Avoid taking antacids within 4 hours of taking this medicine. It is best to separate these medicines by at least 4 hours. Take your medicine at regular intervals. Do not take it more often than directed. Take all of your medicine as directed even if you think you are better. Do not skip doses or stop your medicine early. Talk to your pediatrician regarding the use of this medicine in children. While this drug may be prescribed for selected conditions, precautions do apply. Overdosage: If you think you have taken too much of this medicine contact a poison control center or emergency room at once. NOTE: This medicine is only for you. Do not share this medicine with others. What if I miss a dose? If you miss a dose, take it as soon as you can. If it is almost time for your next dose, take only that dose. Do not take double or extra doses. What may interact with this medicine? Do not take this medicine with any of the following medications:  cisapride  dronedarone  pimozide  thioridazine This medicine may also interact with the following medications:  ampicillin  antacids  cimetidine  cyclosporine  digoxin  kaolin  medicines for diabetes, like insulin, glipizide, glyburide  medicines for seizures like carbamazepine, phenobarbital, phenytoin  mefloquine  methotrexate  other medicines that prolong the QT interval (cause an abnormal heart rhythm)  praziquantel This list may not describe all possible interactions. Give your health care provider a  list of all the medicines, herbs, non-prescription drugs, or dietary supplements you use. Also tell them if you smoke, drink alcohol, or use illegal drugs. Some items may interact with your medicine. What should I watch for while using this medicine? Visit your health care professional for regular checks on your progress. Tell your health care professional if your symptoms  do not start to get better or if they get worse. You may need blood work done while you are taking this medicine. If you take other medicines that can affect heart rhythm, you may need more testing. Talk to your health care professional if you have questions. Your vision may be tested before and during use of this medicine. Tell your health care professional right away if you have any change in your eyesight. What side effects may I notice from receiving this medicine? Side effects that you should report to your doctor or health care professional as soon as possible:  allergic reactions like skin rash, itching or hives, swelling of the face, lips, or tongue  changes in vision  decreased hearing or ringing of the ears  muscle weakness  redness, blistering, peeling or loosening of the skin, including inside the mouth  sensitivity to light  signs and symptoms of a dangerous change in heartbeat or heart rhythm like chest pain; dizziness; fast or irregular heartbeat; palpitations; feeling faint or lightheaded, falls; breathing problems  signs and symptoms of liver injury like dark yellow or brown urine; general ill feeling or flu-like symptoms; light-colored stools; loss of appetite; nausea; right upper belly pain; unusually weak or tired; yellowing of the eyes or skin  signs and symptoms of low blood sugar such as feeling anxious; confusion; dizziness; increased hunger; unusually weak or tired; sweating; shakiness; cold; irritable; headache; blurred vision; fast heartbeat; loss of consciousness  suicidal thoughts  uncontrollable head, mouth, neck, arm, or leg movements Side effects that usually do not require medical attention (report to your doctor or health care professional if they continue or are bothersome):  diarrhea  dizziness  hair loss  headache  irritable  loss of appetite  nausea, vomiting  stomach pain This list may not describe all possible side effects. Call your  doctor for medical advice about side effects. You may report side effects to FDA at 1-800-FDA-1088. Where should I keep my medicine? Keep out of the reach of children. Store at room temperature between 15 and 30 degrees C (59 and 86 degrees F). Protect from moisture and light. Throw away any unused medicine after the expiration date. NOTE: This sheet is a summary. It may not cover all possible information. If you have questions about this medicine, talk to your doctor, pharmacist, or health care provider.  2020 Elsevier/Gold Standard (2018-06-19 12:56:32)

## 2018-09-25 ENCOUNTER — Other Ambulatory Visit: Payer: Self-pay | Admitting: Pharmacist

## 2018-09-25 DIAGNOSIS — M0609 Rheumatoid arthritis without rheumatoid factor, multiple sites: Secondary | ICD-10-CM

## 2018-09-25 LAB — CBC WITH DIFFERENTIAL/PLATELET
Absolute Monocytes: 893 cells/uL (ref 200–950)
Basophils Absolute: 95 cells/uL (ref 0–200)
Basophils Relative: 0.9 %
Eosinophils Absolute: 410 cells/uL (ref 15–500)
Eosinophils Relative: 3.9 %
HCT: 41.1 % (ref 35.0–45.0)
Hemoglobin: 13.7 g/dL (ref 11.7–15.5)
Lymphs Abs: 3287 cells/uL (ref 850–3900)
MCH: 30.7 pg (ref 27.0–33.0)
MCHC: 33.3 g/dL (ref 32.0–36.0)
MCV: 92.2 fL (ref 80.0–100.0)
MPV: 10.6 fL (ref 7.5–12.5)
Monocytes Relative: 8.5 %
Neutro Abs: 5817 cells/uL (ref 1500–7800)
Neutrophils Relative %: 55.4 %
Platelets: 369 10*3/uL (ref 140–400)
RBC: 4.46 10*6/uL (ref 3.80–5.10)
RDW: 13.4 % (ref 11.0–15.0)
Total Lymphocyte: 31.3 %
WBC: 10.5 10*3/uL (ref 3.8–10.8)

## 2018-09-25 LAB — COMPLETE METABOLIC PANEL WITH GFR
AG Ratio: 2.1 (calc) (ref 1.0–2.5)
ALT: 22 U/L (ref 6–29)
AST: 20 U/L (ref 10–35)
Albumin: 4.4 g/dL (ref 3.6–5.1)
Alkaline phosphatase (APISO): 72 U/L (ref 37–153)
BUN: 24 mg/dL (ref 7–25)
CO2: 25 mmol/L (ref 20–32)
Calcium: 10.2 mg/dL (ref 8.6–10.4)
Chloride: 106 mmol/L (ref 98–110)
Creat: 0.82 mg/dL (ref 0.50–0.99)
GFR, Est African American: 90 mL/min/{1.73_m2} (ref >=60)
GFR, Est Non African American: 77 mL/min/{1.73_m2} (ref >=60)
Globulin: 2.1 g/dL (calc) (ref 1.9–3.7)
Glucose, Bld: 86 mg/dL (ref 65–99)
Potassium: 4.5 mmol/L (ref 3.5–5.3)
Sodium: 142 mmol/L (ref 135–146)
Total Bilirubin: 0.3 mg/dL (ref 0.2–1.2)
Total Protein: 6.5 g/dL (ref 6.1–8.1)

## 2018-09-25 LAB — GLUCOSE 6 PHOSPHATE DEHYDROGENASE: G-6PDH: 14.4 U/g Hgb (ref 7.0–20.5)

## 2018-09-25 MED ORDER — HYDROXYCHLOROQUINE SULFATE 200 MG PO TABS: ORAL_TABLET | ORAL | 0 refills | Status: DC

## 2018-09-25 NOTE — Progress Notes (Signed)
G6PD WNL

## 2018-09-25 NOTE — Progress Notes (Signed)
CBC and CMP WNL

## 2018-09-25 NOTE — Progress Notes (Signed)
Ok to send in prescription for Plaquenil

## 2018-09-25 NOTE — Progress Notes (Signed)
CBC/CMP/G6PD within normal limits.  She may start Plaquneil.  Dose will be 200 mg twice daily Monday through Friday based off weight of 74.1 kg and height of 5'4".  Prescription sent to [facility] in [place] per patient request.

## 2018-09-26 ENCOUNTER — Telehealth: Payer: Self-pay | Admitting: *Deleted

## 2018-09-26 NOTE — Telephone Encounter (Signed)
Submitted a Prior Authorization request to [facility] for Plaquenil via Cover My Meds. Will update once we receive a response.

## 2018-09-26 NOTE — Telephone Encounter (Signed)
Received notification from [facility] regarding a prior authorization for Plaquenil. Authorization has been APPROVED.

## 2018-10-19 ENCOUNTER — Other Ambulatory Visit: Payer: Self-pay | Admitting: Rheumatology

## 2018-10-19 NOTE — Telephone Encounter (Signed)
Last Visit: 09/22/18 Next Visit: 12/22/18  Okay to refill per Dr. Estanislado Pandy

## 2018-11-02 ENCOUNTER — Other Ambulatory Visit: Payer: Self-pay | Admitting: Physician Assistant

## 2018-11-02 ENCOUNTER — Telehealth: Payer: Self-pay | Admitting: Rheumatology

## 2018-11-02 DIAGNOSIS — M0609 Rheumatoid arthritis without rheumatoid factor, multiple sites: Secondary | ICD-10-CM

## 2018-11-02 NOTE — Telephone Encounter (Signed)
Patient called stating she has appointment with the eye doctor for her Plaquenil Eye Exam on Thursday, 11/09/18.

## 2018-11-02 NOTE — Telephone Encounter (Signed)
ok 

## 2018-11-02 NOTE — Telephone Encounter (Signed)
Noted  

## 2018-11-02 NOTE — Telephone Encounter (Addendum)
Last Visit: 09/22/18 Next Visit: 12/22/18 Labs: 09/22/18 WNL No baseline PLQ eye exam  Left message to advise patient we need PLQ eye exam.  Okay to refill 30 day supply PLQ?

## 2018-11-07 ENCOUNTER — Telehealth: Payer: Self-pay | Admitting: Rheumatology

## 2018-11-07 DIAGNOSIS — M0609 Rheumatoid arthritis without rheumatoid factor, multiple sites: Secondary | ICD-10-CM

## 2018-11-07 DIAGNOSIS — Z79899 Other long term (current) drug therapy: Secondary | ICD-10-CM

## 2018-11-07 NOTE — Telephone Encounter (Signed)
Patient called requesting labwork orders be sent to [facility] in [place].  Patient states she will be going on Thursday, 11/09/18.

## 2018-11-08 NOTE — Telephone Encounter (Signed)
Lab orders released.  

## 2018-11-09 ENCOUNTER — Telehealth: Payer: Self-pay | Admitting: Rheumatology

## 2018-11-09 NOTE — Telephone Encounter (Signed)
Patient called stating she was diagnosed with upper respiratory infection and given antibiotics and steroids which she began yesterday.  Patient states her PCP wanted her to call Dr. Estanislado Pandy before taking her prescription of Plaquenil.  Patient is requesting a return call.

## 2018-11-09 NOTE — Telephone Encounter (Signed)
Returned patient call.  She was prescribed a z-pack for an upper respiratory infection and PCP advised her to call to see if that was okay to take with Plaquenil.  Patient has no underlying cardiac issues.  An EKG in 2015 showed QTC 440.  She is on multiple QTC prolong agents including Abilify, Risperdal, nortiptyline and Zanaflex.  Advise patient to hold Plaquenil and resume once she is finished with the z-pack due to taking multiple medications that can prolong QTC.  Patient verbalized understanding.  All questions encouraged and answered.  Instructed patient to call with any other questions or concerns.  Mariella Saa, PharmD, [place], [place] [facility]ty Pharmacist [phone]  11/09/2018 10:59 AM

## 2018-11-10 LAB — COMPLETE METABOLIC PANEL WITH GFR
AG Ratio: 2 (calc) (ref 1.0–2.5)
ALT: 22 U/L (ref 6–29)
AST: 20 U/L (ref 10–35)
Albumin: 4.5 g/dL (ref 3.6–5.1)
Alkaline phosphatase (APISO): 70 U/L (ref 37–153)
BUN: 22 mg/dL (ref 7–25)
CO2: 26 mmol/L (ref 20–32)
Calcium: 10.2 mg/dL (ref 8.6–10.4)
Chloride: 106 mmol/L (ref 98–110)
Creat: 0.77 mg/dL (ref 0.50–0.99)
GFR, Est African American: 97 mL/min/{1.73_m2} (ref >=60)
GFR, Est Non African American: 83 mL/min/{1.73_m2} (ref >=60)
Globulin: 2.3 g/dL (calc) (ref 1.9–3.7)
Glucose, Bld: 97 mg/dL (ref 65–139)
Potassium: 4.5 mmol/L (ref 3.5–5.3)
Sodium: 141 mmol/L (ref 135–146)
Total Bilirubin: 0.3 mg/dL (ref 0.2–1.2)
Total Protein: 6.8 g/dL (ref 6.1–8.1)

## 2018-11-10 LAB — CBC WITH DIFFERENTIAL/PLATELET
Absolute Monocytes: 1131 cells/uL — ABNORMAL HIGH (ref 200–950)
Basophils Absolute: 160 cells/uL (ref 0–200)
Basophils Relative: 1.1 %
Eosinophils Absolute: 537 cells/uL — ABNORMAL HIGH (ref 15–500)
Eosinophils Relative: 3.7 %
HCT: 42 % (ref 35.0–45.0)
Hemoglobin: 14.1 g/dL (ref 11.7–15.5)
Lymphs Abs: 3611 cells/uL (ref 850–3900)
MCH: 30.1 pg (ref 27.0–33.0)
MCHC: 33.6 g/dL (ref 32.0–36.0)
MCV: 89.6 fL (ref 80.0–100.0)
MPV: 10.8 fL (ref 7.5–12.5)
Monocytes Relative: 7.8 %
Neutro Abs: 9063 cells/uL — ABNORMAL HIGH (ref 1500–7800)
Neutrophils Relative %: 62.5 %
Platelets: 407 10*3/uL — ABNORMAL HIGH (ref 140–400)
RBC: 4.69 10*6/uL (ref 3.80–5.10)
RDW: 12.8 % (ref 11.0–15.0)
Total Lymphocyte: 24.9 %
WBC: 14.5 10*3/uL — ABNORMAL HIGH (ref 3.8–10.8)

## 2018-11-10 NOTE — Telephone Encounter (Signed)
Labs are stable ,white cell count is mildly elevated.

## 2018-11-20 ENCOUNTER — Other Ambulatory Visit: Payer: Self-pay | Admitting: Rheumatology

## 2018-11-20 NOTE — Telephone Encounter (Signed)
Last Visit: 09/22/18 Next Visit: 12/22/18  Okay to refill per Dr. Deveshwar  

## 2018-11-30 ENCOUNTER — Other Ambulatory Visit: Payer: Self-pay | Admitting: Rheumatology

## 2018-11-30 DIAGNOSIS — M0609 Rheumatoid arthritis without rheumatoid factor, multiple sites: Secondary | ICD-10-CM

## 2018-11-30 NOTE — Telephone Encounter (Signed)
Last Visit: 09/22/18 Next Visit: 12/22/18 Labs: 11/09/18 stable ,white cell count is mildly elevated PLQ Eye Exam: no baseline eye exam on file  Left message to advise patient we need baseline PLQ eye exam  Okay to refill PLQ?

## 2018-11-30 NOTE — Telephone Encounter (Signed)
Ok to refill 30-day supply.  Please advise patient to schedule baseline PLQ eye exam.

## 2018-12-08 NOTE — Progress Notes (Signed)
Office Visit Note  Patient: Christina Lewis             Date of Birth: 04/17/1955           MRN: [id number]             PCP: Kirstie Peri, MD Referring: Kirstie Peri, MD Visit Date: 12/22/2018 Occupation: @GUAROCC @  Subjective:  Neck pain   History of Present Illness: Christina Lewis is a 63 y.o. female with history of seropositive rheumatoid arthritis, osteoarthritis, and fibromyalgia.   She denies any recent rheumatoid arthritis flares.  She is taking Plaquenil 200 mg BID M-F. She presents today with neck pain.  She states she picked up a box at the beginning of October and started having neck discomfort after that.  She was evaluated by her PCP who obtained x-rays and prednisone a prednisone taper.  She states the pain returned about 1 week ago.  She is having radiating pain down the left arm.  She states the pain is severe and it is often difficult to hold her head up at times.  She denies any other joint pain or joint swelling.   Activities of Daily Living:  Patient reports morning stiffness for 1.5 hours.   Patient Reports nocturnal pain.  Difficulty dressing/grooming: Denies Difficulty climbing stairs: Denies Difficulty getting out of chair: Reports Difficulty using hands for taps, buttons, cutlery, and/or writing: Reports  Review of Systems  Constitutional: Positive for fatigue.  HENT: Negative for mouth sores, mouth dryness and nose dryness.   Eyes: Positive for dryness. Negative for pain and visual disturbance.  Respiratory: Negative for cough, hemoptysis, shortness of breath and difficulty breathing.   Cardiovascular: Negative for chest pain, palpitations, hypertension and swelling in legs/feet.  Gastrointestinal: Negative for blood in stool, constipation and diarrhea.  Endocrine: Negative for increased urination.  Genitourinary: Negative for painful urination.  Musculoskeletal: Positive for arthralgias, joint pain, joint swelling and morning stiffness. Negative for  myalgias, muscle weakness, muscle tenderness and myalgias.  Skin: Negative for color change, pallor, rash, hair loss, nodules/bumps, skin tightness, ulcers and sensitivity to sunlight.  Neurological: Negative for dizziness, numbness and headaches.  Hematological: Negative for bruising/bleeding tendency and swollen glands.  Psychiatric/Behavioral: Positive for sleep disturbance. Negative for depressed mood. The patient is not nervous/anxious.     PMFS History:  Patient Active Problem List   Diagnosis Date Noted  . Leukocytosis 11/04/2016  . Fibromyalgia 02/10/2016  . Other fatigue 02/10/2016  . Primary osteoarthritis of both hands 02/10/2016  . History of migraine 02/10/2016  . History of thyroid nodule 02/10/2016  . History of sleep apnea 02/10/2016  . History of renal calculi 02/10/2016  . Pain in joint, shoulder region 10/02/2010  . Adhesive capsulitis of shoulder 10/02/2010  . Muscle weakness (generalized) 10/02/2010    Past Medical History:  Diagnosis Date  . Arthritis   . Asthma 2007  . Diabetes mellitus   . Fibromyalgia   . Gout   . Hematuria   . History of tics   . Interstitial cystitis 2016  . Migraines   . Osteoarthritis   . PCOS (polycystic ovarian syndrome) 1993  . PONV (postoperative nausea and vomiting)   . Sleep apnea   . 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 Thyroid nodule     Family History  Problem Relation Age of Onset  . Fibromyalgia Mother   . Psoriasis Mother  psoriatic arthritis   . Diabetes Mother   . Heart disease Mother   . Psoriasis Sister        psoriatic arthritis   . Diabetes Sister   . Rheum arthritis Sister   . Fibromyalgia Sister   . Diabetes Sister    Past Surgical History:  Procedure Laterality Date  . ABDOMINAL HYSTERECTOMY  2000  . APPENDECTOMY  1976  . BREAST LUMPECTOMY  1989   lt-negative  . CARDIOVASCULAR STRESS TEST  2000  . CHOLECYSTECTOMY  1985  . COLONOSCOPY     . HYSTERECTOMY ABDOMINAL WITH SALPINGECTOMY Bilateral 2000  . KIDNEY STONE SURGERY Right 2014   with stent placement in OR  . KNEE ARTHROSCOPY Right 08/22/2013   Procedure: ARTHROSCOPY RIGHT KNEE FOR INFECTION LAVAGE AND DRAINAGE;  Surgeon: Thera Flake., MD;  Location: [facility];  Service: Orthopedics;  Laterality: Right;  . KNEE ARTHROSCOPY WITH PATELLA RECONSTRUCTION Right 08/06/2013   Procedure: RIGHT KNEE ARTHROSCOPY WITH MENISCECTOMY MEDIAL, ARTHROSCOPY KNEE WITH DEBRIDEMENT/SHAVING (CHONDROPLASTY) ;  Surgeon: Thera Flake., MD;  Location: [facility];  Service: Orthopedics;  Laterality: Right;  . NASAL SINUS SURGERY     x4  . OOPHORECTOMY    . SHOULDER SURGERY  2011   right shoulder  . THYROID SURGERY  1994  . TONSILLECTOMY Bilateral 1974   Social History   Social History Narrative  . Not on file    There is no immunization history on file for this patient.   Objective: Vital Signs: BP 109/69 (BP Location: Left Arm, Patient Position: Sitting, Cuff Size: Normal)   Pulse 95   Resp 16   Ht 5\' 4"  (1.626 m)   Wt 160 lb 6.4 oz (72.8 kg)   BMI 27.53 kg/m    Physical Exam Vitals signs and nursing note reviewed.  Constitutional:      Appearance: She is well-developed.  HENT:     Head: Normocephalic and atraumatic.  Eyes:     Conjunctiva/sclera: Conjunctivae normal.  Neck:     Musculoskeletal: Normal range of motion.  Cardiovascular:     Rate and Rhythm: Normal rate and regular rhythm.     Heart sounds: Normal heart sounds.  Pulmonary:     Effort: Pulmonary effort is normal.     Breath sounds: Normal breath sounds.  Abdominal:     General: Bowel sounds are normal.     Palpations: Abdomen is soft.  Lymphadenopathy:     Cervical: No cervical adenopathy.  Skin:    General: Skin is warm and dry.     Capillary Refill: Capillary refill takes less than 2 seconds.  Neurological:     Mental Status: She is alert and oriented to person,  place, and time.  Psychiatric:        Behavior: Behavior normal.      Musculoskeletal Exam:C-spine limited ROM with lateral rotation to the left.  Discomfort with compression.  Pain with flexion and extension of C-spine. Thoracic and lumbar spine good ROM.  No midline spinal tenderness.  No SI joint tenderness. Shoulder joints, elbow joints, wrist joints, MCPs, PIPs, and DIPs good ROM with no synovitis.  PIP and DIP synovial thickening consistent with osteoarthritis of both hands. Hip joints, knee joints, ankle joints, MTPs, PIPs, and DIPs good ROM with no synovitis.  No warmth or effusion of knee joints.  No tenderness or swelling of ankle joints.    CDAI Exam: CDAI Score: 0.4  Patient Global: 2 mm; Provider  Global: 2 mm Swollen: 0 ; Tender: 0  Joint Exam   No joint exam has been documented for this visit   There is currently no information documented on the homunculus. Go to the Rheumatology activity and complete the homunculus joint exam.  Investigation: No additional findings.  Imaging: No results found.  Recent Labs: Lab Results  Component Value Date   WBC 14.5 (H) 11/09/2018   HGB 14.1 11/09/2018   PLT 407 (H) 11/09/2018   NA 141 11/09/2018   K 4.5 11/09/2018   CL 106 11/09/2018   CO2 26 11/09/2018   GLUCOSE 97 11/09/2018   BUN 22 11/09/2018   CREATININE 0.77 11/09/2018   BILITOT 0.3 11/09/2018   ALKPHOS 59 11/04/2016   AST 20 11/09/2018   ALT 22 11/09/2018   PROT 6.8 11/09/2018   ALBUMIN 4.0 11/04/2016   CALCIUM 10.2 11/09/2018   GFRAA 97 11/09/2018    Speciality Comments: PLQ Eye Exam: 11/09/18 WNL @ [facility] follow up in 12 months  Procedures:  No procedures performed Allergies: Sulfa antibiotics, Sulfa drugs cross reactors, Codeine, Erythromycin, Metronidazole, and Morphine and related      Assessment / Plan:     Visit Diagnoses: Rheumatoid arthritis of multiple sites with negative rheumatoid factor (HCC) -  RF-, CCP-, 14-3-3 eta  positive: She has no synovitis on exam.  She has not had any recent rheumatoid arthritis flares.  She is clinically doing well on Plaquenil 200 mg 1 tablet by mouth twice daily Monday through Friday.  She presents today with severe neck pain, which started 1 month ago.  She has pain with lateral rotation to the left as well as flexion and extension.  She has also been experiencing left-sided radiculopathy.  She was evaluated by her PCP who obtained an x-ray (not in Epic) and prescribed a prednisone taper, which helped temporarily.  Her pain returned about 1 week ago.  We will order an MRI of the C-spine to further assess.  She has no other joint pain or joint swelling at this time.  She will continue taking Plaquenil as prescribed.  A refill Plaquenil was sent to the pharmacy today.  She was advised to notify us if she develops increased joint pain or joint swelling.  She will follow-up in the office in 5 months.  High risk medication use -Plaquenil 200 mg 1 tablet twice daily Monday through Friday.  CBC and CMP were drawn on 11/09/2018.  She will continue to have lab work monitored every 5 months to monitor for drug toxicity.  PLQ Eye Exam: 11/09/18 WNL @ [facility] follow up in 12 months.   Fibromyalgia: Her fibromyalgia pain has been manageable recently.  She has trapezius muscle tension and tenderness bilaterally.  She has chronic pain related to insomnia.   Neck pain: She presents today with neck pain that started 1 month ago after lifting a heavy box.  She was evaluated by her PCP and had x-rays performed as well as a prednisone taper prescribed.  Her pain temporarily resolved while on prednisone.  Her her neck pain returned about 1 week ago.  She is experiencing left-sided radiculopathy and severe discomfort.  She has limited range of motion with lateral rotation to the left as well as flexion and extension.  She has discomfort with compression.  She has been experiencing nocturnal pain.  We  will order an MRI to further assess.  Primary osteoarthritis of both hands: She has PIP and DIP synovial  thickening consistent with osteoarthritis of both hands.  No tenderness or synovitis was noted.  She has complete fist formation bilaterally.  Joint protection and muscle strengthening were discussed.  Primary osteoarthritis of right knee: She has good range of motion with no discomfort at this time.  No warmth or effusion noted.   Trochanteric bursitis of right hip: Resolved   Chronic SI joint pain: She has no SI joint tenderness at this time.   DDD (degenerative disc disease), lumbar: She has no lower back pain currently.   Other fatigue: She has chronic fatigue related to insomnia.    Other medical conditions are listed as follows:   History of positive PPD  History of gastroesophageal reflux (GERD)  History of thyroid nodule  History of depression  History of tics  History of sleep apnea  History of renal calculi  History of diabetes mellitus  History of migraine  History of PCOS    Orders: Orders Placed This Encounter  Procedures  . MR CERVICAL SPINE WO CONTRAST   Meds ordered this encounter  Medications  . hydroxychloroquine (PLAQUENIL) 200 MG tablet    Sig: TAKE 1 TABLET TWICE DAILY --MONDAY THRU FRIDAY FOR RHEUMATOID ARTHRITIS.    Dispense:  120 tablet    Refill:  0     Follow-Up Instructions: Return in 5 months (on 05/22/2019) for Rheumatoid arthritis, Fibromyalgia, Osteoarthritis.   Ofilia Neas, PA-C   I examined and evaluated the patient with Hazel Sams PA.  Patient presented today with severe cervical pain.  She has had x-ray and prednisone taper by her PCP without much relief.  On my examination she was having radiculopathy to her left shoulder blade.  We will schedule MRI to evaluate this further.  She had no synovitis on examination.  Her rheumatoid arthritis seems to be well controlled.  The plan of care was discussed as noted above.   Bo Merino, MD  Note - This record has been created using Editor, commissioning.  Chart creation errors have been sought, but may not always  have been located. Such creation errors do not reflect on  the standard of medical care.

## 2018-12-19 ENCOUNTER — Other Ambulatory Visit: Payer: Self-pay | Admitting: Rheumatology

## 2018-12-20 NOTE — Telephone Encounter (Signed)
Last Visit: 09/22/18 Next Visit: 12/22/18  Okay to refill per Dr. Deveshwar  

## 2018-12-22 ENCOUNTER — Encounter: Payer: Self-pay | Admitting: Rheumatology

## 2018-12-22 ENCOUNTER — Other Ambulatory Visit: Payer: Self-pay

## 2018-12-22 ENCOUNTER — Ambulatory Visit (INDEPENDENT_AMBULATORY_CARE_PROVIDER_SITE_OTHER): Payer: Medicare Other | Admitting: Rheumatology

## 2018-12-22 VITALS — BP 109/69 | HR 95 | Resp 16 | Ht 64.0 in | Wt 160.4 lb

## 2018-12-22 DIAGNOSIS — G8929 Other chronic pain: Secondary | ICD-10-CM

## 2018-12-22 DIAGNOSIS — M7061 Trochanteric bursitis, right hip: Secondary | ICD-10-CM

## 2018-12-22 DIAGNOSIS — Z79899 Other long term (current) drug therapy: Secondary | ICD-10-CM | POA: Diagnosis not present

## 2018-12-22 DIAGNOSIS — Z9289 Personal history of other medical treatment: Secondary | ICD-10-CM

## 2018-12-22 DIAGNOSIS — Z8742 Personal history of other diseases of the female genital tract: Secondary | ICD-10-CM

## 2018-12-22 DIAGNOSIS — Z8669 Personal history of other diseases of the nervous system and sense organs: Secondary | ICD-10-CM

## 2018-12-22 DIAGNOSIS — Z8659 Personal history of other mental and behavioral disorders: Secondary | ICD-10-CM

## 2018-12-22 DIAGNOSIS — Z8639 Personal history of other endocrine, nutritional and metabolic disease: Secondary | ICD-10-CM

## 2018-12-22 DIAGNOSIS — Z87442 Personal history of urinary calculi: Secondary | ICD-10-CM

## 2018-12-22 DIAGNOSIS — M0609 Rheumatoid arthritis without rheumatoid factor, multiple sites: Secondary | ICD-10-CM | POA: Diagnosis not present

## 2018-12-22 DIAGNOSIS — R5383 Other fatigue: Secondary | ICD-10-CM

## 2018-12-22 DIAGNOSIS — M533 Sacrococcygeal disorders, not elsewhere classified: Secondary | ICD-10-CM

## 2018-12-22 DIAGNOSIS — M19042 Primary osteoarthritis, left hand: Secondary | ICD-10-CM

## 2018-12-22 DIAGNOSIS — M797 Fibromyalgia: Secondary | ICD-10-CM | POA: Diagnosis not present

## 2018-12-22 DIAGNOSIS — M51369 Other intervertebral disc degeneration, lumbar region without mention of lumbar back pain or lower extremity pain: Secondary | ICD-10-CM

## 2018-12-22 DIAGNOSIS — Z8719 Personal history of other diseases of the digestive system: Secondary | ICD-10-CM

## 2018-12-22 DIAGNOSIS — M19041 Primary osteoarthritis, right hand: Secondary | ICD-10-CM | POA: Diagnosis not present

## 2018-12-22 DIAGNOSIS — M542 Cervicalgia: Secondary | ICD-10-CM

## 2018-12-22 DIAGNOSIS — M1711 Unilateral primary osteoarthritis, right knee: Secondary | ICD-10-CM

## 2018-12-22 DIAGNOSIS — M5136 Other intervertebral disc degeneration, lumbar region: Secondary | ICD-10-CM

## 2018-12-22 MED ORDER — HYDROXYCHLOROQUINE SULFATE 200 MG PO TABS: ORAL_TABLET | ORAL | 0 refills | Status: DC

## 2018-12-25 ENCOUNTER — Other Ambulatory Visit: Payer: Self-pay | Admitting: Rheumatology

## 2018-12-26 ENCOUNTER — Other Ambulatory Visit: Payer: Self-pay | Admitting: Rheumatology

## 2018-12-27 ENCOUNTER — Telehealth: Payer: Self-pay | Admitting: Rheumatology

## 2018-12-27 DIAGNOSIS — M542 Cervicalgia: Secondary | ICD-10-CM

## 2018-12-27 NOTE — Telephone Encounter (Signed)
Insurance has denied request for C-spine MRI stating it does not meet medical necessity criteria based on the information provided. Advanced imaging maybe appropriate when the patient has completed physical therapy or a physician supervised therapeutic exercise program. Please advise on how you would like to proceed.

## 2018-12-27 NOTE — Telephone Encounter (Signed)
Please notify patient and refer her to physical therapy.  If she has no response to physical therapy or her symptoms get worse then we can get MRI.

## 2018-12-28 ENCOUNTER — Other Ambulatory Visit: Payer: Self-pay | Admitting: Rheumatology

## 2018-12-28 NOTE — Telephone Encounter (Signed)
Attempted to contact patient and left message for patient to call the office.  

## 2018-12-28 NOTE — Addendum Note (Signed)
Addended by: Carole Binning on: 12/28/2018 12:47 PM   Modules accepted: Orders

## 2018-12-28 NOTE — Telephone Encounter (Signed)
Patient advised MRI denied. Patient advised will refer her to physical therapy.  If she has no response to physical therapy or her symptoms get worse then we can get MRI.

## 2018-12-31 ENCOUNTER — Other Ambulatory Visit: Payer: Self-pay | Admitting: Rheumatology

## 2019-01-01 ENCOUNTER — Other Ambulatory Visit: Payer: Self-pay | Admitting: Rheumatology

## 2019-01-16 ENCOUNTER — Encounter (HOSPITAL_COMMUNITY): Payer: Self-pay | Admitting: Physical Therapy

## 2019-01-16 ENCOUNTER — Ambulatory Visit (HOSPITAL_COMMUNITY): Payer: Medicare Other | Attending: Rheumatology | Admitting: Physical Therapy

## 2019-01-16 ENCOUNTER — Other Ambulatory Visit: Payer: Self-pay

## 2019-01-16 DIAGNOSIS — R29898 Other symptoms and signs involving the musculoskeletal system: Secondary | ICD-10-CM | POA: Insufficient documentation

## 2019-01-16 DIAGNOSIS — M542 Cervicalgia: Secondary | ICD-10-CM | POA: Insufficient documentation

## 2019-01-16 NOTE — Patient Instructions (Signed)
Access Code: [email or web address]  URL: [email or web address]  Date: 01/16/2019  Prepared by: Mitzi Hansen Lashanta Elbe   Exercises Supine Chin Tuck - 10 reps - 2 sets - 2 second holds hold - 3x daily - 7x weekly
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
## 2019-01-17 ENCOUNTER — Ambulatory Visit (HOSPITAL_COMMUNITY): Payer: Medicare Other | Admitting: Physical Therapy

## 2019-01-17 DIAGNOSIS — M542 Cervicalgia: Secondary | ICD-10-CM

## 2019-01-17 DIAGNOSIS — R29898 Other symptoms and signs involving the musculoskeletal system: Secondary | ICD-10-CM

## 2019-01-17 NOTE — Patient Instructions (Signed)
Flexibility: Neck Retraction    Pull head straight back, keeping eyes and jaw level. Repeat _5___ times per set. Do ____1 sets per session. Do __3__ sessions per day.  http://orth.exer.us/344   Copyright  VHI. All rights reserved.  Scapular Retraction (Standing)    With arm10__ times per set. Do _1___ sets per session. Do _2___ sessions per day.  http://orth.exer.us/944   Copyright  VHI. All rights reserved.

## 2019-01-17 NOTE — Therapy (Signed)
[facility] [address] [place], [place], [address] Phone: [phone]   Fax:  [phone]  Physical Therapy Treatment  Patient Details  Name: Christina Lewis MRN: [id number] Date of Birth: 27-Dec-1955 Referring Provider (PT): Pollyann Savoy MD   Encounter Date: 01/17/2019  PT End of Session - 01/17/19 1447    Number of Visits  2 of 12    Date for PT Re-Evaluation  02/27/19   mini reassess 02/06/19   Authorization Type  Primary: Medicare Part A and B Secondary: BCBS State Health Plan (no auth required)    Authorization Time Period  01/16/19-02/27/19    Authorization - Visit Number  2   Authorization - Number of Visits  10    PT Start Time  1406    PT Stop Time  1445    PT Time Calculation (min)  39 min    Activity Tolerance  Patient tolerated treatment well    Behavior During Therapy  [facility] for tasks assessed/performed       Past Medical History:  Diagnosis Date  . Arthritis   . Asthma 2007  . Diabetes mellitus   . Fibromyalgia   . Gout   . Hematuria   . History of tics   . Interstitial cystitis 2016  . Migraines   . Osteoarthritis   . PCOS (polycystic ovarian syndrome) 1993  . PONV (postoperative nausea and vomiting)   . Sleep apnea   . 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 Thyroid nodule     Past Surgical History:  Procedure Laterality Date  . ABDOMINAL HYSTERECTOMY  2000  . APPENDECTOMY  1976  . BREAST LUMPECTOMY  1989   lt-negative  . CARDIOVASCULAR STRESS TEST  2000  . CHOLECYSTECTOMY  1985  . COLONOSCOPY    . HYSTERECTOMY ABDOMINAL WITH SALPINGECTOMY Bilateral 2000  . KIDNEY STONE SURGERY Right 2014   with stent placement in OR  . KNEE ARTHROSCOPY Right 08/22/2013   Procedure: ARTHROSCOPY RIGHT KNEE FOR INFECTION LAVAGE AND DRAINAGE;  Surgeon: Thera Flake., MD;  Location: [facility];  Service: Orthopedics;  Laterality: Right;  . KNEE  ARTHROSCOPY WITH PATELLA RECONSTRUCTION Right 08/06/2013   Procedure: RIGHT KNEE ARTHROSCOPY WITH MENISCECTOMY MEDIAL, ARTHROSCOPY KNEE WITH DEBRIDEMENT/SHAVING (CHONDROPLASTY) ;  Surgeon: Thera Flake., MD;  Location: [facility];  Service: Orthopedics;  Laterality: Right;  . NASAL SINUS SURGERY     x4  . OOPHORECTOMY    . SHOULDER SURGERY  2011   right shoulder  . THYROID SURGERY  1994  . TONSILLECTOMY Bilateral 1974    There were no vitals filed for this visit.  Subjective Assessment - 01/17/19 1415    Subjective  Pt states she has no questions over the evaluation or exercises given to her.    Pertinent History  RA, Fibromyalgia, diabetes, chronic headaches    Limitations  Lifting;Sitting;Other (comment);House hold activities;Reading   postures, laundry   How long can you sit comfortably?  20 minutes    How long can you stand comfortably?  1 hour    Patient Stated Goals  decrease neck pain    Currently in Pain?  Yes    Pain Score  1     Pain Location  Neck    Pain Descriptors / Indicators  Aching    Pain Type  Acute pain    Pain Onset  More than a month  ago                       [facility] Treatment/Exercise - 01/17/19 0001      Exercises   Exercises  Neck      Neck Exercises: Seated   Neck Retraction  10 reps    Neck Retraction Limitations  scapular retraction x 10     Other Seated Exercise Supine:   cervical and thoracic excursions x 3   Cervical and scapular retraction x 2;     Manual Therapy   Manual Therapy  Joint mobilization;Soft tissue mobilization;Manual Traction    Manual therapy comments  done seperate from all other aspects of treatment     Joint Mobilization  to imporve ROM     Soft tissue mobilization  to upper trap mm to decrease tension     Manual Traction  15" x 6 to decrease pain              PT Education - 01/17/19 1447    Education Details  advance HEP    Person(s) Educated  Patient    Methods   Explanation;Demonstration;Verbal cues;Handout    Comprehension  Verbalized understanding;Returned demonstration       PT Short Term Goals - 01/17/19 1414      PT SHORT TERM GOAL #1   Title  Patient will be independent with HEP in order to optimze functional outcomes.    Time  3    Period  Weeks    Status  On-going    Target Date  02/06/19        PT Long Term Goals - 01/17/19 1414      PT LONG TERM GOAL #1   Title  Patient will demonstrate at least 10% improvement in cervical ROM in restricted motions in order to demonstrate improved function with ADL,    Time  6    Period  Weeks    Status  On-going      PT LONG TERM GOAL #2   Title  Patient will improve FOTO score by at least 4% in order to demonstrate improved tolerance to activity.    Time  6    Period  Weeks    Status  On-going      PT LONG TERM GOAL #3   Title  Patient will report ability to manage neck pain/headaches with HEP in order to transition to self care.    Time  6    Period  Weeks    Status  On-going      PT LONG TERM GOAL #4   Title  Patient will experience neck pain/headaches no greater than 2/10 in order to improve quality of life.    Time  6    Period  Weeks    Status  On-going            Plan - 01/17/19 1449    Clinical Impression Statement  Evaluation and goals reviewed with patient.  Therapist added mobility and postural exercises to pt HEP, began manual techniques to decrease pain and improve ROM>    Personal Factors and Comorbidities  Comorbidity 3+;Past/Current Experience;Behavior Pattern    Comorbidities  RA, Fibromyalgia, diabetes, chronic headaches    Examination-Activity Limitations  Bed Mobility;Bend;Sleep;Carry;Dressing;Hygiene/Grooming;Lift;Reach Overhead    Examination-Participation Restrictions  Cleaning;Community Activity;Driving;Laundry;Meal Prep;Shop;Volunteer;Yard Work    Stability/Clinical Decision Making  Stable/Uncomplicated    Rehab Potential  Good    PT Frequency  2x  / week  PT Duration  6 weeks    PT Treatment/Interventions  ADLs/Self Care Home Management;Aquatic Therapy;Biofeedback;Electrical Stimulation;Cryotherapy;Iontophoresis 4mg /ml Dexamethasone;Moist Heat;Traction;Ultrasound;Functional mobility training;Therapeutic activities;Therapeutic exercise;Balance training;Neuromuscular re-education;Patient/family education;Manual techniques;Passive range of motion;Dry needling;Energy conservation;Splinting;Taping;Vestibular;Spinal Manipulations;Joint Manipulations    PT Next Visit Plan  Possibly add manual therapy for c/sp. Assess response to initial HEP. Add postural exercises as tolerated    PT Home Exercise Plan  01/16/19: Cervical retractions in supine 2 x 10    Consulted and Agree with Plan of Care  Patient       Patient will benefit from skilled therapeutic intervention in order to improve the following deficits and impairments:  Decreased activity tolerance, Decreased balance, Decreased coordination, Decreased endurance, Decreased mobility, Decreased range of motion, Decreased strength, Difficulty walking, Hypomobility, Increased muscle spasms, Impaired UE functional use, Improper body mechanics, Postural dysfunction, Pain  Visit Diagnosis: Cervicalgia  Other symptoms and signs involving the musculoskeletal system     Problem List Patient Active Problem List   Diagnosis Date Noted  . Leukocytosis 11/04/2016  . Fibromyalgia 02/10/2016  . Other fatigue 02/10/2016  . Primary osteoarthritis of both hands 02/10/2016  . History of migraine 02/10/2016  . History of thyroid nodule 02/10/2016  . History of sleep apnea 02/10/2016  . History of renal calculi 02/10/2016  . Pain in joint, shoulder region 10/02/2010  . Adhesive capsulitis of shoulder 10/02/2010  . Muscle weakness (generalized) 10/02/2010   Rayetta Humphrey, PT CLT [phone] 01/17/2019, 2:51 PM  [place], [place], [address] Phone: [phone]   Fax:  [phone]  Name: CAMREE WIGINGTON MRN: [id number] Date of Birth: 1955-04-02

## 2019-01-22 ENCOUNTER — Telehealth (HOSPITAL_COMMUNITY): Payer: Self-pay | Admitting: Physical Therapy

## 2019-01-22 ENCOUNTER — Ambulatory Visit (HOSPITAL_COMMUNITY): Payer: Medicare Other | Admitting: Physical Therapy

## 2019-01-22 NOTE — Telephone Encounter (Signed)
She has a migrain headache and will not be here today

## 2019-01-24 ENCOUNTER — Other Ambulatory Visit: Payer: Self-pay

## 2019-01-24 ENCOUNTER — Ambulatory Visit (HOSPITAL_COMMUNITY): Payer: Medicare Other | Attending: Rheumatology | Admitting: Physical Therapy

## 2019-01-24 ENCOUNTER — Encounter (HOSPITAL_COMMUNITY): Payer: Self-pay | Admitting: Physical Therapy

## 2019-01-24 DIAGNOSIS — M542 Cervicalgia: Secondary | ICD-10-CM | POA: Diagnosis present

## 2019-01-24 DIAGNOSIS — R29898 Other symptoms and signs involving the musculoskeletal system: Secondary | ICD-10-CM | POA: Diagnosis present

## 2019-01-24 NOTE — Therapy (Signed)
[facility] [address] [place], [place], [address] Phone: [phone]   Fax:  [phone]  Physical Therapy Treatment  Patient Details  Name: Christina Lewis MRN: [id number] Date of Birth: Nov 06, 1955 Referring Provider (PT): Pollyann Savoy MD   Encounter Date: 01/24/2019  PT End of Session - 01/24/19 1501    Visit Number  3    Number of Visits  12    Date for PT Re-Evaluation  02/27/19   mini reassess 02/06/19   Authorization Type  Primary: Medicare Part A and B Secondary: [employer] State [employer] (no auth required)    Authorization Time Period  01/16/19-02/27/19    Authorization - Visit Number  3    Authorization - Number of Visits  10    PT Start Time  1352    PT Stop Time  1436    PT Time Calculation (min)  44 min    Activity Tolerance  Patient tolerated treatment well    Behavior During Therapy  [facility] for tasks assessed/performed       Past Medical History:  Diagnosis Date  . Arthritis   . Asthma 2007  . Diabetes mellitus   . Fibromyalgia   . Gout   . Hematuria   . History of tics   . Interstitial cystitis 2016  . Migraines   . Osteoarthritis   . PCOS (polycystic ovarian syndrome) 1993  . PONV (postoperative nausea and vomiting)   . Sleep apnea   . 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 Thyroid nodule     Past Surgical History:  Procedure Laterality Date  . ABDOMINAL HYSTERECTOMY  2000  . APPENDECTOMY  1976  . BREAST LUMPECTOMY  1989   lt-negative  . CARDIOVASCULAR STRESS TEST  2000  . CHOLECYSTECTOMY  1985  . COLONOSCOPY    . HYSTERECTOMY ABDOMINAL WITH SALPINGECTOMY Bilateral 2000  . KIDNEY STONE SURGERY Right 2014   with stent placement in OR  . KNEE ARTHROSCOPY Right 08/22/2013   Procedure: ARTHROSCOPY RIGHT KNEE FOR INFECTION LAVAGE AND DRAINAGE;  Surgeon: Thera Flake., MD;  Location: [facility];  Service: Orthopedics;  Laterality: Right;   . KNEE ARTHROSCOPY WITH PATELLA RECONSTRUCTION Right 08/06/2013   Procedure: RIGHT KNEE ARTHROSCOPY WITH MENISCECTOMY MEDIAL, ARTHROSCOPY KNEE WITH DEBRIDEMENT/SHAVING (CHONDROPLASTY) ;  Surgeon: Thera Flake., MD;  Location: [facility];  Service: Orthopedics;  Laterality: Right;  . NASAL SINUS SURGERY     x4  . OOPHORECTOMY    . SHOULDER SURGERY  2011   right shoulder  . THYROID SURGERY  1994  . TONSILLECTOMY Bilateral 1974    There were no vitals filed for this visit.  Subjective Assessment - 01/24/19 1354    Subjective  Patient states she had a migraine so she missed last appointment. Retractions are still a little painful. The exercises she was given last session were ok and didn't seem to cause pain. Migraines have been worse with weather lately.    Pertinent History  RA, Fibromyalgia, diabetes, chronic headaches    Limitations  Lifting;Sitting;Other (comment);House hold activities;Reading   postures, laundry   How long can you sit comfortably?  20 minutes    How long can you stand comfortably?  1 hour    Patient Stated Goals  decrease neck pain    Currently in Pain?  Yes    Pain Score  3  Pain Onset  More than a month ago                       [facility] Adult PT Treatment/Exercise - 01/24/19 0001      Neck Exercises: Theraband   Scapula Retraction  Green;10 reps    Scapula Retraction Limitations  2 sets      Neck Exercises: Seated   Neck Retraction  10 reps    Neck Retraction Limitations  scapular retraction x 10     Other Seated Exercise  t/sp ext over chair 1x10       Neck Exercises: Supine   Neck Retraction  10 reps    Neck Retraction Limitations  3 sets x 10       Manual Therapy   Manual Therapy  Joint mobilization;Soft tissue mobilization;Manual Traction    Manual therapy comments  done seperate from all other aspects of treatment     Joint Mobilization  Grade II-III L UPA C3-C7    Soft tissue mobilization  L Upper trap,  bilateral sub occipitals, L cervical paraspinals, self STM with cane to upper trap    Manual Traction  15" x 4 to decrease pain              PT Education - 01/24/19 1455    Education Details  Patient educated on HEP, possible mechanisms for headaches, possibily to be sore after mobilizations, monitoring symptoms    Person(s) Educated  Patient    Methods  Explanation;Handout    Comprehension  Verbalized understanding       PT Short Term Goals - 01/17/19 1414      PT SHORT TERM GOAL #1   Title  Patient will be independent with HEP in order to optimze functional outcomes.    Time  3    Period  Weeks    Status  On-going    Target Date  02/06/19        PT Long Term Goals - 01/17/19 1414      PT LONG TERM GOAL #1   Title  Patient will demonstrate at least 10% improvement in cervical ROM in restricted motions in order to demonstrate improved function with ADL,    Time  6    Period  Weeks    Status  On-going      PT LONG TERM GOAL #2   Title  Patient will improve FOTO score by at least 4% in order to demonstrate improved tolerance to activity.    Time  6    Period  Weeks    Status  On-going      PT LONG TERM GOAL #3   Title  Patient will report ability to manage neck pain/headaches with HEP in order to transition to self care.    Time  6    Period  Weeks    Status  On-going      PT LONG TERM GOAL #4   Title  Patient will experience neck pain/headaches no greater than 2/10 in order to improve quality of life.    Time  6    Period  Weeks    Status  On-going            Plan - 01/24/19 1503    Clinical Impression Statement  Patient experiences elimination of headache following manual therapy targeted at L cervical spine. She is able to complete cervical retractions without symptoms following verbal cueing for less intense muscle contraction. She tolerates all  other exercises well. Patient educated on monitoring symptoms with activity/exercise. Patient will  continue to benefit from skilled physical therapy in order to improve function and reduce impairment.    Personal Factors and Comorbidities  Comorbidity 3+;Past/Current Experience;Behavior Pattern    Comorbidities  RA, Fibromyalgia, diabetes, chronic headaches    Examination-Activity Limitations  Bed Mobility;Bend;Sleep;Carry;Dressing;Hygiene/Grooming;Lift;Reach Overhead    Examination-Participation Restrictions  Cleaning;Community Activity;Driving;Laundry;Meal Prep;Shop;Volunteer;Yard Work    Stability/Clinical Decision Making  Stable/Uncomplicated    Rehab Potential  Good    PT Frequency  2x / week    PT Duration  6 weeks    PT Treatment/Interventions  ADLs/Self Care Home Management;Aquatic Therapy;Biofeedback;Electrical Stimulation;Cryotherapy;Iontophoresis 4mg /ml Dexamethasone;Moist Heat;Traction;Ultrasound;Functional mobility training;Therapeutic activities;Therapeutic exercise;Balance training;Neuromuscular re-education;Patient/family education;Manual techniques;Passive range of motion;Dry needling;Energy conservation;Splinting;Taping;Vestibular;Spinal Manipulations;Joint Manipulations    PT Next Visit Plan  continue manual therapy for c/sp.  Add postural exercises as tolerated    PT Home Exercise Plan  01/16/19: Cervical retractions in supine 2 x 10; 01/24/19: thoracic ext over chair x 10, self STM with cane, Rows green band 2x10    Consulted and Agree with Plan of Care  Patient       Patient will benefit from skilled therapeutic intervention in order to improve the following deficits and impairments:  Decreased activity tolerance, Decreased balance, Decreased coordination, Decreased endurance, Decreased mobility, Decreased range of motion, Decreased strength, Difficulty walking, Hypomobility, Increased muscle spasms, Impaired UE functional use, Improper body mechanics, Postural dysfunction, Pain  Visit Diagnosis: Cervicalgia  Other symptoms and signs involving the musculoskeletal  system     Problem List Patient Active Problem List   Diagnosis Date Noted  . Leukocytosis 11/04/2016  . Fibromyalgia 02/10/2016  . Other fatigue 02/10/2016  . Primary osteoarthritis of both hands 02/10/2016  . History of migraine 02/10/2016  . History of thyroid nodule 02/10/2016  . History of sleep apnea 02/10/2016  . History of renal calculi 02/10/2016  . Pain in joint, shoulder region 10/02/2010  . Adhesive capsulitis of shoulder 10/02/2010  . Muscle weakness (generalized) 10/02/2010    3:13 PM, 01/24/19 Mearl Latin PT, DPT Physical Therapist at [place], [place], [address] Phone: [phone]   Fax:  [phone]  Name: Christina Lewis MRN: [id number] Date of Birth: May 21, 1955

## 2019-01-24 NOTE — Patient Instructions (Signed)
Access Code: [email or web address]  URL: [email or web address]  Date: 01/24/2019  Prepared by: [facility] Garnette Greb   Exercises Standing Bilateral Low Shoulder Row with Anchored Resistance - 10 reps - 2 sets - 1x daily - 7x weekly Seated Thoracic Lumbar Extension - 10 reps - 1 sets - 3-5 second hold hold - 1x daily - 7x weekly

## 2019-01-29 ENCOUNTER — Encounter (HOSPITAL_COMMUNITY): Payer: Self-pay | Admitting: Physical Therapy

## 2019-01-29 ENCOUNTER — Other Ambulatory Visit: Payer: Self-pay

## 2019-01-29 ENCOUNTER — Ambulatory Visit (HOSPITAL_COMMUNITY): Payer: Medicare Other | Admitting: Physical Therapy

## 2019-01-29 ENCOUNTER — Other Ambulatory Visit: Payer: Self-pay | Admitting: Rheumatology

## 2019-01-29 DIAGNOSIS — R29898 Other symptoms and signs involving the musculoskeletal system: Secondary | ICD-10-CM

## 2019-01-29 DIAGNOSIS — M542 Cervicalgia: Secondary | ICD-10-CM | POA: Diagnosis not present

## 2019-01-29 NOTE — Therapy (Signed)
[facility] [address] [place], [place], [address] Phone: [phone]   Fax:  [phone]  Physical Therapy Treatment  Patient Details  Name: Christina Lewis MRN: [id number] Date of Birth: 13-Dec-1955 Referring Provider (PT): Pollyann Savoy MD   Encounter Date: 01/29/2019  PT End of Session - 01/29/19 1435    Visit Number  4    Number of Visits  12    Date for PT Re-Evaluation  02/27/19   mini reassess 02/06/19   Authorization Type  Primary: Medicare Part A and B Secondary: [employer] State Health Plan (no auth required)    Authorization Time Period  01/16/19-02/27/19    Authorization - Visit Number  4    Authorization - Number of Visits  10    PT Start Time  1348    PT Stop Time  1428    PT Time Calculation (min)  40 min    Activity Tolerance  Patient tolerated treatment well    Behavior During Therapy  [facility] for tasks assessed/performed       Past Medical History:  Diagnosis Date  . Arthritis   . Asthma 2007  . Diabetes mellitus   . Fibromyalgia   . Gout   . Hematuria   . History of tics   . Interstitial cystitis 2016  . Migraines   . Osteoarthritis   . PCOS (polycystic ovarian syndrome) 1993  . PONV (postoperative nausea and vomiting)   . Sleep apnea   . 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 Thyroid nodule     Past Surgical History:  Procedure Laterality Date  . ABDOMINAL HYSTERECTOMY  2000  . APPENDECTOMY  1976  . BREAST LUMPECTOMY  1989   lt-negative  . CARDIOVASCULAR STRESS TEST  2000  . CHOLECYSTECTOMY  1985  . COLONOSCOPY    . HYSTERECTOMY ABDOMINAL WITH SALPINGECTOMY Bilateral 2000  . KIDNEY STONE SURGERY Right 2014   with stent placement in OR  . KNEE ARTHROSCOPY Right 08/22/2013   Procedure: ARTHROSCOPY RIGHT KNEE FOR INFECTION LAVAGE AND DRAINAGE;  Surgeon: Thera Flake., MD;  Location: [facility];  Service: Orthopedics;  Laterality: Right;   . KNEE ARTHROSCOPY WITH PATELLA RECONSTRUCTION Right 08/06/2013   Procedure: RIGHT KNEE ARTHROSCOPY WITH MENISCECTOMY MEDIAL, ARTHROSCOPY KNEE WITH DEBRIDEMENT/SHAVING (CHONDROPLASTY) ;  Surgeon: Thera Flake., MD;  Location: [facility];  Service: Orthopedics;  Laterality: Right;  . NASAL SINUS SURGERY     x4  . OOPHORECTOMY    . SHOULDER SURGERY  2011   right shoulder  . THYROID SURGERY  1994  . TONSILLECTOMY Bilateral 1974    There were no vitals filed for this visit.  Subjective Assessment - 01/29/19 1348    Subjective  Patient states she has not had any headaches over the weekend and only mild neck pain. She has not been able to do her exercises as much because she didn't feel good due to tooth problem. She has been doing her exercises several times a day.    Pertinent History  RA, Fibromyalgia, diabetes, chronic headaches    Limitations  Lifting;Sitting;Other (comment);House hold activities;Reading   postures, laundry   How long can you sit comfortably?  20 minutes    How long can you stand comfortably?  1 hour    Patient Stated Goals  decrease neck pain    Currently in Pain?  No/denies    Pain  Onset  More than a month ago                       [facility] Adult PT Treatment/Exercise - 01/29/19 0001      Neck Exercises: Standing   Other Standing Exercises  horizontal abduction with red band 2x10      Neck Exercises: Supine   Neck Retraction  10 reps    Neck Retraction Limitations  3 sets x 10     Other Supine Exercise  neck flexor endurance 10 x 10 second holds      Neck Exercises: Prone   Other Prone Exercise  cat/cow in quadruped 2x10      Manual Therapy   Manual Therapy  Joint mobilization;Soft tissue mobilization;Manual Traction    Manual therapy comments  done seperate from all other aspects of treatment     Joint Mobilization  Grade II-III L UPA C3-C7    Soft tissue mobilization  L Upper trap, bilateral sub occipitals, L cervical  paraspinals, self STM with cane to upper trap    Manual Traction  15" x 4 to decrease pain              PT Education - 01/29/19 1434    Education Details  Patient educated on HEP, muscle soreness following exercise possibly    Person(s) Educated  Patient    Methods  Explanation;Handout    Comprehension  Verbalized understanding       PT Short Term Goals - 01/17/19 1414      PT SHORT TERM GOAL #1   Title  Patient will be independent with HEP in order to optimze functional outcomes.    Time  3    Period  Weeks    Status  On-going    Target Date  02/06/19        PT Long Term Goals - 01/17/19 1414      PT LONG TERM GOAL #1   Title  Patient will demonstrate at least 10% improvement in cervical ROM in restricted motions in order to demonstrate improved function with ADL,    Time  6    Period  Weeks    Status  On-going      PT LONG TERM GOAL #2   Title  Patient will improve FOTO score by at least 4% in order to demonstrate improved tolerance to activity.    Time  6    Period  Weeks    Status  On-going      PT LONG TERM GOAL #3   Title  Patient will report ability to manage neck pain/headaches with HEP in order to transition to self care.    Time  6    Period  Weeks    Status  On-going      PT LONG TERM GOAL #4   Title  Patient will experience neck pain/headaches no greater than 2/10 in order to improve quality of life.    Time  6    Period  Weeks    Status  On-going            Plan - 01/29/19 1436    Clinical Impression Statement  Patient has had no headache symptoms since it was eliminated following manual therapy last session. She has had minimal increase in cervical symptoms since. She demonstrates impaired motor control during cat/cow exercise and requires frequent verbal and tactile cueing to be able to complete. She fatigues quickly with neck flexor endurance exercise  and compensates quickly with SCM to be able to complete. Patient will continue to  benefit from skilled physical therapy in order to improve function and reduce impairment.    Personal Factors and Comorbidities  Comorbidity 3+;Past/Current Experience;Behavior Pattern    Comorbidities  RA, Fibromyalgia, diabetes, chronic headaches    Examination-Activity Limitations  Bed Mobility;Bend;Sleep;Carry;Dressing;Hygiene/Grooming;Lift;Reach Overhead    Examination-Participation Restrictions  Cleaning;Community Activity;Driving;Laundry;Meal Prep;Shop;Volunteer;Yard Work    Stability/Clinical Decision Making  Stable/Uncomplicated    Rehab Potential  Good    PT Frequency  2x / week    PT Duration  6 weeks    PT Treatment/Interventions  ADLs/Self Care Home Management;Aquatic Therapy;Biofeedback;Electrical Stimulation;Cryotherapy;Iontophoresis 4mg /ml Dexamethasone;Moist Heat;Traction;Ultrasound;Functional mobility training;Therapeutic activities;Therapeutic exercise;Balance training;Neuromuscular re-education;Patient/family education;Manual techniques;Passive range of motion;Dry needling;Energy conservation;Splinting;Taping;Vestibular;Spinal Manipulations;Joint Manipulations    PT Next Visit Plan  continue manual therapy for c/sp.  Add postural exercises as tolerated    PT Home Exercise Plan  01/16/19: Cervical retractions in supine 2 x 10; 01/24/19: thoracic ext over chair x 10, self STM with cane, Rows green band 2x10 01/29/19: horizontal abduction red band 2x10    Consulted and Agree with Plan of Care  Patient       Patient will benefit from skilled therapeutic intervention in order to improve the following deficits and impairments:  Decreased activity tolerance, Decreased balance, Decreased coordination, Decreased endurance, Decreased mobility, Decreased range of motion, Decreased strength, Difficulty walking, Hypomobility, Increased muscle spasms, Impaired UE functional use, Improper body mechanics, Postural dysfunction, Pain  Visit Diagnosis: Cervicalgia  Other symptoms and signs  involving the musculoskeletal system     Problem List Patient Active Problem List   Diagnosis Date Noted  . Leukocytosis 11/04/2016  . Fibromyalgia 02/10/2016  . Other fatigue 02/10/2016  . Primary osteoarthritis of both hands 02/10/2016  . History of migraine 02/10/2016  . History of thyroid nodule 02/10/2016  . History of sleep apnea 02/10/2016  . History of renal calculi 02/10/2016  . Pain in joint, shoulder region 10/02/2010  . Adhesive capsulitis of shoulder 10/02/2010  . Muscle weakness (generalized) 10/02/2010    2:42 PM, 01/29/19 Mearl Latin PT, DPT Physical Therapist at [place], [place], [address] Phone: [phone]   Fax:  [phone]  Name: CAYSIE MINNIFIELD MRN: [id number] Date of Birth: 06/21/1955

## 2019-01-29 NOTE — Telephone Encounter (Signed)
Last Visit: 12/22/2018 Next Visit: 05/18/2019  Okay to refill per Dr. Deveshwar.  

## 2019-01-29 NOTE — Patient Instructions (Signed)
Access Code: [email or web address]  URL: [email or web address]  Date: 01/29/2019  Prepared by: [facility]ys Cog[place]   Exercises Standing Shoulder Horizontal Abduction with Resistance - 10 reps - 2 sets - 1x daily - 7x weekly

## 2019-01-31 ENCOUNTER — Ambulatory Visit (HOSPITAL_COMMUNITY): Payer: Medicare Other | Admitting: Physical Therapy

## 2019-02-01 ENCOUNTER — Other Ambulatory Visit: Payer: Self-pay | Admitting: Rheumatology

## 2019-02-02 NOTE — Telephone Encounter (Signed)
Last Visit: 12/22/2018 Next Visit: 05/18/2019  Okay to refill per Dr. Deveshwar.  

## 2019-02-05 ENCOUNTER — Ambulatory Visit (HOSPITAL_COMMUNITY): Payer: Medicare Other | Admitting: Physical Therapy

## 2019-02-05 ENCOUNTER — Telehealth (HOSPITAL_COMMUNITY): Payer: Self-pay | Admitting: Physical Therapy

## 2019-02-05 NOTE — Telephone Encounter (Signed)
pt called to cancel due to she is not feeling well

## 2019-02-07 ENCOUNTER — Telehealth (HOSPITAL_COMMUNITY): Payer: Self-pay | Admitting: Physical Therapy

## 2019-02-07 ENCOUNTER — Ambulatory Visit (HOSPITAL_COMMUNITY): Payer: Medicare Other | Admitting: Physical Therapy

## 2019-02-07 NOTE — Telephone Encounter (Signed)
pt called to cancel today's appt due to she has a toothache that she thought was abcessed so she is going to the dentist

## 2019-02-12 ENCOUNTER — Ambulatory Visit (HOSPITAL_COMMUNITY): Payer: Medicare Other | Admitting: Physical Therapy

## 2019-02-12 ENCOUNTER — Encounter (HOSPITAL_COMMUNITY): Payer: Self-pay | Admitting: Physical Therapy

## 2019-02-12 ENCOUNTER — Other Ambulatory Visit: Payer: Self-pay

## 2019-02-12 DIAGNOSIS — M542 Cervicalgia: Secondary | ICD-10-CM | POA: Diagnosis not present

## 2019-02-12 DIAGNOSIS — R29898 Other symptoms and signs involving the musculoskeletal system: Secondary | ICD-10-CM

## 2019-02-12 NOTE — Therapy (Signed)
[place], [place], [address] Phone: [phone]   Fax:  [phone]  Physical Therapy Treatment  Patient Details  Name: Christina Lewis MRN: [id number] Date of Birth: 12-29-1955 Referring Provider (PT): Bo Merino MD   Encounter Date: 02/12/2019  PT End of Session - 02/12/19 1520    Visit Number  5    Number of Visits  12    Date for PT Re-Evaluation  02/27/19   mini reassess 02/06/19   Authorization Type  Primary: Medicare Part A and B Secondary: [place] (no auth required)    Authorization Time Period  01/16/19-02/27/19    Authorization - Visit Number  5    Authorization - Number of Visits  10    PT Start Time  1512    PT Stop Time  1557    PT Time Calculation (min)  45 min    Activity Tolerance  Patient tolerated treatment well    Behavior During Therapy   [facility] for tasks assessed/performed       Past Medical History:  Diagnosis Date  . Arthritis   . Asthma 2007  . Diabetes mellitus   . Fibromyalgia   . Gout   . Hematuria   . History of tics   . Interstitial cystitis 2016  . Migraines   . Osteoarthritis   . PCOS (polycystic ovarian syndrome) 1993  . PONV (postoperative nausea and vomiting)   . Sleep apnea   . 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 Thyroid nodule     Past Surgical History:  Procedure Laterality Date  . ABDOMINAL HYSTERECTOMY  2000  . APPENDECTOMY  1976  . BREAST LUMPECTOMY  1989   lt-negative  . CARDIOVASCULAR STRESS TEST  2000  . CHOLECYSTECTOMY  1985  . COLONOSCOPY    . HYSTERECTOMY ABDOMINAL WITH SALPINGECTOMY Bilateral 2000  . KIDNEY STONE SURGERY Right 2014   with stent placement in OR  . KNEE ARTHROSCOPY Right 08/22/2013   Procedure: ARTHROSCOPY RIGHT KNEE FOR INFECTION LAVAGE AND DRAINAGE;  Surgeon: Yvette Rack., MD;  Location: [facility];  Service: Orthopedics;  Laterality: Right;   . KNEE ARTHROSCOPY WITH PATELLA RECONSTRUCTION Right 08/06/2013   Procedure: RIGHT KNEE ARTHROSCOPY WITH MENISCECTOMY MEDIAL, ARTHROSCOPY KNEE WITH DEBRIDEMENT/SHAVING (CHONDROPLASTY) ;  Surgeon: Yvette Rack., MD;  Location: [facility];  Service: Orthopedics;  Laterality: Right;  . NASAL SINUS SURGERY     x4  . OOPHORECTOMY    . SHOULDER SURGERY  2011   right shoulder  . THYROID SURGERY  1994  . TONSILLECTOMY Bilateral 1974    There were no vitals filed for this visit.  Subjective Assessment - 02/12/19 1509    Subjective  Patient reports she has a migraine today. She has had less headaches/migraines lately maybe 2-3x since last session. Her exercises have been going alright but she has not been doing them that much because of her migraine that began Friday night.    Pertinent History  RA, Fibromyalgia, diabetes, chronic headaches    Limitations  Lifting;Sitting;Other (comment);House hold activities;Reading   postures, laundry   How long can you sit comfortably?  20 minutes    How long can you stand comfortably?  1 hour    Patient Stated Goals  decrease neck pain    Currently in Pain?  Yes    Pain Score  4  Pain Onset  More than a month ago                       [facility] Adult PT Treatment/Exercise - 02/12/19 0001      Neck Exercises: Supine   Neck Retraction  10 reps    Neck Retraction Limitations  3 sets x 10       Manual Therapy   Manual Therapy  Joint mobilization;Soft tissue mobilization;Manual Traction    Manual therapy comments  done seperate from all other aspects of treatment     Joint Mobilization  Grade II-III L and R UPA C3-C7; manual cervical retraction with overpressure 2 x 10    Soft tissue mobilization  L Upper trap, bilateral sub occipitals, L and R cervical paraspinals   Manual Traction  15" x 4 to decrease pain              PT Education - 02/12/19 1559    Education Details  Patient educated on importance of HEP,  completion of HEP, and resuming exercises as tolerated as well as monitoring symptoms. Educated patient on importance of frequency of sessions    Person(s) Educated  Patient    Methods  Explanation    Comprehension  Verbalized understanding       PT Short Term Goals - 01/17/19 1414      PT SHORT TERM GOAL #1   Title  Patient will be independent with HEP in order to optimze functional outcomes.    Time  3    Period  Weeks    Status  On-going    Target Date  02/06/19        PT Long Term Goals - 01/17/19 1414      PT LONG TERM GOAL #1   Title  Patient will demonstrate at least 10% improvement in cervical ROM in restricted motions in order to demonstrate improved function with ADL,    Time  6    Period  Weeks    Status  On-going      PT LONG TERM GOAL #2   Title  Patient will improve FOTO score by at least 4% in order to demonstrate improved tolerance to activity.    Time  6    Period  Weeks    Status  On-going      PT LONG TERM GOAL #3   Title  Patient will report ability to manage neck pain/headaches with HEP in order to transition to self care.    Time  6    Period  Weeks    Status  On-going      PT LONG TERM GOAL #4   Title  Patient will experience neck pain/headaches no greater than 2/10 in order to improve quality of life.    Time  6    Period  Weeks    Status  On-going            Plan - 02/12/19 1521    Clinical Impression Statement  Patient has met 0/1 short term goals and 0/4 long term goals at this time due to non-compliance with HEP and continued decreases in function and ability to manage symptoms. She continues to have difficulty completing cervical retractions in supine secondary to performing with too intense of a muscle contraction which improves following verbal cueing. Patient continues to have hypomobile cervical spine along with muscular restrictions/ increased tension. Patient has decrease in cervical spine tissue tension following manual therapy.  She has  elimination in migraine symptoms following manual cervical retractions. Patient educated on importance of frequency of sessions and completing HEP. Patient will continue to benefit from skilled physical therapy in order to improve function and reduce impairment.    Personal Factors and Comorbidities  Comorbidity 3+;Past/Current Experience;Behavior Pattern    Comorbidities  RA, Fibromyalgia, diabetes, chronic headaches    Examination-Activity Limitations  Bed Mobility;Bend;Sleep;Carry;Dressing;Hygiene/Grooming;Lift;Reach Overhead    Examination-Participation Restrictions  Cleaning;Community Activity;Driving;Laundry;Meal Prep;Shop;Volunteer;Yard Work    [employer]  --    Rehab Potential  Good    PT Frequency  2x / week    PT Duration  6 weeks    PT Treatment/Interventions  ADLs/Self Care Home Management;Aquatic Therapy;Biofeedback;Electrical Stimulation;Cryotherapy;Iontophoresis 76m/ml Dexamethasone;Moist Heat;Traction;Ultrasound;Functional mobility training;Therapeutic activities;Therapeutic exercise;Balance training;Neuromuscular re-education;Patient/family education;Manual techniques;Passive range of motion;Dry needling;Energy conservation;Splinting;Taping;Vestibular;Spinal Manipulations;Joint Manipulations    PT Next Visit Plan  continue manual therapy for c/sp.  Add postural exercises as tolerated    PT Home Exercise Plan  01/16/19: Cervical retractions in supine 2 x 10; 01/24/19: thoracic ext over chair x 10, self STM with cane, Rows green band 2x10 01/29/19: horizontal abduction red band 2x10    Consulted and Agree with Plan of Care  Patient       Patient will benefit from skilled therapeutic intervention in order to improve the following deficits and impairments:  Decreased activity tolerance, Decreased balance, Decreased coordination, Decreased endurance, Decreased mobility, Decreased range of motion, Decreased strength, Difficulty walking, Hypomobility, Increased  muscle spasms, Impaired UE functional use, Improper body mechanics, Postural dysfunction, Pain  Visit Diagnosis: Cervicalgia  Other symptoms and signs involving the musculoskeletal system     Problem List Patient Active Problem List   Diagnosis Date Noted  . Leukocytosis 11/04/2016  . Fibromyalgia 02/10/2016  . Other fatigue 02/10/2016  . Primary osteoarthritis of both hands 02/10/2016  . History of migraine 02/10/2016  . History of thyroid nodule 02/10/2016  . History of sleep apnea 02/10/2016  . History of renal calculi 02/10/2016  . Pain in joint, shoulder region 10/02/2010  . Adhesive capsulitis of shoulder 10/02/2010  . Muscle weakness (generalized) 10/02/2010    4:17 PM, 02/12/19 AMearl LatinPT, DPT [place] [address]Phone: [phone]  Fax:  [phone] Name: TSHARNELL KNIGHTMRN: [id number] of Birth: [birth year]

## 2019-02-14 ENCOUNTER — Encounter (HOSPITAL_COMMUNITY): Payer: BC Managed Care – PPO | Admitting: Physical Therapy

## 2019-02-15 ENCOUNTER — Encounter (HOSPITAL_COMMUNITY): Payer: Self-pay | Admitting: Physical Therapy

## 2019-02-15 ENCOUNTER — Ambulatory Visit (HOSPITAL_COMMUNITY): Payer: Medicare Other | Admitting: Physical Therapy

## 2019-02-15 ENCOUNTER — Other Ambulatory Visit: Payer: Self-pay

## 2019-02-15 DIAGNOSIS — R29898 Other symptoms and signs involving the musculoskeletal system: Secondary | ICD-10-CM

## 2019-02-15 DIAGNOSIS — M542 Cervicalgia: Secondary | ICD-10-CM

## 2019-02-15 NOTE — Therapy (Signed)
[place], [place], [address] Phone: [phone]   Fax:  [phone]  Physical Therapy Treatment  Patient Details  Name: Christina Lewis MRN: [id number] Date of Birth: [birth year] Referring Provider (PT): Bo Merino MD   Encounter Date: 02/15/2019  PT End of Session - 02/15/19 1401    Visit Number  6    Number of Visits  12    Date for PT Re-Evaluation  02/27/19   mini reassess 02/06/19   Authorization Type  Primary: Medicare Part A and B Secondary: [place] (no auth required)    Authorization Time Period  01/16/19-02/27/19    Authorization - Visit Number  6    Authorization - Number of Visits  10    PT Start Time  1130    PT Stop Time  1213    PT Time Calculation (min)  43 min    Activity Tolerance  Patient tolerated treatment well    Behavior During Therapy  [facility] for tasks assessed/performed       Past Medical History:  Diagnosis Date  . Arthritis   . Asthma 2007  . Diabetes mellitus   . Fibromyalgia   . Gout   . Hematuria   . History of tics   . Interstitial cystitis 2016  . Migraines   . Osteoarthritis   . PCOS (polycystic ovarian syndrome) 1993  . PONV (postoperative nausea and vomiting)   . Sleep apnea   . 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 Thyroid nodule     Past Surgical History:  Procedure Laterality Date  . ABDOMINAL HYSTERECTOMY  2000  . APPENDECTOMY  1976  . BREAST LUMPECTOMY  1989   lt-negative  . CARDIOVASCULAR STRESS TEST  2000  . CHOLECYSTECTOMY  1985  . COLONOSCOPY    . HYSTERECTOMY ABDOMINAL WITH SALPINGECTOMY Bilateral 2000  . KIDNEY STONE SURGERY Right 2014   with stent placement in OR  . KNEE ARTHROSCOPY Right 08/22/2013   Procedure: ARTHROSCOPY RIGHT KNEE FOR INFECTION LAVAGE AND DRAINAGE;  Surgeon: Yvette Rack., MD;  Location: [facility];  Service: Orthopedics;  Laterality: Right;   . KNEE ARTHROSCOPY WITH PATELLA RECONSTRUCTION Right 08/06/2013   Procedure: RIGHT KNEE ARTHROSCOPY WITH MENISCECTOMY MEDIAL, ARTHROSCOPY KNEE WITH DEBRIDEMENT/SHAVING (CHONDROPLASTY) ;  Surgeon: Yvette Rack., MD;  Location: [facility];  Service: Orthopedics;  Laterality: Right;  . NASAL SINUS SURGERY     x4  . OOPHORECTOMY    . SHOULDER SURGERY  2011   right shoulder  . THYROID SURGERY  1994  . TONSILLECTOMY Bilateral 1974    There were no vitals filed for this visit.  Subjective Assessment - 02/15/19 1134    Subjective  Pt states that she has a little tenderness in the left neck going into her shoulder blade.She  Pushed a cart with dog food into a hole and had a jolt    Pertinent History  RA, Fibromyalgia, diabetes, chronic headaches    Limitations  Lifting;Sitting;Other (comment);House hold activities;Reading   postures, laundry   How long can you sit comfortably?  20 minutes    How long can you stand comfortably?  1 hour    Patient Stated Goals  decrease neck pain    Currently in Pain?  Yes    Pain Score  2     Pain Location  Shoulder    Pain  Orientation  Left    Pain Descriptors / Indicators  Aching;Dull    Pain Type  Chronic pain    Pain Onset  More than a month ago                       [facility] Adult PT Treatment/Exercise - 02/15/19 0001      Neck Exercises: Seated   Cervical Isometrics  Extension;Right lateral flexion;Left lateral flexion;5 reps    Neck Retraction  10 reps    Cervical Rotation  5 reps    Lateral Flexion  Both;5 reps    W Back  5 reps    Shoulder Rolls  Backwards;5 reps      Neck Exercises: Supine   Neck Retraction  10 reps    Other Supine Exercise  scapular retraction x 10       Manual Therapy   Manual Therapy  Joint mobilization;Soft tissue mobilization;Manual Traction    Manual therapy comments  done seperate from all other aspects of treatment     Joint Mobilization  Thoracic Grade II done prone cervical done  supine Grade II-III L UPA C3-C7; manual cervical retraction with overpressure 2 x 10    Soft tissue mobilization  L Upper trap, bilateral sub occipitals, L cervical paraspinals, self STM with cane to upper trap    Manual Traction  30" x 4 to decrease pain                PT Short Term Goals - 01/17/19 1414      PT SHORT TERM GOAL #1   Title  Patient will be independent with HEP in order to optimze functional outcomes.    Time  3    Period  Weeks    Status  On-going    Target Date  02/06/19        PT Long Term Goals - 01/17/19 1414      PT LONG TERM GOAL #1   Title  Patient will demonstrate at least 10% improvement in cervical ROM in restricted motions in order to demonstrate improved function with ADL,    Time  6    Period  Weeks    Status  On-going      PT LONG TERM GOAL #2   Title  Patient will improve FOTO score by at least 4% in order to demonstrate improved tolerance to activity.    Time  6    Period  Weeks    Status  On-going      PT LONG TERM GOAL #3   Title  Patient will report ability to manage neck pain/headaches with HEP in order to transition to self care.    Time  6    Period  Weeks    Status  On-going      PT LONG TERM GOAL #4   Title  Patient will experience neck pain/headaches no greater than 2/10 in order to improve quality of life.    Time  6    Period  Weeks    Status  On-going            Plan - 02/15/19 1401    Clinical Impression Statement  Therapist instructed pt in isometric exercises to improve cervical stability. Manual completed to decrease pain with  minimal mm spasm palpatable in trap area.  Pt painfree at the end of manual today.    Personal Factors and Comorbidities  Comorbidity 3+;Past/Current Experience;Behavior Pattern    Comorbidities  RA, Fibromyalgia, diabetes, chronic headaches    Examination-Activity Limitations  Bed Mobility;Bend;Sleep;Carry;Dressing;Hygiene/Grooming;Lift;Reach Overhead    Examination-Participation  Restrictions  Cleaning;Community Activity;Driving;Laundry;Meal Prep;Shop;Volunteer;Yard Work    Rehab Potential  Good    PT Frequency  2x / week    PT Duration  6 weeks    PT Treatment/Interventions  ADLs/Self Care Home Management;Aquatic Therapy;Biofeedback;Electrical Stimulation;Cryotherapy;Iontophoresis 4mg /ml Dexamethasone;Moist Heat;Traction;Ultrasound;Functional mobility training;Therapeutic activities;Therapeutic exercise;Balance training;Neuromuscular re-education;Patient/family education;Manual techniques;Passive range of motion;Dry needling;Energy conservation;Splinting;Taping;Vestibular;Spinal Manipulations;Joint Manipulations    PT Next Visit Plan  Give pt isometric exercises for HEP    PT Home Exercise Plan  01/16/19: Cervical retractions in supine 2 x 10; 01/24/19: thoracic ext over chair x 10, self STM with cane, Rows green band 2x10 01/29/19: horizontal abduction red band 2x10    Consulted and Agree with Plan of Care  Patient       Patient will benefit from skilled therapeutic intervention in order to improve the following deficits and impairments:  Decreased activity tolerance, Decreased balance, Decreased coordination, Decreased endurance, Decreased mobility, Decreased range of motion, Decreased strength, Difficulty walking, Hypomobility, Increased muscle spasms, Impaired UE functional use, Improper body mechanics, Postural dysfunction, Pain  Visit Diagnosis: Cervicalgia  Other symptoms and signs involving the musculoskeletal system     Problem List Patient Active Problem List   Diagnosis Date Noted  . Leukocytosis 11/04/2016  . Fibromyalgia 02/10/2016  . Other fatigue 02/10/2016  . Primary osteoarthritis of both hands 02/10/2016  . History of migraine 02/10/2016  . History of thyroid nodule 02/10/2016  . History of sleep apnea 02/10/2016  . History of renal calculi 02/10/2016  . Pain in joint, shoulder region 10/02/2010  . Adhesive capsulitis of shoulder 10/02/2010   . Muscle weakness (generalized) 10/02/2010    12/02/2010, PT CLT [phone] 02/15/2019, 2:05 PM  [facility] [address] [place], [place], [place] Phone: [phone]   Fax:  [phone]  Name: Christina Lewis MRN: [name] Date of Birth: 1955/03/26

## 2019-02-19 ENCOUNTER — Encounter (HOSPITAL_COMMUNITY): Payer: Self-pay | Admitting: Physical Therapy

## 2019-02-19 ENCOUNTER — Ambulatory Visit (HOSPITAL_COMMUNITY): Payer: Medicare Other | Admitting: Physical Therapy

## 2019-02-19 ENCOUNTER — Other Ambulatory Visit: Payer: Self-pay

## 2019-02-19 DIAGNOSIS — M542 Cervicalgia: Secondary | ICD-10-CM

## 2019-02-19 DIAGNOSIS — R29898 Other symptoms and signs involving the musculoskeletal system: Secondary | ICD-10-CM

## 2019-02-19 NOTE — Therapy (Signed)
[facility] [address] [place], [place], [address] Phone: [phone]   Fax:  [phone]  Physical Therapy Treatment  Patient Details  Name: Christina Lewis MRN: [id number] Date of Birth: [birth year] Referring Provider (PT): Pollyann Savoy MD   Encounter Date: 02/19/2019  PT End of Session - 02/19/19 1521    Visit Number  7    Number of Visits  12    Date for PT Re-Evaluation  02/27/19   mini reassess 02/06/19   Authorization Type  Primary: Medicare Part A and B Secondary: [employer] (no auth required)    Authorization Time Period  01/16/19-02/27/19    Authorization - Visit Number  7    Authorization - Number of Visits  10    PT Start Time  1515    PT Stop Time  1557    PT Time Calculation (min)  42 min    Activity Tolerance  Patient tolerated treatment well    Behavior During Therapy  [facility] for tasks assessed/performed       Past Medical History:  Diagnosis Date  . Arthritis   . Asthma 2007  . Diabetes mellitus   . Fibromyalgia   . Gout   . Hematuria   . History of tics   . Interstitial cystitis 2016  . Migraines   . Osteoarthritis   . PCOS (polycystic ovarian syndrome) 1993  . PONV (postoperative nausea and vomiting)   . Sleep apnea   . 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 Thyroid nodule     Past Surgical History:  Procedure Laterality Date  . ABDOMINAL HYSTERECTOMY  2000  . APPENDECTOMY  1976  . BREAST LUMPECTOMY  1989   lt-negative  . CARDIOVASCULAR STRESS TEST  2000  . CHOLECYSTECTOMY  1985  . COLONOSCOPY    . HYSTERECTOMY ABDOMINAL WITH SALPINGECTOMY Bilateral 2000  . KIDNEY STONE SURGERY Right 2014   with stent placement in OR  . KNEE ARTHROSCOPY Right 08/22/2013   Procedure: ARTHROSCOPY RIGHT KNEE FOR INFECTION LAVAGE AND DRAINAGE;  Surgeon: Thera Flake., MD;  Location: [facility];  Service: Orthopedics;  Laterality: Right;   . KNEE ARTHROSCOPY WITH PATELLA RECONSTRUCTION Right 08/06/2013   Procedure: RIGHT KNEE ARTHROSCOPY WITH MENISCECTOMY MEDIAL, ARTHROSCOPY KNEE WITH DEBRIDEMENT/SHAVING (CHONDROPLASTY) ;  Surgeon: Thera Flake., MD;  Location: [facility];  Service: Orthopedics;  Laterality: Right;  . NASAL SINUS SURGERY     x4  . OOPHORECTOMY    . SHOULDER SURGERY  2011   right shoulder  . THYROID SURGERY  1994  . TONSILLECTOMY Bilateral 1974    There were no vitals filed for this visit.  Subjective Assessment - 02/19/19 1514    Subjective  Patient states she has had a horrible last week and has not been feeling good. She was pushing a grocery cart and it ran into a hole and now she is sore all over. She has been having headaches and neck pain and pain all over.    Pertinent History  RA, Fibromyalgia, diabetes, chronic headaches    Limitations  Lifting;Sitting;Other (comment);House hold activities;Reading   postures, laundry   How long can you sit comfortably?  20 minutes    How long can you stand comfortably?  1 hour    Patient Stated Goals  decrease neck pain    Currently in Pain?  Yes    Pain  Score  4     Pain Location  Neck    Pain Orientation  Left    Pain Onset  More than a month ago                       [facility] Adult PT Treatment/Exercise - 02/19/19 0001      Neck Exercises: Standing   Other Standing Exercises  standing shoulder extension with cueing for posture 2x10      Neck Exercises: Seated   Cervical Isometrics  Extension;Right lateral flexion;Left lateral flexion;5 reps    Neck Retraction  10 reps    Cervical Rotation  5 reps    Lateral Flexion  Both;5 reps      Neck Exercises: Supine   Neck Retraction  10 reps      Manual Therapy   Manual Therapy  Joint mobilization;Soft tissue mobilization;Manual Traction    Manual therapy comments  done seperate from all other aspects of treatment     Joint Mobilization  Grade II-III L UPA C3-C7; manual  cervical retraction with overpressure 2 x 10    Soft tissue mobilization  L Upper trap, bilateral sub occipitals, L cervical paraspinals; self STM with tennis ball     Manual Traction  15 seconds x 4 for pain relief             PT Education - 02/19/19 1519    Education Details  Patient educated on importance of HEP, completion of HEP, and completing exercises as tolerated as well as monitoring symptoms.    Person(s) Educated  Patient    Methods  Explanation    Comprehension  Verbalized understanding       PT Short Term Goals - 01/17/19 1414      PT SHORT TERM GOAL #1   Title  Patient will be independent with HEP in order to optimze functional outcomes.    Time  3    Period  Weeks    Status  On-going    Target Date  02/06/19        PT Long Term Goals - 01/17/19 1414      PT LONG TERM GOAL #1   Title  Patient will demonstrate at least 10% improvement in cervical ROM in restricted motions in order to demonstrate improved function with ADL,    Time  6    Period  Weeks    Status  On-going      PT LONG TERM GOAL #2   Title  Patient will improve FOTO score by at least 4% in order to demonstrate improved tolerance to activity.    Time  6    Period  Weeks    Status  On-going      PT LONG TERM GOAL #3   Title  Patient will report ability to manage neck pain/headaches with HEP in order to transition to self care.    Time  6    Period  Weeks    Status  On-going      PT LONG TERM GOAL #4   Title  Patient will experience neck pain/headaches no greater than 2/10 in order to improve quality of life.    Time  6    Period  Weeks    Status  On-going            Plan - 02/19/19 1606    Clinical Impression Statement  Patient has decrease in symptoms following manual therapy interventions today. She is able  to perform self STM with ball and is educated on using a ball at home to decrease tissue tension in paraspinal musculature. She requires min verbal cueing for decreasing  intensity of cervical spine isometrics and for keeping head in neutral position. She is educated on completing full HEP to improve carry over of symptoms. Patient will continue to benefit from skilled physical therapy in order to reduce impairment and improve function.    Personal Factors and Comorbidities  Comorbidity 3+;Past/Current Experience;Behavior Pattern    Comorbidities  RA, Fibromyalgia, diabetes, chronic headaches    Examination-Activity Limitations  Bed Mobility;Bend;Sleep;Carry;Dressing;Hygiene/Grooming;Lift;Reach Overhead    Examination-Participation Restrictions  Cleaning;Community Activity;Driving;Laundry;Meal Prep;Shop;Volunteer;Yard Work    Rehab Potential  Good    PT Frequency  2x / week    PT Duration  6 weeks    PT Treatment/Interventions  ADLs/Self Care Home Management;Aquatic Therapy;Biofeedback;Electrical Stimulation;Cryotherapy;Iontophoresis 4mg /ml Dexamethasone;Moist Heat;Traction;Ultrasound;Functional mobility training;Therapeutic activities;Therapeutic exercise;Balance training;Neuromuscular re-education;Patient/family education;Manual techniques;Passive range of motion;Dry needling;Energy conservation;Splinting;Taping;Vestibular;Spinal Manipulations;Joint Manipulations    PT Next Visit Plan  Continue to progress postural strengthening, possibly continue with manual if needed    PT Home Exercise Plan  01/16/19: Cervical retractions in supine 2 x 10; 01/24/19: thoracic ext over chair x 10, self STM with cane, Rows green band 2x10 01/29/19: horizontal abduction red band 2x10 02/19/19: shoulder extension green band 2x10, Self STM with tennis ball    Consulted and Agree with Plan of Care  Patient       Patient will benefit from skilled therapeutic intervention in order to improve the following deficits and impairments:  Decreased activity tolerance, Decreased balance, Decreased coordination, Decreased endurance, Decreased mobility, Decreased range of motion, Decreased strength,  Difficulty walking, Hypomobility, Increased muscle spasms, Impaired UE functional use, Improper body mechanics, Postural dysfunction, Pain  Visit Diagnosis: Cervicalgia  Other symptoms and signs involving the musculoskeletal system     Problem List Patient Active Problem List   Diagnosis Date Noted  . Leukocytosis 11/04/2016  . Fibromyalgia 02/10/2016  . Other fatigue 02/10/2016  . Primary osteoarthritis of both hands 02/10/2016  . History of migraine 02/10/2016  . History of thyroid nodule 02/10/2016  . History of sleep apnea 02/10/2016  . History of renal calculi 02/10/2016  . Pain in joint, shoulder region 10/02/2010  . Adhesive capsulitis of shoulder 10/02/2010  . Muscle weakness (generalized) 10/02/2010    4:17 PM, 02/19/19 Wyman Songster PT, DPT Physical Therapist at [facility]   [facility] [address] [place], [place], [address] Phone: [phone]   Fax:  [phone]  Name: Christina Lewis MRN: [id number] Date of Birth: August 14, 1955

## 2019-02-21 ENCOUNTER — Telehealth (HOSPITAL_COMMUNITY): Payer: Self-pay | Admitting: Physical Therapy

## 2019-02-21 ENCOUNTER — Ambulatory Visit (HOSPITAL_COMMUNITY): Payer: Medicare Other | Admitting: Physical Therapy

## 2019-02-21 NOTE — Telephone Encounter (Signed)
Patient no show for appointment, left message on her voicemail making her aware of missed appointment and reminding her of next appointment.

## 2019-02-26 ENCOUNTER — Ambulatory Visit (HOSPITAL_COMMUNITY): Payer: Medicare Other | Admitting: Physical Therapy

## 2019-02-26 ENCOUNTER — Telehealth (HOSPITAL_COMMUNITY): Payer: Self-pay | Admitting: Physical Therapy

## 2019-02-26 NOTE — Telephone Encounter (Signed)
L/m cx today patient has migrain headache

## 2019-02-28 ENCOUNTER — Ambulatory Visit (HOSPITAL_COMMUNITY): Payer: Medicare Other | Attending: Rheumatology | Admitting: Physical Therapy

## 2019-03-01 ENCOUNTER — Other Ambulatory Visit: Payer: Self-pay | Admitting: Rheumatology

## 2019-03-01 ENCOUNTER — Telehealth (HOSPITAL_COMMUNITY): Payer: Self-pay | Admitting: Physical Therapy

## 2019-03-01 ENCOUNTER — Other Ambulatory Visit: Payer: Self-pay | Admitting: Physician Assistant

## 2019-03-01 DIAGNOSIS — M0609 Rheumatoid arthritis without rheumatoid factor, multiple sites: Secondary | ICD-10-CM

## 2019-03-01 NOTE — Telephone Encounter (Signed)
Patient no show #2, Left voicemail making patient aware of missed appointment and letting her know she has no more scheduled appointments and that she can contact the clinic to schedule more appointments.   10:09 AM, 03/01/19 Wyman Songster PT, DPT Physical Therapist at [facility]

## 2019-03-02 NOTE — Telephone Encounter (Signed)
Last Visit: 12/22/2018 Next Visit: 05/18/2019 Labs: 11/09/2018 Labs are stable ,white cell count is mildly elevated. Eye exam: 11/09/2018   Okay to refill per Dr. Corliss Skains.

## 2019-03-02 NOTE — Telephone Encounter (Signed)
Last Visit: 12/22/2018 Next Visit: 05/18/2019  Okay to refill per Dr. Corliss Skains.

## 2019-03-15 ENCOUNTER — Ambulatory Visit: Payer: BC Managed Care – PPO | Admitting: Rheumatology

## 2019-04-02 ENCOUNTER — Other Ambulatory Visit: Payer: Self-pay | Admitting: Rheumatology

## 2019-04-03 NOTE — Telephone Encounter (Signed)
Last Visit: 12/22/2018 Next Visit: 05/18/2019  Okay to refill per Dr. Deveshwar.  

## 2019-04-17 ENCOUNTER — Other Ambulatory Visit (HOSPITAL_COMMUNITY): Payer: Self-pay | Admitting: Internal Medicine

## 2019-04-17 DIAGNOSIS — Z1231 Encounter for screening mammogram for malignant neoplasm of breast: Secondary | ICD-10-CM

## 2019-04-20 ENCOUNTER — Ambulatory Visit (HOSPITAL_COMMUNITY): Payer: BC Managed Care – PPO

## 2019-04-23 ENCOUNTER — Ambulatory Visit (HOSPITAL_COMMUNITY): Payer: BC Managed Care – PPO

## 2019-04-24 ENCOUNTER — Encounter (INDEPENDENT_AMBULATORY_CARE_PROVIDER_SITE_OTHER): Payer: Self-pay | Admitting: *Deleted

## 2019-04-26 NOTE — Progress Notes (Signed)
Office Visit Note  Patient: Christina Lewis             Date of Birth: 23-Jan-1956           MRN: [id number]             PCP: Kirstie Peri, MD Referring: Kirstie Peri, MD Visit Date: 04/27/2019 Occupation: @GUAROCC @  Subjective:  Right hip pain   History of Present Illness: Christina Lewis is a 64 y.o. female with history of seronegative rheumatoid arthritis, fibromyalgia, and osteoarthritis.  She is taking plaquenil 200 mg 1 tablet by mouth twice daily M-F.  She states she has intermittent pain in both hands and both feet.  She has occasional swelling in her hands and feet.  She presents today with right hip pain, which has progressively been getting worse over the past 2 weeks.  She states at times the discomfort is severe at night and makes it difficult to walk.  She has tried applying heat, which provides temporary relief.  She continues to have generalized myalgias related to fibromyalgia.    Activities of Daily Living:  Patient reports joint stiffness all day  Patient Reports nocturnal pain.  Difficulty dressing/grooming: Denies Difficulty climbing stairs: Denies Difficulty getting out of chair: Reports Difficulty using hands for taps, buttons, cutlery, and/or writing: Reports  Review of Systems  Constitutional: Positive for fatigue.  HENT: Positive for mouth sores and nose dryness. Negative for mouth dryness.   Eyes: Negative for itching and dryness.  Respiratory: Negative for cough, shortness of breath and difficulty breathing.   Cardiovascular: Negative for chest pain, palpitations, hypertension and swelling in legs/feet.  Gastrointestinal: Negative for blood in stool, constipation and diarrhea.  Endocrine: Negative for increased urination.  Genitourinary: Negative for difficulty urinating and painful urination.  Musculoskeletal: Positive for arthralgias, joint pain, joint swelling, morning stiffness and muscle tenderness. Negative for myalgias, muscle weakness and  myalgias.  Skin: Negative for rash, hair loss and sensitivity to sunlight.  Allergic/Immunologic: Negative for susceptible to infections.  Neurological: Negative for dizziness, numbness, headaches, memory loss and weakness.  Hematological: Negative for bruising/bleeding tendency.  Psychiatric/Behavioral: Negative for depressed mood, confusion and sleep disturbance.    PMFS History:  Patient Active Problem List   Diagnosis Date Noted  . Leukocytosis 11/04/2016  . Fibromyalgia 02/10/2016  . Other fatigue 02/10/2016  . Primary osteoarthritis of both hands 02/10/2016  . History of migraine 02/10/2016  . History of thyroid nodule 02/10/2016  . History of sleep apnea 02/10/2016  . History of renal calculi 02/10/2016  . Pain in joint, shoulder region 10/02/2010  . Adhesive capsulitis of shoulder 10/02/2010  . Muscle weakness (generalized) 10/02/2010    Past Medical History:  Diagnosis Date  . Arthritis   . Asthma 2007  . Diabetes mellitus   . Fibromyalgia   . Gout   . Hematuria   . History of tics   . Interstitial cystitis 2016  . Migraines   . Osteoarthritis   . PCOS (polycystic ovarian syndrome) 1993  . PONV (postoperative nausea and vomiting)   . Sleep apnea   . 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 Thyroid nodule     Family History  Problem Relation Age of Onset  . Fibromyalgia Mother   . Psoriasis Mother        psoriatic arthritis   . Diabetes Mother   . Heart disease Mother   . Psoriasis Sister  psoriatic arthritis   . Diabetes Sister   . Rheum arthritis Sister   . Fibromyalgia Sister   . Diabetes Sister    Past Surgical History:  Procedure Laterality Date  . ABDOMINAL HYSTERECTOMY  2000  . APPENDECTOMY  1976  . BREAST LUMPECTOMY  1989   lt-negative  . CARDIOVASCULAR STRESS TEST  2000  . CHOLECYSTECTOMY  1985  . COLONOSCOPY    . HYSTERECTOMY ABDOMINAL WITH SALPINGECTOMY Bilateral 2000  . KIDNEY  STONE SURGERY Right 2014   with stent placement in OR  . KNEE ARTHROSCOPY Right 08/22/2013   Procedure: ARTHROSCOPY RIGHT KNEE FOR INFECTION LAVAGE AND DRAINAGE;  Surgeon: Thera Flake., MD;  Location: [facility];  Service: Orthopedics;  Laterality: Right;  . KNEE ARTHROSCOPY WITH PATELLA RECONSTRUCTION Right 08/06/2013   Procedure: RIGHT KNEE ARTHROSCOPY WITH MENISCECTOMY MEDIAL, ARTHROSCOPY KNEE WITH DEBRIDEMENT/SHAVING (CHONDROPLASTY) ;  Surgeon: Thera Flake., MD;  Location:  SUR[facility]ERY C[facility];  Service: Orthopedics;  Laterality: Right;  . NASAL SINUS SURGERY     x4  . OOPHORECTOMY    . SHOULDER SURGERY  2011   right shoulder  . THYROID SURGERY  1994  . TONSILLECTOMY Bilateral 1974   Social History   Social History Narrative  . Not on file    There is no immunization history on file for this patient.   Objective: Vital Signs: BP 115/74 (BP Location: Left Arm, Patient Position: Sitting, Cuff Size: Normal)   Pulse 77   Resp 13   Ht 5\' 4"  (1.626 m)   Wt 161 lb 6.4 oz (73.2 kg)   BMI 27.70 kg/m    Physical Exam Vitals and nursing note reviewed.  Constitutional:      Appearance: She is well-developed.  HENT:     Head: Normocephalic and atraumatic.  Eyes:     Conjunctiva/sclera: Conjunctivae normal.  Pulmonary:     Effort: Pulmonary effort is normal.  Abdominal:     General: Bowel sounds are normal.     Palpations: Abdomen is soft.  Musculoskeletal:     Cervical back: Normal range of motion.  Lymphadenopathy:     Cervical: No cervical adenopathy.  Skin:    General: Skin is warm and dry.     Capillary Refill: Capillary refill takes less than 2 seconds.  Neurological:     Mental Status: She is alert and oriented to person, place, and time.  Psychiatric:        Behavior: Behavior normal.      Musculoskeletal Exam: C-spine, thoracic spine, and lumbar spine good ROM.  No midline spinal tenderness.  No SI joint tenderness.  Shoulder joints,  elbow joints, wrist joints, MCPs, PIPs, and DIPs good ROM with no synovitis.  Complete fist formation bilaterally.  Hip joints, knee joints, ankle joints good ROM with no synovitis.  No warmth or effusion of knee joints noted.  No tenderness or swelling of ankle joints hammertoes noted.  She has tenderness over the right trochanteric bursa.  CDAI Exam: CDAI Score: -- Patient Global: --; Provider Global: -- Swollen: --; Tender: -- Joint Exam 04/27/2019   No joint exam has been documented for this visit   There is currently no information documented on the homunculus. Go to the Rheumatology activity and complete the homunculus joint exam.  Investigation: No additional findings.  Imaging: No results found.  Recent Labs: Lab Results  Component Value Date   WBC 14.5 (H) 11/09/2018   HGB 14.1 11/09/2018   PLT 407 (  H) 11/09/2018   NA 141 11/09/2018   K 4.5 11/09/2018   CL 106 11/09/2018   CO2 26 11/09/2018   GLUCOSE 97 11/09/2018   BUN 22 11/09/2018   CREATININE 0.77 11/09/2018   BILITOT 0.3 11/09/2018   ALKPHOS 59 11/04/2016   AST 20 11/09/2018   ALT 22 11/09/2018   PROT 6.8 11/09/2018   ALBUMIN 4.0 11/04/2016   CALCIUM 10.2 11/09/2018   GFRAA 97 11/09/2018    Speciality Comments: PLQ Eye Exam: 11/09/18 WNL @ [facility] follow up in 12 months  Procedures:  No procedures performed Allergies: Sulfa antibiotics, Sulfa drugs cross reactors, Codeine, Erythromycin, Metronidazole, and Morphine and related   Assessment / Plan:     Visit Diagnoses: Rheumatoid arthritis of multiple sites with negative rheumatoid factor (HCC) - RF-, CCP-, 14-3-3 eta positive: She has no synovitis on exam.  She is not had any recent rheumatoid arthritis flares.  She is clinically doing well on Plaquenil 200 mg 1 tablet twice daily Monday through Friday.  She has not missed any doses of Plaquenil recently.  She experiences intermittent discomfort in both hands and both feet due to underlying  osteoarthritis.  Joint protection and muscle strengthening were discussed.  She will continue taking Plaquenil as prescribed.  She does not need a refill at this time.  She was advised to notify us if she develops increased joint pain or joint swelling.  She will follow-up in the office in 5 months  High risk medication use - PLQ 200 mg 1 tablet BID M-F. PLQ Eye Exam: 11/09/18 WNL @ [facility] follow up in 12 months.  CBC and CMP were drawn today to monitor for drug toxicity.- Plan: CBC with Differential/Platelet, COMPLETE METABOLIC PANEL WITH GFR  Fibromyalgia: She has generalized hyperalgesia and positive tender points on exam.  She has intermittent trapezius muscle tension and muscle spasms.  Discussed importance of performing neck exercises and focusing on appropriate posture.  She presents today with right trochanter bursitis.  She requested a cortisone injection.  She also requested refills of baclofen and Zanaflex which will be sent to the pharmacy.  We discussed the importance of regular exercise and good sleep hygiene.  Primary osteoarthritis of both hands: She has PIP and DIP thickening consistent with osteoarthritis of both hands.  No tenderness or synovitis was noted.  She has complete fist formation bilaterally.  Joint protection and muscle strengthening were discussed.  Primary osteoarthritis of right knee: She has good range of motion of the right knee joint on exam.  No warmth or effusion was noted.  Trochanteric bursitis of right hip: She has tenderness over the right trochanteric bursa.  She requested a cortisone injection today.  She tolerated procedure well.  The procedure note was completed above.  Aftercare was discussed.  She is given a handout of exercises to perform.  Chronic SI joint pain: She has no SI joint tenderness on exam.  DDD (degenerative disc disease), lumbar: She has good range of motion.  No midline spinal tenderness.  No symptoms of  radiculopathy.  Other fatigue:   Other medical conditions are listed as follows:  History of positive PPD  History of gastroesophageal reflux (GERD)  History of thyroid nodule  History of tics  History of sleep apnea  History of renal calculi  History of diabetes mellitus  History of migraine  History of depression  History of PCOS  Orders: Orders Placed This Encounter  Procedures  . CBC with  Differential/Platelet  . COMPLETE METABOLIC PANEL WITH GFR   Meds ordered this encounter  Medications  . baclofen (LIORESAL) 10 MG tablet    Sig: 1 tablet every morning and 1 tablet at noon    Dispense:  60 tablet    Refill:  0  . tiZANidine (ZANAFLEX) 4 MG tablet    Sig: Take 1 tablet (4 mg total) by mouth at bedtime.    Dispense:  30 tablet    Refill:  2  . amLODipine (NORVASC) 2.5 MG tablet    Sig: Take 1 tablet (2.5 mg total) by mouth daily.    Dispense:  90 tablet    Refill:  0    Face-to-face time spent with patient was 30 minutes. Greater than 50% of time was spent in counseling and coordination of care.  Follow-Up Instructions: Return in 5 months (on 09/27/2019) for Autoimmune disease, Rheumatoid arthritis, Osteoarthritis.   Hazel Sams, PA-C  I examined and evaluated the patient with Hazel Sams PA.  Patient continues to have some generalized pain and discomfort.  She does quite well on muscle relaxers.  Her other concern was Raynaud's phenomenon today.  We decided to give her a trial of amlodipine 2.5 mg p.o. daily.  If tolerated we can increase the medication dose further up to 5 mg p.o. daily.  Her rheumatoid arthritis is well controlled.  The plan of care was discussed as noted above.  Bo Merino, MD  Note - This record has been created using Editor, commissioning.  Chart creation errors have been sought, but may not always  have been located. Such creation errors do not reflect on  the standard of medical care.

## 2019-04-27 ENCOUNTER — Other Ambulatory Visit: Payer: Self-pay

## 2019-04-27 ENCOUNTER — Ambulatory Visit (INDEPENDENT_AMBULATORY_CARE_PROVIDER_SITE_OTHER): Payer: Medicare Other | Admitting: Rheumatology

## 2019-04-27 ENCOUNTER — Encounter: Payer: Self-pay | Admitting: Rheumatology

## 2019-04-27 VITALS — BP 115/74 | HR 77 | Resp 13 | Ht 64.0 in | Wt 161.4 lb

## 2019-04-27 DIAGNOSIS — M797 Fibromyalgia: Secondary | ICD-10-CM

## 2019-04-27 DIAGNOSIS — Z8742 Personal history of other diseases of the female genital tract: Secondary | ICD-10-CM

## 2019-04-27 DIAGNOSIS — M0609 Rheumatoid arthritis without rheumatoid factor, multiple sites: Secondary | ICD-10-CM

## 2019-04-27 DIAGNOSIS — Z9289 Personal history of other medical treatment: Secondary | ICD-10-CM

## 2019-04-27 DIAGNOSIS — R5383 Other fatigue: Secondary | ICD-10-CM

## 2019-04-27 DIAGNOSIS — M5136 Other intervertebral disc degeneration, lumbar region: Secondary | ICD-10-CM

## 2019-04-27 DIAGNOSIS — M19042 Primary osteoarthritis, left hand: Secondary | ICD-10-CM

## 2019-04-27 DIAGNOSIS — Z8639 Personal history of other endocrine, nutritional and metabolic disease: Secondary | ICD-10-CM

## 2019-04-27 DIAGNOSIS — M51369 Other intervertebral disc degeneration, lumbar region without mention of lumbar back pain or lower extremity pain: Secondary | ICD-10-CM

## 2019-04-27 DIAGNOSIS — M19041 Primary osteoarthritis, right hand: Secondary | ICD-10-CM

## 2019-04-27 DIAGNOSIS — Z8669 Personal history of other diseases of the nervous system and sense organs: Secondary | ICD-10-CM

## 2019-04-27 DIAGNOSIS — Z8719 Personal history of other diseases of the digestive system: Secondary | ICD-10-CM

## 2019-04-27 DIAGNOSIS — Z79899 Other long term (current) drug therapy: Secondary | ICD-10-CM

## 2019-04-27 DIAGNOSIS — Z87442 Personal history of urinary calculi: Secondary | ICD-10-CM

## 2019-04-27 DIAGNOSIS — G8929 Other chronic pain: Secondary | ICD-10-CM

## 2019-04-27 DIAGNOSIS — M7061 Trochanteric bursitis, right hip: Secondary | ICD-10-CM

## 2019-04-27 DIAGNOSIS — Z8659 Personal history of other mental and behavioral disorders: Secondary | ICD-10-CM

## 2019-04-27 DIAGNOSIS — M533 Sacrococcygeal disorders, not elsewhere classified: Secondary | ICD-10-CM

## 2019-04-27 DIAGNOSIS — M1711 Unilateral primary osteoarthritis, right knee: Secondary | ICD-10-CM

## 2019-04-27 MED ORDER — BACLOFEN 10 MG PO TABS: ORAL_TABLET | ORAL | 0 refills | Status: DC

## 2019-04-27 MED ORDER — TIZANIDINE HCL 4 MG PO TABS: 4.0000 mg | ORAL_TABLET | Freq: Every day | ORAL | 2 refills | Status: DC

## 2019-04-27 MED ORDER — AMLODIPINE BESYLATE 2.5 MG PO TABS: 2.5000 mg | ORAL_TABLET | Freq: Every day | ORAL | 0 refills | Status: DC

## 2019-04-27 NOTE — Patient Instructions (Signed)
Iliotibial Band Syndrome Rehab Ask your health care provider which exercises are safe for you. Do exercises exactly as told by your health care provider and adjust them as directed. It is normal to feel mild stretching, pulling, tightness, or discomfort as you do these exercises. Stop right away if you feel sudden pain or your pain gets significantly worse. Do not begin these exercises until told by your health care provider. Stretching and range-of-motion exercises These exercises warm up your muscles and joints and improve the movement and flexibility of your hip and pelvis. Quadriceps stretch, prone  1. Lie on your abdomen on a firm surface, such as a bed or padded floor (prone position). 2. Bend your left / right knee and reach back to hold your ankle or pant leg. If you cannot reach your ankle or pant leg, loop a belt around your foot and grab the belt instead. 3. Gently pull your heel toward your buttocks. Your knee should not slide out to the side. You should feel a stretch in the front of your thigh and knee (quadriceps). 4. Hold this position for __________ seconds. Repeat __________ times. Complete this exercise __________ times a day. Iliotibial band stretch An iliotibial band is a strong band of muscle tissue that runs from the outer side of your hip to the outer side of your thigh and knee. 1. Lie on your side with your left / right leg in the top position. 2. Bend both of your knees and grab your left / right ankle. Stretch out your bottom arm to help you balance. 3. Slowly bring your top knee back so your thigh goes behind your trunk. 4. Slowly lower your top leg toward the floor until you feel a gentle stretch on the outside of your left / right hip and thigh. If you do not feel a stretch and your knee will not fall farther, place the heel of your other foot on top of your knee and pull your knee down toward the floor with your foot. 5. Hold this position for __________  seconds. Repeat __________ times. Complete this exercise __________ times a day. Strengthening exercises These exercises build strength and endurance in your hip and pelvis. Endurance is the ability to use your muscles for a long time, even after they get tired. Straight leg raises, side-lying This exercise strengthens the muscles that rotate the leg at the hip and move it away from your body (hip abductors). 1. Lie on your side with your left / right leg in the top position. Lie so your head, shoulder, hip, and knee line up. You may bend your bottom knee to help you balance. 2. Roll your hips slightly forward so your hips are stacked directly over each other and your left / right knee is facing forward. 3. Tense the muscles in your outer thigh and lift your top leg 4-6 inches (10-15 cm). 4. Hold this position for __________ seconds. 5. Slowly return to the starting position. Let your muscles relax completely before doing another repetition. Repeat __________ times. Complete this exercise __________ times a day. Leg raises, prone This exercise strengthens the muscles that move the hips (hip extensors). 1. Lie on your abdomen on your bed or a firm surface. You can put a pillow under your hips if that is more comfortable for your lower back. 2. Bend your left / right knee so your foot is straight up in the air. 3. Squeeze your buttocks muscles and lift your left / right thigh   off the bed. Do not let your back arch. 4. Tense your thigh muscle as hard as you can without increasing any knee pain. 5. Hold this position for __________ seconds. 6. Slowly lower your leg to the starting position and allow it to relax completely. Repeat __________ times. Complete this exercise __________ times a day. Hip hike 1. Stand sideways on a bottom step. Stand on your left / right leg with your other foot unsupported next to the step. You can hold on to the railing or wall for balance if needed. 2. Keep your knees  straight and your torso square. Then lift your left / right hip up toward the ceiling. 3. Slowly let your left / right hip lower toward the floor, past the starting position. Your foot should get closer to the floor. Do not lean or bend your knees. Repeat __________ times. Complete this exercise __________ times a day. This information is not intended to replace advice given to you by your health care provider. Make sure you discuss any questions you have with your health care provider. Document Revised: 06/01/2018 Document Reviewed: 11/30/2017 Elsevier Patient Education  2020 [employer]   Hip Bursitis Rehab Ask your health care provider which exercises are safe for you. Do exercises exactly as told by your health care provider and adjust them as directed. It is normal to feel mild stretching, pulling, tightness, or discomfort as you do these exercises. Stop right away if you feel sudden pain or your pain gets worse. Do not begin these exercises until told by your health care provider. Stretching exercise This exercise warms up your muscles and joints and improves the movement and flexibility of your hip. This exercise also helps to relieve pain and stiffness. Iliotibial band stretch An iliotibial band is a strong band of muscle tissue that runs from the outer side of your hip to the outer side of your thigh and knee. 1. Lie on your side with your left / right leg in the top position. 2. Bend your left / right knee and grab your ankle. Stretch out your bottom arm to help you balance. 3. Slowly bring your knee back so your thigh is behind your body. 4. Slowly lower your knee toward the floor until you feel a gentle stretch on the outside of your left / right thigh. If you do not feel a stretch and your knee will not fall farther, place the heel of your other foot on top of your knee and pull your knee down toward the floor with your foot. 5. Hold this position for __________ seconds. 6. Slowly  return to the starting position. Repeat __________ times. Complete this exercise __________ times a day. Strengthening exercises These exercises build strength and endurance in your hip and pelvis. Endurance is the ability to use your muscles for a long time, even after they get tired. Bridge This exercise strengthens the muscles that move your thigh backward (hip extensors). 1. Lie on your back on a firm surface with your knees bent and your feet flat on the floor. 2. Tighten your buttocks muscles and lift your buttocks off the floor until your trunk is level with your thighs. ? Do not arch your back. ? You should feel the muscles working in your buttocks and the back of your thighs. If you do not feel these muscles, slide your feet 1-2 inches (2.5-5 cm) farther away from your buttocks. ? If this exercise is too easy, try doing it with your arms crossed   over your chest. 3. Hold this position for __________ seconds. 4. Slowly lower your hips to the starting position. 5. Let your muscles relax completely after each repetition. Repeat __________ times. Complete this exercise __________ times a day. Squats This exercise strengthens the muscles in front of your thigh and knee (quadriceps). 1. Stand in front of a table, with your feet and knees pointing straight ahead. You may rest your hands on the table for balance but not for support. 2. Slowly bend your knees and lower your hips like you are going to sit in a chair. ? Keep your weight over your heels, not over your toes. ? Keep your lower legs upright so they are parallel with the table legs. ? Do not let your hips go lower than your knees. ? Do not bend lower than told by your health care provider. ? If your hip pain increases, do not bend as low. 3. Hold the squat position for __________ seconds. 4. Slowly push with your legs to return to standing. Do not use your hands to pull yourself to standing. Repeat __________ times. Complete this  exercise __________ times a day. Hip hike 1. Stand sideways on a bottom step. Stand on your left / right leg with your other foot unsupported next to the step. You can hold on to the railing or wall for balance if needed. 2. Keep your knees straight and your torso square. Then lift your left / right hip up toward the ceiling. 3. Hold this position for __________ seconds. 4. Slowly let your left / right hip lower toward the floor, past the starting position. Your foot should get closer to the floor. Do not lean or bend your knees. Repeat __________ times. Complete this exercise __________ times a day. Single leg stand 1. Without shoes, stand near a railing or in a doorway. You may hold on to the railing or door frame as needed for balance. 2. Squeeze your left / right buttock muscles, then lift up your other foot. ? Do not let your left / right hip push out to the side. ? It is helpful to stand in front of a mirror for this exercise so you can watch your hip. 3. Hold this position for __________ seconds. Repeat __________ times. Complete this exercise __________ times a day. This information is not intended to replace advice given to you by your health care provider. Make sure you discuss any questions you have with your health care provider. Document Revised: 06/05/2018 Document Reviewed: 06/05/2018 Elsevier Patient Education  2020 [employer]  

## 2019-04-28 LAB — COMPLETE METABOLIC PANEL WITH GFR
AG Ratio: 1.9 (calc) (ref 1.0–2.5)
ALT: 27 U/L (ref 6–29)
AST: 20 U/L (ref 10–35)
Albumin: 4.1 g/dL (ref 3.6–5.1)
Alkaline phosphatase (APISO): 61 U/L (ref 37–153)
BUN: 24 mg/dL (ref 7–25)
CO2: 25 mmol/L (ref 20–32)
Calcium: 10.1 mg/dL (ref 8.6–10.4)
Chloride: 105 mmol/L (ref 98–110)
Creat: 0.72 mg/dL (ref 0.50–0.99)
GFR, Est African American: 104 mL/min/{1.73_m2} (ref >=60)
GFR, Est Non African American: 90 mL/min/{1.73_m2} (ref >=60)
Globulin: 2.2 g/dL (calc) (ref 1.9–3.7)
Glucose, Bld: 92 mg/dL (ref 65–99)
Potassium: 4.3 mmol/L (ref 3.5–5.3)
Sodium: 140 mmol/L (ref 135–146)
Total Bilirubin: 0.2 mg/dL (ref 0.2–1.2)
Total Protein: 6.3 g/dL (ref 6.1–8.1)

## 2019-04-28 LAB — CBC WITH DIFFERENTIAL/PLATELET
Absolute Monocytes: 960 cells/uL — ABNORMAL HIGH (ref 200–950)
Basophils Absolute: 141 cells/uL (ref 0–200)
Basophils Relative: 1.1 %
Eosinophils Absolute: 691 cells/uL — ABNORMAL HIGH (ref 15–500)
Eosinophils Relative: 5.4 %
HCT: 40.5 % (ref 35.0–45.0)
Hemoglobin: 13.8 g/dL (ref 11.7–15.5)
Lymphs Abs: 3456 cells/uL (ref 850–3900)
MCH: 30.5 pg (ref 27.0–33.0)
MCHC: 34.1 g/dL (ref 32.0–36.0)
MCV: 89.6 fL (ref 80.0–100.0)
MPV: 10.1 fL (ref 7.5–12.5)
Monocytes Relative: 7.5 %
Neutro Abs: 7552 cells/uL (ref 1500–7800)
Neutrophils Relative %: 59 %
Platelets: 350 10*3/uL (ref 140–400)
RBC: 4.52 10*6/uL (ref 3.80–5.10)
RDW: 13 % (ref 11.0–15.0)
Total Lymphocyte: 27 %
WBC: 12.8 10*3/uL — ABNORMAL HIGH (ref 3.8–10.8)

## 2019-04-30 NOTE — Progress Notes (Signed)
WBC count is mildly elevated but trending down.  CMP WNL.

## 2019-05-04 ENCOUNTER — Ambulatory Visit (HOSPITAL_COMMUNITY)
Admission: RE | Admit: 2019-05-04 | Discharge: 2019-05-04 | Disposition: A | Discharge status: Home / Self Care | Payer: Medicare Other | Source: Ambulatory Visit | Attending: Internal Medicine | Admitting: Internal Medicine

## 2019-05-04 ENCOUNTER — Other Ambulatory Visit: Payer: Self-pay

## 2019-05-04 DIAGNOSIS — Z1231 Encounter for screening mammogram for malignant neoplasm of breast: Secondary | ICD-10-CM | POA: Insufficient documentation

## 2019-05-18 ENCOUNTER — Ambulatory Visit: Payer: BC Managed Care – PPO | Admitting: Rheumatology

## 2019-05-24 ENCOUNTER — Other Ambulatory Visit: Payer: Self-pay | Admitting: Rheumatology

## 2019-05-24 NOTE — Telephone Encounter (Signed)
Last Visit: 04/27/19 Next visit: 09/27/19  Okay to refill per Dr. Deveshwar  

## 2019-05-30 ENCOUNTER — Other Ambulatory Visit: Payer: Self-pay | Admitting: Physician Assistant

## 2019-05-30 ENCOUNTER — Other Ambulatory Visit: Payer: Self-pay | Admitting: Rheumatology

## 2019-05-30 DIAGNOSIS — M0609 Rheumatoid arthritis without rheumatoid factor, multiple sites: Secondary | ICD-10-CM

## 2019-05-30 NOTE — Telephone Encounter (Signed)
Last Visit: 04/27/19 Next visit: 09/27/19 Labs: 04/27/19 WBC count is mildly elevated but trending down. CMP WNL. PLQ Eye Exam: 11/09/18 WNL  Current Dose per office note on 04/27/19: PLQ 200 mg 1 tablet BID M-F.  Okay to refill per Dr. Corliss Skains

## 2019-05-30 NOTE — Telephone Encounter (Signed)
Last Visit: 04/27/19 Next visit: 09/27/19  Okay to refill per Dr. Corliss Skains

## 2019-06-21 ENCOUNTER — Other Ambulatory Visit: Payer: Self-pay | Admitting: Endocrinology

## 2019-06-21 DIAGNOSIS — E049 Nontoxic goiter, unspecified: Secondary | ICD-10-CM

## 2019-06-27 ENCOUNTER — Other Ambulatory Visit: Payer: Self-pay | Admitting: Rheumatology

## 2019-06-27 NOTE — Telephone Encounter (Signed)
Last Visit: 04/27/19 Next visit: 09/27/19  Okay to refill per Dr. Deveshwar  

## 2019-06-28 ENCOUNTER — Other Ambulatory Visit: Payer: Medicare Other

## 2019-07-11 NOTE — Progress Notes (Signed)
Office Visit Note  Patient: Christina Lewis             Date of Birth: [birth year]           MRN: [id number]             PCP: Kirstie Peri, MD Referring: Kirstie Peri, MD Visit Date: 07/12/2019 Occupation: @GUAROCC @  Subjective:  Leg Pain (Pain from right hip down to foot)   History of Present Illness: Christina Lewis is a 64 y.o. female with history of rheumatoid arthritis, osteoarthritis and fibromyalgia syndrome.  She states about couple of weeks ago she tripped over her cat and landed up on her right knee.  She states the  right knee joint pain has eased off but she has been having pain in her right hip.  Which she describes over the right trochanteric area.  She states her husband had a recent surgery and she had to do extra chores in the house which is also putting stress on her joints.  She is having difficulty sitting on her right hip and also walking.  She has nocturnal pain.  She is having some generalized overall pain from fibromyalgia.  There is no history of joint swelling.  Activities of Daily Living:  Patient reports morning stiffness for 24 hours.   Patient Reports nocturnal pain.  Difficulty dressing/grooming: Denies Difficulty climbing stairs: Reports Difficulty getting out of chair: Reports Difficulty using hands for taps, buttons, cutlery, and/or writing: Reports  Review of Systems  Constitutional: Positive for fatigue. Negative for night sweats, weight gain and weight loss.  HENT: Positive for mouth dryness. Negative for mouth sores, trouble swallowing, trouble swallowing and nose dryness.   Eyes: Positive for itching. Negative for pain, redness, visual disturbance and dryness.  Respiratory: Negative for cough, shortness of breath and difficulty breathing.   Cardiovascular: Negative for chest pain, palpitations, hypertension, irregular heartbeat and swelling in legs/feet.  Gastrointestinal: Negative for blood in stool, constipation and diarrhea.  Endocrine:  Positive for cold intolerance. Negative for heat intolerance and increased urination.  Genitourinary: Negative for difficulty urinating and vaginal dryness.  Musculoskeletal: Positive for arthralgias, joint pain, morning stiffness and muscle tenderness. Negative for joint swelling, myalgias, muscle weakness and myalgias.  Skin: Negative for color change, rash, hair loss, skin tightness, ulcers and sensitivity to sunlight.  Allergic/Immunologic: Negative for susceptible to infections.  Neurological: Negative for dizziness, memory loss, night sweats and weakness.  Hematological: Negative for bruising/bleeding tendency and swollen glands.  Psychiatric/Behavioral: Positive for sleep disturbance. Negative for depressed mood. The patient is not nervous/anxious.     PMFS History:  Patient Active Problem List   Diagnosis Date Noted  . Leukocytosis 11/04/2016  . Fibromyalgia 02/10/2016  . Other fatigue 02/10/2016  . Primary osteoarthritis of both hands 02/10/2016  . History of migraine 02/10/2016  . History of thyroid nodule 02/10/2016  . History of sleep apnea 02/10/2016  . History of renal calculi 02/10/2016  . Pain in joint, shoulder region 10/02/2010  . Adhesive capsulitis of shoulder 10/02/2010  . Muscle weakness (generalized) 10/02/2010    Past Medical History:  Diagnosis Date  . Arthritis   . Asthma 2007  . Diabetes mellitus   . Fibromyalgia   . Gout   . Hematuria   . History of tics   . Interstitial cystitis 2016  . Migraines   . Osteoarthritis   . PCOS (polycystic ovarian syndrome) 1993  . PONV (postoperative nausea and vomiting)   . Sleep apnea   .  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 Thyroid nodule     Family History  Problem Relation Age of Onset  . Fibromyalgia Mother   . Psoriasis Mother        psoriatic arthritis   . Diabetes Mother   . Heart disease Mother   . Psoriasis Sister        psoriatic arthritis   .  Diabetes Sister   . Rheum arthritis Sister   . Fibromyalgia Sister   . Diabetes Sister    Past Surgical History:  Procedure Laterality Date  . ABDOMINAL HYSTERECTOMY  2000  . APPENDECTOMY  1976  . BREAST LUMPECTOMY  1989   lt-negative  . CARDIOVASCULAR STRESS TEST  2000  . CHOLECYSTECTOMY  1985  . COLONOSCOPY    . HYSTERECTOMY ABDOMINAL WITH SALPINGECTOMY Bilateral 2000  . KIDNEY STONE SURGERY Right 2014   with stent placement in OR  . KNEE ARTHROSCOPY Right 08/22/2013   Procedure: ARTHROSCOPY RIGHT KNEE FOR INFECTION LAVAGE AND DRAINAGE;  Surgeon: Thera Flake., MD;  Location: [facility];  Service: Orthopedics;  Laterality: Right;  . KNEE ARTHROSCOPY WITH PATELLA RECONSTRUCTION Right 08/06/2013   Procedure: RIGHT KNEE ARTHROSCOPY WITH MENISCECTOMY MEDIAL, ARTHROSCOPY KNEE WITH DEBRIDEMENT/SHAVING (CHONDROPLASTY) ;  Surgeon: Thera Flake., MD;  Location: [facility];  Service: Orthopedics;  Laterality: Right;  . NASAL SINUS SURGERY     x4  . OOPHORECTOMY    . SHOULDER SURGERY  2011   right shoulder  . THYROID SURGERY  1994  . TONSILLECTOMY Bilateral 1974   Social History   Social History Narrative  . Not on file    There is no immunization history on file for this patient.   Objective: Vital Signs: BP 118/78 (BP Location: Left Arm, Patient Position: Sitting, Cuff Size: Normal)   Pulse 75   Resp 15   Ht 5\' 4"  (1.626 m)   Wt 167 lb 3.2 oz (75.8 kg)   BMI 28.70 kg/m    Physical Exam Vitals and nursing note reviewed.  Constitutional:      Appearance: She is well-developed.  HENT:     Head: Normocephalic and atraumatic.  Eyes:     Conjunctiva/sclera: Conjunctivae normal.  Cardiovascular:     Rate and Rhythm: Normal rate and regular rhythm.     Heart sounds: Normal heart sounds.  Pulmonary:     Effort: Pulmonary effort is normal.     Breath sounds: Normal breath sounds.  Abdominal:     General: Bowel sounds are normal.      Palpations: Abdomen is soft.  Musculoskeletal:     Cervical back: Normal range of motion.  Lymphadenopathy:     Cervical: No cervical adenopathy.  Skin:    General: Skin is warm and dry.     Capillary Refill: Capillary refill takes less than 2 seconds.  Neurological:     Mental Status: She is alert and oriented to person, place, and time.  Psychiatric:        Behavior: Behavior normal.      Musculoskeletal Exam: C-spine was in good range of motion.  She good range of motion of bilateral shoulders, elbows, wrist joints.  She has DIP thickening but no synovitis was noted.  Hip joints and knee joints ankles and MTPs were in good range of motion with no synovitis.  She had tenderness on palpation of the right trochanteric bursa consistent with trochanteric  bursitis.  CDAI Exam: CDAI Score: 0.6  Patient Global: 3 mm; Provider Global: 3 mm Swollen: 0 ; Tender: 0  Joint Exam 07/12/2019   No joint exam has been documented for this visit   There is currently no information documented on the homunculus. Go to the Rheumatology activity and complete the homunculus joint exam.  Investigation: No additional findings.  Imaging: US THYROID  Result Date: 07/12/2019 CLINICAL DATA:  Multiple thyroid nodules, previous left thyroid fine-needle aspiration 2014 EXAM: THYROID ULTRASOUND TECHNIQUE: Ultrasound examination of the thyroid gland and adjacent soft tissues was performed. COMPARISON:  07/24/2018 FINDINGS: Parenchymal Echotexture: Moderately heterogenous Isthmus: 3 mm Right lobe: 4.6 x 1.3 x 1.8 cm Left lobe: 4.9 x 1.3 x 1.3 cm _________________________________________________________ Estimated total number of nodules >/= 1 cm: 2 Number of spongiform nodules >/=  2 cm not described below (TR1): 0 Number of mixed cystic and solid nodules >/= 1.5 cm not described below (TR2): 0 _________________________________________________________ There are stable subcentimeter cystic, isoechoic and hypoechoic  nodules bilaterally, all measuring 8 mm or less in size. These would not meet criteria for any biopsy or follow-up. The previously biopsied left mid thyroid TR 4 nodule measures 1.3 x 1.2 x 1.0 cm, previously 1.7 x 1.3 x 1.1 cm. Correlate with prior pathology. The previously biopsied left inferior thyroid TR 4 nodule measures 1.7 x 1.5 x 1.3 cm, previously 1.8 x 1.8 x 1.2 cm. No significant change. Also correlate with pathology. No new nodule demonstrated. No hypervascularity or regional adenopathy. IMPRESSION: Stable subcentimeter scattered bilateral thyroid nodules. Stable previously biopsied left mid thyroid and left inferior thyroid TR 4 nodules. Correlate with prior pathology. Currently no continued follow-up or biopsy is indicated. The above is in keeping with the ACR TI-RADS recommendations - J Am Coll Radiol 2017;14:587-595. Electronically Signed   By: Jerilynn Mages.  Shick M.D.   On: 07/12/2019 12:36    Recent Labs: Lab Results  Component Value Date   WBC 12.8 (H) 04/27/2019   HGB 13.8 04/27/2019   PLT 350 04/27/2019   NA 140 04/27/2019   K 4.3 04/27/2019   CL 105 04/27/2019   CO2 25 04/27/2019   GLUCOSE 92 04/27/2019   BUN 24 04/27/2019   CREATININE 0.72 04/27/2019   BILITOT 0.2 04/27/2019   ALKPHOS 59 11/04/2016   AST 20 04/27/2019   ALT 27 04/27/2019   PROT 6.3 04/27/2019   ALBUMIN 4.0 11/04/2016   CALCIUM 10.1 04/27/2019   GFRAA 104 04/27/2019    Speciality Comments: PLQ Eye Exam: 11/09/18 WNL @ [facility] follow up in 12 months  Procedures:  Large Joint Inj: R greater trochanter on 07/12/2019 1:37 PM Indications: pain Details: 27 G 1.5 in needle, lateral approach  Arthrogram: No  Medications: 40 mg triamcinolone acetonide 40 MG/ML; 1.5 mL lidocaine 1 % Aspirate: 0 mL Outcome: tolerated well, no immediate complications Procedure, treatment alternatives, risks and benefits explained, specific risks discussed. Consent was given by the patient. Immediately prior to  procedure a time out was called to verify the correct patient, procedure, equipment, support staff and site/side marked as required. Patient was prepped and draped in the usual sterile fashion.     Allergies: Sulfa antibiotics, Sulfa drugs cross reactors, Codeine, Erythromycin, Metronidazole, and Morphine and related   Assessment / Plan:     Visit Diagnoses: Rheumatoid arthritis of multiple sites with negative rheumatoid factor (HCC) - RF-, CCP-, 14-3-3 eta positive: She had no synovitis on examination.  She is taking Plaquenil on a  regular basis.  High risk medication use - PLQ 200 mg 1 tablet BID M-F. PLQ Eye Exam: 11/09/18 WNL @ [facility] follow up in 12 months.  She has been reminded of having repeat eye examination in September this year.  Her labs were normal.  Her next labs will be due in August.  Trochanteric bursitis of right hip-she has severe tenderness over right trochanteric bursa radiating to her right lower extremity.  After different treatment options discussed right trochanteric bursa was injected with cortisone as described above.  She tolerated the procedure well.  IT band exercises were discussed.  She is diabetic she states her blood sugars have been running normal.  Have advised her to monitor blood sugars closely.  A handout on IT band exercises was given.  Primary osteoarthritis of both hands-she has some DIP thickening but not having much discomfort.  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  Chronic SI joint pain-doing better currently.  DDD (degenerative disc disease), lumbar-she has chronic discomfort.  Fibromyalgia-she is having a flare of fibromyalgia with generalized pain and discomfort.  Other fatigue-related to fibromyalgia.  Other medical problems are listed as follows:  History of positive PPD  History of gastroesophageal reflux (GERD)  History of thyroid  nodule  History of tics  History of sleep apnea  History of renal calculi  History of diabetes mellitus  History of migraine  History of PCOS  History of depression  Orders: Orders Placed This Encounter  Procedures  . Large Joint Inj   No orders of the defined types were placed in this encounter.    Follow-Up Instructions: Return in about 5 months (around 12/12/2019) for Rheumatoid arthritis, Osteoarthritis.   Pollyann Savoy, MD  Note - This record has been created using Animal nutritionist.  Chart creation errors have been sought, but may not always  have been located. Such creation errors do not reflect on  the standard of medical care.

## 2019-07-12 ENCOUNTER — Other Ambulatory Visit: Payer: Self-pay

## 2019-07-12 ENCOUNTER — Ambulatory Visit (INDEPENDENT_AMBULATORY_CARE_PROVIDER_SITE_OTHER): Payer: Medicare Other | Admitting: Rheumatology

## 2019-07-12 ENCOUNTER — Ambulatory Visit
Admission: RE | Admit: 2019-07-12 | Discharge: 2019-07-12 | Disposition: A | Discharge status: Home / Self Care | Payer: Medicare Other | Source: Ambulatory Visit | Attending: Endocrinology | Admitting: Endocrinology

## 2019-07-12 ENCOUNTER — Encounter: Payer: Self-pay | Admitting: Rheumatology

## 2019-07-12 VITALS — BP 118/78 | HR 75 | Resp 15 | Ht 64.0 in | Wt 167.2 lb

## 2019-07-12 DIAGNOSIS — M5136 Other intervertebral disc degeneration, lumbar region: Secondary | ICD-10-CM

## 2019-07-12 DIAGNOSIS — Z8669 Personal history of other diseases of the nervous system and sense organs: Secondary | ICD-10-CM

## 2019-07-12 DIAGNOSIS — M797 Fibromyalgia: Secondary | ICD-10-CM

## 2019-07-12 DIAGNOSIS — Z79899 Other long term (current) drug therapy: Secondary | ICD-10-CM | POA: Diagnosis not present

## 2019-07-12 DIAGNOSIS — R5383 Other fatigue: Secondary | ICD-10-CM

## 2019-07-12 DIAGNOSIS — M7061 Trochanteric bursitis, right hip: Secondary | ICD-10-CM

## 2019-07-12 DIAGNOSIS — Z8639 Personal history of other endocrine, nutritional and metabolic disease: Secondary | ICD-10-CM

## 2019-07-12 DIAGNOSIS — M1711 Unilateral primary osteoarthritis, right knee: Secondary | ICD-10-CM

## 2019-07-12 DIAGNOSIS — Z8719 Personal history of other diseases of the digestive system: Secondary | ICD-10-CM

## 2019-07-12 DIAGNOSIS — M0609 Rheumatoid arthritis without rheumatoid factor, multiple sites: Secondary | ICD-10-CM

## 2019-07-12 DIAGNOSIS — G8929 Other chronic pain: Secondary | ICD-10-CM

## 2019-07-12 DIAGNOSIS — M51369 Other intervertebral disc degeneration, lumbar region without mention of lumbar back pain or lower extremity pain: Secondary | ICD-10-CM

## 2019-07-12 DIAGNOSIS — Z87442 Personal history of urinary calculi: Secondary | ICD-10-CM

## 2019-07-12 DIAGNOSIS — Z9289 Personal history of other medical treatment: Secondary | ICD-10-CM

## 2019-07-12 DIAGNOSIS — M19042 Primary osteoarthritis, left hand: Secondary | ICD-10-CM

## 2019-07-12 DIAGNOSIS — M19041 Primary osteoarthritis, right hand: Secondary | ICD-10-CM | POA: Diagnosis not present

## 2019-07-12 DIAGNOSIS — Z8659 Personal history of other mental and behavioral disorders: Secondary | ICD-10-CM

## 2019-07-12 DIAGNOSIS — Z8742 Personal history of other diseases of the female genital tract: Secondary | ICD-10-CM

## 2019-07-12 DIAGNOSIS — M533 Sacrococcygeal disorders, not elsewhere classified: Secondary | ICD-10-CM

## 2019-07-12 DIAGNOSIS — E049 Nontoxic goiter, unspecified: Secondary | ICD-10-CM

## 2019-07-12 MED ORDER — TRIAMCINOLONE ACETONIDE 40 MG/ML IJ SUSP
40.0000 mg | INTRAMUSCULAR | Status: AC | PRN
  Administered 2019-07-12: 40 mg via INTRA_ARTICULAR

## 2019-07-12 MED ORDER — LIDOCAINE HCL 1 % IJ SOLN
1.5000 mL | INTRAMUSCULAR | Status: AC | PRN
  Administered 2019-07-12: 1.5 mL

## 2019-07-12 NOTE — Patient Instructions (Addendum)
Iliotibial Band Syndrome Rehab Ask your health care provider which exercises are safe for you. Do exercises exactly as told by your health care provider and adjust them as directed. It is normal to feel mild stretching, pulling, tightness, or discomfort as you do these exercises. Stop right away if you feel sudden pain or your pain gets significantly worse. Do not begin these exercises until told by your health care provider. Stretching and range-of-motion exercises These exercises warm up your muscles and joints and improve the movement and flexibility of your hip and pelvis. Quadriceps stretch, prone  1. Lie on your abdomen on a firm surface, such as a bed or padded floor (prone position). 2. Bend your left / right knee and reach back to hold your ankle or pant leg. If you cannot reach your ankle or pant leg, loop a belt around your foot and grab the belt instead. 3. Gently pull your heel toward your buttocks. Your knee should not slide out to the side. You should feel a stretch in the front of your thigh and knee (quadriceps). 4. Hold this position for __________ seconds. Repeat __________ times. Complete this exercise __________ times a day. Iliotibial band stretch An iliotibial band is a strong band of muscle tissue that runs from the outer side of your hip to the outer side of your thigh and knee. 1. Lie on your side with your left / right leg in the top position. 2. Bend both of your knees and grab your left / right ankle. Stretch out your bottom arm to help you balance. 3. Slowly bring your top knee back so your thigh goes behind your trunk. 4. Slowly lower your top leg toward the floor until you feel a gentle stretch on the outside of your left / right hip and thigh. If you do not feel a stretch and your knee will not fall farther, place the heel of your other foot on top of your knee and pull your knee down toward the floor with your foot. 5. Hold this position for __________  seconds. Repeat __________ times. Complete this exercise __________ times a day. Strengthening exercises These exercises build strength and endurance in your hip and pelvis. Endurance is the ability to use your muscles for a long time, even after they get tired. Straight leg raises, side-lying This exercise strengthens the muscles that rotate the leg at the hip and move it away from your body (hip abductors). 1. Lie on your side with your left / right leg in the top position. Lie so your head, shoulder, hip, and knee line up. You may bend your bottom knee to help you balance. 2. Roll your hips slightly forward so your hips are stacked directly over each other and your left / right knee is facing forward. 3. Tense the muscles in your outer thigh and lift your top leg 4-6 inches (10-15 cm). 4. Hold this position for __________ seconds. 5. Slowly return to the starting position. Let your muscles relax completely before doing another repetition. Repeat __________ times. Complete this exercise __________ times a day. Leg raises, prone This exercise strengthens the muscles that move the hips (hip extensors). 1. Lie on your abdomen on your bed or a firm surface. You can put a pillow under your hips if that is more comfortable for your lower back. 2. Bend your left / right knee so your foot is straight up in the air. 3. Squeeze your buttocks muscles and lift your left / right thigh   off the bed. Do not let your back arch. 4. Tense your thigh muscle as hard as you can without increasing any knee pain. 5. Hold this position for __________ seconds. 6. Slowly lower your leg to the starting position and allow it to relax completely. Repeat __________ times. Complete this exercise __________ times a day. Hip hike 1. Stand sideways on a bottom step. Stand on your left / right leg with your other foot unsupported next to the step. You can hold on to the railing or wall for balance if needed. 2. Keep your knees  straight and your torso square. Then lift your left / right hip up toward the ceiling. 3. Slowly let your left / right hip lower toward the floor, past the starting position. Your foot should get closer to the floor. Do not lean or bend your knees. Repeat __________ times. Complete this exercise __________ times a day. This information is not intended to replace advice given to you by your health care provider. Make sure you discuss any questions you have with your health care provider. Document Revised: 06/01/2018 Document Reviewed: 11/30/2017 Elsevier Patient Education  2020 [employer]. Standing Labs We placed an order today for your standing lab work.    Please come back and get your standing labs in August  We have open lab daily Monday through Thursday from 8:30-12:30 PM and 1:30-4:30 PM and Friday from 8:30-12:30 PM and 1:30-4:00 PM at the office of Dr. Pollyann Savoy.   You may experience shorter wait times on Monday and Friday afternoons. The office is located at [address], [address], [place], [place] [address] No appointment is necessary.   Labs are drawn by [facility].  You may receive a bill from [facility] for your lab work.  If you wish to have your labs drawn at another location, please call the office 24 hours in advance to send orders.  If you have any questions regarding directions or hours of operation,  please call [phone].   Just as a reminder please drink plenty of water prior to coming for your lab work. Thanks!

## 2019-07-31 ENCOUNTER — Ambulatory Visit (INDEPENDENT_AMBULATORY_CARE_PROVIDER_SITE_OTHER): Payer: Medicare Other | Admitting: Rheumatology

## 2019-07-31 ENCOUNTER — Ambulatory Visit: Payer: Self-pay

## 2019-07-31 ENCOUNTER — Other Ambulatory Visit: Payer: Self-pay

## 2019-07-31 ENCOUNTER — Encounter: Payer: Self-pay | Admitting: Rheumatology

## 2019-07-31 VITALS — BP 121/78 | HR 85 | Resp 16 | Ht 64.0 in | Wt 164.4 lb

## 2019-07-31 DIAGNOSIS — M19041 Primary osteoarthritis, right hand: Secondary | ICD-10-CM | POA: Diagnosis not present

## 2019-07-31 DIAGNOSIS — M0609 Rheumatoid arthritis without rheumatoid factor, multiple sites: Secondary | ICD-10-CM

## 2019-07-31 DIAGNOSIS — R5383 Other fatigue: Secondary | ICD-10-CM

## 2019-07-31 DIAGNOSIS — Z79899 Other long term (current) drug therapy: Secondary | ICD-10-CM

## 2019-07-31 DIAGNOSIS — Z8669 Personal history of other diseases of the nervous system and sense organs: Secondary | ICD-10-CM

## 2019-07-31 DIAGNOSIS — Z9289 Personal history of other medical treatment: Secondary | ICD-10-CM

## 2019-07-31 DIAGNOSIS — M5441 Lumbago with sciatica, right side: Secondary | ICD-10-CM

## 2019-07-31 DIAGNOSIS — M7061 Trochanteric bursitis, right hip: Secondary | ICD-10-CM

## 2019-07-31 DIAGNOSIS — M5136 Other intervertebral disc degeneration, lumbar region: Secondary | ICD-10-CM

## 2019-07-31 DIAGNOSIS — Z8719 Personal history of other diseases of the digestive system: Secondary | ICD-10-CM

## 2019-07-31 DIAGNOSIS — Z8742 Personal history of other diseases of the female genital tract: Secondary | ICD-10-CM

## 2019-07-31 DIAGNOSIS — Z8659 Personal history of other mental and behavioral disorders: Secondary | ICD-10-CM

## 2019-07-31 DIAGNOSIS — M533 Sacrococcygeal disorders, not elsewhere classified: Secondary | ICD-10-CM | POA: Diagnosis not present

## 2019-07-31 DIAGNOSIS — M25551 Pain in right hip: Secondary | ICD-10-CM | POA: Diagnosis not present

## 2019-07-31 DIAGNOSIS — M1711 Unilateral primary osteoarthritis, right knee: Secondary | ICD-10-CM

## 2019-07-31 DIAGNOSIS — Z87442 Personal history of urinary calculi: Secondary | ICD-10-CM

## 2019-07-31 DIAGNOSIS — M19042 Primary osteoarthritis, left hand: Secondary | ICD-10-CM

## 2019-07-31 DIAGNOSIS — G8929 Other chronic pain: Secondary | ICD-10-CM

## 2019-07-31 DIAGNOSIS — M797 Fibromyalgia: Secondary | ICD-10-CM

## 2019-07-31 DIAGNOSIS — Z8639 Personal history of other endocrine, nutritional and metabolic disease: Secondary | ICD-10-CM

## 2019-07-31 DIAGNOSIS — M51369 Other intervertebral disc degeneration, lumbar region without mention of lumbar back pain or lower extremity pain: Secondary | ICD-10-CM

## 2019-07-31 NOTE — Patient Instructions (Signed)
Standing Labs We placed an order today for your standing lab work.    Please come back and get your standing labs in august and every 5 months   We have open lab daily Monday through Thursday from 8:30-12:30 PM and 1:30-4:30 PM and Friday from 8:30-12:30 PM and 1:30-4:00 PM at the office of Dr. Pollyann Savoy.   You may experience shorter wait times on Monday and Friday afternoons. The office is located at [address], [place], [place] [address] No appointment is necessary.   Labs are drawn by [facility].  You may receive a bill from [facility] for your lab work.  If you wish to have your labs drawn at another location, please call the office 24 hours in advance to send orders.  If you have any questions regarding directions or hours of operation,  please call [phone].   Just as a reminder please drink plenty of water prior to coming for your lab work. Thanks!

## 2019-07-31 NOTE — Progress Notes (Signed)
Office Visit Note  Patient: Christina Lewis             Date of Birth: [birth year]           MRN: [id number]             PCP: Kirstie Peri, MD Referring: Kirstie Peri, MD Visit Date: 07/31/2019 Occupation: @GUAROCC @  Subjective:  Low back pain   History of Present Illness: Christina Lewis is a 64 y.o. female with history of seronegative rheumatoid arthritis, osteoarthritis, and fibromyalgia.  Patient is taking Plaquenil 200 mg 1 tablet by mouth twice daily Monday through Friday.  She is tolerating Plaquenil without any side effects.  She denies any recent rheumatoid arthritis flares.  She is not having any discomfort in her hands at this time. Patient reports that 1 month ago she tripped over her cat and fell on her right knee.  She continues have discomfort in her right knee and has been wearing a compression sleeve for comfort.  She has been experiencing increased lower back pain and right lower extremity pain.  She has been experiencing significant nocturnal pain.  She states that the pain radiates all the way down to her right great toe.  She is also had some intermittent discomfort in the right groin.  Her discomfort has been nonresponsive to over-the-counter products.  She continues to have generalized myalgias and muscle tenderness due to fibromyalgia.  Her fatigue has increased recently since she has not been sleeping well due to nocturnal pain.   Activities of Daily Living:  Patient reports joint stiffness all day  Patient Reports nocturnal pain.  Difficulty dressing/grooming: Denies Difficulty climbing stairs: Reports Difficulty getting out of chair: Reports Difficulty using hands for taps, buttons, cutlery, and/or writing: Reports  Review of Systems  Constitutional: Positive for fatigue.  HENT: Negative for mouth sores, mouth dryness and nose dryness.   Eyes: Negative for itching and dryness.  Respiratory: Negative for shortness of breath and difficulty breathing.     Cardiovascular: Negative for chest pain and palpitations.  Gastrointestinal: Negative for blood in stool, constipation and diarrhea.  Endocrine: Negative for increased urination.  Genitourinary: Negative for difficulty urinating.  Musculoskeletal: Positive for arthralgias, joint pain, joint swelling, myalgias, morning stiffness, muscle tenderness and myalgias.  Skin: Positive for color change. Negative for rash and redness.  Allergic/Immunologic: Negative for susceptible to infections.  Neurological: Positive for numbness, headaches and weakness. Negative for dizziness and memory loss.  Hematological: Negative for bruising/bleeding tendency.  Psychiatric/Behavioral: Negative for confusion.    PMFS History:  Patient Active Problem List   Diagnosis Date Noted  . Leukocytosis 11/04/2016  . Fibromyalgia 02/10/2016  . Other fatigue 02/10/2016  . Primary osteoarthritis of both hands 02/10/2016  . History of migraine 02/10/2016  . History of thyroid nodule 02/10/2016  . History of sleep apnea 02/10/2016  . History of renal calculi 02/10/2016  . Pain in joint, shoulder region 10/02/2010  . Adhesive capsulitis of shoulder 10/02/2010  . Muscle weakness (generalized) 10/02/2010    Past Medical History:  Diagnosis Date  . Arthritis   . Asthma 2007  . Diabetes mellitus   . Fibromyalgia   . Gout   . Hematuria   . History of tics   . Interstitial cystitis 2016  . Migraines   . Osteoarthritis   . PCOS (polycystic ovarian syndrome) 1993  . PONV (postoperative nausea and vomiting)   . Sleep apnea   . 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 Thyroid nodule     Family History  Problem Relation Age of Onset  . Fibromyalgia Mother   . Psoriasis Mother        psoriatic arthritis   . Diabetes Mother   . Heart disease Mother   . Psoriasis Sister        psoriatic arthritis   . Diabetes Sister   . Rheum arthritis Sister   . Fibromyalgia  Sister   . Diabetes Sister    Past Surgical History:  Procedure Laterality Date  . ABDOMINAL HYSTERECTOMY  2000  . APPENDECTOMY  1976  . BREAST LUMPECTOMY  1989   lt-negative  . CARDIOVASCULAR STRESS TEST  2000  . CHOLECYSTECTOMY  1985  . COLONOSCOPY    . HYSTERECTOMY ABDOMINAL WITH SALPINGECTOMY Bilateral 2000  . KIDNEY STONE SURGERY Right 2014   with stent placement in OR  . KNEE ARTHROSCOPY Right 08/22/2013   Procedure: ARTHROSCOPY RIGHT KNEE FOR INFECTION LAVAGE AND DRAINAGE;  Surgeon: Thera Flake., MD;  Location: [facility];  Service: Orthopedics;  Laterality: Right;  . KNEE ARTHROSCOPY WITH PATELLA RECONSTRUCTION Right 08/06/2013   Procedure: RIGHT KNEE ARTHROSCOPY WITH MENISCECTOMY MEDIAL, ARTHROSCOPY KNEE WITH DEBRIDEMENT/SHAVING (CHONDROPLASTY) ;  Surgeon: Thera Flake., MD;  Location: [facility];  Service: Orthopedics;  Laterality: Right;  . NASAL SINUS SURGERY     x4  . OOPHORECTOMY    . SHOULDER SURGERY  2011   right shoulder  . THYROID SURGERY  1994  . TONSILLECTOMY Bilateral 1974   Social History   Social History Narrative  . Not on file    There is no immunization history on file for this patient.   Objective: Vital Signs: BP 121/78 (BP Location: Left Arm, Patient Position: Sitting, Cuff Size: Normal)   Pulse 85   Resp 16   Ht 5\' 4"  (1.626 m)   Wt 164 lb 6.4 oz (74.6 kg)   BMI 28.22 kg/m    Physical Exam Vitals and nursing note reviewed.  Constitutional:      Appearance: She is well-developed.  HENT:     Head: Normocephalic and atraumatic.  Eyes:     Conjunctiva/sclera: Conjunctivae normal.  Pulmonary:     Effort: Pulmonary effort is normal.  Abdominal:     General: Bowel sounds are normal.     Palpations: Abdomen is soft.  Musculoskeletal:     Cervical back: Normal range of motion.  Lymphadenopathy:     Cervical: No cervical adenopathy.  Skin:    General: Skin is warm and dry.     Capillary Refill:  Capillary refill takes less than 2 seconds.  Neurological:     Mental Status: She is alert and oriented to person, place, and time.  Psychiatric:        Behavior: Behavior normal.      Musculoskeletal Exam: C-spine, thoracic spine, lumbar spine have good range of motion.  Midline spinal tenderness in the lumbar region.  She has tenderness over the right SI joint.  Shoulder joints, elbow joints, wrist joints, MCPs, PIPs, DIPs have good range of motion with no synovitis.  She has complete fist formation bilaterally.  She has DIP prominence consistent with osteoarthritis of both hands.  Right hip has slightly limited range of motion with discomfort.  No tenderness over the right trochanteric bursa.  Left hip has good range of motion with no discomfort at this time.  Both knees have good  range of motion with no warmth or effusion.  Ankle joints have good range of motion with no tenderness or inflammation.  No tenderness of MTP joints.  Hammertoes noted.  CDAI Exam: CDAI Score: 0.4  Patient Global: 2 mm; Provider Global: 2 mm Swollen: 0 ; Tender: 0  Joint Exam 07/31/2019   No joint exam has been documented for this visit   There is currently no information documented on the homunculus. Go to the Rheumatology activity and complete the homunculus joint exam.  Investigation: No additional findings.  Imaging: US THYROID  Result Date: 07/12/2019 CLINICAL DATA:  Multiple thyroid nodules, previous left thyroid fine-needle aspiration 2014 EXAM: THYROID ULTRASOUND TECHNIQUE: Ultrasound examination of the thyroid gland and adjacent soft tissues was performed. COMPARISON:  07/24/2018 FINDINGS: Parenchymal Echotexture: Moderately heterogenous Isthmus: 3 mm Right lobe: 4.6 x 1.3 x 1.8 cm Left lobe: 4.9 x 1.3 x 1.3 cm _________________________________________________________ Estimated total number of nodules >/= 1 cm: 2 Number of spongiform nodules >/=  2 cm not described below (TR1): 0 Number of mixed cystic  and solid nodules >/= 1.5 cm not described below (TR2): 0 _________________________________________________________ There are stable subcentimeter cystic, isoechoic and hypoechoic nodules bilaterally, all measuring 8 mm or less in size. These would not meet criteria for any biopsy or follow-up. The previously biopsied left mid thyroid TR 4 nodule measures 1.3 x 1.2 x 1.0 cm, previously 1.7 x 1.3 x 1.1 cm. Correlate with prior pathology. The previously biopsied left inferior thyroid TR 4 nodule measures 1.7 x 1.5 x 1.3 cm, previously 1.8 x 1.8 x 1.2 cm. No significant change. Also correlate with pathology. No new nodule demonstrated. No hypervascularity or regional adenopathy. IMPRESSION: Stable subcentimeter scattered bilateral thyroid nodules. Stable previously biopsied left mid thyroid and left inferior thyroid TR 4 nodules. Correlate with prior pathology. Currently no continued follow-up or biopsy is indicated. The above is in keeping with the ACR TI-RADS recommendations - J Am Coll Radiol 2017;14:587-595. Electronically Signed   By: Jerilynn Mages.  Shick M.D.   On: 07/12/2019 12:36   XR HIP UNILAT W OR W/O PELVIS 2-3 VIEWS RIGHT  Result Date: 07/31/2019 Mild superior lateral joint space narrowing was noted.  No SI joint changes were noted. Impression: Mild osteoarthritis of the hip joint was noted.  XR Lumbar Spine 2-3 Views  Result Date: 07/31/2019 Dextroscoliosis with L3-4 spondylolisthesis was noted.  Multilevel spondylosis with most severe narrowing L2-L3, and L3-4 was noted.  Facet joint arthropathy with foraminal narrowing was noted. Impression: Dextroscoliosis with multilevel spondylosis and facet joint arthropathy was noted.   Recent Labs: Lab Results  Component Value Date   WBC 12.8 (H) 04/27/2019   HGB 13.8 04/27/2019   PLT 350 04/27/2019   NA 140 04/27/2019   K 4.3 04/27/2019   CL 105 04/27/2019   CO2 25 04/27/2019   GLUCOSE 92 04/27/2019   BUN 24 04/27/2019   CREATININE 0.72 04/27/2019    BILITOT 0.2 04/27/2019   ALKPHOS 59 11/04/2016   AST 20 04/27/2019   ALT 27 04/27/2019   PROT 6.3 04/27/2019   ALBUMIN 4.0 11/04/2016   CALCIUM 10.1 04/27/2019   GFRAA 104 04/27/2019    Speciality Comments: PLQ Eye Exam: 07/31/2019 @ [facility]   Procedures:  No procedures performed Allergies: Sulfa antibiotics, Sulfa drugs cross reactors, Codeine, Erythromycin, Metronidazole, and Morphine and related   Assessment / Plan:     Visit Diagnoses: Rheumatoid arthritis of multiple sites with negative rheumatoid factor (HCC) - RF-, CCP-,  14-3-3 eta positive: She has no tenderness or synovitis on exam today.  She has not had any recent rheumatoid arthritis flares.  She is clinically doing well on Plaquenil 200 mg 1 tablet by mouth twice daily Monday-Friday only.  She has not missed any doses and is tolerating it without any side effects.  She has no discomfort in her hands or wrist joints at this time.  She will continue taking plaquenil as prescribed.  She was advised to notify us if she develops increased joint pain or joint swelling.  She will follow up in 5 months.   High risk medication use - PLQ 200 mg 1 tablet BID M-F. PLQ Eye Exam: 11/09/18.  CBC and CMP were drawn on 04/27/2019.  She will be due to update lab work in August and every 5 months to monitor for drug toxicity.  Standing orders for CBC and CMP replace today  Trochanteric bursitis of right hip: Resolved.  She had a cortisone injection performed on 07/12/2019 which resolved her discomfort.  She has no tenderness to palpation on exam today.  Primary osteoarthritis of both hands: She has DIP thickening consistent with osteoarthritis of both hands.  No tenderness or inflammation was noted on exam.  She has complete fist formation bilaterally.  Joint protection and muscle strengthening were discussed.  Primary osteoarthritis of right knee: She continues to have discomfort in the right knee joint after falling on her right knee  1 month ago.  She has good ROM of the right knee joint.  No warmth or effusion noted. She is using a right knee compression sleeve.   Chronic midline low back pain with right-sided sciatica -She presents today with increased pain in her lower back.  She is having right sided radiculopathy radiating to her right great toe.  X-rays of the lumbar spine were obtained today.  We will proceed with a MRI of the lumbar spine due to the severity of her pain and symptoms of right sided radiculopathy. Plan: XR Lumbar Spine 2-3 Views  Pain in right hip -She has painful and limited ROM of the right hip joint on exam.  X-rays of the right hip were obtained which revealed superolateral joint narrowing consistent with osteoarthritis.  Plan: XR HIP UNILAT W OR W/O PELVIS 2-3 VIEWS RIGHT  Chronic SI joint pain -She has tenderness over the right SI joint on exam today. X-rays of lumbar spine and right hip were obtained today.  Plan: XR Lumbar Spine 2-3 Views, XR HIP UNILAT W OR W/O PELVIS 2-3 VIEWS RIGHT  DDD (degenerative disc disease), lumbar: She present today with acute on chronic pain of her lower back.  She has been experiencing right sided radiculopathy for the past 1 month.  She had a fall one month and has had discomfort since then.  She has tried OTC products for pain relief, which were ineffective.  X-rays of the lumbar spine were obtained today. She has multilevel spondylosis with severe narrowing at L2-L3 and L3-L4. We will proceed with ordering a MRI of the lumbar spine due to the severity of her discomfort. She has been having nocturnal pain and difficulty walking at times due to the right sided radiculopathy.  She has not had any response to OTC products for pain relief.   Fibromyalgia -She has ongoing myalgias and muscle tenderness due to underlying fibromyalgia.  She experiences muscle spasms intermittently.  She takes baclofen 10 mg 1 tablet twice daily as needed for muscle spasms and Zanaflex 4 mg  by  mouth at bedtime for muscle spasms.  She is taking Cymbalta 60 mg 2 capsules daily.  She has been experiencing increased fatigue secondary to nocturnal pain.  She has been having increased lower back pain for the past 1 month, which has been causing interrupted sleep at night.  We discussed the importance of regular exercise and good sleep hygiene.    Other fatigue: She has been experiencing increased fatigue secondary to insomnia.  She has had worsening nocturnal pain which has been contributing to her insomnia.  Other medical conditions are listed as follows:   History of gastroesophageal reflux (GERD)  History of positive PPD  History of renal calculi  History of tics  History of thyroid nodule  History of sleep apnea  History of PCOS  History of migraine  History of depression  History of diabetes mellitus    Orders: Orders Placed This Encounter  Procedures  . XR Lumbar Spine 2-3 Views  . XR HIP UNILAT W OR W/O PELVIS 2-3 VIEWS RIGHT  . MR LUMBAR SPINE WO CONTRAST  . CBC with Differential/Platelet  . COMPLETE METABOLIC PANEL WITH GFR   No orders of the defined types were placed in this encounter.   Face-to-face time spent with patient was 30 minutes. Greater than 50% of time was spent in counseling and coordination of care.  Follow-Up Instructions: Return in about 5 months (around 12/31/2019) for Rheumatoid arthritis.   Sherron Ales, PA-C  I examined and evaluated the patient with Sherron Ales PA.  Patient is doing well as regards to her rheumatoid arthritis.  She had no synovitis.  She has been having lower back pain with right-sided radiculopathy.  She has severe scoliosis and multilevel spondylosis.  We will schedule MRI of the lumbar spine to evaluate this further.  The plan of care was discussed as noted above.  Pollyann Savoy, MD  Note - This record has been created using [employer].  Chart creation errors have been sought, but may not always  have  been located. Such creation errors do not reflect on  the standard of medical care.

## 2019-08-05 ENCOUNTER — Other Ambulatory Visit: Payer: Self-pay | Admitting: Rheumatology

## 2019-08-06 NOTE — Telephone Encounter (Signed)
Last visit: 07/31/2019 Next visit: 01/01/2020  Last Fill: 04/27/2019 (tizanidine) 06/27/2019 (baclofen)  Okay to refill tizanidine and baclofen?

## 2019-08-08 ENCOUNTER — Telehealth: Payer: Self-pay | Admitting: Rheumatology

## 2019-08-08 NOTE — Telephone Encounter (Signed)
I spoke with Rockne Menghini nurse for peer review.  I discussed the finding of physical examination and the x-ray.  MRI of the lumbar spine has been approved.  Authorization number is [id number].  The authorization is valid until 01/29/2020 at [facility] office. Pollyann Savoy, MD

## 2019-08-08 NOTE — Telephone Encounter (Signed)
Okay to schedule peer to peer.

## 2019-08-08 NOTE — Telephone Encounter (Signed)
MRI has not been approved stating more information is needed. Doctor can do a Peer to Peer by calling [facility] at [phone], follow prompts. Give them pt ID # [id number] DOB, and Address. MRI for ([id number]) Lspine.  Peer to Peer needs to be done today before case is closed.

## 2019-08-13 ENCOUNTER — Ambulatory Visit (INDEPENDENT_AMBULATORY_CARE_PROVIDER_SITE_OTHER): Payer: BC Managed Care – PPO | Admitting: Gastroenterology

## 2019-08-21 ENCOUNTER — Other Ambulatory Visit: Payer: Self-pay | Admitting: Rheumatology

## 2019-08-21 DIAGNOSIS — M0609 Rheumatoid arthritis without rheumatoid factor, multiple sites: Secondary | ICD-10-CM

## 2019-08-21 NOTE — Telephone Encounter (Signed)
Last Visit: 07/31/2019 Next Visit: 01/01/2020  Labs: 04/27/2019 WBC count is mildly elevated but trending down. CMP WNL. PLQ Eye Exam: 07/31/2019   Current Dose per office note 07/31/2019: PLQ 200 mg 1 tablet BID M-F. DX: Rheumatoid arthritis   Okay to refill PLQ?

## 2019-08-30 ENCOUNTER — Telehealth: Payer: Self-pay | Admitting: *Deleted

## 2019-08-30 NOTE — Telephone Encounter (Signed)
Received MRI results and they were reviewed by Dr. Corliss Skains. Patient advised results show negative for lumbar fracture. Advised ti shows Scoliosis and multilevel degenerative changes throughout the lumber spine. The dominant finding is an extruded disc fragment on right at L3-4 with up going disc material and impingement of the right L3 nerve root. Dr. Corliss Skains recommends seeing a neurosurgeon.   Patient advised of results and recommendations. Patient sees Dr. Maeola Harman and has an appointment on 09/03/2019. Copy of results faxed to Dr. Venetia Maxon.

## 2019-09-04 ENCOUNTER — Other Ambulatory Visit: Payer: Self-pay | Admitting: Neurosurgery

## 2019-09-06 NOTE — Pre-Procedure Instructions (Signed)
Christina Lewis  09/06/2019    Your procedure is scheduled on Tuesday, September 11, 2019 at 7:30 AM.   Report to [facility] Entrance "A" Admitting Office at 5:30 AM.   Call this number if you have problems the morning of surgery: [phone]   Questions prior to day of surgery, please call [phone] between 8 & 4 PM.   Remember:  Do not eat or drink after midnight Monday, 09/10/19.  Take these medicines the morning of surgery with A SIP OF WATER: Baclofen (Lioresal), Duloxetine (Cymbalta), Hydroxychloroquine (Plaquenil), Omeprazole (Prilosec), Topiramate (Topamax), Fioricet - if needed, Acetaminophen (Tylenol) - if needed. Flonase nasal spray, may use eye drops if needed  Stop Aspirin, NSAIDS (Ibuprofen, Aleve, Diclofenac, etc), Herbal medications and Multivitamins. Do not use Fish Oil prior to surgeyr.    Do not wear jewelry, make-up or nail polish.  Do not wear lotions, powders, perfumes or deodorant.  Do not shave 48 hours prior to surgery.   Do not bring valuables to the hospital.  [facility] is not responsible for any belongings or valuables.  Contacts, dentures or bridgework may not be worn into surgery.  Leave your suitcase in the car.  After surgery it may be brought to your room.  For patients admitted to the hospital, discharge time will be determined by your treatment team.  Patients discharged the day of surgery will not be allowed to drive home.   [facility] - Preparing for Surgery  Before surgery, you can play an important role.  Because skin is not sterile, your skin needs to be as free of germs as possible.  You can reduce the number of germs on you skin by washing with CHG (chlorahexidine gluconate) soap before surgery.  CHG is an antiseptic cleaner which kills germs and bonds with the skin to continue killing germs even after washing.  Oral Hygiene is also important in reducing the risk of infection.  Remember to brush your teeth with your regular  toothpaste the morning of surgery.  Please DO NOT use if you have an allergy to CHG or antibacterial soaps.  If your skin becomes reddened/irritated stop using the CHG and inform your nurse when you arrive at Short Stay.  Do not shave (including legs and underarms) for at least 48 hours prior to the first CHG shower.  You may shave your face.  Please follow these instructions carefully:   1.  Shower with CHG Soap the night before surgery and the morning of Surgery.  2.  If you choose to wash your hair, wash your hair first as usual with your normal shampoo.  3.  After you shampoo, rinse your hair and body thoroughly to remove the shampoo. 4.  Use CHG as you would any other liquid soap.  You can apply chg directly to the skin and wash gently with a      scrungie or washcloth.           5.  Apply the CHG Soap to your body ONLY FROM THE NECK DOWN.   Do not use on open wounds or open sores. Avoid contact with your eyes, ears, mouth and genitals (private parts).  Wash genitals (private parts) with your normal soap - do this prior to using CHG soap.  6.  Wash thoroughly, paying special attention to the area where your surgery will be performed.  7.  Thoroughly rinse your body with warm water from the neck down.  8.  DO NOT shower/wash  with your normal soap after using and rinsing off the CHG Soap.  9.  Pat yourself dry with a clean towel.            10.  Wear clean pajamas.            11.  Place clean sheets on your bed the night of your first shower and do not sleep with pets.  Day of Surgery  Shower as above. Do not apply any lotions/deodorants the morning of surgery.   Please wear clean clothes to the hospital. Remember to brush your teeth with toothpaste.  Please read over the fact sheets that you were given.

## 2019-09-07 ENCOUNTER — Encounter (HOSPITAL_COMMUNITY): Payer: Self-pay

## 2019-09-07 ENCOUNTER — Other Ambulatory Visit: Payer: Self-pay

## 2019-09-07 ENCOUNTER — Other Ambulatory Visit (HOSPITAL_COMMUNITY)
Admission: RE | Admit: 2019-09-07 | Discharge: 2019-09-07 | Disposition: A | Discharge status: Home / Self Care | Payer: Medicare Other | Source: Ambulatory Visit | Attending: Neurosurgery | Admitting: Neurosurgery

## 2019-09-07 ENCOUNTER — Encounter (HOSPITAL_COMMUNITY)
Admission: RE | Admit: 2019-09-07 | Discharge: 2019-09-07 | Disposition: A | Discharge status: Home / Self Care | Payer: Medicare Other | Source: Ambulatory Visit | Attending: Neurosurgery | Admitting: Neurosurgery

## 2019-09-07 DIAGNOSIS — Z20822 Contact with and (suspected) exposure to covid-19: Secondary | ICD-10-CM | POA: Diagnosis not present

## 2019-09-07 DIAGNOSIS — Z01818 Encounter for other preprocedural examination: Secondary | ICD-10-CM | POA: Diagnosis not present

## 2019-09-07 DIAGNOSIS — E118 Type 2 diabetes mellitus with unspecified complications: Secondary | ICD-10-CM | POA: Insufficient documentation

## 2019-09-07 HISTORY — DX: Depression, unspecified: F32.A

## 2019-09-07 HISTORY — DX: Personal history of urinary calculi: Z87.442

## 2019-09-07 HISTORY — DX: Gastro-esophageal reflux disease without esophagitis: K21.9

## 2019-09-07 LAB — BASIC METABOLIC PANEL
Anion gap: 8 (ref 5–15)
BUN: 19 mg/dL (ref 8–23)
CO2: 23 mmol/L (ref 22–32)
Calcium: 9.5 mg/dL (ref 8.9–10.3)
Chloride: 111 mmol/L (ref 98–111)
Creatinine, Ser: 0.81 mg/dL (ref 0.44–1.00)
GFR calc Af Amer: >60 mL/min (ref >60)
GFR calc non Af Amer: >60 mL/min (ref >60)
Glucose, Bld: 102 mg/dL — ABNORMAL HIGH (ref 70–99)
Potassium: 4 mmol/L (ref 3.5–5.1)
Sodium: 142 mmol/L (ref 135–145)

## 2019-09-07 LAB — SURGICAL PCR SCREEN
MRSA, PCR: NEGATIVE
Staphylococcus aureus: POSITIVE — AB

## 2019-09-07 LAB — HEMOGLOBIN A1C
Hgb A1c MFr Bld: 5.7 % — ABNORMAL HIGH (ref 4.8–5.6)
Mean Plasma Glucose: 116.89 mg/dL

## 2019-09-07 LAB — CBC
HCT: 41.8 % (ref 36.0–46.0)
Hemoglobin: 13.5 g/dL (ref 12.0–15.0)
MCH: 30.2 pg (ref 26.0–34.0)
MCHC: 32.3 g/dL (ref 30.0–36.0)
MCV: 93.5 fL (ref 80.0–100.0)
Platelets: 337 10*3/uL (ref 150–400)
RBC: 4.47 MIL/uL (ref 3.87–5.11)
RDW: 13.5 % (ref 11.5–15.5)
WBC: 9.6 10*3/uL (ref 4.0–10.5)
nRBC: 0 % (ref 0.0–0.2)

## 2019-09-07 LAB — GLUCOSE, CAPILLARY: Glucose-Capillary: 96 mg/dL (ref 70–99)

## 2019-09-07 NOTE — Progress Notes (Signed)
PCP - Kirstie Peri, MD Cardiologist - Denies Endocrinologist- Dorisann Frames, MD  PPM/ICD - Denies  Chest x-ray - N/A EKG - 09/07/19 Stress Test - Per patient, in 2001 (at [facility]) and 2009 (at Dr. Margaretmary Eddy office) both to r/o CP. Per patient, both tests were negative, without need to follow-up with cardiology. ECHO - 2001; per pt, to r/o CP; done at [facility]. Cardiac Cath - Denies  Sleep Study - Yes, positive for OSA CPAP - No  Fasting Blood Sugar: 80-110 Checks Blood Sugar ~q 3 weeks. CBG at PAT appointment was 96. A1C Obtained.  Blood Thinner Instructions: N/A Aspirin Instructions: N/A  ERAS Protcol - N/A PRE-SURGERY Ensure or G2- N/A  COVID TEST- 09/07/19   Anesthesia review: No  Patient denies shortness of breath, fever, cough and chest pain at PAT appointment   All instructions explained to the patient, with a verbal understanding of the material. Patient agrees to go over the instructions while at home for a better understanding. Patient also instructed to self quarantine after being tested for COVID-19. The opportunity to ask questions was provided.

## 2019-09-08 LAB — SARS CORONAVIRUS 2 (TAT 6-24 HRS): SARS Coronavirus 2: NEGATIVE

## 2019-09-10 NOTE — Anesthesia Preprocedure Evaluation (Addendum)
Anesthesia Evaluation  Patient identified by MRN, date of birth, ID band Patient awake    Reviewed: Allergy & Precautions, H&P , NPO status , Patient's Chart, lab work & pertinent test results  History of Anesthesia Complications (+) PONV  Airway Mallampati: III  TM Distance: >3 FB Neck ROM: Full    Dental no notable dental hx. (+) Chipped, Dental Advisory Given   Pulmonary asthma , sleep apnea ,    Pulmonary exam normal breath sounds clear to auscultation       Cardiovascular Exercise Tolerance: Good negative cardio ROS   Rhythm:Regular Rate:Normal     Neuro/Psych  Headaches, Depression    GI/Hepatic Neg liver ROS, GERD  ,  Endo/Other  diabetes  Renal/GU negative Renal ROS  negative genitourinary   Musculoskeletal  (+) Arthritis , Fibromyalgia -  Abdominal   Peds  Hematology negative hematology ROS (+)   Anesthesia Other Findings   Reproductive/Obstetrics negative OB ROS                            Anesthesia Physical Anesthesia Plan  ASA: II  Anesthesia Plan: General   Post-op Pain Management:    Induction: Intravenous  PONV Risk Score and Plan: 4 or greater and Ondansetron, Dexamethasone and Midazolam  Airway Management Planned: Oral ETT  Additional Equipment:   Intra-op Plan:   Post-operative Plan: Extubation in OR  Informed Consent: I have reviewed the patients History and Physical, chart, labs and discussed the procedure including the risks, benefits and alternatives for the proposed anesthesia with the patient or authorized representative who has indicated his/her understanding and acceptance.     Dental advisory given  Plan Discussed with: CRNA  Anesthesia Plan Comments:         Anesthesia Quick Evaluation

## 2019-09-10 NOTE — H&P (Signed)
Patient ID:   [id number] Patient: Christina Lewis  Date of Birth: 15-Jun-1955 Visit Type: Office Visit   Date: 09/03/2019 02:30 PM Provider: Danae Orleans. Venetia Maxon MD   This 64 year old female presents for lower back pain.  HISTORY OF PRESENT ILLNESS:  1.  lower back pain  Christina Lewis, 64 year old disabled female, returns for evaluation.  She was last seen in 2017. Patient recalls tripping over her cat May 5th and experiencing right knee pain.  Primary care offered a steroid injection and Dosepak which offered some pain relief in the knee.  Her rheumatologist Dr. Corliss Skains, offered right hip bursa injection which also provided significant relief.  Unfortunately, right buttock and right leg pain persisted including numbness and tingling in the right leg and foot.  Currently, she notes pain with standing and pain with sitting more than 30 minutes.  Tylenol or Advil taken only p.r.n.  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, migraines, hematuria, polycystic ovary syndrome, sleep apnea with CPAP, diabetes, OA, fibromyalgia, asthma, GERD, interstitial cystitis Surgical history:  Tonsillectomy 1974, appendectomy 1976, renal stent 2004, deviated septum repair 1984, cholecystectomy 1987, left breast lumpectomy 1989, thyroid cyst aspiration 1994, biopsies 2013, 2014; sinus surgeries 1994, 2003, 2004, 2005; hysterectomy salpingo-oophorectomy 2001, right knee meniscus repair with follow-up surgery for infection June 2015  Lumbar MRI and x-ray on canopy  Lumbar radiographs show mild scoliosis to the right, more marked at L3-4 level.  Her MRI shows multilevel degenerative changes throughout the lumbar spine but with a disc herniation at L3-4 on the right which is causing canal and foraminal stenosis.  There appears to be an extruded fragment of herniated disc material on the right.  This is causing significant nerve root compression.  Patient describes that she has pain  with stand and cannot stand more than 10 minutes at a. She can sit for less 30 minutes at a time.         Medical/Surgical/Interim History Reviewed, no change.  Last detailed document date:04/21/2015.     PAST MEDICAL HISTORY, SURGICAL HISTORY, FAMILY HISTORY, SOCIAL HISTORY AND REVIEW OF SYSTEMS I have reviewed the patient's past medical, surgical, family and social history as well as the comprehensive review of systems as included on the [facility] history form dated 08/30/2019, which I have signed.  Family History:  Reviewed, no changes.  Last detailed document date:04/21/2015.   Social History: Reviewed, no changes. Last detailed document date: 04/21/2015.    MEDICATIONS: (added, continued or stopped this visit) Started Medication Directions Instruction Stopped   Calcium 500 500 mg calcium (1,250 mg) tablet take 1 tablet daily     FaBB 2.2 mg-25 mg-1 mg tablet take 1 tablet by oral route  every day     Fish Oil 1,000 mg (120 mg-180 mg) capsule take 1 by oral route  every day     Flonase 50 mcg/actuation nasal spray,suspension inhale 1 spray by intranasal route  every day in each nostril     Gabapentin take 1200 mg twice daily     iron take 100 mg daily     Lexapro 20 mg tablet take 1 tablet by oral route  every day     Lidoderm 5 % topical patch apply 1 patch by transdermal route  every day (May wear up to 12hours.)     loratadine 10 mg tablet take 1 tablet by oral route  every day     multivitamin take 1  tablet daily     nabumetone 500 mg tablet take 1 tablet by oral route 2 times every day    05/19/2015 Neurontin 300 mg capsule take 1 capsule by oral route 3 times every day     Niaspan 500 mg tablet,extended release take 1 tablet by oral route  every day at bedtime after a low-fat snack     Provigil 200 mg tablet take 2 tablet by oral route  every day in the morning     Risperdal 0.25 mg tablet take 1 tablet by oral route  every day      spironolactone 50 mg tablet take 1 tablet by oral route  every day    04/21/2015 tizanidine 4 mg tablet take 2 tablet by oral route  1 - 3 times every day     tramadol 50 mg tablet take 1 tablet by oral route  every 6 hours as needed     Treximet 10 mg-60 mg tablet take one tablet as needed for severe migraines     verapamil ER 120 mg 24 hr capsule,extended release take 1 capsule by oral route  every day     vitamin d  ORAL take 1000 mg daily    04/21/2015 Voltaren 1 % topical gel apply (2G)  by topical route 4 times every day to the affected area(s)       ALLERGIES: Ingredient Reaction Medication Name Comment  ERYTHROMYCIN BASE Unknown    CODEINE Unknown    SULFA (SULFONAMIDE ANTIBIOTICS) Unknown     Reviewed, no changes.   REVIEW OF SYSTEMS   See scanned patient registration form, dated 08/30/2019, signed and dated on 09/04/2019  Review of Systems Details System Neg/Pos Details  Constitutional Negative Chills, Fatigue, Fever, Malaise, Night sweats, Weight gain and Weight loss.  ENMT Negative Ear drainage, Hearing loss, Nasal drainage, Otalgia, Sinus pressure and Sore throat.  Eyes Negative Eye discharge, Eye pain and Vision changes.  Respiratory Negative Chronic cough, Cough, Dyspnea, Known TB exposure and Wheezing.  Cardio Negative Chest pain, Claudication, Edema and Irregular heartbeat/palpitations.  GI Negative Abdominal pain, Blood in stool, Change in stool pattern, Constipation, Decreased appetite, Diarrhea, Heartburn, Nausea and Vomiting.  GU Negative Dysuria, Hematuria, Polyuria (Genitourinary), Urinary frequency, Urinary incontinence and Urinary retention.  Endocrine Negative Cold intolerance, Heat intolerance, Polydipsia and Polyphagia.  Neuro Negative Dizziness, Extremity weakness, Gait disturbance, Headache, Memory impairment, Numbness in extremity, Seizures and Tremors.  Psych Negative Anxiety, Depression and Insomnia.  Integumentary Negative Brittle hair, Brittle  nails, Change in shape/size of mole(s), Hair loss, Hirsutism, Hives, Pruritus, Rash and Skin lesion.  MS Positive Back pain.  Hema/Lymph Negative Easy bleeding, Easy bruising and Lymphadenopathy.  Allergic/Immuno Negative Contact allergy, Environmental allergies, Food allergies and Seasonal allergies.  Reproductive Negative Breast discharge, Breast lumps, Dysmenorrhea, Dyspareunia, History of abnormal PAP smear, Hot flashes, Irregular menses and Vaginal discharge.   PHYSICAL EXAM:   Vitals Date Temp F BP Pulse Ht In Wt Lb BMI BSA Pain Score  09/03/2019  118/79 79 64 163.4 28.05  2/10    PHYSICAL EXAM Details General Level of Distress: no acute distress Overall Appearance: normal  Head and Face  Right Left  Fundoscopic Exam:  normal normal    Cardiovascular Cardiac: regular rate and rhythm without murmur  Right Left  Carotid Pulses: normal normal  Respiratory Lungs: clear to auscultation  Neurological Orientation: normal Recent and Remote Memory: normal Attention Span and Concentration:   normal Language: normal Fund of Knowledge: normal  Right Left  Sensation: normal normal Upper Extremity Coordination: normal normal  Lower Extremity Coordination: normal normal  Musculoskeletal Gait and Station: normal  Right Left Upper Extremity Muscle Strength: normal normal Lower Extremity Muscle Strength: normal normal Upper Extremity Muscle Tone:  normal normal Lower Extremity Muscle Tone: normal normal   Motor Strength Upper and lower extremity motor strength was tested in the clinically pertinent muscles. Any abnormal findings will be noted below.   Right Left Knee Extensor: 4+/5    Deep Tendon Reflexes  Right Left Biceps: normal normal Triceps: normal normal Brachioradialis: normal normal Patellar: normal normal Achilles: normal normal  Sensory Sensation was tested at L1 to S1. Any abnormal findings will be noted  below.  Right Left L3: decreased   L4: decreased    Cranial Nerves II. Optic Nerve/Visual Fields: normal III. Oculomotor: normal IV. Trochlear: normal V. Trigeminal: normal VI. Abducens: normal VII. Facial: normal VIII. Acoustic/Vestibular: normal IX. Glossopharyngeal: normal X. Vagus: normal XI. Spinal Accessory: normal XII. Hypoglossal: normal  Motor and other Tests Lhermittes: negative Rhomberg: negative Pronator drift: absent     Right Left Hoffman's: normal normal Clonus: normal normal Babinski: normal normal SLR: positive at 40 degrees negative Patrick's Pearlean Brownie): negative negative Toe Walk: normal normal Toe Lift: normal normal Heel Walk: normal normal SI Joint: nontender nontender   Additional Findings:  Patient is unable to squat on her right leg and is easily able to do so on the left.  She is able to bend to within 8 in of the floor with her upper extremities outstretched.  She is able stand on her heels and toes.  She has significant right sciatic notch discomfort to palpation    IMPRESSION:   The patient has mild scoliosis at multiple levels in the lumbar spine has a new extruded disc fragment on the right at the L3-4 level.  We talked about injections or surgery but given the fact that the patient has weakness in her leg and unrelenting pain with an extruded disc fragment, I have counseled her to pursue surgical intervention.  This will consist of right L3-4 microdiskectomy.  I told her that she is at risk for having increasing collapse of this disc level and progression of scoliotic nerve root compression but that a micro diskectomy might be the most effective way to relieve pain.  If she has recurrent disc herniation we will need to consider a more extensive surgery and or fusion operation.  PLAN:  The patient wishes to proceed with surgery.  This will consist of right L3-4 microdiskectomy.  Risks and benefits were discussed in detail with the patient and  she wishes to proceed with surgery.  Detailed patient education was performed today  Orders: Instruction(s)/Education: Assessment Instruction  [phone] Dietary management education, guidance, and counseling   Completed Orders (this encounter) Order Details Reason Side Interpretation Result Initial Treatment Date Region  Dietary management education, guidance, and counseling Encouraged patient to eat well balanced diet.         Assessment/Plan   # Detail Type Description   1. Assessment Low back pain, unspecified back pain laterality, with sciatica presence unspecified (M54.5).       2. 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       3. Assessment Radiculopathy, lumbar region (M54.16).       4. Assessment Herniated nucleus pulposus, lumbar (M51.26).       5. Assessment Body mass index (BMI) 28.0-28.9, adult (K24.09).   Plan Orders Today's instructions / counseling include(s) Dietary management  education, guidance, and counseling. Clinical information/comments: Encouraged patient to eat well balanced diet.         Pain Management Plan Pain Scale: 2/10. Method: Numeric Pain Intensity Scale. Location: back pain. Onset: 01/19/2015. Duration: varies. Quality: discomforting. Pain management follow-up plan of care: Patient will continue medication management..              Provider:  Danae Orleans. Venetia Maxon MD  09/05/2019 08:17 AM    Dictation edited by: Danae Orleans. Venetia Maxon    CC Providers: PCP  None   [facility] [address] [place], [place] [address]               Electronically signed by Danae Orleans Venetia Maxon MD on 09/05/2019 08:17 AM

## 2019-09-11 ENCOUNTER — Encounter (HOSPITAL_COMMUNITY)
Admission: RE | Disposition: A | Discharge status: Home / Self Care | Payer: Self-pay | Source: Home / Self Care | Attending: Neurosurgery

## 2019-09-11 ENCOUNTER — Ambulatory Visit (HOSPITAL_COMMUNITY): Payer: Medicare Other | Admitting: Anesthesiology

## 2019-09-11 ENCOUNTER — Ambulatory Visit (HOSPITAL_COMMUNITY): Payer: Medicare Other

## 2019-09-11 ENCOUNTER — Observation Stay (HOSPITAL_COMMUNITY)
Admission: RE | Admit: 2019-09-11 | Discharge: 2019-09-11 | Disposition: A | Discharge status: Home / Self Care | Payer: Medicare Other | Attending: Neurosurgery | Admitting: Neurosurgery

## 2019-09-11 ENCOUNTER — Encounter (HOSPITAL_COMMUNITY): Payer: Self-pay | Admitting: Neurosurgery

## 2019-09-11 ENCOUNTER — Other Ambulatory Visit: Payer: Self-pay

## 2019-09-11 ENCOUNTER — Ambulatory Visit (HOSPITAL_COMMUNITY): Payer: Medicare Other | Admitting: Vascular Surgery

## 2019-09-11 ENCOUNTER — Other Ambulatory Visit: Payer: Self-pay | Admitting: Rheumatology

## 2019-09-11 DIAGNOSIS — Z6828 Body mass index (BMI) 28.0-28.9, adult: Secondary | ICD-10-CM | POA: Insufficient documentation

## 2019-09-11 DIAGNOSIS — M48061 Spinal stenosis, lumbar region without neurogenic claudication: Secondary | ICD-10-CM | POA: Diagnosis not present

## 2019-09-11 DIAGNOSIS — M5126 Other intervertebral disc displacement, lumbar region: Secondary | ICD-10-CM | POA: Diagnosis present

## 2019-09-11 DIAGNOSIS — M5416 Radiculopathy, lumbar region: Secondary | ICD-10-CM | POA: Diagnosis not present

## 2019-09-11 DIAGNOSIS — M4126 Other idiopathic scoliosis, lumbar region: Secondary | ICD-10-CM | POA: Diagnosis not present

## 2019-09-11 DIAGNOSIS — E119 Type 2 diabetes mellitus without complications: Secondary | ICD-10-CM | POA: Diagnosis not present

## 2019-09-11 DIAGNOSIS — Z419 Encounter for procedure for purposes other than remedying health state, unspecified: Secondary | ICD-10-CM

## 2019-09-11 DIAGNOSIS — J45909 Unspecified asthma, uncomplicated: Secondary | ICD-10-CM | POA: Insufficient documentation

## 2019-09-11 HISTORY — PX: LUMBAR LAMINECTOMY/DECOMPRESSION MICRODISCECTOMY: SHX5026

## 2019-09-11 LAB — GLUCOSE, CAPILLARY
Glucose-Capillary: 119 mg/dL — ABNORMAL HIGH (ref 70–99)
Glucose-Capillary: 121 mg/dL — ABNORMAL HIGH (ref 70–99)

## 2019-09-11 SURGERY — LUMBAR LAMINECTOMY/DECOMPRESSION MICRODISCECTOMY 1 LEVEL
Anesthesia: General | Site: Spine Lumbar | Laterality: Right

## 2019-09-11 MED ORDER — METHYLPREDNISOLONE ACETATE 80 MG/ML IJ SUSP
INTRAMUSCULAR | Status: AC
  Filled 2019-09-11: qty 1

## 2019-09-11 MED ORDER — PROPOFOL 10 MG/ML IV BOLUS
INTRAVENOUS | Status: AC
  Filled 2019-09-11: qty 20

## 2019-09-11 MED ORDER — HYDROCODONE-ACETAMINOPHEN 5-325 MG PO TABS: 1.0000 | ORAL_TABLET | ORAL | 0 refills | Status: DC | PRN

## 2019-09-11 MED ORDER — PANTOPRAZOLE SODIUM 40 MG IV SOLR: 40.0000 mg | Freq: Every day | INTRAVENOUS | Status: DC

## 2019-09-11 MED ORDER — LIDOCAINE-EPINEPHRINE 1 %-1:100000 IJ SOLN
INTRAMUSCULAR | Status: DC | PRN
  Administered 2019-09-11: 5 mL

## 2019-09-11 MED ORDER — CHLORHEXIDINE GLUCONATE CLOTH 2 % EX PADS: 6.0000 | MEDICATED_PAD | Freq: Once | CUTANEOUS | Status: DC

## 2019-09-11 MED ORDER — BUTALBITAL-APAP-CAFFEINE 50-325-40 MG PO TABS: 1.0000 | ORAL_TABLET | Freq: Two times a day (BID) | ORAL | Status: DC | PRN

## 2019-09-11 MED ORDER — FENTANYL CITRATE (PF) 100 MCG/2ML IJ SOLN
INTRAMUSCULAR | Status: AC
  Filled 2019-09-11: qty 2

## 2019-09-11 MED ORDER — METHOCARBAMOL 1000 MG/10ML IJ SOLN
500.0000 mg | Freq: Four times a day (QID) | INTRAVENOUS | Status: DC | PRN
  Filled 2019-09-11: qty 5

## 2019-09-11 MED ORDER — VITAMIN D3 25 MCG (1000 UNIT) PO TABS: 1000.0000 [IU] | ORAL_TABLET | Freq: Every day | ORAL | Status: DC

## 2019-09-11 MED ORDER — HYDROMORPHONE HCL 1 MG/ML IJ SOLN
INTRAMUSCULAR | Status: AC
  Filled 2019-09-11: qty 1

## 2019-09-11 MED ORDER — THROMBIN 5000 UNITS EX SOLR
CUTANEOUS | Status: AC
  Filled 2019-09-11: qty 5000

## 2019-09-11 MED ORDER — MIDAZOLAM HCL 2 MG/2ML IJ SOLN
INTRAMUSCULAR | Status: DC | PRN
  Administered 2019-09-11: 2 mg via INTRAVENOUS

## 2019-09-11 MED ORDER — BISACODYL 10 MG RE SUPP: 10.0000 mg | Freq: Every day | RECTAL | Status: DC | PRN

## 2019-09-11 MED ORDER — HYDROXYZINE HCL 50 MG/ML IM SOLN
50.0000 mg | Freq: Four times a day (QID) | INTRAMUSCULAR | Status: DC | PRN
  Administered 2019-09-11: 50 mg via INTRAMUSCULAR
  Filled 2019-09-11: qty 1

## 2019-09-11 MED ORDER — DICLOFENAC SODIUM 1 % EX GEL
2.0000 g | Freq: Four times a day (QID) | CUTANEOUS | Status: DC | PRN
  Filled 2019-09-11: qty 100

## 2019-09-11 MED ORDER — DEXAMETHASONE SODIUM PHOSPHATE 10 MG/ML IJ SOLN
INTRAMUSCULAR | Status: DC | PRN
  Administered 2019-09-11: 10 mg via INTRAVENOUS

## 2019-09-11 MED ORDER — CEFAZOLIN SODIUM-DEXTROSE 2-4 GM/100ML-% IV SOLN: 2.0000 g | Freq: Three times a day (TID) | INTRAVENOUS | Status: DC

## 2019-09-11 MED ORDER — IBUPROFEN 200 MG PO TABS: 400.0000 mg | ORAL_TABLET | Freq: Three times a day (TID) | ORAL | Status: DC | PRN

## 2019-09-11 MED ORDER — ACETAMINOPHEN 500 MG PO TABS
1000.0000 mg | ORAL_TABLET | Freq: Once | ORAL | Status: DC
  Filled 2019-09-11: qty 2

## 2019-09-11 MED ORDER — 0.9 % SODIUM CHLORIDE (POUR BTL) OPTIME
TOPICAL | Status: DC | PRN
  Administered 2019-09-11: 1000 mL

## 2019-09-11 MED ORDER — MONTELUKAST SODIUM 10 MG PO TABS
10.0000 mg | ORAL_TABLET | Freq: Every day | ORAL | Status: DC
  Filled 2019-09-11: qty 1

## 2019-09-11 MED ORDER — FERROUS SULFATE 325 (65 FE) MG PO TABS: 325.0000 mg | ORAL_TABLET | Freq: Every day | ORAL | Status: DC

## 2019-09-11 MED ORDER — PANTOPRAZOLE SODIUM 40 MG PO TBEC: 40.0000 mg | DELAYED_RELEASE_TABLET | Freq: Every day | ORAL | Status: DC

## 2019-09-11 MED ORDER — ALUM & MAG HYDROXIDE-SIMETH 200-200-20 MG/5ML PO SUSP: 30.0000 mL | Freq: Four times a day (QID) | ORAL | Status: DC | PRN

## 2019-09-11 MED ORDER — FENTANYL CITRATE (PF) 100 MCG/2ML IJ SOLN
INTRAMUSCULAR | Status: DC | PRN
  Administered 2019-09-11: 100 ug via INTRAVENOUS

## 2019-09-11 MED ORDER — CEFAZOLIN SODIUM-DEXTROSE 2-4 GM/100ML-% IV SOLN
2.0000 g | INTRAVENOUS | Status: AC
  Administered 2019-09-11: 2 g via INTRAVENOUS
  Filled 2019-09-11: qty 100

## 2019-09-11 MED ORDER — BUPIVACAINE HCL (PF) 0.5 % IJ SOLN
INTRAMUSCULAR | Status: DC | PRN
  Administered 2019-09-11: 5 mL

## 2019-09-11 MED ORDER — ACETAMINOPHEN 650 MG RE SUPP: 650.0000 mg | RECTAL | Status: DC | PRN

## 2019-09-11 MED ORDER — ONDANSETRON HCL 4 MG/2ML IJ SOLN
INTRAMUSCULAR | Status: AC
  Filled 2019-09-11: qty 2

## 2019-09-11 MED ORDER — ROCURONIUM BROMIDE 10 MG/ML (PF) SYRINGE
PREFILLED_SYRINGE | INTRAVENOUS | Status: DC | PRN
  Administered 2019-09-11: 10 mg via INTRAVENOUS
  Administered 2019-09-11: 50 mg via INTRAVENOUS

## 2019-09-11 MED ORDER — FENTANYL CITRATE (PF) 250 MCG/5ML IJ SOLN
INTRAMUSCULAR | Status: DC | PRN
  Administered 2019-09-11: 100 ug via INTRAVENOUS
  Administered 2019-09-11: 50 ug via INTRAVENOUS

## 2019-09-11 MED ORDER — ZOLPIDEM TARTRATE 5 MG PO TABS: 5.0000 mg | ORAL_TABLET | Freq: Every evening | ORAL | Status: DC | PRN

## 2019-09-11 MED ORDER — ONDANSETRON HCL 4 MG/2ML IJ SOLN: 4.0000 mg | Freq: Four times a day (QID) | INTRAMUSCULAR | Status: DC | PRN

## 2019-09-11 MED ORDER — DIPHENHYDRAMINE HCL 50 MG/ML IJ SOLN
INTRAMUSCULAR | Status: AC
  Filled 2019-09-11: qty 1

## 2019-09-11 MED ORDER — POLYETHYLENE GLYCOL 3350 17 G PO PACK: 17.0000 g | PACK | Freq: Every day | ORAL | Status: DC | PRN

## 2019-09-11 MED ORDER — KETOTIFEN FUMARATE 0.025 % OP SOLN: 1.0000 [drp] | Freq: Two times a day (BID) | OPHTHALMIC | Status: DC | PRN

## 2019-09-11 MED ORDER — METHYLPREDNISOLONE ACETATE 80 MG/ML IJ SUSP
INTRAMUSCULAR | Status: DC | PRN
  Administered 2019-09-11: 80 mg

## 2019-09-11 MED ORDER — MIDAZOLAM HCL 2 MG/2ML IJ SOLN
INTRAMUSCULAR | Status: AC
  Filled 2019-09-11: qty 2

## 2019-09-11 MED ORDER — LIDOCAINE 2% (20 MG/ML) 5 ML SYRINGE
INTRAMUSCULAR | Status: AC
  Filled 2019-09-11: qty 5

## 2019-09-11 MED ORDER — DULOXETINE HCL 30 MG PO CPEP: 120.0000 mg | ORAL_CAPSULE | Freq: Every day | ORAL | Status: DC

## 2019-09-11 MED ORDER — SODIUM CHLORIDE 0.9% FLUSH: 3.0000 mL | INTRAVENOUS | Status: DC | PRN

## 2019-09-11 MED ORDER — ONDANSETRON HCL 4 MG/2ML IJ SOLN
INTRAMUSCULAR | Status: DC | PRN
  Administered 2019-09-11: 4 mg via INTRAVENOUS

## 2019-09-11 MED ORDER — ASPIRIN EC 81 MG PO TBEC: 81.0000 mg | DELAYED_RELEASE_TABLET | Freq: Every day | ORAL | Status: DC

## 2019-09-11 MED ORDER — LIDOCAINE-EPINEPHRINE 1 %-1:100000 IJ SOLN
INTRAMUSCULAR | Status: AC
  Filled 2019-09-11: qty 1

## 2019-09-11 MED ORDER — CHLORHEXIDINE GLUCONATE 0.12 % MT SOLN
15.0000 mL | Freq: Once | OROMUCOSAL | Status: AC
  Administered 2019-09-11: 15 mL via OROMUCOSAL
  Filled 2019-09-11: qty 15

## 2019-09-11 MED ORDER — ONDANSETRON HCL 4 MG PO TABS: 4.0000 mg | ORAL_TABLET | Freq: Four times a day (QID) | ORAL | Status: DC | PRN

## 2019-09-11 MED ORDER — SUGAMMADEX SODIUM 200 MG/2ML IV SOLN
INTRAVENOUS | Status: DC | PRN
  Administered 2019-09-11: 300 mg via INTRAVENOUS

## 2019-09-11 MED ORDER — SODIUM CHLORIDE 0.9 % IV SOLN: 250.0000 mL | INTRAVENOUS | Status: DC

## 2019-09-11 MED ORDER — SCOPOLAMINE 1 MG/3DAYS TD PT72
MEDICATED_PATCH | TRANSDERMAL | Status: AC
  Filled 2019-09-11: qty 1

## 2019-09-11 MED ORDER — FLUTICASONE PROPIONATE 50 MCG/ACT NA SUSP
1.0000 | Freq: Every day | NASAL | Status: DC
  Filled 2019-09-11: qty 16

## 2019-09-11 MED ORDER — ACETAMINOPHEN 500 MG PO TABS: 500.0000 mg | ORAL_TABLET | Freq: Four times a day (QID) | ORAL | Status: DC | PRN

## 2019-09-11 MED ORDER — FENTANYL CITRATE (PF) 250 MCG/5ML IJ SOLN
INTRAMUSCULAR | Status: AC
  Filled 2019-09-11: qty 5

## 2019-09-11 MED ORDER — KCL IN DEXTROSE-NACL 20-5-0.45 MEQ/L-%-% IV SOLN: INTRAVENOUS | Status: DC

## 2019-09-11 MED ORDER — DOCUSATE SODIUM 100 MG PO CAPS
100.0000 mg | ORAL_CAPSULE | Freq: Two times a day (BID) | ORAL | Status: DC
  Administered 2019-09-11: 100 mg via ORAL
  Filled 2019-09-11: qty 1

## 2019-09-11 MED ORDER — ONDANSETRON 4 MG PO TBDP: 8.0000 mg | ORAL_TABLET | Freq: Three times a day (TID) | ORAL | Status: DC | PRN

## 2019-09-11 MED ORDER — ELETRIPTAN HYDROBROMIDE 40 MG PO TABS
40.0000 mg | ORAL_TABLET | Freq: Two times a day (BID) | ORAL | Status: DC | PRN
  Filled 2019-09-11: qty 1

## 2019-09-11 MED ORDER — MENTHOL 3 MG MT LOZG: 1.0000 | LOZENGE | OROMUCOSAL | Status: DC | PRN

## 2019-09-11 MED ORDER — ACETAMINOPHEN 325 MG PO TABS: 650.0000 mg | ORAL_TABLET | ORAL | Status: DC | PRN

## 2019-09-11 MED ORDER — TOPIRAMATE 25 MG PO TABS: 50.0000 mg | ORAL_TABLET | Freq: Two times a day (BID) | ORAL | Status: DC

## 2019-09-11 MED ORDER — ADULT MULTIVITAMIN W/MINERALS CH: 1.0000 | ORAL_TABLET | Freq: Every day | ORAL | Status: DC

## 2019-09-11 MED ORDER — HYDROMORPHONE HCL 1 MG/ML IJ SOLN
0.2500 mg | INTRAMUSCULAR | Status: DC | PRN
  Administered 2019-09-11 (×4): 0.5 mg via INTRAVENOUS

## 2019-09-11 MED ORDER — BACLOFEN 10 MG PO TABS
10.0000 mg | ORAL_TABLET | Freq: Two times a day (BID) | ORAL | Status: DC
  Administered 2019-09-11: 10 mg via ORAL
  Filled 2019-09-11: qty 1

## 2019-09-11 MED ORDER — DEXAMETHASONE SODIUM PHOSPHATE 10 MG/ML IJ SOLN
INTRAMUSCULAR | Status: AC
  Filled 2019-09-11: qty 1

## 2019-09-11 MED ORDER — DIPHENHYDRAMINE HCL 50 MG/ML IJ SOLN
12.5000 mg | Freq: Once | INTRAMUSCULAR | Status: AC
  Administered 2019-09-11: 12.5 mg via INTRAVENOUS

## 2019-09-11 MED ORDER — FLEET ENEMA 7-19 GM/118ML RE ENEM: 1.0000 | ENEMA | Freq: Once | RECTAL | Status: DC | PRN

## 2019-09-11 MED ORDER — HYDROCODONE-ACETAMINOPHEN 5-325 MG PO TABS: 1.0000 | ORAL_TABLET | ORAL | Status: DC | PRN

## 2019-09-11 MED ORDER — THROMBIN 5000 UNITS EX SOLR
OROMUCOSAL | Status: DC | PRN
  Administered 2019-09-11: 5 mL

## 2019-09-11 MED ORDER — TIZANIDINE HCL 4 MG PO TABS: 4.0000 mg | ORAL_TABLET | Freq: Every day | ORAL | Status: DC

## 2019-09-11 MED ORDER — METHOCARBAMOL 500 MG PO TABS: 500.0000 mg | ORAL_TABLET | Freq: Four times a day (QID) | ORAL | Status: DC | PRN

## 2019-09-11 MED ORDER — OXYCODONE HCL 5 MG PO TABS: 5.0000 mg | ORAL_TABLET | ORAL | Status: DC | PRN

## 2019-09-11 MED ORDER — TURMERIC-GINGER 150-25 MG PO CHEW: CHEWABLE_TABLET | Freq: Every day | ORAL | Status: DC

## 2019-09-11 MED ORDER — HYDROXYCHLOROQUINE SULFATE 200 MG PO TABS
200.0000 mg | ORAL_TABLET | ORAL | Status: DC
  Filled 2019-09-11: qty 1

## 2019-09-11 MED ORDER — BUPIVACAINE HCL (PF) 0.5 % IJ SOLN
INTRAMUSCULAR | Status: AC
  Filled 2019-09-11: qty 30

## 2019-09-11 MED ORDER — PHENOL 1.4 % MT LIQD: 1.0000 | OROMUCOSAL | Status: DC | PRN

## 2019-09-11 MED ORDER — LEVOCETIRIZINE DIHYDROCHLORIDE 5 MG PO TABS: 5.0000 mg | ORAL_TABLET | Freq: Every day | ORAL | Status: DC

## 2019-09-11 MED ORDER — SODIUM CHLORIDE 0.9% FLUSH: 3.0000 mL | Freq: Two times a day (BID) | INTRAVENOUS | Status: DC

## 2019-09-11 MED ORDER — LACTATED RINGERS IV SOLN: INTRAVENOUS | Status: DC

## 2019-09-11 MED ORDER — LIDOCAINE 2% (20 MG/ML) 5 ML SYRINGE
INTRAMUSCULAR | Status: DC | PRN
  Administered 2019-09-11: 60 mg via INTRAVENOUS

## 2019-09-11 MED ORDER — HYDROMORPHONE HCL 1 MG/ML IJ SOLN: 0.5000 mg | INTRAMUSCULAR | Status: DC | PRN

## 2019-09-11 MED ORDER — PROPOFOL 10 MG/ML IV BOLUS
INTRAVENOUS | Status: DC | PRN
  Administered 2019-09-11: 110 mg via INTRAVENOUS

## 2019-09-11 MED ORDER — SCOPOLAMINE 1 MG/3DAYS TD PT72
MEDICATED_PATCH | TRANSDERMAL | Status: DC | PRN
  Administered 2019-09-11: 1 via TRANSDERMAL

## 2019-09-11 MED ORDER — ORAL CARE MOUTH RINSE: 15.0000 mL | Freq: Once | OROMUCOSAL | Status: AC

## 2019-09-11 MED ORDER — MAGNESIUM 200 MG PO TABS: ORAL_TABLET | Freq: Every day | ORAL | Status: DC

## 2019-09-11 SURGICAL SUPPLY — 60 items
ADH SKN CLS APL DERMABOND .7 (GAUZE/BANDAGES/DRESSINGS) ×1
APL SKNCLS STERI-STRIP NONHPOA (GAUZE/BANDAGES/DRESSINGS) ×1
BAND INSRT 18 STRL LF DISP RB (MISCELLANEOUS) ×2
BAND RUBBER #18 3X1/16 STRL (MISCELLANEOUS) ×6 IMPLANT
BENZOIN TINCTURE PRP APPL 2/3 (GAUZE/BANDAGES/DRESSINGS) ×3 IMPLANT
BLADE CLIPPER SURG (BLADE) IMPLANT
BUR MATCHSTICK NEURO 3.0 LAGG (BURR) ×3 IMPLANT
BUR ROUND FLUTED 5 RND (BURR) ×2 IMPLANT
BUR ROUND FLUTED 5MM RND (BURR) ×1
CANISTER SUCT 3000ML PPV (MISCELLANEOUS) ×3 IMPLANT
CARTRIDGE OIL MAESTRO DRILL (MISCELLANEOUS) ×1 IMPLANT
CLOSURE WOUND 1/2 X4 (GAUZE/BANDAGES/DRESSINGS) ×1
COVER WAND RF STERILE (DRAPES) IMPLANT
DECANTER SPIKE VIAL GLASS SM (MISCELLANEOUS) ×3 IMPLANT
DERMABOND ADVANCED (GAUZE/BANDAGES/DRESSINGS) ×2
DERMABOND ADVANCED .7 DNX12 (GAUZE/BANDAGES/DRESSINGS) ×1 IMPLANT
DIFFUSER DRILL AIR PNEUMATIC (MISCELLANEOUS) ×3 IMPLANT
DRAPE LAPAROTOMY 100X72X124 (DRAPES) ×3 IMPLANT
DRAPE MICROSCOPE LEICA (MISCELLANEOUS) ×3 IMPLANT
DRAPE SURG 17X23 STRL (DRAPES) ×3 IMPLANT
DRSG OPSITE POSTOP 4X6 (GAUZE/BANDAGES/DRESSINGS) ×2 IMPLANT
DURAPREP 26ML APPLICATOR (WOUND CARE) ×3 IMPLANT
ELECT REM PT RETURN 9FT ADLT (ELECTROSURGICAL) ×3
ELECTRODE REM PT RTRN 9FT ADLT (ELECTROSURGICAL) ×1 IMPLANT
GAUZE 4X4 16PLY RFD (DISPOSABLE) IMPLANT
GAUZE SPONGE 4X4 12PLY STRL (GAUZE/BANDAGES/DRESSINGS) IMPLANT
GLOVE BIO SURGEON STRL SZ8 (GLOVE) ×3 IMPLANT
GLOVE BIOGEL PI IND STRL 8 (GLOVE) ×1 IMPLANT
GLOVE BIOGEL PI IND STRL 8.5 (GLOVE) ×1 IMPLANT
GLOVE BIOGEL PI INDICATOR 8 (GLOVE) ×2
GLOVE BIOGEL PI INDICATOR 8.5 (GLOVE) ×2
GLOVE ECLIPSE 8.0 STRL XLNG CF (GLOVE) ×3 IMPLANT
GLOVE EXAM NITRILE XL STR (GLOVE) IMPLANT
GOWN STRL REUS W/ TWL LRG LVL3 (GOWN DISPOSABLE) IMPLANT
GOWN STRL REUS W/ TWL XL LVL3 (GOWN DISPOSABLE) ×1 IMPLANT
GOWN STRL REUS W/TWL 2XL LVL3 (GOWN DISPOSABLE) ×3 IMPLANT
GOWN STRL REUS W/TWL LRG LVL3 (GOWN DISPOSABLE)
GOWN STRL REUS W/TWL XL LVL3 (GOWN DISPOSABLE) ×3
HEMOSTAT POWDER KIT SURGIFOAM (HEMOSTASIS) ×3 IMPLANT
KIT BASIN OR (CUSTOM PROCEDURE TRAY) ×3 IMPLANT
KIT TURNOVER KIT B (KITS) ×3 IMPLANT
NDL HYPO 25X1 1.5 SAFETY (NEEDLE) ×1 IMPLANT
NDL SPNL 18GX3.5 QUINCKE PK (NEEDLE) ×1 IMPLANT
NEEDLE HYPO 18GX1.5 BLUNT FILL (NEEDLE) IMPLANT
NEEDLE HYPO 25X1 1.5 SAFETY (NEEDLE) ×3 IMPLANT
NEEDLE SPNL 18GX3.5 QUINCKE PK (NEEDLE) ×3 IMPLANT
NS IRRIG 1000ML POUR BTL (IV SOLUTION) ×3 IMPLANT
OIL CARTRIDGE MAESTRO DRILL (MISCELLANEOUS) ×3
PACK LAMINECTOMY NEURO (CUSTOM PROCEDURE TRAY) ×3 IMPLANT
PAD ARMBOARD 7.5X6 YLW CONV (MISCELLANEOUS) ×9 IMPLANT
SPONGE SURGIFOAM ABS GEL SZ50 (HEMOSTASIS) IMPLANT
STRIP CLOSURE SKIN 1/2X4 (GAUZE/BANDAGES/DRESSINGS) ×1 IMPLANT
SUT VIC AB 0 CT1 18XCR BRD8 (SUTURE) ×1 IMPLANT
SUT VIC AB 0 CT1 8-18 (SUTURE) ×3
SUT VIC AB 2-0 CT1 18 (SUTURE) ×3 IMPLANT
SUT VIC AB 3-0 SH 8-18 (SUTURE) ×3 IMPLANT
SYR 5ML LL (SYRINGE) IMPLANT
TOWEL GREEN STERILE (TOWEL DISPOSABLE) ×3 IMPLANT
TOWEL GREEN STERILE FF (TOWEL DISPOSABLE) ×3 IMPLANT
WATER STERILE IRR 1000ML POUR (IV SOLUTION) ×3 IMPLANT

## 2019-09-11 NOTE — Transfer of Care (Signed)
Immediate Anesthesia Transfer of Care Note  Patient: Christina Lewis  Procedure(s) Performed: Right Lumbar Three-Four Microdiscectomy (Right Spine Lumbar)  Patient Location: PACU  Anesthesia Type:General  Level of Consciousness: awake, alert  and oriented  Airway & Oxygen Therapy: Patient Spontanous Breathing and Patient connected to face mask oxygen  Post-op Assessment: Report given to RN and Post -op Vital signs reviewed and stable  Post vital signs: Reviewed and stable  Last Vitals:  Vitals Value Taken Time  BP 124/77 09/11/19 0910  Temp    Pulse 85 09/11/19 0911  Resp 17 09/11/19 0911  SpO2 99 % 09/11/19 0911  Vitals shown include unvalidated device data.  Last Pain:  Vitals:   09/11/19 0637  TempSrc: Oral  PainSc:       Patients Stated Pain Goal: 5 (09/11/19 0618)  Complications: No complications documented.

## 2019-09-11 NOTE — Discharge Summary (Signed)
Physician Discharge Summary  Patient ID: Christina Lewis MRN: [id number] DOB/AGE: 05/11/1955 64 y.o.  Admit date: 09/11/2019 Discharge date: 09/11/2019  Admission Diagnoses: Herniated nucleus pulposus, Lumbar L 34, scoliosis, stenosis, lumbago, radiculopathy  Discharge Diagnoses: Herniated nucleus pulposus, Lumbar L 34, scoliosis, stenosis, lumbago, radiculopathy  Active Problems:   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 myelopathy   Discharged Condition: good  Hospital Course: Patient had a L 34 disc rupture on the right with significant right leg pain and weakness. She also has scoliotic degeneration at this level with lateral recess stenosis. It was elected to take her to surgery for right L 34 microdiscectomy. After a successful surgery, the patient was extubated in the OR and then taken to the PACU in stable condition for recovery. She was then transferred to [facility] for observation. She worked with therapies who have signed off and is ready to be discharged home.   Consults: None  Significant Diagnostic Studies: radiology: X-ray  Treatments: surgery: Right Lumbar Three-Four Microdiscectomy (Right) - Right Lumbar Three-Four Microdiscectomy  Discharge Exam: Blood pressure 119/79, pulse 99, temperature (!) 97 F (36.1 C), resp. rate 20, height 5\' 4"  (1.626 m), weight 74.8 kg, SpO2 99 %. Neurologic: Alert and oriented X 3, normal strength and tone. Normal symmetric reflexes. Normal coordination and gait Mental status: Alert, oriented, thought content appropriate Sensory: normal Motor: Improved:   Patient is much improved and has no current complaints. She mobilized well with therapy. She is conversant, A/OX3. MAEW with good strength that is equal bilaterally. Dressing is CDI.  Disposition: Discharge to home   Discharge Instructions     Remove dressing in 72 hours   Complete by: As directed    Diet - low sodium heart healthy   Complete by: As directed    Incentive  spirometry RT   Complete by: As directed    Increase activity slowly   Complete by: As directed      Allergies as of 09/11/2019      Reactions   Sulfa Antibiotics Anaphylaxis   Codeine Itching   Erythromycin Nausea And Vomiting   Metronidazole    Diarrhea    Morphine And Related Itching      Medication List    STOP taking these medications   aspirin EC 81 MG tablet     TAKE these medications   acetaminophen 500 MG tablet Commonly known as: TYLENOL Take 500-1,000 mg by mouth every 6 (six) hours as needed (for pain.).   azelastine 0.05 % ophthalmic solution Commonly known as: OPTIVAR Place 1 drop into both eyes 2 (two) times daily as needed (allergy eyes.).   baclofen 10 MG tablet Commonly known as: LIORESAL TAKE 1 TABLET EVERY MORNING AND 1 TABLET AT NOON. What changed: See the new instructions.   BIOTIN PO Take 1 tablet by mouth daily.   butalbital-acetaminophen-caffeine 50-325-40 MG tablet Commonly known as: FIORICET Take 1-2 tablets by mouth 2 (two) times daily as needed for migraine.   cholecalciferol 25 MCG (1000 UNIT) tablet Commonly known as: VITAMIN D Take 1,000 Units by mouth daily.   diclofenac Sodium 1 % Gel Commonly known as: VOLTAREN APPLY 2-4 GRAMS TO AFFECTED JOINTS UP TO 4 TIMES A DAY AS NEEDED. What changed:   how much to take  how to take this  when to take this  reasons to take this  additional instructions   DULoxetine 60 MG capsule Commonly known as: CYMBALTA Take 120 mg by mouth daily.   eletriptan 40 MG tablet Commonly known  as: RELPAX Take 40 mg by mouth 2 (two) times daily as needed for migraine.   ferrous sulfate 325 (65 FE) MG tablet Take 325 mg by mouth daily with breakfast.   fluticasone 50 MCG/ACT nasal spray Commonly known as: FLONASE Place 1 spray into both nostrils daily.   HYDROcodone-acetaminophen 5-325 MG tablet Commonly known as: NORCO/VICODIN Take 1-2 tablets by mouth every 4 (four) hours as needed for  moderate pain.   hydroxychloroquine 200 MG tablet Commonly known as: PLAQUENIL TAKE 1 TABLET TWICE DAILY-MONDAY THRU FRIDAY FOR RHEUMATOID ARTHRITIS. What changed: See the new instructions.   ibuprofen 200 MG tablet Commonly known as: ADVIL Take 400 mg by mouth every 8 (eight) hours as needed (pain.).   levocetirizine 5 MG tablet Commonly known as: XYZAL Take 5 mg by mouth at bedtime.   MAGNESIUM PO Take 1 tablet by mouth daily.   montelukast 10 MG tablet Commonly known as: SINGULAIR Take 10 mg by mouth at bedtime.   multivitamin with minerals Tabs tablet Take 1 tablet by mouth daily.   omeprazole 20 MG capsule Commonly known as: PRILOSEC Take 20 mg by mouth daily before breakfast.   ondansetron 8 MG disintegrating tablet Commonly known as: ZOFRAN-ODT Take 8 mg by mouth every 8 (eight) hours as needed for nausea or vomiting.   tiZANidine 4 MG tablet Commonly known as: ZANAFLEX TAKE 1 TABLET BY MOUTH AT BEDTIME.   topiramate 50 MG tablet Commonly known as: TOPAMAX Take 50 mg by mouth 2 (two) times daily.   TURMERIC-GINGER PO Take 1 tablet by mouth daily.        Signed: Dorian Heckle, MD 09/11/2019, 1:21 PM

## 2019-09-11 NOTE — Plan of Care (Signed)
Pt and husband given D/C instructions with verbal understanding. Rx's were sent to the pharmacy by MD. Pt's incision is clean and dry with no sign of infection. Pt's IV was removed prior to D/C. Pt D/C'd home via wheelchair per MD order. Pt is stable @ D/C and has no other needs at this time. Teriann Livingood, RN 

## 2019-09-11 NOTE — Op Note (Addendum)
09/11/2019  9:10 AM  PATIENT:  Christina Lewis  64 y.o. female  PRE-OPERATIVE DIAGNOSIS:  Herniated nucleus pulposus, Lumbar L 34, scoliosis, stenosis, lumbago, radiculopathy  POST-OPERATIVE DIAGNOSIS: Herniated nucleus pulposus, Lumbar L 34, scoliosis, stenosis, lumbago, radiculopathy  PROCEDURE:  Procedure(s) with comments: Right Lumbar Three-Four Microdiscectomy (Right) - Right Lumbar Three-Four Microdiscectomy  SURGEON:  Surgeon(s) and Role:    Maeola Harman, MD - Primary  PHYSICIAN ASSISTANT: Julien Girt, NP  ASSISTANTS: Poteat, RN   ANESTHESIA:   general  EBL:  50 mL   BLOOD ADMINISTERED:none  DRAINS: none   LOCAL MEDICATIONS USED:  MARCAINE    and LIDOCAINE   SPECIMEN:  No Specimen  DISPOSITION OF SPECIMEN:  N/A  COUNTS:  YES  TOURNIQUET:  * No tourniquets in log *  DICTATION: Patient has a  L 34 disc rupture on the right with significant right leg pain and weakness. She also has scoliotic degeneration at this level with lateral recess stenosis.  It was elected to take her to surgery for right L 34 microdiscectomy.  Procedure: Patient was brought to the operating room and following the smooth and uncomplicated induction of general endotracheal anesthesia she was placed in a prone position on the Mcclatchy frame. Low back was prepped and draped in the usual sterile fashion with betadine scrub and DuraPrep. Preoperative localizing X ray was obtained with a spinal needle.  Area of planned incision was infiltrated with local lidocaine. Incision was made in the midline and carried to the lumbodorsal fascia which was incised on the right side of midline. Subperiosteal dissection was performed exposing what was felt to be L 34 level. Intraoperative x-ray demonstrated marker probe at L3-4.  A hemi-semi-laminectomy of L 3 was performed a high-speed drill and completed with Kerrison rongeurs and a generous foraminotomy was performed overlying the superior aspect of the L 4 lamina.  Ligamentum flavum was detached and removed in a piecemeal fashion and the L 4 nerve root was decompressed laterally with removal of the superior aspect of the facet and ligamentum causing nerve root compression. The microscope was brought into the field and the L 4 nerve root was mobilized medially. This exposed a ridge of soft disc material and a superiorly migrated free fragment of herniated disc material. Multiple fragments were removed and these extended into the interspace which appeared to be quite soft with a disrupted annulus overlying the interspace. As a result it was elected to further decompress the interspace and remove loose disc material and this was done with a variety of pituitary rongeurs. The redundant annulus was also removed with 2 mm Kerrison rongeur. At this point it was felt that all neural elements were well decompressed and there was no evidence of residual loose disc material within the interspace. The interspace was then irrigated with saline and no additional disc material was mobilized. Hemostasis was assured with bipolar electrocautery and the interspace was irrigated with Depo-Medrol and fentanyl. The lumbodorsal fascia was closed with 0 Vicryl sutures the subcutaneous tissues reapproximated 2-0 Vicryl inverted sutures and the skin edges were reapproximated with 3-0 Vicryl subcuticular stitch. The wound is dressed with Dermabond and an occlusive dressing. Patient was extubated in the operating room and taken to recovery in stable and satisfactory condition having tolerated her operation well counts were correct at the end of the case.   PLAN OF CARE: Admit for overnight observation  PATIENT DISPOSITION:  PACU - hemodynamically stable.   Delay start of Pharmacological VTE agent (>24hrs) due  to surgical blood loss or risk of bleeding: yes

## 2019-09-11 NOTE — Interval H&P Note (Signed)
History and Physical Interval Note:  09/11/2019 7:30 AM  Christina Lewis  has presented today for surgery, with the diagnosis of Herniated nucleus pulposus, Lumbar.  The various methods of treatment have been discussed with the patient and family. After consideration of risks, benefits and other options for treatment, the patient has consented to  Procedure(s) with comments: Right Lumbar Three-Four Microdiscectomy (Right) - Right Lumbar Three-Four Microdiscectomy as a surgical intervention.  The patient's history has been reviewed, patient examined, no change in status, stable for surgery.  I have reviewed the patient's chart and labs.  Questions were answered to the patient's satisfaction.     Dorian Heckle

## 2019-09-11 NOTE — Anesthesia Postprocedure Evaluation (Signed)
Anesthesia Post Note  Patient: ANGELIK WALLS  Procedure(s) Performed: Right Lumbar Three-Four Microdiscectomy (Right Spine Lumbar)     Patient location during evaluation: PACU Anesthesia Type: General Level of consciousness: awake and alert Pain management: pain level controlled Vital Signs Assessment: post-procedure vital signs reviewed and stable Respiratory status: spontaneous breathing, nonlabored ventilation and respiratory function stable Cardiovascular status: blood pressure returned to baseline and stable Postop Assessment: no apparent nausea or vomiting Anesthetic complications: no   No complications documented.  Last Vitals:  Vitals:   09/11/19 1041 09/11/19 1045  BP: 119/79   Pulse: 98 99  Resp: (!) 27 20  Temp:  (!) 36.1 C  SpO2: 98% 99%    Last Pain:  Vitals:   09/11/19 1110  TempSrc:   PainSc: 2       LLE Sensation: Full sensation (09/11/19 1110)   RLE Sensation: Full sensation (09/11/19 1110)      Allysha Tryon,W. EDMOND

## 2019-09-11 NOTE — Progress Notes (Signed)
The patient reports that she is doing well and has a resolution of her preoperative symptoms. She reports mild RLE pain. She has good strength that is symmetric bilaterally. Dressing is CDI with no erythema, swelling, or drainage. Discharge home.

## 2019-09-11 NOTE — Brief Op Note (Addendum)
09/11/2019  9:10 AM  PATIENT:  Christina Lewis  64 y.o. female  PRE-OPERATIVE DIAGNOSIS:  Herniated nucleus pulposus, Lumbar L 34, scoliosis, stenosis, lumbago, radiculopathy  POST-OPERATIVE DIAGNOSIS: Herniated nucleus pulposus, Lumbar L 34, scoliosis, stenosis, lumbago, radiculopathy  PROCEDURE:  Procedure(s) with comments: Right Lumbar Three-Four Microdiscectomy (Right) - Right Lumbar Three-Four Microdiscectomy  SURGEON:  Surgeon(s) and Role:    * Parth Mccormac, MD - Primary  PHYSICIAN ASSISTANT: McDaniel, NP  ASSISTANTS: Poteat, RN   ANESTHESIA:   general  EBL:  50 mL   BLOOD ADMINISTERED:none  DRAINS: none   LOCAL MEDICATIONS USED:  MARCAINE    and LIDOCAINE   SPECIMEN:  No Specimen  DISPOSITION OF SPECIMEN:  N/A  COUNTS:  YES  TOURNIQUET:  * No tourniquets in log *  DICTATION: Patient has a  L 34 disc rupture on the right with significant right leg pain and weakness. She also has scoliotic degeneration at this level with lateral recess stenosis.  It was elected to take her to surgery for right L 34 microdiscectomy.  Procedure: Patient was brought to the operating room and following the smooth and uncomplicated induction of general endotracheal anesthesia she was placed in a prone position on the [place]ll[place] frame. Low back was prepped and draped in the usual sterile fashion with betadine scrub and DuraPrep. Preoperative localizing X ray was obtained with a spinal needle.  Area of planned incision was infiltrated with local lidocaine. Incision was made in the midline and carried to the lumbodorsal fascia which was incised on the right side of midline. Subperiosteal dissection was performed exposing what was felt to be L 34 level. Intraoperative x-ray demonstrated marker probe at L3-4.  A hemi-semi-laminectomy of L 3 was performed a high-speed drill and completed with Kerrison rongeurs and a generous foraminotomy was performed overlying the superior aspect of the L 4 lamina.  Ligamentum flavum was detached and removed in a piecemeal fashion and the L 4 nerve root was decompressed laterally with removal of the superior aspect of the facet and ligamentum causing nerve root compression. The microscope was brought into the field and the L 4 nerve root was mobilized medially. This exposed a ridge of soft disc material and a superiorly migrated free fragment of herniated disc material. Multiple fragments were removed and these extended into the interspace which appeared to be quite soft with a disrupted annulus overlying the interspace. As a result it was elected to further decompress the interspace and remove loose disc material and this was done with a variety of pituitary rongeurs. The redundant annulus was also removed with 2 mm Kerrison rongeur. At this point it was felt that all neural elements were well decompressed and there was no evidence of residual loose disc material within the interspace. The interspace was then irrigated with saline and no additional disc material was mobilized. Hemostasis was assured with bipolar electrocautery and the interspace was irrigated with Depo-Medrol and fentanyl. The lumbodorsal fascia was closed with 0 Vicryl sutures the subcutaneous tissues reapproximated 2-0 Vicryl inverted sutures and the skin edges were reapproximated with 3-0 Vicryl subcuticular stitch. The wound is dressed with Dermabond and an occlusive dressing. Patient was extubated in the operating room and taken to recovery in stable and satisfactory condition having tolerated her operation well counts were correct at the end of the case.   PLAN OF CARE: Admit for overnight observation  PATIENT DISPOSITION:  PACU - hemodynamically stable.   Delay start of Pharmacological VTE agent (>24hrs) due   to surgical blood loss or risk of bleeding: yes

## 2019-09-11 NOTE — Telephone Encounter (Signed)
Last Visit: 07/31/2019 Next Visit: 01/01/2020   Okay to refill Tizanidine and Baclofen?

## 2019-09-11 NOTE — Anesthesia Procedure Notes (Addendum)
Procedure Name: Intubation Date/Time: 09/11/2019 7:47 AM Performed by: Bryson Corona, CRNA Pre-anesthesia Checklist: Patient identified, Emergency Drugs available, Suction available and Patient being monitored Patient Re-evaluated:Patient Re-evaluated prior to induction Oxygen Delivery Method: Circle System Utilized Preoxygenation: Pre-oxygenation with 100% oxygen Induction Type: IV induction Ventilation: Two handed mask ventilation required and Oral airway inserted - appropriate to patient size Laryngoscope Size: Mac and 3 Grade View: Grade III Tube type: Oral Tube size: 7.0 mm Number of attempts: 1 Airway Equipment and Method: Stylet and Oral airway Placement Confirmation: ETT inserted through vocal cords under direct vision,  positive ETCO2 and breath sounds checked- equal and bilateral Secured at: 21 cm Tube secured with: Tape Dental Injury: Teeth and Oropharynx as per pre-operative assessment  Difficulty Due To: Difficult Airway- due to anterior larynx Comments: Pt has short chin and anterior airway. Able to see bottom of arytenoids and pass ETT

## 2019-09-11 NOTE — Evaluation (Signed)
Physical Therapy Evaluation & Discharge  Patient Details Name: Christina Lewis MRN: [id number] DOB: 1956-01-11 Today's Date: 09/11/2019   History of Present Illness   Pt is a 64 yo female with s/p L3/4 microdiscetomy on 09/11/2019 due to herniated nucleus pulposus, scoliosis, stenosis, lumbago, and radiculopathy. Pt has a PMH of fibromyalgia, sleep apnea, OA of hands, and migraines.Pt is a 64 yo female with s/p L3/4 microdiscetomy on 09/11/2019. Pt had a Herniated nucleus pulposus of Lumbar L 3/4, scoliosis, stenosis, lumbago, and radiculopathy. Pt has a PMH of fibromyalgia, sleep apnea, OA of hands, and migraines.    Clinical Impression  Pt is a pleasant 64 yo female who was evaluated for the above operation and the impairments listed below. Pt was accompanied by her husband who lives with her full time and can provide assistance as needed. Pt was unaware of spinal precautions and was educated on proper mobility techniques. Pt required supervision to min assist for all mobility tasks. Pt was expecting to be d/c to home today and would not require any further acute therapy services at this time. Will sign off. If needs change, please reconsult.     Follow Up Recommendations No PT follow up    Equipment Recommendations  None recommended by PT    Recommendations for Other Services       Precautions / Restrictions Precautions Precautions: Back Precaution Booklet Issued: Yes (comment) Precaution Comments: pt educated on back precautions and how to maintain during ADLs  Restrictions Weight Bearing Restrictions: No      Mobility  Bed Mobility Overal bed mobility: Needs Assistance Bed Mobility: Rolling;Sidelying to Sit;Sit to Sidelying Rolling: Supervision Sidelying to sit: Min assist (pt wanted to use contralateral UE to rotate at trunk)     Sit to sidelying: Supervision General bed mobility comments: pt was educated on proper bed mobility with spinal precautions and required verbal  cueing and manual assist with rolling and sidelying to sit. pt required increase time and use of bed railling for roll. supervision for safety for return to supine   Transfers Overall transfer level: Needs assistance Equipment used: None Transfers: Sit to/from Stand Sit to Stand: Supervision         General transfer comment: supervision assist needed only for safety   Ambulation/Gait Ambulation/Gait assistance: Min guard Gait Distance (Feet): 120 Feet Assistive device: None Gait Pattern/deviations: Decreased step length - right;Decreased stance time - left;Step-through pattern Gait velocity: decreased   General Gait Details: pt demonstrated guarding and decreased step length bilaterally  Stairs Stairs: Yes Stairs assistance: Min guard;Min assist Stair Management: No rails;Backwards;Step to pattern;Forwards Number of Stairs: 1 General stair comments: pt required HHA for safety, pt needed min assist for LOB when transitioning down last step backwards. pt ascended 1 stair forwards and descended 1 stair backwards. pt was cued for stair climbing sequencing   Wheelchair Mobility    Modified Rankin (Stroke Patients Only)       Balance Overall balance assessment: Mild deficits observed, not formally tested                                           Pertinent Vitals/Pain Pain Assessment: 0-10 Pain Score: 4  Pain Location: R LE Pain Descriptors / Indicators: Heaviness Pain Intervention(s): Limited activity within patient's tolerance;Monitored during session;Repositioned    Home Living Family/patient expects to be discharged to:: Private residence Living Arrangements: Spouse/significant  other Available Help at Discharge: Available 24 hours/day;Family Type of Home: House Home Access: Stairs to enter Entrance Stairs-Rails: None Entrance Stairs-Number of Steps: 2 Home Layout: Two level Home Equipment: Bedside commode;Cane - single point      Prior  Function Level of Independence: Independent               Hand Dominance        Extremity/Trunk Assessment   Upper Extremity Assessment Upper Extremity Assessment: Defer to OT evaluation    Lower Extremity Assessment Lower Extremity Assessment: RLE deficits/detail RLE Deficits / Details: pt stated that she had increased pain and heaviness with initial steps, pain minimized as we continued session. pt     Cervical / Trunk Assessment Cervical / Trunk Assessment: Other exceptions (s/p lumbar surgery)  Communication   Communication: No difficulties  Cognition Arousal/Alertness: Awake/alert Behavior During Therapy: WFL for tasks assessed/performed Overall Cognitive Status: Within Functional Limits for tasks assessed                                        General Comments      Exercises     Assessment/Plan    PT Assessment Patent does not need any further PT services  PT Problem List         PT Treatment Interventions      PT Goals (Current goals can be found in the Care Plan section)  Acute Rehab PT Goals Patient Stated Goal: pt to be pain free and return home PT Goal Formulation: With patient/family Time For Goal Achievement: 09/11/19 Potential to Achieve Goals: Good    Frequency     Barriers to discharge        Co-evaluation               AM-PAC PT "6 Clicks" Mobility  Outcome Measure Help needed turning from your back to your side while in a flat bed without using bedrails?: None Help needed moving from lying on your back to sitting on the side of a flat bed without using bedrails?: A Little Help needed moving to and from a bed to a chair (including a wheelchair)?: None Help needed standing up from a chair using your arms (e.g., wheelchair or bedside chair)?: None Help needed to walk in hospital room?: A Little Help needed climbing 3-5 steps with a railing? : A Little 6 Click Score: 21    End of Session Equipment Utilized  During Treatment: Gait belt Activity Tolerance: Patient limited by pain Patient left: in bed;with call Kamrynn Melott/phone within reach Nurse Communication: Mobility status PT Visit Diagnosis: Unsteadiness on feet (R26.81);Pain;Muscle weakness (generalized) (M62.81);Other abnormalities of gait and mobility (R26.89) Pain - Right/Left: Right Pain - part of body: Leg    Time: 0814-4818 PT Time Calculation (min) (ACUTE ONLY): 30 min   Charges:   PT Evaluation $PT Eval Low Complexity: 1 Low PT Treatments $Gait Training: 8-22 mins       Harmon Pier, SPT  Acute Rehabilitation Services  Office: [phone]  09/11/2019, 2:01 PM

## 2019-09-12 ENCOUNTER — Encounter (HOSPITAL_COMMUNITY): Payer: Self-pay | Admitting: Neurosurgery

## 2019-09-19 ENCOUNTER — Other Ambulatory Visit: Payer: Self-pay | Admitting: Rheumatology

## 2019-09-20 NOTE — Telephone Encounter (Signed)
Last Visit: 07/31/2019 Next Visit: 01/01/2020  Okay to refill per Dr. Deveshwar  

## 2019-09-27 ENCOUNTER — Ambulatory Visit: Payer: BC Managed Care – PPO | Admitting: Rheumatology

## 2019-10-15 ENCOUNTER — Other Ambulatory Visit: Payer: Self-pay | Admitting: Rheumatology

## 2019-10-15 NOTE — Telephone Encounter (Signed)
Last Visit:07/31/2019 Next Visit:01/01/2020  Last Fill: 09/11/2019  Okay to refill Tizanidine?

## 2019-10-19 ENCOUNTER — Ambulatory Visit: Payer: BC Managed Care – PPO | Admitting: Rheumatology

## 2019-10-30 IMAGING — MG DIGITAL DIAGNOSTIC UNILATERAL LEFT MAMMOGRAM WITH TOMO AND CAD
6 series · 6 of 18 positions shown · non-contrast
Comparison: Previous exam(s).

CLINICAL DATA: Screening recall for a possible left breast mass.
The patient does take a daily aspirin and has possibly had a recent
bump to the left breast.

EXAM:
DIGITAL DIAGNOSTIC LEFT MAMMOGRAM WITH CAD AND TOMO
ULTRASOUND LEFT BREAST

[L CC synth-2D]
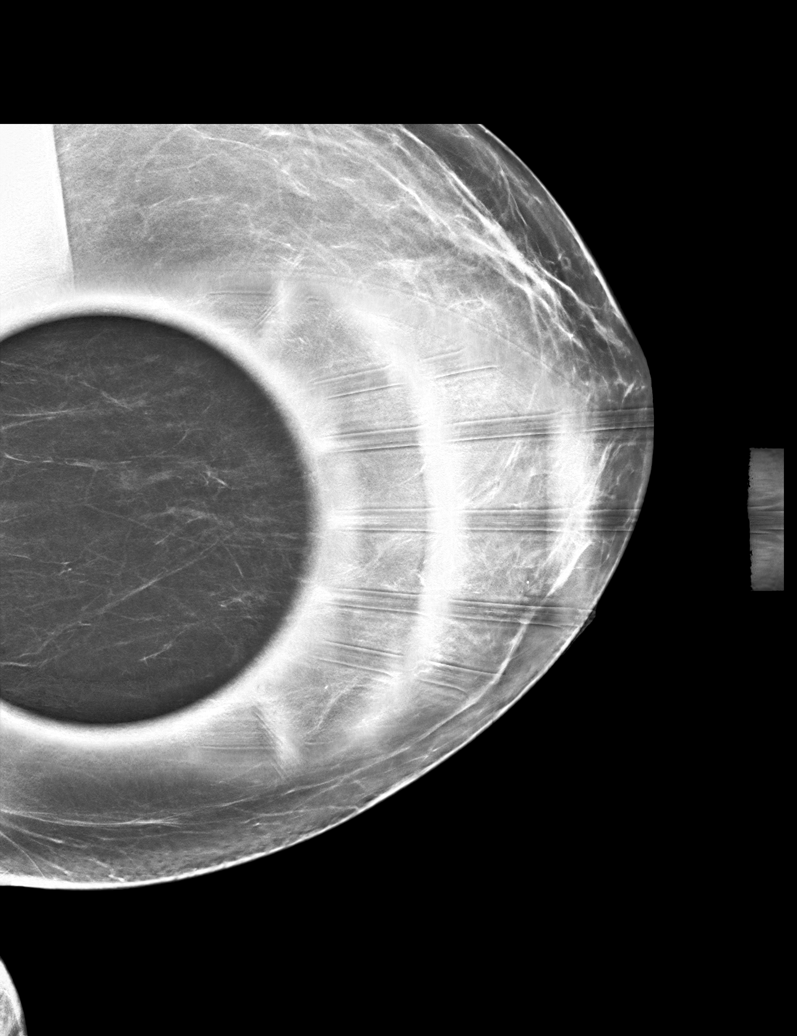

[L MLO synth-2D (1 of 2)]
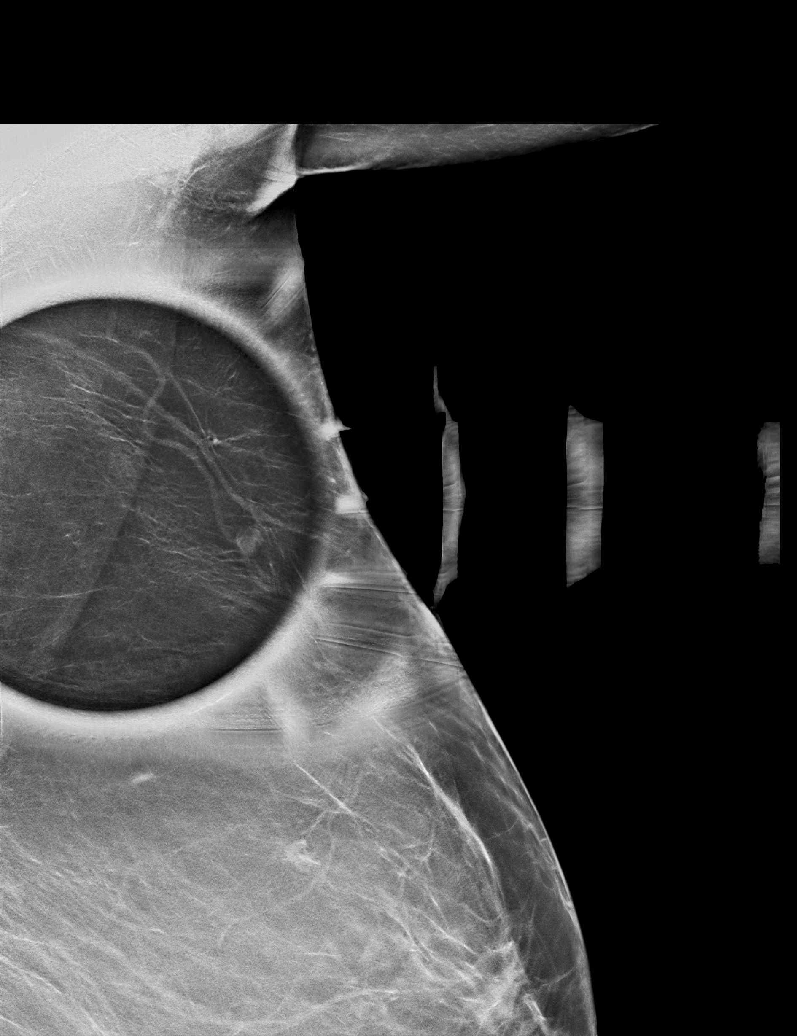

[L MLO synth-2D (2 of 2)]
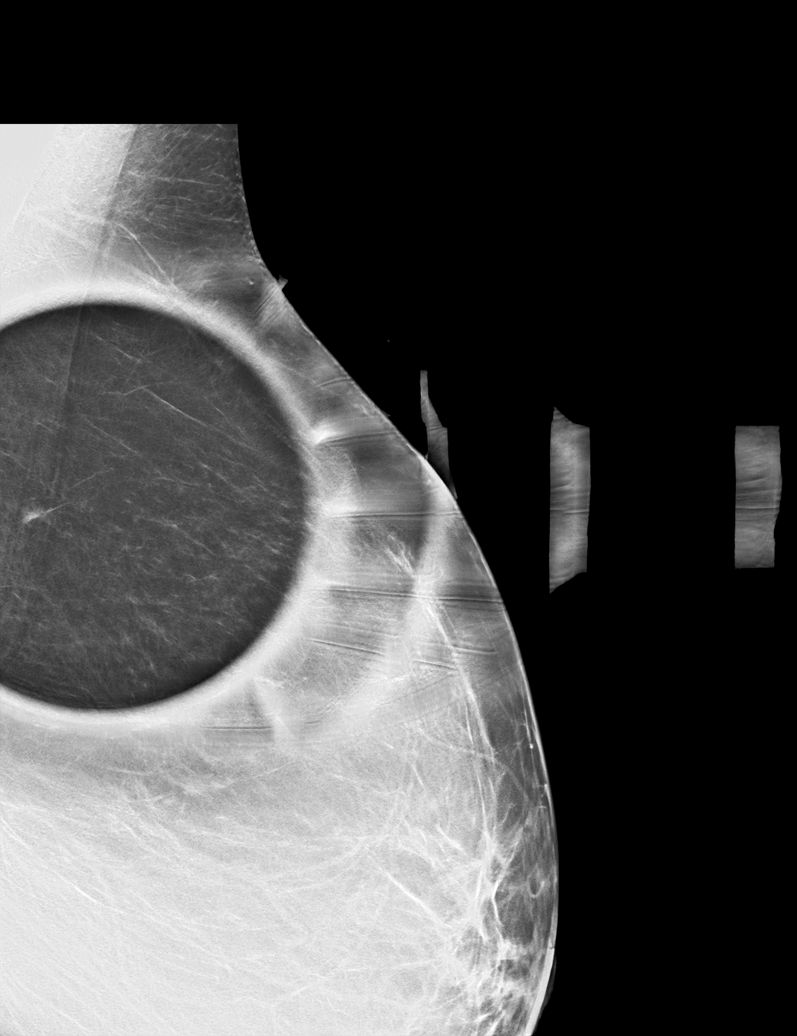

[L MLO tomo (1 of 2) · tomo slice 29/56.0]
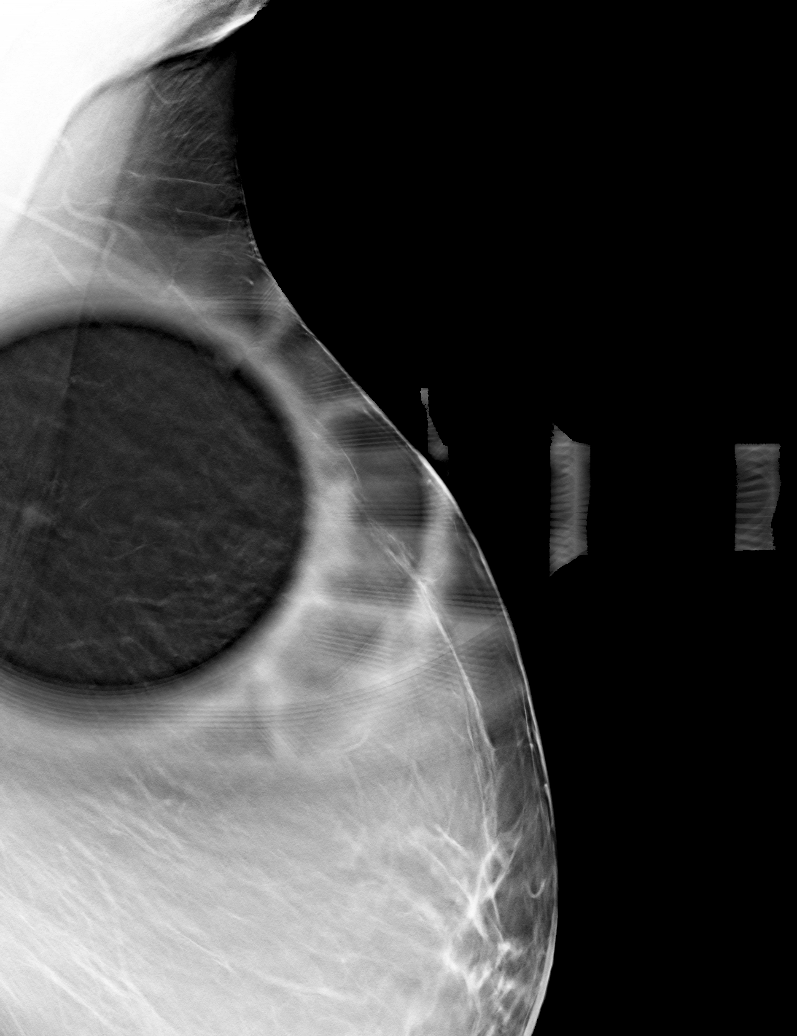

[L CC tomo · tomo slice 32/63.0]
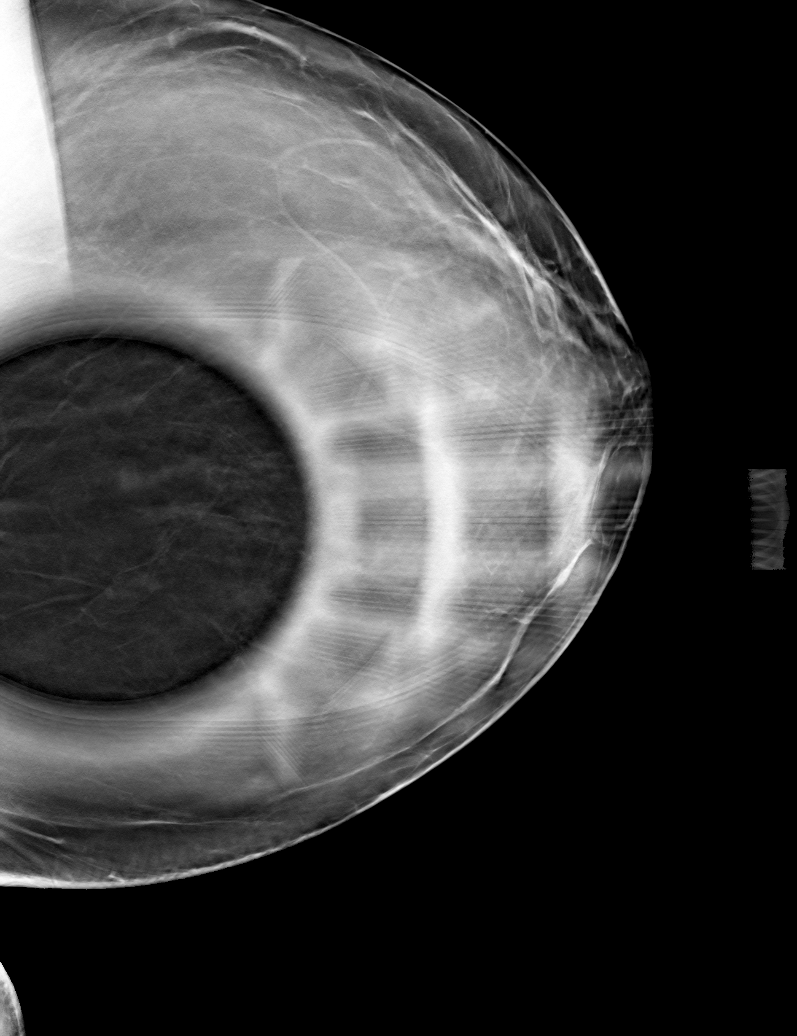

[L MLO tomo (2 of 2) · tomo slice 35/68.0]
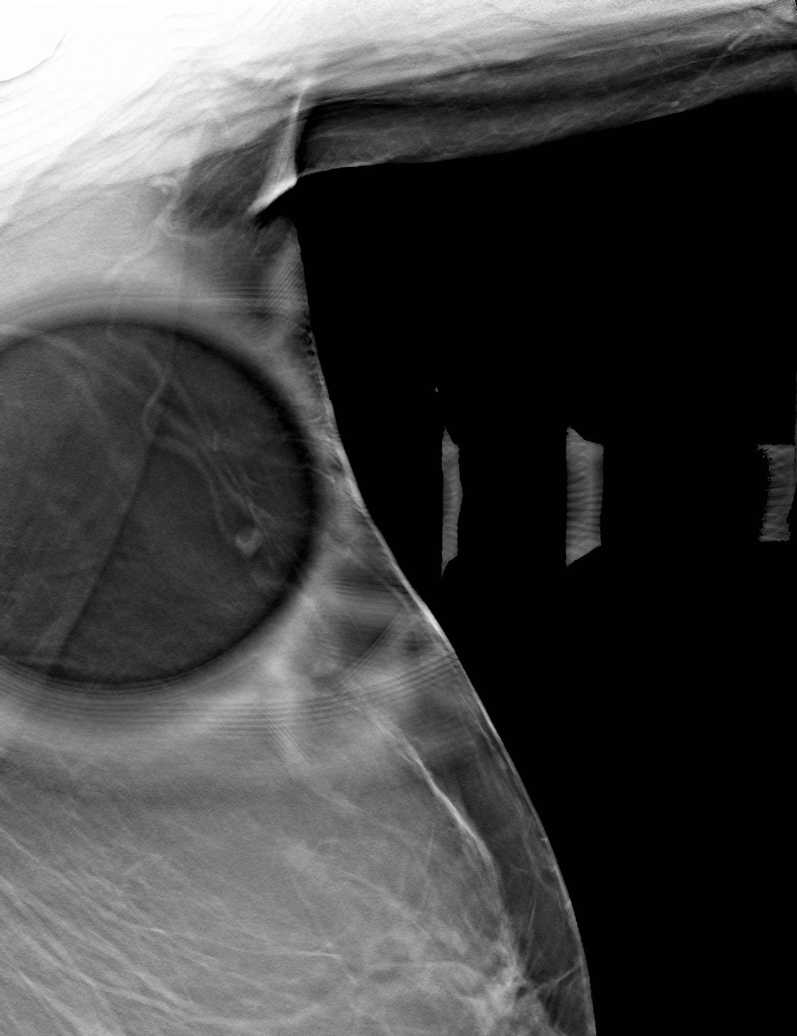

[6 of 18 positions shown; findings below may reference images not displayed]

ACR Breast Density Category b: There are scattered areas of
fibroglandular density.
FINDINGS: Spot compression tomosynthesis images of the superior posterior left
breast demonstrates a persistent asymmetry about 5 mm. On the spot
compression tomosynthesis image in the MLO projection, as well as
the full paddle CC projection acquired during the screening
mammogram, there does appear to be a circumscribed round focus of
macroscopic fat with in the asymmetry, which could possibly
represent fat necrosis.

Mammographic images were processed with CAD.

Ultrasound of left breast at 12 o'clock, 8 cm from the nipple
demonstrates an echogenic mass with a central hypoechoic mass likely
corresponding with the macroscopic fat identified on the mammogram.
This area measures 0.8 x 0.5 x 0.7 cm.
IMPRESSION: 1. There is a mass in the left breast at 12 o'clock, likely
representing fat necrosis. This is probably benign.

RECOMMENDATION:
Three-month follow-up left breast mammogram and possible ultrasound
is recommended.

I have discussed the findings and recommendations with the patient.
Results were also provided in writing at the conclusion of the
visit. If applicable, a reminder letter will be sent to the patient
regarding the next appointment.

BI-RADS CATEGORY  3: Probably benign.

## 2019-10-31 ENCOUNTER — Other Ambulatory Visit: Payer: Self-pay

## 2019-10-31 DIAGNOSIS — Z79899 Other long term (current) drug therapy: Secondary | ICD-10-CM

## 2019-11-01 ENCOUNTER — Encounter: Payer: Self-pay | Admitting: Neurology

## 2019-11-01 LAB — CBC WITH DIFFERENTIAL/PLATELET
Absolute Monocytes: 778 cells/uL (ref 200–950)
Basophils Absolute: 134 cells/uL (ref 0–200)
Basophils Relative: 1.4 %
Eosinophils Absolute: 451 cells/uL (ref 15–500)
Eosinophils Relative: 4.7 %
HCT: 40.8 % (ref 35.0–45.0)
Hemoglobin: 13.8 g/dL (ref 11.7–15.5)
Lymphs Abs: 3696 cells/uL (ref 850–3900)
MCH: 31.4 pg (ref 27.0–33.0)
MCHC: 33.8 g/dL (ref 32.0–36.0)
MCV: 92.7 fL (ref 80.0–100.0)
MPV: 11 fL (ref 7.5–12.5)
Monocytes Relative: 8.1 %
Neutro Abs: 4541 cells/uL (ref 1500–7800)
Neutrophils Relative %: 47.3 %
Platelets: 310 10*3/uL (ref 140–400)
RBC: 4.4 10*6/uL (ref 3.80–5.10)
RDW: 13.3 % (ref 11.0–15.0)
Total Lymphocyte: 38.5 %
WBC: 9.6 10*3/uL (ref 3.8–10.8)

## 2019-11-01 LAB — COMPLETE METABOLIC PANEL WITH GFR
AG Ratio: 2 (calc) (ref 1.0–2.5)
ALT: 31 U/L — ABNORMAL HIGH (ref 6–29)
AST: 21 U/L (ref 10–35)
Albumin: 4.4 g/dL (ref 3.6–5.1)
Alkaline phosphatase (APISO): 62 U/L (ref 37–153)
BUN: 24 mg/dL (ref 7–25)
CO2: 26 mmol/L (ref 20–32)
Calcium: 9.9 mg/dL (ref 8.6–10.4)
Chloride: 108 mmol/L (ref 98–110)
Creat: 0.82 mg/dL (ref 0.50–0.99)
GFR, Est African American: 89 mL/min/{1.73_m2} (ref >=60)
GFR, Est Non African American: 77 mL/min/{1.73_m2} (ref >=60)
Globulin: 2.2 g/dL (calc) (ref 1.9–3.7)
Glucose, Bld: 81 mg/dL (ref 65–99)
Potassium: 4 mmol/L (ref 3.5–5.3)
Sodium: 141 mmol/L (ref 135–146)
Total Bilirubin: 0.2 mg/dL (ref 0.2–1.2)
Total Protein: 6.6 g/dL (ref 6.1–8.1)

## 2019-11-01 NOTE — Progress Notes (Signed)
ALT is borderline elevated-31.  AST WNL. Please advise the patient to avoid taking tylenol, NSAIDs, and alcohol.  Rest of CMP WNL.  CBC WNL.

## 2019-11-08 ENCOUNTER — Encounter (HOSPITAL_COMMUNITY): Payer: Self-pay | Admitting: Physical Therapy

## 2019-11-08 NOTE — Therapy (Signed)
[place], [place], [address] Phone: [phone]   Fax:  [phone]  Patient Details  Name: Christina Lewis MRN: [id number] Date of Birth: 05-28-1955 Referring Provider:  No ref. provider found  Encounter Date: 11/08/2019  PHYSICAL THERAPY DISCHARGE SUMMARY  Visits from Start of Care: 7  Current functional level related to goals / functional outcomes: Patient has not returned to reassess.   Remaining deficits: Patient has not returned to reassess.   Education / Equipment: Patient has not returned to reassess.  Plan: Patient agrees to discharge.  Patient goals were not met. Patient is being discharged due to not returning since the last visit.  ?????      1:58 PM, 11/08/19 Mearl Latin PT, DPT Physical Therapist at [facility] [place], [place], [address] Phone: [phone]   Fax:  [phone]

## 2019-11-09 ENCOUNTER — Other Ambulatory Visit: Payer: Self-pay | Admitting: Rheumatology

## 2019-11-09 NOTE — Telephone Encounter (Signed)
Last Visit: 07/31/2019 Next Visit: 01/01/2020  Current Dose per office note on 07/31/2019: baclofen 10 mg 1 tablet twice daily as needed for muscle spasms, Zanaflex 4 mg by mouth at bedtime for muscle spasms. DX: Fibromyalgia  Okay to refill baclofen and tizanidine?

## 2019-11-15 ENCOUNTER — Other Ambulatory Visit: Payer: Self-pay | Admitting: Physician Assistant

## 2019-11-15 DIAGNOSIS — M0609 Rheumatoid arthritis without rheumatoid factor, multiple sites: Secondary | ICD-10-CM

## 2019-11-16 NOTE — Telephone Encounter (Signed)
Last Visit: 07/31/2019 Next Visit: 01/01/2020 Labs: 10/31/2019 ALT is borderline elevated-31. AST WNL.Rest of CMP WNL. CBC WNL.  Eye exam:  07/31/2019  Current Dose per office note on 07/31/2019: PLQ 200 mg 1 tablet BID M-F. DX: Rheumatoid arthritis of multiple sites with negative rheumatoid factor  Okay to refill Plaquenil?

## 2019-11-20 NOTE — H&P (Addendum)
Surgical History & Physical  Patient Name: Christina Lewis DOB: 17-Sep-1955  Surgery: Cataract extraction with intraocular lens implant phacoemulsification; Left Eye  Surgeon: [name] MD Surgery Date:  12/07/2019 Pre-Op Date:  12/03/2019  HPI: A 14 Yr. old female patient Pt referred for cataract evaluation. The patient complains of difficulty when driving due to glare from headlights or sun. Both eyes are affected. The episode is constant. The condition's severity is worsening. Distance vision is worse than near, but having some trouble reading book. The complaint is associated with glare, halos and headache. This is negatively affecting the patient's quality of life. Pt states she has had sharp, shoot pain happening sporadically. States it happened at least 4-5 times per week. Pt using AT's and Pataday prn OU. The patient experiences no increase flashes and no floaters. A1C%- 5.5 (July 2021) LBS- did not check HPI was performed by [name] .  Medical History: Cataracts Anxiety, Depression, Hypercholesterolemia, Fibrom... Diabetes - DM Type 2 High Blood Pressure Lung Problems  Review of Systems Negative Allergic/Immunologic Negative Cardiovascular Negative Constitutional Negative Ear, Nose, Mouth & Throat Negative Endocrine Negative Eyes Negative Gastrointestinal Negative Genitourinary Negative Hemotologic/Lymphatic Negative Integumentary Negative Musculoskeletal Negative Neurological Negative Psychiatry Negative Respiratory  Social   Never smoked  Medication Pataday, Artifical Tears,  Duloxetine, Floricet, Levocetirizine, Magnesium, Montelukast, Omeprazole, Ondansetron, Plaquenil, Tizandine, Topiramate, Tumeric, Vitamin D2, Baclofen, Voltaren,   Sx/Procedures  None  Drug Allergies  Sulfa,   History & Physical: Heent:  Cataract, left eye NECK: supple without bruits LUNGS: lungs clear to auscultation CV: regular rate and rhythm Abdomen: soft and  non-tender  Impression & Plan: Assessment: 1.  NUCLEAR SCLEROSIS AGE RELATED; Both Eyes (H25.13) 2.  Diabetes Type 2 No retinopathy (E11.9) 3.  BLEPHARITIS; Right Upper Lid, Right Lower Lid, Left Upper Lid, Left Lower Lid (H01.001, H01.002,H01.004,H01.005)  Plan: 1.  Cataract accounts for the patient's decreased vision. This visual impairment is not correctable with a tolerable change in glasses or contact lenses. Cataract surgery with an implantation of a new lens should significantly improve the visual and functional status of the patient. Discussed all risks, benefits, alternatives, and potential complications. Discussed the procedures and recovery. Patient desires to have surgery. A-scan ordered and performed today for intra-ocular lens calculations. The surgery will be performed in order to improve vision for driving, reading, and for eye examinations. Recommend phacoemulsification with intra-ocular lens. Recommend Dextenza for post-operative pain and inflammation. Left Eye. non dominant - first. Dilates well - shugarcaine by protocol. Consider symfony. - needs OCT macula if choosing multifocal. If standard - goal is -2.00 OU. 2.  Stressed importance of blood sugar and blood pressure control, and also yearly eye examinations. Discussed the need for ongoing proactive ocular exams and treatment, hopefully before visual symptoms develop. 3.  Begin/continue lid scrubs.

## 2019-11-30 ENCOUNTER — Encounter (HOSPITAL_COMMUNITY)
Admission: RE | Admit: 2019-11-30 | Discharge: 2019-11-30 | Disposition: A | Discharge status: Home / Self Care | Payer: Medicare Other | Source: Ambulatory Visit | Attending: Ophthalmology | Admitting: Ophthalmology

## 2019-11-30 ENCOUNTER — Encounter (HOSPITAL_COMMUNITY): Payer: Self-pay

## 2019-11-30 ENCOUNTER — Other Ambulatory Visit (HOSPITAL_COMMUNITY)
Admission: RE | Admit: 2019-11-30 | Discharge: 2019-11-30 | Disposition: A | Discharge status: Home / Self Care | Payer: Medicare Other | Source: Ambulatory Visit | Attending: Ophthalmology | Admitting: Ophthalmology

## 2019-11-30 ENCOUNTER — Other Ambulatory Visit: Payer: Self-pay

## 2019-11-30 DIAGNOSIS — Z20822 Contact with and (suspected) exposure to covid-19: Secondary | ICD-10-CM | POA: Diagnosis not present

## 2019-11-30 DIAGNOSIS — Z01812 Encounter for preprocedural laboratory examination: Secondary | ICD-10-CM | POA: Diagnosis present

## 2019-11-30 NOTE — Patient Instructions (Signed)
Christina Lewis  11/30/2019     @PREFPERIOPPHARMACY @   Your procedure is scheduled on  12/03/2019.  Report to [facility] at  1030  A.M.  Call this number if you have problems the morning of surgery:  [phone]   Remember:  Do not eat or drink after midnight.                       Take these medicines the morning of surgery with A SIP OF WATER  Cymbalta, gabapentin, singulair, hydrocodone(if needed).    Do not wear jewelry, make-up or nail polish.  Do not wear lotions, powders, or perfume. Please wear deodorant and brush your teeth.  Do not shave 48 hours prior to surgery.  Men may shave face and neck.  Do not bring valuables to the hospital.  [facility] is not responsible for any belongings or valuables.  Contacts, dentures or bridgework may not be worn into surgery.  Leave your suitcase in the car.  After surgery it may be brought to your room.  For patients admitted to the hospital, discharge time will be determined by your treatment team.  Patients discharged the day of surgery will not be allowed to drive home.   Name and phone number of your driver:   family Special instructions:  DO NOT smoke the morning of your procedure  Please read over the following fact sheets that you were given. Anesthesia Post-op Instructions and Care and Recovery After Surgery      Cataract Surgery, Care After This sheet gives you information about how to care for yourself after your procedure. Your health care provider may also give you more specific instructions. If you have problems or questions, contact your health care provider. What can I expect after the procedure? After the procedure, it is common to have:  Itching.  Discomfort.  Fluid discharge.  Sensitivity to light and to touch.  Bruising in or around the eye.  Mild blurred vision. Follow these instructions at home: Eye care   Do not touch or rub your eyes.  Protect your eyes as told by your health care  provider. You may be told to wear a protective eye shield or sunglasses.  Do not put a contact lens into the affected eye or eyes until your health care provider approves.  Keep the area around your eye clean and dry: ? Avoid swimming. ? Do not allow water to hit you directly in the face while showering. ? Keep soap and shampoo out of your eyes.  Check your eye every day for signs of infection. Watch for: ? Redness, swelling, or pain. ? Fluid, blood, or pus. ? Warmth. ? A bad smell. ? Vision that is getting worse. ? Sensitivity that is getting worse. Activity  Do not drive for 24 hours if you were given a sedative during your procedure.  Avoid strenuous activities, such as playing contact sports, for as long as told by your health care provider.  Do not drive or use heavy machinery until your health care provider approves.  Do not bend or lift heavy objects. Bending increases pressure in the eye. You can walk, climb stairs, and do light household chores.  Ask your health care provider when you can return to work. If you work in a dusty environment, you may be advised to wear protective eyewear for a period of time. General instructions  Take or apply over-the-counter and prescription medicines only as told  by your health care provider. This includes eye drops.  Keep all follow-up visits as told by your health care provider. This is important. Contact a health care provider if:  You have increased bruising around your eye.  You have pain that is not helped with medicine.  You have a fever.  You have redness, swelling, or pain in your eye.  You have fluid, blood, or pus coming from your incision.  Your vision gets worse.  Your sensitivity to light gets worse. Get help right away if:  You have sudden loss of vision.  You see flashes of light or spots (floaters).  You have severe eye pain.  You develop nausea or vomiting. Summary  After your procedure, it is  common to have itching, discomfort, bruising, fluid discharge, or sensitivity to light.  Follow instructions from your health care provider about caring for your eye after the procedure.  Do not rub your eye after the procedure. You may need to wear eye protection or sunglasses. Do not wear contact lenses. Keep the area around your eye clean and dry.  Avoid activities that require a lot of effort. These include playing sports and lifting heavy objects.  Contact a health care provider if you have increased bruising, pain that does not go away, or a fever. Get help right away if you suddenly lose your vision, see flashes of light or spots, or have severe pain in the eye. This information is not intended to replace advice given to you by your health care provider. Make sure you discuss any questions you have with your health care provider. Document Revised: 12/05/2018 Document Reviewed: 08/08/2017 Elsevier Patient Education  [place].

## 2019-12-01 LAB — SARS CORONAVIRUS 2 (TAT 6-24 HRS): SARS Coronavirus 2: NEGATIVE

## 2019-12-03 MED ORDER — EPINEPHRINE PF 1 MG/ML IJ SOLN
INTRAMUSCULAR | Status: AC
  Filled 2019-12-03: qty 2

## 2019-12-05 ENCOUNTER — Encounter (HOSPITAL_COMMUNITY): Payer: Self-pay

## 2019-12-05 ENCOUNTER — Other Ambulatory Visit (HOSPITAL_COMMUNITY)
Admission: RE | Admit: 2019-12-05 | Discharge: 2019-12-05 | Disposition: A | Discharge status: Home / Self Care | Payer: Medicare Other | Source: Ambulatory Visit | Attending: Ophthalmology | Admitting: Ophthalmology

## 2019-12-05 ENCOUNTER — Encounter (HOSPITAL_COMMUNITY)
Admission: RE | Admit: 2019-12-05 | Discharge: 2019-12-05 | Disposition: A | Discharge status: Home / Self Care | Payer: Medicare Other | Source: Ambulatory Visit | Attending: Ophthalmology | Admitting: Ophthalmology

## 2019-12-05 ENCOUNTER — Other Ambulatory Visit: Payer: Self-pay

## 2019-12-05 DIAGNOSIS — Z20822 Contact with and (suspected) exposure to covid-19: Secondary | ICD-10-CM | POA: Insufficient documentation

## 2019-12-05 DIAGNOSIS — Z01812 Encounter for preprocedural laboratory examination: Secondary | ICD-10-CM | POA: Diagnosis present

## 2019-12-05 NOTE — Patient Instructions (Signed)
Christina Lewis  12/05/2019     @PREFPERIOPPHARMACY @   Your procedure is scheduled on  12/07/2019  Report to 12/09/2019 at  [phone]  A.M.  Call this number if you have problems the morning of surgery:  [phone]   Remember:  Do not eat or drink after midnight.                     Take these medicines the morning of surgery with A SIP OF WATER  Baclofen, fioricet or hydrocodone(if needed), cymbalta, relpax(if needed), gabapentin, plaquenil.    Do not wear jewelry, make-up or nail polish.  Do not wear lotions, powders, or perfumes. Please wear deodorant and brush your teeth.  Do not shave 48 hours prior to surgery.  Men may shave face and neck.  Do not bring valuables to the hospital.  [facility] is not responsible for any belongings or valuables.  Contacts, dentures or bridgework may not be worn into surgery.  Leave your suitcase in the car.  After surgery it may be brought to your room.  For patients admitted to the hospital, discharge time will be determined by your treatment team.  Patients discharged the day of surgery will not be allowed to drive home.   Name and phone number of your driver:   family Special instructions:  DO NOT smoke the morning of your procedure.  Please read over the following fact sheets that you were given. Anesthesia Post-op Instructions and Care and Recovery After Surgery      Cataract Surgery, Care After This sheet gives you information about how to care for yourself after your procedure. Your health care provider may also give you more specific instructions. If you have problems or questions, contact your health care provider. What can I expect after the procedure? After the procedure, it is common to have:  Itching.  Discomfort.  Fluid discharge.  Sensitivity to light and to touch.  Bruising in or around the eye.  Mild blurred vision. Follow these instructions at home: Eye care   Do not touch or rub your eyes.  Protect  your eyes as told by your health care provider. You may be told to wear a protective eye shield or sunglasses.  Do not put a contact lens into the affected eye or eyes until your health care provider approves.  Keep the area around your eye clean and dry: ? Avoid swimming. ? Do not allow water to hit you directly in the face while showering. ? Keep soap and shampoo out of your eyes.  Check your eye every day for signs of infection. Watch for: ? Redness, swelling, or pain. ? Fluid, blood, or pus. ? Warmth. ? A bad smell. ? Vision that is getting worse. ? Sensitivity that is getting worse. Activity  Do not drive for 24 hours if you were given a sedative during your procedure.  Avoid strenuous activities, such as playing contact sports, for as long as told by your health care provider.  Do not drive or use heavy machinery until your health care provider approves.  Do not bend or lift heavy objects. Bending increases pressure in the eye. You can walk, climb stairs, and do light household chores.  Ask your health care provider when you can return to work. If you work in a dusty environment, you may be advised to wear protective eyewear for a period of time. General instructions  Take or apply over-the-counter and prescription medicines  only as told by your health care provider. This includes eye drops.  Keep all follow-up visits as told by your health care provider. This is important. Contact a health care provider if:  You have increased bruising around your eye.  You have pain that is not helped with medicine.  You have a fever.  You have redness, swelling, or pain in your eye.  You have fluid, blood, or pus coming from your incision.  Your vision gets worse.  Your sensitivity to light gets worse. Get help right away if:  You have sudden loss of vision.  You see flashes of light or spots (floaters).  You have severe eye pain.  You develop nausea or  vomiting. Summary  After your procedure, it is common to have itching, discomfort, bruising, fluid discharge, or sensitivity to light.  Follow instructions from your health care provider about caring for your eye after the procedure.  Do not rub your eye after the procedure. You may need to wear eye protection or sunglasses. Do not wear contact lenses. Keep the area around your eye clean and dry.  Avoid activities that require a lot of effort. These include playing sports and lifting heavy objects.  Contact a health care provider if you have increased bruising, pain that does not go away, or a fever. Get help right away if you suddenly lose your vision, see flashes of light or spots, or have severe pain in the eye. This information is not intended to replace advice given to you by your health care provider. Make sure you discuss any questions you have with your health care provider. Document Revised: 12/05/2018 Document Reviewed: 08/08/2017 Elsevier Patient Education  [place].

## 2019-12-06 LAB — SARS CORONAVIRUS 2 (TAT 6-24 HRS): SARS Coronavirus 2: NEGATIVE

## 2019-12-07 ENCOUNTER — Encounter (HOSPITAL_COMMUNITY)
Admission: RE | Disposition: A | Discharge status: Home / Self Care | Payer: Self-pay | Source: Home / Self Care | Attending: Ophthalmology

## 2019-12-07 ENCOUNTER — Ambulatory Visit (HOSPITAL_COMMUNITY)
Admission: RE | Admit: 2019-12-07 | Discharge: 2019-12-07 | Disposition: A | Discharge status: Home / Self Care | Payer: Medicare Other | Attending: Ophthalmology | Admitting: Ophthalmology

## 2019-12-07 ENCOUNTER — Ambulatory Visit (HOSPITAL_COMMUNITY): Payer: Medicare Other | Admitting: Anesthesiology

## 2019-12-07 ENCOUNTER — Encounter (HOSPITAL_COMMUNITY): Payer: Self-pay | Admitting: Ophthalmology

## 2019-12-07 DIAGNOSIS — H0100A Unspecified blepharitis right eye, upper and lower eyelids: Secondary | ICD-10-CM | POA: Insufficient documentation

## 2019-12-07 DIAGNOSIS — H0100B Unspecified blepharitis left eye, upper and lower eyelids: Secondary | ICD-10-CM | POA: Insufficient documentation

## 2019-12-07 DIAGNOSIS — I10 Essential (primary) hypertension: Secondary | ICD-10-CM | POA: Insufficient documentation

## 2019-12-07 DIAGNOSIS — F419 Anxiety disorder, unspecified: Secondary | ICD-10-CM | POA: Insufficient documentation

## 2019-12-07 DIAGNOSIS — F32A Depression, unspecified: Secondary | ICD-10-CM | POA: Insufficient documentation

## 2019-12-07 DIAGNOSIS — Z79899 Other long term (current) drug therapy: Secondary | ICD-10-CM | POA: Insufficient documentation

## 2019-12-07 DIAGNOSIS — Z7984 Long term (current) use of oral hypoglycemic drugs: Secondary | ICD-10-CM | POA: Diagnosis not present

## 2019-12-07 DIAGNOSIS — G4730 Sleep apnea, unspecified: Secondary | ICD-10-CM | POA: Insufficient documentation

## 2019-12-07 DIAGNOSIS — M797 Fibromyalgia: Secondary | ICD-10-CM | POA: Diagnosis not present

## 2019-12-07 DIAGNOSIS — E1136 Type 2 diabetes mellitus with diabetic cataract: Secondary | ICD-10-CM | POA: Insufficient documentation

## 2019-12-07 DIAGNOSIS — H25812 Combined forms of age-related cataract, left eye: Secondary | ICD-10-CM | POA: Insufficient documentation

## 2019-12-07 DIAGNOSIS — K219 Gastro-esophageal reflux disease without esophagitis: Secondary | ICD-10-CM | POA: Insufficient documentation

## 2019-12-07 HISTORY — PX: CATARACT EXTRACTION W/PHACO: SHX586

## 2019-12-07 SURGERY — PHACOEMULSIFICATION, CATARACT, WITH IOL INSERTION
Anesthesia: Monitor Anesthesia Care | Site: Eye | Laterality: Left

## 2019-12-07 MED ORDER — POVIDONE-IODINE 5 % OP SOLN
OPHTHALMIC | Status: DC | PRN
  Administered 2019-12-07: 1 via OPHTHALMIC

## 2019-12-07 MED ORDER — CYCLOPENTOLATE-PHENYLEPHRINE 0.2-1 % OP SOLN
1.0000 [drp] | OPHTHALMIC | Status: AC | PRN
  Administered 2019-12-07 (×3): 1 [drp] via OPHTHALMIC

## 2019-12-07 MED ORDER — LIDOCAINE HCL (PF) 1 % IJ SOLN
INTRAOCULAR | Status: DC | PRN
  Administered 2019-12-07: 1 mL via OPHTHALMIC

## 2019-12-07 MED ORDER — MIDAZOLAM HCL 2 MG/2ML IJ SOLN
INTRAMUSCULAR | Status: AC
  Filled 2019-12-07: qty 2

## 2019-12-07 MED ORDER — MIDAZOLAM HCL 2 MG/2ML IJ SOLN
INTRAMUSCULAR | Status: DC | PRN
  Administered 2019-12-07: 2 mg via INTRAVENOUS

## 2019-12-07 MED ORDER — PROVISC 10 MG/ML IO SOLN
INTRAOCULAR | Status: DC | PRN
  Administered 2019-12-07: 0.85 mL via INTRAOCULAR

## 2019-12-07 MED ORDER — BSS IO SOLN
INTRAOCULAR | Status: DC | PRN
  Administered 2019-12-07: 15 mL via INTRAOCULAR

## 2019-12-07 MED ORDER — SODIUM HYALURONATE 23 MG/ML IO SOLN
INTRAOCULAR | Status: DC | PRN
  Administered 2019-12-07: 0.6 mL via INTRAOCULAR

## 2019-12-07 MED ORDER — TETRACAINE HCL 0.5 % OP SOLN
1.0000 [drp] | OPHTHALMIC | Status: AC | PRN
  Administered 2019-12-07 (×3): 1 [drp] via OPHTHALMIC

## 2019-12-07 MED ORDER — EPINEPHRINE PF 1 MG/ML IJ SOLN
INTRAOCULAR | Status: DC | PRN
  Administered 2019-12-07: 500 mL

## 2019-12-07 MED ORDER — LIDOCAINE HCL 3.5 % OP GEL
1.0000 "application " | Freq: Once | OPHTHALMIC | Status: AC
  Administered 2019-12-07: 1 via OPHTHALMIC

## 2019-12-07 MED ORDER — PHENYLEPHRINE HCL 2.5 % OP SOLN
1.0000 [drp] | OPHTHALMIC | Status: AC | PRN
  Administered 2019-12-07 (×3): 1 [drp] via OPHTHALMIC

## 2019-12-07 SURGICAL SUPPLY — 13 items
CLOTH BEACON ORANGE TIMEOUT ST (SAFETY) ×2 IMPLANT
EYE SHIELD UNIVERSAL CLEAR (GAUZE/BANDAGES/DRESSINGS) ×2 IMPLANT
GLOVE BIOGEL PI IND STRL 7.0 (GLOVE) ×2 IMPLANT
GLOVE BIOGEL PI INDICATOR 7.0 (GLOVE) ×4
LENS ALC ACRYL/TECN (Ophthalmic Related) ×2 IMPLANT
NDL HYPO 18GX1.5 BLUNT FILL (NEEDLE) IMPLANT
NEEDLE HYPO 18GX1.5 BLUNT FILL (NEEDLE) ×3 IMPLANT
PAD ARMBOARD 7.5X6 YLW CONV (MISCELLANEOUS) ×2 IMPLANT
SYR TB 1ML LL NO SAFETY (SYRINGE) ×3 IMPLANT
TAPE SURG TRANSPORE 1 IN (GAUZE/BANDAGES/DRESSINGS) IMPLANT
TAPE SURGICAL TRANSPORE 1 IN (GAUZE/BANDAGES/DRESSINGS) ×3
VISCOELASTIC ADDITIONAL (OPHTHALMIC RELATED) ×2 IMPLANT
WATER STERILE IRR 250ML POUR (IV SOLUTION) ×3 IMPLANT

## 2019-12-07 NOTE — Anesthesia Postprocedure Evaluation (Signed)
Anesthesia Post Note  Patient: Christina Lewis  Procedure(s) Performed: CATARACT EXTRACTION PHACO AND INTRAOCULAR LENS PLACEMENT (IOC) (Left Eye)  Patient location during evaluation: Phase II Anesthesia Type: MAC Level of consciousness: awake and alert, oriented and patient cooperative Pain management: pain level controlled Vital Signs Assessment: post-procedure vital signs reviewed and stable Respiratory status: spontaneous breathing, respiratory function stable and nonlabored ventilation Cardiovascular status: blood pressure returned to baseline and stable Anesthetic complications: no   No complications documented.   Last Vitals:  Vitals:   12/07/19 0812 12/07/19 0858  BP: 136/73 110/71  Pulse: 73 71  Resp:  18  Temp: 36.9 C 36.9 C  SpO2: 97% 97%    Last Pain:  Vitals:   12/07/19 0858  TempSrc: Oral  PainSc: 0-No pain                 Dustin Burrill, Arther Dames

## 2019-12-07 NOTE — Anesthesia Preprocedure Evaluation (Signed)
Anesthesia Evaluation  Patient identified by MRN, date of birth, ID band Patient awake    Reviewed: Allergy & Precautions, NPO status , Patient's Chart, lab work & pertinent test results  History of Anesthesia Complications (+) PONV and history of anesthetic complications  Airway Mallampati: III  TM Distance: >3 FB Neck ROM: Full    Dental  (+) Chipped, Dental Advisory Given   Pulmonary asthma , sleep apnea , pneumonia,    Pulmonary exam normal breath sounds clear to auscultation       Cardiovascular Exercise Tolerance: Good Normal cardiovascular exam Rhythm:Regular Rate:Normal     Neuro/Psych  Headaches, PSYCHIATRIC DISORDERS Depression  Neuromuscular disease    GI/Hepatic GERD  Medicated,  Endo/Other  diabetes, Well Controlled, Type 2, Oral Hypoglycemic Agents  Renal/GU      Musculoskeletal  (+) Arthritis  (BACK PAIN), Fibromyalgia -  Abdominal   Peds  Hematology   Anesthesia Other Findings   Reproductive/Obstetrics                           Anesthesia Physical Anesthesia Plan  ASA: III  Anesthesia Plan: MAC   Post-op Pain Management:    Induction:   PONV Risk Score and Plan:   Airway Management Planned: Nasal Cannula and Natural Airway  Additional Equipment:   Intra-op Plan:   Post-operative Plan:   Informed Consent:   Plan Discussed with:   Anesthesia Plan Comments:        Anesthesia Quick Evaluation

## 2019-12-07 NOTE — Interval H&P Note (Signed)
History and Physical Interval Note:  12/07/2019 8:31 AM  Christina Lewis  has presented today for surgery, with the diagnosis of Nuclear sclerotic cataract - Left eye.  The various methods of treatment have been discussed with the patient and family. After consideration of risks, benefits and other options for treatment, the patient has consented to  Procedure(s) with comments: CATARACT EXTRACTION PHACO AND INTRAOCULAR LENS PLACEMENT (IOC) (Left) - left as a surgical intervention.  The patient's history has been reviewed, patient examined, no change in status, stable for surgery.  I have reviewed the patient's chart and labs.  Questions were answered to the patient's satisfaction.     [name]

## 2019-12-07 NOTE — Op Note (Signed)
Date of procedure: 12/07/19  Pre-operative diagnosis: Visually significant age-related combined cataract, Left Eye (H25.812)  Post-operative diagnosis: Visually significant age-related combined cataract, Left Eye (H25.812)  Procedure: Removal of cataract via phacoemulsification and insertion of intra-ocular lens Wynetta Emery and [place]  +22.5D into the capsular bag of the Left Eye  Attending surgeon: Gerda Diss. Oran Dillenburg, MD, MA  Anesthesia: MAC, Topical [place]  Complications: None  Estimated Blood Loss: <87m (minimal)  Specimens: None  Implants: As above  Indications:  Visually significant age-related cataract, Left Eye  Procedure:  The patient was seen and identified in the pre-operative area. The operative eye was identified and dilated.  The operative eye was marked.  Topical anesthesia was administered to the operative eye.     The patient was then to the operative suite and placed in the supine position.  A timeout was performed confirming the patient, procedure to be performed, and all other relevant information.   The patient's face was prepped and draped in the usual fashion for intra-ocular surgery.  A lid speculum was placed into the operative eye and the surgical microscope moved into place and focused.  An inferotemporal paracentesis was created using a 20 gauge paracentesis blade.  Shugarcaine was injected into the anterior chamber.  Viscoelastic was injected into the anterior chamber.  A temporal clear-corneal main wound incision was created using a 2.418mmicrokeratome.  A continuous curvilinear capsulorrhexis was initiated using an irrigating cystitome and completed using capsulorrhexis forceps.  Hydrodissection and hydrodeliniation were performed.  Viscoelastic was injected into the anterior chamber.  A phacoemulsification handpiece and a chopper as a second instrument were used to remove the nucleus and epinucleus. The irrigation/aspiration handpiece was used to remove  any remaining cortical material.   The capsular bag was reinflated with viscoelastic, checked, and found to be intact.  The intraocular lens was inserted into the capsular bag.  The irrigation/aspiration handpiece was used to remove any remaining viscoelastic.  The clear corneal wound and paracentesis wounds were then hydrated and checked with Weck-Cels to be watertight.  The lid-speculum was removed.  The drape was removed.  The patient's face was cleaned with a wet and dry 4x4.  A clear shield was taped over the eye. The patient was taken to the post-operative care unit in good condition, having tolerated the procedure well.  Post-Op Instructions: [facility] and will receive all other orders and instructions.

## 2019-12-07 NOTE — Discharge Instructions (Signed)
Please discharge patient when stable, will follow up today with Dr. Shyteria Lewis at the [facility] office immediately following discharge.  Leave shield in place until visit.  All paperwork with discharge instructions will be given at the office.  [facility] Address:  [address]  [place], [place] [address]             Monitored Anesthesia Care, Care After These instructions provide you with information about caring for yourself after your procedure. Your health care provider may also give you more specific instructions. Your treatment has been planned according to current medical practices, but problems sometimes occur. Call your health care provider if you have any problems or questions after your procedure. What can I expect after the procedure? After your procedure, you may:  Feel sleepy for several hours.  Feel clumsy and have poor balance for several hours.  Feel forgetful about what happened after the procedure.  Have poor judgment for several hours.  Feel nauseous or vomit.  Have a sore throat if you had a breathing tube during the procedure. Follow these instructions at home: For at least 24 hours after the procedure:      Have a responsible adult stay with you. It is important to have someone help care for you until you are awake and alert.  Rest as needed.  Do not: ? Participate in activities in which you could fall or become injured. ? Drive. ? Use heavy machinery. ? Drink alcohol. ? Take sleeping pills or medicines that cause drowsiness. ? Make important decisions or sign legal documents. ? Take care of children on your own. Eating and drinking  Follow the diet that is recommended by your health care provider.  If you vomit, drink water, juice, or soup when you can drink without vomiting.  Make sure you have little or no nausea before eating solid foods. General instructions  Take over-the-counter and  prescription medicines only as told by your health care provider.  If you have sleep apnea, surgery and certain medicines can increase your risk for breathing problems. Follow instructions from your health care provider about wearing your sleep device: ? Anytime you are sleeping, including during daytime naps. ? While taking prescription pain medicines, sleeping medicines, or medicines that make you drowsy.  If you smoke, do not smoke without supervision.  Keep all follow-up visits as told by your health care provider. This is important. Contact a health care provider if:  You keep feeling nauseous or you keep vomiting.  You feel light-headed.  You develop a rash.  You have a fever. Get help right away if:  You have trouble breathing. Summary  For several hours after your procedure, you may feel sleepy and have poor judgment.  Have a responsible adult stay with you for at least 24 hours or until you are awake and alert. This information is not intended to replace advice given to you by your health care provider. Make sure you discuss any questions you have with your health care provider. Document Revised: 05/09/2017 Document Reviewed: 06/01/2015 Elsevier Patient Education  2020 Elsevier Inc.  

## 2019-12-07 NOTE — Transfer of Care (Signed)
Immediate Anesthesia Transfer of Care Note  Patient: Christina Lewis  Procedure(s) Performed: CATARACT EXTRACTION PHACO AND INTRAOCULAR LENS PLACEMENT (IOC) (Left Eye)  Patient Location: PACU  Anesthesia Type:MAC  Level of Consciousness: awake, alert , oriented and patient cooperative  Airway & Oxygen Therapy: Patient Spontanous Breathing  Post-op Assessment: Report given to RN, Post -op Vital signs reviewed and stable and Patient moving all extremities  Post vital signs: Reviewed and stable  Last Vitals:  Vitals Value Taken Time  BP    Temp    Pulse    Resp    SpO2      Last Pain:  Vitals:   12/07/19 0812  TempSrc: Oral      Patients Stated Pain Goal: 7 (15/94/70 7615)  Complications: No complications documented.

## 2019-12-10 ENCOUNTER — Encounter (HOSPITAL_COMMUNITY): Payer: Self-pay | Admitting: Ophthalmology

## 2019-12-11 ENCOUNTER — Other Ambulatory Visit (HOSPITAL_COMMUNITY): Payer: BC Managed Care – PPO

## 2019-12-12 ENCOUNTER — Other Ambulatory Visit: Payer: Self-pay | Admitting: Physician Assistant

## 2019-12-12 NOTE — Telephone Encounter (Signed)
Last Visit: 07/31/2019 Next Visit: 01/01/2020  Last Fill: 11/09/2019   Okay to refill Tizanidine and Baclofen?

## 2019-12-14 ENCOUNTER — Other Ambulatory Visit (HOSPITAL_COMMUNITY): Payer: BC Managed Care – PPO

## 2019-12-17 ENCOUNTER — Ambulatory Visit: Admit: 2019-12-17 | Payer: BC Managed Care – PPO | Admitting: Ophthalmology

## 2019-12-17 SURGERY — PHACOEMULSIFICATION, CATARACT, WITH IOL INSERTION
Anesthesia: Monitor Anesthesia Care | Laterality: Right

## 2019-12-18 NOTE — Progress Notes (Signed)
Office Visit Note  Patient: Christina Lewis             Date of Birth: 1955-12-19           MRN: [id number]             PCP: Kirstie Peri, MD Referring: Kirstie Peri, MD Visit Date: 01/01/2020 Occupation: @GUAROCC @  Subjective:  Pain in both hands  History of Present Illness: [name] is a 64 y.o. female with history of seronegative rheumatoid arthritis, osteoarthritis, and fibromyalgia.  Patient is taking Plaquenil 200 mg 1 tablet by mouth twice daily Monday through Friday.  She continues to tolerate Plaquenil and has not missed any doses recently.  She reports that she continues to experience an intermittent throbbing pain in both wrists and both hands but has not noticed any joint swelling.  Her stiffness and discomfort is worse first thing in the morning.  She continues to have chronic lower back pain and is followed closely by Dr. Tuesday.  She has an upcoming MRI and will be following up with Dr. Venetia Maxon on 01/16/2020 to discuss the results.  She continues to take hydrocodone as needed for pain relief. She denies any recent infections.  She has not received the COVID-19 vaccinations and does not plan to at this time.    Activities of Daily Living:  Patient reports joint stiffness all day  Patient Reports nocturnal pain.  Difficulty dressing/grooming: Denies Difficulty climbing stairs: Reports Difficulty getting out of chair: Reports Difficulty using hands for taps, buttons, cutlery, and/or writing: Reports  Review of Systems  Constitutional: Positive for fatigue.  HENT: Negative for mouth sores, mouth dryness and nose dryness.   Eyes: Positive for pain, itching, visual disturbance and dryness.  Respiratory: Negative for cough, hemoptysis, shortness of breath and difficulty breathing.   Cardiovascular: Negative for chest pain, palpitations and swelling in legs/feet.  Gastrointestinal: Negative for abdominal pain, blood in stool, constipation and diarrhea.  Endocrine:  Negative for increased urination.  Genitourinary: Negative for painful urination.  Musculoskeletal: Positive for arthralgias, joint pain, joint swelling, myalgias, muscle weakness, morning stiffness, muscle tenderness and myalgias.  Skin: Positive for color change. Negative for rash and redness.  Allergic/Immunologic: Negative for susceptible to infections.  Neurological: Positive for weakness. Negative for dizziness, numbness, headaches and memory loss.  Hematological: Negative for swollen glands.  Psychiatric/Behavioral: Positive for sleep disturbance. Negative for confusion.    PMFS History:  Patient Active Problem List   Diagnosis Date Noted  . 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 myelopathy 09/11/2019  . Leukocytosis 11/04/2016  . Fibromyalgia 02/10/2016  . Other fatigue 02/10/2016  . Primary osteoarthritis of both hands 02/10/2016  . History of migraine 02/10/2016  . History of thyroid nodule 02/10/2016  . History of sleep apnea 02/10/2016  . History of renal calculi 02/10/2016  . Pain in joint, shoulder region 10/02/2010  . Adhesive capsulitis of shoulder 10/02/2010  . Muscle weakness (generalized) 10/02/2010    Past Medical History:  Diagnosis Date  . Arthritis   . Asthma 2007  . Depression   . Diabetes mellitus   . Fibromyalgia   . GERD (gastroesophageal reflux disease)   . Gout   . Hematuria   . History of kidney stones   . History of tics   . Interstitial cystitis 2016  . Migraines   . Osteoarthritis   . PCOS (polycystic ovarian syndrome) 1993  . Pneumonia 2019  . PONV (postoperative nausea and vomiting)   . Sleep apnea   .  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 Thyroid nodule     Family History  Problem Relation Age of Onset  . Fibromyalgia Mother   . Psoriasis Mother        psoriatic arthritis   . Diabetes Mother   . Heart disease Mother   . Psoriasis Sister        psoriatic arthritis   . Diabetes Sister     . Rheum arthritis Sister   . Fibromyalgia Sister   . Diabetes Sister    Past Surgical History:  Procedure Laterality Date  . ABDOMINAL HYSTERECTOMY  2000  . APPENDECTOMY  1976  . BREAST LUMPECTOMY  1989   lt-negative  . CARDIOVASCULAR STRESS TEST  2000  . CATARACT EXTRACTION W/PHACO Left 12/07/2019   Procedure: CATARACT EXTRACTION PHACO AND INTRAOCULAR LENS PLACEMENT (IOC);  Surgeon: [name], MD;  Location: [facility];  Service: Ophthalmology;  Laterality: Left;  CDE: 5.55  . CHOLECYSTECTOMY  1985  . COLONOSCOPY    . HYSTERECTOMY ABDOMINAL WITH SALPINGECTOMY Bilateral 2000  . KIDNEY STONE SURGERY Right 2014   with stent placement in OR  . KNEE ARTHROSCOPY Right 08/22/2013   Procedure: ARTHROSCOPY RIGHT KNEE FOR INFECTION LAVAGE AND DRAINAGE;  Surgeon: Thera Flake., MD;  Location: [facility];  Service: Orthopedics;  Laterality: Right;  . KNEE ARTHROSCOPY WITH PATELLA RECONSTRUCTION Right 08/06/2013   Procedure: RIGHT KNEE ARTHROSCOPY WITH MENISCECTOMY MEDIAL, ARTHROSCOPY KNEE WITH DEBRIDEMENT/SHAVING (CHONDROPLASTY) ;  Surgeon: Thera Flake., MD;  Location: [facility];  Service: Orthopedics;  Laterality: Right;  . LUMBAR LAMINECTOMY/DECOMPRESSION MICRODISCECTOMY Right 09/11/2019   Procedure: Right Lumbar Three-Four Microdiscectomy;  Surgeon: Maeola Harman, MD;  Location: [facility];  Service: Neurosurgery;  Laterality: Right;  Right Lumbar Three-Four Microdiscectomy  . NASAL SINUS SURGERY     x4  . OOPHORECTOMY    . SHOULDER SURGERY  2011   right shoulder  . THYROID SURGERY  1994  . TONSILLECTOMY Bilateral 1974   Social History   Social History Narrative  . Not on file    There is no immunization history on file for this patient.   Objective: Vital Signs: BP 107/66 (BP Location: Left Arm, Patient Position: Sitting, Cuff Size: Small)   Pulse 71   Ht 5\' 4"  (1.626 m)   Wt 172 lb 9.6 oz (78.3 kg)   BMI 29.63 kg/m    Physical Exam Vitals and  nursing note reviewed.  Constitutional:      Appearance: She is well-developed.  HENT:     Head: Normocephalic and atraumatic.  Eyes:     Conjunctiva/sclera: Conjunctivae normal.  Pulmonary:     Effort: Pulmonary effort is normal.  Abdominal:     Palpations: Abdomen is soft.  Musculoskeletal:     Cervical back: Normal range of motion.  Skin:    General: Skin is warm and dry.     Capillary Refill: Capillary refill takes less than 2 seconds.  Neurological:     Mental Status: She is alert and oriented to person, place, and time.  Psychiatric:        Behavior: Behavior normal.      Musculoskeletal Exam: C-spine, thoracic spine, and lumbar spine good ROM.  Shoulder joints, elbow joints, wrist joints, MCPs, PIPs, and DIPs good ROM with no synovitis. Tenderness over both wrist joints.  Tenderness of the right 2nd and 3rd MCPs and 3rd PIP joint.  Hip joints  good ROM.  Tenderness over the left trochanteric bursa.  Knee joints good ROM with no warmth or effusion.  Ankle joints good ROM with no tenderness or inflammation. Hammertoes noted.    CDAI Exam: CDAI Score: 5.8  Patient Global: 4 mm; Provider Global: 4 mm Swollen: 0 ; Tender: 5  Joint Exam 01/01/2020      Right  Left  Wrist   Tender   Tender  MCP 2   Tender     MCP 3   Tender     PIP 3   Tender        Investigation: No additional findings.  Imaging: No results found.  Recent Labs: Lab Results  Component Value Date   WBC 9.6 10/31/2019   HGB 13.8 10/31/2019   PLT 310 10/31/2019   NA 141 10/31/2019   K 4.0 10/31/2019   CL 108 10/31/2019   CO2 26 10/31/2019   GLUCOSE 81 10/31/2019   BUN 24 10/31/2019   CREATININE 0.82 10/31/2019   BILITOT 0.2 10/31/2019   ALKPHOS 59 11/04/2016   AST 21 10/31/2019   ALT 31 (H) 10/31/2019   PROT 6.6 10/31/2019   ALBUMIN 4.0 11/04/2016   CALCIUM 9.9 10/31/2019   GFRAA 89 10/31/2019    Speciality Comments: PLQ Eye Exam: 07/31/2019 @ [facility]   Procedures:  No  procedures performed Allergies: Sulfa antibiotics, Codeine, Erythromycin, Metronidazole, and Morphine and related   Assessment / Plan:     Visit Diagnoses: Rheumatoid arthritis of multiple sites with negative rheumatoid factor (HCC) - RF-, CCP-, 14-3-3 eta positive: She has no synovitis on exam.  She has not had any recent rheumatoid arthritis flares.  She does experience pain and stiffness in both wrists and both hands intermittently.  She describes the pain as a throbbing sensation but has not noticed any joint swelling.  She has tenderness palpation over bilateral wrist joints and the right second and third MCPs and third PIP joints on examination today.  She is currently taking Plaquenil 200 mg 1 tablet by mouth twice daily Monday through Friday.  She is tolerating Plaquenil without any side effects and has not missed any doses recently.  She has been taking hydrocodone or acetaminophen as needed for pain relief.  She had a mild increase in ALT with recent lab work on 10/31/2019 so we will recheck LFTs today.  We discussed the importance of only taking Tylenol and NSAIDs as needed.  We discussed the use of topical agents like Voltaren gel which she can use topically as needed for pain relief.  I do not see any inflammation on examination today.  She was advised to notify us if her joint pain persists or worsens or if she develops joint swelling and we will schedule an ultrasound to assess for synovitis.  She will continue taking Plaquenil as prescribed.  She will follow-up in the office in 3 months.  High risk medication use - PLQ 200 mg 1 tablet BID M-F. PLQ Eye Exam: 07/31/2019 @ [facility].  CBC and CMP were drawn on 10/31/2019.  ALT was borderline elevated.  We will recheck LFTs today.  She was encouraged to only take Tylenol and NSAIDs as needed.- Plan: AST, ALT She has not had any recent infections.  She has not received the COVID-19 vaccinations and does not plan to at this time.  She was  advised to notify us if she develops a COVID-19 infection.  She was encouraged to continue to wear  her mask and social distance.  Elevated LFTs - ALT was 31 on 10/31/19.  We will recheck LFTs today. Plan: AST, ALT   Primary osteoarthritis of both hands: She has PIP and DIP thickening consistent with osteoarthritis of both hands.  No joint tenderness or inflammation was noted on examination today.  She has been experiencing a throbbing sensation in both hands and both wrist joints likely due to underlying osteoarthritis.  Rheumatoid arthritis is well controlled on Plaquenil.  We discussed the importance of joint protection and muscle strengthening.  Primary osteoarthritis of right knee: Doing well.  She has good range of motion with no discomfort.  No warmth or effusion on exam.  Trochanteric bursitis of right hip: Chronic.  She has tenderness palpation on examination today.  We discussed the importance of performing stretching exercises on a daily basis.  Chronic SI joint pain: She has no SI joint tenderness palpation on examination today.  She has been experiencing right-sided lower back pain which she attributes to prior surgery of the lumbar spine.  She is followed closely by Dr. Venetia Maxon and has an upcoming MRI of the lumbar spine.  DDD (degenerative disc disease), lumbar: Chronic pain.  She is followed closely by Dr. Venetia Maxon.  She has not coming MRI of the lumbar spine and has an upcoming appointment with Dr. Venetia Maxon on 01/16/2020 to review these results.  She takes hydrocodone as needed for pain relief.  Fibromyalgia: She has generalized hyperalgesia and positive tender points on examination today.  She continues to experience generalized myalgias and muscle tenderness due to fibromyalgia.  Her myalgias have been more frequent recently which she attributes to weather changes.  We discussed the importance of regular exercise and good sleep hygiene.  She plans on continuing to take hydrocodone as needed for  moderate to severe pain relief.  Other fatigue chronic but stable.  We discussed importance of regular exercise.  Other medical conditions are listed as follows:   History of gastroesophageal reflux (GERD)  History of positive PPD  History of renal calculi  History of tics  History of thyroid nodule  History of sleep apnea  History of PCOS  History of migraine  History of depression  History of diabetes mellitus    Orders: Orders Placed This Encounter  Procedures  . AST  . ALT   No orders of the defined types were placed in this encounter.     Follow-Up Instructions: Return in about 3 months (around 04/02/2020) for Rheumatoid arthritis.   Gearldine Bienenstock, PA-C  Note - This record has been created using Dragon software.  Chart creation errors have been sought, but may not always  have been located. Such creation errors do not reflect on  the standard of medical care.

## 2019-12-25 ENCOUNTER — Encounter (HOSPITAL_COMMUNITY): Payer: BC Managed Care – PPO

## 2019-12-26 ENCOUNTER — Other Ambulatory Visit: Payer: Self-pay | Admitting: *Deleted

## 2019-12-26 ENCOUNTER — Telehealth: Payer: Self-pay

## 2019-12-26 DIAGNOSIS — Z79899 Other long term (current) drug therapy: Secondary | ICD-10-CM

## 2019-12-26 NOTE — Telephone Encounter (Signed)
Lab Orders released.  

## 2019-12-26 NOTE — Telephone Encounter (Signed)
Patient called requesting labwork orders be sent to [facility] in [place].   

## 2019-12-28 ENCOUNTER — Other Ambulatory Visit (HOSPITAL_COMMUNITY)
Admission: RE | Admit: 2019-12-28 | Discharge: 2019-12-28 | Disposition: A | Discharge status: Home / Self Care | Payer: BC Managed Care – PPO | Source: Ambulatory Visit | Attending: Ophthalmology | Admitting: Ophthalmology

## 2019-12-31 ENCOUNTER — Ambulatory Visit (HOSPITAL_COMMUNITY): Admission: RE | Admit: 2019-12-31 | Payer: Medicare Other | Source: Home / Self Care | Admitting: Ophthalmology

## 2019-12-31 ENCOUNTER — Encounter (HOSPITAL_COMMUNITY): Admission: RE | Payer: Self-pay | Source: Home / Self Care

## 2019-12-31 SURGERY — PHACOEMULSIFICATION, CATARACT, WITH IOL INSERTION
Anesthesia: Monitor Anesthesia Care | Laterality: Right

## 2020-01-01 ENCOUNTER — Ambulatory Visit (INDEPENDENT_AMBULATORY_CARE_PROVIDER_SITE_OTHER): Payer: Medicare Other | Admitting: Physician Assistant

## 2020-01-01 ENCOUNTER — Other Ambulatory Visit: Payer: Self-pay

## 2020-01-01 ENCOUNTER — Encounter: Payer: Self-pay | Admitting: Physician Assistant

## 2020-01-01 VITALS — BP 107/66 | HR 71 | Ht 64.0 in | Wt 172.6 lb

## 2020-01-01 DIAGNOSIS — M797 Fibromyalgia: Secondary | ICD-10-CM

## 2020-01-01 DIAGNOSIS — M1711 Unilateral primary osteoarthritis, right knee: Secondary | ICD-10-CM

## 2020-01-01 DIAGNOSIS — M19041 Primary osteoarthritis, right hand: Secondary | ICD-10-CM | POA: Diagnosis not present

## 2020-01-01 DIAGNOSIS — R7989 Other specified abnormal findings of blood chemistry: Secondary | ICD-10-CM

## 2020-01-01 DIAGNOSIS — M0609 Rheumatoid arthritis without rheumatoid factor, multiple sites: Secondary | ICD-10-CM | POA: Diagnosis not present

## 2020-01-01 DIAGNOSIS — Z79899 Other long term (current) drug therapy: Secondary | ICD-10-CM

## 2020-01-01 DIAGNOSIS — M533 Sacrococcygeal disorders, not elsewhere classified: Secondary | ICD-10-CM

## 2020-01-01 DIAGNOSIS — R5383 Other fatigue: Secondary | ICD-10-CM

## 2020-01-01 DIAGNOSIS — Z87442 Personal history of urinary calculi: Secondary | ICD-10-CM

## 2020-01-01 DIAGNOSIS — Z8659 Personal history of other mental and behavioral disorders: Secondary | ICD-10-CM

## 2020-01-01 DIAGNOSIS — M5136 Other intervertebral disc degeneration, lumbar region: Secondary | ICD-10-CM

## 2020-01-01 DIAGNOSIS — Z8719 Personal history of other diseases of the digestive system: Secondary | ICD-10-CM

## 2020-01-01 DIAGNOSIS — Z8742 Personal history of other diseases of the female genital tract: Secondary | ICD-10-CM

## 2020-01-01 DIAGNOSIS — Z8669 Personal history of other diseases of the nervous system and sense organs: Secondary | ICD-10-CM

## 2020-01-01 DIAGNOSIS — M7061 Trochanteric bursitis, right hip: Secondary | ICD-10-CM

## 2020-01-01 DIAGNOSIS — Z9289 Personal history of other medical treatment: Secondary | ICD-10-CM

## 2020-01-01 DIAGNOSIS — Z8639 Personal history of other endocrine, nutritional and metabolic disease: Secondary | ICD-10-CM

## 2020-01-01 DIAGNOSIS — M19042 Primary osteoarthritis, left hand: Secondary | ICD-10-CM

## 2020-01-01 DIAGNOSIS — G8929 Other chronic pain: Secondary | ICD-10-CM

## 2020-01-01 DIAGNOSIS — M51369 Other intervertebral disc degeneration, lumbar region without mention of lumbar back pain or lower extremity pain: Secondary | ICD-10-CM

## 2020-01-02 LAB — AST: AST: 21 U/L (ref 10–35)

## 2020-01-02 LAB — ALT: ALT: 24 U/L (ref 6–29)

## 2020-01-02 NOTE — Progress Notes (Signed)
LFTs WNL ?

## 2020-01-12 ENCOUNTER — Other Ambulatory Visit: Payer: Self-pay | Admitting: Physician Assistant

## 2020-01-14 NOTE — Telephone Encounter (Signed)
Last Visit: 01/01/2020 Next Visit: 04/02/2020  Last Fill: 12/12/2019  Okay to refill Baclofen and Tizanidine?

## 2020-01-24 ENCOUNTER — Ambulatory Visit: Payer: BC Managed Care – PPO | Admitting: Neurology

## 2020-01-24 IMAGING — US ULTRASOUND LEFT BREAST LIMITED
1 series · 8 of 8 positions shown · non-contrast
Comparison: Previous exam(s).

CLINICAL DATA: Screening recall for a possible left breast mass.
The patient does take a daily aspirin and has possibly had a recent
bump to the left breast.

EXAM:
DIGITAL DIAGNOSTIC LEFT MAMMOGRAM WITH CAD AND TOMO
ULTRASOUND LEFT BREAST

[Series 1: ultrasound left breast limited · 0.05mm/px · 8 of 8 slices shown]
[im 1/8]
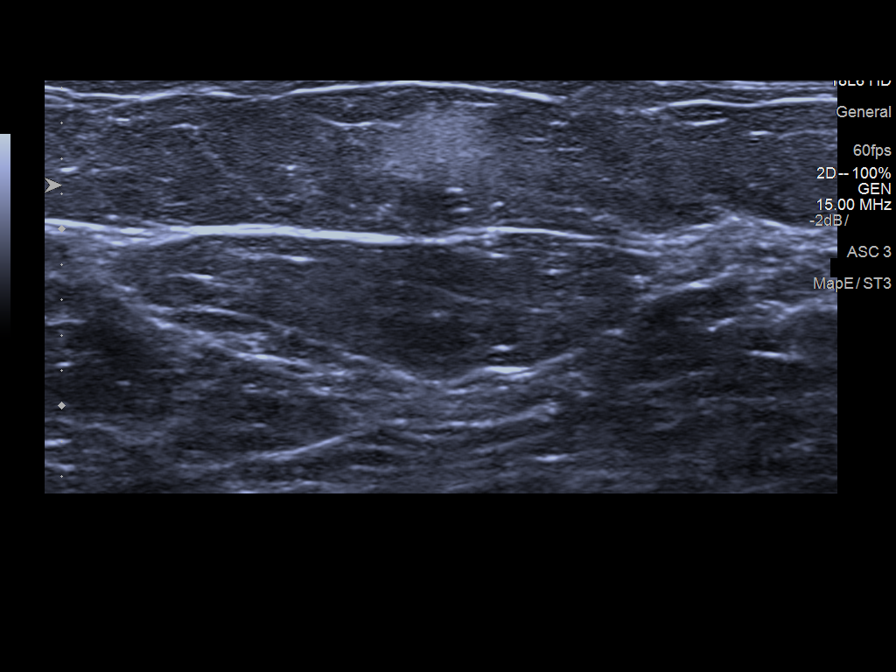
[im 2/8]
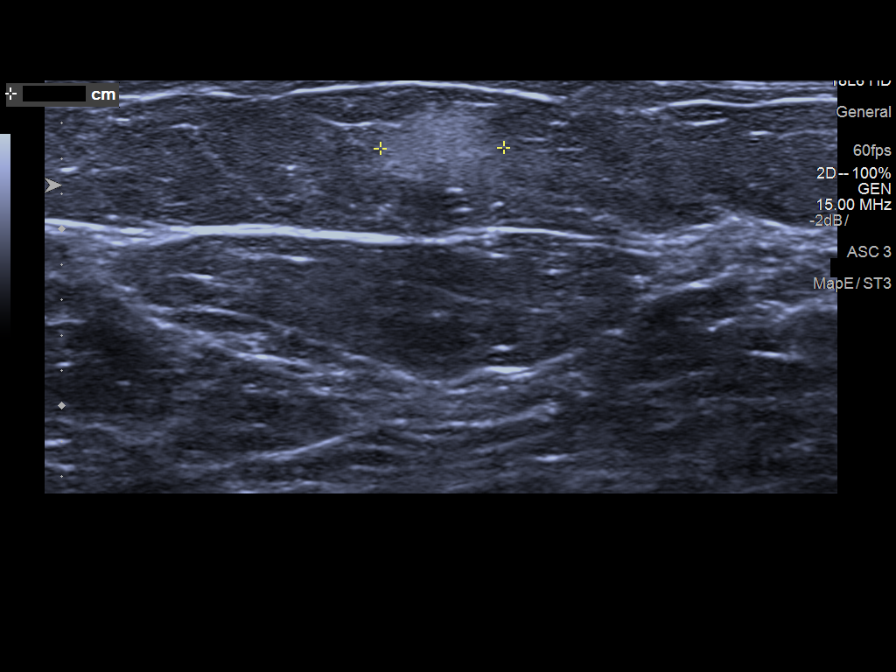
[im 3/8]
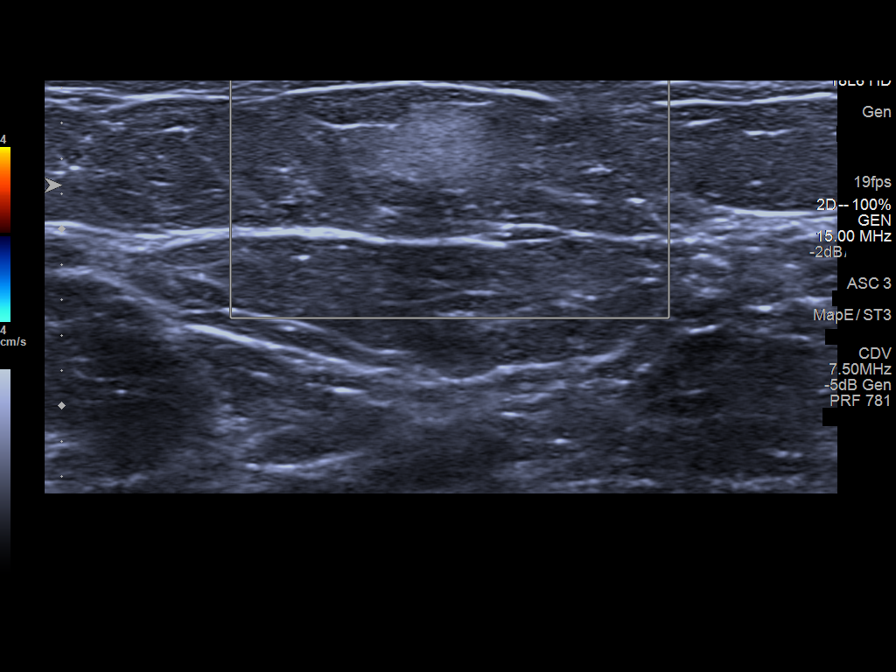
[im 4/8]
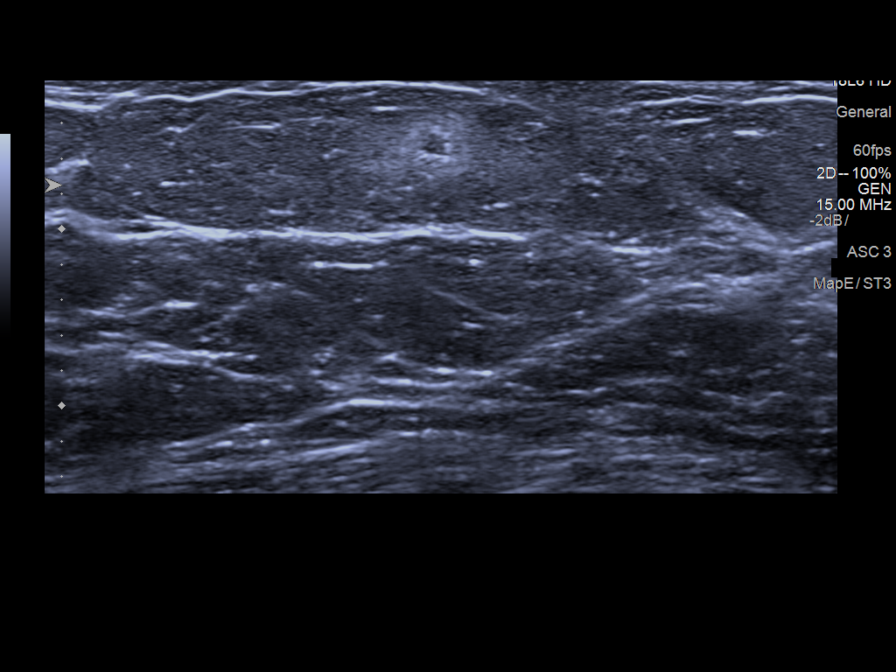
[im 5/8]
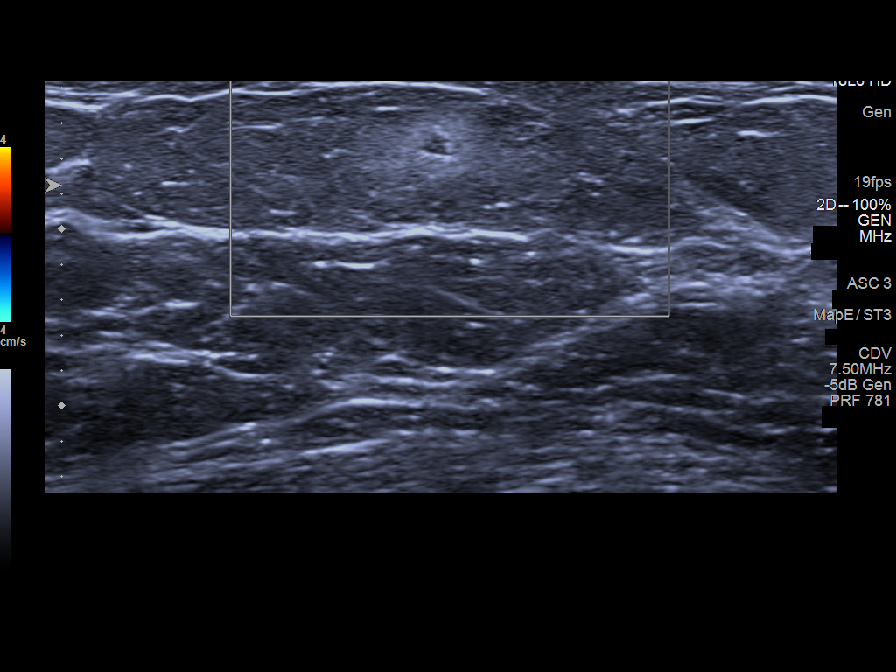
[im 6/8]
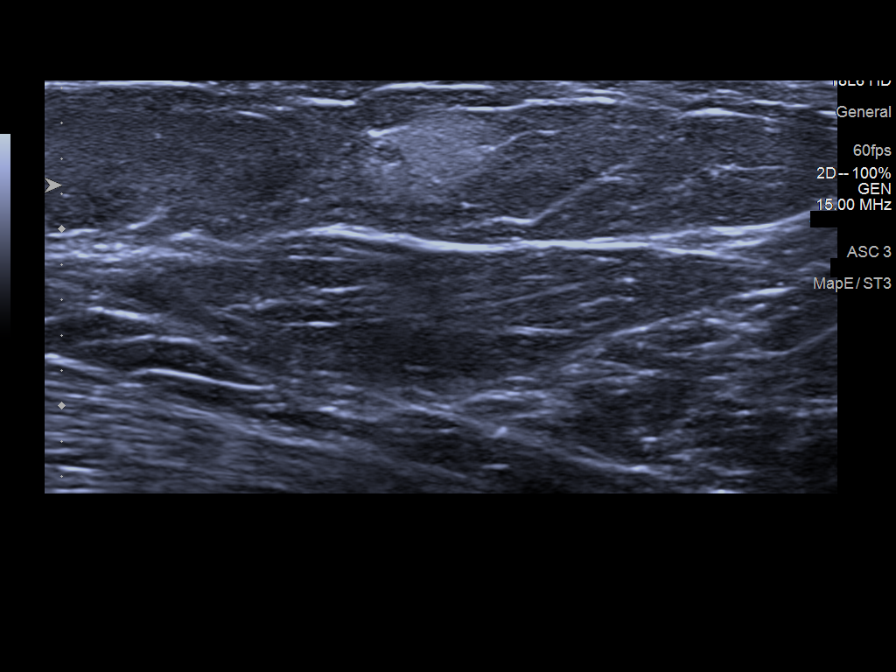
[im 7/8]
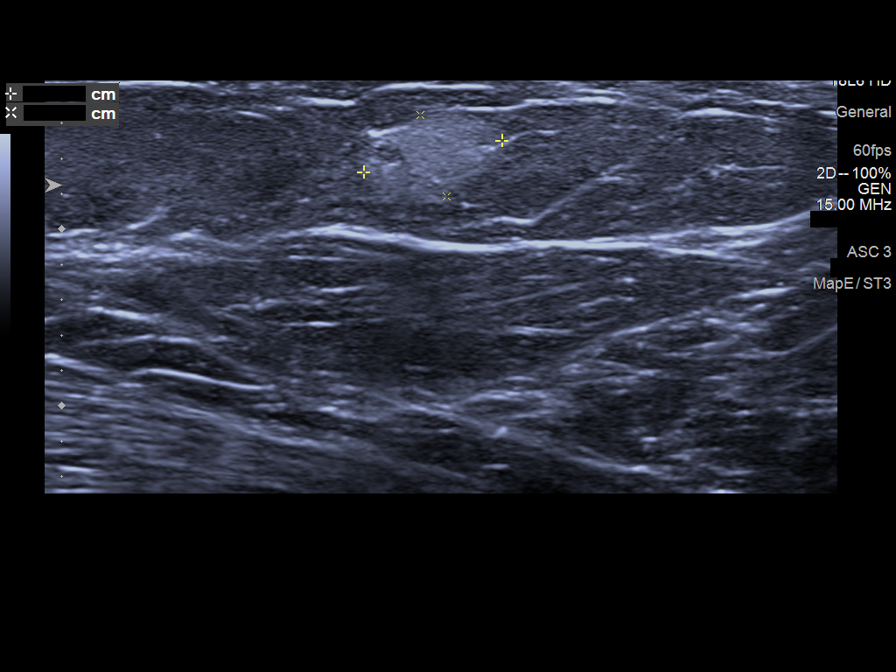
[im 8/8]
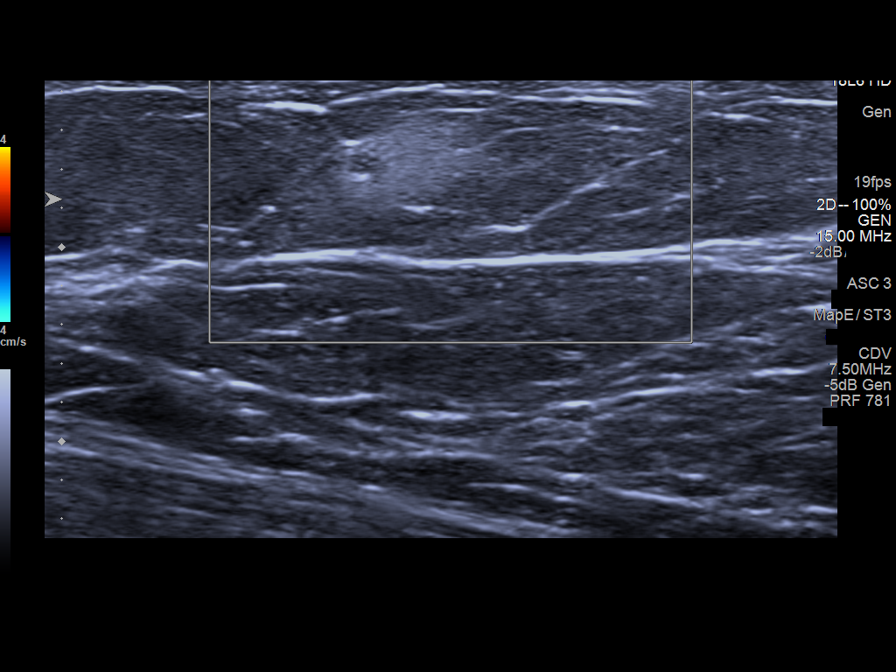

[8 of 8 positions shown; findings below may reference images not displayed]

ACR Breast Density Category b: There are scattered areas of
fibroglandular density.
FINDINGS: Spot compression tomosynthesis images of the superior posterior left
breast demonstrates a persistent asymmetry about 5 mm. On the spot
compression tomosynthesis image in the MLO projection, as well as
the full paddle CC projection acquired during the screening
mammogram, there does appear to be a circumscribed round focus of
macroscopic fat with in the asymmetry, which could possibly
represent fat necrosis.

Mammographic images were processed with CAD.

Ultrasound of left breast at 12 o'clock, 8 cm from the nipple
demonstrates an echogenic mass with a central hypoechoic mass likely
corresponding with the macroscopic fat identified on the mammogram.
This area measures 0.8 x 0.5 x 0.7 cm.
IMPRESSION: 1. There is a mass in the left breast at 12 o'clock, likely
representing fat necrosis. This is probably benign.

RECOMMENDATION:
Three-month follow-up left breast mammogram and possible ultrasound
is recommended.

I have discussed the findings and recommendations with the patient.
Results were also provided in writing at the conclusion of the
visit. If applicable, a reminder letter will be sent to the patient
regarding the next appointment.

BI-RADS CATEGORY  3: Probably benign.

## 2020-02-04 ENCOUNTER — Ambulatory Visit (INDEPENDENT_AMBULATORY_CARE_PROVIDER_SITE_OTHER): Payer: Medicare Other | Admitting: Physician Assistant

## 2020-02-04 ENCOUNTER — Other Ambulatory Visit: Payer: Self-pay

## 2020-02-04 ENCOUNTER — Encounter: Payer: Self-pay | Admitting: Physician Assistant

## 2020-02-04 VITALS — BP 146/91 | HR 81 | Resp 15 | Ht 64.0 in | Wt 170.6 lb

## 2020-02-04 DIAGNOSIS — Z79899 Other long term (current) drug therapy: Secondary | ICD-10-CM

## 2020-02-04 DIAGNOSIS — R202 Paresthesia of skin: Secondary | ICD-10-CM

## 2020-02-04 DIAGNOSIS — Z9289 Personal history of other medical treatment: Secondary | ICD-10-CM

## 2020-02-04 DIAGNOSIS — M797 Fibromyalgia: Secondary | ICD-10-CM

## 2020-02-04 DIAGNOSIS — G8929 Other chronic pain: Secondary | ICD-10-CM

## 2020-02-04 DIAGNOSIS — M19042 Primary osteoarthritis, left hand: Secondary | ICD-10-CM

## 2020-02-04 DIAGNOSIS — R7989 Other specified abnormal findings of blood chemistry: Secondary | ICD-10-CM

## 2020-02-04 DIAGNOSIS — M0609 Rheumatoid arthritis without rheumatoid factor, multiple sites: Secondary | ICD-10-CM | POA: Diagnosis not present

## 2020-02-04 DIAGNOSIS — M1711 Unilateral primary osteoarthritis, right knee: Secondary | ICD-10-CM | POA: Diagnosis not present

## 2020-02-04 DIAGNOSIS — Z8742 Personal history of other diseases of the female genital tract: Secondary | ICD-10-CM

## 2020-02-04 DIAGNOSIS — M19041 Primary osteoarthritis, right hand: Secondary | ICD-10-CM

## 2020-02-04 DIAGNOSIS — M5136 Other intervertebral disc degeneration, lumbar region: Secondary | ICD-10-CM

## 2020-02-04 DIAGNOSIS — Z87442 Personal history of urinary calculi: Secondary | ICD-10-CM

## 2020-02-04 DIAGNOSIS — Z8669 Personal history of other diseases of the nervous system and sense organs: Secondary | ICD-10-CM

## 2020-02-04 DIAGNOSIS — R5383 Other fatigue: Secondary | ICD-10-CM

## 2020-02-04 DIAGNOSIS — M7061 Trochanteric bursitis, right hip: Secondary | ICD-10-CM

## 2020-02-04 DIAGNOSIS — Z8719 Personal history of other diseases of the digestive system: Secondary | ICD-10-CM

## 2020-02-04 DIAGNOSIS — M533 Sacrococcygeal disorders, not elsewhere classified: Secondary | ICD-10-CM

## 2020-02-04 DIAGNOSIS — Z8659 Personal history of other mental and behavioral disorders: Secondary | ICD-10-CM

## 2020-02-04 DIAGNOSIS — Z8639 Personal history of other endocrine, nutritional and metabolic disease: Secondary | ICD-10-CM

## 2020-02-04 MED ORDER — PREDNISONE 5 MG PO TABS: ORAL_TABLET | ORAL | 0 refills | Status: DC

## 2020-02-04 NOTE — Progress Notes (Signed)
Office Visit Note  Patient: Christina Lewis             Date of Birth: 06/26/1955           MRN: [id number]             PCP: Kirstie Peri, MD Referring: Kirstie Peri, MD Visit Date: 02/04/2020 Occupation: @GUAROCC @  Subjective:  Right hand pain and numbness   History of Present Illness: HELANE BRICENO is a 64 y.o. female with history of seronegative rheumatoid arthritis, osteoarthritis, and fibromyalgia.  She is taking plaquenil 200 mg 1 tablet by mouth twice daily M-F.  She is tolerating Plaquenil without any side effects.  She denies missing any doses of Plaquenil recently.  Patient reports that she has been experiencing progressively worsening paresthesias in both hands for the past 1 month.  She denies any overuse activities.  She has been wearing carpal tunnel night splints without any relief.  She is experiencing pain and has noticed intermittent swelling in both wrist joints.  She is also been experiencing increased pain and swelling in the right ankle joint and left fourth toe.  She has been using Voltaren gel topically as needed for pain relief.  She denies taking any other over-the-counter products for pain relief. She continues to experience myalgias and muscle tenderness due to underlying fibromyalgia.  She has been having more frequent fibromyalgia flares.  She continues to take cymbalta 120 mg daily.  She denies any recent infections.     Activities of Daily Living:  Patient reports joint stiffness all day  Patient Reports nocturnal pain.  Difficulty dressing/grooming: Reports Difficulty climbing stairs: Reports Difficulty getting out of chair: Reports Difficulty using hands for taps, buttons, cutlery, and/or writing: Reports  Review of Systems  Constitutional: Positive for fatigue.  HENT: Positive for mouth sores, mouth dryness and nose dryness.   Eyes: Positive for dryness. Negative for pain and visual disturbance.  Respiratory: Negative for cough, hemoptysis,  shortness of breath and difficulty breathing.   Cardiovascular: Negative for chest pain, palpitations, hypertension and swelling in legs/feet.  Gastrointestinal: Positive for constipation and diarrhea. Negative for abdominal pain and blood in stool.  Endocrine: Negative for increased urination.  Genitourinary: Negative for painful urination.  Musculoskeletal: Positive for arthralgias, joint pain, joint swelling, myalgias, morning stiffness, muscle tenderness and myalgias.  Skin: Positive for color change. Negative for pallor, rash, hair loss, nodules/bumps, skin tightness, ulcers and sensitivity to sunlight.  Allergic/Immunologic: Negative for susceptible to infections.  Neurological: Positive for numbness. Negative for dizziness, headaches and weakness.  Hematological: Negative for swollen glands.  Psychiatric/Behavioral: Positive for depressed mood and sleep disturbance. The patient is not nervous/anxious.     PMFS History:  Patient Active Problem List   Diagnosis Date Noted  . 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 myelopathy 09/11/2019  . Leukocytosis 11/04/2016  . Fibromyalgia 02/10/2016  . Other fatigue 02/10/2016  . Primary osteoarthritis of both hands 02/10/2016  . History of migraine 02/10/2016  . History of thyroid nodule 02/10/2016  . History of sleep apnea 02/10/2016  . History of renal calculi 02/10/2016  . Pain in joint, shoulder region 10/02/2010  . Adhesive capsulitis of shoulder 10/02/2010  . Muscle weakness (generalized) 10/02/2010    Past Medical History:  Diagnosis Date  . Arthritis   . Asthma 2007  . Depression   . Diabetes mellitus   . Fibromyalgia   . GERD (gastroesophageal reflux disease)   . Gout   . Hematuria   . History of  kidney stones   . History of tics   . Interstitial cystitis 2016  . Migraines   . Osteoarthritis   . PCOS (polycystic ovarian syndrome) 1993  . Pneumonia 2019  . PONV (postoperative nausea and vomiting)   . Sleep apnea   . 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 Thyroid nodule     Family History  Problem Relation Age of Onset  . Fibromyalgia Mother   . Psoriasis Mother        psoriatic arthritis   . Diabetes Mother   . Heart disease Mother   . Psoriasis Sister        psoriatic arthritis   . Diabetes Sister   . Rheum arthritis Sister   . Fibromyalgia Sister   . Diabetes Sister    Past Surgical History:  Procedure Laterality Date  . ABDOMINAL HYSTERECTOMY  2000  . APPENDECTOMY  1976  . BREAST LUMPECTOMY  1989   lt-negative  . CARDIOVASCULAR STRESS TEST  2000  . CATARACT EXTRACTION W/PHACO Left 12/07/2019   Procedure: CATARACT EXTRACTION PHACO AND INTRAOCULAR LENS PLACEMENT (IOC);  Surgeon: [name], MD;  Location: [facility];  Service: Ophthalmology;  Laterality: Left;  CDE: 5.55  . CHOLECYSTECTOMY  1985  . COLONOSCOPY    . HYSTERECTOMY ABDOMINAL WITH SALPINGECTOMY Bilateral 2000  . KIDNEY STONE SURGERY Right 2014   with stent placement in OR  . KNEE ARTHROSCOPY Right 08/22/2013   Procedure: ARTHROSCOPY RIGHT KNEE FOR INFECTION LAVAGE AND DRAINAGE;  Surgeon: Thera Flake., MD;  Location: [facility];  Service: Orthopedics;  Laterality: Right;  . KNEE ARTHROSCOPY WITH PATELLA RECONSTRUCTION Right 08/06/2013   Procedure: RIGHT KNEE ARTHROSCOPY WITH MENISCECTOMY MEDIAL, ARTHROSCOPY KNEE WITH DEBRIDEMENT/SHAVING (CHONDROPLASTY) ;  Surgeon: Thera Flake., MD;  Location: [facility];  Service: Orthopedics;  Laterality: Right;  . LUMBAR LAMINECTOMY/DECOMPRESSION MICRODISCECTOMY Right 09/11/2019   Procedure: Right Lumbar Three-Four Microdiscectomy;  Surgeon: Maeola Harman, MD;  Location: [facility] OR;  Service: Neurosurgery;  Laterality: Right;  Right Lumbar Three-Four Microdiscectomy  . NASAL SINUS SURGERY     x4  . OOPHORECTOMY    . SHOULDER SURGERY  2011   right shoulder  . THYROID SURGERY  1994  . TONSILLECTOMY Bilateral 1974    Social History   Social History Narrative  . Not on file    There is no immunization history on file for this patient.   Objective: Vital Signs: BP (!) 146/91 (BP Location: Left Arm, Patient Position: Sitting, Cuff Size: Normal)   Pulse 81   Resp 15   Ht  (1.626 m)   Wt 170 lb 9.6 oz (77.4 kg)   BMI 29.28 kg/m    Physical Exam Vitals and nursing note reviewed.  Constitutional:      Appearance: She is well-developed and well-nourished.  HENT:     Head: Normocephalic and atraumatic.  Eyes:     Extraocular Movements: EOM normal.     Conjunctiva/sclera: Conjunctivae normal.  Cardiovascular:     Pulses: Intact distal pulses.  Pulmonary:     Effort: Pulmonary effort is normal.  Abdominal:     Palpations: Abdomen is soft.  Musculoskeletal:     Cervical back: Normal range of motion.  Skin:    General: Skin is warm and dry.     Capillary Refill: Capillary refill takes less than 2 seconds.  Neurological:     Mental Status: She is  alert and oriented to person, place, and time.  Psychiatric:        Mood and Affect: Mood and affect normal.        Behavior: Behavior normal.      Musculoskeletal Exam: Generalized hyperalgesia and positive tender points on exam.  C-spine has limited range of motion with lateral rotation.  Trapezius muscle tension and muscle tenderness bilaterally.  Shoulder joints, elbow joints, wrist joints, MCPs, PIPs, DIPs have good range of motion with no synovitis.  She has tenderness on the volar aspect of both wrist joints.  Positive Tinel's and Phalen's sign bilaterally.  Hip joints have good range of motion with no discomfort.  Knee joints have good range of motion with no warmth or effusion.  She has tenderness and warmth of the right ankle joint on examination today.  Hammertoes noted.  She has tenderness over the left fourth MTP joint.  CDAI Exam: CDAI Score: 2.8  Patient Global: 4 mm; Provider Global: 4 mm Swollen: 1 ; Tender: 4  Joint Exam  02/04/2020      Right  Left  Wrist   Tender   Tender  Ankle  Swollen Tender     MTP 4      Tender     Investigation: No additional findings.  Imaging: No results found.  Recent Labs: Lab Results  Component Value Date   WBC 9.6 10/31/2019   HGB 13.8 10/31/2019   PLT 310 10/31/2019   NA 141 10/31/2019   K 4.0 10/31/2019   CL 108 10/31/2019   CO2 26 10/31/2019   GLUCOSE 81 10/31/2019   BUN 24 10/31/2019   CREATININE 0.82 10/31/2019   BILITOT 0.2 10/31/2019   ALKPHOS 59 11/04/2016   AST 21 01/01/2020   ALT 24 01/01/2020   PROT 6.6 10/31/2019   ALBUMIN 4.0 11/04/2016   CALCIUM 9.9 10/31/2019   GFRAA 89 10/31/2019    Speciality Comments: PLQ Eye Exam: 07/31/2019 @ [facility]   Procedures:  No procedures performed Allergies: Sulfa antibiotics, Codeine, Erythromycin, Metronidazole, and Morphine and related   Assessment / Plan:     Visit Diagnoses: Rheumatoid arthritis of multiple sites with negative rheumatoid factor [facility]): She presents today with increased pain and stiffness in both hands and both wrist joints.  She has tenderness and mild inflammation on the volar aspect of both wrists.  She has been experiencing intermittent paresthesias in both hands which has progressively been worsening over the past 1 month.  Referral for NCV with EMG will be placed today.  She is also been experiencing increased pain and intermittent swelling in her right ankle joint.  She has painful range of motion with tenderness and warmth on examination today.  She is currently taking Plaquenil 200 mg 1 tablet by mouth twice daily Monday through Friday.  She is tolerating Plaquenil without any side effects and has not missed any doses recently.  She has been using Voltaren gel topically as needed for pain relief.  A prednisone taper starting at 20 mg tapering by 5 mg every 4 days will be sent to the pharmacy.  She will continue on the current treatment regimen.  If she continues to have  recurrent flares we will discussed adding on methotrexate or Arava to her current treatment regimen in the future.  She is in agreement with the plan.  She will follow-up in the office in 2 months.   High risk medication use: Plaquenil 200 mg 1 tablet by mouth twice daily  Monday through Friday.  CBC and CMP were updated on 10/31/2019.  LFTs were within normal limits on 01/01/2020.  She will continue to require lab monitoring every 5 months.PLQ Eye Exam: 07/31/2019 @ [facility] .  She has not had any recent infections.  Elevated LFTs: LFTs within normal limits on 01/01/2020.  Paresthesia of both hands -She has been experiencing progressively worsening paresthesias in both hands over the past 1 month.  She has not been performing any overuse activities recently.  She has tried wearing carpal tunnel night splints at night but has not noticed any improvement in her symptoms.  Positive Tinel's and Phalen's sign on examination today.  She has tenderness on the volar aspect of both wrists.  A referral for NCV with EMG will be placed today for further evaluation.  She was encouraged to continue to wear her carpal tunnel night splints as well as applying Voltaren gel topically as needed for pain relief plan: Ambulatory referral to Physical Medicine Rehab  Primary osteoarthritis of both hands: She has PIP and DIP thickening consistent with osteoarthritis of both hands.  She is able to make a complete fist bilaterally.  She has been experiencing increased pain and stiffness in both hands.  The importance of joint protection and muscle strengthening were discussed.  She can continue to use Voltaren gel topically as needed for pain relief.  Primary osteoarthritis of right knee: She has good range of motion of the right knee joint on exam.  No warmth or effusion was noted.  Fibromyalgia: She has generalized hyperalgesia and positive tender points on exam.  She has been having more frequent and severe  fibromyalgia flares over the past month.  She continues to have chronic fatigue secondary to insomnia.  We discussed the importance of regular exercise and good sleep hygiene.  Trochanteric bursitis of right hip: She has good range of motion of the right hip joint on exam with no discomfort.  Other fatigue: Chronic and secondary to insomnia.  Chronic SI joint pain: She experiences occasional SI joint pain.  DDD (degenerative disc disease), lumbar: She is not experiencing any increased discomfort in her lower back at this time.  She has no symptoms of radiculopathy.  Other medical conditions are listed as follows:  History of gastroesophageal reflux (GERD)  History of positive PPD  History of renal calculi  History of tics  History of sleep apnea  History of PCOS  History of migraine  History of thyroid nodule  History of depression  History of diabetes mellitus - Diet controlled.     Orders: Orders Placed This Encounter  Procedures  . Ambulatory referral to Physical Medicine Rehab   Meds ordered this encounter  Medications  . predniSONE (DELTASONE) 5 MG tablet    Sig: Take 4 tablets by mouth daily x4 days, 3 tablets by mouth daily x4 days, 2 tablets by mouth daily x4 days, 1 tablet by mouth daily x4 days.    Dispense:  40 tablet    Refill:  0     Follow-Up Instructions: Return in about 2 months (around 04/06/2020) for Rheumatoid arthritis, Osteoarthritis, Fibromyalgia.   Gearldine Bienenstock, PA-C  Note - This record has been created using Dragon software.  Chart creation errors have been sought, but may not always  have been located. Such creation errors do not reflect on  the standard of medical care.

## 2020-02-05 ENCOUNTER — Telehealth: Payer: Self-pay | Admitting: Physical Medicine and Rehabilitation

## 2020-02-05 NOTE — Telephone Encounter (Signed)
Called pt and sch 03/07/20. 

## 2020-02-05 NOTE — Telephone Encounter (Signed)
Patient called asked for a call back concerning her NCS. The number to contact patient is [phone]

## 2020-02-11 ENCOUNTER — Other Ambulatory Visit: Payer: Self-pay | Admitting: Rheumatology

## 2020-02-11 DIAGNOSIS — M0609 Rheumatoid arthritis without rheumatoid factor, multiple sites: Secondary | ICD-10-CM

## 2020-02-12 NOTE — Telephone Encounter (Signed)
Last Visit: 02/04/2020 Next Visit: 04/02/2020 Labs: 10/31/2019 ALT is borderline elevated-31. AST WNL. Rest of CMP WNL. CBC WNL.  01/01/2020 LFTs WNL. Eye exam: 07/31/2019   Current Dose per office note 02/04/2020: Plaquenil 200 mg 1 tablet by mouth twice daily Monday through Friday DX: Rheumatoid arthritis of multiple sites with negative rheumatoid factor   Okay to refill Plaquenil?

## 2020-02-13 ENCOUNTER — Other Ambulatory Visit: Payer: Self-pay | Admitting: Physician Assistant

## 2020-02-13 NOTE — Telephone Encounter (Signed)
Last Visit: 02/04/2020 Next Visit: 04/02/2020  Last Fill: 01/14/2020  Okay to refill Baclofen?

## 2020-02-14 NOTE — Telephone Encounter (Signed)
Last Visit:02/04/2020 Next Visit:04/02/2020  Last Fill: 01/14/2020  Okay to refill Tizanidine?

## 2020-02-25 DIAGNOSIS — G43909 Migraine, unspecified, not intractable, without status migrainosus: Secondary | ICD-10-CM | POA: Diagnosis not present

## 2020-02-25 DIAGNOSIS — Z299 Encounter for prophylactic measures, unspecified: Secondary | ICD-10-CM | POA: Diagnosis not present

## 2020-02-25 DIAGNOSIS — I10 Essential (primary) hypertension: Secondary | ICD-10-CM | POA: Diagnosis not present

## 2020-02-25 DIAGNOSIS — E1165 Type 2 diabetes mellitus with hyperglycemia: Secondary | ICD-10-CM | POA: Diagnosis not present

## 2020-02-25 DIAGNOSIS — Z789 Other specified health status: Secondary | ICD-10-CM | POA: Diagnosis not present

## 2020-02-27 DIAGNOSIS — G43919 Migraine, unspecified, intractable, without status migrainosus: Secondary | ICD-10-CM | POA: Diagnosis not present

## 2020-02-27 DIAGNOSIS — M549 Dorsalgia, unspecified: Secondary | ICD-10-CM | POA: Diagnosis not present

## 2020-02-27 DIAGNOSIS — G4733 Obstructive sleep apnea (adult) (pediatric): Secondary | ICD-10-CM | POA: Diagnosis not present

## 2020-03-05 DIAGNOSIS — Z299 Encounter for prophylactic measures, unspecified: Secondary | ICD-10-CM | POA: Diagnosis not present

## 2020-03-05 DIAGNOSIS — Z6829 Body mass index (BMI) 29.0-29.9, adult: Secondary | ICD-10-CM | POA: Diagnosis not present

## 2020-03-05 DIAGNOSIS — I10 Essential (primary) hypertension: Secondary | ICD-10-CM | POA: Diagnosis not present

## 2020-03-05 DIAGNOSIS — J019 Acute sinusitis, unspecified: Secondary | ICD-10-CM | POA: Diagnosis not present

## 2020-03-05 DIAGNOSIS — E1165 Type 2 diabetes mellitus with hyperglycemia: Secondary | ICD-10-CM | POA: Diagnosis not present

## 2020-03-07 ENCOUNTER — Ambulatory Visit (INDEPENDENT_AMBULATORY_CARE_PROVIDER_SITE_OTHER): Payer: Medicare Other | Admitting: Physical Medicine and Rehabilitation

## 2020-03-07 ENCOUNTER — Encounter: Payer: Self-pay | Admitting: Physical Medicine and Rehabilitation

## 2020-03-07 ENCOUNTER — Other Ambulatory Visit: Payer: Self-pay

## 2020-03-07 DIAGNOSIS — R202 Paresthesia of skin: Secondary | ICD-10-CM | POA: Diagnosis not present

## 2020-03-07 NOTE — Progress Notes (Signed)
Bilateral wrist pain. Numbness in both hands at night. Splints at night were not helping with right hand. Oral steroids helped, but still has numbness in right hand.  Right hand dominant No lotion per patient Numeric Pain Rating Scale and Functional Assessment Average Pain 8   In the last MONTH (on 0-10 scale) has pain interfered with the following?  1. General activity like being  able to carry out your everyday physical activities such as walking, climbing stairs, carrying groceries, or moving a chair?  Rating(4)

## 2020-03-11 NOTE — Progress Notes (Signed)
Christina Lewis - 65 y.o. female MRN [id number]  Date of birth: Feb 17, 1956  Office Visit Note: Visit Date: 03/07/2020 PCP: Kirstie Peri, MD Referred by: Kirstie Peri, MD  Subjective: Chief Complaint  Patient presents with   Right Hand - Numbness   Left Hand - Numbness   HPI:  Christina Lewis is a 65 y.o. female who comes in today at the request of Sherron Ales, PA-C for electrodiagnostic study of the Bilateral upper extremities.  Patient is Right hand dominant. She complains of chronic worsening severe 8 out of 10 pain with numbness and tingling into both hands globally in a nondermatomal fashion. She reports that splints at night were not helping with the right hand but it helped to some degree with the left hand. She has taken a course of oral steroids with some relief. No prior electrodiagnostic studies. Does have a history of diabetes, depression and interstitial cystitis and fibromyalgia and migraines.   ROS Otherwise per HPI.  Assessment & Plan: Visit Diagnoses:    ICD-10-CM   1. 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   There is no significant electrodiagnostic evidence of any other focal nerve entrapment, brachial plexopathy, cervical radiculopathy or generalized peripheral neuropathy.   Recommendations: 1.  Follow-up with referring physician. 2.  Continue current management of symptoms. 3.  Continue use of resting splint at night-time and as needed during the day.  Meds & Orders: No orders of the defined types were placed in this encounter.   Orders Placed This Encounter  Procedures   NCV with EMG (electromyography)    Follow-up: Return for Sherron Ales, PA-C.   Procedures: No procedures performed  EMG & NCV Findings: Evaluation of the left  median (across palm) sensory nerve showed prolonged distal peak latency (Wrist, 3.8 ms).  The right median (across palm) sensory nerve showed prolonged distal peak latency (Wrist, 4.2 ms) and prolonged distal peak latency (Palm, 2.1 ms).  The left ulnar sensory nerve showed prolonged distal peak latency (3.9 ms) and decreased conduction velocity (Wrist-5th Digit, 36 m/s).  All remaining nerves (as indicated in the following tables) were within normal limits.  All left vs. right side differences were within normal limits.    All examined muscles (as indicated in the following table) showed no evidence of electrical instability.    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   There is no significant electrodiagnostic evidence of any other focal nerve entrapment, brachial plexopathy, cervical radiculopathy or generalized peripheral neuropathy.   Recommendations: 1.  Follow-up with referring physician. 2.  Continue current management of symptoms. 3.  Continue use of resting splint at night-time and as needed during the day.  ___________________________ Elease Hashimoto Board Certified, American Board of Physical Medicine and Rehabilitation    Nerve Conduction Studies Anti Sensory Summary Table   Stim Site NR Peak (ms) Norm Peak (ms) P-T Amp (V) Norm P-T Amp Site1 Site2 Delta-P (ms) Dist (cm) Vel (m/s) Norm Vel (m/s)  Left Median Acr Palm Anti Sensory (2nd Digit)  Wrist    *3.8 <3.6 22.8 >10 Wrist Palm 1.8 0.0    Palm    2.0 <2.0 52.7  Right Median Acr Palm Anti Sensory (2nd Digit)  Wrist    *4.2 <3.6 10.1 >10 Wrist Palm 2.1 0.0    Palm    *2.1 <2.0 22.5         Left Radial Anti Sensory (Base 1st Digit)  Wrist    2.3 <3.1 33.8  Wrist Base 1st Digit 2.3 0.0    Right Radial Anti Sensory (Base 1st Digit)  Wrist    2.4 <3.1 25.3  Wrist  Base 1st Digit 2.4 0.0    Left Ulnar Anti Sensory (5th Digit)  Wrist    *3.9 <3.7 37.9 >15.0 Wrist 5th Digit 3.9 14.0 *36 >38  Right Ulnar Anti Sensory (5th Digit)  Wrist    3.6 <3.7 33.1 >15.0 Wrist 5th Digit 3.6 14.0 39 >38   Motor Summary Table   Stim Site NR Onset (ms) Norm Onset (ms) O-P Amp (mV) Norm O-P Amp Site1 Site2 Delta-0 (ms) Dist (cm) Vel (m/s) Norm Vel (m/s)  Left Median Motor (Abd Poll Brev)  Wrist    3.8 <4.2 6.6 >5 Elbow Wrist 3.9 20.0 51 >50  Elbow    7.7  7.0         Right Median Motor (Abd Poll Brev)  Wrist    3.9 <4.2 7.0 >5 Elbow Wrist 3.8 19.0 50 >50  Elbow    7.7  6.8         Left Ulnar Motor (Abd Dig Min)  Wrist    3.2 <4.2 7.0 >3 B Elbow Wrist 3.3 18.0 55 >53  B Elbow    6.5  6.4  A Elbow B Elbow 1.6 10.0 63 >53  A Elbow    8.1  4.8         Right Ulnar Motor (Abd Dig Min)  Wrist    2.7 <4.2 9.0 >3 B Elbow Wrist 2.9 17.5 60 >53  B Elbow    5.6  8.6  A Elbow B Elbow 1.6 9.0 56 >53  A Elbow    7.2  8.7          EMG   Side Muscle Nerve Root Ins Act Fibs Psw Amp Dur Poly Recrt Int Dennie Bible Comment  Right Abd Poll Brev Median C8-T1 Nml Nml Nml Nml Nml 0 Nml Nml   Right 1stDorInt Ulnar C8-T1 Nml Nml Nml Nml Nml 0 Nml Nml   Right PronatorTeres Median C6-7 Nml Nml Nml Nml Nml 0 Nml Nml   Right Biceps Musculocut C5-6 Nml Nml Nml Nml Nml 0 Nml Nml   Right Deltoid Axillary C5-6 Nml Nml Nml Nml Nml 0 Nml Nml     Nerve Conduction Studies Anti Sensory Left/Right Comparison   Stim Site L Lat (ms) R Lat (ms) L-R Lat (ms) L Amp (V) R Amp (V) L-R Amp (%) Site1 Site2 L Vel (m/s) R Vel (m/s) L-R Vel (m/s)  Median Acr Palm Anti Sensory (2nd Digit)  Wrist *3.8 *4.2 0.4 22.8 10.1 55.7 Wrist Palm     Palm 2.0 *2.1 0.1 52.7 22.5 57.3       Radial Anti Sensory (Base 1st Digit)  Wrist 2.3 2.4 0.1 33.8 25.3 25.1 Wrist Base 1st Digit     Ulnar Anti Sensory (5th Digit)  Wrist *3.9 3.6 0.3 37.9 33.1 12.7 Wrist 5th Digit *36 39 3   Motor Left/Right Comparison   Stim Site L Lat  (ms) R Lat (ms) L-R Lat (ms) L Amp (mV) R Amp (mV) L-R Amp (%) Site1 Site2 L Vel (m/s) R  Vel (m/s) L-R Vel (m/s)  Median Motor (Abd Poll Brev)  Wrist 3.8 3.9 0.1 6.6 7.0 5.7 Elbow Wrist 51 50 1  Elbow 7.7 7.7 0.0 7.0 6.8 2.9       Ulnar Motor (Abd Dig Min)  Wrist 3.2 2.7 0.5 7.0 9.0 22.2 B Elbow Wrist 55 60 5  B Elbow 6.5 5.6 0.9 6.4 8.6 25.6 A Elbow B Elbow 63 56 7  A Elbow 8.1 7.2 0.9 4.8 8.7 44.8          Waveforms:                      Clinical History: No specialty comments available.     Objective:  VS:  HT:     WT:    BMI:      BP:    HR: bpm   TEMP: ( )   RESP:  Physical Exam Vitals and nursing note reviewed.  Musculoskeletal:        General: No swelling, tenderness or deformity.     Comments: Inspection reveals no atrophy of the bilateral APB or FDI or hand intrinsics. There is no swelling, color changes, allodynia or dystrophic changes. There is 5 out of 5 strength in the bilateral wrist extension, finger abduction and long finger flexion. There is intact sensation to light touch in all dermatomal and peripheral nerve distributions. There is a negative Froment's test bilaterally. There is a negative Tinel's test at the bilateral wrist and elbow. There is a negative Phalen's test bilaterally. There is a negative Hoffmann's test bilaterally.  Skin:    General: Skin is warm and dry.     Findings: No erythema or rash.  Neurological:     General: No focal deficit present.     Mental Status: She is alert and oriented to person, place, and time.     Motor: No weakness or abnormal muscle tone.     Coordination: Coordination normal.  Psychiatric:        Mood and Affect: Mood normal.        Behavior: Behavior normal.      Imaging: No results found.

## 2020-03-11 NOTE — Procedures (Unsigned)
EMG & NCV Findings: Evaluation of the left median (across palm) sensory nerve showed prolonged distal peak latency (Wrist, 3.8 ms).  The right median (across palm) sensory nerve showed prolonged distal peak latency (Wrist, 4.2 ms) and prolonged distal peak latency (Palm, 2.1 ms).  The left ulnar sensory nerve showed prolonged distal peak latency (3.9 ms) and decreased conduction velocity (Wrist-5th Digit, 36 m/s).  All remaining nerves (as indicated in the following tables) were within normal limits.  All left vs. right side differences were within normal limits.    All examined muscles (as indicated in the following table) showed no evidence of electrical instability.    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   There is no significant electrodiagnostic evidence of any other focal nerve entrapment, brachial plexopathy, cervical radiculopathy or generalized peripheral neuropathy.   Recommendations: 1.  Follow-up with referring physician. 2.  Continue current management of symptoms. 3.  Continue use of resting splint at night-time and as needed during the day.  ___________________________ Elease Hashimoto Board Certified, American Board of Physical Medicine and Rehabilitation    Nerve Conduction Studies Anti Sensory Summary Table   Stim Site NR Peak (ms) Norm Peak (ms) P-T Amp (V) Norm P-T Amp Site1 Site2 Delta-P (ms) Dist (cm) Vel (m/s) Norm Vel (m/s)  Left Median Acr Palm Anti Sensory (2nd Digit)  Wrist    *3.8 <3.6 22.8 >10 Wrist Palm 1.8 0.0    Palm    2.0 <2.0 52.7         Right Median Acr Palm Anti Sensory (2nd Digit)  Wrist    *4.2 <3.6 10.1 >10 Wrist Palm 2.1 0.0    Palm    *2.1 <2.0 22.5         Left Radial Anti Sensory (Base 1st Digit)  Wrist    2.3 <3.1 33.8  Wrist Base 1st Digit 2.3 0.0    Right Radial Anti Sensory (Base  1st Digit)  Wrist    2.4 <3.1 25.3  Wrist Base 1st Digit 2.4 0.0    Left Ulnar Anti Sensory (5th Digit)  Wrist    *3.9 <3.7 37.9 >15.0 Wrist 5th Digit 3.9 14.0 *36 >38  Right Ulnar Anti Sensory (5th Digit)  Wrist    3.6 <3.7 33.1 >15.0 Wrist 5th Digit 3.6 14.0 39 >38   Motor Summary Table   Stim Site NR Onset (ms) Norm Onset (ms) O-P Amp (mV) Norm O-P Amp Site1 Site2 Delta-0 (ms) Dist (cm) Vel (m/s) Norm Vel (m/s)  Left Median Motor (Abd Poll Brev)  Wrist    3.8 <4.2 6.6 >5 Elbow Wrist 3.9 20.0 51 >50  Elbow    7.7  7.0         Right Median Motor (Abd Poll Brev)  Wrist    3.9 <4.2 7.0 >5 Elbow Wrist 3.8 19.0 50 >50  Elbow    7.7  6.8         Left Ulnar Motor (Abd Dig Min)  Wrist    3.2 <4.2 7.0 >3 B Elbow Wrist 3.3 18.0 55 >53  B Elbow    6.5  6.4  A Elbow B Elbow 1.6 10.0 63 >53  A Elbow    8.1  4.8         Right Ulnar Motor (Abd Dig Min)  Wrist    2.7 <4.2 9.0 >3 B Elbow Wrist 2.9  17.5 60 >53  B Elbow    5.6  8.6  A Elbow B Elbow 1.6 9.0 56 >53  A Elbow    7.2  8.7          EMG   Side Muscle Nerve Root Ins Act Fibs Psw Amp Dur Poly Recrt Int Dennie Bible Comment  Right Abd Poll Brev Median C8-T1 Nml Nml Nml Nml Nml 0 Nml Nml   Right 1stDorInt Ulnar C8-T1 Nml Nml Nml Nml Nml 0 Nml Nml   Right PronatorTeres Median C6-7 Nml Nml Nml Nml Nml 0 Nml Nml   Right Biceps Musculocut C5-6 Nml Nml Nml Nml Nml 0 Nml Nml   Right Deltoid Axillary C5-6 Nml Nml Nml Nml Nml 0 Nml Nml     Nerve Conduction Studies Anti Sensory Left/Right Comparison   Stim Site L Lat (ms) R Lat (ms) L-R Lat (ms) L Amp (V) R Amp (V) L-R Amp (%) Site1 Site2 L Vel (m/s) R Vel (m/s) L-R Vel (m/s)  Median Acr Palm Anti Sensory (2nd Digit)  Wrist *3.8 *4.2 0.4 22.8 10.1 55.7 Wrist Palm     Palm 2.0 *2.1 0.1 52.7 22.5 57.3       Radial Anti Sensory (Base 1st Digit)  Wrist 2.3 2.4 0.1 33.8 25.3 25.1 Wrist Base 1st Digit     Ulnar Anti Sensory (5th Digit)  Wrist *3.9 3.6 0.3 37.9 33.1 12.7 Wrist 5th Digit *36 39 3   Motor  Left/Right Comparison   Stim Site L Lat (ms) R Lat (ms) L-R Lat (ms) L Amp (mV) R Amp (mV) L-R Amp (%) Site1 Site2 L Vel (m/s) R Vel (m/s) L-R Vel (m/s)  Median Motor (Abd Poll Brev)  Wrist 3.8 3.9 0.1 6.6 7.0 5.7 Elbow Wrist 51 50 1  Elbow 7.7 7.7 0.0 7.0 6.8 2.9       Ulnar Motor (Abd Dig Min)  Wrist 3.2 2.7 0.5 7.0 9.0 22.2 B Elbow Wrist 55 60 5  B Elbow 6.5 5.6 0.9 6.4 8.6 25.6 A Elbow B Elbow 63 56 7  A Elbow 8.1 7.2 0.9 4.8 8.7 44.8          Waveforms:

## 2020-03-14 ENCOUNTER — Other Ambulatory Visit: Payer: Self-pay | Admitting: Physician Assistant

## 2020-03-14 ENCOUNTER — Other Ambulatory Visit: Payer: Self-pay | Admitting: Rheumatology

## 2020-03-14 NOTE — Telephone Encounter (Signed)
Last Visit: 02/04/2020 Next Visit: 04/02/2020  Current Dose per office note on 02/04/2020, not documented in last visit Dx: Rheumatoid arthritis of multiple sites with negative rheumatoid factor   Okay to refill Zanaflex?

## 2020-03-14 NOTE — Telephone Encounter (Signed)
Last Visit: 02/04/2020 Next Visit: 04/02/2020  Current Dose per office note on 02/04/2020, not documented in last visit Dx: Rheumatoid arthritis of multiple sites with negative rheumatoid factor     Okay to refill Baclofen?

## 2020-03-19 NOTE — Progress Notes (Deleted)
Office Visit Note  Patient: Christina Lewis             Date of Birth: [birth year]           MRN: [id number]             PCP: Kirstie Peri, MD Referring: Kirstie Peri, MD Visit Date: 04/02/2020 Occupation: @  Subjective:  No chief complaint on file.   History of Present Illness: Christina Lewis is a 65 y.o. female ***   Activities of Daily Living:  Patient reports morning stiffness for *** {minute/hour:19697}.   Patient {ACTIONS;DENIES/REPORTS:21021675::"Denies"} nocturnal pain.  Difficulty dressing/grooming: {ACTIONS;DENIES/REPORTS:21021675::"Denies"} Difficulty climbing stairs: {ACTIONS;DENIES/REPORTS:21021675::"Denies"} Difficulty getting out of chair: {ACTIONS;DENIES/REPORTS:21021675::"Denies"} Difficulty using hands for taps, buttons, cutlery, and/or writing: {ACTIONS;DENIES/REPORTS:21021675::"Denies"}  No Rheumatology ROS completed.   PMFS History:  Patient Active Problem List   Diagnosis Date Noted  . 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 myelopathy 09/11/2019  . Leukocytosis 11/04/2016  . Fibromyalgia 02/10/2016  . Other fatigue 02/10/2016  . Primary osteoarthritis of both hands 02/10/2016  . History of migraine 02/10/2016  . History of thyroid nodule 02/10/2016  . History of sleep apnea 02/10/2016  . History of renal calculi 02/10/2016  . Pain in joint, shoulder region 10/02/2010  . Adhesive capsulitis of shoulder 10/02/2010  . Muscle weakness (generalized) 10/02/2010    Past Medical History:  Diagnosis Date  . Arthritis   . Asthma 2007  . Depression   . Diabetes mellitus   . Fibromyalgia   . GERD (gastroesophageal reflux disease)   . Gout   . Hematuria   . History of kidney stones   . History of tics   . Interstitial cystitis 2016  . Migraines   . Osteoarthritis   . PCOS (polycystic ovarian syndrome) 1993  . Pneumonia 2019  . PONV (postoperative nausea and vomiting)   . Sleep apnea   . 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 Thyroid nodule     Family History  Problem Relation Age of Onset  . Fibromyalgia Mother   . Psoriasis Mother        psoriatic arthritis   . Diabetes Mother   . Heart disease Mother   . Psoriasis Sister        psoriatic arthritis   . Diabetes Sister   . Rheum arthritis Sister   . Fibromyalgia Sister   . Diabetes Sister    Past Surgical History:  Procedure Laterality Date  . ABDOMINAL HYSTERECTOMY  2000  . APPENDECTOMY  1976  . BREAST LUMPECTOMY  1989   lt-negative  . CARDIOVASCULAR STRESS TEST  2000  . CATARACT EXTRACTION W/PHACO Left 12/07/2019   Procedure: CATARACT EXTRACTION PHACO AND INTRAOCULAR LENS PLACEMENT (IOC);  Surgeon: [name], MD;  Location: [facility];  Service: Ophthalmology;  Laterality: Left;  CDE: 5.55  . CHOLECYSTECTOMY  1985  . COLONOSCOPY    . HYSTERECTOMY ABDOMINAL WITH SALPINGECTOMY Bilateral 2000  . KIDNEY STONE SURGERY Right 2014   with stent placement in OR  . KNEE ARTHROSCOPY Right 08/22/2013   Procedure: ARTHROSCOPY RIGHT KNEE FOR INFECTION LAVAGE AND DRAINAGE;  Surgeon: Thera Flake., MD;  Location: [facility];  Service: Orthopedics;  Laterality: Right;  . KNEE ARTHROSCOPY WITH PATELLA RECONSTRUCTION Right 08/06/2013   Procedure: RIGHT KNEE ARTHROSCOPY WITH MENISCECTOMY MEDIAL, ARTHROSCOPY KNEE WITH DEBRIDEMENT/SHAVING (CHONDROPLASTY) ;  Surgeon: Thera Flake., MD;  Location: [facility];  Service: Orthopedics;  Laterality: Right;  . LUMBAR LAMINECTOMY/DECOMPRESSION MICRODISCECTOMY Right 09/11/2019   Procedure: Right Lumbar Three-Four Microdiscectomy;  Surgeon: Maeola Harman, MD;  Location: [facility];  Service: Neurosurgery;  Laterality: Right;  Right Lumbar Three-Four Microdiscectomy  . NASAL SINUS SURGERY     x4  . OOPHORECTOMY    . SHOULDER SURGERY  2011   right shoulder  . THYROID SURGERY  1994  . TONSILLECTOMY Bilateral 1974   Social History   Social History Narrative  . Not on  file    There is no immunization history on file for this patient.   Objective: Vital Signs: There were no vitals taken for this visit.   Physical Exam   Musculoskeletal Exam: ***  CDAI Exam: CDAI Score: -- Patient Global: --; Provider Global: -- Swollen: --; Tender: -- Joint Exam 04/02/2020   No joint exam has been documented for this visit   There is currently no information documented on the homunculus. Go to the Rheumatology activity and complete the homunculus joint exam.  Investigation: No additional findings.  Imaging: NCV with EMG (electromyography)  Result Date: 03/07/2020 Tyrell Antonio, MD     03/12/2020  8:11 AM EMG & NCV Findings: Evaluation of the left median (across palm) sensory nerve showed prolonged distal peak latency (Wrist, 3.8 ms).  The right median (across palm) sensory nerve showed prolonged distal peak latency (Wrist, 4.2 ms) and prolonged distal peak latency (Palm, 2.1 ms).  The left ulnar sensory nerve showed prolonged distal peak latency (3.9 ms) and decreased conduction velocity (Wrist-5th Digit, 36 m/s).  All remaining nerves (as indicated in the following tables) were within normal limits.  All left vs. right side differences were within normal limits.  All examined muscles (as indicated in the following table) showed no evidence of electrical instability.  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 There is no significant electrodiagnostic evidence of any other focal nerve entrapment, brachial plexopathy, cervical radiculopathy or generalized peripheral neuropathy. Recommendations: 1.  Follow-up with referring physician. 2.  Continue current management of symptoms. 3.  Continue use of resting splint at night-time and as needed during the day. ___________________________ Elease Hashimoto Board Certified,  American Board of Physical Medicine and Rehabilitation Nerve Conduction Studies Anti Sensory Summary Table  Stim Site NR Peak (ms) Norm Peak (ms) P-T Amp (V) Norm P-T Amp Site1 Site2 Delta-P (ms) Dist (cm) Vel (m/s) Norm Vel (m/s) Left Median Acr Palm Anti Sensory (2nd Digit) Wrist    *3.8 <3.6 22.8 >10 Wrist Palm 1.8 0.0   Palm    2.0 <2.0 52.7        Right Median Acr Palm Anti Sensory (2nd Digit) Wrist    *4.2 <3.6 10.1 >10 Wrist Palm 2.1 0.0   Palm    *2.1 <2.0 22.5        Left Radial Anti Sensory (Base 1st Digit) Wrist    2.3 <3.1 33.8  Wrist Base 1st Digit 2.3 0.0   Right Radial Anti Sensory (Base 1st Digit) Wrist    2.4 <3.1 25.3  Wrist Base 1st Digit 2.4 0.0   Left Ulnar Anti Sensory (5th Digit) Wrist    *3.9 <3.7 37.9 >15.0 Wrist 5th Digit 3.9 14.0 *36 >38 Right Ulnar Anti Sensory (5th Digit) Wrist    3.6 <3.7 33.1 >15.0 Wrist 5th Digit 3.6 14.0 39 >38 Motor Summary Table  Stim Site NR Onset (ms) Norm  Onset (ms) O-P Amp (mV) Norm O-P Amp Site1 Site2 Delta-0 (ms) Dist (cm) Vel (m/s) Norm Vel (m/s) Left Median Motor (Abd Poll Brev) Wrist    3.8 <4.2 6.6 >5 Elbow Wrist 3.9 20.0 51 >50 Elbow    7.7  7.0        Right Median Motor (Abd Poll Brev) Wrist    3.9 <4.2 7.0 >5 Elbow Wrist 3.8 19.0 50 >50 Elbow    7.7  6.8        Left Ulnar Motor (Abd Dig Min) Wrist    3.2 <4.2 7.0 >3 B Elbow Wrist 3.3 18.0 55 >53 B Elbow    6.5  6.4  A Elbow B Elbow 1.6 10.0 63 >53 A Elbow    8.1  4.8        Right Ulnar Motor (Abd Dig Min) Wrist    2.7 <4.2 9.0 >3 B Elbow Wrist 2.9 17.5 60 >53 B Elbow    5.6  8.6  A Elbow B Elbow 1.6 9.0 56 >53 A Elbow    7.2  8.7        EMG  Side Muscle Nerve Root Ins Act Fibs Psw Amp Dur Poly Recrt Int Dennie Bible Comment Right Abd Poll Brev Median C8-T1 Nml Nml Nml Nml Nml 0 Nml Nml  Right 1stDorInt Ulnar C8-T1 Nml Nml Nml Nml Nml 0 Nml Nml  Right PronatorTeres Median C6-7 Nml Nml Nml Nml Nml 0 Nml Nml  Right Biceps Musculocut C5-6 Nml Nml Nml Nml Nml 0 Nml Nml  Right Deltoid Axillary C5-6 Nml Nml Nml Nml  Nml 0 Nml Nml  Nerve Conduction Studies Anti Sensory Left/Right Comparison  Stim Site L Lat (ms) R Lat (ms) L-R Lat (ms) L Amp (V) R Amp (V) L-R Amp (%) Site1 Site2 L Vel (m/s) R Vel (m/s) L-R Vel (m/s) Median Acr Palm Anti Sensory (2nd Digit) Wrist *3.8 *4.2 0.4 22.8 10.1 55.7 Wrist Palm    Palm 2.0 *2.1 0.1 52.7 22.5 57.3      Radial Anti Sensory (Base 1st Digit) Wrist 2.3 2.4 0.1 33.8 25.3 25.1 Wrist Base 1st Digit    Ulnar Anti Sensory (5th Digit) Wrist *3.9 3.6 0.3 37.9 33.1 12.7 Wrist 5th Digit *36 39 3 Motor Left/Right Comparison  Stim Site L Lat (ms) R Lat (ms) L-R Lat (ms) L Amp (mV) R Amp (mV) L-R Amp (%) Site1 Site2 L Vel (m/s) R Vel (m/s) L-R Vel (m/s) Median Motor (Abd Poll Brev) Wrist 3.8 3.9 0.1 6.6 7.0 5.7 Elbow Wrist 51 50 1 Elbow 7.7 7.7 0.0 7.0 6.8 2.9      Ulnar Motor (Abd Dig Min) Wrist 3.2 2.7 0.5 7.0 9.0 22.2 B Elbow Wrist 55 60 5 B Elbow 6.5 5.6 0.9 6.4 8.6 25.6 A Elbow B Elbow 63 56 7 A Elbow 8.1 7.2 0.9 4.8 8.7 44.8      Waveforms:              Recent Labs: Lab Results  Component Value Date   WBC 9.6 10/31/2019   HGB 13.8 10/31/2019   PLT 310 10/31/2019   NA 141 10/31/2019   K 4.0 10/31/2019   CL 108 10/31/2019   CO2 26 10/31/2019   GLUCOSE 81 10/31/2019   BUN 24 10/31/2019   CREATININE 0.82 10/31/2019   BILITOT 0.2 10/31/2019   ALKPHOS 59 11/04/2016   AST 21 01/01/2020   ALT 24 01/01/2020   PROT 6.6 10/31/2019   ALBUMIN 4.0 11/04/2016  CALCIUM 9.9 10/31/2019   GFRAA 89 10/31/2019    Speciality Comments: PLQ Eye Exam: 07/31/2019 @ [facility]   Procedures:  No procedures performed Allergies: Sulfa antibiotics, Codeine, Erythromycin, Metronidazole, and Morphine and related   Assessment / Plan:     Visit Diagnoses: No diagnosis found.  Orders: No orders of the defined types were placed in this encounter.  No orders of the defined types were placed in this encounter.   Face-to-face time spent with patient was *** minutes. Greater than 50%  of time was spent in counseling and coordination of care.  Follow-Up Instructions: No follow-ups on file.   Ellen Henri, CMA  Note - This record has been created using Animal nutritionist.  Chart creation errors have been sought, but may not always  have been located. Such creation errors do not reflect on  the standard of medical care.

## 2020-03-24 ENCOUNTER — Other Ambulatory Visit (HOSPITAL_COMMUNITY): Payer: Self-pay | Admitting: Internal Medicine

## 2020-03-24 ENCOUNTER — Other Ambulatory Visit: Payer: Self-pay | Admitting: *Deleted

## 2020-03-24 DIAGNOSIS — Z79899 Other long term (current) drug therapy: Secondary | ICD-10-CM

## 2020-03-24 DIAGNOSIS — Z1231 Encounter for screening mammogram for malignant neoplasm of breast: Secondary | ICD-10-CM

## 2020-03-25 LAB — COMPLETE METABOLIC PANEL WITH GFR
AG Ratio: 2 (calc) (ref 1.0–2.5)
ALT: 20 U/L (ref 6–29)
AST: 17 U/L (ref 10–35)
Albumin: 4.5 g/dL (ref 3.6–5.1)
Alkaline phosphatase (APISO): 65 U/L (ref 37–153)
BUN: 19 mg/dL (ref 7–25)
CO2: 28 mmol/L (ref 20–32)
Calcium: 9.9 mg/dL (ref 8.6–10.4)
Chloride: 105 mmol/L (ref 98–110)
Creat: 0.75 mg/dL (ref 0.50–0.99)
GFR, Est African American: 98 mL/min/{1.73_m2} (ref >=60)
GFR, Est Non African American: 85 mL/min/{1.73_m2} (ref >=60)
Globulin: 2.2 g/dL (calc) (ref 1.9–3.7)
Glucose, Bld: 110 mg/dL — ABNORMAL HIGH (ref 65–99)
Potassium: 4.8 mmol/L (ref 3.5–5.3)
Sodium: 139 mmol/L (ref 135–146)
Total Bilirubin: 0.3 mg/dL (ref 0.2–1.2)
Total Protein: 6.7 g/dL (ref 6.1–8.1)

## 2020-03-25 LAB — CBC WITH DIFFERENTIAL/PLATELET
Absolute Monocytes: 670 cells/uL (ref 200–950)
Basophils Absolute: 124 cells/uL (ref 0–200)
Basophils Relative: 1.2 %
Eosinophils Absolute: 453 cells/uL (ref 15–500)
Eosinophils Relative: 4.4 %
HCT: 38.5 % (ref 35.0–45.0)
Hemoglobin: 13.4 g/dL (ref 11.7–15.5)
Lymphs Abs: 2977 cells/uL (ref 850–3900)
MCH: 31.1 pg (ref 27.0–33.0)
MCHC: 34.8 g/dL (ref 32.0–36.0)
MCV: 89.3 fL (ref 80.0–100.0)
MPV: 10.6 fL (ref 7.5–12.5)
Monocytes Relative: 6.5 %
Neutro Abs: 6077 cells/uL (ref 1500–7800)
Neutrophils Relative %: 59 %
Platelets: 344 10*3/uL (ref 140–400)
RBC: 4.31 10*6/uL (ref 3.80–5.10)
RDW: 12.8 % (ref 11.0–15.0)
Total Lymphocyte: 28.9 %
WBC: 10.3 10*3/uL (ref 3.8–10.8)

## 2020-03-25 NOTE — Progress Notes (Signed)
Glucose is 110.  Rest of CMP WNL.  CBC WNL.

## 2020-04-02 ENCOUNTER — Ambulatory Visit: Payer: BC Managed Care – PPO | Admitting: Rheumatology

## 2020-04-02 DIAGNOSIS — M19042 Primary osteoarthritis, left hand: Secondary | ICD-10-CM

## 2020-04-02 DIAGNOSIS — M7061 Trochanteric bursitis, right hip: Secondary | ICD-10-CM

## 2020-04-02 DIAGNOSIS — G8929 Other chronic pain: Secondary | ICD-10-CM

## 2020-04-02 DIAGNOSIS — Z8742 Personal history of other diseases of the female genital tract: Secondary | ICD-10-CM

## 2020-04-02 DIAGNOSIS — Z8719 Personal history of other diseases of the digestive system: Secondary | ICD-10-CM

## 2020-04-02 DIAGNOSIS — M0609 Rheumatoid arthritis without rheumatoid factor, multiple sites: Secondary | ICD-10-CM

## 2020-04-02 DIAGNOSIS — Z79899 Other long term (current) drug therapy: Secondary | ICD-10-CM

## 2020-04-02 DIAGNOSIS — R7989 Other specified abnormal findings of blood chemistry: Secondary | ICD-10-CM

## 2020-04-02 DIAGNOSIS — Z87442 Personal history of urinary calculi: Secondary | ICD-10-CM

## 2020-04-02 DIAGNOSIS — M797 Fibromyalgia: Secondary | ICD-10-CM

## 2020-04-02 DIAGNOSIS — Z8639 Personal history of other endocrine, nutritional and metabolic disease: Secondary | ICD-10-CM

## 2020-04-02 DIAGNOSIS — R202 Paresthesia of skin: Secondary | ICD-10-CM

## 2020-04-02 DIAGNOSIS — M5136 Other intervertebral disc degeneration, lumbar region: Secondary | ICD-10-CM

## 2020-04-02 DIAGNOSIS — Z8669 Personal history of other diseases of the nervous system and sense organs: Secondary | ICD-10-CM

## 2020-04-02 DIAGNOSIS — Z8659 Personal history of other mental and behavioral disorders: Secondary | ICD-10-CM

## 2020-04-02 DIAGNOSIS — M1711 Unilateral primary osteoarthritis, right knee: Secondary | ICD-10-CM

## 2020-04-02 DIAGNOSIS — Z9289 Personal history of other medical treatment: Secondary | ICD-10-CM

## 2020-04-02 DIAGNOSIS — R5383 Other fatigue: Secondary | ICD-10-CM

## 2020-04-02 NOTE — Progress Notes (Deleted)
Office Visit Note  Patient: Christina Lewis             Date of Birth: 1955/05/10           MRN: [id number]             PCP: Kirstie Peri, MD Referring: Kirstie Peri, MD Visit Date: 04/08/2020 Occupation: @GUAROCC @  Subjective:    History of Present Illness: Christina Lewis is a 65 y.o. female with history of seropositive rheumatoid arthritis, fibromyalgia, and osteoarthritis.  She is taking plaquenil 200 mg 1 tablet by mouth twice daily Monday through Friday.  CBC and CMP updated on 03/24/20.  She will continue to require lab work every 5 months to monitor for drug toxicity.   Activities of Daily Living:  Patient reports morning stiffness for *** {minute/hour:19697}.   Patient {ACTIONS;DENIES/REPORTS:21021675::"Denies"} nocturnal pain.  Difficulty dressing/grooming: {ACTIONS;DENIES/REPORTS:21021675::"Denies"} Difficulty climbing stairs: {ACTIONS;DENIES/REPORTS:21021675::"Denies"} Difficulty getting out of chair: {ACTIONS;DENIES/REPORTS:21021675::"Denies"} Difficulty using hands for taps, buttons, cutlery, and/or writing: {ACTIONS;DENIES/REPORTS:21021675::"Denies"}  No Rheumatology ROS completed.   PMFS History:  Patient Active Problem List   Diagnosis Date Noted  . 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 myelopathy 09/11/2019  . Leukocytosis 11/04/2016  . Fibromyalgia 02/10/2016  . Other fatigue 02/10/2016  . Primary osteoarthritis of both hands 02/10/2016  . History of migraine 02/10/2016  . History of thyroid nodule 02/10/2016  . History of sleep apnea 02/10/2016  . History of renal calculi 02/10/2016  . Pain in joint, shoulder region 10/02/2010  . Adhesive capsulitis of shoulder 10/02/2010  . Muscle weakness (generalized) 10/02/2010    Past Medical History:  Diagnosis Date  . Arthritis   . Asthma 2007  . Depression   . Diabetes mellitus   . Fibromyalgia   . GERD (gastroesophageal reflux disease)   . Gout   . Hematuria   . History of kidney stones   . History of tics    . Interstitial cystitis 2016  . Migraines   . Osteoarthritis   . PCOS (polycystic ovarian syndrome) 1993  . Pneumonia 2019  . PONV (postoperative nausea and vomiting)   . Sleep apnea   . 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 Thyroid nodule     Family History  Problem Relation Age of Onset  . Fibromyalgia Mother   . Psoriasis Mother        psoriatic arthritis   . Diabetes Mother   . Heart disease Mother   . Psoriasis Sister        psoriatic arthritis   . Diabetes Sister   . Rheum arthritis Sister   . Fibromyalgia Sister   . Diabetes Sister    Past Surgical History:  Procedure Laterality Date  . ABDOMINAL HYSTERECTOMY  2000  . APPENDECTOMY  1976  . BREAST LUMPECTOMY  1989   lt-negative  . CARDIOVASCULAR STRESS TEST  2000  . CATARACT EXTRACTION W/PHACO Left 12/07/2019   Procedure: CATARACT EXTRACTION PHACO AND INTRAOCULAR LENS PLACEMENT (IOC);  Surgeon: [name], MD;  Location: [facility];  Service: Ophthalmology;  Laterality: Left;  CDE: 5.55  . CHOLECYSTECTOMY  1985  . COLONOSCOPY    . HYSTERECTOMY ABDOMINAL WITH SALPINGECTOMY Bilateral 2000  . KIDNEY STONE SURGERY Right 2014   with stent placement in OR  . KNEE ARTHROSCOPY Right 08/22/2013   Procedure: ARTHROSCOPY RIGHT KNEE FOR INFECTION LAVAGE AND DRAINAGE;  Surgeon: Thera Flake., MD;  Location: [facility];  Service: Orthopedics;  Laterality:  Right;  Marland Kitchen KNEE ARTHROSCOPY WITH PATELLA RECONSTRUCTION Right 08/06/2013   Procedure: RIGHT KNEE ARTHROSCOPY WITH MENISCECTOMY MEDIAL, ARTHROSCOPY KNEE WITH DEBRIDEMENT/SHAVING (CHONDROPLASTY) ;  Surgeon: Thera Flake., MD;  Location: [facility];  Service: Orthopedics;  Laterality: Right;  . LUMBAR LAMINECTOMY/DECOMPRESSION MICRODISCECTOMY Right 09/11/2019   Procedure: Right Lumbar Three-Four Microdiscectomy;  Surgeon: Maeola Harman, MD;  Location: [facility];  Service: Neurosurgery;  Laterality: Right;   Right Lumbar Three-Four Microdiscectomy  . NASAL SINUS SURGERY     x4  . OOPHORECTOMY    . SHOULDER SURGERY  2011   right shoulder  . THYROID SURGERY  1994  . TONSILLECTOMY Bilateral 1974   Social History   Social History Narrative  . Not on file    There is no immunization history on file for this patient.   Objective: Vital Signs: There were no vitals taken for this visit.   Physical Exam   Musculoskeletal Exam: ***  CDAI Exam: CDAI Score: - Patient Global: -; Provider Global: - Swollen: -; Tender: - Joint Exam 04/08/2020   No joint exam has been documented for this visit   There is currently no information documented on the homunculus. Go to the Rheumatology activity and complete the homunculus joint exam.  Investigation: No additional findings.  Imaging: NCV with EMG (electromyography)  Result Date: 03/07/2020 Tyrell Antonio, MD     03/12/2020  8:11 AM EMG & NCV Findings: Evaluation of the left median (across palm) sensory nerve showed prolonged distal peak latency (Wrist, 3.8 ms).  The right median (across palm) sensory nerve showed prolonged distal peak latency (Wrist, 4.2 ms) and prolonged distal peak latency (Palm, 2.1 ms).  The left ulnar sensory nerve showed prolonged distal peak latency (3.9 ms) and decreased conduction velocity (Wrist-5th Digit, 36 m/s).  All remaining nerves (as indicated in the following tables) were within normal limits.  All left vs. right side differences were within normal limits.  All examined muscles (as indicated in the following table) showed no evidence of electrical instability.  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 There is no significant electrodiagnostic evidence of any other focal nerve entrapment, brachial plexopathy, cervical radiculopathy or generalized peripheral neuropathy.  Recommendations: 1.  Follow-up with referring physician. 2.  Continue current management of symptoms. 3.  Continue use of resting splint at night-time and as needed during the day. ___________________________ Elease Hashimoto Board Certified, American Board of Physical Medicine and Rehabilitation Nerve Conduction Studies Anti Sensory Summary Table  Stim Site NR Peak (ms) Norm Peak (ms) P-T Amp (V) Norm P-T Amp Site1 Site2 Delta-P (ms) Dist (cm) Vel (m/s) Norm Vel (m/s) Left Median Acr Palm Anti Sensory (2nd Digit) Wrist    *3.8 <3.6 22.8 >10 Wrist Palm 1.8 0.0   Palm    2.0 <2.0 52.7        Right Median Acr Palm Anti Sensory (2nd Digit) Wrist    *4.2 <3.6 10.1 >10 Wrist Palm 2.1 0.0   Palm    *2.1 <2.0 22.5        Left Radial Anti Sensory (Base 1st Digit) Wrist    2.3 <3.1 33.8  Wrist Base 1st Digit 2.3 0.0   Right Radial Anti Sensory (Base 1st Digit) Wrist    2.4 <3.1 25.3  Wrist Base 1st Digit 2.4 0.0   Left Ulnar Anti Sensory (5th Digit) Wrist    *  3.9 <3.7 37.9 >15.0 Wrist 5th Digit 3.9 14.0 *36 >38 Right Ulnar Anti Sensory (5th Digit) Wrist    3.6 <3.7 33.1 >15.0 Wrist 5th Digit 3.6 14.0 39 >38 Motor Summary Table  Stim Site NR Onset (ms) Norm Onset (ms) O-P Amp (mV) Norm O-P Amp Site1 Site2 Delta-0 (ms) Dist (cm) Vel (m/s) Norm Vel (m/s) Left Median Motor (Abd Poll Brev) Wrist    3.8 <4.2 6.6 >5 Elbow Wrist 3.9 20.0 51 >50 Elbow    7.7  7.0        Right Median Motor (Abd Poll Brev) Wrist    3.9 <4.2 7.0 >5 Elbow Wrist 3.8 19.0 50 >50 Elbow    7.7  6.8        Left Ulnar Motor (Abd Dig Min) Wrist    3.2 <4.2 7.0 >3 B Elbow Wrist 3.3 18.0 55 >53 B Elbow    6.5  6.4  A Elbow B Elbow 1.6 10.0 63 >53 A Elbow    8.1  4.8        Right Ulnar Motor (Abd Dig Min) Wrist    2.7 <4.2 9.0 >3 B Elbow Wrist 2.9 17.5 60 >53 B Elbow    5.6  8.6  A Elbow B Elbow 1.6 9.0 56 >53 A Elbow    7.2  8.7        EMG  Side Muscle Nerve Root Ins Act Fibs Psw Amp Dur Poly Recrt Int Dennie Bible Comment Right Abd Poll Brev Median C8-T1 Nml Nml Nml  Nml Nml 0 Nml Nml  Right 1stDorInt Ulnar C8-T1 Nml Nml Nml Nml Nml 0 Nml Nml  Right PronatorTeres Median C6-7 Nml Nml Nml Nml Nml 0 Nml Nml  Right Biceps Musculocut C5-6 Nml Nml Nml Nml Nml 0 Nml Nml  Right Deltoid Axillary C5-6 Nml Nml Nml Nml Nml 0 Nml Nml  Nerve Conduction Studies Anti Sensory Left/Right Comparison  Stim Site L Lat (ms) R Lat (ms) L-R Lat (ms) L Amp (V) R Amp (V) L-R Amp (%) Site1 Site2 L Vel (m/s) R Vel (m/s) L-R Vel (m/s) Median Acr Palm Anti Sensory (2nd Digit) Wrist *3.8 *4.2 0.4 22.8 10.1 55.7 Wrist Palm    Palm 2.0 *2.1 0.1 52.7 22.5 57.3      Radial Anti Sensory (Base 1st Digit) Wrist 2.3 2.4 0.1 33.8 25.3 25.1 Wrist Base 1st Digit    Ulnar Anti Sensory (5th Digit) Wrist *3.9 3.6 0.3 37.9 33.1 12.7 Wrist 5th Digit *36 39 3 Motor Left/Right Comparison  Stim Site L Lat (ms) R Lat (ms) L-R Lat (ms) L Amp (mV) R Amp (mV) L-R Amp (%) Site1 Site2 L Vel (m/s) R Vel (m/s) L-R Vel (m/s) Median Motor (Abd Poll Brev) Wrist 3.8 3.9 0.1 6.6 7.0 5.7 Elbow Wrist 51 50 1 Elbow 7.7 7.7 0.0 7.0 6.8 2.9      Ulnar Motor (Abd Dig Min) Wrist 3.2 2.7 0.5 7.0 9.0 22.2 B Elbow Wrist 55 60 5 B Elbow 6.5 5.6 0.9 6.4 8.6 25.6 A Elbow B Elbow 63 56 7 A Elbow 8.1 7.2 0.9 4.8 8.7 44.8      Waveforms:              Recent Labs: Lab Results  Component Value Date   WBC 10.3 03/24/2020   HGB 13.4 03/24/2020   PLT 344 03/24/2020   NA 139 03/24/2020   K 4.8 03/24/2020   CL 105 03/24/2020   CO2 28 03/24/2020   GLUCOSE 110 (H)  03/24/2020   BUN 19 03/24/2020   CREATININE 0.75 03/24/2020   BILITOT 0.3 03/24/2020   ALKPHOS 59 11/04/2016   AST 17 03/24/2020   ALT 20 03/24/2020   PROT 6.7 03/24/2020   ALBUMIN 4.0 11/04/2016   CALCIUM 9.9 03/24/2020   GFRAA 98 03/24/2020    Speciality Comments: PLQ Eye Exam: 07/31/2019 @ [facility]   Procedures:  No procedures performed Allergies: Sulfa antibiotics, Codeine, Erythromycin, Metronidazole, and Morphine and related   Assessment / Plan:      Visit Diagnoses: No diagnosis found.  Orders: No orders of the defined types were placed in this encounter.  No orders of the defined types were placed in this encounter.   Face-to-face time spent with patient was *** minutes. Greater than 50% of time was spent in counseling and coordination of care.  Follow-Up Instructions: No follow-ups on file.   Ellen Henri, CMA  Note - This record has been created using Animal nutritionist.  Chart creation errors have been sought, but may not always  have been located. Such creation errors do not reflect on  the standard of medical care.

## 2020-04-03 DIAGNOSIS — H04123 Dry eye syndrome of bilateral lacrimal glands: Secondary | ICD-10-CM | POA: Diagnosis not present

## 2020-04-03 DIAGNOSIS — H26492 Other secondary cataract, left eye: Secondary | ICD-10-CM | POA: Diagnosis not present

## 2020-04-03 DIAGNOSIS — Z961 Presence of intraocular lens: Secondary | ICD-10-CM | POA: Diagnosis not present

## 2020-04-08 ENCOUNTER — Ambulatory Visit: Payer: Medicare Other | Admitting: Physician Assistant

## 2020-04-08 DIAGNOSIS — M797 Fibromyalgia: Secondary | ICD-10-CM

## 2020-04-08 DIAGNOSIS — R5383 Other fatigue: Secondary | ICD-10-CM

## 2020-04-08 DIAGNOSIS — R202 Paresthesia of skin: Secondary | ICD-10-CM

## 2020-04-08 DIAGNOSIS — M19041 Primary osteoarthritis, right hand: Secondary | ICD-10-CM

## 2020-04-08 DIAGNOSIS — G8929 Other chronic pain: Secondary | ICD-10-CM

## 2020-04-08 DIAGNOSIS — Z8669 Personal history of other diseases of the nervous system and sense organs: Secondary | ICD-10-CM

## 2020-04-08 DIAGNOSIS — Z87442 Personal history of urinary calculi: Secondary | ICD-10-CM

## 2020-04-08 DIAGNOSIS — Z8719 Personal history of other diseases of the digestive system: Secondary | ICD-10-CM

## 2020-04-08 DIAGNOSIS — Z9289 Personal history of other medical treatment: Secondary | ICD-10-CM

## 2020-04-08 DIAGNOSIS — Z8659 Personal history of other mental and behavioral disorders: Secondary | ICD-10-CM

## 2020-04-08 DIAGNOSIS — R7989 Other specified abnormal findings of blood chemistry: Secondary | ICD-10-CM

## 2020-04-08 DIAGNOSIS — M5136 Other intervertebral disc degeneration, lumbar region: Secondary | ICD-10-CM

## 2020-04-08 DIAGNOSIS — M7061 Trochanteric bursitis, right hip: Secondary | ICD-10-CM

## 2020-04-08 DIAGNOSIS — Z79899 Other long term (current) drug therapy: Secondary | ICD-10-CM

## 2020-04-08 DIAGNOSIS — Z8742 Personal history of other diseases of the female genital tract: Secondary | ICD-10-CM

## 2020-04-08 DIAGNOSIS — Z8639 Personal history of other endocrine, nutritional and metabolic disease: Secondary | ICD-10-CM

## 2020-04-08 DIAGNOSIS — M0609 Rheumatoid arthritis without rheumatoid factor, multiple sites: Secondary | ICD-10-CM

## 2020-04-08 DIAGNOSIS — M1711 Unilateral primary osteoarthritis, right knee: Secondary | ICD-10-CM

## 2020-04-08 NOTE — Progress Notes (Unsigned)
Office Visit Note  Patient: Christina Lewis             Date of Birth: 10/20/1955           MRN: [id number]             PCP: Kirstie Peri, MD Referring: Kirstie Peri, MD Visit Date: 04/09/2020 Occupation: @GUAROCC @  Subjective:  Pain in multiple joints   History of Present Illness: Christina Lewis is a 65 y.o. female with history of seronegative rheumatoid arthritis and osteoarthritis.  Patient is taking Plaquenil 200 mg 1 tablet by mouth twice daily Monday through Friday.  She continues to tolerate Plaquenil without any side effects and has not missed any doses recently.  She has been experiencing increased pain and intermittent inflammation in multiple joints.  She has been having to take Tylenol and Advil on a daily basis for pain relief.  She has had increased morning stiffness as well as nocturnal pain.  She states last night she was having severe pain in her right knee joint.  She states that she tried applying ice as well as Voltaren gel for symptomatic relief.  She states she is also been having increased pain in both hands and both feet and notices swelling in her left wrist at times.  She has been experiencing significant fatigue on a daily basis. She continues to have chronic lower back pain despite having surgery in July 2022.  She had an injection about 5 weeks ago at Dr. Fredrich Birks office.     Activities of Daily Living:  Patient reports morning stiffness for all day. Patient Reports nocturnal pain.  Difficulty dressing/grooming: Denies Difficulty climbing stairs: Reports Difficulty getting out of chair: Reports Difficulty using hands for taps, buttons, cutlery, and/or writing: Reports  Review of Systems  Constitutional: Positive for fatigue.  HENT: Positive for mouth sores, mouth dryness and nose dryness.   Eyes: Positive for dryness. Negative for pain and itching.  Respiratory: Negative for shortness of breath and difficulty breathing.   Cardiovascular: Negative for chest  pain and palpitations.  Gastrointestinal: Negative for blood in stool, constipation and diarrhea.  Endocrine: Positive for excessive thirst. Negative for increased urination.  Genitourinary: Negative for difficulty urinating.  Musculoskeletal: Positive for arthralgias, joint pain, myalgias, morning stiffness, muscle tenderness and myalgias. Negative for joint swelling.  Skin: Positive for color change. Negative for rash and redness.  Allergic/Immunologic: Negative for susceptible to infections.  Neurological: Positive for numbness, memory loss and weakness. Negative for dizziness and headaches.  Hematological: Negative for bruising/bleeding tendency.  Psychiatric/Behavioral: Negative for confusion.    PMFS History:  Patient Active Problem List   Diagnosis Date Noted  . 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 myelopathy 09/11/2019  . Leukocytosis 11/04/2016  . Fibromyalgia 02/10/2016  . Other fatigue 02/10/2016  . Primary osteoarthritis of both hands 02/10/2016  . History of migraine 02/10/2016  . History of thyroid nodule 02/10/2016  . History of sleep apnea 02/10/2016  . History of renal calculi 02/10/2016  . Pain in joint, shoulder region 10/02/2010  . Adhesive capsulitis of shoulder 10/02/2010  . Muscle weakness (generalized) 10/02/2010    Past Medical History:  Diagnosis Date  . Arthritis   . Asthma 2007  . Depression   . Diabetes mellitus   . Fibromyalgia   . GERD (gastroesophageal reflux disease)   . Gout   . Hematuria   . History of kidney stones   . History of tics   . Interstitial cystitis 2016  . Migraines   .  Osteoarthritis   . PCOS (polycystic ovarian syndrome) 1993  . Pneumonia 2019  . PONV (postoperative nausea and vomiting)   . Sleep apnea   . 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 Thyroid nodule     Family History  Problem Relation Age of Onset  . Fibromyalgia Mother   . Psoriasis Mother        psoriatic  arthritis   . Diabetes Mother   . Heart disease Mother   . Psoriasis Sister        psoriatic arthritis   . Diabetes Sister   . Rheum arthritis Sister   . Fibromyalgia Sister   . Diabetes Sister    Past Surgical History:  Procedure Laterality Date  . ABDOMINAL HYSTERECTOMY  2000  . APPENDECTOMY  1976  . BREAST LUMPECTOMY  1989   lt-negative  . CARDIOVASCULAR STRESS TEST  2000  . CATARACT EXTRACTION W/PHACO Left 12/07/2019   Procedure: CATARACT EXTRACTION PHACO AND INTRAOCULAR LENS PLACEMENT (IOC);  Surgeon: [name], MD;  Location: [facility];  Service: Ophthalmology;  Laterality: Left;  CDE: 5.55  . CHOLECYSTECTOMY  1985  . COLONOSCOPY    . HYSTERECTOMY ABDOMINAL WITH SALPINGECTOMY Bilateral 2000  . KIDNEY STONE SURGERY Right 2014   with stent placement in OR  . KNEE ARTHROSCOPY Right 08/22/2013   Procedure: ARTHROSCOPY RIGHT KNEE FOR INFECTION LAVAGE AND DRAINAGE;  Surgeon: Thera Flake., MD;  Location: [facility];  Service: Orthopedics;  Laterality: Right;  . KNEE ARTHROSCOPY WITH PATELLA RECONSTRUCTION Right 08/06/2013   Procedure: RIGHT KNEE ARTHROSCOPY WITH MENISCECTOMY MEDIAL, ARTHROSCOPY KNEE WITH DEBRIDEMENT/SHAVING (CHONDROPLASTY) ;  Surgeon: Thera Flake., MD;  Location: [facility];  Service: Orthopedics;  Laterality: Right;  . LUMBAR LAMINECTOMY/DECOMPRESSION MICRODISCECTOMY Right 09/11/2019   Procedure: Right Lumbar Three-Four Microdiscectomy;  Surgeon: Maeola Harman, MD;  Location: [facility];  Service: Neurosurgery;  Laterality: Right;  Right Lumbar Three-Four Microdiscectomy  . NASAL SINUS SURGERY     x4  . OOPHORECTOMY    . SHOULDER SURGERY  2011   right shoulder  . THYROID SURGERY  1994  . TONSILLECTOMY Bilateral 1974   Social History   Social History Narrative  . Not on file    There is no immunization history on file for this patient.   Objective: Vital Signs: BP 135/81 (BP Location: Left Arm, Patient Position: Sitting, Cuff  Size: Normal)   Pulse 76   Resp 14   Ht 5\' 4"  (1.626 m)   Wt 171 lb 6.4 oz (77.7 kg)   BMI 29.42 kg/m    Physical Exam Vitals and nursing note reviewed.  Constitutional:      Appearance: She is well-developed and well-nourished.  HENT:     Head: Normocephalic and atraumatic.  Eyes:     Extraocular Movements: EOM normal.     Conjunctiva/sclera: Conjunctivae normal.  Cardiovascular:     Pulses: Intact distal pulses.  Pulmonary:     Effort: Pulmonary effort is normal.  Abdominal:     Palpations: Abdomen is soft.  Musculoskeletal:     Cervical back: Normal range of motion.  Skin:    General: Skin is warm and dry.     Capillary Refill: Capillary refill takes less than 2 seconds.  Neurological:     Mental Status: She is alert and oriented to person, place, and time.  Psychiatric:        Mood and Affect: Mood  and affect normal.        Behavior: Behavior normal.      Musculoskeletal Exam: C-spine has limited range of motion with lateral rotation.  Painful range of motion of lumbar spine.  Shoulder joints and elbow joints have good range of motion with no discomfort.  She has good range of motion of both wrist joints with some tenderness on the dorsal aspect of the left wrist.  Tenderness over bilateral first, second, and third MCPs.  Right fourth MCP joint tenderness noted.  Tenderness over bilateral second and third PIP joints.  She has painful range of motion of the right knee joint but no warmth or effusion was noted.  Tenderness and warmth of the right ankle noted on examination. Pes cavus bilaterally.  Hammertoes noted.  She has tenderness of the first, second, and third MTP joints.  Tenderness over the right fourth MTP.   CDAI Exam: CDAI Score: 14.2  Patient Global: 6 mm; Provider Global: 6 mm Swollen: 1 ; Tender: 21  Joint Exam 04/09/2020      Right  Left  Wrist      Tender  MCP 1   Tender   Tender  MCP 2   Tender   Tender  MCP 3   Tender   Tender  MCP 4   Tender      PIP 2   Tender   Tender  PIP 3   Tender   Tender  Knee   Tender     Ankle  Swollen Tender     MTP 1   Tender   Tender  MTP 2   Tender   Tender  MTP 3   Tender   Tender  MTP 4   Tender        Investigation: No additional findings.  Imaging: XR Foot 2 Views Left  Result Date: 04/09/2020 First MTP, PIP and DIP narrowing was noted.  No intertarsal, tibiotalar or subtalar joint space narrowing was noted.  Posterior calcaneal spur was noted.  No erosive changes were noted. Impression: These findings are consistent with osteoarthritis of the foot.  XR Foot 2 Views Right  Result Date: 04/09/2020 First MTP, PIP and DIP narrowing was noted.  No intertarsal, tibiotalar or subtalar joint space narrowing was noted.  Posterior calcaneal spur was noted. Impression: These findings are consistent with osteoarthritis of the foot.  XR Hand 2 View Left  Result Date: 04/09/2020 CMC, PIP and DIP narrowing was noted.  No MCP, intercarpal or radiocarpal joint space narrowing was noted.  No erosive changes were noted.  No radiographic progression was noted when compared to the x-rays of 2020. Impression: These findings are consistent with osteoarthritis of the hand.  XR Hand 2 View Right  Result Date: 04/09/2020 CMC, PIP and DIP narrowing was noted.  No MCP, intercarpal or radiocarpal joint space narrowing was noted.  No erosive changes were noted.  No radiographic progression was noted when compared to the x-rays of 2020. Impression: These findings are consistent with osteoarthritis of the hand.   Recent Labs: Lab Results  Component Value Date   WBC 10.3 03/24/2020   HGB 13.4 03/24/2020   PLT 344 03/24/2020   NA 139 03/24/2020   K 4.8 03/24/2020   CL 105 03/24/2020   CO2 28 03/24/2020   GLUCOSE 110 (H) 03/24/2020   BUN 19 03/24/2020   CREATININE 0.75 03/24/2020   BILITOT 0.3 03/24/2020   ALKPHOS 59 11/04/2016   AST 17 03/24/2020   ALT 20  03/24/2020   PROT 6.7 03/24/2020   ALBUMIN 4.0  11/04/2016   CALCIUM 9.9 03/24/2020   GFRAA 98 03/24/2020    Speciality Comments: PLQ Eye Exam: 07/31/2019 @ [facility]   Procedures:  No procedures performed Allergies: Sulfa antibiotics, Codeine, Erythromycin, Metronidazole, and Morphine and related   Assessment / Plan:     Visit Diagnoses: Rheumatoid arthritis of multiple sites with negative rheumatoid factor (HCC) - She presents today with pain in multiple joints including the left wrist, both hands, right knee joint, and both feet.  She has noticed intermittent swelling in her left wrist, right ankle, and right foot.  She has been taking Plaquenil 200 mg 1 tablet by mouth twice daily Monday through Friday and has been tolerating it without any side effects.  She has not missed any doses of Plaquenil recently.  On examination today she has mild warmth and tenderness of the right ankle but no other joint swelling or inflammation was noted. She has been experiencing increased morning stiffness as well as nocturnal pain on a daily basis over the past several months.   She has been taking Tylenol and Advil on a daily basis for pain relief.  X-rays of both hands and feet were obtained today which were consistent with osteoarthritic changes.  No radiographic progression was noted in her hands when compared to x-rays from July 2020. No erosive changes noted.  Patient was also evaluated by Dr. Corliss Skains who also did not see any synovitis on exam.  Discussed scheduling ultrasound of both feet to assess for synovitis.  She was advised to avoid taking NSAIDs several days prior to the ultrasound for more accurate results.  She will continue taking Plaquenil 200 mg 1 tablet by mouth twice daily Monday through Friday.  She does not need a refill at this time.  She will follow-up in the office in 5 months.  plan: XR Hand 2 View Right, XR Hand 2 View Left, XR Foot 2 Views Right, XR Foot 2 Views Left  High risk medication use - Plaquenil 200 mg 1 tablet  by mouth twice daily Monday through Friday.  CBC and CMP were updated on 03/24/2020 and were reviewed with the patient today in the office.  She will continue to require lab work every 5 months to monitor for drug toxicity.  PLQ Eye Exam: 07/31/2019 @ [facility].   Primary osteoarthritis of both hands: She has PIP and DIP thickening consistent with osteoarthritis of both hands.  She has tenderness over bilateral 2nd and 3rd PIP joints.  Tenderness over the DIP joints of the right 2nd and 3rd digits.  No synovitis was noted.  She is able to make a complete fist bilaterally.  We discussed the importance of joint protection and muscle strengthening.  Primary osteoarthritis of right knee: She has been experiencing significant discomfort in her right knee joint.  Last night she was experiencing severe nocturnal pain pulsing interrupted sleep.  She has tried using ice, Voltaren gel, and compression without much relief.  On examination today she has good range of motion of the right knee joint with no warmth or effusion.  Primary osteoarthritis of both feet: She has been experiencing severe pain in both feet.  She has also noticed worsening nocturnal pain.  On examination today she has good range of motion of both ankle joints.  She has tenderness and warmth of the right ankle.  Tenderness of the 1st and 2nd MTPs and right 3rd MTP  joint but no synovitis noted.  Pes cavus bilaterally.  Hammertoes noted.  X-rays of both feet were obtained today which were consistent with osteoarthritic changes.  No erosive changes were noted.  Due to the severity and persistence of her feet pain she would like to proceed with an ultrasound of both feet to assess for inflammation.  Fibromyalgia: She has generalized hyperalgesia and positive tender points on exam.  She has been experiencing increased myalgias, arthralgias, and fatigue over the past several months.  She has been experiencing worsening nocturnal pain which has  exacerbated her daily fatigue.  She continues to take Cymbalta 60 mg 2 capsules by mouth daily.  According to the patient and her psychiatrist recently added lithium to her current regimen.  She continues to take baclofen 10 mg 1 tablet twice daily as needed for muscle spasms and tizanidine 4 mg by mouth at bedtime.  Declined referral to pain management at this time.   Trochanteric bursitis of right hip: She has tenderness to palpation over the right trochanteric bursa.  Encouraged to perform stretching exercises daily.   Other fatigue: She has been experiencing significant fatigue on a daily basis.  Her increased fatigue is likely due to insomnia secondary to nocturnal pain.    Chronic SI joint pain: Chronic pain   DDD (degenerative disc disease), lumbar: Chronic pain.  She had an injection about 5 weeks ago at Dr. Fredrich Birks office.    Other medical conditions are listed as follows:   History of gastroesophageal reflux (GERD)  History of positive PPD  History of renal calculi  History of tics  History of sleep apnea  History of PCOS  History of migraine  History of thyroid nodule  History of depression  History of diabetes mellitus  Orders: Orders Placed This Encounter  Procedures  . XR Hand 2 View Right  . XR Hand 2 View Left  . XR Foot 2 Views Right  . XR Foot 2 Views Left   No orders of the defined types were placed in this encounter.    Follow-Up Instructions: Return in about 5 months (around 09/06/2020) for Rheumatoid arthritis, Osteoarthritis.   Gearldine Bienenstock, PA-C  Note - This record has been created using Dragon software.  Chart creation errors have been sought, but may not always  have been located. Such creation errors do not reflect on  the standard of medical care.

## 2020-04-09 ENCOUNTER — Ambulatory Visit: Payer: Self-pay

## 2020-04-09 ENCOUNTER — Ambulatory Visit (INDEPENDENT_AMBULATORY_CARE_PROVIDER_SITE_OTHER): Payer: Medicare Other | Admitting: Physician Assistant

## 2020-04-09 ENCOUNTER — Encounter: Payer: Self-pay | Admitting: Physician Assistant

## 2020-04-09 ENCOUNTER — Other Ambulatory Visit: Payer: Self-pay

## 2020-04-09 VITALS — BP 135/81 | HR 76 | Resp 14 | Ht 64.0 in | Wt 171.4 lb

## 2020-04-09 DIAGNOSIS — M19072 Primary osteoarthritis, left ankle and foot: Secondary | ICD-10-CM | POA: Diagnosis not present

## 2020-04-09 DIAGNOSIS — M533 Sacrococcygeal disorders, not elsewhere classified: Secondary | ICD-10-CM

## 2020-04-09 DIAGNOSIS — Z9289 Personal history of other medical treatment: Secondary | ICD-10-CM | POA: Diagnosis not present

## 2020-04-09 DIAGNOSIS — Z8742 Personal history of other diseases of the female genital tract: Secondary | ICD-10-CM

## 2020-04-09 DIAGNOSIS — M0609 Rheumatoid arthritis without rheumatoid factor, multiple sites: Secondary | ICD-10-CM

## 2020-04-09 DIAGNOSIS — Z79899 Other long term (current) drug therapy: Secondary | ICD-10-CM

## 2020-04-09 DIAGNOSIS — M19071 Primary osteoarthritis, right ankle and foot: Secondary | ICD-10-CM | POA: Diagnosis not present

## 2020-04-09 DIAGNOSIS — M1711 Unilateral primary osteoarthritis, right knee: Secondary | ICD-10-CM

## 2020-04-09 DIAGNOSIS — M19041 Primary osteoarthritis, right hand: Secondary | ICD-10-CM | POA: Diagnosis not present

## 2020-04-09 DIAGNOSIS — Z87442 Personal history of urinary calculi: Secondary | ICD-10-CM | POA: Diagnosis not present

## 2020-04-09 DIAGNOSIS — Z8719 Personal history of other diseases of the digestive system: Secondary | ICD-10-CM | POA: Diagnosis not present

## 2020-04-09 DIAGNOSIS — Z8659 Personal history of other mental and behavioral disorders: Secondary | ICD-10-CM

## 2020-04-09 DIAGNOSIS — G8929 Other chronic pain: Secondary | ICD-10-CM

## 2020-04-09 DIAGNOSIS — M797 Fibromyalgia: Secondary | ICD-10-CM

## 2020-04-09 DIAGNOSIS — M7061 Trochanteric bursitis, right hip: Secondary | ICD-10-CM

## 2020-04-09 DIAGNOSIS — Z8639 Personal history of other endocrine, nutritional and metabolic disease: Secondary | ICD-10-CM

## 2020-04-09 DIAGNOSIS — R5383 Other fatigue: Secondary | ICD-10-CM | POA: Diagnosis not present

## 2020-04-09 DIAGNOSIS — Z8669 Personal history of other diseases of the nervous system and sense organs: Secondary | ICD-10-CM

## 2020-04-09 DIAGNOSIS — M5136 Other intervertebral disc degeneration, lumbar region: Secondary | ICD-10-CM

## 2020-04-09 DIAGNOSIS — M19042 Primary osteoarthritis, left hand: Secondary | ICD-10-CM | POA: Diagnosis not present

## 2020-04-11 DIAGNOSIS — M5416 Radiculopathy, lumbar region: Secondary | ICD-10-CM | POA: Diagnosis not present

## 2020-04-11 DIAGNOSIS — M4126 Other idiopathic scoliosis, lumbar region: Secondary | ICD-10-CM | POA: Diagnosis not present

## 2020-04-11 DIAGNOSIS — M4316 Spondylolisthesis, lumbar region: Secondary | ICD-10-CM | POA: Diagnosis not present

## 2020-04-11 DIAGNOSIS — M545 Low back pain, unspecified: Secondary | ICD-10-CM | POA: Diagnosis not present

## 2020-04-11 DIAGNOSIS — M5126 Other intervertebral disc displacement, lumbar region: Secondary | ICD-10-CM | POA: Diagnosis not present

## 2020-04-13 ENCOUNTER — Other Ambulatory Visit: Payer: Self-pay | Admitting: Physician Assistant

## 2020-04-14 NOTE — Telephone Encounter (Signed)
Last Visit: 04/09/2020 Next Visit: 05/07/2020  Current Dose per office note on 04/09/2020: baclofen 10 mg 1 tablet twice daily as needed  Dx: Fibromyalgia  Last Fill: 03/14/2020  Okay to refill Baclofen?

## 2020-04-28 DIAGNOSIS — M5416 Radiculopathy, lumbar region: Secondary | ICD-10-CM | POA: Diagnosis not present

## 2020-05-01 ENCOUNTER — Other Ambulatory Visit: Payer: Self-pay | Admitting: Physician Assistant

## 2020-05-01 DIAGNOSIS — M0609 Rheumatoid arthritis without rheumatoid factor, multiple sites: Secondary | ICD-10-CM

## 2020-05-01 NOTE — Telephone Encounter (Signed)
Last Visit: 04/09/2020 Next Visit: 05/07/2020 Labs: 03/24/2020, Glucose is 110. Rest of CMP WNL. CBC WNL.  Eye exam: 07/31/2019  Current Dose per office note 04/09/2020, Plaquenil 200 mg 1 tablet by mouth twice daily Monday through Friday DX: Rheumatoid arthritis of multiple sites with negative rheumatoid factor  Last Fill: 02/12/2020  Okay to refill Plaquenil?

## 2020-05-07 ENCOUNTER — Other Ambulatory Visit: Payer: Medicare Other | Admitting: Rheumatology

## 2020-05-07 DIAGNOSIS — M79671 Pain in right foot: Secondary | ICD-10-CM

## 2020-05-07 DIAGNOSIS — M0609 Rheumatoid arthritis without rheumatoid factor, multiple sites: Secondary | ICD-10-CM

## 2020-05-09 ENCOUNTER — Other Ambulatory Visit: Payer: Self-pay | Admitting: Rheumatology

## 2020-05-12 ENCOUNTER — Ambulatory Visit (HOSPITAL_COMMUNITY)
Admission: RE | Admit: 2020-05-12 | Discharge: 2020-05-12 | Disposition: A | Discharge status: Home / Self Care | Payer: Medicare Other | Source: Ambulatory Visit | Attending: Internal Medicine | Admitting: Internal Medicine

## 2020-05-12 DIAGNOSIS — Z1231 Encounter for screening mammogram for malignant neoplasm of breast: Secondary | ICD-10-CM | POA: Insufficient documentation

## 2020-05-12 NOTE — Telephone Encounter (Signed)
Next Visit: 07/16/2020  Last Visit: 04/09/2020  Last Fill: 03/14/2020  Dx: Rheumatoid arthritis of multiple sites with negative rheumatoid factor   Current Dose per office note on 04/09/2020,  tizanidine 4 mg by mouth at bedtime  Okay to refill Zanaflex?

## 2020-06-02 DIAGNOSIS — M5416 Radiculopathy, lumbar region: Secondary | ICD-10-CM | POA: Diagnosis not present

## 2020-06-02 DIAGNOSIS — M5126 Other intervertebral disc displacement, lumbar region: Secondary | ICD-10-CM | POA: Diagnosis not present

## 2020-06-02 DIAGNOSIS — Z6828 Body mass index (BMI) 28.0-28.9, adult: Secondary | ICD-10-CM | POA: Diagnosis not present

## 2020-06-02 DIAGNOSIS — R030 Elevated blood-pressure reading, without diagnosis of hypertension: Secondary | ICD-10-CM | POA: Diagnosis not present

## 2020-06-02 DIAGNOSIS — M545 Low back pain, unspecified: Secondary | ICD-10-CM | POA: Diagnosis not present

## 2020-06-02 DIAGNOSIS — M4126 Other idiopathic scoliosis, lumbar region: Secondary | ICD-10-CM | POA: Diagnosis not present

## 2020-06-04 ENCOUNTER — Ambulatory Visit
Admission: RE | Admit: 2020-06-04 | Discharge: 2020-06-04 | Disposition: A | Discharge status: Home / Self Care | Payer: Medicare Other | Source: Ambulatory Visit | Attending: Allergy | Admitting: Allergy

## 2020-06-04 ENCOUNTER — Other Ambulatory Visit: Payer: Self-pay | Admitting: Allergy

## 2020-06-04 DIAGNOSIS — R0602 Shortness of breath: Secondary | ICD-10-CM

## 2020-06-04 DIAGNOSIS — H1045 Other chronic allergic conjunctivitis: Secondary | ICD-10-CM | POA: Diagnosis not present

## 2020-06-04 DIAGNOSIS — J3089 Other allergic rhinitis: Secondary | ICD-10-CM | POA: Diagnosis not present

## 2020-06-04 DIAGNOSIS — K219 Gastro-esophageal reflux disease without esophagitis: Secondary | ICD-10-CM | POA: Diagnosis not present

## 2020-06-12 DIAGNOSIS — Z299 Encounter for prophylactic measures, unspecified: Secondary | ICD-10-CM | POA: Diagnosis not present

## 2020-06-12 DIAGNOSIS — J329 Chronic sinusitis, unspecified: Secondary | ICD-10-CM | POA: Diagnosis not present

## 2020-06-30 NOTE — Progress Notes (Signed)
Office Visit Note  Patient: Christina Lewis             Date of Birth: 1955/03/30           MRN: [id number]             PCP: Kirstie Peri, MD Referring: Kirstie Peri, MD Visit Date: 07/01/2020 Occupation: @GUAROCC @  Subjective:  Right knee pain   History of Present Illness: Christina Lewis is a 65 y.o. female with history of rheumatoid arthritis and osteoarthritis.  She is taking plaquenil 200 mg 1 tablet by mouth twice daily Monday through Friday.  She has not missed any doses of Plaquenil recently.  She denies any recent rheumatoid arthritis flares.  She states that on Friday she slipped in her bathroom and her right knee hit the side of the tub and she landed in the tub.  She states that she initially noticed severe pain and swelling but denies any bruising.  She has been experiencing a buckling sensation in her right knee feels as though it is locking at times.  Her pain is most severe when she is bending her right knee.  She has tried taking Tylenol and ibuprofen with not much improvement in her symptoms.  She is also tried using Voltaren gel topically as needed for pain relief.  She has applied ice which provides temporary relief.  She denies any discomfort in her left knee joint.  She states that she continues to have ongoing pain in both feet but denies any joint swelling.  She continues to take ginger and turmeric on a daily basis.  She experiences intermittent myalgias and muscle tenderness due to underlying fibromyalgia.  Activities of Daily Living:  Patient reports morning stiffness for all day.  Patient Reports nocturnal pain.  Difficulty dressing/grooming: Reports Difficulty climbing stairs: Reports Difficulty getting out of chair: Reports Difficulty using hands for taps, buttons, cutlery, and/or writing: Reports  Review of Systems  Constitutional: Positive for fatigue.  HENT: Positive for mouth sores, mouth dryness and nose dryness.   Eyes: Positive for pain, itching and  dryness.  Respiratory: Negative for shortness of breath and difficulty breathing.   Cardiovascular: Negative for chest pain and palpitations.  Gastrointestinal: Positive for constipation. Negative for blood in stool and diarrhea.  Endocrine: Negative for increased urination.  Genitourinary: Negative for difficulty urinating.  Musculoskeletal: Positive for arthralgias, joint pain, myalgias, morning stiffness, muscle tenderness and myalgias. Negative for joint swelling.  Skin: Positive for color change. Negative for rash and redness.  Allergic/Immunologic: Negative for susceptible to infections.  Neurological: Positive for numbness, headaches and weakness. Negative for dizziness and memory loss.  Hematological: Negative for bruising/bleeding tendency.  Psychiatric/Behavioral: Positive for sleep disturbance. Negative for confusion.    PMFS History:  Patient Active Problem List   Diagnosis Date Noted  . 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 myelopathy 09/11/2019  . Leukocytosis 11/04/2016  . Fibromyalgia 02/10/2016  . Other fatigue 02/10/2016  . Primary osteoarthritis of both hands 02/10/2016  . History of migraine 02/10/2016  . History of thyroid nodule 02/10/2016  . History of sleep apnea 02/10/2016  . History of renal calculi 02/10/2016  . Pain in joint, shoulder region 10/02/2010  . Adhesive capsulitis of shoulder 10/02/2010  . Muscle weakness (generalized) 10/02/2010    Past Medical History:  Diagnosis Date  . Arthritis   . Asthma 2007  . Depression   . Diabetes mellitus   . Fibromyalgia   . GERD (gastroesophageal reflux disease)   . Gout   .  Hematuria   . History of kidney stones   . History of tics   . Interstitial cystitis 2016  . Migraines   . Osteoarthritis   . PCOS (polycystic ovarian syndrome) 1993  . Pneumonia 2019  . PONV (postoperative nausea and vomiting)   . Sleep apnea   . 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  Thyroid nodule     Family History  Problem Relation Age of Onset  . Fibromyalgia Mother   . Psoriasis Mother        psoriatic arthritis   . Diabetes Mother   . Heart disease Mother   . Psoriasis Sister        psoriatic arthritis   . Diabetes Sister   . Rheum arthritis Sister   . Fibromyalgia Sister   . Diabetes Sister    Past Surgical History:  Procedure Laterality Date  . ABDOMINAL HYSTERECTOMY  2000  . APPENDECTOMY  1976  . BREAST LUMPECTOMY  1989   lt-negative  . CARDIOVASCULAR STRESS TEST  2000  . CATARACT EXTRACTION W/PHACO Left 12/07/2019   Procedure: CATARACT EXTRACTION PHACO AND INTRAOCULAR LENS PLACEMENT (IOC);  Surgeon: [name], MD;  Location: [facility];  Service: Ophthalmology;  Laterality: Left;  CDE: 5.55  . CHOLECYSTECTOMY  1985  . COLONOSCOPY    . HYSTERECTOMY ABDOMINAL WITH SALPINGECTOMY Bilateral 2000  . KIDNEY STONE SURGERY Right 2014   with stent placement in OR  . KNEE ARTHROSCOPY Right 08/22/2013   Procedure: ARTHROSCOPY RIGHT KNEE FOR INFECTION LAVAGE AND DRAINAGE;  Surgeon: Thera Flake., MD;  Location: [facility];  Service: Orthopedics;  Laterality: Right;  . KNEE ARTHROSCOPY WITH PATELLA RECONSTRUCTION Right 08/06/2013   Procedure: RIGHT KNEE ARTHROSCOPY WITH MENISCECTOMY MEDIAL, ARTHROSCOPY KNEE WITH DEBRIDEMENT/SHAVING (CHONDROPLASTY) ;  Surgeon: Thera Flake., MD;  Location: [facility];  Service: Orthopedics;  Laterality: Right;  . LUMBAR LAMINECTOMY/DECOMPRESSION MICRODISCECTOMY Right 09/11/2019   Procedure: Right Lumbar Three-Four Microdiscectomy;  Surgeon: Maeola Harman, MD;  Location: [facility];  Service: Neurosurgery;  Laterality: Right;  Right Lumbar Three-Four Microdiscectomy  . NASAL SINUS SURGERY     x4  . OOPHORECTOMY    . SHOULDER SURGERY  2011   right shoulder  . THYROID SURGERY  1994  . TONSILLECTOMY Bilateral 1974   Social History   Social History Narrative  . Not on file    There is no  immunization history on file for this patient.   Objective: Vital Signs: BP 137/81 (BP Location: Right Arm, Patient Position: Sitting, Cuff Size: Normal)   Pulse 78   Resp 13   Ht 5\' 4"  (1.626 m)   Wt 176 lb 12.8 oz (80.2 kg)   BMI 30.35 kg/m    Physical Exam Vitals and nursing note reviewed.  Constitutional:      Appearance: She is well-developed.  HENT:     Head: Normocephalic and atraumatic.  Eyes:     Conjunctiva/sclera: Conjunctivae normal.  Pulmonary:     Effort: Pulmonary effort is normal.  Abdominal:     Palpations: Abdomen is soft.  Musculoskeletal:     Cervical back: Normal range of motion.  Skin:    General: Skin is warm and dry.     Capillary Refill: Capillary refill takes less than 2 seconds.  Neurological:     Mental Status: She is alert and oriented to person, place, and time.  Psychiatric:  Behavior: Behavior normal.      Musculoskeletal Exam: Hyperalgesia and positive tender points on exam.  C-spine, thoracic spine, lumbar spine have good range of motion with no discomfort.  Shoulder joints, elbow joints, wrist joints, MCPs, PIPs, DIPs have good range of motion with no synovitis.  She is able to make a complete fist bilaterally.  Hip joints have good range of motion with some discomfort in the right hip.  She has limited extension and pain with flexion of the right knee.  Right knee crepitus noted.  Left knee has good range of motion with no discomfort.  Ankle joints have good range of motion with no tenderness or inflammation.  CDAI Exam: CDAI Score: -- Patient Global: --; Provider Global: -- Swollen: --; Tender: -- Joint Exam 07/01/2020   No joint exam has been documented for this visit   There is currently no information documented on the homunculus. Go to the Rheumatology activity and complete the homunculus joint exam.  Investigation: No additional findings.  Imaging: DG Chest 2 View  Result Date: 06/05/2020 CLINICAL DATA:  Short of  breath for 2 months. EXAM: CHEST - 2 VIEW COMPARISON:  11/06/2010 FINDINGS: The heart size and mediastinal contours are within normal limits. Both lungs are clear. No pleural effusion or pneumothorax. The visualized skeletal structures are unremarkable. IMPRESSION: No active cardiopulmonary disease. Electronically Signed   By: Amie Portland M.D.   On: 06/05/2020 16:48    Recent Labs: Lab Results  Component Value Date   WBC 10.3 03/24/2020   HGB 13.4 03/24/2020   PLT 344 03/24/2020   NA 139 03/24/2020   K 4.8 03/24/2020   CL 105 03/24/2020   CO2 28 03/24/2020   GLUCOSE 110 (H) 03/24/2020   BUN 19 03/24/2020   CREATININE 0.75 03/24/2020   BILITOT 0.3 03/24/2020   ALKPHOS 59 11/04/2016   AST 17 03/24/2020   ALT 20 03/24/2020   PROT 6.7 03/24/2020   ALBUMIN 4.0 11/04/2016   CALCIUM 9.9 03/24/2020   GFRAA 98 03/24/2020    Speciality Comments: PLQ Eye Exam: 07/31/2019 @ [facility]   Procedures:  No procedures performed Allergies: Sulfa antibiotics, Codeine, Erythromycin, Metronidazole, and Morphine and related   Assessment / Plan:     Visit Diagnoses: Rheumatoid arthritis of multiple sites with negative rheumatoid factor (HCC): She presents today with acute on chronic pain in the right knee joint after a fall on Friday.  She has painful flexion of the right knee and limited extension with crepitus on exam.  X-rays of the right knee were obtained today and the right knee was injected with cortisone as requested.  She continues to experience significant pain and stiffness in both feet.  She has been using Voltaren gel topically as needed and wearing proper fitting shoes.  She has scheduled for an ultrasound of both feet on 07/16/2020 to assess for synovitis.  She remains on Plaquenil 200 mg 1 tablet by mouth twice daily Monday through Friday.  We will schedule a follow-up after having an ultrasound performed.   High risk medication use - Plaquenil 200 mg 1 tablet by mouth twice  daily Monday through Friday.PLQ Eye Exam: 07/31/2019 @ [facility].  She was given a Plaquenil eye exam form to take with her to her upcoming appointment.  CBC and CMP were drawn on 03/24/2020.  Her lab work will be due at the end of Christina 2022.  Standing orders are in place. She has not had any  recent infections.  Primary osteoarthritis of both hands: She has PIP and DIP thickening consistent with osteoarthritis of both hands.  She has no joint tenderness or inflammation on examination today.  She experiences intermittent pain and stiffness in both hands.  Discussed the importance of joint protection and muscle strengthening.  Primary osteoarthritis of right knee: She presents today with acute on chronic pain in the right knee after a fall on Friday.  X-rays of the right knee were updated today.  She requested a right knee joint cortisone injection.  She tolerated procedure well.  Procedure note was completed above.  Acute  pain of right knee -She presents today with acute on chronic pain in the right knee joint.  According to the patient she fell while in the bathroom on Friday and hit her right knee on the side of the bathtub and ended up falling into the bathtub.  She initially noted swelling and severe pain in the right knee.  She has tried taking Tylenol and ibuprofen for pain relief.  She has also tried applying ice and Voltaren gel topically which has provided temporary relief.  She has been experiencing significant nocturnal pain causing difficulty falling asleep and interrupted sleep at night.  She is also been experiencing a buckling sensation intermittently.  In the past she had arthroscopic surgery on the right knee for torn meniscus but according to the patient her symptoms are not similar to when her meniscus was torn.   On examination today she has painful flexion of the right knee and slightly limited extension.  Right knee crepitus and mild warmth was noted.  No ecchymosis or  erythema noted.  X-rays of the right knee were obtained today for further evaluation.  Results were reviewed with the patient today in the office.  Different treatment options were discussed.  After informed consent the right knee joint was injected with cortisone.  She tolerated the procedure well.  Procedure note was completed above.  Aftercare was discussed.  Advised to monitor blood glucose and blood pressure closely following the cortisone injection today.  She was encouraged to continue to ice, elevate, use compression, and topical agents for pain relief.  She was advised to notify us if her symptoms persist or worsen.  Plan: XR KNEE 3 VIEW RIGHT  Primary osteoarthritis of both feet: She continues to have chronic pain in both feet.  She has good range of motion of both ankle joints with no tenderness or synovitis at this time.  We discussed the importance of wearing proper fitting shoes.  She continues to take turmeric and ginger on a daily basis.  She also uses Voltaren gel topically as needed for pain relief.  She is scheduled for an ultrasound of both feet on 07/16/2020 to evaluate for active synovitis.  Fibromyalgia: She has generalized hyperalgesia and positive tender points on exam.  She experiences intermittent myalgias and muscle tenderness due to underlying fibromyalgia.  Overall her symptoms have been tolerable.  She has been taking baclofen 10 mg twice daily as needed during the day and tizanidine 4 mg at bedtime as needed for muscle spasms.  She remains on Cymbalta 120 mg daily.  She requested a refill of tizanidine to be sent to the pharmacy. We discussed the importance of regular exercise and good sleep hygiene.  Trochanteric bursitis of right hip: She experiences intermittent discomfort due to right trochanter bursitis.  Encouraged to continue to perform daily exercises.  Other fatigue: Chronic and exacerbated by not sleeping well  over the past few nights due to nocturnal pain in the  right knee.  Chronic SI joint pain: She has tenderness over both SI joints.  DDD (degenerative disc disease), lumbar: She continues to experience intermittent discomfort in her lower back.  Other medical conditions are listed as follows:   History of gastroesophageal reflux (GERD)  History of positive PPD  History of renal calculi  History of tics  History of sleep apnea  History of migraine  History of thyroid nodule  History of PCOS  History of depression  History of diabetes mellitus    Orders: Orders Placed This Encounter  Procedures  . XR KNEE 3 VIEW RIGHT   Meds ordered this encounter  Medications  . tiZANidine (ZANAFLEX) 4 MG tablet    Sig: Take 1 tablet (4 mg total) by mouth at bedtime as needed.    Dispense:  30 tablet    Refill:  0    Follow-Up Instructions: Return in about 5 months (around 12/01/2020) for Rheumatoid arthritis, Osteoarthritis.   Gearldine Bienenstock, PA-C  Note - This record has been created using Dragon software.  Chart creation errors have been sought, but may not always  have been located. Such creation errors do not reflect on  the standard of medical care.

## 2020-07-01 ENCOUNTER — Ambulatory Visit (INDEPENDENT_AMBULATORY_CARE_PROVIDER_SITE_OTHER): Payer: Medicare Other | Admitting: Physician Assistant

## 2020-07-01 ENCOUNTER — Other Ambulatory Visit: Payer: Self-pay

## 2020-07-01 ENCOUNTER — Ambulatory Visit: Payer: Self-pay

## 2020-07-01 ENCOUNTER — Other Ambulatory Visit: Payer: Self-pay | Admitting: Physician Assistant

## 2020-07-01 ENCOUNTER — Encounter: Payer: Self-pay | Admitting: Physician Assistant

## 2020-07-01 VITALS — BP 137/81 | HR 78 | Resp 13 | Ht 64.0 in | Wt 176.8 lb

## 2020-07-01 DIAGNOSIS — M19072 Primary osteoarthritis, left ankle and foot: Secondary | ICD-10-CM

## 2020-07-01 DIAGNOSIS — Z87442 Personal history of urinary calculi: Secondary | ICD-10-CM

## 2020-07-01 DIAGNOSIS — R5383 Other fatigue: Secondary | ICD-10-CM

## 2020-07-01 DIAGNOSIS — Z9289 Personal history of other medical treatment: Secondary | ICD-10-CM | POA: Diagnosis not present

## 2020-07-01 DIAGNOSIS — M533 Sacrococcygeal disorders, not elsewhere classified: Secondary | ICD-10-CM | POA: Diagnosis not present

## 2020-07-01 DIAGNOSIS — M1711 Unilateral primary osteoarthritis, right knee: Secondary | ICD-10-CM

## 2020-07-01 DIAGNOSIS — M7061 Trochanteric bursitis, right hip: Secondary | ICD-10-CM

## 2020-07-01 DIAGNOSIS — M51369 Other intervertebral disc degeneration, lumbar region without mention of lumbar back pain or lower extremity pain: Secondary | ICD-10-CM

## 2020-07-01 DIAGNOSIS — M797 Fibromyalgia: Secondary | ICD-10-CM | POA: Diagnosis not present

## 2020-07-01 DIAGNOSIS — M19041 Primary osteoarthritis, right hand: Secondary | ICD-10-CM | POA: Diagnosis not present

## 2020-07-01 DIAGNOSIS — Z79899 Other long term (current) drug therapy: Secondary | ICD-10-CM

## 2020-07-01 DIAGNOSIS — Z8742 Personal history of other diseases of the female genital tract: Secondary | ICD-10-CM

## 2020-07-01 DIAGNOSIS — M0609 Rheumatoid arthritis without rheumatoid factor, multiple sites: Secondary | ICD-10-CM

## 2020-07-01 DIAGNOSIS — M19042 Primary osteoarthritis, left hand: Secondary | ICD-10-CM

## 2020-07-01 DIAGNOSIS — M25561 Pain in right knee: Secondary | ICD-10-CM | POA: Diagnosis not present

## 2020-07-01 DIAGNOSIS — M19071 Primary osteoarthritis, right ankle and foot: Secondary | ICD-10-CM | POA: Diagnosis not present

## 2020-07-01 DIAGNOSIS — G8929 Other chronic pain: Secondary | ICD-10-CM

## 2020-07-01 DIAGNOSIS — Z8719 Personal history of other diseases of the digestive system: Secondary | ICD-10-CM | POA: Diagnosis not present

## 2020-07-01 DIAGNOSIS — Z8669 Personal history of other diseases of the nervous system and sense organs: Secondary | ICD-10-CM

## 2020-07-01 DIAGNOSIS — M5136 Other intervertebral disc degeneration, lumbar region: Secondary | ICD-10-CM | POA: Diagnosis not present

## 2020-07-01 DIAGNOSIS — Z8639 Personal history of other endocrine, nutritional and metabolic disease: Secondary | ICD-10-CM

## 2020-07-01 DIAGNOSIS — Z8659 Personal history of other mental and behavioral disorders: Secondary | ICD-10-CM

## 2020-07-01 MED ORDER — TIZANIDINE HCL 4 MG PO TABS: 4.0000 mg | ORAL_TABLET | Freq: Every evening | ORAL | 0 refills | Status: DC | PRN

## 2020-07-02 NOTE — Telephone Encounter (Signed)
Next Visit: 12/03/2020  Last Visit: 07/01/2020  Last Fill: 04/14/2020  Dx: Fibromyalgia  Current Dose per office note on 07/01/2020, baclofen 10 mg twice daily as needed during the day  Okay to refill Baclofen?

## 2020-07-16 ENCOUNTER — Ambulatory Visit: Payer: Self-pay

## 2020-07-16 ENCOUNTER — Ambulatory Visit (INDEPENDENT_AMBULATORY_CARE_PROVIDER_SITE_OTHER): Payer: Medicare Other | Admitting: Rheumatology

## 2020-07-16 ENCOUNTER — Other Ambulatory Visit: Payer: Self-pay

## 2020-07-16 DIAGNOSIS — M79672 Pain in left foot: Secondary | ICD-10-CM

## 2020-07-16 DIAGNOSIS — M0609 Rheumatoid arthritis without rheumatoid factor, multiple sites: Secondary | ICD-10-CM

## 2020-07-16 DIAGNOSIS — M79671 Pain in right foot: Secondary | ICD-10-CM | POA: Diagnosis not present

## 2020-07-24 DIAGNOSIS — E282 Polycystic ovarian syndrome: Secondary | ICD-10-CM | POA: Diagnosis not present

## 2020-07-24 DIAGNOSIS — E559 Vitamin D deficiency, unspecified: Secondary | ICD-10-CM | POA: Diagnosis not present

## 2020-07-24 DIAGNOSIS — R5383 Other fatigue: Secondary | ICD-10-CM | POA: Diagnosis not present

## 2020-07-24 DIAGNOSIS — E7800 Pure hypercholesterolemia, unspecified: Secondary | ICD-10-CM | POA: Diagnosis not present

## 2020-07-24 DIAGNOSIS — E049 Nontoxic goiter, unspecified: Secondary | ICD-10-CM | POA: Diagnosis not present

## 2020-07-24 DIAGNOSIS — M069 Rheumatoid arthritis, unspecified: Secondary | ICD-10-CM | POA: Diagnosis not present

## 2020-07-24 DIAGNOSIS — E1165 Type 2 diabetes mellitus with hyperglycemia: Secondary | ICD-10-CM | POA: Diagnosis not present

## 2020-07-24 DIAGNOSIS — R1310 Dysphagia, unspecified: Secondary | ICD-10-CM | POA: Diagnosis not present

## 2020-07-30 ENCOUNTER — Other Ambulatory Visit: Payer: Self-pay | Admitting: Physician Assistant

## 2020-07-30 DIAGNOSIS — M0609 Rheumatoid arthritis without rheumatoid factor, multiple sites: Secondary | ICD-10-CM

## 2020-07-30 NOTE — Telephone Encounter (Signed)
Next Visit: 12/03/2020  Last Visit: 07/01/2020  Last Fill: 05/01/2020 PLQ, 07/01/2020 Tizanidine  Dx: Fibromyalgia, Rheumatoid arthritis of multiple sites with negative rheumatoid factor   PLQ Eye Exam: 07/31/2019  Labs: 03/24/2020 Glucose is 110. Rest of CMP WNL. CBC WNL.   Current Dose per office note on 07/01/2020, tizanidine 4 mg at bedtime as needed for muscle spasms Plaquenil 200 mg 1 tablet by mouth twice daily Monday through Friday  Okay to refill Tizanidine and PLQ?

## 2020-08-26 DIAGNOSIS — J329 Chronic sinusitis, unspecified: Secondary | ICD-10-CM | POA: Diagnosis not present

## 2020-08-26 DIAGNOSIS — Z299 Encounter for prophylactic measures, unspecified: Secondary | ICD-10-CM | POA: Diagnosis not present

## 2020-09-04 ENCOUNTER — Ambulatory Visit: Payer: BC Managed Care – PPO | Admitting: Rheumatology

## 2020-09-04 ENCOUNTER — Telehealth: Payer: Medicare Other | Admitting: Physician Assistant

## 2020-09-04 DIAGNOSIS — U071 COVID-19: Secondary | ICD-10-CM

## 2020-09-04 NOTE — Progress Notes (Signed)
Based on what you shared with me, I feel your condition warrants further evaluation and I recommend that you be seen in a face to face visit. Giving continued/worsening symptoms despite initial treatment, it is recommended you be evaluated in person either at your PCP office (if they have designated plan for COVID + patients) or at a local Urgent Care to get an examination of the lungs, assessment of vital signs and to make sure an x-ray is not needed for potential pneumonia secondary to COVID.    NOTE: There will be NO CHARGE for this eVisit   If you are having a true medical emergency please call 911.      For an urgent face to face visit, [facility] has six urgent care centers for your convenience:     [facility] at [facility] Directions [phone] [address] [place], [place] [address]    [facility] Center [facility]) Get Driving Directions [phone] [address] [place], [place] [address]  [facility]) Get Driving Directions [phone] [address] [place],  [place]  [address]  [facility] at [facility] Get Driving Directions [phone] [address], [address] [place], [place] [address]   [facility] Urgent Care at [facility] Get Driving Directions  [phone] [address] [place], [place] [address]   [facility]gent Care at [facility] Directions [phone] [address] [place], [place] [address]  Your MyChart E-visit questionnaire answers were reviewed by a board certified advanced clinical practitioner to complete your personal care plan based on your specific symptoms.  Thank you for using e-Visits.

## 2020-09-05 ENCOUNTER — Ambulatory Visit: Payer: Medicare Other

## 2020-09-05 ENCOUNTER — Other Ambulatory Visit: Payer: Self-pay | Admitting: Physician Assistant

## 2020-09-05 NOTE — Telephone Encounter (Signed)
Next Visit: 12/03/2020   Last Visit: 07/01/2020   Last Fill: 07/30/2020   Dx: Fibromyalgia   Current Dose per office note on 07/01/2020,tizanidine 4 mg at bedtime as needed for muscle spasms.  Okay to refill Tizanidine?

## 2020-09-10 ENCOUNTER — Ambulatory Visit: Payer: Medicare Other

## 2020-09-11 ENCOUNTER — Ambulatory Visit
Admission: EM | Admit: 2020-09-11 | Discharge: 2020-09-11 | Disposition: A | Discharge status: Home / Self Care | Payer: Medicare Other

## 2020-09-11 ENCOUNTER — Encounter: Payer: Self-pay | Admitting: Emergency Medicine

## 2020-09-11 ENCOUNTER — Emergency Department (HOSPITAL_COMMUNITY)
Admission: EM | Admit: 2020-09-11 | Discharge: 2020-09-11 | Disposition: A | Discharge status: Home / Self Care | Payer: Medicare Other | Attending: Emergency Medicine | Admitting: Emergency Medicine

## 2020-09-11 ENCOUNTER — Encounter (HOSPITAL_COMMUNITY): Payer: Self-pay | Admitting: Emergency Medicine

## 2020-09-11 ENCOUNTER — Emergency Department (HOSPITAL_COMMUNITY): Payer: Medicare Other

## 2020-09-11 ENCOUNTER — Other Ambulatory Visit: Payer: Self-pay

## 2020-09-11 DIAGNOSIS — J45909 Unspecified asthma, uncomplicated: Secondary | ICD-10-CM | POA: Diagnosis not present

## 2020-09-11 DIAGNOSIS — R55 Syncope and collapse: Secondary | ICD-10-CM | POA: Diagnosis not present

## 2020-09-11 DIAGNOSIS — Z7982 Long term (current) use of aspirin: Secondary | ICD-10-CM | POA: Diagnosis not present

## 2020-09-11 DIAGNOSIS — E119 Type 2 diabetes mellitus without complications: Secondary | ICD-10-CM | POA: Insufficient documentation

## 2020-09-11 DIAGNOSIS — R519 Headache, unspecified: Secondary | ICD-10-CM | POA: Insufficient documentation

## 2020-09-11 LAB — CBC
HCT: 41.1 % (ref 36.0–46.0)
Hemoglobin: 13.4 g/dL (ref 12.0–15.0)
MCH: 31.1 pg (ref 26.0–34.0)
MCHC: 32.6 g/dL (ref 30.0–36.0)
MCV: 95.4 fL (ref 80.0–100.0)
Platelets: 367 10*3/uL (ref 150–400)
RBC: 4.31 MIL/uL (ref 3.87–5.11)
RDW: 13.2 % (ref 11.5–15.5)
WBC: 10.4 10*3/uL (ref 4.0–10.5)
nRBC: 0 % (ref 0.0–0.2)

## 2020-09-11 LAB — BASIC METABOLIC PANEL
Anion gap: 8 (ref 5–15)
BUN: 15 mg/dL (ref 8–23)
CO2: 27 mmol/L (ref 22–32)
Calcium: 9.4 mg/dL (ref 8.9–10.3)
Chloride: 103 mmol/L (ref 98–111)
Creatinine, Ser: 0.79 mg/dL (ref 0.44–1.00)
GFR, Estimated: >60 mL/min (ref >60)
Glucose, Bld: 117 mg/dL — ABNORMAL HIGH (ref 70–99)
Potassium: 3.9 mmol/L (ref 3.5–5.1)
Sodium: 138 mmol/L (ref 135–145)

## 2020-09-11 LAB — URINALYSIS, ROUTINE W REFLEX MICROSCOPIC
Bacteria, UA: NONE SEEN
Bilirubin Urine: NEGATIVE
Glucose, UA: NEGATIVE mg/dL
Ketones, ur: NEGATIVE mg/dL
Nitrite: NEGATIVE
Protein, ur: NEGATIVE mg/dL
Specific Gravity, Urine: 1.02 (ref 1.005–1.030)
pH: 6 (ref 5.0–8.0)

## 2020-09-11 LAB — CBG MONITORING, ED: Glucose-Capillary: 111 mg/dL — ABNORMAL HIGH (ref 70–99)

## 2020-09-11 MED ORDER — SODIUM CHLORIDE 0.9 % IV BOLUS
1000.0000 mL | Freq: Once | INTRAVENOUS | Status: AC
  Administered 2020-09-11: 1000 mL via INTRAVENOUS

## 2020-09-11 MED ORDER — DEXAMETHASONE SODIUM PHOSPHATE 10 MG/ML IJ SOLN
10.0000 mg | Freq: Once | INTRAMUSCULAR | Status: AC
  Administered 2020-09-11: 10 mg via INTRAVENOUS
  Filled 2020-09-11: qty 1

## 2020-09-11 MED ORDER — KETOROLAC TROMETHAMINE 15 MG/ML IJ SOLN
15.0000 mg | Freq: Once | INTRAMUSCULAR | Status: AC
  Administered 2020-09-11: 15 mg via INTRAVENOUS
  Filled 2020-09-11: qty 1

## 2020-09-11 MED ORDER — METOCLOPRAMIDE HCL 5 MG/ML IJ SOLN
10.0000 mg | Freq: Once | INTRAMUSCULAR | Status: AC
  Administered 2020-09-11: 10 mg via INTRAVENOUS
  Filled 2020-09-11: qty 2

## 2020-09-11 NOTE — ED Notes (Signed)
Pt ambulatory to and from the bathroom ?

## 2020-09-11 NOTE — ED Notes (Signed)
Syncopal episode x 2 yesterday. Also has a headache, referred from urgent care

## 2020-09-11 NOTE — ED Notes (Signed)
Pt stats that she is feeling much better, states that she is ready to go home, sig other at bedside, pt verbalized d/c instructions and follow up, ambulatory from dpt with steady gait.

## 2020-09-11 NOTE — ED Notes (Signed)
Patient is being discharged from the Urgent Care and sent to the Emergency Department via pov . Per [place], PA-C, patient is in need of higher level of care due to syncopal episode . Patient is aware and verbalizes understanding of plan of care.  Vitals:   09/11/20 1154  BP: 121/78  Pulse: 78  Resp: 17  Temp: 97.8 F (36.6 C)  SpO2: 98%

## 2020-09-11 NOTE — ED Notes (Addendum)
Pt reports some itchiness around her neck, but then states that it has stopped

## 2020-09-11 NOTE — ED Triage Notes (Signed)
Migraine that she cant get rid of, hx of migraines x 4days.   Painful rash on upper chest . Pt states she did fall x 4 days ago and passed out and fell the past 2 days.  Unsure if she hit her head.

## 2020-09-11 NOTE — ED Provider Notes (Signed)
[facility] EMERGENCY DEPARTMENT Provider Note   CSN: [id number] Arrival date & time: 09/11/20  1218     History Chief Complaint  Patient presents with   Near Syncope    ORTHA MERENESS is a 65 y.o. female.  HPI     Christina Lewis is a 65 y.o. female, with a history of DM, fibromyalgia, asthma, arthritis, GERD, presenting to the ED with headache for the last several days. Her headache is in the left frontal region, moderate, throbbing, radiating along the left side of the head towards the neck.  She states this headache is consistent with previous headaches that have been diagnosed as migraines.  She decided to come to the ED because the headache has not responded to treatment at home.  She endorses a couple near-syncopal episodes in the last couple days.  The most recent of these was last night.  States she was standing began to feel lightheaded and her vision close in.  She had to lean back against the wall and slide down the wall. She did not hit her head.  Husband at the bedside states he was in the room with her at the time.  She almost immediately opened her eyes and was oriented.  No seizure activity noted.  She notes erythema that she refers to as a rash to her upper chest, neck, and upper arms present for the past 2 to 3 days.  Her only medication changes were that she was tried on a different depression medication last week.  She was on this medication for 2 weeks, but this did not improve her depression symptoms.  She was returned to her Cymbalta 2 days ago. She was diagnosed with COVID 3 weeks ago. Denies fever/chills, chest pain, shortness of breath, numbness, weakness, acute neck/back pain, vision changes, N/V/D, abdominal pain, or any other complaints.   Past Medical History:  Diagnosis Date   Arthritis    Asthma 2007   Depression    Diabetes mellitus    Fibromyalgia    GERD (gastroesophageal reflux disease)    Gout    Hematuria    History of kidney stones     History of tics    Interstitial cystitis 2016   Migraines    Osteoarthritis    PCOS (polycystic ovarian syndrome) 1993   Pneumonia 2019   PONV (postoperative nausea and vomiting)    Sleep apnea    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, took medication for a year   Thyroid nodule     Patient Active Problem List   Diagnosis Date Noted   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 myelopathy 09/11/2019   Leukocytosis 11/04/2016   Fibromyalgia 02/10/2016   Other fatigue 02/10/2016   Primary osteoarthritis of both hands 02/10/2016   History of migraine 02/10/2016   History of thyroid nodule 02/10/2016   History of sleep apnea 02/10/2016   History of renal calculi 02/10/2016   Pain in joint, shoulder region 10/02/2010   Adhesive capsulitis of shoulder 10/02/2010   Muscle weakness (generalized) 10/02/2010    Past Surgical History:  Procedure Laterality Date   ABDOMINAL HYSTERECTOMY  2000   APPENDECTOMY  1976   BREAST LUMPECTOMY  1989   lt-negative   CARDIOVASCULAR STRESS TEST  2000   CATARACT EXTRACTION W/PHACO Left 12/07/2019   Procedure: CATARACT EXTRACTION PHACO AND INTRAOCULAR LENS PLACEMENT (IOC);  Surgeon: [name], MD;  Location: [facility];  Service: Ophthalmology;  Laterality: Left;  CDE: 5.55   CHOLECYSTECTOMY  1985   COLONOSCOPY     HYSTERECTOMY ABDOMINAL WITH SALPINGECTOMY Bilateral 2000   KIDNEY STONE SURGERY Right 2014   with stent placement in OR   KNEE ARTHROSCOPY Right 08/22/2013   Procedure: ARTHROSCOPY RIGHT KNEE FOR INFECTION LAVAGE AND DRAINAGE;  Surgeon: Thera Flake., MD;  Location: [facility];  Service: Orthopedics;  Laterality: Right;   KNEE ARTHROSCOPY WITH PATELLA RECONSTRUCTION Right 08/06/2013   Procedure: RIGHT KNEE ARTHROSCOPY WITH MENISCECTOMY MEDIAL, ARTHROSCOPY KNEE WITH DEBRIDEMENT/SHAVING (CHONDROPLASTY) ;  Surgeon: Thera Flake., MD;  Location: [facility];  Service: Orthopedics;  Laterality: Right;    LUMBAR LAMINECTOMY/DECOMPRESSION MICRODISCECTOMY Right 09/11/2019   Procedure: Right Lumbar Three-Four Microdiscectomy;  Surgeon: Maeola Harman, MD;  Location: [facility] OR;  Service: Neurosurgery;  Laterality: Right;  Right Lumbar Three-Four Microdiscectomy   NASAL SINUS SURGERY     x4   OOPHORECTOMY     SHOULDER SURGERY  2011   right shoulder   THYROID SURGERY  1994   TONSILLECTOMY Bilateral 1974     OB History   No obstetric history on file.     Family History  Problem Relation Age of Onset   Fibromyalgia Mother    Psoriasis Mother        psoriatic arthritis    Diabetes Mother    Heart disease Mother    Psoriasis Sister        psoriatic arthritis    Diabetes Sister    Rheum arthritis Sister    Fibromyalgia Sister    Diabetes Sister     Social History   Tobacco Use   Smoking status: Never   Smokeless tobacco: Never  Vaping Use   Vaping Use: Never used  Substance Use Topics   Alcohol use: No   Drug use: Never    Home Medications Prior to Admission medications   Medication Sig Start Date End Date Taking? Authorizing Provider  aspirin EC 81 MG tablet Take 81 mg by mouth daily. Swallow whole.    [provider]  Atogepant (QULIPTA) 60 MG TABS  02/27/20   [provider]  azelastine (OPTIVAR) 0.05 % ophthalmic solution Place 1 drop into both eyes 2 (two) times daily as needed (allergy eyes.).  05/09/19   [provider]  baclofen (LIORESAL) 10 MG tablet TAKE 1 TABLET EVERY MORNING AND 1 TABLET AT NOON. 07/02/20   Gearldine Bienenstock, PA-C  butalbital-acetaminophen-caffeine (FIORICET, ESGIC) [phone] MG tablet Take 1-2 tablets by mouth 2 (two) times daily as needed for migraine.  08/31/16   [provider]  cholecalciferol (VITAMIN D) 25 MCG (1000 UNIT) tablet Take 1,000 Units by mouth daily.    [provider]  diclofenac Sodium (VOLTAREN) 1 % GEL APPLY 2 TO 4 GRAMS TO AFFECTED JOINTS UP TO 4 TIMES DAILY AS NEEDED. Patient taking  differently: Apply 2 g topically 4 (four) times daily as needed (joint pain). 09/20/19   Pollyann Savoy, MD  DULoxetine (CYMBALTA) 60 MG capsule Take 120 mg by mouth daily.    [provider]  eletriptan (RELPAX) 40 MG tablet Take 40 mg by mouth 2 (two) times daily as needed for migraine. 08/10/19   [provider]  ferrous sulfate 325 (65 FE) MG tablet Take 325 mg by mouth daily with breakfast.    [provider]  fluticasone (FLONASE) 50 MCG/ACT nasal spray Place 1 spray into both nostrils daily.    [provider]  hydroxychloroquine (PLAQUENIL) 200 MG tablet TAKE 1 TABLET  BY MOUTH TWICE DAILY MONDAY THROUGH FRIDAY 07/30/20   Gearldine Bienenstock, PA-C  levocetirizine (XYZAL) 5 MG tablet Take 5 mg by mouth at bedtime. 07/11/19   [provider]  lithium carbonate (ESKALITH) 450 MG CR tablet  02/20/20   [provider]  MAGNESIUM PO Take 500 mg by mouth daily.     [provider]  Multiple Vitamin (MULTIVITAMIN WITH MINERALS) TABS tablet Take 1 tablet by mouth daily.    [provider]  ondansetron (ZOFRAN-ODT) 8 MG disintegrating tablet Take 8 mg by mouth every 8 (eight) hours as needed for nausea.     [provider]  tiZANidine (ZANAFLEX) 4 MG tablet TAKE 1 TABLET AT BEDTIME AS NEEDED. 09/08/20   Gearldine Bienenstock, PA-C  TURMERIC-GINGER PO Take 1 tablet by mouth daily.    [provider]    Allergies    Sulfa antibiotics, Codeine, Erythromycin, Metronidazole, and Morphine and related  Review of Systems   Review of Systems  Constitutional:  Negative for chills, diaphoresis and fever.  Respiratory:  Negative for shortness of breath.   Cardiovascular:  Negative for chest pain.  Gastrointestinal:  Negative for abdominal pain, diarrhea, nausea and vomiting.  Genitourinary:  Negative for dysuria and hematuria.  Musculoskeletal:  Negative for back pain and neck pain.  Neurological:  Positive for light-headedness and  headaches. Negative for seizures, speech difficulty, weakness and numbness.  All other systems reviewed and are negative.  Physical Exam Updated Vital Signs BP 109/62   Pulse 77   Temp 98 F (36.7 C) (Oral)   Resp (!) 24   Ht 5\' 4"  (1.626 m)   Wt 79.8 kg   SpO2 100%   BMI 30.21 kg/m   Physical Exam Vitals and nursing note reviewed.  Constitutional:      General: She is not in acute distress.    Appearance: She is well-developed. She is not diaphoretic.  HENT:     Head: Normocephalic and atraumatic.     Mouth/Throat:     Mouth: Mucous membranes are moist.     Pharynx: Oropharynx is clear.  Eyes:     Conjunctiva/sclera: Conjunctivae normal.  Cardiovascular:     Rate and Rhythm: Normal rate and regular rhythm.     Pulses: Normal pulses.          Radial pulses are 2+ on the right side and 2+ on the left side.       Posterior tibial pulses are 2+ on the right side and 2+ on the left side.     Heart sounds: Normal heart sounds.     Comments: Tactile temperature in the extremities appropriate and equal bilaterally. Pulmonary:     Effort: Pulmonary effort is normal. No respiratory distress.     Breath sounds: Normal breath sounds.  Abdominal:     Palpations: Abdomen is soft.     Tenderness: There is no abdominal tenderness. There is no guarding.  Musculoskeletal:     Cervical back: Normal range of motion and neck supple. No tenderness.     Right lower leg: No edema.     Left lower leg: No edema.     Comments: Normal motor function intact in all extremities. No midline spinal tenderness. The patient's extremities were examined, palpated, and the joints were ranged without evidence of tenderness, swelling, deformity, instability, or pain with range of motion of the joints.  Lymphadenopathy:     Cervical: No cervical adenopathy.  Skin:    General: Skin  is warm and dry.     Comments: Confluent erythema without noted in visualized lesions to the upper chest and neck.  It is  actually noted to be in the exact same pattern as exposed skin not covered by her shirt.  No noted pustules or vesicles.  Neurological:     Mental Status: She is alert and oriented to person, place, and time.     Comments: No noted acute cognitive deficit. Sensation grossly intact to light touch in the extremities.   Grip strengths equal bilaterally.   Strength 5/5 in all extremities.  No gait disturbance.  Coordination intact.  Cranial nerves III-XII grossly intact.  Handles oral secretions without noted difficulty.  No noted phonation or speech deficit. No facial droop.   Psychiatric:        Mood and Affect: Mood and affect normal.        Speech: Speech normal.        Behavior: Behavior normal.    ED Results / Procedures / Treatments   Labs (all labs ordered are listed, but only abnormal results are displayed) Labs Reviewed  BASIC METABOLIC PANEL - Abnormal; Notable for the following components:      Result Value   Glucose, Bld 117 (*)    All other components within normal limits  URINALYSIS, ROUTINE W REFLEX MICROSCOPIC - Abnormal; Notable for the following components:   Hgb urine dipstick SMALL (*)    Leukocytes,Ua TRACE (*)    All other components within normal limits  CBG MONITORING, ED - Abnormal; Notable for the following components:   Glucose-Capillary 111 (*)    All other components within normal limits  CBC    EKG None  ED ECG REPORT   Date: 09/11/2020  Rate: 74  Rhythm: normal sinus rhythm  QRS Axis: normal  Intervals: normal  ST/T Wave abnormalities: normal  Conduction Disutrbances:none  Narrative Interpretation:   Old EKG Reviewed:  Reviewed.  Previous rate slightly slower.  Otherwise no notable differences.  I have personally reviewed the EKG tracing and agree with the computerized printout as noted.  Radiology CT Head Wo Contrast  Result Date: 09/11/2020 CLINICAL DATA:  Migraine headache for 4 days, history of fall EXAM: CT HEAD WITHOUT CONTRAST  TECHNIQUE: Contiguous axial images were obtained from the base of the skull through the vertex without intravenous contrast. COMPARISON:  06/03/2009 FINDINGS: Brain: No acute infarct or hemorrhage. Lateral ventricles and midline structures are unremarkable. No acute extra-axial fluid collections. No mass effect. Vascular: No hyperdense vessel or unexpected calcification. Skull: Normal. Negative for fracture or focal lesion. Sinuses/Orbits: Postsurgical changes within the maxillary sinuses and ethmoid air cells. No acute sinus disease. Other: None. IMPRESSION: 1. No acute intracranial process. Electronically Signed   By: Sharlet Salina M.D.   On: 09/11/2020 16:12    Procedures Procedures   Medications Ordered in ED Medications  ketorolac (TORADOL) 15 MG/ML injection 15 mg (15 mg Intravenous Given 09/11/20 1530)  metoCLOPramide (REGLAN) injection 10 mg (10 mg Intravenous Given 09/11/20 1531)  dexamethasone (DECADRON) injection 10 mg (10 mg Intravenous Given 09/11/20 1528)  sodium chloride 0.9 % bolus 1,000 mL (1,000 mLs Intravenous New Bag/Given 09/11/20 1526)    ED Course  I have reviewed the triage vital signs and the nursing notes.  Pertinent labs & imaging results that were available during my care of the patient were reviewed by me and considered in my medical decision making (see chart for details).  Clinical Course as of 09/11/20 1754  Thu Sep 11, 2020  1648 Patient states she feels much better. [SJ]    Clinical Course User Index [SJ] Martavis Gurney, Hillard Danker, PA-C   MDM Rules/Calculators/A&P                           Patient presents with complaint of headache consistent with previous migraines.  Also endorses a couple episodes of near syncope over the last several days. Patient is nontoxic appearing, afebrile, not tachycardic, not tachypneic, not hypotensive, maintains excellent SPO2 on room air, and is in no apparent distress.   I have reviewed the patient's chart to obtain more information.    I reviewed and interpreted the patient's labs and radiological studies. Work-up overall reassuring.  Headache and skin redness improved. She has follow-up already scheduled with her neurologist next week. Ambulated throughout ED course and prior to discharge independently and without symptom onset. The patient was given instructions for home care as well as return precautions. Patient voices understanding of these instructions, accepts the plan, and is comfortable with discharge.   Vitals:   09/11/20 1410 09/11/20 1415 09/11/20 1500 09/11/20 1530  BP:  109/62 127/82 121/71  Pulse:  77 73 72  Resp:  (!) 24 16 19   Temp:      TempSrc:      SpO2:  100% 97% 100%  Weight: 79.8 kg     Height: 5\' 4"  (1.626 m)        Final Clinical Impression(s) / ED Diagnoses Final diagnoses:  Left-sided headache  Near syncope    Rx / DC Orders ED Discharge Orders     None        09/11/20 1756    Concepcion Living, MD 09/15/20 1504

## 2020-09-11 NOTE — Discharge Instructions (Signed)
  Work-up today was overall reassuring.  Please follow-up with the neurologist, as planned. Follow-up with your primary care provider as well.  Return to the emergency department for seizures, chest pain, shortness of breath, numbness, weakness, or any other major concerns.

## 2020-10-03 ENCOUNTER — Other Ambulatory Visit: Payer: Self-pay | Admitting: Physician Assistant

## 2020-10-03 DIAGNOSIS — M0609 Rheumatoid arthritis without rheumatoid factor, multiple sites: Secondary | ICD-10-CM

## 2020-10-03 NOTE — Telephone Encounter (Signed)
Next Visit: 12/03/2020  Last Visit: 07/01/2020  Labs: 09/11/2020 Glucose 117  Eye exam: 07/31/2019   Current Dose per office note 07/01/2020: tizanidine 4 mg at bedtime as needed for muscle spasms. Plaquenil 200 mg 1 tablet by mouth twice daily Monday through Friday baclofen 10 mg twice daily as needed during the day  DX: Fibromyalgia, Rheumatoid arthritis of multiple sites with negative rheumatoid factor   Last Fill: Baclofen 07/02/2020, PLQ 07/30/2020, Tizanidine 09/08/2020  Left message to advise patient she is due to update her PLQ eye exam.   Okay to refill Plaquenil?

## 2020-10-08 ENCOUNTER — Telehealth: Payer: Self-pay | Admitting: Rheumatology

## 2020-10-08 DIAGNOSIS — M0609 Rheumatoid arthritis without rheumatoid factor, multiple sites: Secondary | ICD-10-CM

## 2020-10-08 NOTE — Telephone Encounter (Signed)
Patient advised we have placed referral placed to [facility] including all requested information.

## 2020-10-08 NOTE — Telephone Encounter (Signed)
Patient calling to let you know she has been scheduled for December with [facility] in [place] for her Plaquenil Eye Exam. The eye doctor office is requiring a order from Dr. Corliss Skains stating patient needs that type of exam, that she takes Plaquenil, and her diagnosis before they will do the exam. Patient did not have their phone number on hand. Please call patient with questions.

## 2020-10-20 NOTE — Progress Notes (Signed)
Office Visit Note  Patient: Christina Lewis             Date of Birth: July 24, 1955           MRN: [id number]             PCP: Kirstie Peri, MD Referring: Kirstie Peri, MD Visit Date: 10/21/2020 Occupation: @  Subjective:  Right trochanteric bursitis   History of Present Illness: Christina Lewis is a 65 y.o. female with history of seronegative rheumatoid arthritis, osteoarthritis, and DDD.  She is taking plaquenil 200 mg 1 tablet by mouth twice daily Monday through Friday.  She continues to tolerate Plaquenil without any side effects.  She continues to have intermittent pain and stiffness in both hands and both feet.  She has ongoing discomfort in her right great toe.  She states that her right knee joint pain improved after having a cortisone injection performed on 07/01/2020.  She presents today with increased discomfort due to trochanter bursitis of the right hip.  She had a cortisone injection performed on 07/12/2019 which provided significant pain relief but her discomfort has returned.  She would like a cortisone injection performed today.  She has tried using ice, Voltaren gel, and taking Tylenol as needed for pain relief. She states that she was diagnosed with COVID-19 at the end of June 2022.  She continues to have significant fatigue on a daily basis.  She has not been sleeping well at night due to chronic insomnia.  She continues to have generalized myalgias and muscle tenderness due to fibromyalgia.    Activities of Daily Living:  Patient reports morning stiffness for all day. Patient Reports nocturnal pain.  Difficulty dressing/grooming: Denies Difficulty climbing stairs: Reports Difficulty getting out of chair: Reports Difficulty using hands for taps, buttons, cutlery, and/or writing: Reports  Review of Systems  Constitutional:  Positive for fatigue.  HENT:  Positive for mouth sores and mouth dryness. Negative for nose dryness.   Eyes:  Positive for redness and  dryness. Negative for pain, itching and visual disturbance.  Respiratory:  Negative for cough, hemoptysis, shortness of breath and difficulty breathing.   Cardiovascular:  Negative for chest pain, palpitations, hypertension and swelling in legs/feet.  Gastrointestinal:  Negative for blood in stool, constipation and diarrhea.  Endocrine: Negative for increased urination.  Genitourinary:  Negative for difficulty urinating and painful urination.  Musculoskeletal:  Positive for joint pain, joint pain, joint swelling, myalgias, morning stiffness, muscle tenderness and myalgias. Negative for muscle weakness.  Skin:  Positive for color change and rash (intermittent rash around neck). Negative for pallor, hair loss, nodules/bumps, skin tightness, ulcers and sensitivity to sunlight.  Allergic/Immunologic: Negative for susceptible to infections.  Neurological:  Positive for dizziness, numbness and headaches. Negative for memory loss and weakness.  Hematological:  Negative for bruising/bleeding tendency and swollen glands.  Psychiatric/Behavioral:  Negative for depressed mood, confusion and sleep disturbance. The patient is not nervous/anxious.    PMFS History:  Patient Active Problem List   Diagnosis Date Noted   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 myelopathy 09/11/2019   Leukocytosis 11/04/2016   Fibromyalgia 02/10/2016   Other fatigue 02/10/2016   Primary osteoarthritis of both hands 02/10/2016   History of migraine 02/10/2016   History of thyroid nodule 02/10/2016   History of sleep apnea 02/10/2016   History of renal calculi 02/10/2016   Pain in joint, shoulder region 10/02/2010   Adhesive capsulitis of shoulder 10/02/2010   Muscle weakness (generalized) 10/02/2010    Past  Medical History:  Diagnosis Date   Arthritis    Asthma 2007   Depression    Diabetes mellitus    Fibromyalgia    GERD (gastroesophageal reflux disease)    Gout    Hematuria    History of kidney stones    History of tics     Interstitial cystitis 2016   Migraines    Osteoarthritis    PCOS (polycystic ovarian syndrome) 1993   Pneumonia 2019   PONV (postoperative nausea and vomiting)    Sleep apnea    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, took medication for a year   Thyroid nodule     Family History  Problem Relation Age of Onset   Fibromyalgia Mother    Psoriasis Mother        psoriatic arthritis    Diabetes Mother    Heart disease Mother    Psoriasis Sister        psoriatic arthritis    Diabetes Sister    Rheum arthritis Sister    Fibromyalgia Sister    Diabetes Sister    Past Surgical History:  Procedure Laterality Date   ABDOMINAL HYSTERECTOMY  2000   APPENDECTOMY  1976   BREAST LUMPECTOMY  1989   lt-negative   CARDIOVASCULAR STRESS TEST  2000   CATARACT EXTRACTION W/PHACO Left 12/07/2019   Procedure: CATARACT EXTRACTION PHACO AND INTRAOCULAR LENS PLACEMENT (IOC);  Surgeon: [name], MD;  Location: [facility];  Service: Ophthalmology;  Laterality: Left;  CDE: 5.55   CHOLECYSTECTOMY  1985   COLONOSCOPY     HYSTERECTOMY ABDOMINAL WITH SALPINGECTOMY Bilateral 2000   KIDNEY STONE SURGERY Right 2014   with stent placement in OR   KNEE ARTHROSCOPY Right 08/22/2013   Procedure: ARTHROSCOPY RIGHT KNEE FOR INFECTION LAVAGE AND DRAINAGE;  Surgeon: Thera Flake., MD;  Location: [facility];  Service: Orthopedics;  Laterality: Right;   KNEE ARTHROSCOPY WITH PATELLA RECONSTRUCTION Right 08/06/2013   Procedure: RIGHT KNEE ARTHROSCOPY WITH MENISCECTOMY MEDIAL, ARTHROSCOPY KNEE WITH DEBRIDEMENT/SHAVING (CHONDROPLASTY) ;  Surgeon: Thera Flake., MD;  Location: [facility];  Service: Orthopedics;  Laterality: Right;   LUMBAR LAMINECTOMY/DECOMPRESSION MICRODISCECTOMY Right 09/11/2019   Procedure: Right Lumbar Three-Four Microdiscectomy;  Surgeon: Maeola Harman, MD;  Location: [facility];  Service: Neurosurgery;  Laterality: Right;  Right Lumbar Three-Four  Microdiscectomy   NASAL SINUS SURGERY     x4   OOPHORECTOMY     SHOULDER SURGERY  2011   right shoulder   THYROID SURGERY  1994   TONSILLECTOMY Bilateral 1974   Social History   Social History Narrative   Not on file    There is no immunization history on file for this patient.   Objective: Vital Signs: BP (!) 138/91 (BP Location: Left Arm, Patient Position: Sitting, Cuff Size: Normal)   Pulse 87   Ht 5\' 4"  (1.626 m)   Wt 173 lb (78.5 kg)   BMI 29.70 kg/m    Physical Exam Vitals and nursing note reviewed.  Constitutional:      Appearance: She is well-developed.  HENT:     Head: Normocephalic and atraumatic.  Eyes:     Conjunctiva/sclera: Conjunctivae normal.  Pulmonary:     Effort: Pulmonary effort is normal.  Abdominal:     Palpations: Abdomen is soft.  Musculoskeletal:     Cervical back: Normal range of motion.  Skin:    General: Skin is warm and dry.  Capillary Refill: Capillary refill takes less than 2 seconds.  Neurological:     Mental Status: She is alert and oriented to person, place, and time.  Psychiatric:        Behavior: Behavior normal.     Musculoskeletal Exam: Generalized hyperalgesia and positive tender points on exam.  C-spine, thoracic spine, lumbar spine have good range of motion with no discomfort.  Trapezius muscle tension and tenderness bilaterally.  Shoulder joints, elbow joints, wrist joints, MCPs, PIPs, DIPs have good range of motion with no synovitis.  Complete fist formation bilaterally.  PIP and DIP thickening consistent with osteoarthritis of both hands noted.  Tenderness over the left CMC joint.  Hip joints have good range of motion.  Tenderness over the right trochanteric bursa.  Knee joints have good range of motion with no warmth or effusion.  Ankle joints have good range of motion with no tenderness or joint swelling.  Tenderness over the right first MTP joint noted.  Hammertoes noted bilaterally.  CDAI Exam: CDAI Score:  -- Patient Global: --; Provider Global: -- Swollen: --; Tender: -- Joint Exam 10/21/2020   No joint exam has been documented for this visit   There is currently no information documented on the homunculus. Go to the Rheumatology activity and complete the homunculus joint exam.  Investigation: No additional findings.  Imaging: No results found.  Recent Labs: Lab Results  Component Value Date   WBC 10.4 09/11/2020   HGB 13.4 09/11/2020   PLT 367 09/11/2020   NA 138 09/11/2020   K 3.9 09/11/2020   CL 103 09/11/2020   CO2 27 09/11/2020   GLUCOSE 117 (H) 09/11/2020   BUN 15 09/11/2020   CREATININE 0.79 09/11/2020   BILITOT 0.3 03/24/2020   ALKPHOS 59 11/04/2016   AST 17 03/24/2020   ALT 20 03/24/2020   PROT 6.7 03/24/2020   ALBUMIN 4.0 11/04/2016   CALCIUM 9.4 09/11/2020   GFRAA 98 03/24/2020    Speciality Comments: PLQ Eye Exam: 07/31/2019 @ [facility]   PLQ eye exam scheduled for 02/12/2021.   Procedures:  Large Joint Inj: R greater trochanter on 10/21/2020 2:11 PM Indications: pain Details: 27 G 1.5 in lateral  Arthrogram: No  Medications: 1 mL lidocaine 1 %; 40 mg triamcinolone acetonide 40 MG/ML Aspirate: 0 mL Outcome: tolerated well, no immediate complications   Allergies: Sulfa antibiotics, Codeine, Erythromycin, Metronidazole, and Morphine and related   Assessment / Plan:     Visit Diagnoses: Rheumatoid arthritis of multiple sites with negative rheumatoid factor (HCC): She has no synovitis on examination today.  She continues to have intermittent pain and stiffness in both hands and both feet due to underlying osteoarthritis.  We discussed the importance of joint protection and muscle strengthening.  Overall her rheumatoid arthritis seems well controlled taking Plaquenil 200 mg 1 tablet by mouth twice daily Monday through Friday.  She continues to tolerate Plaquenil without any side effects.  She will remain on the current treatment regimen.   She was advised to notify us if she develops increased joint pain or joint swelling.  She will follow-up in the office in 5 months.  High risk medication use - Plaquenil 200 mg 1 tablet by mouth twice daily Monday through Friday. CBC and BMP updated on 09/11/20.  She has an upcoming physical with her PCP and will be having updated lab work at that time.  She was advised to have CBC with differential and CMP with GFR drawn at that time  and and to have results faxed to our office.   She is due to update PLQ eye exam. Scheduled for 02/12/21.   Primary osteoarthritis of both hands: She has PIP and DIP thickening consistent with osteoarthritis of both hands.  Tenderness over the left [facility] joint noted.  She was able to make a complete fist bilaterally.  No synovitis was noted on examination today.  Discussed the importance of joint protection and muscle strengthening.  She plans on continuing to use Voltaren gel topically as needed for pain relief.  Primary osteoarthritis of right knee: She has good range of motion of the right knee joint with no discomfort.  No warmth or effusion was noted.  She had a right knee joint cortisone injection performed on 07/01/2020 which provided significant relief.  Primary osteoarthritis of both feet: She continues to have chronic pain in both feet due to underlying osteoarthritis.  Hammertoes noted bilaterally.  Some tenderness over the right first MTP joint noted.  Both ankle joints have good range of motion with no tenderness or synovitis.  We discussed the importance of joint protection and proper fitting shoes.  Fibromyalgia - She has generalized hyperalgesia and positive tender points on examination.  She has trapezius muscle tension and tenderness bilaterally.  She takes baclofen 10 mg twice daily as needed during the day and tizanidine 4 mg at bedtime as needed for muscle spasms.  She has been experiencing increased fatigue on a daily basis secondary to insomnia.  She was  diagnosed with COVID-19 at the end of June 2022 and has had significant fatigue since then.  We discussed the importance of regular exercise and good sleep hygiene.  She plans on following up with her neurologist to discuss her worsening insomnia.  Trochanteric bursitis of right hip - She presents today with discomfort on the lateral aspect of her right hip.  She has tenderness to palpation over the right trochanteric bursa.  She previously had a cortisone injection performed on 07/12/2019 which provided significant pain relief.  She has been having increased discomfort over the past 2 weeks.  She requested a cortisone injection today.  She tolerated the procedure well.  Procedure note was completed above.  Aftercare was discussed.  We discussed that once her pain has completely resolved she can start performing stretching exercises on a daily basis to prevent further flares.  She was given a handout of these exercises to perform.  Plan: Large Joint Inj: R greater trochanter  Other fatigue: She continues to experience fatigue on a daily basis.  She has been having increased difficulty sleeping at night due to chronic insomnia.  She plans on following up with her neurologist to discuss her worsening insomnia.  We discussed the importance of regular exercise and good sleep hygiene.  Chronic SI joint pain: No SI joint tenderness to palpation on examination today.  DDD (degenerative disc disease), lumbar: She continues to experience occasional pain and stiffness in her lower back.  She has no symptoms of radiculopathy at this time.  Other medical conditions are listed as follows:  History of tics  History of diabetes mellitus  History of thyroid nodule  History of sleep apnea  History of renal calculi  History of gastroesophageal reflux (GERD)  History of positive PPD  History of migraine  History of PCOS  History of depression  Orders: Orders Placed This Encounter  Procedures   Large  Joint Inj: R greater trochanter    No orders of the defined types were  placed in this encounter.    Follow-Up Instructions: Return in about 5 months (around 03/23/2021) for Rheumatoid arthritis, Osteoarthritis.   Gearldine Bienenstock, PA-C  Note - This record has been created using Dragon software.  Chart creation errors have been sought, but may not always  have been located. Such creation errors do not reflect on  the standard of medical care.

## 2020-10-21 ENCOUNTER — Encounter: Payer: Self-pay | Admitting: Physician Assistant

## 2020-10-21 ENCOUNTER — Ambulatory Visit (INDEPENDENT_AMBULATORY_CARE_PROVIDER_SITE_OTHER): Payer: Medicare Other | Admitting: Physician Assistant

## 2020-10-21 ENCOUNTER — Other Ambulatory Visit: Payer: Self-pay

## 2020-10-21 VITALS — BP 138/91 | HR 87 | Ht 64.0 in | Wt 173.0 lb

## 2020-10-21 DIAGNOSIS — M19071 Primary osteoarthritis, right ankle and foot: Secondary | ICD-10-CM

## 2020-10-21 DIAGNOSIS — M5136 Other intervertebral disc degeneration, lumbar region: Secondary | ICD-10-CM | POA: Diagnosis not present

## 2020-10-21 DIAGNOSIS — M0609 Rheumatoid arthritis without rheumatoid factor, multiple sites: Secondary | ICD-10-CM

## 2020-10-21 DIAGNOSIS — Z8639 Personal history of other endocrine, nutritional and metabolic disease: Secondary | ICD-10-CM | POA: Diagnosis not present

## 2020-10-21 DIAGNOSIS — Z87442 Personal history of urinary calculi: Secondary | ICD-10-CM

## 2020-10-21 DIAGNOSIS — Z8659 Personal history of other mental and behavioral disorders: Secondary | ICD-10-CM | POA: Diagnosis not present

## 2020-10-21 DIAGNOSIS — M533 Sacrococcygeal disorders, not elsewhere classified: Secondary | ICD-10-CM

## 2020-10-21 DIAGNOSIS — R5383 Other fatigue: Secondary | ICD-10-CM | POA: Diagnosis not present

## 2020-10-21 DIAGNOSIS — G8929 Other chronic pain: Secondary | ICD-10-CM

## 2020-10-21 DIAGNOSIS — Z8669 Personal history of other diseases of the nervous system and sense organs: Secondary | ICD-10-CM

## 2020-10-21 DIAGNOSIS — M19041 Primary osteoarthritis, right hand: Secondary | ICD-10-CM

## 2020-10-21 DIAGNOSIS — Z8742 Personal history of other diseases of the female genital tract: Secondary | ICD-10-CM

## 2020-10-21 DIAGNOSIS — Z9289 Personal history of other medical treatment: Secondary | ICD-10-CM

## 2020-10-21 DIAGNOSIS — M1711 Unilateral primary osteoarthritis, right knee: Secondary | ICD-10-CM | POA: Diagnosis not present

## 2020-10-21 DIAGNOSIS — M19042 Primary osteoarthritis, left hand: Secondary | ICD-10-CM

## 2020-10-21 DIAGNOSIS — M797 Fibromyalgia: Secondary | ICD-10-CM | POA: Diagnosis not present

## 2020-10-21 DIAGNOSIS — M51369 Other intervertebral disc degeneration, lumbar region without mention of lumbar back pain or lower extremity pain: Secondary | ICD-10-CM

## 2020-10-21 DIAGNOSIS — M7061 Trochanteric bursitis, right hip: Secondary | ICD-10-CM | POA: Diagnosis not present

## 2020-10-21 DIAGNOSIS — M19072 Primary osteoarthritis, left ankle and foot: Secondary | ICD-10-CM

## 2020-10-21 DIAGNOSIS — Z79899 Other long term (current) drug therapy: Secondary | ICD-10-CM | POA: Diagnosis not present

## 2020-10-21 DIAGNOSIS — Z8719 Personal history of other diseases of the digestive system: Secondary | ICD-10-CM

## 2020-10-21 MED ORDER — TRIAMCINOLONE ACETONIDE 40 MG/ML IJ SUSP
40.0000 mg | INTRAMUSCULAR | Status: AC | PRN
  Administered 2020-10-21: 40 mg via INTRA_ARTICULAR

## 2020-10-21 MED ORDER — LIDOCAINE HCL 1 % IJ SOLN
1.0000 mL | INTRAMUSCULAR | Status: AC | PRN
  Administered 2020-10-21: 1 mL

## 2020-10-21 NOTE — Patient Instructions (Signed)
CBC with diff and CMP with GFR due every 5 months    Hip Bursitis Rehab Ask your health care provider which exercises are safe for you. Do exercises exactly as told by your health care provider and adjust them as directed. It is normal to feel mild stretching, pulling, tightness, or discomfort as you do these exercises. Stop right away if you feel sudden pain or your pain gets worse. Do not begin these exercises until told by your health care provider. Stretching exercise This exercise warms up your muscles and joints and improves the movement andflexibility of your hip. This exercise also helps to relieve pain and stiffness. Iliotibial band stretch An iliotibial band is a strong band of muscle tissue that runs from the outer side of your hip to the outer side of your thigh and knee. Lie on your side with your left / right leg in the top position. Bend your left / right knee and grab your ankle. Stretch out your bottom arm to help you balance. Slowly bring your knee back so your thigh is behind your body. Slowly lower your knee toward the floor until you feel a gentle stretch on the outside of your left / right thigh. If you do not feel a stretch and your knee will not fall farther, place the heel of your other foot on top of your knee and pull your knee down toward the floor with your foot. Hold this position for __________ seconds. Slowly return to the starting position. Repeat __________ times. Complete this exercise __________ times a day. Strengthening exercises These exercises build strength and endurance in your hip and pelvis. Enduranceis the ability to use your muscles for a long time, even after they get tired. Bridge This exercise strengthens the muscles that move your thigh backward (hip extensors). Lie on your back on a firm surface with your knees bent and your feet flat on the floor. Tighten your buttocks muscles and lift your buttocks off the floor until your trunk is level with  your thighs. Do not arch your back. You should feel the muscles working in your buttocks and the back of your thighs. If you do not feel these muscles, slide your feet 1-2 inches (2.5-5 cm) farther away from your buttocks. If this exercise is too easy, try doing it with your arms crossed over your chest. Hold this position for __________ seconds. Slowly lower your hips to the starting position. Let your muscles relax completely after each repetition. Repeat __________ times. Complete this exercise __________ times a day. Squats This exercise strengthens the muscles in front of your thigh and knee (quadriceps). Stand in front of a table, with your feet and knees pointing straight ahead. You may rest your hands on the table for balance but not for support. Slowly bend your knees and lower your hips like you are going to sit in a chair. Keep your weight over your heels, not over your toes. Keep your lower legs upright so they are parallel with the table legs. Do not let your hips go lower than your knees. Do not bend lower than told by your health care provider. If your hip pain increases, do not bend as low. Hold the squat position for __________ seconds. Slowly push with your legs to return to standing. Do not use your hands to pull yourself to standing. Repeat __________ times. Complete this exercise __________ times a day. Hip hike Stand sideways on a bottom step. Stand on your left / right leg  with your other foot unsupported next to the step. You can hold on to the railing or wall for balance if needed. Keep your knees straight and your torso square. Then lift your left / right hip up toward the ceiling. Hold this position for __________ seconds. Slowly let your left / right hip lower toward the floor, past the starting position. Your foot should get closer to the floor. Do not lean or bend your knees. Repeat __________ times. Complete this exercise __________ times a day. Single leg  stand Without shoes, stand near a railing or in a doorway. You may hold on to the railing or door frame as needed for balance. Squeeze your left / right buttock muscles, then lift up your other foot. Do not let your left / right hip push out to the side. It is helpful to stand in front of a mirror for this exercise so you can watch your hip. Hold this position for __________ seconds. Repeat __________ times. Complete this exercise __________ times a day. This information is not intended to replace advice given to you by your health care provider. Make sure you discuss any questions you have with your healthcare provider. Document Revised: 06/05/2018 Document Reviewed: 06/05/2018 Elsevier Patient Education  2022 [employer]  Iliotibial Band Syndrome Rehab Ask your health care provider which exercises are safe for you. Do exercises exactly as told by your health care provider and adjust them as directed. It is normal to feel mild stretching, pulling, tightness, or discomfort as you do these exercises. Stop right away if you feel sudden pain or your pain gets significantly worse. Do not begin these exercises until told by your health care provider. Stretching and range-of-motion exercises These exercises warm up your muscles and joints and improve the movement andflexibility of your hip and pelvis. Quadriceps stretch, prone  Lie on your abdomen (prone position) on a firm surface, such as a bed or padded floor. Bend your left / right knee and reach back to hold your ankle or pant leg. If you cannot reach your ankle or pant leg, loop a belt around your foot and grab the belt instead. Gently pull your heel toward your buttocks. Your knee should not slide out to the side. You should feel a stretch in the front of your thigh and knee (quadriceps). Hold this position for __________ seconds. Repeat __________ times. Complete this exercise __________ times a day. Iliotibial band stretch An iliotibial  band is a strong band of muscle tissue that runs from the outer side of your hip to the outer side of your thigh and knee. Lie on your side with your left / right leg in the top position. Bend both of your knees and grab your left / right ankle. Stretch out your bottom arm to help you balance. Slowly bring your top knee back so your thigh goes behind your trunk. Slowly lower your top leg toward the floor until you feel a gentle stretch on the outside of your left / right hip and thigh. If you do not feel a stretch and your knee will not fall farther, place the heel of your other foot on top of your knee and pull your knee down toward the floor with your foot. Hold this position for __________ seconds. Repeat __________ times. Complete this exercise __________ times a day. Strengthening exercises These exercises build strength and endurance in your hip and pelvis. Enduranceis the ability to use your muscles for a long time, even after they  get tired. Straight leg raises, side-lying This exercise strengthens the muscles that rotate the leg at the hip and move it away from your body (hip abductors). Lie on your side with your left / right leg in the top position. Lie so your head, shoulder, hip, and knee line up. You may bend your bottom knee to help you balance. Roll your hips slightly forward so your hips are stacked directly over each other and your left / right knee is facing forward. Tense the muscles in your outer thigh and lift your top leg 4-6 inches (10-15 cm). Hold this position for __________ seconds. Slowly lower your leg to return to the starting position. Let your muscles relax completely before doing another repetition. Repeat __________ times. Complete this exercise __________ times a day. Leg raises, prone This exercise strengthens the muscles that move the hips backward (hip extensors). Lie on your abdomen (prone position) on your bed or a firm surface. You can put a pillow under  your hips if that is more comfortable for your lower back. Bend your left / right knee so your foot is straight up in the air. Squeeze your buttocks muscles and lift your left / right thigh off the bed. Do not let your back arch. Tense your thigh muscle as hard as you can without increasing any knee pain. Hold this position for __________ seconds. Slowly lower your leg to return to the starting position and allow it to relax completely. Repeat __________ times. Complete this exercise __________ times a day. Hip hike Stand sideways on a bottom step. Stand on your left / right leg with your other foot unsupported next to the step. You can hold on to a railing or wall for balance if needed. Keep your knees straight and your torso square. Then lift your left / right hip up toward the ceiling. Slowly let your left / right hip lower toward the floor, past the starting position. Your foot should get closer to the floor. Do not lean or bend your knees. Repeat __________ times. Complete this exercise __________ times a day. This information is not intended to replace advice given to you by your health care provider. Make sure you discuss any questions you have with your healthcare provider. Document Revised: 04/18/2019 Document Reviewed: 04/18/2019 Elsevier Patient Education  2022 [employer].

## 2020-10-29 DIAGNOSIS — G43719 Chronic migraine without aura, intractable, without status migrainosus: Secondary | ICD-10-CM | POA: Diagnosis not present

## 2020-10-31 ENCOUNTER — Other Ambulatory Visit: Payer: Self-pay | Admitting: Rheumatology

## 2020-10-31 NOTE — Telephone Encounter (Signed)
Next Visit: 03/24/2021  Last Visit: 10/21/2020  Last Fill:10/03/2020  Dx: Fibromyalgia   Current Dose per office note on 10/21/2020: baclofen 10 mg twice daily as needed during the day and tizanidine 4 mg at bedtime as needed for muscle spasms  Okay to refill Baclofen and Tizanidine?

## 2020-11-13 DIAGNOSIS — Z79899 Other long term (current) drug therapy: Secondary | ICD-10-CM | POA: Diagnosis not present

## 2020-11-13 DIAGNOSIS — Z7189 Other specified counseling: Secondary | ICD-10-CM | POA: Diagnosis not present

## 2020-11-13 DIAGNOSIS — R55 Syncope and collapse: Secondary | ICD-10-CM | POA: Diagnosis not present

## 2020-11-13 DIAGNOSIS — Z6829 Body mass index (BMI) 29.0-29.9, adult: Secondary | ICD-10-CM | POA: Diagnosis not present

## 2020-11-13 DIAGNOSIS — I10 Essential (primary) hypertension: Secondary | ICD-10-CM | POA: Diagnosis not present

## 2020-11-13 DIAGNOSIS — Z1211 Encounter for screening for malignant neoplasm of colon: Secondary | ICD-10-CM | POA: Diagnosis not present

## 2020-11-13 DIAGNOSIS — E1165 Type 2 diabetes mellitus with hyperglycemia: Secondary | ICD-10-CM | POA: Diagnosis not present

## 2020-11-13 DIAGNOSIS — E538 Deficiency of other specified B group vitamins: Secondary | ICD-10-CM | POA: Diagnosis not present

## 2020-11-13 DIAGNOSIS — Z1339 Encounter for screening examination for other mental health and behavioral disorders: Secondary | ICD-10-CM | POA: Diagnosis not present

## 2020-11-13 DIAGNOSIS — R5383 Other fatigue: Secondary | ICD-10-CM | POA: Diagnosis not present

## 2020-11-13 DIAGNOSIS — Z299 Encounter for prophylactic measures, unspecified: Secondary | ICD-10-CM | POA: Diagnosis not present

## 2020-11-13 DIAGNOSIS — E559 Vitamin D deficiency, unspecified: Secondary | ICD-10-CM | POA: Diagnosis not present

## 2020-11-13 DIAGNOSIS — Z1331 Encounter for screening for depression: Secondary | ICD-10-CM | POA: Diagnosis not present

## 2020-11-13 DIAGNOSIS — E7800 Pure hypercholesterolemia, unspecified: Secondary | ICD-10-CM | POA: Diagnosis not present

## 2020-11-13 DIAGNOSIS — Z0000 Encounter for general adult medical examination without abnormal findings: Secondary | ICD-10-CM | POA: Diagnosis not present

## 2020-11-13 DIAGNOSIS — Z2821 Immunization not carried out because of patient refusal: Secondary | ICD-10-CM | POA: Diagnosis not present

## 2020-11-17 DIAGNOSIS — R55 Syncope and collapse: Secondary | ICD-10-CM | POA: Diagnosis not present

## 2020-11-19 DIAGNOSIS — G43011 Migraine without aura, intractable, with status migrainosus: Secondary | ICD-10-CM | POA: Diagnosis not present

## 2020-11-19 DIAGNOSIS — U099 Post covid-19 condition, unspecified: Secondary | ICD-10-CM | POA: Diagnosis not present

## 2020-11-19 DIAGNOSIS — R55 Syncope and collapse: Secondary | ICD-10-CM | POA: Diagnosis not present

## 2020-11-23 ENCOUNTER — Telehealth: Payer: Medicare Other | Admitting: Nurse Practitioner

## 2020-11-23 DIAGNOSIS — J069 Acute upper respiratory infection, unspecified: Secondary | ICD-10-CM | POA: Diagnosis not present

## 2020-11-23 MED ORDER — AZELASTINE-FLUTICASONE 137-50 MCG/ACT NA SUSP: 1.0000 | Freq: Two times a day (BID) | NASAL | 0 refills | Status: DC

## 2020-11-23 MED ORDER — BENZONATATE 100 MG PO CAPS: 100.0000 mg | ORAL_CAPSULE | Freq: Two times a day (BID) | ORAL | 0 refills | Status: DC | PRN

## 2020-11-23 NOTE — Progress Notes (Signed)
I have spent 5 minutes in review of e-visit questionnaire, review and updating patient chart, medical decision making and response to patient.  ° °Shan Valdes W Masami Plata, NP ° °  °

## 2020-11-23 NOTE — Progress Notes (Signed)

## 2020-11-24 DIAGNOSIS — R112 Nausea with vomiting, unspecified: Secondary | ICD-10-CM | POA: Diagnosis not present

## 2020-11-24 DIAGNOSIS — Z299 Encounter for prophylactic measures, unspecified: Secondary | ICD-10-CM | POA: Diagnosis not present

## 2020-11-24 DIAGNOSIS — J069 Acute upper respiratory infection, unspecified: Secondary | ICD-10-CM | POA: Diagnosis not present

## 2020-11-24 DIAGNOSIS — J019 Acute sinusitis, unspecified: Secondary | ICD-10-CM | POA: Diagnosis not present

## 2020-11-24 DIAGNOSIS — J309 Allergic rhinitis, unspecified: Secondary | ICD-10-CM | POA: Diagnosis not present

## 2020-12-03 ENCOUNTER — Other Ambulatory Visit: Payer: Self-pay | Admitting: Physician Assistant

## 2020-12-03 ENCOUNTER — Ambulatory Visit: Payer: Medicare Other | Admitting: Rheumatology

## 2020-12-03 NOTE — Telephone Encounter (Signed)
Next Visit: 03/24/2021  Last Visit: 10/21/2020  Last Fill: 11/03/2020  Dx: Fibromyalgia  Current Dose per office note on 10/21/2020: baclofen 10 mg twice daily as needed during the day and tizanidine 4 mg at bedtime as needed for muscle spasms  Okay to refill  Baclofen and Tizanidine?

## 2020-12-05 DIAGNOSIS — I10 Essential (primary) hypertension: Secondary | ICD-10-CM | POA: Diagnosis not present

## 2020-12-05 DIAGNOSIS — R519 Headache, unspecified: Secondary | ICD-10-CM | POA: Diagnosis not present

## 2020-12-05 DIAGNOSIS — E1165 Type 2 diabetes mellitus with hyperglycemia: Secondary | ICD-10-CM | POA: Diagnosis not present

## 2020-12-05 DIAGNOSIS — Z299 Encounter for prophylactic measures, unspecified: Secondary | ICD-10-CM | POA: Diagnosis not present

## 2020-12-05 DIAGNOSIS — Z6829 Body mass index (BMI) 29.0-29.9, adult: Secondary | ICD-10-CM | POA: Diagnosis not present

## 2020-12-09 DIAGNOSIS — R55 Syncope and collapse: Secondary | ICD-10-CM | POA: Diagnosis not present

## 2020-12-09 DIAGNOSIS — U099 Post covid-19 condition, unspecified: Secondary | ICD-10-CM | POA: Diagnosis not present

## 2020-12-09 DIAGNOSIS — G43011 Migraine without aura, intractable, with status migrainosus: Secondary | ICD-10-CM | POA: Diagnosis not present

## 2020-12-10 DIAGNOSIS — M5416 Radiculopathy, lumbar region: Secondary | ICD-10-CM | POA: Diagnosis not present

## 2020-12-12 DIAGNOSIS — E2839 Other primary ovarian failure: Secondary | ICD-10-CM | POA: Diagnosis not present

## 2020-12-22 DIAGNOSIS — G4733 Obstructive sleep apnea (adult) (pediatric): Secondary | ICD-10-CM | POA: Diagnosis not present

## 2020-12-22 DIAGNOSIS — M542 Cervicalgia: Secondary | ICD-10-CM | POA: Diagnosis not present

## 2020-12-22 DIAGNOSIS — R55 Syncope and collapse: Secondary | ICD-10-CM | POA: Diagnosis not present

## 2020-12-22 DIAGNOSIS — G43719 Chronic migraine without aura, intractable, without status migrainosus: Secondary | ICD-10-CM | POA: Diagnosis not present

## 2020-12-25 DIAGNOSIS — M5416 Radiculopathy, lumbar region: Secondary | ICD-10-CM | POA: Diagnosis not present

## 2020-12-25 DIAGNOSIS — M47816 Spondylosis without myelopathy or radiculopathy, lumbar region: Secondary | ICD-10-CM | POA: Diagnosis not present

## 2020-12-31 ENCOUNTER — Encounter: Payer: Self-pay | Admitting: *Deleted

## 2020-12-31 ENCOUNTER — Telehealth: Payer: Self-pay | Admitting: Rheumatology

## 2020-12-31 DIAGNOSIS — M4316 Spondylolisthesis, lumbar region: Secondary | ICD-10-CM | POA: Diagnosis not present

## 2020-12-31 DIAGNOSIS — Z6830 Body mass index (BMI) 30.0-30.9, adult: Secondary | ICD-10-CM | POA: Diagnosis not present

## 2020-12-31 DIAGNOSIS — M5441 Lumbago with sciatica, right side: Secondary | ICD-10-CM | POA: Diagnosis not present

## 2020-12-31 DIAGNOSIS — R030 Elevated blood-pressure reading, without diagnosis of hypertension: Secondary | ICD-10-CM | POA: Diagnosis not present

## 2020-12-31 DIAGNOSIS — G8929 Other chronic pain: Secondary | ICD-10-CM | POA: Diagnosis not present

## 2020-12-31 DIAGNOSIS — M5416 Radiculopathy, lumbar region: Secondary | ICD-10-CM | POA: Diagnosis not present

## 2020-12-31 NOTE — Telephone Encounter (Signed)
Ok to provide letter of exemption due to the patients underlying medical conditions hindering her ability to sit, stand, or walk for prolonged distances or periods of time.

## 2020-12-31 NOTE — Telephone Encounter (Signed)
Patient has received a summons for jury duty, but she does not feel she can sit all day due to her Fibromyalgia and RA. Patient is requesting a note to excuse her. Note is due no later than November 28th. Please call patient to advise.

## 2020-12-31 NOTE — Telephone Encounter (Signed)
Patient advised we have written letter to excuse her from jury duty. Patient advised she may come by the office to pick it up.

## 2021-01-01 ENCOUNTER — Other Ambulatory Visit: Payer: Self-pay | Admitting: Rheumatology

## 2021-01-01 NOTE — Telephone Encounter (Signed)
Next Visit: 03/24/2021   Last Visit: 10/21/2020   Last Fill: 12/03/2020   Dx: Fibromyalgia   Current Dose per office note on 10/21/2020: baclofen 10 mg twice daily as needed during the day and tizanidine 4 mg at bedtime as needed for muscle spasms   Okay to refill  Baclofen and Tizanidine?

## 2021-01-02 ENCOUNTER — Other Ambulatory Visit: Payer: Self-pay | Admitting: Rheumatology

## 2021-01-02 DIAGNOSIS — M0609 Rheumatoid arthritis without rheumatoid factor, multiple sites: Secondary | ICD-10-CM

## 2021-01-02 NOTE — Telephone Encounter (Signed)
Next Visit: 03/24/2021  Last Visit: 10/21/2020  Labs: 09/11/2020 Glucose 117  Eye exam: 07/31/2019 PLQ eye exam scheduled for 02/12/2021.  Current Dose  per office note 10/21/2020: Plaquenil 200 mg 1 tablet by mouth twice daily Monday through Friday.  DX: Rheumatoid arthritis of multiple sites with negative rheumatoid factor   Last Fill: 10/03/2020  Okay to refill Plaquenil?

## 2021-01-05 DIAGNOSIS — M797 Fibromyalgia: Secondary | ICD-10-CM | POA: Diagnosis not present

## 2021-01-05 DIAGNOSIS — Z299 Encounter for prophylactic measures, unspecified: Secondary | ICD-10-CM | POA: Diagnosis not present

## 2021-01-05 DIAGNOSIS — H9202 Otalgia, left ear: Secondary | ICD-10-CM | POA: Diagnosis not present

## 2021-01-05 DIAGNOSIS — E1165 Type 2 diabetes mellitus with hyperglycemia: Secondary | ICD-10-CM | POA: Diagnosis not present

## 2021-01-05 DIAGNOSIS — Z789 Other specified health status: Secondary | ICD-10-CM | POA: Diagnosis not present

## 2021-01-05 DIAGNOSIS — M2550 Pain in unspecified joint: Secondary | ICD-10-CM | POA: Diagnosis not present

## 2021-01-06 ENCOUNTER — Ambulatory Visit: Payer: Medicare Other | Admitting: Podiatry

## 2021-01-12 ENCOUNTER — Ambulatory Visit: Payer: Medicare Other | Admitting: Podiatry

## 2021-01-26 DIAGNOSIS — M5416 Radiculopathy, lumbar region: Secondary | ICD-10-CM | POA: Diagnosis not present

## 2021-01-31 ENCOUNTER — Other Ambulatory Visit: Payer: Self-pay | Admitting: Physician Assistant

## 2021-02-02 NOTE — Telephone Encounter (Signed)
Next Visit: 03/24/2021   Last Visit: 10/21/2020  Last Fill: 01/01/2021   Dx: Fibromyalgia   Current Dose per office note on 10/21/2020: baclofen 10 mg twice daily as needed during the day and tizanidine 4 mg at bedtime as needed for muscle spasms   Okay to refill  Baclofen and Tizanidine?

## 2021-02-12 DIAGNOSIS — Z79899 Other long term (current) drug therapy: Secondary | ICD-10-CM | POA: Diagnosis not present

## 2021-03-03 ENCOUNTER — Other Ambulatory Visit: Payer: Self-pay | Admitting: Rheumatology

## 2021-03-03 NOTE — Telephone Encounter (Signed)
Next Visit: 03/24/2021   Last Visit: 10/21/2020   Last Fill: 02/02/2021   Dx: Fibromyalgia   Current Dose per office note on 10/21/2020: baclofen 10 mg twice daily as needed during the day and tizanidine 4 mg at bedtime as needed for muscle spasms   Okay to refill  Baclofen and Tizanidine?

## 2021-03-04 ENCOUNTER — Inpatient Hospital Stay (HOSPITAL_COMMUNITY)
Admission: EM | Admit: 2021-03-04 | Discharge: 2021-03-06 | DRG: 603 | Disposition: A | Discharge status: Home / Self Care | Payer: Medicare PPO | Attending: Internal Medicine | Admitting: Internal Medicine

## 2021-03-04 ENCOUNTER — Emergency Department (HOSPITAL_COMMUNITY): Payer: Medicare PPO

## 2021-03-04 ENCOUNTER — Other Ambulatory Visit: Payer: Self-pay

## 2021-03-04 ENCOUNTER — Encounter (HOSPITAL_COMMUNITY): Payer: Self-pay | Admitting: Emergency Medicine

## 2021-03-04 DIAGNOSIS — E119 Type 2 diabetes mellitus without complications: Secondary | ICD-10-CM

## 2021-03-04 DIAGNOSIS — W268XXA Contact with other sharp object(s), not elsewhere classified, initial encounter: Secondary | ICD-10-CM

## 2021-03-04 DIAGNOSIS — K219 Gastro-esophageal reflux disease without esophagitis: Secondary | ICD-10-CM | POA: Diagnosis present

## 2021-03-04 DIAGNOSIS — Z79899 Other long term (current) drug therapy: Secondary | ICD-10-CM

## 2021-03-04 DIAGNOSIS — R519 Headache, unspecified: Secondary | ICD-10-CM | POA: Diagnosis present

## 2021-03-04 DIAGNOSIS — L03031 Cellulitis of right toe: Secondary | ICD-10-CM | POA: Diagnosis not present

## 2021-03-04 DIAGNOSIS — S91339A Puncture wound without foreign body, unspecified foot, initial encounter: Secondary | ICD-10-CM | POA: Diagnosis present

## 2021-03-04 DIAGNOSIS — Z881 Allergy status to other antibiotic agents status: Secondary | ICD-10-CM

## 2021-03-04 DIAGNOSIS — M797 Fibromyalgia: Secondary | ICD-10-CM | POA: Diagnosis present

## 2021-03-04 DIAGNOSIS — Z8611 Personal history of tuberculosis: Secondary | ICD-10-CM

## 2021-03-04 DIAGNOSIS — L03115 Cellulitis of right lower limb: Secondary | ICD-10-CM | POA: Diagnosis present

## 2021-03-04 DIAGNOSIS — Z23 Encounter for immunization: Secondary | ICD-10-CM

## 2021-03-04 DIAGNOSIS — Z885 Allergy status to narcotic agent status: Secondary | ICD-10-CM | POA: Diagnosis not present

## 2021-03-04 DIAGNOSIS — E041 Nontoxic single thyroid nodule: Secondary | ICD-10-CM | POA: Diagnosis present

## 2021-03-04 DIAGNOSIS — Z66 Do not resuscitate: Secondary | ICD-10-CM | POA: Diagnosis present

## 2021-03-04 DIAGNOSIS — L0390 Cellulitis, unspecified: Secondary | ICD-10-CM | POA: Diagnosis present

## 2021-03-04 DIAGNOSIS — Z888 Allergy status to other drugs, medicaments and biological substances status: Secondary | ICD-10-CM

## 2021-03-04 DIAGNOSIS — E282 Polycystic ovarian syndrome: Secondary | ICD-10-CM | POA: Diagnosis present

## 2021-03-04 DIAGNOSIS — J45909 Unspecified asthma, uncomplicated: Secondary | ICD-10-CM | POA: Diagnosis present

## 2021-03-04 DIAGNOSIS — E669 Obesity, unspecified: Secondary | ICD-10-CM

## 2021-03-04 DIAGNOSIS — Z7982 Long term (current) use of aspirin: Secondary | ICD-10-CM

## 2021-03-04 DIAGNOSIS — E1169 Type 2 diabetes mellitus with other specified complication: Secondary | ICD-10-CM | POA: Diagnosis not present

## 2021-03-04 DIAGNOSIS — Z28310 Unvaccinated for covid-19: Secondary | ICD-10-CM | POA: Diagnosis not present

## 2021-03-04 DIAGNOSIS — M069 Rheumatoid arthritis, unspecified: Secondary | ICD-10-CM | POA: Diagnosis present

## 2021-03-04 DIAGNOSIS — G4730 Sleep apnea, unspecified: Secondary | ICD-10-CM | POA: Diagnosis present

## 2021-03-04 DIAGNOSIS — Z833 Family history of diabetes mellitus: Secondary | ICD-10-CM

## 2021-03-04 DIAGNOSIS — M109 Gout, unspecified: Secondary | ICD-10-CM | POA: Diagnosis present

## 2021-03-04 DIAGNOSIS — Z8261 Family history of arthritis: Secondary | ICD-10-CM | POA: Diagnosis not present

## 2021-03-04 DIAGNOSIS — Z20822 Contact with and (suspected) exposure to covid-19: Secondary | ICD-10-CM | POA: Diagnosis present

## 2021-03-04 DIAGNOSIS — Z882 Allergy status to sulfonamides status: Secondary | ICD-10-CM

## 2021-03-04 DIAGNOSIS — L089 Local infection of the skin and subcutaneous tissue, unspecified: Secondary | ICD-10-CM | POA: Diagnosis present

## 2021-03-04 HISTORY — DX: Obesity, unspecified: E66.9

## 2021-03-04 LAB — RESP PANEL BY RT-PCR (FLU A&B, COVID) ARPGX2
Influenza A by PCR: NEGATIVE
Influenza B by PCR: NEGATIVE
SARS Coronavirus 2 by RT PCR: NEGATIVE

## 2021-03-04 LAB — COMPREHENSIVE METABOLIC PANEL
ALT: 36 U/L (ref 0–44)
AST: 36 U/L (ref 15–41)
Albumin: 4.3 g/dL (ref 3.5–5.0)
Alkaline Phosphatase: 65 U/L (ref 38–126)
Anion gap: 8 (ref 5–15)
BUN: 18 mg/dL (ref 8–23)
CO2: 24 mmol/L (ref 22–32)
Calcium: 9 mg/dL (ref 8.9–10.3)
Chloride: 104 mmol/L (ref 98–111)
Creatinine, Ser: 0.85 mg/dL (ref 0.44–1.00)
GFR, Estimated: >60 mL/min (ref >60)
Glucose, Bld: 174 mg/dL — ABNORMAL HIGH (ref 70–99)
Potassium: 4.7 mmol/L (ref 3.5–5.1)
Sodium: 136 mmol/L (ref 135–145)
Total Bilirubin: 0.5 mg/dL (ref 0.3–1.2)
Total Protein: 7.3 g/dL (ref 6.5–8.1)

## 2021-03-04 LAB — CBC
HCT: 38.7 % (ref 36.0–46.0)
Hemoglobin: 13.1 g/dL (ref 12.0–15.0)
MCH: 31.5 pg (ref 26.0–34.0)
MCHC: 33.9 g/dL (ref 30.0–36.0)
MCV: 93 fL (ref 80.0–100.0)
Platelets: 300 10*3/uL (ref 150–400)
RBC: 4.16 MIL/uL (ref 3.87–5.11)
RDW: 13.2 % (ref 11.5–15.5)
WBC: 21.7 10*3/uL — ABNORMAL HIGH (ref 4.0–10.5)
nRBC: 0 % (ref 0.0–0.2)

## 2021-03-04 LAB — HEMOGLOBIN A1C
Hgb A1c MFr Bld: 6.7 % — ABNORMAL HIGH (ref 4.8–5.6)
Mean Plasma Glucose: 145.59 mg/dL

## 2021-03-04 LAB — HIV ANTIBODY (ROUTINE TESTING W REFLEX): HIV Screen 4th Generation wRfx: NONREACTIVE

## 2021-03-04 MED ORDER — ACETAMINOPHEN 325 MG PO TABS
650.0000 mg | ORAL_TABLET | Freq: Four times a day (QID) | ORAL | Status: DC | PRN
  Administered 2021-03-04 – 2021-03-05 (×3): 650 mg via ORAL
  Filled 2021-03-04 (×3): qty 2

## 2021-03-04 MED ORDER — OXYCODONE HCL 5 MG PO TABS
5.0000 mg | ORAL_TABLET | ORAL | Status: DC | PRN
  Administered 2021-03-04 (×2): 5 mg via ORAL
  Filled 2021-03-04 (×2): qty 1

## 2021-03-04 MED ORDER — KETOROLAC TROMETHAMINE 30 MG/ML IJ SOLN: 30.0000 mg | Freq: Four times a day (QID) | INTRAMUSCULAR | Status: DC | PRN

## 2021-03-04 MED ORDER — ASPIRIN EC 81 MG PO TBEC
81.0000 mg | DELAYED_RELEASE_TABLET | Freq: Every day | ORAL | Status: DC
Start: 2021-03-04 — End: 2021-03-06
  Administered 2021-03-04 – 2021-03-06 (×3): 81 mg via ORAL
  Filled 2021-03-04 (×3): qty 1

## 2021-03-04 MED ORDER — TIZANIDINE HCL 4 MG PO TABS: 4.0000 mg | ORAL_TABLET | Freq: Every evening | ORAL | Status: DC | PRN

## 2021-03-04 MED ORDER — VANCOMYCIN HCL 1500 MG/300ML IV SOLN
1500.0000 mg | Freq: Once | INTRAVENOUS | Status: AC
  Administered 2021-03-04: 1500 mg via INTRAVENOUS
  Filled 2021-03-04: qty 300

## 2021-03-04 MED ORDER — SUMATRIPTAN SUCCINATE 6 MG/0.5ML ~~LOC~~ SOLN
6.0000 mg | Freq: Once | SUBCUTANEOUS | Status: AC
  Administered 2021-03-04: 6 mg via SUBCUTANEOUS
  Filled 2021-03-04: qty 0.5

## 2021-03-04 MED ORDER — SODIUM CHLORIDE 0.9 % IV SOLN
3.0000 g | Freq: Three times a day (TID) | INTRAVENOUS | Status: DC
  Administered 2021-03-04 – 2021-03-06 (×7): 3 g via INTRAVENOUS
  Filled 2021-03-04: qty 8
  Filled 2021-03-04 (×2): qty 3
  Filled 2021-03-04 (×7): qty 8

## 2021-03-04 MED ORDER — BUTALBITAL-APAP-CAFFEINE 50-325-40 MG PO TABS
1.0000 | ORAL_TABLET | Freq: Two times a day (BID) | ORAL | Status: DC | PRN
  Administered 2021-03-04: 1 via ORAL
  Filled 2021-03-04 (×2): qty 1

## 2021-03-04 MED ORDER — DULOXETINE HCL 60 MG PO CPEP
60.0000 mg | ORAL_CAPSULE | Freq: Two times a day (BID) | ORAL | Status: DC
  Administered 2021-03-04 – 2021-03-06 (×5): 60 mg via ORAL
  Filled 2021-03-04 (×5): qty 1

## 2021-03-04 MED ORDER — DIPHENHYDRAMINE HCL 50 MG/ML IJ SOLN: 25.0000 mg | Freq: Four times a day (QID) | INTRAMUSCULAR | Status: DC | PRN

## 2021-03-04 MED ORDER — KETOROLAC TROMETHAMINE 15 MG/ML IJ SOLN
15.0000 mg | Freq: Once | INTRAMUSCULAR | Status: AC
  Administered 2021-03-04: 15 mg via INTRAVENOUS
  Filled 2021-03-04: qty 1

## 2021-03-04 MED ORDER — ONDANSETRON HCL 4 MG PO TABS
4.0000 mg | ORAL_TABLET | Freq: Four times a day (QID) | ORAL | Status: DC | PRN
  Administered 2021-03-05: 4 mg via ORAL
  Filled 2021-03-04: qty 1

## 2021-03-04 MED ORDER — ONDANSETRON HCL 4 MG/2ML IJ SOLN
4.0000 mg | Freq: Four times a day (QID) | INTRAMUSCULAR | Status: DC | PRN
  Administered 2021-03-05: 4 mg via INTRAVENOUS
  Filled 2021-03-04: qty 2

## 2021-03-04 MED ORDER — HEPARIN SODIUM (PORCINE) 5000 UNIT/ML IJ SOLN
5000.0000 [IU] | Freq: Three times a day (TID) | INTRAMUSCULAR | Status: DC
  Administered 2021-03-04 – 2021-03-06 (×7): 5000 [IU] via SUBCUTANEOUS
  Filled 2021-03-04 (×7): qty 1

## 2021-03-04 MED ORDER — TETANUS-DIPHTH-ACELL PERTUSSIS 5-2.5-18.5 LF-MCG/0.5 IM SUSY
0.5000 mL | PREFILLED_SYRINGE | Freq: Once | INTRAMUSCULAR | Status: AC
  Administered 2021-03-04: 0.5 mL via INTRAMUSCULAR
  Filled 2021-03-04: qty 0.5

## 2021-03-04 MED ORDER — SODIUM CHLORIDE 0.9 % IV BOLUS
1000.0000 mL | Freq: Once | INTRAVENOUS | Status: AC
  Administered 2021-03-04: 1000 mL via INTRAVENOUS

## 2021-03-04 MED ORDER — POLYETHYLENE GLYCOL 3350 17 G PO PACK
17.0000 g | PACK | Freq: Every day | ORAL | Status: AC
  Administered 2021-03-05: 17 g via ORAL
  Filled 2021-03-04 (×2): qty 1

## 2021-03-04 MED ORDER — LORATADINE 10 MG PO TABS
10.0000 mg | ORAL_TABLET | Freq: Every day | ORAL | Status: DC
  Administered 2021-03-04 – 2021-03-05 (×2): 10 mg via ORAL
  Filled 2021-03-04 (×2): qty 1

## 2021-03-04 MED ORDER — ACETAMINOPHEN 650 MG RE SUPP: 650.0000 mg | Freq: Four times a day (QID) | RECTAL | Status: DC | PRN

## 2021-03-04 MED ORDER — VANCOMYCIN HCL 750 MG/150ML IV SOLN
750.0000 mg | Freq: Two times a day (BID) | INTRAVENOUS | Status: DC
  Administered 2021-03-04 – 2021-03-06 (×4): 750 mg via INTRAVENOUS
  Filled 2021-03-04 (×4): qty 150

## 2021-03-04 MED ORDER — SODIUM CHLORIDE 0.9 % IV SOLN
3.0000 g | Freq: Once | INTRAVENOUS | Status: AC
  Administered 2021-03-04: 3 g via INTRAVENOUS
  Filled 2021-03-04: qty 8

## 2021-03-04 NOTE — Plan of Care (Signed)

## 2021-03-04 NOTE — Progress Notes (Signed)
[facility] PROGRESS NOTE    Progress Note  Christina Lewis  [id number] DOB: Jan 21, 1956 DOA: 03/04/2021 PCP: Monico Blitz, MD     Brief Narrative:   Christina Lewis is an 66 y.o. female past medical history of rheumatoid arthritis on Plaquenil, sleep apnea, polycystic ovarian syndrome, diabetes mellitus type 2 fibromyalgia comes into the ED with a chief complaint of cat related foot puncture, SARS-CoV-2 and PCR influenza were negative, the patient is a DNR afebrile in the ED white count of 21k hemoglobin of 13 platelet count of 300 right foot x-ray showed increased soft tissue edema she was started on IV Unasyn.    Assessment/Plan:   Right foot cellulitis: Started on IV Unasyn, she is up-to-date on her tetanus shot. Blood cultures were sent after the first dose of antibiotic. Remain afebrile but has leukocytosis of 21,000, she did receive a steroid shot a few days prior to admission. Out of bed to chair consult physical therapy. Erythema is is extending up her thigh we will go ahead and add IV vancomycin. Discontinue Toradol for pain continue oxygen use Tylenol for fever  Fibromyalgia Continue Cymbalta.  Rheumatoid arthritis ([place]) Can resume Plaquenil as an outpatient.  Diabetes mellitus type 2 in obese (HCC) Last A1c about a year ago 5.7 we will go ahead and repeat. Continued Will place on sliding scale insulin.  DVT prophylaxis: lovenox Family Communication:none Status is: Observation  The patient will require care spanning > 2 midnights and should be moved to inpatient because: Progressing right lower extremity cellulitis.   Code Status:     Code Status Orders  (From admission, onward)           Start     Ordered   03/04/21 0703  Do not attempt resuscitation (DNR)  Continuous       Question Answer Comment  In the event of cardiac or respiratory ARREST Do not call a code blue   In the event of cardiac or respiratory ARREST Do not perform  Intubation, CPR, defibrillation or ACLS   In the event of cardiac or respiratory ARREST Use medication by any route, position, wound care, and other measures to relive pain and suffering. May use oxygen, suction and manual treatment of airway obstruction as needed for comfort.      03/04/21 0702           Code Status History     Date Active Date Inactive Code Status Order ID Comments User Context   09/11/2019 1101 09/11/2019 1858 Full Code [id number]  Erline Levine, MD Inpatient         IV Access:   Peripheral IV   Procedures and diagnostic studies:   DG Foot Complete Right  Result Date: 03/04/2021 CLINICAL DATA:  Looking for metallic foreign body in the soft tissues, first MTP joint level, swelling and redness. EXAM: RIGHT FOOT COMPLETE - 3+ VIEW COMPARISON:  AP Lat right foot series 04/09/2020. FINDINGS: There is no evidence of fracture or dislocation. There is no evidence of arthropathy or other focal bone abnormality. Once again the MTP joints are hyperextended, which was also seen previously. There is moderate edema in the midfoot and forefoot, greater medially without evidence of radiopaque foreign body. Small posterior calcaneal enthesophyte is again shown. IMPRESSION: Increased soft tissue edema, but no radiopaque foreign body or appreciable soft tissue gas. No erosive bone lesion is seen. The toes are hyperextended which was also noted previously. Electronically Signed   By: Ninfa Linden.D.  On: 03/04/2021 02:45     Medical Consultants:   None.   Subjective:    Christina Lewis relates her pain is not controlled.   Objective:    Vitals:   03/04/21 0300 03/04/21 0330 03/04/21 0400 03/04/21 0645  BP: 128/84 120/77 128/85 120/76  Pulse: (!) 103 96 (!) 102 (!) 103  Resp: 18 18 18 16   Temp:   98.1 F (36.7 C) 98.5 F (36.9 C)  TempSrc:   Oral   SpO2: 99% 100% 99% 97%  Weight:      Height:       SpO2: 97 %  No intake or output data in the 24 hours  ending 03/04/21 0728 Filed Weights   03/04/21 0143  Weight: 78 kg    Exam: General exam: In no acute distress. Respiratory system: Good air movement and clear to auscultation. Cardiovascular system: S1 & S2 heard, RRR. No JVD. Gastrointestinal system: Abdomen is nondistended, soft and nontender.  Extremities: On her right big toe, no pain to passive movement of the joint she move the joint without any difficulties, the erythema is extending up her calf and inner thigh warm and exquisitely tender to palpation, significant lymphadenopathy on her right inguinal area Skin: No rashes, lesions or ulcers Psychiatry: Judgement and insight appear normal. Mood & affect appropriate.    Data Reviewed:    Labs: Basic Metabolic Panel: Recent Labs  Lab 03/04/21 0300  NA 136  K 4.7  CL 104  CO2 24  GLUCOSE 174*  BUN 18  CREATININE 0.85  CALCIUM 9.0   GFR Estimated Creatinine Clearance: 67.6 mL/min (by C-G formula based on SCr of 0.85 mg/dL). Liver Function Tests: Recent Labs  Lab 03/04/21 0300  AST 36  ALT 36  ALKPHOS 65  BILITOT 0.5  PROT 7.3  ALBUMIN 4.3   No results for input(s): LIPASE, AMYLASE in the last 168 hours. No results for input(s): AMMONIA in the last 168 hours. Coagulation profile No results for input(s): INR, PROTIME in the last 168 hours. COVID-19 Labs  No results for input(s): DDIMER, FERRITIN, LDH, CRP in the last 72 hours.  Lab Results  Component Value Date   SARSCOV2NAA NEGATIVE 03/04/2021   [place] NEGATIVE 12/05/2019   [place] NEGATIVE 11/30/2019   [place] NEGATIVE 09/07/2019    CBC: Recent Labs  Lab 03/04/21 0300  WBC 21.7*  HGB 13.1  HCT 38.7  MCV 93.0  PLT 300   Cardiac Enzymes: No results for input(s): CKTOTAL, CKMB, CKMBINDEX, TROPONINI in the last 168 hours. BNP (last 3 results) No results for input(s): PROBNP in the last 8760 hours. CBG: No results for input(s): GLUCAP in the last 168 hours. D-Dimer: No results  for input(s): DDIMER in the last 72 hours. Hgb A1c: No results for input(s): HGBA1C in the last 72 hours. Lipid Profile: No results for input(s): CHOL, HDL, LDLCALC, TRIG, CHOLHDL, LDLDIRECT in the last 72 hours. Thyroid function studies: No results for input(s): TSH, T4TOTAL, T3FREE, THYROIDAB in the last 72 hours.  Invalid input(s): FREET3 Anemia work up: No results for input(s): VITAMINB12, FOLATE, FERRITIN, TIBC, IRON, RETICCTPCT in the last 72 hours. Sepsis Labs: Recent Labs  Lab 03/04/21 0300  WBC 21.7*   Microbiology Recent Results (from the past 240 hour(s))  Resp Panel by RT-PCR (Flu A&B, Covid) Nasopharyngeal Swab     Status: None   Collection Time: 03/04/21  3:55 AM   Specimen: Nasopharyngeal Swab; Nasopharyngeal(NP) swabs in vial transport medium  Result Value Ref Range Status  SARS Coronavirus 2 by RT PCR NEGATIVE NEGATIVE Final    Comment: (NOTE) SARS-CoV-2 target nucleic acids are NOT DETECTED.  The SARS-CoV-2 RNA is generally detectable in upper respiratory specimens during the acute phase of infection. The lowest concentration of SARS-CoV-2 viral copies this assay can detect is 138 copies/mL. A negative result does not preclude SARS-Cov-2 infection and should not be used as the sole basis for treatment or other patient management decisions. A negative result may occur with  improper specimen collection/handling, submission of specimen other than nasopharyngeal swab, presence of viral mutation(s) within the areas targeted by this assay, and inadequate number of viral copies(<138 copies/mL). A negative result must be combined with clinical observations, patient history, and epidemiological information. The expected result is Negative.  Fact Sheet for Patients:  EntrepreneurPulse.com.au  Fact Sheet for Healthcare Providers:  IncredibleEmployment.be  This test is no t yet approved or cleared by the [place] FDA and   has been authorized for detection and/or diagnosis of SARS-CoV-2 by FDA under an Emergency Use Authorization (EUA). This EUA will remain  in effect (meaning this test can be used) for the duration of the COVID-19 declaration under Section 564(b)(1) of the Act, 21 U.S.C.section 360bbb-3(b)(1), unless the authorization is terminated  or revoked sooner.       Influenza A by PCR NEGATIVE NEGATIVE Final   Influenza B by PCR NEGATIVE NEGATIVE Final    Comment: (NOTE) The Xpert Xpress SARS-CoV-2/FLU/RSV plus assay is intended as an aid in the diagnosis of influenza from Nasopharyngeal swab specimens and should not be used as a sole basis for treatment. Nasal washings and aspirates are unacceptable for Xpert Xpress SARS-CoV-2/FLU/RSV testing.  Fact Sheet for Patients: EntrepreneurPulse.com.au  Fact Sheet for Healthcare Providers: IncredibleEmployment.be  This test is not yet approved or cleared by the [place] FDA and has been authorized for detection and/or diagnosis of SARS-CoV-2 by FDA under an Emergency Use Authorization (EUA). This EUA will remain in effect (meaning this test can be used) for the duration of the COVID-19 declaration under Section 564(b)(1) of the Act, 21 U.S.C. section 360bbb-3(b)(1), unless the authorization is terminated or revoked.  Performed at [facility], [address], [place], [place] [address]      Medications:    aspirin EC  81 mg Oral Daily   DULoxetine  60 mg Oral BID   heparin  5,000 Units Subcutaneous Q8H   levocetirizine  5 mg Oral QHS   Continuous Infusions:  ampicillin-sulbactam (UNASYN) IV        LOS: 0 days   Charlynne Cousins  [facility]  03/04/2021, 7:28 AM

## 2021-03-04 NOTE — Progress Notes (Signed)
Pt admitted for cellulitis to R lower extremity. Pt has complained of pain with relief of PRN Tylenol and Oxycodone today. Pt has independently ambulated in room all throughout shift today. Husband at bedside.

## 2021-03-04 NOTE — Progress Notes (Signed)
Pharmacy Antibiotic Note  Christina Lewis a 66 y.o. female admitted on 03/04/2021 with cellulitis.  Pharmacy has been consulted for vancomycin dosing.  Plan: Vancomycin 750mg  IV every 12 hours.  Goal trough 10-15 mcg/mL.  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, took medication for a year   Thyroid nodule     Allergies:  Allergies  Allergen Reactions   Sulfa Antibiotics Anaphylaxis   Codeine Itching   Erythromycin Nausea And Vomiting   Metronidazole     Diarrhea    Morphine And Related Itching    Filed Weights   03/04/21 0143  Weight: 78 kg (172 lb)    CBC Latest Ref Rng & Units 03/04/2021 09/11/2020 03/24/2020  WBC 4.0 - 10.5 K/uL 21.7(H) 10.4 10.3  Hemoglobin 12.0 - 15.0 g/dL 13.1 13.4 13.4  Hematocrit 36.0 - 46.0 % 38.7 41.1 38.5  Platelets 150 - 400 K/uL 300 367 344     Estimated Creatinine Clearance: 67.6 mL/min (by C-G formula based on SCr of 0.85 mg/dL).  Antibiotics Given (last 72 hours)     Date/Time Action Medication Dose Rate   03/04/21 0303 New Bag/Given   Ampicillin-Sulbactam (UNASYN) 3 g in sodium chloride 0.9 % 100 mL IVPB 3 g 200 mL/hr       Antimicrobials this admission:  vancomycin 03/04/2021  >>   Microbiology results: 03/04/2021  BCx: sent 03/04/2021  UCx: sent 03/04/2021  Resp Panel: negative 03/04/2021  MRSA PCR: sent  Thank you for allowing pharmacy to be a part of this patients care.  Thomasenia Sales, PharmD Clinical Pharmacist

## 2021-03-04 NOTE — ED Provider Notes (Signed)
[facility] Emergency Department Provider Note MRN:  [id number]  Arrival date & time: 03/04/21     Chief Complaint   Foot Pain   History of Present Illness   Christina Lewis is a 66 y.o. year-old female with a history of diabetes presenting to the ED with chief complaint of foot pain.  Patient explains that her cat put a piece of a toy into her shoe and she stepped into the shoe and this caused a puncture wound.  The piece of the [place] was a metal pin or barb.  Patient has had rapid spread of redness and pain to the area rising up the leg over the past 24 hours.  Feeling some mild subjective fever as well.  Dull frontal headache, history of migraines.  No other complaints.  Review of Systems  A thorough review of systems was obtained and all systems are negative except as noted in the HPI and PMH.   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, took medication for a year   Thyroid nodule     Past Surgical History:  Procedure Laterality Date   ABDOMINAL HYSTERECTOMY  2000   APPENDECTOMY  1976   BREAST LUMPECTOMY  1989   lt-negative   CARDIOVASCULAR STRESS TEST  2000   CATARACT EXTRACTION W/PHACO Left 12/07/2019   Procedure: CATARACT EXTRACTION PHACO AND INTRAOCULAR LENS PLACEMENT ([place]);  Surgeon: Baruch Goldmann, MD;  Location: [facility];  Service: Ophthalmology;  Laterality: Left;  CDE: 5.55   CHOLECYSTECTOMY  1985   COLONOSCOPY     HYSTERECTOMY ABDOMINAL WITH SALPINGECTOMY Bilateral 2000   KIDNEY STONE  SURGERY Right 2014   with stent placement in OR   KNEE ARTHROSCOPY Right 08/22/2013   Procedure: ARTHROSCOPY RIGHT KNEE FOR INFECTION LAVAGE AND DRAINAGE;  Surgeon: Yvette Rack., MD;  Location: [place];  Service: Orthopedics;  Laterality: Right;   KNEE ARTHROSCOPY WITH PATELLA RECONSTRUCTION Right 08/06/2013   Procedure: RIGHT KNEE ARTHROSCOPY WITH MENISCECTOMY MEDIAL, ARTHROSCOPY KNEE WITH DEBRIDEMENT/SHAVING (CHONDROPLASTY) ;  Surgeon: Yvette Rack., MD;  Location: [place];  Service: Orthopedics;  Laterality: Right;   LUMBAR LAMINECTOMY/DECOMPRESSION MICRODISCECTOMY Right 09/11/2019   Procedure: Right Lumbar Three-Four Microdiscectomy;  Surgeon: Erline Levine, MD;  Location: [place];  Service: Neurosurgery;  Laterality: Right;  Right Lumbar Three-Four Microdiscectomy   NASAL SINUS SURGERY     x4   OOPHORECTOMY     SHOULDER SURGERY  2011   right shoulder   THYROID SURGERY  1994   TONSILLECTOMY Bilateral 1974    Family History  Problem Relation Age of Onset   Fibromyalgia Mother    Psoriasis Mother        psoriatic arthritis    Diabetes Mother    Heart disease Mother    Psoriasis Sister        psoriatic arthritis    Diabetes Sister  Rheum arthritis Sister    Fibromyalgia Sister    Diabetes Sister     Social History   Socioeconomic History   Marital status: Married    Spouse name: Not on file   Number of children: Not on file   Years of education: Not on file   Highest education level: Not on file  Occupational History   Not on file  Tobacco Use   Smoking status: Never   Smokeless tobacco: Never  Vaping Use   Vaping Use: Never used  Substance and Sexual Activity   Alcohol use: No   Drug use: Never   Sexual activity: Yes  Other Topics Concern   Not on file  Social History Narrative   Not on file   Social Determinants of Health   Financial Resource Strain: Not on file  Food Insecurity: Not on file  Transportation Needs: Not  on file  Physical Activity: Not on file  Stress: Not on file  Social Connections: Not on file  Intimate Partner Violence: Not on file     Physical Exam   Vitals:   03/04/21 0141  BP: 121/82  Pulse: (!) 109  Resp: 16  Temp: 97.8 F (36.6 C)  SpO2: 100%    CONSTITUTIONAL: Well-appearing, NAD NEURO/PSYCH:  Alert and oriented x 3, no focal deficits EYES:  eyes equal and reactive ENT/NECK:  no LAD, no JVD CARDIO: Regular rate, well-perfused, normal S1 and S2 PULM:  CTAB no wheezing or rhonchi GI/GU:  non-distended, non-tender MSK/SPINE:  No gross deformities, no edema SKIN: Small wound to the right toe with extensive surrounding erythema involving the dorsum of the foot, there is also erythema to the right upper thigh   *Additional and/or pertinent findings included in MDM below  Diagnostic and Interventional Summary    EKG Interpretation  Date/Time:    Ventricular Rate:    PR Interval:    QRS Duration:   QT Interval:    QTC Calculation:   R Axis:     Text Interpretation:         Labs Reviewed  CBC - Abnormal; Notable for the following components:      Result Value   WBC 21.7 (*)    All other components within normal limits  RESP PANEL BY RT-PCR (FLU A&B, COVID) [id number]  COMPREHENSIVE METABOLIC PANEL    DG Foot Complete Right  Final Result      Medications  Ampicillin-Sulbactam (UNASYN) 3 g in sodium chloride 0.9 % 100 mL IVPB (3 g Intravenous New Bag/Given 03/04/21 0303)  sodium chloride 0.9 % bolus 1,000 mL (1,000 mLs Intravenous New Bag/Given 03/04/21 0302)  ketorolac (TORADOL) 15 MG/ML injection 15 mg (15 mg Intravenous Given 03/04/21 0301)     Procedures  /  Critical Care Procedures  ED Course and Medical Decision Making  Initial Impression and Ddx Concern for spreading cellulitis, possibly lymphangitis.  Other considerations include retained foreign body, osteomyelitis given the puncture nature of the wound.  Pasteurella infection is a concern given  the injury occurring from a cat toy.  Starting fluids, Unasyn, anticipating admission.  Past medical/surgical history that increases complexity of ED encounter: Diabetes and impaired wound healing  Interpretation of Diagnostics I personally reviewed the foot x-ray and my interpretation is as follows: No obvious foreign body or bony abnormality Clinical Course as of 03/04/21 0317  Wed Mar 04, 2021  0316 WBC(!): 21.7 [MB]    Clinical Course User Index [MB] Maudie Flakes, MD    Prominent  leukocytosis  Patient Reassessment and Ultimate Disposition/Management Patient is receiving IV antibiotics and will be admitted to the hospitalist service.  Patient management required discussion with the following services or consulting groups:  Hospitalist Service  Complexity of Problems Addressed Acute illness or injury that poses threat of life of bodily function  Additional Data Reviewed and Analyzed Further history obtained from: Further history from spouse/family member  Patient Encounter Risk Assessment High:  Consideration of hospitalization  Barth Kirks. [place] Small, [place] [email or web address]  Final Clinical Impressions(s) / ED Diagnoses     ICD-10-CM   1. Cellulitis of right lower extremity  L03.115       ED Discharge Orders     None        Discharge Instructions Discussed with and Provided to Patient:   Discharge Instructions   None      Maudie Flakes, MD 03/04/21 [phone]

## 2021-03-04 NOTE — ED Triage Notes (Signed)
Pt states she cut right great toe on cat toy yesterday and not has redness and swelling going up the lower leg. Pt also c/o lymph node swelling to the right groin.

## 2021-03-04 NOTE — H&P (Signed)
TRH H&P    Patient Demographics:    Christina Lewis, is a 66 y.o. female  MRN: [id number]  DOB - 1955-06-12  Admit Date - 03/04/2021  Referring MD/NP/PA: Sedonia Small  Outpatient Primary MD for the patient is Monico Blitz, MD  Patient coming from: Home  Chief complaint- cat related puncture to foot   HPI:    Christina Lewis  is a 66 y.o. female, with history of rheumatoid arthritis on Plaquenil, thyroid nodule, sleep apnea, PCOS, diabetes mellitus type 2 -diet controlled, asthma, fibromyalgia, GERD, presents ED with a chief complaint of a cat related foot puncture.  Patient ports that she has 6 cats.  They have not.  There is a wire wrapped in plastic.  They changed the plastic and put the toy in her house slipper.  When she put her foot into the slipper it punctured her foot.  Husband at bedside reports that the cats dragged that same toys through the litter pan as well, so to anybody status what her foot could be exposed to.  This happened Monday evening.  By Tuesday evening she had red streaking up her leg.  Her foot is swollen.  And painful.  She describes the pain as a dull throbbing deep pain.  Rest makes it better.  Toradol offered no relief.  Walking makes it worse.  Patient is a diabetic but she is diet controlled.  She has had subjective fever and chills, but no measured fever.  Patient's only other complaint at this time is her headache.  She does have chronic headaches for which she is on Fioricet.  She has a headache now but no change in vision, no change in hearing.  Fluids helped the headache briefly, and then the headache came back.  Patient has no other complaints.  Patient does not smoke, does not drink alcohol, does not use illicit drugs, and is not vaccinated for COVID.  Patient is DNR.  In the ED Temp 97.8, heart rate 109, respiratory 16, blood pressure 121/82 Leukocytosis with white blood cell count of  21.7, hemoglobin 13.1 platelets 300 Chemistry panel unremarkable Right foot x-ray shows increased soft tissue edema.  No foreign body or soft tissue gas.  No erosive bone lesion seen.  Toes are hyperextended which is chronically seen. Unasyn x1 given in the ED Toradol and 1 L bolus given  Of note patient would like to make it known that she had an L5/S1 cortisone injection on Monday.    Review of systems:    In addition to the HPI above,  Subjective fever and chills Chronic headache no changes with Vision or hearing, No problems swallowing food or Liquids, No Chest pain, Cough or Shortness of Breath, No Abdominal pain, No Nausea or Vomiting, bowel movements are regular, No Blood in stool or Urine, No dysuria, No new weakness, tingling, numbness in any extremity, No recent weight gain or loss, No polyuria, polydypsia or polyphagia, No significant Mental Stressors.  All other systems reviewed and are negative.    Past History of the following :  Past Medical History:  Diagnosis Date   Arthritis    Asthma 2007   Depression    Diabetes mellitus    Fibromyalgia    GERD (gastroesophageal reflux disease)    Gout    Hematuria    History of kidney stones    History of tics    Interstitial cystitis 2016   Migraines    Osteoarthritis    PCOS (polycystic ovarian syndrome) 1993   Pneumonia 2019   PONV (postoperative nausea and vomiting)    Sleep apnea    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, took medication for a year   Thyroid nodule       Past Surgical History:  Procedure Laterality Date   ABDOMINAL HYSTERECTOMY  2000   APPENDECTOMY  1976   BREAST LUMPECTOMY  1989   lt-negative   CARDIOVASCULAR STRESS TEST  2000   CATARACT EXTRACTION W/PHACO Left 12/07/2019   Procedure: CATARACT EXTRACTION PHACO AND INTRAOCULAR LENS PLACEMENT ([place]);  Surgeon: Baruch Goldmann, MD;  Location: [facility];  Service: Ophthalmology;  Laterality: Left;  CDE: 5.55    CHOLECYSTECTOMY  1985   COLONOSCOPY     HYSTERECTOMY ABDOMINAL WITH SALPINGECTOMY Bilateral 2000   KIDNEY STONE SURGERY Right 2014   with stent placement in OR   KNEE ARTHROSCOPY Right 08/22/2013   Procedure: ARTHROSCOPY RIGHT KNEE FOR INFECTION LAVAGE AND DRAINAGE;  Surgeon: Yvette Rack., MD;  Location: [facility];  Service: Orthopedics;  Laterality: Right;   KNEE ARTHROSCOPY WITH PATELLA RECONSTRUCTION Right 08/06/2013   Procedure: RIGHT KNEE ARTHROSCOPY WITH MENISCECTOMY MEDIAL, ARTHROSCOPY KNEE WITH DEBRIDEMENT/SHAVING (CHONDROPLASTY) ;  Surgeon: Yvette Rack., MD;  Location: [facility];  Service: Orthopedics;  Laterality: Right;   LUMBAR LAMINECTOMY/DECOMPRESSION MICRODISCECTOMY Right 09/11/2019   Procedure: Right Lumbar Three-Four Microdiscectomy;  Surgeon: Erline Levine, MD;  Location: [facility];  Service: Neurosurgery;  Laterality: Right;  Right Lumbar Three-Four Microdiscectomy   NASAL SINUS SURGERY     x4   OOPHORECTOMY     SHOULDER SURGERY  2011   right shoulder   THYROID SURGERY  1994   TONSILLECTOMY Bilateral 1974      Social History:      Social History   Tobacco Use   Smoking status: Never   Smokeless tobacco: Never  Substance Use Topics   Alcohol use: No       Family History :     Family History  Problem Relation Age of Onset   Fibromyalgia Mother    Psoriasis Mother        psoriatic arthritis    Diabetes Mother    Heart disease Mother    Psoriasis Sister        psoriatic arthritis    Diabetes Sister    Rheum arthritis Sister    Fibromyalgia Sister    Diabetes Sister       Home Medications:   Prior to Admission medications   Medication Sig Start Date End Date Taking? Authorizing Provider  aspirin EC 81 MG tablet Take 81 mg by mouth daily. Swallow whole.    [provider]  Atogepant (QULIPTA) 60 MG TABS  02/27/20   [provider]  azelastine (OPTIVAR) 0.05 % ophthalmic solution Place 1 drop into  both eyes 2 (two) times daily as needed (allergy eyes.).  05/09/19   [provider]  Azelastine-Fluticasone 137-50 MCG/ACT SUSP Place 1 spray into the nose every 12 (twelve) hours. 11/23/20   Geryl Rankins  W, NP  baclofen (LIORESAL) 10 MG tablet TAKE 1 TABLET EVERY MORNING AND 1 TABLET AT NOON. 03/03/21   Bo Merino, MD  benzonatate (TESSALON) 100 MG capsule Take 1-2 capsules (100-200 mg total) by mouth 2 (two) times daily as needed for cough. 11/23/20   Gildardo Pounds, NP  butalbital-acetaminophen-caffeine (FIORICET, ESGIC) [phone] MG tablet Take 1-2 tablets by mouth 2 (two) times daily as needed for migraine.  08/31/16   [provider]  cholecalciferol (VITAMIN D) 25 MCG (1000 UNIT) tablet Take 1,000 Units by mouth daily.    [provider]  diclofenac Sodium (VOLTAREN) 1 % GEL APPLY 2 TO 4 GRAMS TO AFFECTED JOINTS UP TO 4 TIMES DAILY AS NEEDED. Patient taking differently: Apply 2 g topically 4 (four) times daily as needed (joint pain). 09/20/19   Bo Merino, MD  DULoxetine (CYMBALTA) 60 MG capsule Take 120 mg by mouth daily.    [provider]  eletriptan (RELPAX) 40 MG tablet Take 40 mg by mouth 2 (two) times daily as needed for migraine. 08/10/19   [provider]  ferrous sulfate 325 (65 FE) MG tablet Take 325 mg by mouth daily with breakfast.    [provider]  hydroxychloroquine (PLAQUENIL) 200 MG tablet TAKE 1 TABLET TWICE DAILY-MONDAY THRU FRIDAY FOR RHEUMATOID ARTHRITIS. 01/02/21   Ofilia Neas, PA-C  levocetirizine (XYZAL) 5 MG tablet Take 5 mg by mouth at bedtime. 07/11/19   [provider]  lithium carbonate (ESKALITH) 450 MG CR tablet  02/20/20   [provider]  MAGNESIUM PO Take 500 mg by mouth daily.     [provider]  Multiple Vitamin (MULTIVITAMIN WITH MINERALS) TABS tablet Take 1 tablet by mouth daily.    [provider]  ondansetron (ZOFRAN-ODT) 8 MG disintegrating tablet  Take 8 mg by mouth every 8 (eight) hours as needed for nausea.     [provider]  tiZANidine (ZANAFLEX) 4 MG tablet TAKE 1 TABLET AT BEDTIME AS NEEDED. 03/03/21   Bo Merino, MD  TURMERIC-GINGER PO Take 1 tablet by mouth daily.    [provider]     Allergies:     Allergies  Allergen Reactions   Sulfa Antibiotics Anaphylaxis   Codeine Itching   Erythromycin Nausea And Vomiting   Metronidazole     Diarrhea    Morphine And Related Itching     Physical Exam:   Vitals  Blood pressure 128/85, pulse (!) 102, temperature 98.1 F (36.7 C), temperature source Oral, resp. rate 18, height 5\' 4"  (1.626 m), weight 78 kg, SpO2 99 %.   1.  General: Patient lying supine in bed,  no acute distress   2. Psychiatric: Alert and oriented x 3, mood and behavior normal for situation, pleasant and cooperative with exam   3. Neurologic: Speech and language are normal, face is symmetric, moves all 4 extremities voluntarily, at baseline without acute deficits on limited exam   4. HEENMT:  Head is atraumatic, normocephalic, pupils reactive to light, neck is supple, trachea is midline, mucous membranes are moist   5. Respiratory : Lungs are clear to auscultation bilaterally without wheezing, rhonchi, rales, no cyanosis, no increase in work of breathing or accessory muscle use   6. Cardiovascular : Heart rate normal, rhythm is regular, no murmurs, rubs or gallops,  peripheral pulses palpated   7. Gastrointestinal:  Abdomen is soft, nondistended, nontender to palpation bowel sounds active, no masses or organomegaly palpated   8. Skin:  Dark  red erythema of right foot starting at a puncture wound on the medial aspect of the base of the great toe.  Red streaking seen over the dorsum of the foot, not on the leg and then up at her medial thigh.  No palpable abscess, no draining wounds.   9.Musculoskeletal:  No acute deformities or trauma, no asymmetry in tone, peripheral  pulses palpated, no tenderness to palpation in the extremities     Data Review:    CBC Recent Labs  Lab 03/04/21 0300  WBC 21.7*  HGB 13.1  HCT 38.7  PLT 300  MCV 93.0  MCH 31.5  MCHC 33.9  RDW 13.2   ------------------------------------------------------------------------------------------------------------------  Results for orders placed or performed during the hospital encounter of 03/04/21 (from the past 48 hour(s))  CBC     Status: Abnormal   Collection Time: 03/04/21  3:00 AM  Result Value Ref Range   WBC 21.7 (H) 4.0 - 10.5 K/uL   RBC 4.16 3.87 - 5.11 MIL/uL   Hemoglobin 13.1 12.0 - 15.0 g/dL   HCT 38.7 36.0 - 46.0 %   MCV 93.0 80.0 - 100.0 fL   MCH 31.5 26.0 - 34.0 pg   MCHC 33.9 30.0 - 36.0 g/dL   RDW 13.2 11.5 - 15.5 %   Platelets 300 150 - 400 K/uL   nRBC 0.0 0.0 - 0.2 %    Comment: Performed at [facility], [address] [place], [place] [address]  Comprehensive metabolic panel     Status: Abnormal   Collection Time: 03/04/21  3:00 AM  Result Value Ref Range   Sodium 136 135 - 145 mmol/L   Potassium 4.7 3.5 - 5.1 mmol/L   Chloride 104 98 - 111 mmol/L   CO2 24 22 - 32 mmol/L   Glucose, Bld 174 (H) 70 - 99 mg/dL    Comment: Glucose reference range applies only to samples taken after fasting for at least 8 hours.   BUN 18 8 - 23 mg/dL   Creatinine, Ser 0.85 0.44 - 1.00 mg/dL   Calcium 9.0 8.9 - 10.3 mg/dL   Total Protein 7.3 6.5 - 8.1 g/dL   Albumin 4.3 3.5 - 5.0 g/dL   AST 36 15 - 41 U/L   ALT 36 0 - 44 U/L   Alkaline Phosphatase 65 38 - 126 U/L   Total Bilirubin 0.5 0.3 - 1.2 mg/dL   GFR, Estimated >60 >60 mL/min    Comment: (NOTE) Calculated using the CKD-EPI Creatinine Equation (2021)    Anion gap 8 5 - 15    Comment: Performed at [facility], [address], [place], [place] [address]    Chemistries  Recent Labs  Lab 03/04/21 0300  NA 136  K 4.7  CL 104  CO2 24  GLUCOSE 174*  BUN 18  CREATININE 0.85  CALCIUM 9.0  AST 36  ALT  36  ALKPHOS 65  BILITOT 0.5   ------------------------------------------------------------------------------------------------------------------  ------------------------------------------------------------------------------------------------------------------ GFR: Estimated Creatinine Clearance: 67.6 mL/min (by C-G formula based on SCr of 0.85 mg/dL). Liver Function Tests: Recent Labs  Lab 03/04/21 0300  AST 36  ALT 36  ALKPHOS 65  BILITOT 0.5  PROT 7.3  ALBUMIN 4.3   No results for input(s): LIPASE, AMYLASE in the last 168 hours. No results for input(s): AMMONIA in the last 168 hours. Coagulation Profile: No results for input(s): INR, PROTIME in the last 168 hours. Cardiac Enzymes: No results for input(s): CKTOTAL, CKMB, CKMBINDEX, TROPONINI in the last 168 hours.  BNP (last 3 results) No results for input(s): PROBNP in the last 8760 hours. HbA1C: No results for input(s): HGBA1C in the last 72 hours. CBG: No results for input(s): GLUCAP in the last 168 hours. Lipid Profile: No results for input(s): CHOL, HDL, LDLCALC, TRIG, CHOLHDL, LDLDIRECT in the last 72 hours. Thyroid Function Tests: No results for input(s): TSH, T4TOTAL, FREET4, T3FREE, THYROIDAB in the last 72 hours. Anemia Panel: No results for input(s): VITAMINB12, FOLATE, FERRITIN, TIBC, IRON, RETICCTPCT in the last 72 hours.  --------------------------------------------------------------------------------------------------------------- Urine analysis:    Component Value Date/Time   COLORURINE YELLOW 09/11/2020 1232   APPEARANCEUR CLEAR 09/11/2020 1232   LABSPEC 1.020 09/11/2020 1232   PHURINE 6.0 09/11/2020 1232   GLUCOSEU NEGATIVE 09/11/2020 1232   HGBUR SMALL (A) 09/11/2020 1232   BILIRUBINUR NEGATIVE 09/11/2020 1232   KETONESUR NEGATIVE 09/11/2020 1232   PROTEINUR NEGATIVE 09/11/2020 1232   UROBILINOGEN 0.2 09/21/2011 2323   NITRITE NEGATIVE 09/11/2020 1232   LEUKOCYTESUR TRACE (A) 09/11/2020 1232       Imaging Results:    DG Foot Complete Right  Result Date: 03/04/2021 CLINICAL DATA:  Looking for metallic foreign body in the soft tissues, first MTP joint level, swelling and redness. EXAM: RIGHT FOOT COMPLETE - 3+ VIEW COMPARISON:  AP Lat right foot series 04/09/2020. FINDINGS: There is no evidence of fracture or dislocation. There is no evidence of arthropathy or other focal bone abnormality. Once again the MTP joints are hyperextended, which was also seen previously. There is moderate edema in the midfoot and forefoot, greater medially without evidence of radiopaque foreign body. Small posterior calcaneal enthesophyte is again shown. IMPRESSION: Increased soft tissue edema, but no radiopaque foreign body or appreciable soft tissue gas. No erosive bone lesion is seen. The toes are hyperextended which was also noted previously. Electronically Signed   By: Telford Nab M.D.   On: 03/04/2021 02:45      Assessment & Plan:    Principal Problem:   Cellulitis Active Problems:   Fibromyalgia   Rheumatoid arthritis ([place])   Diabetes mellitus type 2 in obese (HCC)   Cellulitis Similar to cat bite mechanism Continue Unasyn Update tetanus shot Pain scale for pain control Blood cultures pending-unfortunately first dose of antibiotic already given Continue to monitor Fibromyalgia Continue Cymbalta Rheumatoid arthritis Holding Plaquenil in the setting of acute infection Diabetes mellitus type 2 Diet controlled Heart healthy carb modified diet    DVT Prophylaxis-   Heparin - SCDs   AM Labs Ordered, also please review Full Orders  Family Communication: Admission, patients condition and plan of care including tests being ordered have been discussed with the patient and husband who indicate understanding and agree with the plan and Code Status.  Code Status: DNR  Admission status: Observation      Disposition: Anticipated Discharge date 24 hours discharge to home  Time spent  in minutes : [place]

## 2021-03-04 NOTE — Progress Notes (Signed)
°  Transition of Care [facility]) Screening Note   Patient Details  Name: Christina Lewis Date of Birth: 1955/05/17   Transition of Care [facility]) CM/SW Contact:    Boneta Lucks, RN Phone Number: 03/04/2021, 11:14 AM    Transition of Care Department [facility]) has reviewed patient and no TOC needs have been identified at this time. We will continue to monitor patient advancement through interdisciplinary progression rounds. If new patient transition needs arise, please place a TOC consult.

## 2021-03-04 NOTE — Evaluation (Signed)
Physical Therapy Evaluation Patient Details Name: Christina Lewis MRN: [id number] DOB: [birth year] Today's Date: 03/04/2021  History of Present Illness  Christina Lewis  is a 66 y.o. female, with history of rheumatoid arthritis on Plaquenil, thyroid nodule, sleep apnea, PCOS, diabetes mellitus type 2 -diet controlled, asthma, fibromyalgia, GERD, presents ED with a chief complaint of a cat related foot puncture.  Patient ports that she has 6 cats.  They have not.  There is a wire wrapped in plastic.  They changed the plastic and put the toy in her house slipper.  When she put her foot into the slipper it punctured her foot.  Husband at bedside reports that the cats dragged that same toys through the litter pan as well, so to anybody status what her foot could be exposed to.  This happened Monday evening.  By Tuesday evening she had red streaking up her leg.  Her foot is swollen.  And painful.  She describes the pain as a dull throbbing deep pain.  Rest makes it better.  Toradol offered no relief.  Walking makes it worse.  Patient is a diabetic but she is diet controlled.  She has had subjective fever and chills, but no measured fever.  Patient's only other complaint at this time is her headache.  She does have chronic headaches for which she is on Fioricet.  She has a headache now but no change in vision, no change in hearing.  Fluids helped the headache briefly, and then the headache came back.  Patient has no other complaints.   Clinical Impression  Patient functioning near baseline for functional mobility and gait other than slight limp on right foot due to soreness.  Patient demonstrates good return for ambulation in room and hallway without loss of balance and without requiring use of an AD.  Plan:  Patient discharged from physical therapy to care of nursing for ambulation daily as tolerated for length of stay.         Recommendations for follow up therapy are one component of a multi-disciplinary  discharge planning process, led by the attending physician.  Recommendations may be updated based on patient status, additional functional criteria and insurance authorization.  Follow Up Recommendations No PT follow up    Assistance Recommended at Discharge PRN  Patient can return home with the following  Help with stairs or ramp for entrance    Equipment Recommendations None recommended by PT  Recommendations for Other Services       Functional Status Assessment       Precautions / Restrictions Precautions Precautions: None Restrictions Weight Bearing Restrictions: No      Mobility  Bed Mobility Overal bed mobility: Independent                  Transfers Overall transfer level: Modified independent                      Ambulation/Gait Ambulation/Gait assistance: Modified independent (Device/Increase time) Gait Distance (Feet): 100 Feet Assistive device: None Gait Pattern/deviations: Decreased step length - right;Decreased stance time - right;Antalgic Gait velocity: decreased     General Gait Details: demonstrates good return for ambulation in room and hallways with slightly antalgic gait on right foot without loss of balance or requiring use of an AD  Stairs            Wheelchair Mobility    Modified Rankin (Stroke Patients Only)       Balance Overall balance assessment:  Mild deficits observed, not formally tested                                           Pertinent Vitals/Pain Pain Assessment: Faces Faces Pain Scale: Hurts a little bit Pain Location: right foot Pain Descriptors / Indicators: Sore Pain Intervention(s): Limited activity within patient's tolerance;Monitored during session;Repositioned    Home Living Family/patient expects to be discharged to:: Private residence Living Arrangements: Spouse/significant other Available Help at Discharge: Family;Available 24 hours/day Type of Home: House Home  Access: Stairs to enter Entrance Stairs-Rails: None (has 2 steps in kitchen with right siderail) Entrance Stairs-Number of Steps: 2   Home Layout: One level Home Equipment: Cane - single point      Prior Function Prior Level of Function : Independent/Modified Independent             Mobility Comments: Tourist information centre manager, drives ADLs Comments: Independent     Hand Dominance        Extremity/Trunk Assessment   Upper Extremity Assessment Upper Extremity Assessment: Overall WFL for tasks assessed    Lower Extremity Assessment Lower Extremity Assessment: Overall WFL for tasks assessed    Cervical / Trunk Assessment Cervical / Trunk Assessment: Normal  Communication   Communication: No difficulties  Cognition Arousal/Alertness: Awake/alert Behavior During Therapy: WFL for tasks assessed/performed Overall Cognitive Status: Within Functional Limits for tasks assessed                                          General Comments      Exercises     Assessment/Plan    PT Assessment Patient does not need any further PT services  PT Problem List         PT Treatment Interventions      PT Goals (Current goals can be found in the Care Plan section)  Acute Rehab PT Goals Patient Stated Goal: return home with family to assist PT Goal Formulation: With patient Time For Goal Achievement: 03/04/21 Potential to Achieve Goals: Good    Frequency       Co-evaluation               AM-PAC PT "6 Clicks" Mobility  Outcome Measure Help needed turning from your back to your side while in a flat bed without using bedrails?: None Help needed moving from lying on your back to sitting on the side of a flat bed without using bedrails?: None Help needed moving to and from a bed to a chair (including a wheelchair)?: None Help needed standing up from a chair using your arms (e.g., wheelchair or bedside chair)?: None Help needed to walk in hospital room?:  None Help needed climbing 3-5 steps with a railing? : A Little 6 Click Score: 23    End of Session   Activity Tolerance: Patient tolerated treatment well Patient left: in bed;with call bell/phone within reach Nurse Communication: Mobility status PT Visit Diagnosis: Unsteadiness on feet (R26.81);Other abnormalities of gait and mobility (R26.89);Muscle weakness (generalized) (M62.81)    Time: 1610-9604 PT Time Calculation (min) (ACUTE ONLY): 20 min   Charges:   PT Evaluation $PT Eval Moderate Complexity: 1 Mod PT Treatments $Therapeutic Activity: 8-22 mins        3:45 PM, 03/04/21 Ocie Bob, MPT Physical Therapist with  [facility] 336 [phone] office [phone] mobile phone

## 2021-03-05 DIAGNOSIS — E1169 Type 2 diabetes mellitus with other specified complication: Secondary | ICD-10-CM | POA: Diagnosis not present

## 2021-03-05 DIAGNOSIS — L03031 Cellulitis of right toe: Secondary | ICD-10-CM | POA: Diagnosis not present

## 2021-03-05 DIAGNOSIS — E669 Obesity, unspecified: Secondary | ICD-10-CM | POA: Diagnosis not present

## 2021-03-05 DIAGNOSIS — L03115 Cellulitis of right lower limb: Secondary | ICD-10-CM | POA: Diagnosis not present

## 2021-03-05 LAB — COMPREHENSIVE METABOLIC PANEL
ALT: 26 U/L (ref 0–44)
AST: 15 U/L (ref 15–41)
Albumin: 3.4 g/dL — ABNORMAL LOW (ref 3.5–5.0)
Alkaline Phosphatase: 54 U/L (ref 38–126)
Anion gap: 9 (ref 5–15)
BUN: 9 mg/dL (ref 8–23)
CO2: 22 mmol/L (ref 22–32)
Calcium: 8.9 mg/dL (ref 8.9–10.3)
Chloride: 106 mmol/L (ref 98–111)
Creatinine, Ser: 0.69 mg/dL (ref 0.44–1.00)
GFR, Estimated: >60 mL/min (ref >60)
Glucose, Bld: 141 mg/dL — ABNORMAL HIGH (ref 70–99)
Potassium: 4.1 mmol/L (ref 3.5–5.1)
Sodium: 137 mmol/L (ref 135–145)
Total Bilirubin: 0.5 mg/dL (ref 0.3–1.2)
Total Protein: 6.1 g/dL — ABNORMAL LOW (ref 6.5–8.1)

## 2021-03-05 LAB — CBC WITH DIFFERENTIAL/PLATELET
Abs Immature Granulocytes: 0.45 10*3/uL — ABNORMAL HIGH (ref 0.00–0.07)
Basophils Absolute: 0.1 10*3/uL (ref 0.0–0.1)
Basophils Relative: 1 %
Eosinophils Absolute: 0.3 10*3/uL (ref 0.0–0.5)
Eosinophils Relative: 2 %
HCT: 32.8 % — ABNORMAL LOW (ref 36.0–46.0)
Hemoglobin: 10.7 g/dL — ABNORMAL LOW (ref 12.0–15.0)
Immature Granulocytes: 3 %
Lymphocytes Relative: 18 %
Lymphs Abs: 2.8 10*3/uL (ref 0.7–4.0)
MCH: 30.3 pg (ref 26.0–34.0)
MCHC: 32.6 g/dL (ref 30.0–36.0)
MCV: 92.9 fL (ref 80.0–100.0)
Monocytes Absolute: 1.2 10*3/uL — ABNORMAL HIGH (ref 0.1–1.0)
Monocytes Relative: 8 %
Neutro Abs: 10.6 10*3/uL — ABNORMAL HIGH (ref 1.7–7.7)
Neutrophils Relative %: 68 %
Platelets: 273 10*3/uL (ref 150–400)
RBC: 3.53 MIL/uL — ABNORMAL LOW (ref 3.87–5.11)
RDW: 13.6 % (ref 11.5–15.5)
WBC: 15.5 10*3/uL — ABNORMAL HIGH (ref 4.0–10.5)
nRBC: 0 % (ref 0.0–0.2)

## 2021-03-05 LAB — MAGNESIUM: Magnesium: 2.1 mg/dL (ref 1.7–2.4)

## 2021-03-05 NOTE — Progress Notes (Signed)
[facility] PROGRESS NOTE    Progress Note  Christina Lewis  [id number] DOB: Jun 22, 1955 DOA: 03/04/2021 PCP: Monico Blitz, MD     Brief Narrative:   Christina Lewis is an 66 y.o. female past medical history of rheumatoid arthritis on Plaquenil, sleep apnea, polycystic ovarian syndrome, diabetes mellitus type 2 fibromyalgia comes into the ED with a chief complaint of cat related foot puncture, SARS-CoV-2 and PCR influenza were negative, the patient is a DNR afebrile in the ED white count of 21k hemoglobin of 13 platelet count of 300 right foot x-ray showed increased soft tissue edema she was started on IV Unasyn.    Assessment/Plan:   Right foot cellulitis: She is up-to-date on her tetanus shot. blood cultures have remained negative till date. Has remained afebrile leukocytosis improved. Erythema is receding she is able to move her great big toe better today. Continue IV vancomycin and Unasyn. Continue oxygen use Tylenol for fever  Fibromyalgia Continue Cymbalta.  Rheumatoid arthritis ([place]) Can resume Plaquenil as an outpatient.  Diabetes mellitus type 2 in obese (HCC) Last A1c about a year ago 5.7, has not received insulin continue sliding scale.  DVT prophylaxis: lovenox Family Communication:none Status is: Observation  The patient will require care spanning > 2 midnights and should be moved to inpatient because: Progressing right lower extremity cellulitis.   Code Status:     Code Status Orders  (From admission, onward)           Start     Ordered   03/04/21 0703  Do not attempt resuscitation (DNR)  Continuous       Question Answer Comment  In the event of cardiac or respiratory ARREST Do not call a code blue   In the event of cardiac or respiratory ARREST Do not perform Intubation, CPR, defibrillation or ACLS   In the event of cardiac or respiratory ARREST Use medication by any route, position, wound care, and other measures to relive pain and  suffering. May use oxygen, suction and manual treatment of airway obstruction as needed for comfort.      03/04/21 0702           Code Status History     Date Active Date Inactive Code Status Order ID Comments User Context   09/11/2019 1101 09/11/2019 1858 Full Code [id number]  Erline Levine, MD Inpatient         IV Access:   Peripheral IV   Procedures and diagnostic studies:   DG Foot Complete Right  Result Date: 03/04/2021 CLINICAL DATA:  Looking for metallic foreign body in the soft tissues, first MTP joint level, swelling and redness. EXAM: RIGHT FOOT COMPLETE - 3+ VIEW COMPARISON:  AP Lat right foot series 04/09/2020. FINDINGS: There is no evidence of fracture or dislocation. There is no evidence of arthropathy or other focal bone abnormality. Once again the MTP joints are hyperextended, which was also seen previously. There is moderate edema in the midfoot and forefoot, greater medially without evidence of radiopaque foreign body. Small posterior calcaneal enthesophyte is again shown. IMPRESSION: Increased soft tissue edema, but no radiopaque foreign body or appreciable soft tissue gas. No erosive bone lesion is seen. The toes are hyperextended which was also noted previously. Electronically Signed   By: Telford Nab M.D.   On: 03/04/2021 02:45     Medical Consultants:   None.   Subjective:    JAQUALA WHOOLERY pain is controlled, just when she gets up is painful.  Has not  had a bowel movement.   Objective:    Vitals:   03/04/21 1000 03/04/21 1817 03/04/21 2101 03/05/21 0416  BP: (!) 91/47 116/71 117/79 118/71  Pulse: 99 (!) 104 92 95  Resp: 18  18 18   Temp: 99.1 F (37.3 C)  98.8 F (37.1 C) 99.1 F (37.3 C)  TempSrc: Oral  Oral   SpO2: 94% 93% 97% 94%  Weight:      Height:       SpO2: 94 % O2 Flow Rate (L/min): 0 L/min FiO2 (%): 21 %   Intake/Output Summary (Last 24 hours) at 03/05/2021 0731 Last data filed at 03/05/2021 0527 Gross per 24 hour   Intake 1580.04 ml  Output --  Net 1580.04 ml   Filed Weights   03/04/21 0143  Weight: 78 kg    Exam: General exam: In no acute distress. Respiratory system: Good air movement and clear to auscultation. Cardiovascular system: S1 & S2 heard, RRR. No JVD. Gastrointestinal system: Abdomen is nondistended, soft and nontender.  Extremities: Her erythema has significantly receded, she is able to move her legs without any pain, she does relate ambulation bothers her, she is able to flex her great big toe which she could have not done yesterday she relates her swelling has significantly improved. Skin: No rashes, lesions or ulcers Psychiatry: Judgement and insight appear normal. Mood & affect appropriate.    Data Reviewed:    Labs: Basic Metabolic Panel: Recent Labs  Lab 03/04/21 0300 03/05/21 0412  NA 136 137  K 4.7 4.1  CL 104 106  CO2 24 22  GLUCOSE 174* 141*  BUN 18 9  CREATININE 0.85 0.69  CALCIUM 9.0 8.9  MG  --  2.1    GFR Estimated Creatinine Clearance: 71.8 mL/min (by C-G formula based on SCr of 0.69 mg/dL). Liver Function Tests: Recent Labs  Lab 03/04/21 0300 03/05/21 0412  AST 36 15  ALT 36 26  ALKPHOS 65 54  BILITOT 0.5 0.5  PROT 7.3 6.1*  ALBUMIN 4.3 3.4*    No results for input(s): LIPASE, AMYLASE in the last 168 hours. No results for input(s): AMMONIA in the last 168 hours. Coagulation profile No results for input(s): INR, PROTIME in the last 168 hours. COVID-19 Labs  No results for input(s): DDIMER, FERRITIN, LDH, CRP in the last 72 hours.  Lab Results  Component Value Date   SARSCOV2NAA NEGATIVE 03/04/2021   [place] NEGATIVE 12/05/2019   [place] NEGATIVE 11/30/2019   [place] NEGATIVE 09/07/2019    CBC: Recent Labs  Lab 03/04/21 0300 03/05/21 0412  WBC 21.7* 15.5*  NEUTROABS  --  10.6*  HGB 13.1 10.7*  HCT 38.7 32.8*  MCV 93.0 92.9  PLT 300 273    Cardiac Enzymes: No results for input(s): CKTOTAL, CKMB,  CKMBINDEX, TROPONINI in the last 168 hours. BNP (last 3 results) No results for input(s): PROBNP in the last 8760 hours. CBG: No results for input(s): GLUCAP in the last 168 hours. D-Dimer: No results for input(s): DDIMER in the last 72 hours. Hgb A1c: Recent Labs    03/04/21 0737  HGBA1C 6.7*   Lipid Profile: No results for input(s): CHOL, HDL, LDLCALC, TRIG, CHOLHDL, LDLDIRECT in the last 72 hours. Thyroid function studies: No results for input(s): TSH, T4TOTAL, T3FREE, THYROIDAB in the last 72 hours.  Invalid input(s): FREET3 Anemia work up: No results for input(s): VITAMINB12, FOLATE, FERRITIN, TIBC, IRON, RETICCTPCT in the last 72 hours. Sepsis Labs: Recent Labs  Lab 03/04/21 0300 03/05/21 [id number]  WBC 21.7* 15.5*    Microbiology Recent Results (from the past 240 hour(s))  Resp Panel by RT-PCR (Flu A&B, Covid) Nasopharyngeal Swab     Status: None   Collection Time: 03/04/21  3:55 AM   Specimen: Nasopharyngeal Swab; Nasopharyngeal(NP) swabs in vial transport medium  Result Value Ref Range Status   SARS Coronavirus 2 by RT PCR NEGATIVE NEGATIVE Final    Comment: (NOTE) SARS-CoV-2 target nucleic acids are NOT DETECTED.  The SARS-CoV-2 RNA is generally detectable in upper respiratory specimens during the acute phase of infection. The lowest concentration of SARS-CoV-2 viral copies this assay can detect is 138 copies/mL. A negative result does not preclude SARS-Cov-2 infection and should not be used as the sole basis for treatment or other patient management decisions. A negative result may occur with  improper specimen collection/handling, submission of specimen other than nasopharyngeal swab, presence of viral mutation(s) within the areas targeted by this assay, and inadequate number of viral copies(<138 copies/mL). A negative result must be combined with clinical observations, patient history, and epidemiological information. The expected result is Negative.  Fact  Sheet for Patients:  EntrepreneurPulse.com.au  Fact Sheet for Healthcare Providers:  IncredibleEmployment.be  This test is no t yet approved or cleared by the [place] FDA and  has been authorized for detection and/or diagnosis of SARS-CoV-2 by FDA under an Emergency Use Authorization (EUA). This EUA will remain  in effect (meaning this test can be used) for the duration of the COVID-19 declaration under Section 564(b)(1) of the Act, 21 U.S.C.section 360bbb-3(b)(1), unless the authorization is terminated  or revoked sooner.       Influenza A by PCR NEGATIVE NEGATIVE Final   Influenza B by PCR NEGATIVE NEGATIVE Final    Comment: (NOTE) The Xpert Xpress SARS-CoV-2/FLU/RSV plus assay is intended as an aid in the diagnosis of influenza from Nasopharyngeal swab specimens and should not be used as a sole basis for treatment. Nasal washings and aspirates are unacceptable for Xpert Xpress SARS-CoV-2/FLU/RSV testing.  Fact Sheet for Patients: EntrepreneurPulse.com.au  Fact Sheet for Healthcare Providers: IncredibleEmployment.be  This test is not yet approved or cleared by the [place] FDA and has been authorized for detection and/or diagnosis of SARS-CoV-2 by FDA under an Emergency Use Authorization (EUA). This EUA will remain in effect (meaning this test can be used) for the duration of the COVID-19 declaration under Section 564(b)(1) of the Act, 21 U.S.C. section 360bbb-3(b)(1), unless the authorization is terminated or revoked.  Performed at [facility], [address] [place], [place] [address]   Culture, blood (routine x 2)     Status: None (Preliminary result)   Collection Time: 03/04/21  7:34 AM   Specimen: Left Antecubital; Blood  Result Value Ref Range Status   Specimen Description LEFT ANTECUBITAL  Final   Special Requests   Final    BOTTLES DRAWN AEROBIC AND ANAEROBIC Blood Culture  adequate volume Performed at [facility], [address] [place], [place] [address]    Culture PENDING  Incomplete   Report Status PENDING  Incomplete  Culture, blood (routine x 2)     Status: None (Preliminary result)   Collection Time: 03/04/21  7:37 AM   Specimen: Right Antecubital; Blood  Result Value Ref Range Status   Specimen Description RIGHT ANTECUBITAL  Final   Special Requests   Final    BOTTLES DRAWN AEROBIC AND ANAEROBIC Blood Culture results may not be optimal due to an excessive volume of blood received in culture bottles  Performed at [facility], [address], [place], [place] [address]    Culture PENDING  Incomplete   Report Status PENDING  Incomplete     Medications:    aspirin EC  81 mg Oral Daily   DULoxetine  60 mg Oral BID   heparin  5,000 Units Subcutaneous Q8H   loratadine  10 mg Oral QHS   polyethylene glycol  17 g Oral Daily   Continuous Infusions:  ampicillin-sulbactam (UNASYN) IV 3 g (03/05/21 0341)   vancomycin Stopped (03/04/21 2226)      LOS: 1 day   Charlynne Cousins  [facility]  03/05/2021, 7:31 AM

## 2021-03-05 NOTE — Progress Notes (Signed)
Pt complaining of worsening toe pain, swelling, and redness. Sent MD message. Awaiting response.

## 2021-03-06 DIAGNOSIS — M069 Rheumatoid arthritis, unspecified: Secondary | ICD-10-CM | POA: Diagnosis not present

## 2021-03-06 DIAGNOSIS — E669 Obesity, unspecified: Secondary | ICD-10-CM | POA: Diagnosis not present

## 2021-03-06 DIAGNOSIS — E1169 Type 2 diabetes mellitus with other specified complication: Secondary | ICD-10-CM | POA: Diagnosis not present

## 2021-03-06 DIAGNOSIS — L03031 Cellulitis of right toe: Secondary | ICD-10-CM | POA: Diagnosis not present

## 2021-03-06 MED ORDER — AMOXICILLIN-POT CLAVULANATE 875-125 MG PO TABS: 1.0000 | ORAL_TABLET | Freq: Two times a day (BID) | ORAL | 0 refills | Status: AC

## 2021-03-06 MED ORDER — DOXYCYCLINE HYCLATE 100 MG PO TABS: 100.0000 mg | ORAL_TABLET | Freq: Two times a day (BID) | ORAL | 0 refills | Status: AC

## 2021-03-06 NOTE — Progress Notes (Signed)
Pt has discharge orders, discharge teaching given and no further questions at this time. Pt to be wheeled down to front lobby to meet husband when IV abt completed.

## 2021-03-06 NOTE — Discharge Summary (Signed)
Physician Discharge Summary  Christina Lewis [id number] DOB: 07/01/1955 DOA: 03/04/2021  PCP: Monico Blitz, MD  Admit date: 03/04/2021 Discharge date: 03/06/2021  Admitted From: Home Disposition:  Home  Recommendations for Outpatient Follow-up:  Follow up with PCP in 1-2 weeks Please obtain BMP/CBC in one week   Home Health:None Equipment/Devices:No  Discharge Condition:Stable CODE STATUS:Full Diet recommendation: Heart Healthy  Brief/Interim Summary: 66 y.o. female past medical history of rheumatoid arthritis on Plaquenil, sleep apnea, polycystic ovarian syndrome, diabetes mellitus type 2 fibromyalgia comes into the ED with a chief complaint of cat related foot puncture, SARS-CoV-2 and PCR influenza were negative, the patient is a DNR afebrile in the ED white count of 21k hemoglobin of 13 platelet count of 300 right foot x-ray showed increased soft tissue edema she was started on IV Unasyn.    Discharge Diagnoses:  Principal Problem:   Cellulitis Active Problems:   Fibromyalgia   Rheumatoid arthritis ([place])   Diabetes mellitus type 2 in obese (HCC)   Right foot infection  Right foot cellulitis: She is up-to-date on her tetanus shot, blood cultures remain negative till date. She started empirically on IV vancomycin and Unasyn she defervesced her leukocytosis improved. She was changed to oral Doxy and Augmentin which she will continue for 7 days as an outpatient. Follow-up with her PCP in 2 weeks  Fibromyalgia: Continue Cymbalta.  Rheumatoid arthritis: Can resume Plaquenil as an outpatient.  Diabetes mellitus type 2: With an A1c of 5.7 she required no insulin during her hospital stay.  Discharge Instructions  Discharge Instructions     Diet - low sodium heart healthy   Complete by: As directed    Increase activity slowly   Complete by: As directed       Allergies as of 03/06/2021       Reactions   Sulfa Antibiotics Anaphylaxis   Codeine Itching    Erythromycin Nausea And Vomiting   Metronidazole    Diarrhea    Morphine And Related Itching        Medication List     STOP taking these medications    ferrous sulfate 325 (65 FE) MG tablet       TAKE these medications    amoxicillin-clavulanate 875-125 MG tablet Commonly known as: Augmentin Take 1 tablet by mouth 2 (two) times daily for 7 days.   aspirin EC 81 MG tablet Take 81 mg by mouth daily. Swallow whole.   Azelastine-Fluticasone 137-50 MCG/ACT Susp Place 1 spray into the nose every 12 (twelve) hours.   baclofen 10 MG tablet Commonly known as: LIORESAL TAKE 1 TABLET EVERY MORNING AND 1 TABLET AT NOON. What changed: See the new instructions.   benzonatate 100 MG capsule Commonly known as: TESSALON Take 1-2 capsules (100-200 mg total) by mouth 2 (two) times daily as needed for cough.   butalbital-acetaminophen-caffeine 50-325-40 MG tablet Commonly known as: FIORICET Take 1-2 tablets by mouth 2 (two) times daily as needed for migraine.   cholecalciferol 25 MCG (1000 UNIT) tablet Commonly known as: VITAMIN D Take 1,000 Units by mouth daily.   diclofenac Sodium 1 % Gel Commonly known as: VOLTAREN APPLY 2 TO 4 GRAMS TO AFFECTED JOINTS UP TO 4 TIMES DAILY AS NEEDED. What changed:  how much to take how to take this when to take this reasons to take this additional instructions   doxycycline 100 MG tablet Commonly known as: VIBRA-TABS Take 1 tablet (100 mg total) by mouth 2 (two) times daily for 7 days.  DULoxetine 60 MG capsule Commonly known as: CYMBALTA Take 120 mg by mouth daily.   gabapentin 300 MG capsule Commonly known as: NEURONTIN Take 600 mg by mouth 2 (two) times daily.   hydroxychloroquine 200 MG tablet Commonly known as: PLAQUENIL TAKE 1 TABLET TWICE DAILY-MONDAY THRU FRIDAY FOR RHEUMATOID ARTHRITIS. What changed: See the new instructions.   levocetirizine 5 MG tablet Commonly known as: XYZAL Take 5 mg by mouth at bedtime.    MAGNESIUM PO Take 500 mg by mouth daily.   montelukast 10 MG tablet Commonly known as: SINGULAIR Take 10 mg by mouth daily.   multivitamin with minerals Tabs tablet Take 1 tablet by mouth daily.   ondansetron 8 MG disintegrating tablet Commonly known as: ZOFRAN-ODT Take 8 mg by mouth every 8 (eight) hours as needed for nausea.   Qulipta 60 MG Tabs Generic drug: Atogepant Take 1 tablet by mouth daily.   tiZANidine 4 MG tablet Commonly known as: ZANAFLEX TAKE 1 TABLET AT BEDTIME AS NEEDED. What changed: reasons to take this   TURMERIC-GINGER PO Take 1 tablet by mouth daily.        Allergies  Allergen Reactions   Sulfa Antibiotics Anaphylaxis   Codeine Itching   Erythromycin Nausea And Vomiting   Metronidazole     Diarrhea    Morphine And Related Itching    Consultations: None   Procedures/Studies: DG Foot Complete Right  Result Date: 03/04/2021 CLINICAL DATA:  Looking for metallic foreign body in the soft tissues, first MTP joint level, swelling and redness. EXAM: RIGHT FOOT COMPLETE - 3+ VIEW COMPARISON:  AP Lat right foot series 04/09/2020. FINDINGS: There is no evidence of fracture or dislocation. There is no evidence of arthropathy or other focal bone abnormality. Once again the MTP joints are hyperextended, which was also seen previously. There is moderate edema in the midfoot and forefoot, greater medially without evidence of radiopaque foreign body. Small posterior calcaneal enthesophyte is again shown. IMPRESSION: Increased soft tissue edema, but no radiopaque foreign body or appreciable soft tissue gas. No erosive bone lesion is seen. The toes are hyperextended which was also noted previously. Electronically Signed   By: Telford Nab M.D.   On: 03/04/2021 02:45    Subjective: No complaints  Discharge Exam: Vitals:   03/05/21 2112 03/06/21 0616  BP: 112/76 112/69  Pulse: 83 81  Resp:    Temp: 98.4 F (36.9 C) 98.3 F (36.8 C)  SpO2: 96% 95%    Vitals:   03/05/21 0416 03/05/21 1301 03/05/21 2112 03/06/21 0616  BP: 118/71 109/66 112/76 112/69  Pulse: 95 89 83 81  Resp: 18 20    Temp: 99.1 F (37.3 C) 98.1 F (36.7 C) 98.4 F (36.9 C) 98.3 F (36.8 C)  TempSrc:  Oral Oral   SpO2: 94% 98% 96% 95%  Weight:      Height:        General: Pt is alert, awake, not in acute distress Cardiovascular: RRR, S1/S2 +, no rubs, no gallops Respiratory: CTA bilaterally, no wheezing, no rhonchi Abdominal: Soft, NT, ND, bowel sounds + Extremities: no edema, no cyanosis    The results of significant diagnostics from this hospitalization (including imaging, microbiology, ancillary and laboratory) are listed below for reference.     Microbiology: Recent Results (from the past 240 hour(s))  Resp Panel by RT-PCR (Flu A&B, Covid) Nasopharyngeal Swab     Status: None   Collection Time: 03/04/21  3:55 AM   Specimen: Nasopharyngeal Swab; Nasopharyngeal(NP) swabs in  vial transport medium  Result Value Ref Range Status   SARS Coronavirus 2 by RT PCR NEGATIVE NEGATIVE Final    Comment: (NOTE) SARS-CoV-2 target nucleic acids are NOT DETECTED.  The SARS-CoV-2 RNA is generally detectable in upper respiratory specimens during the acute phase of infection. The lowest concentration of SARS-CoV-2 viral copies this assay can detect is 138 copies/mL. A negative result does not preclude SARS-Cov-2 infection and should not be used as the sole basis for treatment or other patient management decisions. A negative result may occur with  improper specimen collection/handling, submission of specimen other than nasopharyngeal swab, presence of viral mutation(s) within the areas targeted by this assay, and inadequate number of viral copies(<138 copies/mL). A negative result must be combined with clinical observations, patient history, and epidemiological information. The expected result is Negative.  Fact Sheet for Patients:   EntrepreneurPulse.com.au  Fact Sheet for Healthcare Providers:  IncredibleEmployment.be  This test is no t yet approved or cleared by the [place] FDA and  has been authorized for detection and/or diagnosis of SARS-CoV-2 by FDA under an Emergency Use Authorization (EUA). This EUA will remain  in effect (meaning this test can be used) for the duration of the COVID-19 declaration under Section 564(b)(1) of the Act, 21 U.S.C.section 360bbb-3(b)(1), unless the authorization is terminated  or revoked sooner.       Influenza A by PCR NEGATIVE NEGATIVE Final   Influenza B by PCR NEGATIVE NEGATIVE Final    Comment: (NOTE) The Xpert Xpress SARS-CoV-2/FLU/RSV plus assay is intended as an aid in the diagnosis of influenza from Nasopharyngeal swab specimens and should not be used as a sole basis for treatment. Nasal washings and aspirates are unacceptable for Xpert Xpress SARS-CoV-2/FLU/RSV testing.  Fact Sheet for Patients: EntrepreneurPulse.com.au  Fact Sheet for Healthcare Providers: IncredibleEmployment.be  This test is not yet approved or cleared by the [place] FDA and has been authorized for detection and/or diagnosis of SARS-CoV-2 by FDA under an Emergency Use Authorization (EUA). This EUA will remain in effect (meaning this test can be used) for the duration of the COVID-19 declaration under Section 564(b)(1) of the Act, 21 U.S.C. section 360bbb-3(b)(1), unless the authorization is terminated or revoked.  Performed at [facility], [address] [place], [place] [address]   Culture, blood (routine x 2)     Status: None (Preliminary result)   Collection Time: 03/04/21  7:34 AM   Specimen: Left Antecubital; Blood  Result Value Ref Range Status   Specimen Description LEFT ANTECUBITAL  Final   Special Requests   Final    BOTTLES DRAWN AEROBIC AND ANAEROBIC Blood Culture adequate volume   Culture    Final    NO GROWTH 2 DAYS Performed at [facility], [address], [place], [place] [address]    Report Status PENDING  Incomplete  Culture, blood (routine x 2)     Status: None (Preliminary result)   Collection Time: 03/04/21  7:37 AM   Specimen: Right Antecubital; Blood  Result Value Ref Range Status   Specimen Description RIGHT ANTECUBITAL  Final   Special Requests   Final    BOTTLES DRAWN AEROBIC AND ANAEROBIC Blood Culture results may not be optimal due to an excessive volume of blood received in culture bottles   Culture   Final    NO GROWTH 2 DAYS Performed at [facility], [address], [place], [place] [address]    Report Status PENDING  Incomplete     Labs: BNP (last  3 results) No results for input(s): BNP in the last 8760 hours. Basic Metabolic Panel: Recent Labs  Lab 03/04/21 0300 03/05/21 0412  NA 136 137  K 4.7 4.1  CL 104 106  CO2 24 22  GLUCOSE 174* 141*  BUN 18 9  CREATININE 0.85 0.69  CALCIUM 9.0 8.9  MG  --  2.1   Liver Function Tests: Recent Labs  Lab 03/04/21 0300 03/05/21 0412  AST 36 15  ALT 36 26  ALKPHOS 65 54  BILITOT 0.5 0.5  PROT 7.3 6.1*  ALBUMIN 4.3 3.4*   No results for input(s): LIPASE, AMYLASE in the last 168 hours. No results for input(s): AMMONIA in the last 168 hours. CBC: Recent Labs  Lab 03/04/21 0300 03/05/21 0412  WBC 21.7* 15.5*  NEUTROABS  --  10.6*  HGB 13.1 10.7*  HCT 38.7 32.8*  MCV 93.0 92.9  PLT 300 273   Cardiac Enzymes: No results for input(s): CKTOTAL, CKMB, CKMBINDEX, TROPONINI in the last 168 hours. BNP: Invalid input(s): POCBNP CBG: No results for input(s): GLUCAP in the last 168 hours. D-Dimer No results for input(s): DDIMER in the last 72 hours. Hgb A1c Recent Labs    03/04/21 0737  HGBA1C 6.7*   Lipid Profile No results for input(s): CHOL, HDL, LDLCALC, TRIG, CHOLHDL, LDLDIRECT in the last 72 hours. Thyroid function studies No results for input(s): TSH, T4TOTAL, T3FREE,  THYROIDAB in the last 72 hours.  Invalid input(s): FREET3 Anemia work up No results for input(s): VITAMINB12, FOLATE, FERRITIN, TIBC, IRON, RETICCTPCT in the last 72 hours. Urinalysis    Component Value Date/Time   COLORURINE YELLOW 09/11/2020 1232   APPEARANCEUR CLEAR 09/11/2020 1232   LABSPEC 1.020 09/11/2020 1232   PHURINE 6.0 09/11/2020 1232   GLUCOSEU NEGATIVE 09/11/2020 1232   HGBUR SMALL (A) 09/11/2020 1232   BILIRUBINUR NEGATIVE 09/11/2020 1232   KETONESUR NEGATIVE 09/11/2020 1232   PROTEINUR NEGATIVE 09/11/2020 1232   UROBILINOGEN 0.2 09/21/2011 2323   NITRITE NEGATIVE 09/11/2020 1232   LEUKOCYTESUR TRACE (A) 09/11/2020 1232   Sepsis Labs Invalid input(s): PROCALCITONIN,  WBC,  LACTICIDVEN Microbiology Recent Results (from the past 240 hour(s))  Resp Panel by RT-PCR (Flu A&B, Covid) Nasopharyngeal Swab     Status: None   Collection Time: 03/04/21  3:55 AM   Specimen: Nasopharyngeal Swab; Nasopharyngeal(NP) swabs in vial transport medium  Result Value Ref Range Status   SARS Coronavirus 2 by RT PCR NEGATIVE NEGATIVE Final    Comment: (NOTE) SARS-CoV-2 target nucleic acids are NOT DETECTED.  The SARS-CoV-2 RNA is generally detectable in upper respiratory specimens during the acute phase of infection. The lowest concentration of SARS-CoV-2 viral copies this assay can detect is 138 copies/mL. A negative result does not preclude SARS-Cov-2 infection and should not be used as the sole basis for treatment or other patient management decisions. A negative result may occur with  improper specimen collection/handling, submission of specimen other than nasopharyngeal swab, presence of viral mutation(s) within the areas targeted by this assay, and inadequate number of viral copies(<138 copies/mL). A negative result must be combined with clinical observations, patient history, and epidemiological information. The expected result is Negative.  Fact Sheet for Patients:   EntrepreneurPulse.com.au  Fact Sheet for Healthcare Providers:  IncredibleEmployment.be  This test is no t yet approved or cleared by the [place] FDA and  has been authorized for detection and/or diagnosis of SARS-CoV-2 by FDA under an Emergency Use Authorization (EUA). This EUA will remain  in effect (meaning  this test can be used) for the duration of the COVID-19 declaration under Section 564(b)(1) of the Act, 21 U.S.C.section 360bbb-3(b)(1), unless the authorization is terminated  or revoked sooner.       Influenza A by PCR NEGATIVE NEGATIVE Final   Influenza B by PCR NEGATIVE NEGATIVE Final    Comment: (NOTE) The Xpert Xpress SARS-CoV-2/FLU/RSV plus assay is intended as an aid in the diagnosis of influenza from Nasopharyngeal swab specimens and should not be used as a sole basis for treatment. Nasal washings and aspirates are unacceptable for Xpert Xpress SARS-CoV-2/FLU/RSV testing.  Fact Sheet for Patients: EntrepreneurPulse.com.au  Fact Sheet for Healthcare Providers: IncredibleEmployment.be  This test is not yet approved or cleared by the [place] FDA and has been authorized for detection and/or diagnosis of SARS-CoV-2 by FDA under an Emergency Use Authorization (EUA). This EUA will remain in effect (meaning this test can be used) for the duration of the COVID-19 declaration under Section 564(b)(1) of the Act, 21 U.S.C. section 360bbb-3(b)(1), unless the authorization is terminated or revoked.  Performed at [facility], [address], [place], [place] [address]   Culture, blood (routine x 2)     Status: None (Preliminary result)   Collection Time: 03/04/21  7:34 AM   Specimen: Left Antecubital; Blood  Result Value Ref Range Status   Specimen Description LEFT ANTECUBITAL  Final   Special Requests   Final    BOTTLES DRAWN AEROBIC AND ANAEROBIC Blood Culture adequate volume   Culture    Final    NO GROWTH 2 DAYS Performed at [facility], [address] [place], [place] [address]    Report Status PENDING  Incomplete  Culture, blood (routine x 2)     Status: None (Preliminary result)   Collection Time: 03/04/21  7:37 AM   Specimen: Right Antecubital; Blood  Result Value Ref Range Status   Specimen Description RIGHT ANTECUBITAL  Final   Special Requests   Final    BOTTLES DRAWN AEROBIC AND ANAEROBIC Blood Culture results may not be optimal due to an excessive volume of blood received in culture bottles   Culture   Final    NO GROWTH 2 DAYS Performed at [facility], [address] [place], [place] [address]    Report Status PENDING  Incomplete      SIGNED:   Charlynne Cousins, MD  [facility] 03/06/2021, 8:13 AM Pager   If 7PM-7AM, please contact night-coverage [email or web address] Password [email or web address]

## 2021-03-10 LAB — CULTURE, BLOOD (ROUTINE X 2)
Culture: NO GROWTH
Culture: NO GROWTH
Special Requests: ADEQUATE

## 2021-03-23 NOTE — Progress Notes (Deleted)
Office Visit Note  Patient: Christina Lewis             Date of Birth: 27-Feb-1955           MRN: [id number]             PCP: Monico Blitz, MD Referring: Monico Blitz, MD Visit Date: 03/24/2021 Occupation: @GUAROCC @  Subjective:    History of Present Illness: Christina Lewis is a 66 y.o. female with history of seronegative rheumatoid arthritis, fibromyalgia, and osteoarthritis.   She is taking plaquenil 200 mg 1 tablet by mouth twice daily.   Plaquenil eye exam on 02/12/21? CBC and CMP updated on 03/05/21.  She will continue to require updated lab work every 5 months to monitor for drug toxicity.   Activities of Daily Living:  Patient reports morning stiffness for *** {minute/hour:19697}.   Patient {ACTIONS;DENIES/REPORTS:21021675::"Denies"} nocturnal pain.  Difficulty dressing/grooming: {ACTIONS;DENIES/REPORTS:21021675::"Denies"} Difficulty climbing stairs: {ACTIONS;DENIES/REPORTS:21021675::"Denies"} Difficulty getting out of chair: {ACTIONS;DENIES/REPORTS:21021675::"Denies"} Difficulty using hands for taps, buttons, cutlery, and/or writing: {ACTIONS;DENIES/REPORTS:21021675::"Denies"}  No Rheumatology ROS completed.   PMFS History:  Patient Active Problem List   Diagnosis Date Noted   Cellulitis 03/04/2021   Rheumatoid arthritis ([place]) 03/04/2021   Diabetes mellitus type 2 in obese ([place]) 03/04/2021   Right foot infection 03/04/2021   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 myelopathy 09/11/2019   Leukocytosis 11/04/2016   Fibromyalgia 02/10/2016   Other fatigue 02/10/2016   Primary osteoarthritis of both hands 02/10/2016   History of migraine 02/10/2016   History of thyroid nodule 02/10/2016   History of sleep apnea 02/10/2016   History of renal calculi 02/10/2016   Pain in joint, shoulder region 10/02/2010   Adhesive capsulitis of shoulder 10/02/2010   Muscle weakness (generalized) 10/02/2010    Past Medical History:  Diagnosis Date   Arthritis    Asthma 2007   Depression     Diabetes mellitus    Fibromyalgia    GERD (gastroesophageal reflux disease)    Gout    Hematuria    History of kidney stones    History of tics    Interstitial cystitis 2016   Migraines    Osteoarthritis    PCOS (polycystic ovarian syndrome) 1993   Pneumonia 2019   PONV (postoperative nausea and vomiting)    Sleep apnea    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, took medication for a year   Thyroid nodule     Family History  Problem Relation Age of Onset   Fibromyalgia Mother    Psoriasis Mother        psoriatic arthritis    Diabetes Mother    Heart disease Mother    Psoriasis Sister        psoriatic arthritis    Diabetes Sister    Rheum arthritis Sister    Fibromyalgia Sister    Diabetes Sister    Past Surgical History:  Procedure Laterality Date   ABDOMINAL HYSTERECTOMY  2000   APPENDECTOMY  1976   BREAST LUMPECTOMY  1989   lt-negative   CARDIOVASCULAR STRESS TEST  2000   CATARACT EXTRACTION W/PHACO Left 12/07/2019   Procedure: CATARACT EXTRACTION PHACO AND INTRAOCULAR LENS PLACEMENT ([place]);  Surgeon: Baruch Goldmann, MD;  Location: [facility];  Service: Ophthalmology;  Laterality: Left;  CDE: 5.55   CHOLECYSTECTOMY  1985   COLONOSCOPY     HYSTERECTOMY ABDOMINAL WITH SALPINGECTOMY Bilateral 2000   KIDNEY STONE SURGERY Right 2014   with stent placement in OR   KNEE ARTHROSCOPY  Right 08/22/2013   Procedure: ARTHROSCOPY RIGHT KNEE FOR INFECTION LAVAGE AND DRAINAGE;  Surgeon: Yvette Rack., MD;  Location: [place];  Service: Orthopedics;  Laterality: Right;   KNEE ARTHROSCOPY WITH PATELLA RECONSTRUCTION Right 08/06/2013   Procedure: RIGHT KNEE ARTHROSCOPY WITH MENISCECTOMY MEDIAL, ARTHROSCOPY KNEE WITH DEBRIDEMENT/SHAVING (CHONDROPLASTY) ;  Surgeon: Yvette Rack., MD;  Location: [place];  Service: Orthopedics;  Laterality: Right;   LUMBAR LAMINECTOMY/DECOMPRESSION MICRODISCECTOMY Right 09/11/2019   Procedure: Right  Lumbar Three-Four Microdiscectomy;  Surgeon: Erline Levine, MD;  Location: [place];  Service: Neurosurgery;  Laterality: Right;  Right Lumbar Three-Four Microdiscectomy   NASAL SINUS SURGERY     x4   OOPHORECTOMY     SHOULDER SURGERY  2011   right shoulder   THYROID SURGERY  1994   TONSILLECTOMY Bilateral 1974   Social History   Social History Narrative   Not on file   Immunization History  Administered Date(s) Administered   Tdap 03/04/2021     Objective: Vital Signs: There were no vitals taken for this visit.   Physical Exam Vitals and nursing note reviewed.  Constitutional:      Appearance: She is well-developed.  HENT:     Head: Normocephalic and atraumatic.  Eyes:     Conjunctiva/sclera: Conjunctivae normal.  Pulmonary:     Effort: Pulmonary effort is normal.  Abdominal:     Palpations: Abdomen is soft.  Musculoskeletal:     Cervical back: Normal range of motion.  Skin:    General: Skin is warm and dry.     Capillary Refill: Capillary refill takes less than 2 seconds.  Neurological:     Mental Status: She is alert and oriented to person, place, and time.  Psychiatric:        Behavior: Behavior normal.     Musculoskeletal Exam: ***  CDAI Exam: CDAI Score: -- Patient Global: --; Provider Global: -- Swollen: --; Tender: -- Joint Exam 03/24/2021   No joint exam has been documented for this visit   There is currently no information documented on the homunculus. Go to the Rheumatology activity and complete the homunculus joint exam.  Investigation: No additional findings.  Imaging: DG Foot Complete Right  Result Date: 03/04/2021 CLINICAL DATA:  Looking for metallic foreign body in the soft tissues, first MTP joint level, swelling and redness. EXAM: RIGHT FOOT COMPLETE - 3+ VIEW COMPARISON:  AP Lat right foot series 04/09/2020. FINDINGS: There is no evidence of fracture or dislocation. There is no evidence of arthropathy or other focal bone abnormality. Once  again the MTP joints are hyperextended, which was also seen previously. There is moderate edema in the midfoot and forefoot, greater medially without evidence of radiopaque foreign body. Small posterior calcaneal enthesophyte is again shown. IMPRESSION: Increased soft tissue edema, but no radiopaque foreign body or appreciable soft tissue gas. No erosive bone lesion is seen. The toes are hyperextended which was also noted previously. Electronically Signed   By: Telford Nab M.D.   On: 03/04/2021 02:45    Recent Labs: Lab Results  Component Value Date   WBC 15.5 (H) 03/05/2021   HGB 10.7 (L) 03/05/2021   PLT 273 03/05/2021   NA 137 03/05/2021   K 4.1 03/05/2021   CL 106 03/05/2021   CO2 22 03/05/2021   GLUCOSE 141 (H) 03/05/2021   BUN 9 03/05/2021   CREATININE 0.69 03/05/2021   BILITOT 0.5 03/05/2021   ALKPHOS 54 03/05/2021   AST 15  03/05/2021   ALT 26 03/05/2021   PROT 6.1 (L) 03/05/2021   ALBUMIN 3.4 (L) 03/05/2021   CALCIUM 8.9 03/05/2021   GFRAA 98 03/24/2020    Speciality Comments: PLQ Eye Exam: 07/31/2019 @ [facility] [place] eye exam scheduled for 02/12/2021.  Procedures:  No procedures performed Allergies: Sulfa antibiotics, Codeine, Erythromycin, Metronidazole, and Morphine and related   Assessment / Plan:     Visit Diagnoses: No diagnosis found.  Orders: No orders of the defined types were placed in this encounter.  No orders of the defined types were placed in this encounter.   Face-to-face time spent with patient was *** minutes. Greater than 50% of time was spent in counseling and coordination of care.  Follow-Up Instructions: No follow-ups on file.   Bo Merino, MD  Note - This record has been created using Editor, commissioning.  Chart creation errors have been sought, but may not always  have been located. Such creation errors do not reflect on  the standard of medical care.

## 2021-03-24 ENCOUNTER — Ambulatory Visit: Payer: Medicare Other | Admitting: Physician Assistant

## 2021-03-24 DIAGNOSIS — M7061 Trochanteric bursitis, right hip: Secondary | ICD-10-CM

## 2021-03-24 DIAGNOSIS — M0609 Rheumatoid arthritis without rheumatoid factor, multiple sites: Secondary | ICD-10-CM

## 2021-03-24 DIAGNOSIS — Z79899 Other long term (current) drug therapy: Secondary | ICD-10-CM

## 2021-03-24 DIAGNOSIS — Z8659 Personal history of other mental and behavioral disorders: Secondary | ICD-10-CM

## 2021-03-24 DIAGNOSIS — M1711 Unilateral primary osteoarthritis, right knee: Secondary | ICD-10-CM

## 2021-03-24 DIAGNOSIS — R5383 Other fatigue: Secondary | ICD-10-CM

## 2021-03-24 DIAGNOSIS — Z8639 Personal history of other endocrine, nutritional and metabolic disease: Secondary | ICD-10-CM

## 2021-03-24 DIAGNOSIS — Z9289 Personal history of other medical treatment: Secondary | ICD-10-CM

## 2021-03-24 DIAGNOSIS — Z87442 Personal history of urinary calculi: Secondary | ICD-10-CM

## 2021-03-24 DIAGNOSIS — M19071 Primary osteoarthritis, right ankle and foot: Secondary | ICD-10-CM

## 2021-03-24 DIAGNOSIS — Z8669 Personal history of other diseases of the nervous system and sense organs: Secondary | ICD-10-CM

## 2021-03-24 DIAGNOSIS — M19041 Primary osteoarthritis, right hand: Secondary | ICD-10-CM

## 2021-03-24 DIAGNOSIS — G8929 Other chronic pain: Secondary | ICD-10-CM

## 2021-03-24 DIAGNOSIS — M5136 Other intervertebral disc degeneration, lumbar region: Secondary | ICD-10-CM

## 2021-03-24 DIAGNOSIS — Z8719 Personal history of other diseases of the digestive system: Secondary | ICD-10-CM

## 2021-03-24 DIAGNOSIS — M797 Fibromyalgia: Secondary | ICD-10-CM

## 2021-03-24 NOTE — Progress Notes (Signed)
Office Visit Note  Patient: Christina Lewis             Date of Birth: [birth year]           MRN: [id number]             PCP: Monico Blitz, MD Referring: Monico Blitz, MD Visit Date: 04/07/2021 Occupation: @GUAROCC @  Subjective:  Cellulitis   History of Present Illness: Christina Lewis is a 66 y.o. female with history of seronegative rheumatoid arthritis, osteoarthritis, and fibromyalgia. She is taking plaquenil 200 mg 1 tablet by mouth twice daily Monday through Friday.  She is tolerating Plaquenil without any side effects.  She states that she was recently admitted to the hospital for 4 days starting on 03/02/2021 for management of cellulitis.  She states that while hospitalized she was holding Plaquenil but has since resumed therapy as prescribed.  She received IV antibiotics while admitted and was discharged home with oral antibiotics.  She has required an additional course of doxycycline prescribed by her PCP.  She continues to have some redness of the right great toe. She continues to have low grade fevers intermittently.  She has been experiencing increased neck pain and stiffness over the past couple of months.  She is having trapezius muscle tension and tenderness bilaterally.  She started to have more frequent tension headaches. She has generalized myalgias and muscle tenderness due to fibromyalgia.  She has ongoing fatigue on a daily basis.  She has been taking meloxicam 15 mg daily for the past 4 to 6 weeks for pain relief.  She remains on cymbalta, baclofen, and gabapentin as prescribed.     Activities of Daily Living:  Patient reports morning stiffness for all day. Patient Reports nocturnal pain.  Difficulty dressing/grooming: Denies Difficulty climbing stairs: Reports Difficulty getting out of chair: Reports Difficulty using hands for taps, buttons, cutlery, and/or writing: Reports  Review of Systems  Constitutional:  Positive for fatigue.  HENT:  Positive for mouth sores  and nose dryness. Negative for mouth dryness.   Eyes:  Positive for pain, itching and dryness.  Respiratory:  Negative for shortness of breath and difficulty breathing.   Cardiovascular:  Negative for chest pain and palpitations.  Gastrointestinal:  Negative for blood in stool, constipation and diarrhea.  Endocrine: Negative for increased urination.  Genitourinary:  Negative for difficulty urinating.  Musculoskeletal:  Positive for joint pain, joint pain, joint swelling, myalgias, morning stiffness, muscle tenderness and myalgias.  Skin:  Positive for color change. Negative for rash and redness.  Allergic/Immunologic: Negative for susceptible to infections.  Neurological:  Positive for dizziness, headaches and weakness. Negative for numbness and memory loss.  Hematological:  Negative for bruising/bleeding tendency.  Psychiatric/Behavioral:  Negative for confusion.    PMFS History:  Patient Active Problem List   Diagnosis Date Noted   Cellulitis 03/04/2021   Rheumatoid arthritis ([place]) 03/04/2021   Diabetes mellitus type 2 in obese ([place]) 03/04/2021   Right foot infection 03/04/2021   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 myelopathy 09/11/2019   Leukocytosis 11/04/2016   Fibromyalgia 02/10/2016   Other fatigue 02/10/2016   Primary osteoarthritis of both hands 02/10/2016   History of migraine 02/10/2016   History of thyroid nodule 02/10/2016   History of sleep apnea 02/10/2016   History of renal calculi 02/10/2016   Pain in joint, shoulder region 10/02/2010   Adhesive capsulitis of shoulder 10/02/2010   Muscle weakness (generalized) 10/02/2010    Past Medical History:  Diagnosis Date   Arthritis  Asthma 2007   Depression    Diabetes mellitus    Fibromyalgia    GERD (gastroesophageal reflux disease)    Gout    Hematuria    History of kidney stones    History of tics    Interstitial cystitis 2016   Migraines    Osteoarthritis    PCOS (polycystic ovarian syndrome) 1993    Pneumonia 2019   PONV (postoperative nausea and vomiting)    Sleep apnea    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, took medication for a year   Thyroid nodule     Family History  Problem Relation Age of Onset   Fibromyalgia Mother    Psoriasis Mother        psoriatic arthritis    Diabetes Mother    Heart disease Mother    Psoriasis Sister        psoriatic arthritis    Diabetes Sister    Rheum arthritis Sister    Fibromyalgia Sister    Diabetes Sister    Past Surgical History:  Procedure Laterality Date   ABDOMINAL HYSTERECTOMY  2000   APPENDECTOMY  1976   BREAST LUMPECTOMY  1989   lt-negative   CARDIOVASCULAR STRESS TEST  2000   CATARACT EXTRACTION W/PHACO Left 12/07/2019   Procedure: CATARACT EXTRACTION PHACO AND INTRAOCULAR LENS PLACEMENT ([place]);  Surgeon: Baruch Goldmann, MD;  Location: [facility];  Service: Ophthalmology;  Laterality: Left;  CDE: 5.55   CHOLECYSTECTOMY  1985   COLONOSCOPY     HYSTERECTOMY ABDOMINAL WITH SALPINGECTOMY Bilateral 2000   KIDNEY STONE SURGERY Right 2014   with stent placement in OR   KNEE ARTHROSCOPY Right 08/22/2013   Procedure: ARTHROSCOPY RIGHT KNEE FOR INFECTION LAVAGE AND DRAINAGE;  Surgeon: Yvette Rack., MD;  Location: [facility];  Service: Orthopedics;  Laterality: Right;   KNEE ARTHROSCOPY WITH PATELLA RECONSTRUCTION Right 08/06/2013   Procedure: RIGHT KNEE ARTHROSCOPY WITH MENISCECTOMY MEDIAL, ARTHROSCOPY KNEE WITH DEBRIDEMENT/SHAVING (CHONDROPLASTY) ;  Surgeon: Yvette Rack., MD;  Location: [facility];  Service: Orthopedics;  Laterality: Right;   LUMBAR LAMINECTOMY/DECOMPRESSION MICRODISCECTOMY Right 09/11/2019   Procedure: Right Lumbar Three-Four Microdiscectomy;  Surgeon: Erline Levine, MD;  Location: [facility];  Service: Neurosurgery;  Laterality: Right;  Right Lumbar Three-Four Microdiscectomy   NASAL SINUS SURGERY     x4   OOPHORECTOMY     SHOULDER SURGERY  2011   right shoulder    THYROID SURGERY  1994   TONSILLECTOMY Bilateral 1974   Social History   Social History Narrative   Not on file   Immunization History  Administered Date(s) Administered   Tdap 03/04/2021     Objective: Vital Signs: BP (!) 145/84 (BP Location: Left Arm, Patient Position: Sitting, Cuff Size: Normal)    Pulse 87    Ht 5\' 4"  (1.626 m)    Wt 179 lb 3.2 oz (81.3 kg)    BMI 30.76 kg/m    Physical Exam Vitals and nursing note reviewed.  Constitutional:      Appearance: She is well-developed.  HENT:     Head: Normocephalic and atraumatic.  Eyes:     Conjunctiva/sclera: Conjunctivae normal.  Cardiovascular:     Rate and Rhythm: Normal rate and regular rhythm.     Heart sounds: Normal heart sounds.  Pulmonary:     Effort: Pulmonary effort is normal.     Breath sounds: Normal breath sounds.  Abdominal:     General: Bowel sounds  are normal.     Palpations: Abdomen is soft.  Musculoskeletal:     Cervical back: Normal range of motion.  Lymphadenopathy:     Cervical: No cervical adenopathy.  Skin:    General: Skin is warm and dry.     Capillary Refill: Capillary refill takes less than 2 seconds.  Neurological:     Mental Status: She is alert and oriented to person, place, and time.  Psychiatric:        Behavior: Behavior normal.     Musculoskeletal Exam: Generalized hyperalgesia and positive tender points on examination.  C-spine has limited range of motion without rotation.  Trapezius muscle tension and tenderness bilaterally.  Thoracic and lumbar spine have good range of motion with no midline spinal tenderness or SI joint tenderness.  Shoulder joints, elbow joints, wrist joints, MCPs, PIPs, DIPs have good range of motion with no synovitis.  She has PIP and DIP thickening consistent with osteoarthritis of both hands.  She was able to make a complete fist bilaterally.  Hip joints have good range of motion with no groin pain.  Tenderness palpation over the right trochanteric bursa.   Knee joints have good range of motion with no warmth or effusion.  Ankle joints have good range of motion with no tenderness or joint swelling.  Tenderness and erythema over the right great toe noted.  PIP and DIP thickening consistent with osteoarthritis of both feet.  No evidence of Achilles tendonitis or plantar fasciitis.   CDAI Exam: CDAI Score: 0.4  Patient Global: 2 mm; Provider Global: 2 mm Swollen: 0 ; Tender: 1  Joint Exam 04/07/2021      Right  Left  Cervical Spine   Tender        Investigation: No additional findings.  Imaging: XR Cervical Spine 2 or 3 views  Result Date: 04/07/2021 Multilevel spondylosis with anterior spurring was noted.  Facet joint arthropathy was noted.  C1-C2 narrowing was noted. Impression: These findings are consistent with multilevel spondylosis and facet joint arthropathy.   Recent Labs: Lab Results  Component Value Date   WBC 15.5 (H) 03/05/2021   HGB 10.7 (L) 03/05/2021   PLT 273 03/05/2021   NA 137 03/05/2021   K 4.1 03/05/2021   CL 106 03/05/2021   CO2 22 03/05/2021   GLUCOSE 141 (H) 03/05/2021   BUN 9 03/05/2021   CREATININE 0.69 03/05/2021   BILITOT 0.5 03/05/2021   ALKPHOS 54 03/05/2021   AST 15 03/05/2021   ALT 26 03/05/2021   PROT 6.1 (L) 03/05/2021   ALBUMIN 3.4 (L) 03/05/2021   CALCIUM 8.9 03/05/2021   GFRAA 98 03/24/2020    Speciality Comments: PLQ Eye Exam: 07/31/2019 @ [facility]   PLQ eye exam scheduled for 02/12/2021.  Procedures:  No procedures performed Allergies: Sulfa antibiotics, Codeine, Erythromycin, Metronidazole, and Morphine and related   Assessment / Plan:     Visit Diagnoses: Rheumatoid arthritis of multiple sites with negative rheumatoid factor (HCC): She has no synovitis on examination today.  She is not exhibiting any signs or symptoms of a rheumatoid arthritis flare. She has been experiencing increased myalgias and muscle tenderness due to fibromyalgia.  She is clinically doing well  taking Plaquenil 200 mg 1 tablet by mouth twice daily Monday through Friday.  Her rheumatoid arthritis is well controlled on the current treatment regimen.  No medication changes will be made at this time.  She was advised to notify us if she develop signs or symptoms of  a flare.  She will follow up in 5 months.   High risk medication use - Plaquenil 200 mg 1 tablet by mouth twice daily Monday through Friday.  PLQ Eye Exam: 07/31/2019 @ [facility]  PLQ eye exam was scheduled for 02/12/2021. We will call to obtain the most recent eye exam records.  CBC and CMP updated on 03/05/21.    Primary osteoarthritis of both hands: She has PIP and DIP thickening consistent with osteoarthritis of both hands.  Complete fist formation bilaterally.  Discussed the importance of joint protection and muscle strengthening.  Primary osteoarthritis of right knee: She has good range of motion of the right knee joint on examination today.  No warmth or effusion was noted.  Primary osteoarthritis of both feet: She continues to experience intermittent pain and stiffness in both feet due to ongoing osteoarthritis.  Discussed the importance of wearing proper fitting shoes.  She has tenderness and erythema of the right great toe.  She was diagnosed with cellulitis on 03/02/2021 at which time she was admitted for 4 days of IV antibiotics.  She is currently on oral doxycycline.  She will be following up closely with her PCP.  Fibromyalgia -She has generalized hyperalgesia and positive tender points on examination.  She remains on cymbalta, zanaflex, baclofen, and gabapentin as prescribed. She has been experiencing increased trapezius muscle tension and tenderness bilaterally.  She has not been experiencing any muscle spasms recently.  X-rays of the C-spine were updated today.  Discussed the importance of performing stretching exercises.  She was referred to integrative therapies to try to alleviate her symptoms.  She was  advised to notify us if her symptoms persist or worsen.  Discussed the importance of regular exercise and good sleep hygiene.  Plan: Ambulatory referral to Physical Therapy  Neck pain - She presents today with increased neck pain and stiffness which started several months ago.  She has not had any recent injury.  On examination she has good range of motion with some discomfort with lateral rotation.  She has trapezius muscle tension and tenderness bilaterally.  X-rays of the C-spine were obtained today.  A referral to integrative therapies was placed.  She was advised to notify us if her symptoms persist or worsen.  She plans on continuing to take meloxicam 15 mg 1 tablet daily as well as baclofen and tizanidine as needed for symptomatic relief. plan: XR Cervical Spine 2 or 3 views, Ambulatory referral to Physical Therapy  Trochanteric bursitis of right hip: She has tenderness to palpation over the right trochanteric bursa.  Discussed the importance of performing stretching exercises daily.  She was given a handout of exercises to perform.   Other fatigue: She continues to experience chronic fatigue due to fibromyalgia.  Discussed the importance of regular exercise.   Chronic SI joint pain: She continues to experience discomfort in both SI joints.  She has some tenderness to palpation over both SI joints on examination today.  DDD (degenerative disc disease), lumbar: She continues to experience discomfort in her lower back.  Her back specialist prescribed meloxicam 15 mg 1 tablet daily which has been managing her discomfort.  History of cellulitis: Right foot.  Hx of diabetes.  Admitted to [facility] on 03/02/21 for 4 days to receive IV antibiotics.  Blood cultures were negative at that time.  She was discharged on oral doxycycline and Augmentin for 7 days.  She is currently on another round of doxycycline prescribed by her PCP due  to ongoing redness and swelling of the right great toe.  She continues to  have intermittent low-grade fevers. No open wounds were noted.  Erythema of the great toe was noted but no signs of an abscess or drainage were noted.  She will be following up with her PCP for further evaluation and management.  Other medical conditions are listed as follows:  History of tics  History of diabetes mellitus  History of thyroid nodule  History of sleep apnea  History of renal calculi  History of gastroesophageal reflux (GERD)  History of positive PPD  History of migraine  History of PCOS  History of depression    Orders: Orders Placed This Encounter  Procedures   XR Cervical Spine 2 or 3 views   Ambulatory referral to Physical Therapy   No orders of the defined types were placed in this encounter.   Follow-Up Instructions: Return in about 5 months (around 09/04/2021) for Rheumatoid arthritis, Osteoarthritis, Fibromyalgia.   Ofilia Neas, PA-C  Note - This record has been created using Dragon software.  Chart creation errors have been sought, but may not always  have been located. Such creation errors do not reflect on  the standard of medical care.

## 2021-04-03 ENCOUNTER — Other Ambulatory Visit: Payer: Self-pay | Admitting: Rheumatology

## 2021-04-03 NOTE — Telephone Encounter (Signed)
Next Visit: 03/24/2021   Last Visit: 10/21/2020   Last Fill: 03/03/2021   Dx: Fibromyalgia   Current Dose per office note on 10/21/2020: baclofen 10 mg twice daily as needed during the day and tizanidine 4 mg at bedtime as needed for muscle spasms   Okay to refill  Baclofen and Tizanidine?

## 2021-04-07 ENCOUNTER — Encounter: Payer: Self-pay | Admitting: Physician Assistant

## 2021-04-07 ENCOUNTER — Ambulatory Visit: Payer: Self-pay

## 2021-04-07 ENCOUNTER — Ambulatory Visit: Payer: Medicare PPO | Admitting: Physician Assistant

## 2021-04-07 ENCOUNTER — Other Ambulatory Visit: Payer: Self-pay

## 2021-04-07 VITALS — BP 145/84 | HR 87 | Ht 64.0 in | Wt 179.2 lb

## 2021-04-07 DIAGNOSIS — Z79899 Other long term (current) drug therapy: Secondary | ICD-10-CM | POA: Diagnosis not present

## 2021-04-07 DIAGNOSIS — M19041 Primary osteoarthritis, right hand: Secondary | ICD-10-CM

## 2021-04-07 DIAGNOSIS — Z9289 Personal history of other medical treatment: Secondary | ICD-10-CM

## 2021-04-07 DIAGNOSIS — G8929 Other chronic pain: Secondary | ICD-10-CM

## 2021-04-07 DIAGNOSIS — M0609 Rheumatoid arthritis without rheumatoid factor, multiple sites: Secondary | ICD-10-CM | POA: Diagnosis not present

## 2021-04-07 DIAGNOSIS — Z87442 Personal history of urinary calculi: Secondary | ICD-10-CM

## 2021-04-07 DIAGNOSIS — M19042 Primary osteoarthritis, left hand: Secondary | ICD-10-CM

## 2021-04-07 DIAGNOSIS — M542 Cervicalgia: Secondary | ICD-10-CM | POA: Diagnosis not present

## 2021-04-07 DIAGNOSIS — Z8659 Personal history of other mental and behavioral disorders: Secondary | ICD-10-CM

## 2021-04-07 DIAGNOSIS — Z8669 Personal history of other diseases of the nervous system and sense organs: Secondary | ICD-10-CM

## 2021-04-07 DIAGNOSIS — Z8719 Personal history of other diseases of the digestive system: Secondary | ICD-10-CM

## 2021-04-07 DIAGNOSIS — M1711 Unilateral primary osteoarthritis, right knee: Secondary | ICD-10-CM

## 2021-04-07 DIAGNOSIS — M797 Fibromyalgia: Secondary | ICD-10-CM

## 2021-04-07 DIAGNOSIS — Z8742 Personal history of other diseases of the female genital tract: Secondary | ICD-10-CM

## 2021-04-07 DIAGNOSIS — R5383 Other fatigue: Secondary | ICD-10-CM

## 2021-04-07 DIAGNOSIS — M533 Sacrococcygeal disorders, not elsewhere classified: Secondary | ICD-10-CM

## 2021-04-07 DIAGNOSIS — Z8639 Personal history of other endocrine, nutritional and metabolic disease: Secondary | ICD-10-CM

## 2021-04-07 DIAGNOSIS — M19071 Primary osteoarthritis, right ankle and foot: Secondary | ICD-10-CM

## 2021-04-07 DIAGNOSIS — M5136 Other intervertebral disc degeneration, lumbar region: Secondary | ICD-10-CM

## 2021-04-07 DIAGNOSIS — M19072 Primary osteoarthritis, left ankle and foot: Secondary | ICD-10-CM

## 2021-04-07 DIAGNOSIS — M7061 Trochanteric bursitis, right hip: Secondary | ICD-10-CM

## 2021-04-07 DIAGNOSIS — Z872 Personal history of diseases of the skin and subcutaneous tissue: Secondary | ICD-10-CM

## 2021-04-07 MED ORDER — HYDROXYCHLOROQUINE SULFATE 200 MG PO TABS: ORAL_TABLET | ORAL | 0 refills | Status: DC

## 2021-04-07 NOTE — Progress Notes (Signed)
X-rays of the C-spine are consistent with multilevel spondylosis and facet joint arthropathy.  Please notify the patient.

## 2021-04-07 NOTE — Patient Instructions (Signed)
Neck Exercises Ask your health care provider which exercises are safe for you. Do exercises exactly as told by your health care provider and adjust them as directed. It is normal to feel mild stretching, pulling, tightness, or discomfort as you do these exercises. Stop right away if you feel sudden pain or your pain gets worse. Do not begin these exercises until told by your health care provider. Neck exercises can be important for many reasons. They can improve strength and maintain flexibility in your neck, which will help your upper back and prevent neck pain. Stretching exercises Rotation neck stretching  Sit in a chair or stand up. Place your feet flat on the floor, shoulder-width apart. Slowly turn your head (rotate) to the right until a slight stretch is felt. Turn it all the way to the right so you can look over your right shoulder. Do not tilt or tip your head. Hold this position for 10-30 seconds. Slowly turn your head (rotate) to the left until a slight stretch is felt. Turn it all the way to the left so you can look over your left shoulder. Do not tilt or tip your head. Hold this position for 10-30 seconds. Repeat __________ times. Complete this exercise __________ times a day. Neck retraction  Sit in a sturdy chair or stand up. Look straight ahead. Do not bend your neck. Use your fingers to push your chin backward (retraction). Do not bend your neck for this movement. Continue to face straight ahead. If you are doing the exercise properly, you will feel a slight sensation in your throat and a stretch at the back of your neck. Hold the stretch for 1-2 seconds. Repeat __________ times. Complete this exercise __________ times a day. Strengthening exercises Neck press  Lie on your back on a firm bed or on the floor with a pillow under your head. Use your neck muscles to push your head down on the pillow and straighten your spine. Hold the position as well as you can. Keep your head  facing up (in a neutral position) and your chin tucked. Slowly count to 5 while holding this position. Repeat __________ times. Complete this exercise __________ times a day. Isometrics These are exercises in which you strengthen the muscles in your neck while keeping your neck still (isometrics). Sit in a supportive chair and place your hand on your forehead. Keep your head and face facing straight ahead. Do not flex or extend your neck while doing isometrics. Push forward with your head and neck while pushing back with your hand. Hold for 10 seconds. Do the sequence again, this time putting your hand against the back of your head. Use your head and neck to push backward against the hand pressure. Finally, do the same exercise on either side of your head, pushing sideways against the pressure of your hand. Repeat __________ times. Complete this exercise __________ times a day. Prone head lifts  Lie face-down (prone position), resting on your elbows so that your chest and upper back are raised. Start with your head facing downward, near your chest. Position your chin either on or near your chest. Slowly lift your head upward. Lift until you are looking straight ahead. Then continue lifting your head as far back as you can comfortably stretch. Hold your head up for 5 seconds. Then slowly lower it to your starting position. Repeat __________ times. Complete this exercise __________ times a day. Supine head lifts  Lie on your back (supine position), bending your knees  to point to the ceiling and keeping your feet flat on the floor. Lift your head slowly off the floor, raising your chin toward your chest. Hold for 5 seconds. Repeat __________ times. Complete this exercise __________ times a day. Scapular retraction  Stand with your arms at your sides. Look straight ahead. Slowly pull both shoulders (scapulae) backward and downward (retraction) until you feel a stretch between your shoulder  blades in your upper back. Hold for 10-30 seconds. Relax and repeat. Repeat __________ times. Complete this exercise __________ times a day. Contact a health care provider if: Your neck pain or discomfort gets worse when you do an exercise. Your neck pain or discomfort does not improve within 2 hours after you exercise. If you have any of these problems, stop exercising right away. Do not do the exercises again unless your health care provider says that you can. Get help right away if: You develop sudden, severe neck pain. If this happens, stop exercising right away. Do not do the exercises again unless your health care provider says that you can. This information is not intended to replace advice given to you by your health care provider. Make sure you discuss any questions you have with your health care provider.  Hip Bursitis Rehab Ask your health care provider which exercises are safe for you. Do exercises exactly as told by your health care provider and adjust them as directed. It is normal to feel mild stretching, pulling, tightness, or discomfort as you do these exercises. Stop right away if you feel sudden pain or your pain gets worse. Do not begin these exercises until told by your health care provider. Stretching exercise This exercise warms up your muscles and joints and improves the movement and flexibility of your hip. This exercise also helps to relieve pain and stiffness. Iliotibial band stretch An iliotibial band is a strong band of muscle tissue that runs from the outer side of your hip to the outer side of your thigh and knee. Lie on your side with your left / right leg in the top position. Bend your left / right knee and grab your ankle. Stretch out your bottom arm to help you balance. Slowly bring your knee back so your thigh is behind your body. Slowly lower your knee toward the floor until you feel a gentle stretch on the outside of your left / right thigh. If you do not  feel a stretch and your knee will not fall farther, place the heel of your other foot on top of your knee and pull your knee down toward the floor with your foot. Hold this position for __________ seconds. Slowly return to the starting position. Repeat __________ times. Complete this exercise __________ times a day. Strengthening exercises These exercises build strength and endurance in your hip and pelvis. Endurance is the ability to use your muscles for a long time, even after they get tired. Bridge This exercise strengthens the muscles that move your thigh backward (hip extensors). Lie on your back on a firm surface with your knees bent and your feet flat on the floor. Tighten your buttocks muscles and lift your buttocks off the floor until your trunk is level with your thighs. Do not arch your back. You should feel the muscles working in your buttocks and the back of your thighs. If you do not feel these muscles, slide your feet 1-2 inches (2.5-5 cm) farther away from your buttocks. If this exercise is too easy, try doing it with  your arms crossed over your chest. Hold this position for __________ seconds. Slowly lower your hips to the starting position. Let your muscles relax completely after each repetition. Repeat __________ times. Complete this exercise __________ times a day. Squats This exercise strengthens the muscles in front of your thigh and knee (quadriceps). Stand in front of a table, with your feet and knees pointing straight ahead. You may rest your hands on the table for balance but not for support. Slowly bend your knees and lower your hips like you are going to sit in a chair. Keep your weight over your heels, not over your toes. Keep your lower legs upright so they are parallel with the table legs. Do not let your hips go lower than your knees. Do not bend lower than told by your health care provider. If your hip pain increases, do not bend as low. Hold the squat  position for __________ seconds. Slowly push with your legs to return to standing. Do not use your hands to pull yourself to standing. Repeat __________ times. Complete this exercise __________ times a day. Hip hike Stand sideways on a bottom step. Stand on your left / right leg with your other foot unsupported next to the step. You can hold on to the railing or wall for balance if needed. Keep your knees straight and your torso square. Then lift your left / right hip up toward the ceiling. Hold this position for __________ seconds. Slowly let your left / right hip lower toward the floor, past the starting position. Your foot should get closer to the floor. Do not lean or bend your knees. Repeat __________ times. Complete this exercise __________ times a day. Single leg stand Without shoes, stand near a railing or in a doorway. You may hold on to the railing or door frame as needed for balance. Squeeze your left / right buttock muscles, then lift up your other foot. Do not let your left / right hip push out to the side. It is helpful to stand in front of a mirror for this exercise so you can watch your hip. Hold this position for __________ seconds. Repeat __________ times. Complete this exercise __________ times a day. This information is not intended to replace advice given to you by your health care provider. Make sure you discuss any questions you have with your health care provider. Document Revised: 06/05/2018 Document Reviewed: 06/05/2018 Elsevier Patient Education  [employer].

## 2021-04-07 NOTE — Telephone Encounter (Signed)
Baclofen and plaquenil refills pended. Please review and send to the pharmacy. Thanks!

## 2021-04-14 ENCOUNTER — Telehealth: Payer: Self-pay | Admitting: *Deleted

## 2021-04-14 NOTE — Telephone Encounter (Signed)
[facility] a [facility] for  Baclofen . Authorization has been APPROVED from 04/13/2021 to 02/21/2022.

## 2021-04-23 ENCOUNTER — Encounter (HOSPITAL_COMMUNITY): Payer: Self-pay | Admitting: Physical Therapy

## 2021-04-23 ENCOUNTER — Other Ambulatory Visit: Payer: Self-pay

## 2021-04-23 ENCOUNTER — Ambulatory Visit (HOSPITAL_COMMUNITY): Payer: Medicare PPO | Attending: Physician Assistant | Admitting: Physical Therapy

## 2021-04-23 DIAGNOSIS — R29898 Other symptoms and signs involving the musculoskeletal system: Secondary | ICD-10-CM

## 2021-04-23 DIAGNOSIS — M542 Cervicalgia: Secondary | ICD-10-CM | POA: Diagnosis not present

## 2021-04-23 DIAGNOSIS — M797 Fibromyalgia: Secondary | ICD-10-CM | POA: Insufficient documentation

## 2021-04-23 DIAGNOSIS — R293 Abnormal posture: Secondary | ICD-10-CM

## 2021-04-23 NOTE — Therapy (Signed)
[place], [place], [address] Phone: [phone]   Fax:  [phone]  Physical Therapy Evaluation  Patient Details  Name: Christina Lewis MRN: [id number] Date of Birth: 02-05-1956 Referring Provider (PT): Hazel Sams PA-C   Encounter Date: 04/23/2021   PT End of Session - 04/23/21 1043     Visit Number 1    Number of Visits 8    Date for PT Re-Evaluation 05/21/21    Authorization Type [employer]    Authorization Time Period 8 visits requested - check auth    PT Start Time 3295    PT Stop Time 1040    PT Time Calculation (min) 38 min    Activity Tolerance Patient tolerated treatment well    Behavior During Therapy [facility] for tasks assessed/performed             Past Medical History:  Diagnosis Date   Arthritis    Asthma 2007   Depression    Diabetes mellitus    Fibromyalgia    GERD (gastroesophageal reflux disease)    Gout    Hematuria    History of kidney stones    History of tics    Interstitial cystitis 2016   Migraines    Osteoarthritis    PCOS (polycystic ovarian syndrome) 1993   Pneumonia 2019   PONV (postoperative nausea and vomiting)    Sleep apnea    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, took medication for a year   Thyroid nodule     Past Surgical History:  Procedure Laterality Date   ABDOMINAL HYSTERECTOMY  2000   APPENDECTOMY  1976   BREAST LUMPECTOMY  1989   lt-negative   CARDIOVASCULAR STRESS TEST  2000   CATARACT EXTRACTION W/PHACO Left 12/07/2019   Procedure: CATARACT EXTRACTION PHACO AND INTRAOCULAR LENS PLACEMENT ([place]);  Surgeon: Baruch Goldmann, MD;  Location: [facility];  Service: Ophthalmology;  Laterality: Left;  CDE: 5.55   CHOLECYSTECTOMY  1985   COLONOSCOPY     HYSTERECTOMY ABDOMINAL WITH SALPINGECTOMY Bilateral 2000   KIDNEY STONE SURGERY Right 2014   with stent placement in OR   KNEE ARTHROSCOPY Right 08/22/2013   Procedure: ARTHROSCOPY RIGHT  KNEE FOR INFECTION LAVAGE AND DRAINAGE;  Surgeon: Yvette Rack., MD;  Location: [facility];  Service: Orthopedics;  Laterality: Right;   KNEE ARTHROSCOPY WITH PATELLA RECONSTRUCTION Right 08/06/2013   Procedure: RIGHT KNEE ARTHROSCOPY WITH MENISCECTOMY MEDIAL, ARTHROSCOPY KNEE WITH DEBRIDEMENT/SHAVING (CHONDROPLASTY) ;  Surgeon: Yvette Rack., MD;  Location: [place];  Service: Orthopedics;  Laterality: Right;   LUMBAR LAMINECTOMY/DECOMPRESSION MICRODISCECTOMY Right 09/11/2019   Procedure: Right Lumbar Three-Four Microdiscectomy;  Surgeon: Erline Levine, MD;  Location: [place];  Service: Neurosurgery;  Laterality: Right;  Right Lumbar Three-Four Microdiscectomy   NASAL SINUS SURGERY     x4   OOPHORECTOMY     SHOULDER SURGERY  2011   right shoulder   THYROID SURGERY  1994   TONSILLECTOMY Bilateral 1974    There were no vitals filed for this visit.    Subjective Assessment - 04/23/21 1004     Subjective Patient is a 66 y.o. female who presents to physical therapy with referral for fibromyalgia and chronic neck pain. Has migraines but has also had neck aching.  She states increased symptoms with lifting, sleeping with flexed neck, overhead reaching, sitting for prolonged periods >1 hour. Symptoms ease with ice. Her main goal  is to stop head and neck from hurting.  She stopped coming to PT last time with migraine lasting about 2 months. PT was helpful for shorter time periods. States L radicular symptoms intermittently.    Pertinent History Fibromyalgia, chronic neck pain, RA    Limitations Lifting;House hold activities    How long can you stand comfortably? 1 hour    Patient Stated Goals decrease pain    Currently in Pain? Yes    Pain Score 2     Pain Location Neck    Pain Descriptors / Indicators Aching    Pain Type Chronic pain    Pain Onset More than a month ago    Pain Frequency Constant                [facility] PT Assessment - 04/23/21 0001        Assessment   Medical Diagnosis Fibromyalga and Neck pain    Referring Provider (PT) Hazel Sams PA-C    Prior Therapy yes      Precautions   Precautions None      Restrictions   Weight Bearing Restrictions No      Balance Screen   Has the patient fallen in the past 6 months No    Has the patient had a decrease in activity level because of a fear of falling?  No    Is the patient reluctant to leave their home because of a fear of falling?  No      Prior Function   Level of Independence Independent      Cognition   Overall Cognitive Status Within Functional Limits for tasks assessed      Observation/Other Assessments   Observations ambulates without AD, frequently slouched and changing positions, mostly L lateral lean    Focus on Therapeutic Outcomes (FOTO)  52% function      Sensation   Light Touch Appears Intact      Posture/Postural Control   Posture/Postural Control Postural limitations    Postural Limitations Rounded Shoulders;Forward head;Increased thoracic kyphosis;Weight shift right;Weight shift left      ROM / Strength   AROM / PROM / Strength AROM;Strength      AROM   AROM Assessment Site Cervical    Cervical Flexion 0% limited    Cervical Extension 25% limited, pull/pain    Cervical - Right Side Bend 25% limited, pull/pain    Cervical - Left Side Bend 0% limited    Cervical - Right Rotation 25% limited, mild pull/pain    Cervical - Left Rotation 0% limited      Strength   Strength Assessment Site Shoulder;Elbow;Wrist;Hand    Right/Left Shoulder Right;Left    Right Shoulder Flexion 5/5    Right Shoulder ABduction 5/5    Left Shoulder Flexion 4+/5    Left Shoulder ABduction 4+/5    Right/Left Elbow Right;Left    Right Elbow Flexion 5/5    Right Elbow Extension 5/5    Left Elbow Flexion 5/5    Left Elbow Extension 5/5    Right/Left Wrist Right;Left    Right Wrist Flexion 5/5    Right Wrist Extension 5/5    Left Wrist Flexion 5/5    Left Wrist  Extension 5/5    Right/Left hand Right;Left    Right Hand Gross Grasp Functional    Left Hand Gross Grasp Functional      Palpation   Palpation comment TTP bilateral L>R UT, periscap, paraspinals, deltoid  Objective measurements completed on examination: See above findings.       [facility] Adult PT Treatment/Exercise - 04/23/21 0001       Exercises   Exercises Neck      Neck Exercises: Seated   Other Seated Exercise Proper posture education, UT stretch 3x 20 second holds bilateral; scap ret 2x 10                     PT Education - 04/23/21 1003     Education Details Patient educated on exam findings, POC, scope of PT, HEP, Posture, increasing overall activity    Person(s) Educated Patient    Methods Explanation;Demonstration;Handout    Comprehension Verbalized understanding;Returned demonstration              PT Short Term Goals - 04/23/21 1049       PT SHORT TERM GOAL #1   Title Patient will be independent with HEP in order to optimze functional outcomes.    Time 2    Period Weeks    Status New    Target Date 05/07/21               PT Long Term Goals - 04/23/21 1049       PT LONG TERM GOAL #1   Title Patient will report at least 50% improvement in symptoms for improved quality of life.    Time 4    Period Weeks    Status New    Target Date 05/21/21      PT LONG TERM GOAL #2   Title Patient will improve FOTO score by at least 5 points in order to indicate improved tolerance to activity.    Time 4    Period Weeks    Status New    Target Date 05/21/21      PT LONG TERM GOAL #3   Title Patient will report ability to manage neck pain/headaches with HEP in order to transition to self care.    Time 4    Period Weeks    Status New    Target Date 05/21/21                    Plan - 04/23/21 1046     Clinical Impression Statement Patient is a 66 y.o. female who presents to physical therapy  with referral for fibromyalgia and neck pain. She presents with pain limited deficits in cervical spine strength, ROM, endurance, postural impairments, spinal mobility and functional mobility with ADL. She is having to modify and restrict ADL as indicated by FOTO score as well as subjective information and objective measures which is affecting overall participation. Patient will benefit from skilled physical therapy in order to improve function and reduce impairment.    Personal Factors and Comorbidities Fitness;Comorbidity 3+;Time since onset of injury/illness/exacerbation    Comorbidities RA, Fibromyalgia, chronic neck pain, migraines    Examination-Activity Limitations Bend;Sit;Sleep;Lift    Examination-Participation Restrictions Meal Prep;Cleaning;Community Activity;Volunteer;Yard Work;Shop;Laundry    Stability/Clinical Decision Making Stable/Uncomplicated    Clinical Decision Making Low    Rehab Potential Good    PT Frequency 2x / week    PT Duration 4 weeks    PT Treatment/Interventions ADLs/Self Care Home Management;Moist Heat;Traction;Ultrasound;DME Instruction;Gait training;Stair training;Functional mobility training;Therapeutic activities;Therapeutic exercise;Balance training;Neuromuscular re-education;Patient/family education;Orthotic Fit/Training;Manual techniques;Manual lymph drainage;Compression bandaging;Scar mobilization;Passive range of motion;Dry needling;Energy conservation;Splinting;Taping    PT Next Visit Plan f/u with HEP, postural strengthening, cervical retractions, improve c/sp and t/sp mobility, manual if needed  PT Home Exercise Plan posture, scap ret, UT stretch    Consulted and Agree with Plan of Care Patient             Patient will benefit from skilled therapeutic intervention in order to improve the following deficits and impairments:  Decreased range of motion, Decreased endurance, Impaired UE functional use, Increased muscle spasms, Decreased activity  tolerance, Pain, Impaired flexibility, Improper body mechanics, Decreased mobility, Decreased strength, Postural dysfunction  Visit Diagnosis: Neck pain  Abnormal posture  Other symptoms and signs involving the musculoskeletal system     Problem List Patient Active Problem List   Diagnosis Date Noted   Cellulitis 03/04/2021   Rheumatoid arthritis ([place]) 03/04/2021   Diabetes mellitus type 2 in obese ([place]) 03/04/2021   Right foot infection 03/04/2021   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 myelopathy 09/11/2019   Leukocytosis 11/04/2016   Fibromyalgia 02/10/2016   Other fatigue 02/10/2016   Primary osteoarthritis of both hands 02/10/2016   History of migraine 02/10/2016   History of thyroid nodule 02/10/2016   History of sleep apnea 02/10/2016   History of renal calculi 02/10/2016   Pain in joint, shoulder region 10/02/2010   Adhesive capsulitis of shoulder 10/02/2010   Muscle weakness (generalized) 10/02/2010    10:51 AM, 04/23/21 Mearl Latin PT, DPT Physical Therapist at [place], [place], [address] Phone: [phone]   Fax:  [phone]  Name: Christina Lewis MRN: [id number] Date of Birth: 05-06-1955

## 2021-04-23 NOTE — Patient Instructions (Signed)
Access Code: [email or web address] ?URL: [email or web address] ?Date: 04/23/2021 ?Prepared by: Mitzi Hansen Safwan Tomei ? ?Exercises ?Seated Upper Trapezius Stretch (Mirrored) - 2 x daily - 7 x weekly - 3 reps - 20 second hold ?Seated Scapular Retraction - 2 x daily - 7 x weekly - 2 sets - 10 reps ?Correct Seated Posture ? ?

## 2021-04-24 ENCOUNTER — Other Ambulatory Visit: Payer: Self-pay | Admitting: Rheumatology

## 2021-04-24 NOTE — Telephone Encounter (Signed)
Next Visit: 09/03/2021 ? ?Last Visit: 04/07/2021 ? ?Last Fill: 04/03/2021 ? ?Dx: Neck pain, Fibromyalgia  ? ?Current Dose per office note on 04/07/2021: not discussed ? ?Okay to refill Tizanidine?   ?

## 2021-04-27 ENCOUNTER — Ambulatory Visit (HOSPITAL_COMMUNITY): Payer: Medicare PPO | Admitting: Physical Therapy

## 2021-04-27 NOTE — Progress Notes (Signed)
Office Visit Note  Patient: Christina Lewis             Date of Birth: [birth year]           MRN: [id number]             PCP: Kirstie Peri, MD Referring: Kirstie Peri, MD Visit Date: 04/29/2021 Occupation: @GUAROCC @  Subjective:  Pain of the Neck (Swelling, pain and numbness radiating down left arm, head ache)   History of Present Illness: Christina Lewis is a 66 y.o. female with history of seronegative rheumatoid arthritis, osteoarthritis and fibromyalgia.  She states that she was seen in the office on February 14 for neck pain.  At the time the x-rays were performed which were consistent with disc disease and facet joint arthropathy.  She was referred to physical therapy.  She has been going to physical therapy on a regular basis.  She has been taking meloxicam which has not been helpful.  The neck pain has gotten worse in the last 2 weeks.  She states the pain is constant now and has been radiating to her left arm.  She is experiencing numbness in her left hand.  She has known history of disc disease of lumbar spine.  She sees Dr. Venetia Maxon for her disc disease of the lumbar spine.  She is a scheduled to have cortisone injection in her lumbar spine.  She states her rheumatoid arthritis is well controlled on hydroxychloroquine.  She denies any side effects from hydroxychloroquine.  She recently had cellulitis in her right foot which has resolved.  Activities of Daily Living:  Patient reports morning stiffness for 24 hours.   Patient Reports nocturnal pain.  Difficulty dressing/grooming: Denies Difficulty climbing stairs: Reports Difficulty getting out of chair: Reports Difficulty using hands for taps, buttons, cutlery, and/or writing: Reports  Review of Systems  Constitutional:  Positive for fatigue.  HENT:  Negative for mouth dryness.   Eyes:  Positive for dryness.  Respiratory:  Negative for shortness of breath.   Cardiovascular:  Positive for swelling in legs/feet.  Gastrointestinal:   Negative for constipation.  Endocrine: Positive for cold intolerance, heat intolerance and increased urination.  Genitourinary:  Negative for difficulty urinating.  Musculoskeletal:  Positive for joint pain, gait problem, joint pain, joint swelling, muscle weakness, morning stiffness and muscle tenderness.  Skin:  Negative for rash.  Allergic/Immunologic: Negative for susceptible to infections.  Neurological:  Positive for numbness and weakness.  Hematological:  Negative for bruising/bleeding tendency.  Psychiatric/Behavioral:  Positive for sleep disturbance.    PMFS History:  Patient Active Problem List   Diagnosis Date Noted   Cellulitis 03/04/2021   Rheumatoid arthritis (HCC) 03/04/2021   Diabetes mellitus type 2 in obese (HCC) 03/04/2021   Right foot infection 03/04/2021   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 myelopathy 09/11/2019   Leukocytosis 11/04/2016   Fibromyalgia 02/10/2016   Other fatigue 02/10/2016   Primary osteoarthritis of both hands 02/10/2016   History of migraine 02/10/2016   History of thyroid nodule 02/10/2016   History of sleep apnea 02/10/2016   History of renal calculi 02/10/2016   Pain in joint, shoulder region 10/02/2010   Adhesive capsulitis of shoulder 10/02/2010   Muscle weakness (generalized) 10/02/2010    Past Medical History:  Diagnosis Date   Arthritis    Asthma 2007   Depression    Diabetes mellitus    Fibromyalgia    GERD (gastroesophageal reflux disease)    Gout    Hematuria  History of kidney stones    History of tics    Interstitial cystitis 2016   Migraines    Osteoarthritis    PCOS (polycystic ovarian syndrome) 1993   Pneumonia 2019   PONV (postoperative nausea and vomiting)    Sleep apnea    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, took medication for a year   Thyroid nodule     Family History  Problem Relation Age of Onset   Fibromyalgia Mother    Psoriasis Mother        psoriatic arthritis    Diabetes  Mother    Heart disease Mother    Psoriasis Sister        psoriatic arthritis    Diabetes Sister    Rheum arthritis Sister    Fibromyalgia Sister    Diabetes Sister    Past Surgical History:  Procedure Laterality Date   ABDOMINAL HYSTERECTOMY  2000   APPENDECTOMY  1976   BREAST LUMPECTOMY  1989   lt-negative   CARDIOVASCULAR STRESS TEST  2000   CATARACT EXTRACTION W/PHACO Left 12/07/2019   Procedure: CATARACT EXTRACTION PHACO AND INTRAOCULAR LENS PLACEMENT ([facility]);  Surgeon: [name], MD;  Location: [facility];  Service: Ophthalmology;  Laterality: Left;  CDE: 5.55   CHOLECYSTECTOMY  1985   COLONOSCOPY     HYSTERECTOMY ABDOMINAL WITH SALPINGECTOMY Bilateral 2000   KIDNEY STONE SURGERY Right 2014   with stent placement in OR   KNEE ARTHROSCOPY Right 08/22/2013   Procedure: ARTHROSCOPY RIGHT KNEE FOR INFECTION LAVAGE AND DRAINAGE;  Surgeon: Thera Flake., MD;  Location: [facility];  Service: Orthopedics;  Laterality: Right;   KNEE ARTHROSCOPY WITH PATELLA RECONSTRUCTION Right 08/06/2013   Procedure: RIGHT KNEE ARTHROSCOPY WITH MENISCECTOMY MEDIAL, ARTHROSCOPY KNEE WITH DEBRIDEMENT/SHAVING (CHONDROPLASTY) ;  Surgeon: Thera Flake., MD;  Location: [facility];  Service: Orthopedics;  Laterality: Right;   LUMBAR LAMINECTOMY/DECOMPRESSION MICRODISCECTOMY Right 09/11/2019   Procedure: Right Lumbar Three-Four Microdiscectomy;  Surgeon: Maeola Harman, MD;  Location: [facility];  Service: Neurosurgery;  Laterality: Right;  Right Lumbar Three-Four Microdiscectomy   NASAL SINUS SURGERY     x4   OOPHORECTOMY     SHOULDER SURGERY  2011   right shoulder   THYROID SURGERY  1994   TONSILLECTOMY Bilateral 1974   Social History   Social History Narrative   Not on file   Immunization History  Administered Date(s) Administered   Tdap 03/04/2021     Objective: Vital Signs: BP 134/80 (BP Location: Left Arm, Patient Position: Sitting, Cuff Size: Normal)   Pulse 98    Resp 16   Ht 5\' 4"  (1.626 m)   Wt 178 lb (80.7 kg)   BMI 30.55 kg/m    Physical Exam Vitals and nursing note reviewed.  Constitutional:      Appearance: She is well-developed.  HENT:     Head: Normocephalic and atraumatic.  Eyes:     Conjunctiva/sclera: Conjunctivae normal.  Cardiovascular:     Rate and Rhythm: Normal rate and regular rhythm.     Heart sounds: Normal heart sounds.  Pulmonary:     Effort: Pulmonary effort is normal.     Breath sounds: Normal breath sounds.  Abdominal:     General: Bowel sounds are normal.     Palpations: Abdomen is soft.  Musculoskeletal:     Cervical back: Normal range of motion.  Lymphadenopathy:     Cervical: No cervical adenopathy.  Skin:  General: Skin is warm and dry.     Capillary Refill: Capillary refill takes less than 2 seconds.  Neurological:     Mental Status: She is alert and oriented to person, place, and time.  Psychiatric:        Behavior: Behavior normal.     Musculoskeletal Exam: She had painful range of motion of her cervical spine.  She had increased pain with lateral rotation and flexion of her cervical spine.  The pain radiated to her left shoulder with forward flexion and lateral rotation.  Shoulder joints, elbow joints, wrist joints with good range of motion.  She had bilateral PIP and DIP thickening.  No synovitis was noted.  Hip joints and knee joints with good range of motion.  There was no tenderness over ankles or MTPs.  CDAI Exam: CDAI Score: 0.2  Patient Global: 1 mm; Provider Global: 1 mm Swollen: 0 ; Tender: 0  Joint Exam 04/29/2021   No joint exam has been documented for this visit   There is currently no information documented on the homunculus. Go to the Rheumatology activity and complete the homunculus joint exam.  Investigation: No additional findings.  Imaging: XR Cervical Spine 2 or 3 views  Result Date: 04/07/2021 Multilevel spondylosis with anterior spurring was noted.  Facet joint  arthropathy was noted.  C1-C2 narrowing was noted. Impression: These findings are consistent with multilevel spondylosis and facet joint arthropathy.   Recent Labs: Lab Results  Component Value Date   WBC 15.5 (H) 03/05/2021   HGB 10.7 (L) 03/05/2021   PLT 273 03/05/2021   NA 137 03/05/2021   K 4.1 03/05/2021   CL 106 03/05/2021   CO2 22 03/05/2021   GLUCOSE 141 (H) 03/05/2021   BUN 9 03/05/2021   CREATININE 0.69 03/05/2021   BILITOT 0.5 03/05/2021   ALKPHOS 54 03/05/2021   AST 15 03/05/2021   ALT 26 03/05/2021   PROT 6.1 (L) 03/05/2021   ALBUMIN 3.4 (L) 03/05/2021   CALCIUM 8.9 03/05/2021   GFRAA 98 03/24/2020    Speciality Comments: PLQ Eye Exam: 07/31/2019 @ [facility]   PLQ eye exam scheduled for 02/12/2021 - records have not been finalized by doctor shc 04/29/2021  Procedures:  No procedures performed Allergies: Sulfa antibiotics, Codeine, Erythromycin, Metronidazole, and Morphine and related   Assessment / Plan:     Visit Diagnoses: Rheumatoid arthritis of multiple sites with negative rheumatoid factor (HCC)-her rheumatoid arthritis appears to be well controlled on hydroxychloroquine.  She had no synovitis on examination.  She has been tolerating hydroxychloroquine without any side effects.  High risk medication use - Plaquenil 200 mg 1 tablet by mouth twice daily Monday through Friday.  PLQ Eye Exam: December 2022.  We will get eye exam results.  Labs obtained on January 2023 were normal except elevated WBC count due to cellulitis which were reviewed.  Primary osteoarthritis of both hands-she has bilateral PIP and DIP thickening.  Joint protection was discussed.  Primary osteoarthritis of right knee-no warmth swelling or effusion was noted.  She has chronic discomfort.  Primary osteoarthritis of both feet-proper fitting shoes were discussed.  Trochanteric bursitis of right hip-she has off-and-on discomfort.  Stretching exercises were discussed.  DDD  (degenerative disc disease), cervical -she has been experiencing severe pain and discomfort in her cervical spine.  X-ray findings from February 2023 were reviewed.  The pain is radiating to her left arm.  Referred to [facility] at the last visit.  She has been  going to physical therapy without much relief.  She is also been taking meloxicam.  She is experiencing some numbness in her left hand.  We will schedule MRI of her cervical spine to evaluate this further.  DDD (degenerative disc disease), lumbar - meloxicam 15 mg 1 tablet daily.  She continues to have lower back pain.  She has been followed by Dr. Venetia Maxon.  She is scheduled to have cortisone injection.  Chronic SI joint pain-chronic discomfort.  Fibromyalgia - She is on cymbalta, zanaflex, baclofen, and gabapentin as prescribed.   Other fatigue  History of tics  History of sleep apnea  History of diabetes mellitus  History of thyroid nodule  History of gastroesophageal reflux (GERD)  History of PCOS  History of migraine  History of renal calculi  History of positive PPD  History of depression  History of cellulitis - Right foot.  Hx of diabetes.  Admitted to [facility] on 03/02/21 for 4 days to receive IV antibiotics.  She was treated with oral doxycycline after the IV antibiotics.  Mild erythema was noted on the right foot.  Orders: Orders Placed This Encounter  Procedures   MR CERVICAL SPINE WO CONTRAST   No orders of the defined types were placed in this encounter.    Follow-Up Instructions: Return for Rheumatoid arthritis, Osteoarthritis.   Pollyann Savoy, MD  Note - This record has been created using Animal nutritionist.  Chart creation errors have been sought, but may not always  have been located. Such creation errors do not reflect on  the standard of medical care.

## 2021-04-29 ENCOUNTER — Encounter (HOSPITAL_COMMUNITY): Payer: Self-pay | Admitting: Physical Therapy

## 2021-04-29 ENCOUNTER — Other Ambulatory Visit: Payer: Self-pay

## 2021-04-29 ENCOUNTER — Encounter: Payer: Self-pay | Admitting: Rheumatology

## 2021-04-29 ENCOUNTER — Ambulatory Visit: Payer: Medicare PPO | Admitting: Rheumatology

## 2021-04-29 ENCOUNTER — Ambulatory Visit (HOSPITAL_COMMUNITY): Payer: Medicare PPO | Admitting: Physical Therapy

## 2021-04-29 VITALS — BP 134/80 | HR 98 | Resp 16 | Ht 64.0 in | Wt 178.0 lb

## 2021-04-29 DIAGNOSIS — Z9289 Personal history of other medical treatment: Secondary | ICD-10-CM

## 2021-04-29 DIAGNOSIS — Z87442 Personal history of urinary calculi: Secondary | ICD-10-CM

## 2021-04-29 DIAGNOSIS — Z872 Personal history of diseases of the skin and subcutaneous tissue: Secondary | ICD-10-CM

## 2021-04-29 DIAGNOSIS — M1711 Unilateral primary osteoarthritis, right knee: Secondary | ICD-10-CM | POA: Diagnosis not present

## 2021-04-29 DIAGNOSIS — M797 Fibromyalgia: Secondary | ICD-10-CM

## 2021-04-29 DIAGNOSIS — M19041 Primary osteoarthritis, right hand: Secondary | ICD-10-CM | POA: Diagnosis not present

## 2021-04-29 DIAGNOSIS — Z8669 Personal history of other diseases of the nervous system and sense organs: Secondary | ICD-10-CM

## 2021-04-29 DIAGNOSIS — G8929 Other chronic pain: Secondary | ICD-10-CM

## 2021-04-29 DIAGNOSIS — Z79899 Other long term (current) drug therapy: Secondary | ICD-10-CM | POA: Diagnosis not present

## 2021-04-29 DIAGNOSIS — M7061 Trochanteric bursitis, right hip: Secondary | ICD-10-CM

## 2021-04-29 DIAGNOSIS — M19042 Primary osteoarthritis, left hand: Secondary | ICD-10-CM

## 2021-04-29 DIAGNOSIS — M0609 Rheumatoid arthritis without rheumatoid factor, multiple sites: Secondary | ICD-10-CM

## 2021-04-29 DIAGNOSIS — M533 Sacrococcygeal disorders, not elsewhere classified: Secondary | ICD-10-CM

## 2021-04-29 DIAGNOSIS — Z8742 Personal history of other diseases of the female genital tract: Secondary | ICD-10-CM

## 2021-04-29 DIAGNOSIS — Z8639 Personal history of other endocrine, nutritional and metabolic disease: Secondary | ICD-10-CM

## 2021-04-29 DIAGNOSIS — M542 Cervicalgia: Secondary | ICD-10-CM | POA: Diagnosis not present

## 2021-04-29 DIAGNOSIS — R293 Abnormal posture: Secondary | ICD-10-CM

## 2021-04-29 DIAGNOSIS — M5136 Other intervertebral disc degeneration, lumbar region: Secondary | ICD-10-CM

## 2021-04-29 DIAGNOSIS — Z8659 Personal history of other mental and behavioral disorders: Secondary | ICD-10-CM

## 2021-04-29 DIAGNOSIS — R5383 Other fatigue: Secondary | ICD-10-CM

## 2021-04-29 DIAGNOSIS — M19072 Primary osteoarthritis, left ankle and foot: Secondary | ICD-10-CM

## 2021-04-29 DIAGNOSIS — R29898 Other symptoms and signs involving the musculoskeletal system: Secondary | ICD-10-CM

## 2021-04-29 DIAGNOSIS — Z8719 Personal history of other diseases of the digestive system: Secondary | ICD-10-CM

## 2021-04-29 DIAGNOSIS — M5030 Other cervical disc degeneration, unspecified cervical region: Secondary | ICD-10-CM

## 2021-04-29 DIAGNOSIS — M19071 Primary osteoarthritis, right ankle and foot: Secondary | ICD-10-CM

## 2021-04-29 NOTE — Patient Instructions (Signed)
Standing Labs °We placed an order today for your standing lab work.  ° °Please have your standing labs drawn in June ° °If possible, please have your labs drawn 2 weeks prior to your appointment so that the provider can discuss your results at your appointment. ° °Please note that you may see your imaging and lab results in MyChart before we have reviewed them. °We may be awaiting multiple results to interpret others before contacting you. °Please allow our office up to 72 hours to thoroughly review all of the results before contacting the office for clarification of your results. ° °We have open lab daily: °Monday through Thursday from 1:30-4:30 PM and Friday from 1:30-4:00 PM °at the office of Dr. Della Scrivener, [facility].   °Please be advised, all patients with office appointments requiring lab work will take precedent over walk-in lab work.  °If possible, please come for your lab work on Monday and Friday afternoons, as you may experience shorter wait times. °The office is located at [address], [address], [place],  [address] °No appointment is necessary.   °Labs are drawn by [facility]. Please bring your co-pay at the time of your lab draw.  You may receive a bill from [facility] for your lab work. ° °Please note if you are on Hydroxychloroquine and and an order has been placed for a Hydroxychloroquine level, you will need to have it drawn 4 hours or more after your last dose. ° °If you wish to have your labs drawn at another location, please call the office 24 hours in advance to send orders. ° °If you have any questions regarding directions or hours of operation,  °please call [phone].   °As a reminder, please drink plenty of water prior to coming for your lab work. Thanks!  ° °Vaccines °You are taking a medication(s) that can suppress your immune system.  The following immunizations are recommended: °Flu annually °Covid-19  °Td/Tdap (tetanus, diphtheria, pertussis) every 10  years °Pneumonia (Prevnar 15 then Pneumovax 23 at least 1 year apart.  Alternatively, can take Prevnar 20 without needing additional dose) °Shingrix: 2 doses from 4 weeks to 6 months apart ° °Please check with your PCP to make sure you are up to date.  °

## 2021-04-29 NOTE — Therapy (Signed)
[facility] [place], [place], [address] Phone: [phone]   Fax:  [phone]  Physical Therapy Treatment  Patient Details  Name: Christina Lewis MRN: [id number] Date of Birth: 1955/12/01 Referring Provider (PT): Hazel Sams PA-C   Encounter Date: 04/29/2021   PT End of Session - 04/29/21 1100     Visit Number 2    Number of Visits 8    Date for PT Re-Evaluation 05/21/21    Authorization Type [employer]    Authorization Time Period 8 visits requested - check auth    PT Start Time 1039    PT Stop Time 1113    PT Time Calculation (min) 34 min    Activity Tolerance Patient tolerated treatment well    Behavior During Therapy [facility] for tasks assessed/performed             Past Medical History:  Diagnosis Date   Arthritis    Asthma 2007   Depression    Diabetes mellitus    Fibromyalgia    GERD (gastroesophageal reflux disease)    Gout    Hematuria    History of kidney stones    History of tics    Interstitial cystitis 2016   Migraines    Osteoarthritis    PCOS (polycystic ovarian syndrome) 1993   Pneumonia 2019   PONV (postoperative nausea and vomiting)    Sleep apnea    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, took medication for a year   Thyroid nodule     Past Surgical History:  Procedure Laterality Date   ABDOMINAL HYSTERECTOMY  2000   APPENDECTOMY  1976   BREAST LUMPECTOMY  1989   lt-negative   CARDIOVASCULAR STRESS TEST  2000   CATARACT EXTRACTION W/PHACO Left 12/07/2019   Procedure: CATARACT EXTRACTION PHACO AND INTRAOCULAR LENS PLACEMENT ([place]);  Surgeon: Baruch Goldmann, MD;  Location: [facility];  Service: Ophthalmology;  Laterality: Left;  CDE: 5.55   CHOLECYSTECTOMY  1985   COLONOSCOPY     HYSTERECTOMY ABDOMINAL WITH SALPINGECTOMY Bilateral 2000   KIDNEY STONE SURGERY Right 2014   with stent placement in OR   KNEE ARTHROSCOPY Right 08/22/2013   Procedure: ARTHROSCOPY RIGHT  KNEE FOR INFECTION LAVAGE AND DRAINAGE;  Surgeon: Yvette Rack., MD;  Location: [place];  Service: Orthopedics;  Laterality: Right;   KNEE ARTHROSCOPY WITH PATELLA RECONSTRUCTION Right 08/06/2013   Procedure: RIGHT KNEE ARTHROSCOPY WITH MENISCECTOMY MEDIAL, ARTHROSCOPY KNEE WITH DEBRIDEMENT/SHAVING (CHONDROPLASTY) ;  Surgeon: Yvette Rack., MD;  Location: [place];  Service: Orthopedics;  Laterality: Right;   LUMBAR LAMINECTOMY/DECOMPRESSION MICRODISCECTOMY Right 09/11/2019   Procedure: Right Lumbar Three-Four Microdiscectomy;  Surgeon: Erline Levine, MD;  Location: [place];  Service: Neurosurgery;  Laterality: Right;  Right Lumbar Three-Four Microdiscectomy   NASAL SINUS SURGERY     x4   OOPHORECTOMY     SHOULDER SURGERY  2011   right shoulder   THYROID SURGERY  1994   TONSILLECTOMY Bilateral 1974    There were no vitals filed for this visit.   Subjective Assessment - 04/29/21 1042     Subjective Patient reports that she is not able to get a good enough stretch with the seated UT stretch. She states that late on Thursday night she got a horrible pain in her Lt neck and this pain radiated down into her Lt arm and last for about 15 minutes. She attempted to treat with  both heat and a migraine medication, but nothing touched the pain.    Pertinent History Fibromyalgia, chronic neck pain, RA    Limitations Lifting;House hold activities    How long can you stand comfortably? 1 hour    Patient Stated Goals decrease pain    Currently in Pain? Yes    Pain Score 2     Pain Location Neck    Pain Orientation Lower;Left    Pain Descriptors / Indicators Aching    Pain Onset More than a month ago                               [facility] Adult PT Treatment/Exercise - 04/29/21 0001       Neck Exercises: Seated   Other Seated Exercise Armbike(exaggerated scapular motion) x75mn each      Neck Exercises: Supine   Other Supine Exercise Thoracic  extensions over foam roll with paired deep breathing 3x10(3 position adjustments)      Neck Exercises: Stretches   Other Neck Stretches Thread the Needle 8x5s holds each      Manual Therapy   Manual Therapy Soft tissue mobilization    Manual therapy comments Massage gun to lower, middle and upper traps bilaterally x162m                     PT Education - 04/29/21 1415     Education Details HEP    Person(s) Educated Patient    Methods Explanation;Handout;Demonstration    Comprehension Verbalized understanding;Returned demonstration              PT Short Term Goals - 04/23/21 1049       PT SHORT TERM GOAL #1   Title Patient will be independent with HEP in order to optimze functional outcomes.    Time 2    Period Weeks    Status New    Target Date 05/07/21               PT Long Term Goals - 04/23/21 1049       PT LONG TERM GOAL #1   Title Patient will report at least 50% improvement in symptoms for improved quality of life.    Time 4    Period Weeks    Status New    Target Date 05/21/21      PT LONG TERM GOAL #2   Title Patient will improve FOTO score by at least 5 points in order to indicate improved tolerance to activity.    Time 4    Period Weeks    Status New    Target Date 05/21/21      PT LONG TERM GOAL #3   Title Patient will report ability to manage neck pain/headaches with HEP in order to transition to self care.    Time 4    Period Weeks    Status New    Target Date 05/21/21                   Plan - 04/29/21 1416     Clinical Impression Statement Patient tolerated treatment well during today's session and had a reduction in pain level by the end of today's session. Increased tone of the thoracic paraspinals as well as the UTs bilateraly was noted by frequent muscle twitches occurring during the initial use of the massage gun. Of note, the patient was late to today's session.  Personal Factors and Comorbidities  Fitness;Comorbidity 3+;Time since onset of injury/illness/exacerbation    Comorbidities RA, Fibromyalgia, chronic neck pain, migraines    Examination-Activity Limitations Bend;Sit;Sleep;Lift    Examination-Participation Restrictions Meal Prep;Cleaning;Community Activity;Volunteer;Yard Work;Shop;Laundry    Stability/Clinical Decision Making Stable/Uncomplicated    Rehab Potential Good    PT Frequency 2x / week    PT Duration 4 weeks    PT Treatment/Interventions ADLs/Self Care Home Management;Moist Heat;Traction;Ultrasound;DME Instruction;Gait training;Stair training;Functional mobility training;Therapeutic activities;Therapeutic exercise;Balance training;Neuromuscular re-education;Patient/family education;Orthotic Fit/Training;Manual techniques;Manual lymph drainage;Compression bandaging;Scar mobilization;Passive range of motion;Dry needling;Energy conservation;Splinting;Taping    PT Next Visit Plan f/u with HEP, postural strengthening, cervical retractions, improve c/sp and t/sp mobility, manual if needed    PT Home Exercise Plan posture, scap ret, UT stretch    Consulted and Agree with Plan of Care Patient             Patient will benefit from skilled therapeutic intervention in order to improve the following deficits and impairments:  Decreased range of motion, Decreased endurance, Impaired UE functional use, Increased muscle spasms, Decreased activity tolerance, Pain, Impaired flexibility, Improper body mechanics, Decreased mobility, Decreased strength, Postural dysfunction  Visit Diagnosis: Neck pain  Abnormal posture  Other symptoms and signs involving the musculoskeletal system     Problem List Patient Active Problem List   Diagnosis Date Noted   Cellulitis 03/04/2021   Rheumatoid arthritis ([place]) 03/04/2021   Diabetes mellitus type 2 in obese ([place]) 03/04/2021   Right foot infection 03/04/2021   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 myelopathy 09/11/2019   Leukocytosis 11/04/2016    Fibromyalgia 02/10/2016   Other fatigue 02/10/2016   Primary osteoarthritis of both hands 02/10/2016   History of migraine 02/10/2016   History of thyroid nodule 02/10/2016   History of sleep apnea 02/10/2016   History of renal calculi 02/10/2016   Pain in joint, shoulder region 10/02/2010   Adhesive capsulitis of shoulder 10/02/2010   Muscle weakness (generalized) 10/02/2010    Adalberto Cole, PT 04/29/2021, 2:21 PM  [facility] [place] [address] [place], [place], [address] Phone: [phone]   Fax:  [phone]  Name: Christina Lewis MRN: [id number] Date of Birth: April 16, 1955

## 2021-04-29 NOTE — Patient Instructions (Signed)
Thoracic Extension Mobilization on Foam Roll - 3-4 x daily - 7 x weekly - 2 sets - 8 reps - 3s hold ?Quadruped Thoracic Rotation - Reach Under - 3-4 x daily - 7 x weekly - 6 reps - 3s hold ? ?

## 2021-05-05 ENCOUNTER — Ambulatory Visit (HOSPITAL_COMMUNITY): Payer: Medicare PPO

## 2021-05-06 ENCOUNTER — Other Ambulatory Visit: Payer: Self-pay | Admitting: Rheumatology

## 2021-05-06 MED ORDER — TIZANIDINE HCL 4 MG PO TABS: 4.0000 mg | ORAL_TABLET | Freq: Every evening | ORAL | 0 refills | Status: DC | PRN

## 2021-05-06 MED ORDER — BACLOFEN 10 MG PO TABS: ORAL_TABLET | ORAL | 0 refills | Status: DC

## 2021-05-06 NOTE — Telephone Encounter (Signed)
Next Visit: 09/03/2021 ? ?Last Visit: 04/29/2021 ? ?Last Fill: 04/03/2021 ? ?Dx: Fibromyalgia ? ?Current Dose per office note on 04/29/2021: not discussed ? ?Okay to refill Baclofen?  ?

## 2021-05-06 NOTE — Telephone Encounter (Signed)
Next Visit: 09/03/2021 ? ?Last Visit: 04/29/2021 ? ?Last Fill: 04/24/2021 ? ?Dx: Fibromyalgia ? ?Current Dose per office note on 04/29/2021: not discussed ? ?Okay to refill Tizanidine?   ?

## 2021-05-07 ENCOUNTER — Telehealth (HOSPITAL_COMMUNITY): Payer: Self-pay

## 2021-05-07 ENCOUNTER — Ambulatory Visit (HOSPITAL_COMMUNITY): Payer: Medicare PPO

## 2021-05-07 NOTE — Therapy (Incomplete)
?OUTPATIENT PHYSICAL THERAPY TREATMENT NOTE ? ? ?Patient Name: Christina Lewis ?MRN: [id number] ?DOB:[birth year], 66 y.o., female ?Today's Date: 05/07/2021 ? ?PCP: Kirstie PeriShah, Ashish, MD ?REFERRING PROVIDER: Kirstie PeriShah, Ashish, MD ? ? ? ?Past Medical History:  ?Diagnosis Date  ? Arthritis   ? Asthma 2007  ? Depression   ? Diabetes mellitus   ? Fibromyalgia   ? GERD (gastroesophageal reflux disease)   ? Gout   ? Hematuria   ? History of kidney stones   ? History of tics   ? Interstitial cystitis 2016  ? Migraines   ? Osteoarthritis   ? PCOS (polycystic ovarian syndrome) 1993  ? Pneumonia 2019  ? PONV (postoperative nausea and vomiting)   ? Sleep apnea   ? TB (pulmonary tuberculosis)   ? 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 Thyroid nodule   ? ?Past Surgical History:  ?Procedure Laterality Date  ? ABDOMINAL HYSTERECTOMY  2000  ? APPENDECTOMY  1976  ? BREAST LUMPECTOMY  1989  ? lt-negative  ? CARDIOVASCULAR STRESS TEST  2000  ? CATARACT EXTRACTION W/PHACO Left 12/07/2019  ? Procedure: CATARACT EXTRACTION PHACO AND INTRAOCULAR LENS PLACEMENT (IOC);  Surgeon: Fabio PierceWrzosek, James, MD;  Location: [facility];  Service: Ophthalmology;  Laterality: Left;  CDE: 5.55  ? CHOLECYSTECTOMY  1985  ? COLONOSCOPY    ? HYSTERECTOMY ABDOMINAL WITH SALPINGECTOMY Bilateral 2000  ? KIDNEY STONE SURGERY Right 2014  ? with stent placement in OR  ? KNEE ARTHROSCOPY Right 08/22/2013  ? Procedure: ARTHROSCOPY RIGHT KNEE FOR INFECTION LAVAGE AND DRAINAGE;  Surgeon: Thera FlakeW D Caffrey Jr., MD;  Location: [facility];  Service: Orthopedics;  Laterality: Right;  ? KNEE ARTHROSCOPY WITH PATELLA RECONSTRUCTION Right 08/06/2013  ? Procedure: RIGHT KNEE ARTHROSCOPY WITH MENISCECTOMY MEDIAL, ARTHROSCOPY KNEE WITH DEBRIDEMENT/SHAVING (CHONDROPLASTY) ;  Surgeon: Thera FlakeW D Caffrey Jr., MD;  Location: [facility];  Service: Orthopedics;  Laterality: Right;  ? LUMBAR LAMINECTOMY/DECOMPRESSION MICRODISCECTOMY Right 09/11/2019  ? Procedure: Right Lumbar  Three-Four Microdiscectomy;  Surgeon: Maeola HarmanStern, Joseph, MD;  [facility];  Service: Neurosurgery;  Laterality: Right;  Right Lumbar Three-Four Microdiscectomy  ? NASAL SINUS SURGERY    ? x4  ? OOPHORECTOMY    ? SHOULDER SURGERY  2011  ? right shoulder  ? THYROID SURGERY  1994  ? TONSILLECTOMY Bilateral 1974  ? ?Patient Active Problem List  ? Diagnosis Date Noted  ? Cellulitis 03/04/2021  ? Rheumatoid arthritis (HCC) 03/04/2021  ? Diabetes mellitus type 2 in obese (HCC) 03/04/2021  ? Right foot infection 03/04/2021  ? 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 myelopathy 09/11/2019  ? Leukocytosis 11/04/2016  ? Fibromyalgia 02/10/2016  ? Other fatigue 02/10/2016  ? Primary osteoarthritis of both hands 02/10/2016  ? History of migraine 02/10/2016  ? History of thyroid nodule 02/10/2016  ? History of sleep apnea 02/10/2016  ? History of renal calculi 02/10/2016  ? Pain in joint, shoulder region 10/02/2010  ? Adhesive capsulitis of shoulder 10/02/2010  ? Muscle weakness (generalized) 10/02/2010  ? ? ?REFERRING DIAG: *** ? ?THERAPY DIAG:  ?No diagnosis found. ? ?PERTINENT HISTORY: *** ? ?PRECAUTIONS: *** ? ?SUBJECTIVE: *** ? ?PAIN:  ?Are you having pain? {OPRCPAIN:27236} ? ? ? ? ?TODAY'S TREATMENT:  ?*** ? ? ?PATIENT EDUCATION: ?Education details: *** ?Person educated: {Person educated:25204} ?Education method: {Education Method:25205} ?Education comprehension: {Education Comprehension:25206} ? ? ?HOME EXERCISE PROGRAM: ?*** ? ? PT Short Term Goals - 04/23/21 1049   ? ?  ? PT SHORT TERM GOAL #1  ?  Title Patient will be independent with HEP in order to optimze functional outcomes.   ? Time 2   ? Period Weeks   ? Status New   ? Target Date 05/07/21   ? ?  ?  ? ?  ? ? ? PT Long Term Goals - 04/23/21 1049   ? ?  ? PT LONG TERM GOAL #1  ? Title Patient will report at least 50% improvement in symptoms for improved quality of life.   ? Time 4   ? Period Weeks   ? Status New   ? Target Date 05/21/21   ?  ? PT LONG TERM GOAL #2  ? Title  Patient will improve FOTO score by at least 5 points in order to indicate improved tolerance to activity.   ? Time 4   ? Period Weeks   ? Status New   ? Target Date 05/21/21   ?  ? PT LONG TERM GOAL #3  ? Title Patient will report ability to manage neck pain/headaches with HEP in order to transition to self care.   ? Time 4   ? Period Weeks   ? Status New   ? Target Date 05/21/21   ? ?  ?  ? ?  ? ? ? ? ? ? ?Marisue Brooklyn, PT ?05/07/2021, 8:09 AM ? ?  ? ?

## 2021-05-07 NOTE — Telephone Encounter (Signed)
Patient has a migrain headache - husband call to cx today for her. 05/07/2021 ?

## 2021-05-08 NOTE — Therapy (Incomplete)
?OUTPATIENT PHYSICAL THERAPY TREATMENT NOTE ? ? ?Patient Name: Christina Lewis ?MRN: [id number] ?DOB:12/13/1955, 66 y.o., female ?Today's Date: 05/08/2021 ? ?PCP: Monico Blitz, MD ?REFERRING PROVIDER: Monico Blitz, MD ? ? ? ?Past Medical History:  ?Diagnosis Date  ? Arthritis   ? Asthma 2007  ? Depression   ? Diabetes mellitus   ? Fibromyalgia   ? GERD (gastroesophageal reflux disease)   ? Gout   ? Hematuria   ? History of kidney stones   ? History of tics   ? Interstitial cystitis 2016  ? Migraines   ? Osteoarthritis   ? PCOS (polycystic ovarian syndrome) 1993  ? Pneumonia 2019  ? PONV (postoperative nausea and vomiting)   ? Sleep apnea   ? TB (pulmonary tuberculosis)   ? 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 Thyroid nodule   ? ?Past Surgical History:  ?Procedure Laterality Date  ? ABDOMINAL HYSTERECTOMY  2000  ? APPENDECTOMY  1976  ? BREAST LUMPECTOMY  1989  ? lt-negative  ? CARDIOVASCULAR STRESS TEST  2000  ? CATARACT EXTRACTION W/PHACO Left 12/07/2019  ? Procedure: CATARACT EXTRACTION PHACO AND INTRAOCULAR LENS PLACEMENT (IOC);  Surgeon: Baruch Goldmann, MD;  Location: [facility];  Service: Ophthalmology;  Laterality: Left;  CDE: 5.55  ? CHOLECYSTECTOMY  1985  ? COLONOSCOPY    ? HYSTERECTOMY ABDOMINAL WITH SALPINGECTOMY Bilateral 2000  ? KIDNEY STONE SURGERY Right 2014  ? with stent placement in OR  ? KNEE ARTHROSCOPY Right 08/22/2013  ? Procedure: ARTHROSCOPY RIGHT KNEE FOR INFECTION LAVAGE AND DRAINAGE;  Surgeon: Yvette Rack., MD;  Location: [place] [facility];  Service: Orthopedics;  Laterality: Right;  ? KNEE ARTHROSCOPY WITH PATELLA RECONSTRUCTION Right 08/06/2013  ? Procedure: RIGHT KNEE ARTHROSCOPY WITH MENISCECTOMY MEDIAL, ARTHROSCOPY KNEE WITH DEBRIDEMENT/SHAVING (CHONDROPLASTY) ;  Surgeon: Yvette Rack., MD;  Location: [employer] [facility];  Service: Orthopedics;  Laterality: Right;  ? LUMBAR LAMINECTOMY/DECOMPRESSION MICRODISCECTOMY Right 09/11/2019  ? Procedure: Right Lumbar  Three-Four Microdiscectomy;  Surgeon: Erline Levine, MD;  Location: [place];  Service: Neurosurgery;  Laterality: Right;  Right Lumbar Three-Four Microdiscectomy  ? NASAL SINUS SURGERY    ? x4  ? OOPHORECTOMY    ? SHOULDER SURGERY  2011  ? right shoulder  ? THYROID SURGERY  1994  ? TONSILLECTOMY Bilateral 1974  ? ?Patient Active Problem List  ? Diagnosis Date Noted  ? Cellulitis 03/04/2021  ? Rheumatoid arthritis ([place]) 03/04/2021  ? Diabetes mellitus type 2 in obese ([place]) 03/04/2021  ? Right foot infection 03/04/2021  ? 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 myelopathy 09/11/2019  ? Leukocytosis 11/04/2016  ? Fibromyalgia 02/10/2016  ? Other fatigue 02/10/2016  ? Primary osteoarthritis of both hands 02/10/2016  ? History of migraine 02/10/2016  ? History of thyroid nodule 02/10/2016  ? History of sleep apnea 02/10/2016  ? History of renal calculi 02/10/2016  ? Pain in joint, shoulder region 10/02/2010  ? Adhesive capsulitis of shoulder 10/02/2010  ? Muscle weakness (generalized) 10/02/2010  ? ? ?REFERRING DIAG: *** ? ?THERAPY DIAG:  ?No diagnosis found. ? ?PERTINENT HISTORY: *** ? ?PRECAUTIONS: *** ? ?SUBJECTIVE: *** ? ?PAIN:  ?Are you having pain? {OPRCPAIN:27236} ? ? ? ? ?TODAY'S TREATMENT:  ?*** ? ? ?PATIENT EDUCATION: ?Education details: *** ?Person educated: {Person educated:25204} ?Education method: {Education Method:25205} ?Education comprehension: {Education Comprehension:25206} ? ? ?HOME EXERCISE PROGRAM: ?*** ? ? PT Short Term Goals - 04/23/21 1049   ? ?  ? PT SHORT TERM GOAL #1  ?  Title Patient will be independent with HEP in order to optimze functional outcomes.   ? Time 2   ? Period Weeks   ? Status New   ? Target Date 05/07/21   ? ?  ?  ? ?  ? ? ? PT Long Term Goals - 04/23/21 1049   ? ?  ? PT LONG TERM GOAL #1  ? Title Patient will report at least 50% improvement in symptoms for improved quality of life.   ? Time 4   ? Period Weeks   ? Status New   ? Target Date 05/21/21   ?  ? PT LONG TERM GOAL #2  ? Title  Patient will improve FOTO score by at least 5 points in order to indicate improved tolerance to activity.   ? Time 4   ? Period Weeks   ? Status New   ? Target Date 05/21/21   ?  ? PT LONG TERM GOAL #3  ? Title Patient will report ability to manage neck pain/headaches with HEP in order to transition to self care.   ? Time 4   ? Period Weeks   ? Status New   ? Target Date 05/21/21   ? ?  ?  ? ?  ? ?Clinical Impression Statement Patient tolerated treatment well during today's session and had a reduction in pain level by the end of today's session. Increased tone of the thoracic paraspinals as well as the UTs bilateraly was noted by frequent muscle twitches occurring during the initial use of the massage gun. Of note, the patient was late to today's session.    ?  Personal Factors and Comorbidities Fitness;Comorbidity 3+;Time since onset of injury/illness/exacerbation   ?  Comorbidities RA, Fibromyalgia, chronic neck pain, migraines   ?  Examination-Activity Limitations Bend;Sit;Sleep;Lift   ?  Examination-Participation Restrictions Meal Prep;Cleaning;Community Activity;Volunteer;Yard Work;Shop;Laundry   ?  Stability/Clinical Decision Making Stable/Uncomplicated   ?  Rehab Potential Good   ?  PT Frequency 2x / week   ?  PT Duration 4 weeks   ?  PT Treatment/Interventions ADLs/Self Care Home Management;Moist Heat;Traction;Ultrasound;DME Instruction;Gait training;Stair training;Functional mobility training;Therapeutic activities;Therapeutic exercise;Balance training;Neuromuscular re-education;Patient/family education;Orthotic Fit/Training;Manual techniques;Manual lymph drainage;Compression bandaging;Scar mobilization;Passive range of motion;Dry needling;Energy conservation;Splinting;Taping   ?  PT Next Visit Plan f/u with HEP, postural strengthening, cervical retractions, improve c/sp and t/sp mobility, manual if needed   ? ? ? ? ? ?Ardis Rowan, PT ?05/08/2021, 8:10 AM ? ?  ? ?

## 2021-05-11 ENCOUNTER — Encounter (HOSPITAL_COMMUNITY): Payer: Medicare PPO

## 2021-05-12 ENCOUNTER — Other Ambulatory Visit (HOSPITAL_COMMUNITY): Payer: Self-pay | Admitting: Internal Medicine

## 2021-05-12 DIAGNOSIS — Z1231 Encounter for screening mammogram for malignant neoplasm of breast: Secondary | ICD-10-CM

## 2021-05-13 ENCOUNTER — Encounter (HOSPITAL_COMMUNITY): Payer: Medicare PPO

## 2021-05-18 ENCOUNTER — Inpatient Hospital Stay (HOSPITAL_COMMUNITY): Admission: RE | Admit: 2021-05-18 | Payer: Medicare PPO | Source: Ambulatory Visit

## 2021-05-18 ENCOUNTER — Encounter (HOSPITAL_COMMUNITY): Payer: Self-pay

## 2021-05-18 DIAGNOSIS — Z1231 Encounter for screening mammogram for malignant neoplasm of breast: Secondary | ICD-10-CM

## 2021-05-19 ENCOUNTER — Encounter (HOSPITAL_COMMUNITY): Payer: Medicare PPO

## 2021-05-21 ENCOUNTER — Ambulatory Visit (HOSPITAL_COMMUNITY)
Admission: RE | Admit: 2021-05-21 | Discharge: 2021-05-21 | Disposition: A | Discharge status: Home / Self Care | Payer: Medicare PPO | Source: Ambulatory Visit | Attending: Rheumatology | Admitting: Rheumatology

## 2021-05-21 ENCOUNTER — Encounter (HOSPITAL_COMMUNITY): Payer: Medicare PPO | Admitting: Physical Therapy

## 2021-05-21 ENCOUNTER — Other Ambulatory Visit: Payer: Self-pay | Admitting: *Deleted

## 2021-05-21 DIAGNOSIS — M5030 Other cervical disc degeneration, unspecified cervical region: Secondary | ICD-10-CM | POA: Diagnosis not present

## 2021-05-21 DIAGNOSIS — M542 Cervicalgia: Secondary | ICD-10-CM | POA: Insufficient documentation

## 2021-05-21 NOTE — Progress Notes (Signed)
I discussed MRI results with the patient.  I discussed the option of physical therapy versus referral to Dr. Alvester MorinNewton.  She would prefer to see Dr. Alvester MorinNewton.  Please place a referral to Dr. Alvester MorinNewton.

## 2021-06-01 ENCOUNTER — Encounter: Payer: Self-pay | Admitting: Physical Medicine and Rehabilitation

## 2021-06-01 ENCOUNTER — Ambulatory Visit: Payer: Medicare PPO | Admitting: Physical Medicine and Rehabilitation

## 2021-06-01 VITALS — BP 131/82 | HR 88

## 2021-06-01 DIAGNOSIS — M5416 Radiculopathy, lumbar region: Secondary | ICD-10-CM | POA: Diagnosis not present

## 2021-06-01 DIAGNOSIS — Z6831 Body mass index (BMI) 31.0-31.9, adult: Secondary | ICD-10-CM | POA: Diagnosis not present

## 2021-06-01 DIAGNOSIS — M542 Cervicalgia: Secondary | ICD-10-CM | POA: Diagnosis not present

## 2021-06-01 DIAGNOSIS — M797 Fibromyalgia: Secondary | ICD-10-CM | POA: Diagnosis not present

## 2021-06-01 DIAGNOSIS — M7918 Myalgia, other site: Secondary | ICD-10-CM | POA: Diagnosis not present

## 2021-06-01 NOTE — Progress Notes (Signed)
? ?LAYLONNIE Lewis - 66 y.o. female MRN [id number]  Date of birth: Oct 27, 1955 ? ?Office Visit Note: ?Visit Date: 06/01/2021 ?PCP: Monico Blitz, MD ?Referred by: Monico Blitz, MD ? ?Subjective: ?Chief Complaint  ?Patient presents with  ? Neck - Pain  ? Left Shoulder - Pain  ? Left Hand - Pain  ? ?HPI: Christina Lewis is a 66 y.o. female who comes in today Per the request of Dr. Bo Merino for evaluation of chronic, worsening and severe bilateral neck pain with intermittent radiation to left shoulder/upper arm.  Patient reports pain has been ongoing for a month and a half and is exacerbated by movement and activity.  She describes her pain as a sore and tight sensation, currently rates a 7 out of 10.  Patient reports some relief with pain with home exercise regimen, use of ice/heat and medications as needed.  Patient states she is currently taking Meloxicam with little relief of pain.  Patient states she recently started formal physical therapy at [facility], but has not returned for further therapy.  Patient's recent cervical MRI exhibits mild cervical spondylosis, mild left foraminal narrowing at C3-C4 and C5-C6.  No high-grade spinal canal stenosis noted.  Patient states she feels like her neck is very tight and sore, states she has difficulty sleeping at night.  Patient states the bilateral neck pain is the most severe discomfort she experiences, she reports intermittent radiation to shoulder and upper arm but states this pain is very mild.  Patient is currently being treated by Dr. Estanislado Pandy for rheumatoid arthritis and fibromyalgia.  Patient does have a history of migraine headaches and is currently being managed by Dr. Rip Harbour in [place].  Patient states she feels like her chronic bilateral neck pain is exacerbating her headaches.  Patient is also currently being treated by Dr. Lenord Carbo at [facility] where she has had multiple lumbar epidural  steroid injections with some relief of pain. patient denies focal weakness, numbness and tingling.  Patient denies recent trauma or falls. ? ?Review of Systems  ?Musculoskeletal:  Positive for myalgias and neck pain.  ?Neurological:  Negative for tingling, sensory change, focal weakness and weakness.  ?All other systems reviewed and are negative. Otherwise per HPI. ? ?Assessment & Plan: ?Visit Diagnoses:  ?  ICD-10-CM   ?1. Cervicalgia  M54.2 Ambulatory referral to Physical Therapy  ?  ?2. Bilateral neck pain  M54.2 Ambulatory referral to Physical Therapy  ?  ?3. Myofascial pain syndrome  M79.18 Ambulatory referral to Physical Therapy  ?  ?4. Fibromyalgia  M79.7 Ambulatory referral to Physical Therapy  ?  ?   ?Plan: Findings:  ?Chronic, worsening and severe bilateral neck pain radiating to left shoulder and upper arm.  Patient continues to have severe pain despite good conservative therapy such as formal physical therapy, home exercise regimen, use of ice/heat and medications.  Patient's clinical presentation and exam are consistent with myofascial pain, she does have multiple palpable trigger points noted to bilateral trapezius and rhomboid regions.  Patient is extremely tender to palpation to bilateral trapezius and rhomboid regions upon exam today.  I also feel that patient's fibromyalgia could be working to exacerbate her pain.  Patient's recent cervical MRI does not exhibit any findings that would correlate with her symptoms and looks good for her age.  Patient has attended 1 session of physical therapy, however she has not undergone manual treatments or dry needling.  We believe the next step  is to regroup with our in-house physical therapy team, would recommend dry needling sessions.  I would like for patient to go consistently for several weeks as I feel this could be beneficial for her.  If patient's pain persists following physical therapy treatments we would recommend performing cervical epidural steroid  injections, which we would be happy to do or refer back to Dr. Lorrine Kin. I did place an order today to start physical therapy with our in-house team and I will also recommended [facility]. No red flag symptoms noted upon exam today.   ? ?Meds & Orders: No orders of the defined types were placed in this encounter. ?  ?Orders Placed This Encounter  ?Procedures  ? Ambulatory referral to Physical Therapy  ?  ?Follow-up: Return if symptoms worsen or fail to improve.  ? ?Procedures: ?No procedures performed  ?   ? ?Clinical History: ?EXAM: ?MRI CERVICAL SPINE WITHOUT CONTRAST ?  ?TECHNIQUE: ?Multiplanar, multisequence MR imaging of the cervical spine was ?performed. No intravenous contrast was administered. ?  ?COMPARISON:  Cervical radiographs 04/07/2021 ?  ?FINDINGS: ?Alignment: Normal ?  ?Vertebrae: Normal bone marrow.  Negative for fracture or mass. ?  ?Cord: Normal signal and morphology ?  ?Posterior Fossa, vertebral arteries, paraspinal tissues: Negative ?  ?Disc levels: ?  ?C2-3: Mild uncinate spurring on the left without significant spinal ?or foraminal stenosis. ?  ?C3-4: Bilateral facet degeneration. Mild left foraminal narrowing. ?Right foramen patent ?  ?C4-5: Mild disc degeneration and spurring. No significant stenosis. ?Mild facet degeneration. ?  ?C5-6: Disc degeneration with uncinate spurring. Mild facet ?degeneration. Mild left foraminal narrowing. Right foramen patent. ?  ?C6-7: Disc degeneration and mild spurring. Small central disc ?protrusion. No significant spinal or foraminal stenosis ?  ?C7-T1: Negative ?  ?T1-2: Small central disc protrusion without stenosis. ?  ?IMPRESSION: ?Mild cervical spondylosis. Mild left foraminal narrowing C3-4 and ?C5-6. ?  ?  ?Electronically Signed ?  By: Marlan Palau M.D. ?  On: 05/21/2021 15:12  ? ?She reports that she has never smoked. She has never used smokeless tobacco.  ?Recent Labs  ?  03/04/21 ?0737  ?HGBA1C 6.7*  ? ? ?Objective:  VS:  HT:     WT:   BMI:     BP:131/82  HR:88bpm  TEMP: ( )  RESP:  ?Physical Exam ?Vitals and nursing note reviewed.  ?HENT:  ?   Head: Normocephalic and atraumatic.  ?   Right Ear: External ear normal.  ?   Left Ear: External ear normal.  ?   Nose: Nose normal.  ?   Mouth/Throat:  ?   Mouth: Mucous membranes are moist.  ?Eyes:  ?   Extraocular Movements: Extraocular movements intact.  ?Cardiovascular:  ?   Rate and Rhythm: Normal rate.  ?   Pulses: Normal pulses.  ?Pulmonary:  ?   Effort: Pulmonary effort is normal.  ?Abdominal:  ?   General: Abdomen is flat. There is no distension.  ?Musculoskeletal:     ?   General: Tenderness present.  ?   Cervical back: Tenderness present.  ?   Comments: Discomfort noted with flexion, extension and side-to-side rotation. Patient has good strength in the upper extremities including 5 out of 5 strength in wrist extension, long finger flexion and APB.  There is no atrophy of the hands intrinsically. Palpable trigger points noted to bilateral trapezius and rhomboid regions.  Sensation intact bilaterally. Negative Hoffman's sign.   ?Skin: ?   General: Skin is  warm and dry.  ?   Capillary Refill: Capillary refill takes less than 2 seconds.  ?Neurological:  ?   General: No focal deficit present.  ?   Mental Status: She is alert and oriented to person, place, and time.  ?Psychiatric:     ?   Mood and Affect: Mood normal.     ?   Behavior: Behavior normal.  ?  ?Ortho Exam ? ?Imaging: ?No results found. ? ?Past Medical/Family/Surgical/Social History: ?Medications & Allergies reviewed per EMR, new medications updated. ?Patient Active Problem List  ? Diagnosis Date Noted  ? Cellulitis 03/04/2021  ? Rheumatoid arthritis ([place]) 03/04/2021  ? Diabetes mellitus type 2 in obese ([place]) 03/04/2021  ? Right foot infection 03/04/2021  ? 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 myelopathy 09/11/2019  ? Leukocytosis 11/04/2016  ? Fibromyalgia 02/10/2016  ? Other fatigue 02/10/2016  ? Primary osteoarthritis of both hands  02/10/2016  ? History of migraine 02/10/2016  ? History of thyroid nodule 02/10/2016  ? History of sleep apnea 02/10/2016  ? History of renal calculi 02/10/2016  ? Pain in joint, shoulder region 10/02/2010

## 2021-06-01 NOTE — Progress Notes (Signed)
Pt state neck pain that travels to hr left shoulder and down to her hand. Pt state lifting up her arm, bending over and lying on her left side makes the pain worse. Pt state she takes pain meds and uses pain cream to help ease her pain. ? ?Numeric Pain Rating Scale and Functional Assessment ?Average Pain 9 ?Pain Right Now 7 ?My pain is intermittent, sharp, burning, dull, stabbing, and aching ?Pain is worse with: bending, some activites, and laying down ?Pain improves with: heat/ice and medication ? ? ?In the last MONTH (on 0-10 scale) has pain interfered with the following? ? ?1. General activity like being  able to carry out your everyday physical activities such as walking, climbing stairs, carrying groceries, or moving a chair?  ?Rating(6) ? ?2. Relation with others like being able to carry out your usual social activities and roles such as  activities at home, at work and in your community. ?Rating(7) ? ?3. Enjoyment of life such that you have  been bothered by emotional problems such as feeling anxious, depressed or irritable?  ?Rating(8) ? ?

## 2021-06-04 ENCOUNTER — Other Ambulatory Visit: Payer: Self-pay | Admitting: Physician Assistant

## 2021-06-04 ENCOUNTER — Other Ambulatory Visit: Payer: Self-pay | Admitting: Rheumatology

## 2021-06-04 DIAGNOSIS — M0609 Rheumatoid arthritis without rheumatoid factor, multiple sites: Secondary | ICD-10-CM

## 2021-06-05 NOTE — Telephone Encounter (Signed)
Ok to provided 90 day supply

## 2021-06-05 NOTE — Telephone Encounter (Signed)
Next Visit: 09/03/2021 ?  ?Last Visit: 04/29/2021 ?  ?Last Fill: 05/06/2021 ?  ?Dx: Fibromyalgia ?  ?Current Dose per office note on 04/29/2021: not discussed ?  ?Okay to refill Baclofen?  ?

## 2021-06-05 NOTE — Telephone Encounter (Signed)
Next Visit: 09/03/2021 ?  ?Last Visit: 04/29/2021 ?  ?Last Fill: 04/07/2021 ? ?Labs: 03/05/2021 WBC 15.5, RBC 3.53, Hgb 10.7, Hct 32.8, Neutro Abs 10.6, Monocytes Absolute 1.2, Abs Immature Granulocytes 0.45, Glucose 141, Total Protein 6.1, Albumin 3.4,  ? ?Eye exam: 02/12/2021 Normal  ? ?Current Dose per office note 04/29/2021: Plaquenil 200 mg 1 tablet by mouth twice daily Monday through Friday ? ?OZ:HYQMVHQION arthritis of multiple sites with negative rheumatoid factor  ? ?Okay to refill Plaquenil?  ?

## 2021-06-08 ENCOUNTER — Ambulatory Visit: Payer: Medicare PPO | Admitting: Physical Therapy

## 2021-06-08 ENCOUNTER — Encounter: Payer: Self-pay | Admitting: Physical Therapy

## 2021-06-08 DIAGNOSIS — M5441 Lumbago with sciatica, right side: Secondary | ICD-10-CM

## 2021-06-08 DIAGNOSIS — R293 Abnormal posture: Secondary | ICD-10-CM

## 2021-06-08 DIAGNOSIS — R29898 Other symptoms and signs involving the musculoskeletal system: Secondary | ICD-10-CM

## 2021-06-08 DIAGNOSIS — G8929 Other chronic pain: Secondary | ICD-10-CM

## 2021-06-08 DIAGNOSIS — M542 Cervicalgia: Secondary | ICD-10-CM | POA: Diagnosis not present

## 2021-06-08 DIAGNOSIS — Z6830 Body mass index (BMI) 30.0-30.9, adult: Secondary | ICD-10-CM | POA: Diagnosis not present

## 2021-06-08 DIAGNOSIS — M5416 Radiculopathy, lumbar region: Secondary | ICD-10-CM | POA: Diagnosis not present

## 2021-06-08 NOTE — Therapy (Addendum)
?OUTPATIENT PHYSICAL THERAPY CERVICAL EVALUATION ? ? ?Patient Name: Christina Lewis ?MRN: [id number] ?DOB:29-Oct-1955, 66 y.o., female ?Today's Date: 06/08/2021 ? ? PT End of Session - 06/08/21 1116   ? ? Visit Number 1   ? Number of Visits 8   ? Date for PT Re-Evaluation 07/24/21   ? Authorization Type [employer]   ? Authorization Time Period 8 visits requested   ? PT Start Time 1105   ? PT Stop Time 1145   ? PT Time Calculation (min) 40 min   ? Activity Tolerance Patient tolerated treatment well   ? [facility] for tasks assessed/performed   ? ?  ?  ? ?  ? ? ?Past Medical History:  ?Diagnosis Date  ? Arthritis   ? Asthma 2007  ? Depression   ? Diabetes mellitus   ? Fibromyalgia   ? GERD (gastroesophageal reflux disease)   ? Gout   ? Hematuria   ? History of kidney stones   ? History of tics   ? Interstitial cystitis 2016  ? Migraines   ? Osteoarthritis   ? PCOS (polycystic ovarian syndrome) 1993  ? Pneumonia 2019  ? PONV (postoperative nausea and vomiting)   ? Sleep apnea   ? TB (pulmonary tuberculosis)   ? 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 Thyroid nodule   ? ?Past Surgical History:  ?Procedure Laterality Date  ? ABDOMINAL HYSTERECTOMY  2000  ? APPENDECTOMY  1976  ? BREAST LUMPECTOMY  1989  ? lt-negative  ? CARDIOVASCULAR STRESS TEST  2000  ? CATARACT EXTRACTION W/PHACO Left 12/07/2019  ? Procedure: CATARACT EXTRACTION PHACO AND INTRAOCULAR LENS PLACEMENT (IOC);  Surgeon: Fabio PierceWrzosek, James, MD;  Location: [facility];  Service: Ophthalmology;  Laterality: Left;  CDE: 5.55  ? CHOLECYSTECTOMY  1985  ? COLONOSCOPY    ? HYSTERECTOMY ABDOMINAL WITH SALPINGECTOMY Bilateral 2000  ? KIDNEY STONE SURGERY Right 2014  ? with stent placement in OR  ? KNEE ARTHROSCOPY Right 08/22/2013  ? Procedure: ARTHROSCOPY RIGHT KNEE FOR INFECTION LAVAGE AND DRAINAGE;  Surgeon: Thera FlakeW D Caffrey Jr., MD;  Location: [facility];  Service: Orthopedics;  Laterality: Right;  ? KNEE ARTHROSCOPY WITH PATELLA  RECONSTRUCTION Right 08/06/2013  ? Procedure: RIGHT KNEE ARTHROSCOPY WITH MENISCECTOMY MEDIAL, ARTHROSCOPY KNEE WITH DEBRIDEMENT/SHAVING (CHONDROPLASTY) ;  Surgeon: Thera FlakeW D Caffrey Jr., MD;  Location: [facility];  Service: Orthopedics;  Laterality: Right;  ? LUMBAR LAMINECTOMY/DECOMPRESSION MICRODISCECTOMY Right 09/11/2019  ? Procedure: Right Lumbar Three-Four Microdiscectomy;  Surgeon: Maeola HarmanStern, Joseph, MD;  [facility];  Service: Neurosurgery;  Laterality: Right;  Right Lumbar Three-Four Microdiscectomy  ? NASAL SINUS SURGERY    ? x4  ? OOPHORECTOMY    ? SHOULDER SURGERY  2011  ? right shoulder  ? THYROID SURGERY  1994  ? TONSILLECTOMY Bilateral 1974  ? ?Patient Active Problem List  ? Diagnosis Date Noted  ? Cellulitis 03/04/2021  ? Rheumatoid arthritis (HCC) 03/04/2021  ? Diabetes mellitus type 2 in obese (HCC) 03/04/2021  ? Right foot infection 03/04/2021  ? 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 myelopathy 09/11/2019  ? Leukocytosis 11/04/2016  ? Fibromyalgia 02/10/2016  ? Other fatigue 02/10/2016  ? Primary osteoarthritis of both hands 02/10/2016  ? History of migraine 02/10/2016  ? History of thyroid nodule 02/10/2016  ? History of sleep apnea 02/10/2016  ? History of renal calculi 02/10/2016  ? Pain in joint, shoulder region 10/02/2010  ? Adhesive capsulitis of shoulder 10/02/2010  ?  Muscle weakness (generalized) 10/02/2010  ? ? ?PCP: Kirstie Peri, MD ? ?REFERRING PROVIDER: Juanda Chance, NP ? ?REFERRING DIAG: M54.2 (ICD-10-CM) - Cervicalgia ?M54.2 (ICD-10-CM) - Bilateral neck pain ?M79.18 (ICD-10-CM) - Myofascial pain syndrome ?M79.7 (ICD-10-CM) - Fibromyalgia ? ?THERAPY DIAG:  ?Neck pain ? ?Abnormal posture ? ?Other symptoms and signs involving the musculoskeletal system ? ?Chronic right-sided low back pain with right-sided sciatica ? ?ONSET DATE: ongoing for the last couple of years ? ?SUBJECTIVE:                                                                                                                                                                                                         ? ?SUBJECTIVE STATEMENT: ?Pt arriving today reporting chronic pain in her neck and low back. Pt stating her pain is worse on her left side neck and Rt side low back. Pt stating she tried therapy before but feels she needs more manual interventions instead of just exercises. Pt wants to try DN. ? ?PERTINENT HISTORY:  ?asthma, arthritis, fibromyalgia, OA, gout, GERD, sleep apnea, migraines, Rt knee arthroscopy with patella reconstruction ? ?PAIN:  ?Are you having pain? Yes: NPRS scale: 6-7/10 ?Pain location: neck and low back ?Pain description: achy, burning at times ?Aggravating factors: sitting prolonged, bending ?Relieving factors: "nothing" ? ?PRECAUTIONS: None ? ?WEIGHT BEARING RESTRICTIONS No ? ?FALLS:  ?Has patient fallen in last 6 months? No ? ?LIVING ENVIRONMENT: ?Lives with: lives with their family ?Lives in: House/apartment ? ? ?OCCUPATION: retired Runner, broadcasting/film/video ? ?PLOF: Independent ? ?PATIENT GOALS:   stop hurting ? ?OBJECTIVE:  ? ?DIAGNOSTIC FINDINGS:  ?Mild cervical spondylosis. Mild left foraminal narrowing C3-4 and ?C5-6. ? ?PATIENT SURVEYS:  ?06/08/2021: FOTO 47% (predicted 56%) ? ? ?COGNITION: ?Overall cognitive status: Within functional limits for tasks assessed ? ? ?SENSATION: ?WFL ? ?POSTURE:  ?Forward head and rounded shoulders, elevated shoulders with lateral cervical side bending to the left ? ?PALPATION: ?TTP: active trigger points in left upper traps and tender  ? ?CERVICAL ROM:  ? ?Active ROM A/PROM (deg) ?06/08/2021  ?Flexion 12  ?Extension 20  ?Right lateral flexion 20  ?Left lateral flexion 26  ?Right rotation 58  ?Left rotation 55  ? (Blank rows = not tested) ? ?UE ROM: ? ?Active ROM Right ?06/08/2021 Left ?06/08/2021  ?Shoulder flexion [facility] WFL  ?Shoulder extension [facility] WFL  ?Shoulder abduction [facility] WFL  ?Shoulder adduction    ?Shoulder extension    ?Shoulder internal rotation    ?Shoulder external rotation     ? (Blank rows = not tested) ? ?UE  MMT: ? ?MMT Right ?06/08/2021 Left ?06/08/2021  ?Shoulder flexion 4/5 4/5  ?Shoulder extension 4/5 4/5  ?Shoulder abduction 4/5 4/5  ?Shoulder adduction    ?Shoulder extension    ?Shoulder internal rotation 4/5 4/5  ?Shoulder external rotation 4/5 4/5  ?Middle trapezius    ?Lower trapezius    ?Elbow flexion    ?Elbow extension    ?Wrist flexion    ?Wrist extension    ?Wrist ulnar deviation    ?Wrist radial deviation    ?Wrist pronation    ?Wrist supination    ?Grip strength    ? (Blank rows = not tested) ? ?FUNCTIONAL TESTS:  ?5 times sit to stand: 20 seconds with UE support ? ?PATIENT SURVEYS:  ?FOTO 47% on 06/08/2021 (predicted 56%) ? ?TODAY'S TREATMENT:  ?HEP instruction/performance c cues for techniques, handout provided.  Trial set performed of each for comprehension and symptom assessment.  See below for exercise list. ? ? ?PATIENT EDUCATION:  ?Education details: PT POC, HEP ?Person educated: Patient ?Education method: Explanation, Demonstration, Tactile cues, Verbal cues, and Handouts ?Education comprehension: verbalized understanding, returned demonstration, verbal cues required, and tactile cues required ? ? ?HOME EXERCISE PROGRAM: ?Access Code: [email or web address] ?URL: [email or web address] ?Date: 06/08/2021 ?Prepared by: Narda Amber ? ?Exercises ?- Seated Scapular Retraction  - 23 x daily - 7 x weekly - 2 sets - 10 reps - 3 seconds hold ?- Seated Gentle Upper Trapezius Stretch  - 2-3 x daily - 7 x weekly - 5 reps - 10 seconds hold ?- Gentle Levator Scapulae Stretch  - 2-3 x daily - 7 x weekly - 5 reps - 10 seconds hold ?- Supine Lower Trunk Rotation  - 2-3 x daily - 7 x weekly - 3 reps - 20 seconds hold ?- Hooklying Single Knee to Chest Stretch  - 2-3 x daily - 7 x weekly - 3 reps - 20 seconds hold ?- Seated Hamstring Stretch  - 2-3 x daily - 7 x weekly - 3 reps - 20 seconds hold ? ?ASSESSMENT: ? ?CLINICAL IMPRESSION: ?Patient is a 66 y.o. who comes to clinic with  complaints of cervical and lumbar back pain with mobility, strength and movement coordination deficits that impair their ability to perform usual daily and recreational functional activities witho

## 2021-06-11 ENCOUNTER — Ambulatory Visit (HOSPITAL_COMMUNITY): Payer: Medicare PPO

## 2021-06-11 ENCOUNTER — Encounter (HOSPITAL_COMMUNITY): Payer: Self-pay

## 2021-06-12 ENCOUNTER — Other Ambulatory Visit: Payer: Self-pay | Admitting: Internal Medicine

## 2021-06-12 DIAGNOSIS — Z1231 Encounter for screening mammogram for malignant neoplasm of breast: Secondary | ICD-10-CM

## 2021-06-15 ENCOUNTER — Ambulatory Visit: Payer: Medicare PPO

## 2021-06-16 ENCOUNTER — Encounter: Payer: Medicare PPO | Admitting: Rehabilitative and Restorative Service Providers"

## 2021-06-18 ENCOUNTER — Ambulatory Visit: Payer: Self-pay | Admitting: Family Medicine

## 2021-06-19 ENCOUNTER — Other Ambulatory Visit: Payer: Self-pay | Admitting: Endocrinology

## 2021-06-19 DIAGNOSIS — E049 Nontoxic goiter, unspecified: Secondary | ICD-10-CM

## 2021-06-23 ENCOUNTER — Encounter: Payer: Self-pay | Admitting: Physical Therapy

## 2021-06-23 ENCOUNTER — Ambulatory Visit: Payer: Medicare PPO | Admitting: Physical Therapy

## 2021-06-23 DIAGNOSIS — M5441 Lumbago with sciatica, right side: Secondary | ICD-10-CM | POA: Diagnosis not present

## 2021-06-23 DIAGNOSIS — R29898 Other symptoms and signs involving the musculoskeletal system: Secondary | ICD-10-CM | POA: Diagnosis not present

## 2021-06-23 DIAGNOSIS — G8929 Other chronic pain: Secondary | ICD-10-CM

## 2021-06-23 DIAGNOSIS — R293 Abnormal posture: Secondary | ICD-10-CM | POA: Diagnosis not present

## 2021-06-23 DIAGNOSIS — M542 Cervicalgia: Secondary | ICD-10-CM | POA: Diagnosis not present

## 2021-06-23 NOTE — Therapy (Signed)
?OUTPATIENT PHYSICAL THERAPY TREATMENT NOTE ? ? ?Patient Name: Christina Lewis ?MRN: [id number] ?DOB:June 19, 1955, 66 y.o., female ?Today's Date: 06/23/2021 ? ?PCP: Kirstie Peri, MD ?REFERRING PROVIDER: Juanda Chance, NP ? ?END OF SESSION:  ? PT End of Session - 06/23/21 1339   ? ? Visit Number 2   ? Number of Visits 8   ? Date for PT Re-Evaluation 07/24/21   ? Authorization Type [employer]   ? Authorization Time Period 8 visits requested   ? PT Start Time 1340   ? PT Stop Time 1420   ? PT Time Calculation (min) 40 min   ? Behavior During Therapy [facility] for tasks assessed/performed   ? ?  ?  ? ?  ? ? ?Past Medical History:  ?Diagnosis Date  ? Arthritis   ? Asthma 2007  ? Depression   ? Diabetes mellitus   ? Fibromyalgia   ? GERD (gastroesophageal reflux disease)   ? Gout   ? Hematuria   ? History of kidney stones   ? History of tics   ? Interstitial cystitis 2016  ? Migraines   ? Osteoarthritis   ? PCOS (polycystic ovarian syndrome) 1993  ? Pneumonia 2019  ? PONV (postoperative nausea and vomiting)   ? Sleep apnea   ? TB (pulmonary tuberculosis)   ? 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 Thyroid nodule   ? ?Past Surgical History:  ?Procedure Laterality Date  ? ABDOMINAL HYSTERECTOMY  2000  ? APPENDECTOMY  1976  ? BREAST LUMPECTOMY  1989  ? lt-negative  ? CARDIOVASCULAR STRESS TEST  2000  ? CATARACT EXTRACTION W/PHACO Left 12/07/2019  ? Procedure: CATARACT EXTRACTION PHACO AND INTRAOCULAR LENS PLACEMENT (IOC);  Surgeon: [name], MD;  Location: [facility];  Service: Ophthalmology;  Laterality: Left;  CDE: 5.55  ? CHOLECYSTECTOMY  1985  ? COLONOSCOPY    ? HYSTERECTOMY ABDOMINAL WITH SALPINGECTOMY Bilateral 2000  ? KIDNEY STONE SURGERY Right 2014  ? with stent placement in OR  ? KNEE ARTHROSCOPY Right 08/22/2013  ? Procedure: ARTHROSCOPY RIGHT KNEE FOR INFECTION LAVAGE AND DRAINAGE;  Surgeon: Thera Flake., MD;  Location: [facility];  Service: Orthopedics;  Laterality: Right;  ? KNEE  ARTHROSCOPY WITH PATELLA RECONSTRUCTION Right 08/06/2013  ? Procedure: RIGHT KNEE ARTHROSCOPY WITH MENISCECTOMY MEDIAL, ARTHROSCOPY KNEE WITH DEBRIDEMENT/SHAVING (CHONDROPLASTY) ;  Surgeon: Thera Flake., MD;  Location: [facility];  Service: Orthopedics;  Laterality: Right;  ? LUMBAR LAMINECTOMY/DECOMPRESSION MICRODISCECTOMY Right 09/11/2019  ? Procedure: Right Lumbar Three-Four Microdiscectomy;  Surgeon: Maeola Harman, MD;  Location: [facility];  Service: Neurosurgery;  Laterality: Right;  Right Lumbar Three-Four Microdiscectomy  ? NASAL SINUS SURGERY    ? x4  ? OOPHORECTOMY    ? SHOULDER SURGERY  2011  ? right shoulder  ? THYROID SURGERY  1994  ? TONSILLECTOMY Bilateral 1974  ? ?Patient Active Problem List  ? Diagnosis Date Noted  ? Cellulitis 03/04/2021  ? Rheumatoid arthritis (HCC) 03/04/2021  ? Diabetes mellitus type 2 in obese (HCC) 03/04/2021  ? Right foot infection 03/04/2021  ? 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 myelopathy 09/11/2019  ? Leukocytosis 11/04/2016  ? Fibromyalgia 02/10/2016  ? Other fatigue 02/10/2016  ? Primary osteoarthritis of both hands 02/10/2016  ? History of migraine 02/10/2016  ? History of thyroid nodule 02/10/2016  ? History of sleep apnea 02/10/2016  ? History of renal calculi 02/10/2016  ? Pain in joint, shoulder region 10/02/2010  ? Adhesive  capsulitis of shoulder 10/02/2010  ? Muscle weakness (generalized) 10/02/2010  ? ? ?REFERRING DIAG: M54.2 (ICD-10-CM) - Cervicalgia ?M54.2 (ICD-10-CM) - Bilateral neck pain ?M79.18 (ICD-10-CM) - Myofascial pain syndrome ?M79.7 (ICD-10-CM) - Fibromyalgia ? ?THERAPY DIAG:  ?Neck pain ? ?Abnormal posture ? ?Other symptoms and signs involving the musculoskeletal system ? ?Chronic right-sided low back pain with right-sided sciatica ? ?PERTINENT HISTORY: asthma, arthritis, fibromyalgia, OA, gout, GERD, sleep apnea, migraines, Rt knee arthroscopy with patella reconstruction ? ?PRECAUTIONS: none ? ?SUBJECTIVE: Pt stating pain today in her neck is  4/10, but reports last night it was 20/10.  ? ?PAIN:  ?Are you having pain? Yes: NPRS scale: 4/10 ?Pain location: neck ?Pain description: burning, stabbing ?Aggravating factors: prolonged sitting, reading, picking up objects ?Relieving factors: ice and heat, lying down ? ? ?OBJECTIVE: (objective measures completed at initial evaluation unless otherwise dated) ? ?DIAGNOSTIC FINDINGS:  ?Mild cervical spondylosis. Mild left foraminal narrowing C3-4 and ?C5-6. ?  ?PATIENT SURVEYS:  ?06/08/2021: FOTO 47% (predicted 56%) ?  ?  ?COGNITION: ?Overall cognitive status: Within functional limits for tasks assessed ?  ?  ?SENSATION: ?WFL ?  ?POSTURE:  ?Forward head and rounded shoulders, elevated shoulders with lateral cervical side bending to the left ?  ?PALPATION: ?TTP: active trigger points in left upper traps and tender            ?  ?CERVICAL ROM:  ?  ?Active ROM ?degrees AROM  ?06/08/21 AROM ?06/23/21  ?Flexion 12   ?Extension 20   ?Right lateral flexion 20   ?Left lateral flexion 26   ?Right rotation 58   ?Left rotation 55   ? (Blank rows = not tested) ?  ?UE ROM: ?  ?Active ROM Right ?06/08/2021 Left ?06/08/2021  ?[facility] flexion [facility]  ?Should[facility]  ?[facility]  ?Shoulder adduction      ?Shoulder extension      ?Shoulder internal rotation      ?Shoulder external rotation      ? (Blank rows = not tested) ?  ?UE MMT: ?  ?MMT Right ?06/08/2021 Left ?06/08/2021  ?Shoulder flexion 4/5 4/5  ?Shoulder extension 4/5 4/5  ?Shoulder abduction 4/5 4/5  ?Shoulder adduction      ?Shoulder extension      ?Shoulder internal rotation 4/5 4/5  ?Shoulder external rotation 4/5 4/5  ?Middle trapezius      ?Lower trapezius      ?Elbow flexion      ?Elbow extension      ?Wrist flexion      ?Wrist extension      ?Wrist ulnar deviation      ?Wrist radial deviation      ?Wrist pronation      ?Wrist supination      ?Grip strength      ? (Blank rows = not tested) ?  ?FUNCTIONAL TESTS:  ?5 times sit to stand: 20 seconds  with UE support ?  ?PATIENT SURVEYS:  ?FOTO 47% on 06/08/2021 (predicted 56%) ?  ?TODAY'S TREATMENT:  ?06/23/2021: ?TherEx: ?Upper trap stretch x 2 each side holding 20 seconds ?Levator stretch x 2 each side holding 20 seconds ?Cervical retraction x 10 holding 10 seconds ?Supine SKTC x 2 holding 20 seconds each LE ?Bridges 2 x 10 holding 5 seconds ?Rows: Level 3 band x 15 holding 3 seconds ?Shoulder extension Level 2 band x 15 ?Manual: ?STM and skilled palpation performed during TPDN ? ?Trigger Point Dry-Needling  ?Treatment instructions: Expect mild to moderate  muscle soreness. S/S of pneumothorax if dry needled over a lung field, and to seek immediate medical attention should they occur. Patient verbalized understanding of these instructions and education. ? ?Patient Consent Given: Yes ?Education handout provided: Yes ?Muscles treated: bilateral upper trap and infraspinatus ?Electrical stimulation performed: No ?Parameters: N/A ?Treatment response/outcome: multiple twitch responses noted in infraspinatus and upper trap bilaterally ? ?Modalities: ?Moist heat x 5 minutes  ? ? ? ? ?06/08/2021: ?HEP instruction/performance c cues for techniques, handout provided.  Trial set performed of each for comprehension and symptom assessment.  See below for exercise list. ?  ?  ?PATIENT EDUCATION:  ?Education details: PT POC, HEP ?Person educated: Patient ?Education method: Explanation, Demonstration, Tactile cues, Verbal cues, and Handouts ?Education comprehension: verbalized understanding, returned demonstration, verbal cues required, and tactile cues required ?  ?  ?HOME EXERCISE PROGRAM: ?Access Code: [email or web address] ?URL: [email or web address] ?Date: 06/08/2021 ?Prepared by: Narda Amber ?  ?Exercises ?- Seated Scapular Retraction  - 23 x daily - 7 x weekly - 2 sets - 10 reps - 3 seconds hold ?- Seated Gentle Upper Trapezius Stretch  - 2-3 x daily - 7 x weekly - 5 reps - 10 seconds hold ?- Gentle Levator Scapulae  Stretch  - 2-3 x daily - 7 x weekly - 5 reps - 10 seconds hold ?- Supine Lower Trunk Rotation  - 2-3 x daily - 7 x weekly - 3 reps - 20 seconds hold ?- Hooklying Single Knee to Chest Stretch  - 2-3 x dai

## 2021-06-24 ENCOUNTER — Other Ambulatory Visit: Payer: Medicare PPO

## 2021-06-24 ENCOUNTER — Other Ambulatory Visit: Payer: Self-pay | Admitting: Internal Medicine

## 2021-06-24 DIAGNOSIS — Z1231 Encounter for screening mammogram for malignant neoplasm of breast: Secondary | ICD-10-CM

## 2021-06-30 ENCOUNTER — Encounter: Payer: Medicare PPO | Admitting: Physical Therapy

## 2021-07-01 ENCOUNTER — Ambulatory Visit
Admission: RE | Admit: 2021-07-01 | Discharge: 2021-07-01 | Disposition: A | Discharge status: Home / Self Care | Payer: Medicare PPO | Source: Ambulatory Visit

## 2021-07-01 ENCOUNTER — Ambulatory Visit: Payer: Medicare PPO | Admitting: Physical Therapy

## 2021-07-01 ENCOUNTER — Ambulatory Visit
Admission: RE | Admit: 2021-07-01 | Discharge: 2021-07-01 | Disposition: A | Discharge status: Home / Self Care | Payer: Medicare PPO | Source: Ambulatory Visit | Attending: Endocrinology | Admitting: Endocrinology

## 2021-07-01 ENCOUNTER — Encounter: Payer: Self-pay | Admitting: Physical Therapy

## 2021-07-01 DIAGNOSIS — Z1231 Encounter for screening mammogram for malignant neoplasm of breast: Secondary | ICD-10-CM

## 2021-07-01 DIAGNOSIS — M5441 Lumbago with sciatica, right side: Secondary | ICD-10-CM | POA: Diagnosis not present

## 2021-07-01 DIAGNOSIS — R29898 Other symptoms and signs involving the musculoskeletal system: Secondary | ICD-10-CM

## 2021-07-01 DIAGNOSIS — G8929 Other chronic pain: Secondary | ICD-10-CM | POA: Diagnosis not present

## 2021-07-01 DIAGNOSIS — M542 Cervicalgia: Secondary | ICD-10-CM

## 2021-07-01 DIAGNOSIS — R293 Abnormal posture: Secondary | ICD-10-CM | POA: Diagnosis not present

## 2021-07-01 DIAGNOSIS — E049 Nontoxic goiter, unspecified: Secondary | ICD-10-CM | POA: Diagnosis not present

## 2021-07-01 NOTE — Therapy (Signed)
?OUTPATIENT PHYSICAL THERAPY TREATMENT NOTE ? ? ?Patient Name: Christina Lewis ?MRN: [id number] ?DOB:[birth year], 66 y.o., female ?Today's Date: 07/01/2021 ? ?PCP: Kirstie PeriShah, Ashish, MD ?REFERRING PROVIDER: Juanda ChanceWilliams, [name] E, NP ? ?END OF SESSION:  ? PT End of Session - 07/01/21 0935   ? ? Visit Number 3   ? Number of Visits 8   ? Date for PT Re-Evaluation 07/24/21   ? Authorization Type [employer]   ? Authorization Time Period 8 visits requested   ? PT Start Time 0930   ? PT Stop Time 1015   ? PT Time Calculation (min) 45 min   ? Activity Tolerance Patient tolerated treatment well   ? Behavior During Therapy [facility] for tasks assessed/performed   ? ?  ?  ? ?  ? ? ? ?Past Medical History:  ?Diagnosis Date  ? Arthritis   ? Asthma 2007  ? Depression   ? Diabetes mellitus   ? Fibromyalgia   ? GERD (gastroesophageal reflux disease)   ? Gout   ? Hematuria   ? History of kidney stones   ? History of tics   ? Interstitial cystitis 2016  ? Migraines   ? Osteoarthritis   ? PCOS (polycystic ovarian syndrome) 1993  ? Pneumonia 2019  ? PONV (postoperative nausea and vomiting)   ? Sleep apnea   ? TB (pulmonary tuberculosis)   ? 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 Thyroid nodule   ? ?Past Surgical History:  ?Procedure Laterality Date  ? ABDOMINAL HYSTERECTOMY  2000  ? APPENDECTOMY  1976  ? BREAST LUMPECTOMY  1989  ? lt-negative  ? CARDIOVASCULAR STRESS TEST  2000  ? CATARACT EXTRACTION W/PHACO Left 12/07/2019  ? Procedure: CATARACT EXTRACTION PHACO AND INTRAOCULAR LENS PLACEMENT (IOC);  Surgeon: Fabio PierceWrzosek, James, MD;  Location: [facility];  Service: Ophthalmology;  Laterality: Left;  CDE: 5.55  ? CHOLECYSTECTOMY  1985  ? COLONOSCOPY    ? HYSTERECTOMY ABDOMINAL WITH SALPINGECTOMY Bilateral 2000  ? KIDNEY STONE SURGERY Right 2014  ? with stent placement in OR  ? KNEE ARTHROSCOPY Right 08/22/2013  ? Procedure: ARTHROSCOPY RIGHT KNEE FOR INFECTION LAVAGE AND DRAINAGE;  Surgeon: Thera FlakeW D Caffrey Jr., MD;  Location: [facility];  Service: Orthopedics;  Laterality: Right;  ? KNEE ARTHROSCOPY WITH PATELLA RECONSTRUCTION Right 08/06/2013  ? Procedure: RIGHT KNEE ARTHROSCOPY WITH MENISCECTOMY MEDIAL, ARTHROSCOPY KNEE WITH DEBRIDEMENT/SHAVING (CHONDROPLASTY) ;  Surgeon: Thera FlakeW D Caffrey Jr., MD;  Location: [facility];  Service: Orthopedics;  Laterality: Right;  ? LUMBAR LAMINECTOMY/DECOMPRESSION MICRODISCECTOMY Right 09/11/2019  ? Procedure: Right Lumbar Three-Four Microdiscectomy;  Surgeon: Maeola HarmanStern, Joseph, MD;  [facility];  Service: Neurosurgery;  Laterality: Right;  Right Lumbar Three-Four Microdiscectomy  ? NASAL SINUS SURGERY    ? x4  ? OOPHORECTOMY    ? SHOULDER SURGERY  2011  ? right shoulder  ? THYROID SURGERY  1994  ? TONSILLECTOMY Bilateral 1974  ? ?Patient Active Problem List  ? Diagnosis Date Noted  ? Cellulitis 03/04/2021  ? Rheumatoid arthritis (HCC) 03/04/2021  ? Diabetes mellitus type 2 in obese (HCC) 03/04/2021  ? Right foot infection 03/04/2021  ? 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 myelopathy 09/11/2019  ? Leukocytosis 11/04/2016  ? Fibromyalgia 02/10/2016  ? Other fatigue 02/10/2016  ? Primary osteoarthritis of both hands 02/10/2016  ? History of migraine 02/10/2016  ? History of thyroid nodule 02/10/2016  ? History of sleep apnea 02/10/2016  ? History of renal calculi 02/10/2016  ?  Pain in joint, shoulder region 10/02/2010  ? Adhesive capsulitis of shoulder 10/02/2010  ? Muscle weakness (generalized) 10/02/2010  ? ? ?REFERRING DIAG: M54.2 (ICD-10-CM) - Cervicalgia ?M54.2 (ICD-10-CM) - Bilateral neck pain ?M79.18 (ICD-10-CM) - Myofascial pain syndrome ?M79.7 (ICD-10-CM) - Fibromyalgia ? ?THERAPY DIAG:  ?Neck pain ? ?Abnormal posture ? ?Chronic right-sided low back pain with right-sided sciatica ? ?Other symptoms and signs involving the musculoskeletal system ? ?PERTINENT HISTORY: asthma, arthritis, fibromyalgia, OA, gout, GERD, sleep apnea, migraines, Rt knee arthroscopy with patella  reconstruction ? ?PRECAUTIONS: none ? ?SUBJECTIVE: Pt stating pain today in her neck is 3-4/10  ? ?PAIN:  ?Are you having pain? Yes: NPRS scale: 3-4/10 ?Pain location: neck ?Pain description: burning, stabbing ?Aggravating factors: prolonged sitting, reading, picking up objects ?Relieving factors: ice and heat, lying down ? ? ?OBJECTIVE: (objective measures completed at initial evaluation unless otherwise dated) ? ?DIAGNOSTIC FINDINGS:  ?Mild cervical spondylosis. Mild left foraminal narrowing C3-4 and ?C5-6. ?  ?PATIENT SURVEYS:  ?06/08/2021: FOTO 47% (predicted 56%) ?  ?  ?COGNITION: ?Overall cognitive status: Within functional limits for tasks assessed ?  ?  ?SENSATION: ?WFL ?  ?POSTURE:  ?Forward head and rounded shoulders, elevated shoulders with lateral cervical side bending to the left ?  ?PALPATION: ?TTP: active trigger points in left upper traps and tender            ?  ?CERVICAL ROM:  ?  ?Active ROM ?degrees AROM  ?06/08/21 AROM ?07/01/21  ?Flexion 12 14  ?Extension 20 22  ?Right lateral flexion 20 20  ?Left lateral flexion 26 28  ?Right rotation 58 60  ?Left rotation 55 55  ? (Blank rows = not tested) ?  ?UE ROM: ?  ?Active ROM Right ?06/08/2021 Left ?06/08/2021  ?Shoulder flexion [facility] WFL  ?Shoulder extension [facility] WFL  ?Shoulder abduction [facility] WFL  ?Shoulder adduction      ?Shoulder extension      ?Shoulder internal rotation      ?Shoulder external rotation      ? (Blank rows = not tested) ?  ?UE MMT: ?  ?MMT Right ?06/08/2021 Left ?06/08/2021  ?Shoulder flexion 4/5 4/5  ?Shoulder extension 4/5 4/5  ?Shoulder abduction 4/5 4/5  ?Shoulder adduction      ?Shoulder extension      ?Shoulder internal rotation 4/5 4/5  ?Shoulder external rotation 4/5 4/5  ?Middle trapezius      ?Lower trapezius      ?Elbow flexion      ?Elbow extension      ?Wrist flexion      ?Wrist extension      ?Wrist ulnar deviation      ?Wrist radial deviation      ?Wrist pronation      ?Wrist supination      ?Grip strength      ? (Blank rows =  not tested) ?  ?FUNCTIONAL TESTS:  ?5 times sit to stand: 20 seconds with UE support ?  ?PATIENT SURVEYS:  ?FOTO 47% on 06/08/2021 (predicted 56%) ?  ?TODAY'S TREATMENT:  ?07/01/2021:  ?TherEx: ?Door stretch x 2 holding 20 seconds shoulders at 90 degrees ?Rows: Level 3 band x 15 holding 3 seconds ?Shoulder extension Level 2 band x 15 ?Wall angles x 5 c cues for posture and technique ?Manual: ?STM and skilled palpation performed during TPDN ?STM to rhomboids, infraspinatus, upper traps, levator ? ?Trigger Point Dry-Needling  ?Treatment instructions: Expect mild to moderate muscle soreness. S/S of pneumothorax if dry needled over a lung  field, and to seek immediate medical attention should they occur. Patient verbalized understanding of these instructions and education. ? ?Patient Consent Given: Yes ?Education handout provided: Yes ?Muscles treated: left upper trap and infraspinatus, bilateral cervical paraspinals ?Electrical stimulation performed: No ?Parameters: N/A ?Treatment response/outcome: multiple twitch responses noted in all muscles needled ? ?Modalities: ?Moist heat x 5 minutes  ? ? ?06/23/2021: ?TherEx: ?Upper trap stretch x 2 each side holding 20 seconds ?Levator stretch x 2 each side holding 20 seconds ?Cervical retraction x 10 holding 10 seconds ?Supine SKTC x 2 holding 20 seconds each LE ?Bridges 2 x 10 holding 5 seconds ?Rows: Level 3 band x 15 holding 3 seconds ?Shoulder extension Level 2 band x 15 ?Manual: ?STM and skilled palpation performed during TPDN ? ?Trigger Point Dry-Needling  ?Treatment instructions: Expect mild to moderate muscle soreness. S/S of pneumothorax if dry needled over a lung field, and to seek immediate medical attention should they occur. Patient verbalized understanding of these instructions and education. ? ?Patient Consent Given: Yes ?Education handout provided: Yes ?Muscles treated: bilateral upper trap and infraspinatus ?Electrical stimulation performed: No ?Parameters:  N/A ?Treatment response/outcome: multiple twitch responses noted in infraspinatus and upper trap bilaterally ? ?Modalities: ?Moist heat x 5 minutes  ? ? ? ? ?06/08/2021: ?HEP instruction/performance c cues for techniques, handout provided

## 2021-07-05 ENCOUNTER — Other Ambulatory Visit: Payer: Self-pay | Admitting: Rheumatology

## 2021-07-06 ENCOUNTER — Other Ambulatory Visit: Payer: Self-pay | Admitting: Rheumatology

## 2021-07-06 MED ORDER — BACLOFEN 10 MG PO TABS: ORAL_TABLET | ORAL | 0 refills | Status: DC

## 2021-07-06 MED ORDER — TIZANIDINE HCL 4 MG PO TABS: 4.0000 mg | ORAL_TABLET | Freq: Every evening | ORAL | 0 refills | Status: DC | PRN

## 2021-07-06 NOTE — Telephone Encounter (Signed)
Next Visit: 09/03/2021 ?  ?Last Visit: 04/29/2021 ?  ?Last Fill: 05/06/2021 ?  ?Dx: Fibromyalgia ?  ?Current Dose per office note on 04/29/2021: not dicussed ?  ?Okay to refill Tizanidine?   ?

## 2021-07-06 NOTE — Telephone Encounter (Signed)
Next Visit: 09/03/2021 ? ?Last Visit: 04/29/2021 ? ?Last Fill: 06/05/2021 ? ?Dx: Fibromyalgia ? ?Current Dose per office note on 04/29/2021: not dicussed ? ?Okay to refill Baclofen?   ?

## 2021-07-07 ENCOUNTER — Encounter: Payer: Medicare PPO | Admitting: Physical Therapy

## 2021-07-14 ENCOUNTER — Encounter: Payer: Self-pay | Admitting: Physical Therapy

## 2021-07-14 ENCOUNTER — Ambulatory Visit: Payer: Medicare PPO | Admitting: Physical Therapy

## 2021-07-14 DIAGNOSIS — M5441 Lumbago with sciatica, right side: Secondary | ICD-10-CM | POA: Diagnosis not present

## 2021-07-14 DIAGNOSIS — M542 Cervicalgia: Secondary | ICD-10-CM

## 2021-07-14 DIAGNOSIS — G8929 Other chronic pain: Secondary | ICD-10-CM

## 2021-07-14 DIAGNOSIS — R293 Abnormal posture: Secondary | ICD-10-CM | POA: Diagnosis not present

## 2021-07-14 DIAGNOSIS — R29898 Other symptoms and signs involving the musculoskeletal system: Secondary | ICD-10-CM | POA: Diagnosis not present

## 2021-07-14 NOTE — Therapy (Addendum)
OUTPATIENT PHYSICAL THERAPY TREATMENT NOTE Discharge    Patient Name: Christina Lewis MRN: [id number] DOB:[birth year], 66 y.o., female Today's Date: 07/14/2021  PCP: Monico Blitz, MD REFERRING PROVIDER: Lorine Bears, NP  END OF SESSION:   PT End of Session - 07/14/21 1430     Visit Number 4    Number of Visits 8    Date for PT Re-Evaluation 07/24/21    Authorization Type [employer]    Authorization Time Period 8 visits requested    PT Start Time 1346    PT Stop Time 1430    PT Time Calculation (min) 44 min    Activity Tolerance Patient tolerated treatment well    Behavior During Therapy [facility] for tasks assessed/performed               Past Medical History:  Diagnosis Date   Arthritis    Asthma 2007   Depression    Diabetes mellitus    Fibromyalgia    GERD (gastroesophageal reflux disease)    Gout    Hematuria    History of kidney stones    History of tics    Interstitial cystitis 2016   Migraines    Osteoarthritis    PCOS (polycystic ovarian syndrome) 1993   Pneumonia 2019   PONV (postoperative nausea and vomiting)    Sleep apnea    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, took medication for a year   Thyroid nodule    Past Surgical History:  Procedure Laterality Date   ABDOMINAL HYSTERECTOMY  2000   APPENDECTOMY  1976   BREAST LUMPECTOMY  1989   lt-negative   CARDIOVASCULAR STRESS TEST  2000   CATARACT EXTRACTION W/PHACO Left 12/07/2019   Procedure: CATARACT EXTRACTION PHACO AND INTRAOCULAR LENS PLACEMENT ([place]);  Surgeon: Baruch Goldmann, MD;  Location: [facility];  Service: Ophthalmology;  Laterality: Left;  CDE: 5.55   CHOLECYSTECTOMY  1985   COLONOSCOPY     HYSTERECTOMY ABDOMINAL WITH SALPINGECTOMY Bilateral 2000   KIDNEY STONE SURGERY Right 2014   with stent placement in OR   KNEE ARTHROSCOPY Right 08/22/2013   Procedure: ARTHROSCOPY RIGHT KNEE FOR INFECTION LAVAGE AND DRAINAGE;  Surgeon: Yvette Rack., MD;  Location: [place];  Service: Orthopedics;  Laterality: Right;   KNEE ARTHROSCOPY WITH PATELLA RECONSTRUCTION Right 08/06/2013   Procedure: RIGHT KNEE ARTHROSCOPY WITH MENISCECTOMY MEDIAL, ARTHROSCOPY KNEE WITH DEBRIDEMENT/SHAVING (CHONDROPLASTY) ;  Surgeon: Yvette Rack., MD;  Location: [place];  Service: Orthopedics;  Laterality: Right;   LUMBAR LAMINECTOMY/DECOMPRESSION MICRODISCECTOMY Right 09/11/2019   Procedure: Right Lumbar Three-Four Microdiscectomy;  Surgeon: Erline Levine, MD;  Location: [place];  Service: Neurosurgery;  Laterality: Right;  Right Lumbar Three-Four Microdiscectomy   NASAL SINUS SURGERY     x4   OOPHORECTOMY     SHOULDER SURGERY  2011   right shoulder   THYROID SURGERY  1994   TONSILLECTOMY Bilateral 1974   Patient Active Problem List   Diagnosis Date Noted   Cellulitis 03/04/2021   Rheumatoid arthritis ([place]) 03/04/2021   Diabetes mellitus type 2 in obese ([place]) 03/04/2021   Right foot infection 03/04/2021   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 myelopathy 09/11/2019   Leukocytosis 11/04/2016   Fibromyalgia 02/10/2016   Other fatigue 02/10/2016   Primary osteoarthritis of both hands 02/10/2016   History of migraine 02/10/2016   History of thyroid nodule 02/10/2016   History of sleep apnea 02/10/2016   History of renal  calculi 02/10/2016   Pain in joint, shoulder region 10/02/2010   Adhesive capsulitis of shoulder 10/02/2010   Muscle weakness (generalized) 10/02/2010    REFERRING DIAG: M54.2 (ICD-10-CM) - Cervicalgia M54.2 (ICD-10-CM) - Bilateral neck pain M79.18 (ICD-10-CM) - Myofascial pain syndrome M79.7 (ICD-10-CM) - Fibromyalgia  THERAPY DIAG:  Neck pain  Abnormal posture  Chronic right-sided low back pain with right-sided sciatica  Other symptoms and signs involving the musculoskeletal system  PERTINENT HISTORY: asthma, arthritis, fibromyalgia, OA, gout, GERD, sleep apnea, migraines, Rt knee arthroscopy with patella  reconstruction  PRECAUTIONS: none  SUBJECTIVE: Pt arriving today reporting no neck pain and feels dry needling helped. Pt only reporting mild headache with 4/10 pain. Pt stating her headache was worse last night and sleeping helped.   PAIN:  Are you having pain? Yes: NPRS scale: 0/10 Pain location: neck Pain description: burning, stabbing Aggravating factors: prolonged sitting, reading, picking up objects Relieving factors: ice and heat, lying down   OBJECTIVE: (objective measures completed at initial evaluation unless otherwise dated)  DIAGNOSTIC FINDINGS:  Mild cervical spondylosis. Mild left foraminal narrowing C3-4 and C5-6.   PATIENT SURVEYS:  06/08/2021: FOTO 47% (predicted 56%)     COGNITION: Overall cognitive status: Within functional limits for tasks assessed     SENSATION: WFL   POSTURE:  Forward head and rounded shoulders, elevated shoulders with lateral cervical side bending to the left   PALPATION: TTP: active trigger points in left upper traps and tender              CERVICAL ROM:    Active ROM degrees AROM  06/08/21 AROM 07/01/21 AROM 07/14/21  Flexion _0 Extension 20 22 35  Right lateral flexion _1 pulling/pain noted on left upper trap  Left lateral flexion 26 28 34  Right rotation 58 60 70  Left rotation 55 55 68   (Blank rows = not tested)   UE ROM:   Active ROM Right 06/08/2021 Left 06/08/2021  Shoulder flexion [facility]  Shoulder extension [facility]  Shoulder abduction [facility]  Shoulder adduction      Shoulder extension      Shoulder internal rotation      Shoulder external rotation       (Blank rows = not tested)   UE MMT:   MMT Right 06/08/2021 Left 06/08/2021  Shoulder flexion 4/5 4/5  Shoulder extension 4/5 4/5  Shoulder abduction 4/5 4/5  Shoulder adduction      Shoulder extension      Shoulder internal rotation 4/5 4/5  Shoulder external rotation 4/5 4/5  Middle trapezius      Lower trapezius      Elbow flexion       Elbow extension      Wrist flexion      Wrist extension      Wrist ulnar deviation      Wrist radial deviation      Wrist pronation      Wrist supination      Grip strength       (Blank rows = not tested)   FUNCTIONAL TESTS:   06/08/2021:5 times sit to stand: 20 seconds with UE support   PATIENT SURVEYS:  FOTO 47% on 06/08/2021 (predicted 56%) FOTO: 63 % on 07/14/2021    TODAY'S TREATMENT:  07/14/2021: TherEx: Door stretch x 2 holding 20 seconds shoulders at 90 degrees Rows: Level 3 band x 15 holding 3 seconds Shoulder extension Level 2 band x 15 D1  PNF using Level 1 band x 10 each UE following with head turns Manual: STM and skilled palpation performed during TPDN STM suboccipitals and splenius capitis  Trigger Point Dry-Needling  Treatment instructions: Expect mild to moderate muscle soreness. S/S of pneumothorax if dry needled over a lung field, and to seek immediate medical attention should they occur. Patient verbalized understanding of these instructions and education.  Patient Consent Given: Yes Education handout provided: Yes Muscles treated: bilateral suboccipitals, splenius capitis Electrical stimulation performed: No Parameters: N/A Treatment response/outcome: multiple twitch responses noted in all muscles needled  Modalities: Moist heat x 5 minutes     07/01/2021:  TherEx: Door stretch x 2 holding 20 seconds shoulders at 90 degrees Rows: Level 3 band x 15 holding 3 seconds Shoulder extension Level 2 band x 15 Wall angles x 5 c cues for posture and technique Manual: STM and skilled palpation performed during TPDN STM to rhomboids, infraspinatus, upper traps, levator  Trigger Point Dry-Needling  Treatment instructions: Expect mild to moderate muscle soreness. S/S of pneumothorax if dry needled over a lung field, and to seek immediate medical attention should they occur. Patient verbalized understanding of these instructions and education.  Patient  Consent Given: Yes Education handout provided: Yes Muscles treated: left upper trap and infraspinatus, bilateral cervical paraspinals Electrical stimulation performed: No Parameters: N/A Treatment response/outcome: multiple twitch responses noted in all muscles needled  Modalities: Moist heat x 5 minutes    06/23/2021: TherEx: Upper trap stretch x 2 each side holding 20 seconds Levator stretch x 2 each side holding 20 seconds Cervical retraction x 10 holding 10 seconds Supine SKTC x 2 holding 20 seconds each LE Bridges 2 x 10 holding 5 seconds Rows: Level 3 band x 15 holding 3 seconds Shoulder extension Level 2 band x 15 Manual: STM and skilled palpation performed during TPDN  Trigger Point Dry-Needling  Treatment instructions: Expect mild to moderate muscle soreness. S/S of pneumothorax if dry needled over a lung field, and to seek immediate medical attention should they occur. Patient verbalized understanding of these instructions and education.  Patient Consent Given: Yes Education handout provided: Yes Muscles treated: bilateral upper trap and infraspinatus Electrical stimulation performed: No Parameters: N/A Treatment response/outcome: multiple twitch responses noted in infraspinatus and upper trap bilaterally  Modalities: Moist heat x 5 minutes      PATIENT EDUCATION:  Education details: PT POC, HEP Person educated: Patient Education method: Consulting civil engineer, Demonstration, Tactile cues, Verbal cues, and Handouts Education comprehension: verbalized understanding, returned demonstration, verbal cues required, and tactile cues required     HOME EXERCISE PROGRAM: Access Code: [email or web address] URL: [email or web address] Date: 06/08/2021 Prepared by: Kearney Hard   Exercises - Seated Scapular Retraction  - 23 x daily - 7 x weekly - 2 sets - 10 reps - 3 seconds hold - Seated Gentle Upper Trapezius Stretch  - 2-3 x daily - 7 x weekly - 5 reps - 10 seconds  hold - Gentle Levator Scapulae Stretch  - 2-3 x daily - 7 x weekly - 5 reps - 10 seconds hold - Supine Lower Trunk Rotation  - 2-3 x daily - 7 x weekly - 3 reps - 20 seconds hold - Hooklying Single Knee to Chest Stretch  - 2-3 x daily - 7 x weekly - 3 reps - 20 seconds hold - Seated Hamstring Stretch  - 2-3 x daily - 7 x weekly - 3 reps - 20 seconds hold   ASSESSMENT:   CLINICAL IMPRESSION: Pt arriving to therapy  reporting no pain in her neck. Pt reporting the needling really helped. Pt reproting today mild headache pain that has been ongoing over the weekend. Pt wishing to try needling to help. TPDN performed with twitch response noted in bilateral suboccipitals and splenius capitis muscles.  Pt has improved her cervical AROM in all directions and her FOTO score has increased to 61% since her initial evaluation. Continue skilled PT to maximize pt's function.      OBJECTIVE IMPAIRMENTS decreased activity tolerance, decreased balance, decreased mobility, difficulty walking, decreased ROM, decreased strength, impaired flexibility, impaired tone, postural dysfunction, and pain.    ACTIVITY LIMITATIONS cleaning and community activity.    PERSONAL FACTORS asthma, arthritis, fibromyalgia, OA, gout, GERD, sleep apnea, migraines, Rt knee arthroscopy with patella reconstruction are also affecting patient's functional outcome.      REHAB POTENTIAL: Good   CLINICAL DECISION MAKING: Stable/uncomplicated   EVALUATION COMPLEXITY: Moderate     GOALS: Goals reviewed with patient? Yes Short term PT Goals (target date for Short term goals are 3 weeks 06/26/2021) Patient will demonstrate independent use of home exercise program to maintain progress from in clinic treatments. Goal status: MET 5/2//2023   Long term PT goals (target dates for all long term goals are 8 weeks 08/07/2021   ) Patient will demonstrate/report pain at worst less than or equal to 2/10 to facilitate minimal limitation in daily  activity secondary to pain symptoms. Goal status: On-going 07/01/2021   Patient will demonstrate independent use of home exercise program to facilitate ability to maintain/progress functional gains from skilled physical therapy services. Goal status: New   Patient will demonstrate FOTO outcome > or = 56 % to indicate reduced disability due to condition. Goal status: New   Pt will be able to improve her bilateral cervical rotation to >/=  60 degrees with pain </= 2/10.  Goal status: New       5.   Pt will be able to lift 10# from floor to counter height with pain </= 2/10.  Goal status: New       PLAN: PT FREQUENCY: 1x/week   PT DURATION: 8 weeks   PLANNED INTERVENTIONS:  Therapeutic exercises, Therapeutic activity, Neuro Muscular re-education, Balance training, Gait training, Patient/Family education, Joint mobilization, Stair training, DME instructions, Dry Needling, Electrical stimulation, Cryotherapy, Moist heat, Taping, Ultrasound, Ionotophoresis 15m/ml Dexamethasone, and Manual therapy.  All included unless contraindicated   PLAN FOR NEXT SESSION: Assess response to DN,  cervical mobs,cervical ROM, shoulder strengthening.         JOretha Caprice PT, MPT 07/14/2021, 2:36 PM   PHYSICAL THERAPY DISCHARGE SUMMARY  Visits from Start of Care: 4  Current functional level related to goals / functional outcomes: See above   Remaining deficits: See above   Education / Equipment: HEP   Patient agrees to discharge. Patient goals were not met. Patient is being discharged due to not returning since the last visit.

## 2021-07-21 ENCOUNTER — Encounter: Payer: Medicare PPO | Admitting: Physical Therapy

## 2021-07-24 DIAGNOSIS — E559 Vitamin D deficiency, unspecified: Secondary | ICD-10-CM | POA: Diagnosis not present

## 2021-07-24 DIAGNOSIS — E282 Polycystic ovarian syndrome: Secondary | ICD-10-CM | POA: Diagnosis not present

## 2021-07-24 DIAGNOSIS — E1165 Type 2 diabetes mellitus with hyperglycemia: Secondary | ICD-10-CM | POA: Diagnosis not present

## 2021-07-24 DIAGNOSIS — M069 Rheumatoid arthritis, unspecified: Secondary | ICD-10-CM | POA: Diagnosis not present

## 2021-07-24 DIAGNOSIS — E049 Nontoxic goiter, unspecified: Secondary | ICD-10-CM | POA: Diagnosis not present

## 2021-07-24 DIAGNOSIS — E7800 Pure hypercholesterolemia, unspecified: Secondary | ICD-10-CM | POA: Diagnosis not present

## 2021-07-24 DIAGNOSIS — R1310 Dysphagia, unspecified: Secondary | ICD-10-CM | POA: Diagnosis not present

## 2021-07-28 ENCOUNTER — Other Ambulatory Visit: Payer: Self-pay | Admitting: Endocrinology

## 2021-07-28 ENCOUNTER — Other Ambulatory Visit (HOSPITAL_COMMUNITY): Payer: Self-pay | Admitting: Endocrinology

## 2021-07-28 ENCOUNTER — Ambulatory Visit: Payer: Medicare PPO | Admitting: Podiatry

## 2021-07-28 DIAGNOSIS — M5416 Radiculopathy, lumbar region: Secondary | ICD-10-CM | POA: Diagnosis not present

## 2021-07-28 DIAGNOSIS — R1310 Dysphagia, unspecified: Secondary | ICD-10-CM

## 2021-08-03 ENCOUNTER — Encounter: Payer: Medicare PPO | Admitting: Physical Therapy

## 2021-08-03 ENCOUNTER — Ambulatory Visit: Payer: Medicare PPO | Admitting: Podiatry

## 2021-08-05 ENCOUNTER — Ambulatory Visit (HOSPITAL_COMMUNITY)
Admission: RE | Admit: 2021-08-05 | Discharge: 2021-08-05 | Disposition: A | Discharge status: Home / Self Care | Payer: Medicare PPO | Source: Ambulatory Visit | Attending: Endocrinology | Admitting: Endocrinology

## 2021-08-05 ENCOUNTER — Encounter (HOSPITAL_COMMUNITY): Payer: Self-pay

## 2021-08-05 DIAGNOSIS — K2289 Other specified disease of esophagus: Secondary | ICD-10-CM | POA: Diagnosis not present

## 2021-08-05 DIAGNOSIS — R1310 Dysphagia, unspecified: Secondary | ICD-10-CM | POA: Insufficient documentation

## 2021-08-05 DIAGNOSIS — K449 Diaphragmatic hernia without obstruction or gangrene: Secondary | ICD-10-CM | POA: Diagnosis not present

## 2021-08-06 ENCOUNTER — Other Ambulatory Visit: Payer: Self-pay | Admitting: Rheumatology

## 2021-08-10 ENCOUNTER — Encounter: Payer: Medicare PPO | Admitting: Physical Therapy

## 2021-08-17 ENCOUNTER — Encounter: Payer: Medicare PPO | Admitting: Physical Therapy

## 2021-08-19 ENCOUNTER — Other Ambulatory Visit: Payer: Self-pay | Admitting: Gastroenterology

## 2021-08-20 NOTE — Progress Notes (Unsigned)
Office Visit Note  Patient: Christina Lewis             Date of Birth: 1955-04-10           MRN: [id number]             PCP: Monico Blitz, MD Referring: Monico Blitz, MD Visit Date: 09/02/2021 Occupation: _0 @  Subjective:  Pain in multiple joints   History of Present Illness: Christina Lewis is a 66 y.o. female with history of seronegative rheumatoid arthritis, osteoarthritis, and DDD.  She is taking plaquenil 200 mg 1 tablet by mouth twice daily Monday through Friday.  She continues tolerate plaquenil without any side effects.  She has been experiencing recurrent flares in multiple joints.  About 1-1/2 to 2 weeks ago she experienced a flare in her right elbow and last week she experienced a flare in both hands.  She was having swelling in her left wrist joint and in several MCP joints.  She continues to take meloxicam 15 mg most nights for pain relief.  She has intermittent myalgias and muscle tenderness due to fibromyalgia.  She states that her neck pain has improved since having dry needling performed.  She continues to experience intermittent pain and stiffness in both knees and both feet. She has been experiencing symptoms of dysphagia intermittently. Patient had a recent manometry performed on 08/24/21 and will be scheduling a follow up visit to discuss the results.     Activities of Daily Living:  Patient reports morning stiffness for several hours.   Patient Reports nocturnal pain.  Difficulty dressing/grooming: Denies Difficulty climbing stairs: Reports Difficulty getting out of chair: Reports Difficulty using hands for taps, buttons, cutlery, and/or writing: Reports  Review of Systems  Constitutional:  Positive for fatigue.  HENT:  Positive for mouth dryness. Negative for mouth sores and nose dryness.   Eyes:  Positive for itching and dryness. Negative for pain and visual disturbance.  Respiratory:  Negative for cough, hemoptysis, shortness of breath and difficulty  breathing.   Cardiovascular:  Negative for chest pain, palpitations, hypertension and swelling in legs/feet.  Gastrointestinal:  Negative for blood in stool, constipation and diarrhea.  Endocrine: Negative for increased urination.  Genitourinary:  Negative for painful urination and involuntary urination.  Musculoskeletal:  Positive for joint pain, joint pain, joint swelling, myalgias, muscle weakness, morning stiffness, muscle tenderness and myalgias.  Skin:  Negative for pallor, rash, hair loss, nodules/bumps, skin tightness, ulcers and sensitivity to sunlight.  Allergic/Immunologic: Negative for susceptible to infections.  Neurological:  Positive for headaches. Negative for dizziness, numbness and weakness.  Hematological:  Negative for swollen glands.  Psychiatric/Behavioral:  Positive for depressed mood and sleep disturbance. The patient is nervous/anxious.     PMFS History:  Patient Active Problem List   Diagnosis Date Noted   Cellulitis 03/04/2021   Rheumatoid arthritis ([place]) 03/04/2021   Diabetes mellitus type 2 in obese ([place]) 03/04/2021   Right foot infection 03/04/2021   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 myelopathy 09/11/2019   Leukocytosis 11/04/2016   Fibromyalgia 02/10/2016   Other fatigue 02/10/2016   Primary osteoarthritis of both hands 02/10/2016   History of migraine 02/10/2016   History of thyroid nodule 02/10/2016   History of sleep apnea 02/10/2016   History of renal calculi 02/10/2016   Pain in joint, shoulder region 10/02/2010   Adhesive capsulitis of shoulder 10/02/2010   Muscle weakness (generalized) 10/02/2010    Past Medical History:  Diagnosis Date   Arthritis    Asthma  2007   Depression    Diabetes mellitus    Fibromyalgia    GERD (gastroesophageal reflux disease)    Gout    Hematuria    History of kidney stones    History of tics    Interstitial cystitis 2016   Migraines    Osteoarthritis    PCOS (polycystic ovarian syndrome) 1993   Pneumonia  2019   PONV (postoperative nausea and vomiting)    Sleep apnea    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, took medication for a year   Thyroid nodule     Family History  Problem Relation Age of Onset   Fibromyalgia Mother    Psoriasis Mother        psoriatic arthritis    Diabetes Mother    Heart disease Mother    Psoriasis Sister        psoriatic arthritis    Diabetes Sister    Rheum arthritis Sister    Fibromyalgia Sister    Diabetes Sister    Past Surgical History:  Procedure Laterality Date   ABDOMINAL HYSTERECTOMY  2000   APPENDECTOMY  1976   BREAST LUMPECTOMY  1989   lt-negative   CARDIOVASCULAR STRESS TEST  2000   CATARACT EXTRACTION W/PHACO Left 12/07/2019   Procedure: CATARACT EXTRACTION PHACO AND INTRAOCULAR LENS PLACEMENT ([place]);  Surgeon: Baruch Goldmann, MD;  Location: [facility];  Service: Ophthalmology;  Laterality: Left;  CDE: 5.55   CHOLECYSTECTOMY  1985   COLONOSCOPY     ESOPHAGEAL MANOMETRY N/A 08/24/2021   Procedure: ESOPHAGEAL MANOMETRY (EM);  Surgeon: Ronnette Juniper, MD;  Location: [facility];  Service: Gastroenterology;  Laterality: N/A;   HYSTERECTOMY ABDOMINAL WITH SALPINGECTOMY Bilateral 2000   KIDNEY STONE SURGERY Right 2014   with stent placement in OR   KNEE ARTHROSCOPY Right 08/22/2013   Procedure: ARTHROSCOPY RIGHT KNEE FOR INFECTION LAVAGE AND DRAINAGE;  Surgeon: Yvette Rack., MD;  Location: [facility];  Service: Orthopedics;  Laterality: Right;   KNEE ARTHROSCOPY WITH PATELLA RECONSTRUCTION Right 08/06/2013   Procedure: RIGHT KNEE ARTHROSCOPY WITH MENISCECTOMY MEDIAL, ARTHROSCOPY KNEE WITH DEBRIDEMENT/SHAVING (CHONDROPLASTY) ;  Surgeon: Yvette Rack., MD;  Location: [facility];  Service: Orthopedics;  Laterality: Right;   LUMBAR LAMINECTOMY/DECOMPRESSION MICRODISCECTOMY Right 09/11/2019   Procedure: Right Lumbar Three-Four Microdiscectomy;  Surgeon: Erline Levine, MD;  Location: [facility];  Service:  Neurosurgery;  Laterality: Right;  Right Lumbar Three-Four Microdiscectomy   NASAL SINUS SURGERY     x4   OOPHORECTOMY     SHOULDER SURGERY  2011   right shoulder   THYROID SURGERY  1994   TONSILLECTOMY Bilateral 1974   Social History   Social History Narrative   Not on file   Immunization History  Administered Date(s) Administered   Tdap 03/04/2021     Objective: Vital Signs: BP 120/78 (BP Location: Left Arm, Patient Position: Sitting, Cuff Size: Normal)   Pulse 91   Ht _0  (1.626 m)   Wt 174 lb 6.4 oz (79.1 kg)   BMI 29.94 kg/m    Physical Exam Vitals and nursing note reviewed.  Constitutional:      Appearance: She is well-developed.  HENT:     Head: Normocephalic and atraumatic.  Eyes:     Conjunctiva/sclera: Conjunctivae normal.  Cardiovascular:     Rate and Rhythm: Normal rate and regular rhythm.     Heart sounds: Normal heart sounds.  Pulmonary:     Effort: Pulmonary  effort is normal.     Breath sounds: Normal breath sounds.  Abdominal:     General: Bowel sounds are normal.     Palpations: Abdomen is soft.  Musculoskeletal:     Cervical back: Normal range of motion.  Skin:    General: Skin is warm and dry.     Capillary Refill: Capillary refill takes less than 2 seconds.  Neurological:     Mental Status: She is alert and oriented to person, place, and time.  Psychiatric:        Behavior: Behavior normal.      Musculoskeletal Exam: C-spine has limited ROM with lateral rotation.  Trapezius muscle tension and tenderness bilaterally.  Shoulder joints have good range of motion with no discomfort.  Tenderness palpation over the right lateral epicondyle noted.  Tenderness over the left wrist joint noted.  Tenderness of the right second through fifth MCPs and left third and fourth MCP joints.  Complete fist formation noted bilaterally.  PIP and DIP thickening consistent with osteoarthritis of both hands.  Hip joints have good range of motion with no groin pain.   Knee joints have good range of motion with no warmth or effusion.  Ankle joints have good range of motion with no tenderness or synovitis.  No tenderness over MTP joints.  CDAI Exam: CDAI Score: 9  Patient Global: 5 mm; Provider Global: 5 mm Swollen: 0 ; Tender: 8  Joint Exam 09/02/2021      Right  Left  Elbow   Tender     Wrist      Tender  MCP 2   Tender     MCP 3   Tender   Tender  MCP 4   Tender   Tender  MCP 5   Tender        Investigation: No additional findings.  Imaging: DG ESOPHAGUS W DOUBLE CM (HD)  Result Date: 08/05/2021 CLINICAL DATA:  Complaint of intermittent dysphagia. Occasional inability to dry swallow. Prior history of esophageal dilatation and gastroesophageal reflux. EXAM: ESOPHAGUS/BARIUM SWALLOW/TABLET STUDY TECHNIQUE: Combined double and single contrast examination was performed using effervescent crystals, high-density barium and thin liquid barium. This exam was performed by Ascencion Dike PA-C, and was supervised and interpreted by Dr. Nelson Chimes. FLUOROSCOPY: Radiation Exposure Index (as provided by the fluoroscopic device): 35.80 mGy Kerma COMPARISON:  None Available. FINDINGS: Esophagus: Prominent cricopharyngeus, may be of symptomatic significance. No other mucosal lesions or strictures identified. Esophageal motility: Nonspecific esophageal dysmotility with poor primary wave stripping and disorganized tertiary contractions, resulting in esophageal contrast stasis. Gastroesophageal reflux: None visualized. Ingested 13 mm barium tablet: Passed normally Stomach: Normal appearance.  Small hiatal hernia. Gastric emptying: Normal. 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 Nonspecific esophageal dysmotility with poor primary wave contraction and disorganized tertiary contractions. Small hiatal hernia. Read by: Ascencion Dike PA-C Electronically Signed   By: Nelson Chimes M.D.   On: 08/05/2021 11:32    Recent Labs: Lab Results   Component Value Date   WBC 15.5 (H) 03/05/2021   HGB 10.7 (L) 03/05/2021   PLT 273 03/05/2021   NA 137 03/05/2021   K 4.1 03/05/2021   CL 106 03/05/2021   CO2 22 03/05/2021   GLUCOSE 141 (H) 03/05/2021   BUN 9 03/05/2021   CREATININE 0.69 03/05/2021   BILITOT 0.5 03/05/2021   ALKPHOS 54 03/05/2021   AST 15 03/05/2021   ALT 26 03/05/2021   PROT 6.1 (L) 03/05/2021   ALBUMIN 3.4 (L) 03/05/2021  CALCIUM 8.9 03/05/2021   GFRAA 98 03/24/2020    Speciality Comments: PLQ Eye Exam: 02/12/2021 Normal @ [facility]  f/u 01/2022  Procedures:  No procedures performed Allergies: Sulfa antibiotics, Codeine, Erythromycin, Metronidazole, and Morphine and related   Assessment / Plan:     Visit Diagnoses: Rheumatoid arthritis of multiple sites with negative rheumatoid factor ([place]) - She has been experiencing increased pain and intermittent inflammation involving multiple joints.  She had a flare in the right elbow 1-1/2 to 2 weeks ago at which time she was experiencing tenderness and swelling.  Last week she had a flare in both hands and the left wrist joint.  On examination today she has tenderness over several MCP joints as described above but no obvious synovitis was noted on examination today.  She continues to take Plaquenil 200 mg 1 tablet by mouth twice daily Monday through Friday.  She has been taking meloxicam 15 mg at bedtime as needed for symptomatic relief.  X-rays of both hands and feet were obtained on 04/09/2020 which were consistent with osteoarthritic changes.  No erosive changes were noted.  Noted radiographic impression since 2020 was noted at that time.  She had ultrasound of both feet on 07/16/2020 which was negative for synovitis.  Discussed proceeding with an ultrasound of both hands to assess for synovitis prior to moving on to more aggressive treatment.  She was advised to hold meloxicam and natural anti-inflammatories for 7 to 10 days prior to the ultrasound.  ESR,  CRP, and anti-CCP were checked today.  She will remain on Plaquenil as prescribed.  Plan: Sedimentation rate, C-reactive protein, Cyclic citrul peptide antibody, IgG, hydroxychloroquine (PLAQUENIL) 200 MG tablet  High risk medication use - Plaquenil 200 mg 1 tablet by mouth twice daily Monday through Friday.  Tolerating Plaquenil without any side effects.  A refill was sent to the pharmacy today. CBC and CMP updated on 03/05/21. Orders for CBC and CMP were released. PLQ Eye Exam: 02/12/2021 Normal @ [facility].  - Plan: COMPLETE METABOLIC PANEL WITH GFR, CBC with Differential/Platelet  Primary osteoarthritis of both hands: Radiographic findings consistent with osteoarthritis on 04/09/2020 with no radiographic progression compared to 2020 x-rays.  She has PIP and DIP thickening consistent with osteoarthritis of both hands.  She has been experiencing increased pain and intermittent inflammation in both hands especially in her MCP joints.  On examination today no synovitis was noted.  She will be scheduled for an ultrasound of both hands to assess for synovitis prior to making any other medication changes at this time.  Primary osteoarthritis of right knee: She continues to experience intermittent pain and stiffness in both knees especially her right knee.  On examination today no warmth or effusion was noted.  Primary osteoarthritis of both feet: She continues to experience intermittent discomfort in both feet.  She has good range of motion of both ankle joints with no tenderness or synovitis.  Ultrasound of both feet performed on 07/16/2020 was negative for synovitis.  Trochanteric bursitis of right hip: She has intermittent discomfort in her right hip.  Other fatigue: Chronic, stable.   DDD (degenerative disc disease), cervical: She has limited ROM of the C-spine.  MRI of cervical spine on 05/21/21 revealed mild cervical spondylosis.  Mild left foraminal narrowing C3-C4 and C5-C6.   [facility].  Patients neck pain has improved since undergoing dry needling.   DDD (degenerative disc disease), lumbar: She is not experiencing any increased discomfort in her  lower back at this time.  No symptoms of radiculopathy.   Chronic SI joint pain: No SI joint tenderness currently.   Fibromyalgia: She continues to experience intermittent myalgias and muscle tenderness due to fibromyalgia.  She has been experiencing intermittent flares.  She continues to have chronic fatigue which is overall been stable.  Discussed importance of regular exercise and good sleep hygiene.  She takes baclofen 10 mg 1 tablet in the morning and 1 tablet at noon and tizanidine 4 mg at bedtime as needed for muscle spasms.  She remains on Cymbalta 120 mg daily and takes gabapentin 600 mg twice daily.  Family history of rheumatoid arthritis - Sister  Family history of psoriatic arthritis - Sister and mother  Other medical conditions are listed as follows:   History of tics  History of sleep apnea  History of diabetes mellitus  History of thyroid nodule  History of gastroesophageal reflux (GERD)  History of PCOS  History of migraine  History of renal calculi  History of positive PPD  History of depression  History of cellulitis  Orders: Orders Placed This Encounter  Procedures   COMPLETE METABOLIC PANEL WITH GFR   CBC with Differential/Platelet   Sedimentation rate   C-reactive protein   Cyclic citrul peptide antibody, IgG   Meds ordered this encounter  Medications   hydroxychloroquine (PLAQUENIL) 200 MG tablet    Sig: TAKE 1 TABLET TWICE DAILY MONDAY THRU FRIDAY FOR RHEUMATOID ARTHRITIS.    Dispense:  120 tablet    Refill:  0     Follow-Up Instructions: Return in about 3 months (around 12/03/2021) for Rheumatoid arthritis, Osteoarthritis, DDD.   Ofilia Neas, PA-C  Note - This record has been created using Dragon software.  Chart creation errors have been sought,  but may not always  have been located. Such creation errors do not reflect on  the standard of medical care.

## 2021-08-24 ENCOUNTER — Ambulatory Visit (HOSPITAL_COMMUNITY)
Admission: RE | Admit: 2021-08-24 | Discharge: 2021-08-24 | Disposition: A | Discharge status: Home / Self Care | Payer: Medicare PPO | Attending: Gastroenterology | Admitting: Gastroenterology

## 2021-08-24 ENCOUNTER — Encounter (HOSPITAL_COMMUNITY)
Admission: RE | Disposition: A | Discharge status: Home / Self Care | Payer: Self-pay | Source: Home / Self Care | Attending: Gastroenterology

## 2021-08-24 DIAGNOSIS — R1310 Dysphagia, unspecified: Secondary | ICD-10-CM | POA: Insufficient documentation

## 2021-08-24 HISTORY — PX: ESOPHAGEAL MANOMETRY: SHX5429

## 2021-08-24 SURGERY — MANOMETRY, ESOPHAGUS

## 2021-08-24 MED ORDER — LIDOCAINE VISCOUS HCL 2 % MT SOLN
OROMUCOSAL | Status: AC
  Filled 2021-08-24: qty 15

## 2021-08-24 SURGICAL SUPPLY — 2 items
FACESHIELD LNG OPTICON STERILE (SAFETY) IMPLANT
GLOVE BIO SURGEON STRL SZ8 (GLOVE) ×4 IMPLANT

## 2021-08-24 NOTE — Progress Notes (Signed)
Esophageal Manometry done per protocol. Patient tolerated well without distress or complication.  

## 2021-08-26 ENCOUNTER — Encounter (HOSPITAL_COMMUNITY): Payer: Self-pay | Admitting: Gastroenterology

## 2021-09-02 ENCOUNTER — Encounter: Payer: Self-pay | Admitting: Physician Assistant

## 2021-09-02 ENCOUNTER — Ambulatory Visit: Payer: Medicare PPO | Admitting: Physician Assistant

## 2021-09-02 VITALS — BP 120/78 | HR 91 | Ht 64.0 in | Wt 174.4 lb

## 2021-09-02 DIAGNOSIS — M51369 Other intervertebral disc degeneration, lumbar region without mention of lumbar back pain or lower extremity pain: Secondary | ICD-10-CM

## 2021-09-02 DIAGNOSIS — M19071 Primary osteoarthritis, right ankle and foot: Secondary | ICD-10-CM

## 2021-09-02 DIAGNOSIS — R5383 Other fatigue: Secondary | ICD-10-CM

## 2021-09-02 DIAGNOSIS — M1711 Unilateral primary osteoarthritis, right knee: Secondary | ICD-10-CM | POA: Diagnosis not present

## 2021-09-02 DIAGNOSIS — Z9289 Personal history of other medical treatment: Secondary | ICD-10-CM

## 2021-09-02 DIAGNOSIS — M533 Sacrococcygeal disorders, not elsewhere classified: Secondary | ICD-10-CM

## 2021-09-02 DIAGNOSIS — Z8659 Personal history of other mental and behavioral disorders: Secondary | ICD-10-CM

## 2021-09-02 DIAGNOSIS — Z87442 Personal history of urinary calculi: Secondary | ICD-10-CM

## 2021-09-02 DIAGNOSIS — M797 Fibromyalgia: Secondary | ICD-10-CM

## 2021-09-02 DIAGNOSIS — Z840 Family history of diseases of the skin and subcutaneous tissue: Secondary | ICD-10-CM

## 2021-09-02 DIAGNOSIS — G8929 Other chronic pain: Secondary | ICD-10-CM

## 2021-09-02 DIAGNOSIS — Z8261 Family history of arthritis: Secondary | ICD-10-CM

## 2021-09-02 DIAGNOSIS — M19042 Primary osteoarthritis, left hand: Secondary | ICD-10-CM

## 2021-09-02 DIAGNOSIS — M7061 Trochanteric bursitis, right hip: Secondary | ICD-10-CM | POA: Diagnosis not present

## 2021-09-02 DIAGNOSIS — M5030 Other cervical disc degeneration, unspecified cervical region: Secondary | ICD-10-CM

## 2021-09-02 DIAGNOSIS — M0609 Rheumatoid arthritis without rheumatoid factor, multiple sites: Secondary | ICD-10-CM

## 2021-09-02 DIAGNOSIS — M5136 Other intervertebral disc degeneration, lumbar region: Secondary | ICD-10-CM

## 2021-09-02 DIAGNOSIS — Z8669 Personal history of other diseases of the nervous system and sense organs: Secondary | ICD-10-CM

## 2021-09-02 DIAGNOSIS — Z8639 Personal history of other endocrine, nutritional and metabolic disease: Secondary | ICD-10-CM

## 2021-09-02 DIAGNOSIS — M5416 Radiculopathy, lumbar region: Secondary | ICD-10-CM | POA: Diagnosis not present

## 2021-09-02 DIAGNOSIS — M19041 Primary osteoarthritis, right hand: Secondary | ICD-10-CM | POA: Diagnosis not present

## 2021-09-02 DIAGNOSIS — Z872 Personal history of diseases of the skin and subcutaneous tissue: Secondary | ICD-10-CM

## 2021-09-02 DIAGNOSIS — M47816 Spondylosis without myelopathy or radiculopathy, lumbar region: Secondary | ICD-10-CM | POA: Diagnosis not present

## 2021-09-02 DIAGNOSIS — Z8742 Personal history of other diseases of the female genital tract: Secondary | ICD-10-CM

## 2021-09-02 DIAGNOSIS — Z79899 Other long term (current) drug therapy: Secondary | ICD-10-CM | POA: Diagnosis not present

## 2021-09-02 DIAGNOSIS — M19072 Primary osteoarthritis, left ankle and foot: Secondary | ICD-10-CM

## 2021-09-02 DIAGNOSIS — Z8719 Personal history of other diseases of the digestive system: Secondary | ICD-10-CM

## 2021-09-02 DIAGNOSIS — M4120 Other idiopathic scoliosis, site unspecified: Secondary | ICD-10-CM | POA: Diagnosis not present

## 2021-09-02 MED ORDER — HYDROXYCHLOROQUINE SULFATE 200 MG PO TABS: ORAL_TABLET | ORAL | 0 refills | Status: DC

## 2021-09-03 ENCOUNTER — Ambulatory Visit: Payer: Medicare PPO | Admitting: Rheumatology

## 2021-09-03 ENCOUNTER — Other Ambulatory Visit: Payer: Self-pay | Admitting: Rheumatology

## 2021-09-03 NOTE — Progress Notes (Signed)
CRP and ESR WNL.   Absolute eosinophils are borderline elevated. Rest of CBC WNL.  Creatinine is borderline elevated-1.08 and GFR is slightly low at 57.  Patient should avoid the use of NSAIDs including meloxicam.

## 2021-09-04 LAB — CBC WITH DIFFERENTIAL/PLATELET
Absolute Monocytes: 903 cells/uL (ref 200–950)
Basophils Absolute: 126 cells/uL (ref 0–200)
Basophils Relative: 1.2 %
Eosinophils Absolute: 536 cells/uL — ABNORMAL HIGH (ref 15–500)
Eosinophils Relative: 5.1 %
HCT: 40.5 % (ref 35.0–45.0)
Hemoglobin: 13.9 g/dL (ref 11.7–15.5)
Lymphs Abs: 3581 cells/uL (ref 850–3900)
MCH: 31.5 pg (ref 27.0–33.0)
MCHC: 34.3 g/dL (ref 32.0–36.0)
MCV: 91.8 fL (ref 80.0–100.0)
MPV: 10.3 fL (ref 7.5–12.5)
Monocytes Relative: 8.6 %
Neutro Abs: 5355 cells/uL (ref 1500–7800)
Neutrophils Relative %: 51 %
Platelets: 392 10*3/uL (ref 140–400)
RBC: 4.41 10*6/uL (ref 3.80–5.10)
RDW: 13.1 % (ref 11.0–15.0)
Total Lymphocyte: 34.1 %
WBC: 10.5 10*3/uL (ref 3.8–10.8)

## 2021-09-04 LAB — COMPLETE METABOLIC PANEL WITH GFR
AG Ratio: 2.1 (calc) (ref 1.0–2.5)
ALT: 21 U/L (ref 6–29)
AST: 20 U/L (ref 10–35)
Albumin: 4.7 g/dL (ref 3.6–5.1)
Alkaline phosphatase (APISO): 64 U/L (ref 37–153)
BUN/Creatinine Ratio: 20 (calc) (ref 6–22)
BUN: 22 mg/dL (ref 7–25)
CO2: 27 mmol/L (ref 20–32)
Calcium: 10.4 mg/dL (ref 8.6–10.4)
Chloride: 105 mmol/L (ref 98–110)
Creat: 1.08 mg/dL — ABNORMAL HIGH (ref 0.50–1.05)
Globulin: 2.2 g/dL (calc) (ref 1.9–3.7)
Glucose, Bld: 85 mg/dL (ref 65–99)
Potassium: 4.5 mmol/L (ref 3.5–5.3)
Sodium: 140 mmol/L (ref 135–146)
Total Bilirubin: 0.2 mg/dL (ref 0.2–1.2)
Total Protein: 6.9 g/dL (ref 6.1–8.1)
eGFR: 57 mL/min/{1.73_m2} — ABNORMAL LOW (ref >=60)

## 2021-09-04 LAB — CYCLIC CITRUL PEPTIDE ANTIBODY, IGG: Cyclic Citrullin Peptide Ab: <16 UNITS

## 2021-09-04 LAB — SEDIMENTATION RATE: Sed Rate: 6 mm/h (ref 0–30)

## 2021-09-04 LAB — C-REACTIVE PROTEIN: CRP: 1 mg/L (ref <8.0)

## 2021-09-04 NOTE — Progress Notes (Signed)
Anti-CCP negative.

## 2021-09-04 NOTE — Telephone Encounter (Signed)
Next Visit: 12/09/2021  Last Visit: 09/02/2021  Last Fill: 08/06/2021  Dx: Fibromyalgia  Current Dose per office note on 09/02/2021: baclofen 10 mg 1 tablet in the morning and 1 tablet at noon and tizanidine 4 mg at bedtime as needed for muscle spasms  Okay to refill Baclofen and Tizanidine?

## 2021-09-06 DIAGNOSIS — R1310 Dysphagia, unspecified: Secondary | ICD-10-CM | POA: Diagnosis not present

## 2021-09-06 DIAGNOSIS — R933 Abnormal findings on diagnostic imaging of other parts of digestive tract: Secondary | ICD-10-CM | POA: Diagnosis not present

## 2021-09-24 ENCOUNTER — Ambulatory Visit: Payer: Medicare PPO | Admitting: Family Medicine

## 2021-09-29 ENCOUNTER — Encounter: Payer: Self-pay | Admitting: Rheumatology

## 2021-09-29 ENCOUNTER — Ambulatory Visit (INDEPENDENT_AMBULATORY_CARE_PROVIDER_SITE_OTHER): Payer: Medicare PPO

## 2021-09-29 ENCOUNTER — Ambulatory Visit: Payer: Medicare PPO | Attending: Rheumatology | Admitting: Rheumatology

## 2021-09-29 VITALS — BP 135/81 | HR 76 | Resp 16 | Ht 64.0 in

## 2021-09-29 DIAGNOSIS — Z8742 Personal history of other diseases of the female genital tract: Secondary | ICD-10-CM

## 2021-09-29 DIAGNOSIS — M0609 Rheumatoid arthritis without rheumatoid factor, multiple sites: Secondary | ICD-10-CM

## 2021-09-29 DIAGNOSIS — M19041 Primary osteoarthritis, right hand: Secondary | ICD-10-CM

## 2021-09-29 DIAGNOSIS — Z8719 Personal history of other diseases of the digestive system: Secondary | ICD-10-CM

## 2021-09-29 DIAGNOSIS — M19071 Primary osteoarthritis, right ankle and foot: Secondary | ICD-10-CM

## 2021-09-29 DIAGNOSIS — Z87442 Personal history of urinary calculi: Secondary | ICD-10-CM

## 2021-09-29 DIAGNOSIS — Z8669 Personal history of other diseases of the nervous system and sense organs: Secondary | ICD-10-CM

## 2021-09-29 DIAGNOSIS — M79642 Pain in left hand: Secondary | ICD-10-CM

## 2021-09-29 DIAGNOSIS — G8929 Other chronic pain: Secondary | ICD-10-CM

## 2021-09-29 DIAGNOSIS — M5136 Other intervertebral disc degeneration, lumbar region: Secondary | ICD-10-CM

## 2021-09-29 DIAGNOSIS — R5383 Other fatigue: Secondary | ICD-10-CM

## 2021-09-29 DIAGNOSIS — Z8639 Personal history of other endocrine, nutritional and metabolic disease: Secondary | ICD-10-CM

## 2021-09-29 DIAGNOSIS — Z8659 Personal history of other mental and behavioral disorders: Secondary | ICD-10-CM

## 2021-09-29 DIAGNOSIS — M79641 Pain in right hand: Secondary | ICD-10-CM | POA: Diagnosis not present

## 2021-09-29 DIAGNOSIS — M797 Fibromyalgia: Secondary | ICD-10-CM

## 2021-09-29 DIAGNOSIS — Z79899 Other long term (current) drug therapy: Secondary | ICD-10-CM | POA: Diagnosis not present

## 2021-09-29 DIAGNOSIS — Z1159 Encounter for screening for other viral diseases: Secondary | ICD-10-CM | POA: Diagnosis not present

## 2021-09-29 DIAGNOSIS — M5030 Other cervical disc degeneration, unspecified cervical region: Secondary | ICD-10-CM

## 2021-09-29 DIAGNOSIS — Z840 Family history of diseases of the skin and subcutaneous tissue: Secondary | ICD-10-CM

## 2021-09-29 DIAGNOSIS — Z872 Personal history of diseases of the skin and subcutaneous tissue: Secondary | ICD-10-CM

## 2021-09-29 DIAGNOSIS — M7061 Trochanteric bursitis, right hip: Secondary | ICD-10-CM

## 2021-09-29 DIAGNOSIS — M1711 Unilateral primary osteoarthritis, right knee: Secondary | ICD-10-CM

## 2021-09-29 DIAGNOSIS — Z9289 Personal history of other medical treatment: Secondary | ICD-10-CM

## 2021-09-29 DIAGNOSIS — Z8261 Family history of arthritis: Secondary | ICD-10-CM

## 2021-09-29 NOTE — Progress Notes (Signed)
Office Visit Note  Patient: Christina Lewis             Date of Birth: November 14, 1955           MRN: [id number]             PCP: Monico Blitz, MD Referring: Monico Blitz, MD Visit Date: 09/29/2021 Occupation: @GUAROCC @  Subjective:  Medication Management (Bil hand pain)   History of Present Illness: Christina Lewis is a 66 y.o. female with history of seronegative rheumatoid arthritis, osteoarthritis and degenerative disc disease.  She states she continues to have pain and discomfort in multiple joints.  She complains of pain and swelling in the bilateral hands.  She states she was taking meloxicam 15 mg p.o. daily which she stopped after the last visit.  She has been experiencing pain and discomfort in her bilateral shoulders, right elbow, bilateral wrist joints, bilateral hands, her knees and her feet.  She also has generalized pain in her muscles due to underlying fibromyalgia syndrome.  Activities of Daily Living:  Patient reports morning stiffness for 24 hours.   Patient Reports nocturnal pain.  Difficulty dressing/grooming: Denies Difficulty climbing stairs: Reports Difficulty getting out of chair: Reports Difficulty using hands for taps, buttons, cutlery, and/or writing: Reports  Review of Systems  Constitutional:  Positive for fatigue.  HENT:  Positive for mouth sores and mouth dryness.   Eyes:  Positive for redness and dryness.  Respiratory:  Negative for shortness of breath.   Cardiovascular:  Negative for chest pain and palpitations.  Gastrointestinal:  Negative for blood in stool, constipation and diarrhea.  Endocrine: Negative for increased urination.  Genitourinary:  Negative for involuntary urination.  Musculoskeletal:  Positive for joint pain, joint pain, joint swelling, morning stiffness and muscle tenderness. Negative for myalgias, muscle weakness and myalgias.  Skin:  Positive for color change and rash. Negative for hair loss and sensitivity to sunlight.   Allergic/Immunologic: Negative for susceptible to infections.  Neurological:  Positive for dizziness and headaches.  Hematological:  Negative for swollen glands.  Psychiatric/Behavioral:  Positive for depressed mood and sleep disturbance. The patient is nervous/anxious.     PMFS History:  Patient Active Problem List   Diagnosis Date Noted   Cellulitis 03/04/2021   Rheumatoid arthritis ([place]) 03/04/2021   Diabetes mellitus type 2 in obese ([place]) 03/04/2021   Right foot infection 03/04/2021   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 myelopathy 09/11/2019   Leukocytosis 11/04/2016   Fibromyalgia 02/10/2016   Other fatigue 02/10/2016   Primary osteoarthritis of both hands 02/10/2016   History of migraine 02/10/2016   History of thyroid nodule 02/10/2016   History of sleep apnea 02/10/2016   History of renal calculi 02/10/2016   Pain in joint, shoulder region 10/02/2010   Adhesive capsulitis of shoulder 10/02/2010   Muscle weakness (generalized) 10/02/2010    Past Medical History:  Diagnosis Date   Arthritis    Asthma 2007   Depression    Diabetes mellitus    Fibromyalgia    GERD (gastroesophageal reflux disease)    Gout    Hematuria    History of kidney stones    History of tics    Ineffective esophageal motility    Interstitial cystitis 2016   Migraines    Osteoarthritis    PCOS (polycystic ovarian syndrome) 1993   Pneumonia 2019   PONV (postoperative nausea and vomiting)    Sleep apnea    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, took medication  for a year   Thyroid nodule     Family History  Problem Relation Age of Onset   Fibromyalgia Mother    Psoriasis Mother        psoriatic arthritis    Diabetes Mother    Heart disease Mother    Psoriasis Sister        psoriatic arthritis    Diabetes Sister    Rheum arthritis Sister    Fibromyalgia Sister    Diabetes Sister    Past Surgical History:  Procedure Laterality Date   ABDOMINAL HYSTERECTOMY  2000    APPENDECTOMY  1976   BREAST LUMPECTOMY  1989   lt-negative   CARDIOVASCULAR STRESS TEST  2000   CATARACT EXTRACTION W/PHACO Left 12/07/2019   Procedure: CATARACT EXTRACTION PHACO AND INTRAOCULAR LENS PLACEMENT ([place]);  Surgeon: Baruch Goldmann, MD;  Location: [facility];  Service: Ophthalmology;  Laterality: Left;  CDE: 5.55   CHOLECYSTECTOMY  1985   COLONOSCOPY     ESOPHAGEAL MANOMETRY N/A 08/24/2021   Procedure: ESOPHAGEAL MANOMETRY (EM);  Surgeon: Ronnette Juniper, MD;  Location: [facility];  Service: Gastroenterology;  Laterality: N/A;   HYSTERECTOMY ABDOMINAL WITH SALPINGECTOMY Bilateral 2000   KIDNEY STONE SURGERY Right 2014   with stent placement in OR   KNEE ARTHROSCOPY Right 08/22/2013   Procedure: ARTHROSCOPY RIGHT KNEE FOR INFECTION LAVAGE AND DRAINAGE;  Surgeon: Yvette Rack., MD;  Location: [place];  Service: Orthopedics;  Laterality: Right;   KNEE ARTHROSCOPY WITH PATELLA RECONSTRUCTION Right 08/06/2013   Procedure: RIGHT KNEE ARTHROSCOPY WITH MENISCECTOMY MEDIAL, ARTHROSCOPY KNEE WITH DEBRIDEMENT/SHAVING (CHONDROPLASTY) ;  Surgeon: Yvette Rack., MD;  Location: [place];  Service: Orthopedics;  Laterality: Right;   LUMBAR LAMINECTOMY/DECOMPRESSION MICRODISCECTOMY Right 09/11/2019   Procedure: Right Lumbar Three-Four Microdiscectomy;  Surgeon: Erline Levine, MD;  Location: [place];  Service: Neurosurgery;  Laterality: Right;  Right Lumbar Three-Four Microdiscectomy   NASAL SINUS SURGERY     x4   OOPHORECTOMY     SHOULDER SURGERY  2011   right shoulder   THYROID SURGERY  1994   TONSILLECTOMY Bilateral 1974   Social History   Social History Narrative   Not on file   Immunization History  Administered Date(s) Administered   Tdap 03/04/2021     Objective: Vital Signs: BP 135/81 (BP Location: Left Arm, Patient Position: Sitting, Cuff Size: Normal)   Pulse 76   Resp 16   Ht 5\' 4"  (1.626 m)   BMI 29.94 kg/m    Physical Exam Vitals and nursing  note reviewed.  Constitutional:      Appearance: She is well-developed.  HENT:     Head: Normocephalic and atraumatic.  Eyes:     Conjunctiva/sclera: Conjunctivae normal.  Cardiovascular:     Rate and Rhythm: Normal rate and regular rhythm.     Heart sounds: Normal heart sounds.  Pulmonary:     Effort: Pulmonary effort is normal.     Breath sounds: Normal breath sounds.  Abdominal:     General: Bowel sounds are normal.     Palpations: Abdomen is soft.  Musculoskeletal:     Cervical back: Normal range of motion.  Lymphadenopathy:     Cervical: No cervical adenopathy.  Skin:    General: Skin is warm and dry.     Capillary Refill: Capillary refill takes less than 2 seconds.  Neurological:     Mental Status: She is alert and oriented to person, place, and time.  Psychiatric:  Behavior: Behavior normal.      Musculoskeletal Exam: She had limited lateral rotation of the cervical spine.  She had bilateral trapezius spasm.  Shoulder joints, elbow joints, wrist joints were in good range of motion.  She had tenderness on palpation of bilateral shoulders, right elbow, bilateral wrist joints and bilateral MCP joints and PIP joints as described below.  She also had discomfort range of motion of her knee joints and tenderness over her toes.  No synovitis was noted.  CDAI Exam: CDAI Score: 15.2  Patient Global: 7 mm; Provider Global: 5 mm Swollen: 0 ; Tender: 15  Joint Exam 09/29/2021      Right  Left  Glenohumeral   Tender   Tender  Elbow   Tender     Wrist   Tender   Tender  MCP 1      Tender  MCP 2   Tender   Tender  MCP 3   Tender   Tender  MCP 4      Tender  MCP 5      Tender  PIP 2      Tender  PIP 3      Tender  MTP 1   Tender        Investigation: No additional findings.  Imaging: [place] COMPLETE JOINT SPACE STRUCTURES UP BILAT  Result Date: 09/29/2021 Ultrasound examination of bilateral hands was performed per EULAR recommendations. Using 15 MHz transducer,  grayscale and power Doppler bilateral second, third, and fifth MCP joints and bilateral wrist joints both dorsal and volar aspects were evaluated to look for synovitis or tenosynovitis. The findings were there was  synovitis in bilateral second third and fifth MCP joints.  Synovitis was also noted in bilateral wrist joints on ultrasound examination. Right median nerve was 0.11 cm squares which was within normal limits and left median nerve was 0.11 cm squares which was within normal limits. Impression: Ultrasound examination showed synovitis in bilateral MCPs and wrist joints.  Bilateral median nerves are within normal limits.   Recent Labs: Lab Results  Component Value Date   WBC 10.5 09/02/2021   HGB 13.9 09/02/2021   PLT 392 09/02/2021   NA 140 09/02/2021   K 4.5 09/02/2021   CL 105 09/02/2021   CO2 27 09/02/2021   GLUCOSE 85 09/02/2021   BUN 22 09/02/2021   CREATININE 1.08 (H) 09/02/2021   BILITOT 0.2 09/02/2021   ALKPHOS 54 03/05/2021   AST 20 09/02/2021   ALT 21 09/02/2021   PROT 6.9 09/02/2021   ALBUMIN 3.4 (L) 03/05/2021   CALCIUM 10.4 09/02/2021   GFRAA 98 03/24/2020    Speciality Comments: PLQ Eye Exam: 02/12/2021 Normal @ [facility]  f/u 01/2022  Procedures:  No procedures performed Allergies: Sulfa antibiotics, Codeine, Erythromycin, Metronidazole, and Morphine and related   Assessment / Plan:     Visit Diagnoses: Rheumatoid arthritis of multiple sites with negative rheumatoid factor (HCC)-she complains of increased pain and discomfort in multiple joints.  She states she is in constant pain.  She complains of discomfort in her shoulders, L right elbow, bilateral wrist joints, bilateral hands, knees and her feet.  No synovitis was noted.  She had multiple tender joints.  Ultrasound examination of bilateral hands and wrist joints showed synovitis in the MCPs and wrist joints.  Patient has been taking hydroxychloroquine 200 mg p.o. twice daily Monday to Friday  which has not been effective.  Different treatment options and their side effects were discussed at  length.  I discussed the option of starting her on methotrexate.  Handout was given and consent was taken.  Side effects were discussed at length.  She wants to proceed with methotrexate.  I will obtain BMP with GFR today.  If her BMP is normal then we will start her on methotrexate 6 tablets p.o. weekly along with folic acid 2 mg p.o. daily.  Levels will be obtained in 2 weeks.  If labs are normal we will increase the dose of methotrexate to 8 tablets p.o. weekly.  She will continue hydroxychloroquine 200 mg p.o. twice daily Monday to Friday for now.  Drug Counseling TB Gold: Pending Hepatitis panel: Pending  Chest-xray: June 05, 2020  Contraception: Postmenopausal  Alcohol use: None  Patient was counseled on the purpose, proper use, and adverse effects of methotrexate including nausea, infection, and signs and symptoms of pneumonitis.  Reviewed instructions with patient to take methotrexate weekly along with folic acid daily.  Discussed the importance of frequent monitoring of kidney and liver function and blood counts, and provided patient with standing lab instructions.  Counseled patient to avoid NSAIDs and alcohol while on methotrexate.  Provided patient with educational materials on methotrexate and answered all questions.  Advised patient to get annual influenza vaccine and to get a pneumococcal vaccine if patient has not already had one.  Patient voiced understanding.  Patient consented to methotrexate use.  Will upload into chart.     Pain in both hands - Plan: [place] COMPLETE JOINT SPACE STRUCTURES UP BILAT.  Ultrasound examination of bilateral hands obtained today showed synovitis in bilateral MCPs and wrist joints.  Ultrasound findings were reviewed with the patient.  Bilateral median nerves are within normal limits.  High risk medication use - Plaquenil 200 mg 1 tablet by mouth twice daily  Monday through Friday. PLQ Eye Exam: 02/12/2021  -September 02, 2021 CBC with differential was normal.  CMP with GFR showed creatinine elevated 1.08.  Patient was taking meloxicam at the time.  She was advised to stop meloxicam.  Will check BMP again today.  Plan: BASIC METABOLIC PANEL WITH GFR, Hepatitis B core antibody, IgM, Hepatitis B surface antigen, Hepatitis C antibody, QuantiFERON-TB Gold Plus, Serum protein electrophoresis with reflex, IgG, IgA, IgM  Primary osteoarthritis of both hands -she has bilateral PIP and DIP thickening and CMC thickening.  She continues to have some discomfort due to underlying osteoarthritis.  Radiographic findings consistent with osteoarthritis on 04/09/2020 with no radiographic progression compared to 2020 x-rays.  Primary osteoarthritis of right knee-she has chronic pain in her bilateral knee joints.  Primary osteoarthritis of both feet-she complains of pain and discomfort in her feet.  She had tenderness over right first MTP joint.  Trochanteric bursitis of right hip-she continues to have tenderness over trochanteric bursa.  IT band stretches were discussed.  Other fatigue-most likely related to fibromyalgia.  DDD (degenerative disc disease), cervical - MRI of cervical spine on 05/21/21 revealed mild cervical spondylosis.  Mild left foraminal narrowing C3-C4 and C5-C6.   DDD (degenerative disc disease), lumbar  Chronic SI joint pain-she denies discomfort today.  Fibromyalgia -she has generalized pain and discomfort from fibromyalgia.  She is on Cymbalta 120 mg daily and takes gabapentin 600 mg twice daily.  Need for regular exercise was emphasized.  Family history of rheumatoid arthritis-Sister.  Family history of psoriatic arthritis-in her sister and mother.  Other medical problems listed as follows:  History of tics  History of diabetes mellitus  History of sleep apnea  History of thyroid nodule  History of gastroesophageal reflux (GERD)  History  of renal calculi  History of depression  History of cellulitis  History of positive PPD  History of migraine  History of PCOS  Orders: Orders Placed This Encounter  Procedures   [place] COMPLETE JOINT SPACE STRUCTURES UP BILAT   BASIC METABOLIC PANEL WITH GFR   Hepatitis B core antibody, IgM   Hepatitis B surface antigen   Hepatitis C antibody   QuantiFERON-TB Gold Plus   Serum protein electrophoresis with reflex   IgG, IgA, IgM   No orders of the defined types were placed in this encounter.    Follow-Up Instructions: Return in about 6 months (around 04/01/2022) for Rheumatoid arthritis, Osteoarthritis.   Pollyann Savoy, MD  Note - This record has been created using Animal nutritionist.  Chart creation errors have been sought, but may not always  have been located. Such creation errors do not reflect on  the standard of medical care.

## 2021-09-29 NOTE — Patient Instructions (Signed)
Standing Labs We placed an order today for your standing lab work.   Please have your standing labs drawn in 2 weeks x2 and then every 3 months.   If possible, please have your labs drawn 2 weeks prior to your appointment so that the provider can discuss your results at your appointment.  Please note that you may see your imaging and lab results in MyChart before we have reviewed them. We may be awaiting multiple results to interpret others before contacting you. Please allow our office up to 72 hours to thoroughly review all of the results before contacting the office for clarification of your results.  We have open lab daily: Monday through Thursday from 1:30-4:30 PM and Friday from 1:30-4:00 PM at the office of Dr. Pollyann Savoy, [facility].   Please be advised, all patients with office appointments requiring lab work will take precedent over walk-in lab work.  If possible, please come for your lab work on Monday and Friday afternoons, as you may experience shorter wait times. The office is located at [address], [address], [place], [place] [address] No appointment is necessary.   Labs are drawn by [facility]. Please bring your co-pay at the time of your lab draw.  You may receive a bill from [facility] for your lab work.  Please note if you are on Hydroxychloroquine and and an order has been placed for a Hydroxychloroquine level, you will need to have it drawn 4 hours or more after your last dose.  If you wish to have your labs drawn at another location, please call the office 24 hours in advance to send orders.  If you have any questions regarding directions or hours of operation,  please call [phone].   As a reminder, please drink plenty of water prior to coming for your lab work. Thanks!    Methotrexate Tablets What is this medication? METHOTREXATE (METH oh TREX ate) treats inflammatory conditions such as arthritis and psoriasis. It works by decreasing  inflammation, which can reduce pain and prevent long-term injury to the joints and skin. It may also be used to treat some types of cancer. It works by slowing down the growth of cancer cells. This medicine may be used for other purposes; ask your health care provider or pharmacist if you have questions. COMMON BRAND NAME(S): Rheumatrex, Trexall What should I tell my care team before I take this medication? They need to know if you have any of these conditions: Fluid in the stomach area or lungs If you often drink alcohol Infection or immune system problems Kidney disease or on hemodialysis Liver disease Low blood counts, like low white cell, platelet, or red cell counts Lung disease Radiation therapy Stomach ulcers Ulcerative colitis An unusual or allergic reaction to methotrexate, other medications, foods, dyes, or preservatives Pregnant or trying to get pregnant Breast-feeding How should I use this medication? Take this medication by mouth with a glass of water. Follow the directions on the prescription label. Take your medication at regular intervals. Do not take it more often than directed. Do not stop taking except on your care team's advice. Make sure you know why you are taking this medication and how often you should take it. If this medication is used for a condition that is not cancer, like arthritis or psoriasis, it should be taken weekly, NOT daily. Taking this medication more often than directed can cause serious side effects, even death. Talk to your care team about safe handling and disposal  of this medication. You may need to take special precautions. Talk to your care team about the use of this medication in children. While this medication may be prescribed for selected conditions, precautions do apply. Overdosage: If you think you have taken too much of this medicine contact a poison control center or emergency room at once. NOTE: This medicine is only for you. Do not share  this medicine with others. What if I miss a dose? If you miss a dose, talk with your care team. Do not take double or extra doses. What may interact with this medication? Do not take this medication with any of the following: Acitretin This medication may also interact with the following: Aspirin and aspirin-like medications including salicylates Azathioprine Certain antibiotics like penicillins, tetracycline, and chloramphenicol Certain medications that treat or prevent blood clots like warfarin, apixaban, dabigatran, and rivaroxaban Certain medications for stomach problems like esomeprazole, omeprazole, pantoprazole Cyclosporine Dapsone Diuretics Gold Hydroxychloroquine Live virus vaccines Medications for infection like acyclovir, adefovir, amphotericin B, bacitracin, cidofovir, foscarnet, ganciclovir, gentamicin, pentamidine, vancomycin Mercaptopurine NSAIDs, medications for pain and inflammation, like ibuprofen or naproxen Other cytotoxic agents Pamidronate Pemetrexed Penicillamine Phenylbutazone Phenytoin Probenecid Pyrimethamine Retinoids such as isotretinoin and tretinoin Steroid medications like prednisone or cortisone Sulfonamides like sulfasalazine and trimethoprim/sulfamethoxazole Theophylline Zoledronic acid This list may not describe all possible interactions. Give your health care provider a list of all the medicines, herbs, non-prescription drugs, or dietary supplements you use. Also tell them if you smoke, drink alcohol, or use illegal drugs. Some items may interact with your medicine. What should I watch for while using this medication? Avoid alcoholic drinks. This medication can make you more sensitive to the sun. Keep out of the sun. If you cannot avoid being in the sun, wear protective clothing and use sunscreen. Do not use sun lamps or tanning beds/booths. You may need blood work done while you are taking this medication. Call your care team for advice if  you get a fever, chills or sore throat, or other symptoms of a cold or flu. Do not treat yourself. This medication decreases your body's ability to fight infections. Try to avoid being around people who are sick. This medication may increase your risk to bruise or bleed. Call your care team if you notice any unusual bleeding. Be careful brushing or flossing your teeth or using a toothpick because you may get an infection or bleed more easily. If you have any dental work done, tell your dentist you are receiving this medication. Check with your care team if you get an attack of severe diarrhea, nausea and vomiting, or if you sweat a lot. The loss of too much body fluid can make it dangerous for you to take this medication. Talk to your care team about your risk of cancer. You may be more at risk for certain types of cancers if you take this medication. Do not become pregnant while taking this medication or for 6 months after stopping it. Women should inform their care team if they wish to become pregnant or think they might be pregnant. Men should not father a child while taking this medication and for 3 months after stopping it. There is potential for serious harm to an unborn child. Talk to your care team for more information. Do not breast-feed an infant while taking this medication or for 1 week after stopping it. This medication may make it more difficult to get pregnant or father a child. Talk to your care team if you  are concerned about your fertility. What side effects may I notice from receiving this medication? Side effects that you should report to your care team as soon as possible: Allergic reactions--skin rash, itching, hives, swelling of the face, lips, tongue, or throat Blood clot--pain, swelling, or warmth in the leg, shortness of breath, chest pain Dry cough, shortness of breath or trouble breathing Infection--fever, chills, cough, sore throat, wounds that don't heal, pain or trouble  when passing urine, general feeling of discomfort or being unwell Kidney injury--decrease in the amount of urine, swelling of the ankles, hands, or feet Liver injury--right upper belly pain, loss of appetite, nausea, light-colored stool, dark yellow or brown urine, yellowing of the skin or eyes, unusual weakness or fatigue Low red blood cell count--unusual weakness or fatigue, dizziness, headache, trouble breathing Redness, blistering, peeling, or loosening of the skin, including inside the mouth Seizures Unusual bruising or bleeding Side effects that usually do not require medical attention (report to your care team if they continue or are bothersome): Diarrhea Dizziness Hair loss Nausea Pain, redness, or swelling with sores inside the mouth or throat Vomiting This list may not describe all possible side effects. Call your doctor for medical advice about side effects. You may report side effects to FDA at 1-800-FDA-1088. Where should I keep my medication? Keep out of the reach of children and pets. Store at room temperature between 20 and 25 degrees C (68 and 77 degrees F). Protect from light. Get rid of any unused medication after the expiration date. Talk to your care team about how to dispose of unused medication. Special directions may apply. NOTE: This sheet is a summary. It may not cover all possible information. If you have questions about this medicine, talk to your doctor, pharmacist, or health care provider.  2023 Elsevier/Gold Standard (2007-04-01 00:00:00)   If you have signs or symptoms of an infection or start antibiotics: First, call your PCP for workup of your infection. Hold your medication through the infection, until you complete your antibiotics, and until symptoms resolve if you take the following: Injectable medication (Actemra, Benlysta, Cimzia, Cosentyx, Enbrel, Humira, Kevzara, Orencia, Remicade, Simponi, Stelara, Taltz, Tremfya) Methotrexate Leflunomide  (Arava) Mycophenolate (Cellcept) Christina Lewis, Olumiant, or Rinvoq   Vaccines You are taking a medication(s) that can suppress your immune system.  The following immunizations are recommended: Flu annually Covid-19  Td/Tdap (tetanus, diphtheria, pertussis) every 10 years Pneumonia (Prevnar 15 then Pneumovax 23 at least 1 year apart.  Alternatively, can take Prevnar 20 without needing additional dose) Shingrix: 2 doses from 4 weeks to 6 months apart  Please check with your PCP to make sure you are up to date.

## 2021-10-03 ENCOUNTER — Other Ambulatory Visit: Payer: Self-pay | Admitting: Rheumatology

## 2021-10-05 LAB — BASIC METABOLIC PANEL WITH GFR
BUN: 15 mg/dL (ref 7–25)
CO2: 25 mmol/L (ref 20–32)
Calcium: 10.3 mg/dL (ref 8.6–10.4)
Chloride: 104 mmol/L (ref 98–110)
Creat: 0.88 mg/dL (ref 0.50–1.05)
Glucose, Bld: 106 mg/dL — ABNORMAL HIGH (ref 65–99)
Potassium: 4.7 mmol/L (ref 3.5–5.3)
Sodium: 140 mmol/L (ref 135–146)
eGFR: 73 mL/min/{1.73_m2} (ref >=60)

## 2021-10-05 LAB — HEPATITIS C ANTIBODY: Hepatitis C Ab: NONREACTIVE

## 2021-10-05 LAB — IFE INTERPRETATION: Immunofix Electr Int: NOT DETECTED

## 2021-10-05 LAB — IGG, IGA, IGM
IgG (Immunoglobin G), Serum: 806 mg/dL (ref 600–1540)
IgM, Serum: 65 mg/dL (ref 50–300)
Immunoglobulin A: 89 mg/dL (ref 70–320)

## 2021-10-05 LAB — PROTEIN ELECTROPHORESIS, SERUM, WITH REFLEX
Albumin ELP: 4.6 g/dL (ref 3.8–4.8)
Alpha 1: 0.2 g/dL (ref 0.2–0.3)
Alpha 2: 0.5 g/dL (ref 0.5–0.9)
Beta 2: 0.2 g/dL (ref 0.2–0.5)
Beta Globulin: 0.4 g/dL (ref 0.4–0.6)
Gamma Globulin: 0.7 g/dL — ABNORMAL LOW (ref 0.8–1.7)
Total Protein: 6.7 g/dL (ref 6.1–8.1)

## 2021-10-05 LAB — HEPATITIS B CORE ANTIBODY, IGM: Hep B C IgM: NONREACTIVE

## 2021-10-05 LAB — QUANTIFERON-TB GOLD PLUS
Mitogen-NIL: 8.8 IU/mL
NIL: 0.02 IU/mL
QuantiFERON-TB Gold Plus: NEGATIVE
TB1-NIL: <0 IU/mL
TB2-NIL: <0 IU/mL

## 2021-10-05 LAB — HEPATITIS B SURFACE ANTIGEN: Hepatitis B Surface Ag: NONREACTIVE

## 2021-10-05 NOTE — Telephone Encounter (Signed)
Next Visit: 12/09/2021   Last Visit: 09/02/2021   Last Fill: 09/04/2021   Dx: Fibromyalgia   Current Dose per office note on 09/02/2021: baclofen 10 mg 1 tablet in the morning and 1 tablet at noon and tizanidine 4 mg at bedtime as needed for muscle spasms   Okay to refill Baclofen and Tizanidine?

## 2021-10-06 ENCOUNTER — Other Ambulatory Visit: Payer: Self-pay | Admitting: *Deleted

## 2021-10-06 DIAGNOSIS — Z79899 Other long term (current) drug therapy: Secondary | ICD-10-CM

## 2021-10-06 DIAGNOSIS — M5416 Radiculopathy, lumbar region: Secondary | ICD-10-CM | POA: Diagnosis not present

## 2021-10-06 MED ORDER — METHOTREXATE 2.5 MG PO TABS: ORAL_TABLET | ORAL | 0 refills | Status: DC

## 2021-10-06 MED ORDER — FOLIC ACID 1 MG PO TABS: 2.0000 mg | ORAL_TABLET | Freq: Every day | ORAL | 3 refills | Status: DC

## 2021-10-06 NOTE — Telephone Encounter (Signed)
Please advise patient that omeprazole can increase methotrexate toxicity.  We will continue to monitor labs closely.  She may stay on omeprazole.

## 2021-10-06 NOTE — Telephone Encounter (Signed)
Patient advised that omeprazole can increase methotrexate toxicity.  We will continue to monitor labs closely.  She may stay on omeprazole. Patient expressed understanding.

## 2021-10-06 NOTE — Telephone Encounter (Signed)
Attempted to contact the patient and left message for patient to call the office.  

## 2021-10-06 NOTE — Telephone Encounter (Signed)
Per procedure note on 09/29/2021:  her BMP is normal then we will start her on methotrexate 6 tablets p.o. weekly along with folic acid 2 mg p.o. daily.  Levels will be obtained in 2 weeks.  If labs are normal we will increase the dose of methotrexate to 8 tablets p.o. weekly.  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 Patient wants to know if she will be able to continue to take it.

## 2021-10-06 NOTE — Telephone Encounter (Signed)
Patient returned call to the office and left message requesting return call. Attempted to contact the patient and left message for patient to call the office.

## 2021-10-07 DIAGNOSIS — H2511 Age-related nuclear cataract, right eye: Secondary | ICD-10-CM | POA: Diagnosis not present

## 2021-10-07 DIAGNOSIS — M0560 Rheumatoid arthritis of unspecified site with involvement of other organs and systems: Secondary | ICD-10-CM | POA: Diagnosis not present

## 2021-10-07 DIAGNOSIS — E119 Type 2 diabetes mellitus without complications: Secondary | ICD-10-CM | POA: Diagnosis not present

## 2021-10-07 DIAGNOSIS — Z961 Presence of intraocular lens: Secondary | ICD-10-CM | POA: Diagnosis not present

## 2021-10-07 DIAGNOSIS — Z79899 Other long term (current) drug therapy: Secondary | ICD-10-CM | POA: Diagnosis not present

## 2021-10-07 LAB — HM DIABETES EYE EXAM

## 2021-10-15 ENCOUNTER — Ambulatory Visit: Payer: Medicare PPO | Admitting: Family Medicine

## 2021-10-15 ENCOUNTER — Encounter: Payer: Self-pay | Admitting: Family Medicine

## 2021-10-15 VITALS — BP 117/73 | HR 69 | Temp 98.6°F | Ht 64.0 in | Wt 174.0 lb

## 2021-10-15 DIAGNOSIS — E785 Hyperlipidemia, unspecified: Secondary | ICD-10-CM | POA: Diagnosis not present

## 2021-10-15 DIAGNOSIS — Z6829 Body mass index (BMI) 29.0-29.9, adult: Secondary | ICD-10-CM | POA: Insufficient documentation

## 2021-10-15 DIAGNOSIS — G43009 Migraine without aura, not intractable, without status migrainosus: Secondary | ICD-10-CM

## 2021-10-15 DIAGNOSIS — E1165 Type 2 diabetes mellitus with hyperglycemia: Secondary | ICD-10-CM | POA: Insufficient documentation

## 2021-10-15 DIAGNOSIS — E1169 Type 2 diabetes mellitus with other specified complication: Secondary | ICD-10-CM | POA: Diagnosis not present

## 2021-10-15 DIAGNOSIS — Z23 Encounter for immunization: Secondary | ICD-10-CM

## 2021-10-15 DIAGNOSIS — R1310 Dysphagia, unspecified: Secondary | ICD-10-CM | POA: Insufficient documentation

## 2021-10-15 DIAGNOSIS — M0600 Rheumatoid arthritis without rheumatoid factor, unspecified site: Secondary | ICD-10-CM

## 2021-10-15 DIAGNOSIS — E669 Obesity, unspecified: Secondary | ICD-10-CM | POA: Diagnosis not present

## 2021-10-15 DIAGNOSIS — E559 Vitamin D deficiency, unspecified: Secondary | ICD-10-CM | POA: Diagnosis not present

## 2021-10-15 DIAGNOSIS — E049 Nontoxic goiter, unspecified: Secondary | ICD-10-CM | POA: Insufficient documentation

## 2021-10-15 DIAGNOSIS — E282 Polycystic ovarian syndrome: Secondary | ICD-10-CM | POA: Insufficient documentation

## 2021-10-15 LAB — BAYER DCA HB A1C WAIVED: HB A1C (BAYER DCA - WAIVED): 6 % — ABNORMAL HIGH (ref 4.8–5.6)

## 2021-10-15 NOTE — Progress Notes (Signed)
Subjective:  Patient ID: Christina Lewis, female    DOB: 1955-06-08, 66 y.o.   MRN: [id number]  Patient Care Team: Baruch Gouty, FNP as PCP - General (Family Medicine) Okey Regal, [place] (Optometry)   Chief Complaint:  New Patient (Initial Visit)   HPI: Christina Lewis is a 66 y.o. female presenting on 10/15/2021 for New Patient (Initial Visit)   Patient presents today to establish care with new PCP.  She was formally followed by Dr. Manuella Ghazi in [place], [place].  States she last saw him over 5 months ago.  She is followed by endocrinology, rheumatology, and neurology on a regular basis.  She sees a rheumatologist for RA and fibromyalgia.  She was recently started on methotrexate in addition to her Plaquenil.  She has not had follow-up labs since initiation of these medications.  She has diet-controlled diabetes, states last saw endocrinology over 4 months ago.  She has documented hyperlipidemia but is not on any medications and does not wish to be as a cause myalgias.  She sees neurology for chronic migraines.  Neurologist is Dr. Volanda Napoleon in [place], [place].  She would like to change to a [facility] provider.  She also reports a history of PCOS and is status post total hysterectomy several years ago.  She has well-controlled acid reflux and is on vitamin D repletion therapy for vitamin D deficiency.  EHR has been reviewed in detail. Database is incomplete and medical records have been requested.     Relevant past medical, surgical, family, and social history reviewed and updated as indicated.  Allergies and medications reviewed and updated. Data reviewed: Chart in Epic.   Past Medical History:  Diagnosis Date   Arthritis    Asthma 2007   Depression    Diabetes mellitus    Fibromyalgia    GERD (gastroesophageal reflux disease)    Gout    Hematuria    History of kidney stones    History of tics    Ineffective esophageal motility    Interstitial cystitis 2016   Migraines     Osteoarthritis    PCOS (polycystic ovarian syndrome) 1993   Pneumonia 2019   PONV (postoperative nausea and vomiting)    Sleep apnea    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, took medication for a year   Thyroid nodule     Past Surgical History:  Procedure Laterality Date   ABDOMINAL HYSTERECTOMY  2000   APPENDECTOMY  1976   BREAST LUMPECTOMY  1989   lt-negative   CARDIOVASCULAR STRESS TEST  2000   CATARACT EXTRACTION W/PHACO Left 12/07/2019   Procedure: CATARACT EXTRACTION PHACO AND INTRAOCULAR LENS PLACEMENT ([place]);  Surgeon: Baruch Goldmann, MD;  Location: [facility];  Service: Ophthalmology;  Laterality: Left;  CDE: 5.55   CHOLECYSTECTOMY  1985   COLONOSCOPY     ESOPHAGEAL MANOMETRY N/A 08/24/2021   Procedure: ESOPHAGEAL MANOMETRY (EM);  Surgeon: Ronnette Juniper, MD;  Location: [facility];  Service: Gastroenterology;  Laterality: N/A;   HYSTERECTOMY ABDOMINAL WITH SALPINGECTOMY Bilateral 2000   KIDNEY STONE SURGERY Right 2014   with stent placement in OR   KNEE ARTHROSCOPY Right 08/22/2013   Procedure: ARTHROSCOPY RIGHT KNEE FOR INFECTION LAVAGE AND DRAINAGE;  Surgeon: Yvette Rack., MD;  Location: [place];  Service: Orthopedics;  Laterality: Right;   KNEE ARTHROSCOPY WITH PATELLA RECONSTRUCTION Right 08/06/2013   Procedure: RIGHT KNEE ARTHROSCOPY WITH MENISCECTOMY MEDIAL, ARTHROSCOPY KNEE WITH DEBRIDEMENT/SHAVING (CHONDROPLASTY) ;  Surgeon: Yvette Rack., MD;  Location: [place];  Service: Orthopedics;  Laterality: Right;   LUMBAR LAMINECTOMY/DECOMPRESSION MICRODISCECTOMY Right 09/11/2019   Procedure: Right Lumbar Three-Four Microdiscectomy;  Surgeon: Erline Levine, MD;  Location: [place];  Service: Neurosurgery;  Laterality: Right;  Right Lumbar Three-Four Microdiscectomy   NASAL SINUS SURGERY     x4   OOPHORECTOMY     SHOULDER SURGERY  2011   right shoulder   THYROID SURGERY  1994   TONSILLECTOMY Bilateral 1974    Social History    Socioeconomic History   Marital status: Married    Spouse name: Not on file   Number of children: Not on file   Years of education: Not on file   Highest education level: Not on file  Occupational History   Not on file  Tobacco Use   Smoking status: Never    Passive exposure: Current   Smokeless tobacco: Never  Vaping Use   Vaping Use: Never used  Substance and Sexual Activity   Alcohol use: No   Drug use: Never   Sexual activity: Yes  Other Topics Concern   Not on file  Social History Narrative   Not on file   Social Determinants of Health   Financial Resource Strain: Not on file  Food Insecurity: Not on file  Transportation Needs: Not on file  Physical Activity: Not on file  Stress: Not on file  Social Connections: Not on file  Intimate Partner Violence: Not on file    Outpatient Encounter Medications as of 10/15/2021  Medication Sig   aspirin EC 81 MG tablet Take 81 mg by mouth daily. Swallow whole.   baclofen (LIORESAL) 10 MG tablet TAKE 1 TABLET EVERY MORNING AND 1 TABLET AT NOON.   butalbital-acetaminophen-caffeine (FIORICET, ESGIC) 50-325-40 MG tablet Take 1-2 tablets by mouth 2 (two) times daily as needed for migraine.    cholecalciferol (VITAMIN D) 25 MCG (1000 UNIT) tablet Take 1,000 Units by mouth daily.   diclofenac Sodium (VOLTAREN) 1 % GEL APPLY 2 TO 4 GRAMS TO AFFECTED JOINTS UP TO 4 TIMES DAILY AS NEEDED. (Patient taking differently: Apply 2 g topically 4 (four) times daily as needed (joint pain).)   doxepin (SINEQUAN) 50 MG capsule 1 capsule at bedtime   DULoxetine (CYMBALTA) 30 MG capsule Take 30 mg by mouth daily.   DULoxetine (CYMBALTA) 60 MG capsule Take 120 mg by mouth daily.   fluticasone (FLONASE ALLERGY RELIEF) 50 MCG/ACT nasal spray    folic acid (FOLVITE) 1 MG tablet Take 2 tablets (2 mg total) by mouth daily.   gabapentin (NEURONTIN) 300 MG capsule Take 600 mg by mouth 2 (two) times daily.   hydroxychloroquine (PLAQUENIL) 200 MG tablet  TAKE 1 TABLET TWICE DAILY MONDAY THRU FRIDAY FOR RHEUMATOID ARTHRITIS.   ipratropium (ATROVENT) 0.06 % nasal spray Place into both nostrils.   levocetirizine (XYZAL) 5 MG tablet Take 5 mg by mouth at bedtime.   MAGNESIUM PO Take 500 mg by mouth daily.    methotrexate (RHEUMATREX) 2.5 MG tablet Take 6 tabs po weekly x 2 weeks. If labs are stable increase to 8 tabs po weekly.  Caution:Chemotherapy. Protect from light.   Multiple Vitamin (MULTIVITAMIN WITH MINERALS) TABS tablet Take 1 tablet by mouth daily.   omeprazole (PRILOSEC) 40 MG capsule    ondansetron (ZOFRAN-ODT) 8 MG disintegrating tablet Take 8 mg by mouth every 8 (eight) hours as needed for nausea.    tiZANidine (ZANAFLEX) 4 MG tablet TAKE 1  TABLET AT BEDTIME AS NEEDED   TURMERIC-GINGER PO Take 1 tablet by mouth daily.   [DISCONTINUED] doxepin (SINEQUAN) 25 MG capsule Take 25-50 mg by mouth at bedtime.   [DISCONTINUED] Atogepant (QULIPTA) 60 MG TABS Take 1 tablet by mouth daily. (Patient not taking: Reported on 04/29/2021)   [DISCONTINUED] traMADol (ULTRAM) 50 MG tablet Take 50 mg by mouth 3 (three) times daily as needed.   No facility-administered encounter medications on file as of 10/15/2021.    Allergies  Allergen Reactions   Sulfa Antibiotics Anaphylaxis   Codeine Itching   Erythromycin Nausea And Vomiting   Metronidazole     Diarrhea    Morphine And Related Itching    Review of Systems  Constitutional:  Positive for activity change, appetite change and fatigue. Negative for chills, diaphoresis, fever and unexpected weight change.  HENT: Negative.    Eyes: Negative.  Negative for photophobia and visual disturbance.  Respiratory:  Negative for cough, chest tightness and shortness of breath.   Cardiovascular:  Negative for chest pain, palpitations and leg swelling.  Gastrointestinal:  Negative for abdominal pain, blood in stool, constipation, diarrhea, nausea and vomiting.  Endocrine: Negative.  Negative for polydipsia,  polyphagia and polyuria.  Genitourinary:  Negative for decreased urine volume, difficulty urinating, dysuria, frequency and urgency.  Musculoskeletal:  Positive for arthralgias, back pain and myalgias.  Skin: Negative.   Allergic/Immunologic: Negative.   Neurological:  Positive for headaches. Negative for dizziness, tremors, seizures, syncope, facial asymmetry, speech difficulty, weakness, light-headedness and numbness.  Hematological: Negative.   Psychiatric/Behavioral:  Positive for agitation, decreased concentration and sleep disturbance. Negative for behavioral problems, confusion, dysphoric mood, hallucinations, self-injury and suicidal ideas. The patient is nervous/anxious. The patient is not hyperactive.   All other systems reviewed and are negative.       Objective:  BP 117/73   Pulse 69   Temp 98.6 F (37 C)   Ht 5' 4"  (1.626 m)   Wt 174 lb (78.9 kg)   SpO2 95%   BMI 29.87 kg/m    Wt Readings from Last 3 Encounters:  10/15/21 174 lb (78.9 kg)  09/02/21 174 lb 6.4 oz (79.1 kg)  04/29/21 178 lb (80.7 kg)    Physical Exam Vitals and nursing note reviewed.  Constitutional:      General: She is not in acute distress.    Appearance: Normal appearance. She is well-developed and overweight. She is not ill-appearing, toxic-appearing or diaphoretic.  HENT:     Head: Normocephalic and atraumatic.     Jaw: There is normal jaw occlusion.     Right Ear: Hearing normal.     Left Ear: Hearing normal.     Nose: Nose normal.     Mouth/Throat:     Lips: Pink.     Mouth: Mucous membranes are moist.     Pharynx: Uvula midline.  Eyes:     General: Lids are normal.     Conjunctiva/sclera: Conjunctivae normal.     Pupils: Pupils are equal, round, and reactive to light.  Neck:     Thyroid: No thyroid mass, thyromegaly or thyroid tenderness.     Vascular: No carotid bruit or JVD.     Trachea: Trachea and phonation normal.  Cardiovascular:     Rate and Rhythm: Normal rate and  regular rhythm.     Chest Wall: PMI is not displaced.     Pulses: Normal pulses.     Heart sounds: Normal heart sounds. No murmur heard.  No friction rub. No gallop.  Pulmonary:     Effort: Pulmonary effort is normal.     Breath sounds: Normal breath sounds.  Abdominal:     General: There is no abdominal bruit.     Palpations: Abdomen is soft. There is no hepatomegaly or splenomegaly.  Musculoskeletal:     Cervical back: Normal range of motion and neck supple.     Right lower leg: No edema.     Left lower leg: No edema.  Lymphadenopathy:     Cervical: No cervical adenopathy.  Skin:    General: Skin is warm and dry.     Capillary Refill: Capillary refill takes less than 2 seconds.     Coloration: Skin is not cyanotic, jaundiced or pale.     Findings: No rash.  Neurological:     General: No focal deficit present.     Mental Status: She is alert and oriented to person, place, and time.     Sensory: Sensation is intact.     Motor: Motor function is intact.     Coordination: Coordination is intact.     Gait: Gait abnormal (slow, antalgic).     Deep Tendon Reflexes: Reflexes are normal and symmetric.  Psychiatric:        Attention and Perception: Attention and perception normal.        Mood and Affect: Mood and affect normal.        Speech: Speech normal.        Behavior: Behavior normal. Behavior is cooperative.        Thought Content: Thought content normal.        Cognition and Memory: Cognition and memory normal.        Judgment: Judgment normal.     Results for orders placed or performed in visit on 70/17/79  BASIC METABOLIC PANEL WITH GFR  Result Value Ref Range   Glucose, Bld 106 (H) 65 - 99 mg/dL   BUN 15 7 - 25 mg/dL   Creat 0.88 0.50 - 1.05 mg/dL   eGFR 73 > OR = 60 mL/min/1.33m   BUN/Creatinine Ratio SEE NOTE: 6 - 22 (calc)   Sodium 140 135 - 146 mmol/L   Potassium 4.7 3.5 - 5.3 mmol/L   Chloride 104 98 - 110 mmol/L   CO2 25 20 - 32 mmol/L   Calcium 10.3  8.6 - 10.4 mg/dL  Hepatitis B core antibody, IgM  Result Value Ref Range   Hep B C IgM NON-REACTIVE NON-REACTIVE  Hepatitis B surface antigen  Result Value Ref Range   Hepatitis B Surface Ag NON-REACTIVE NON-REACTIVE  Hepatitis C antibody  Result Value Ref Range   Hepatitis C Ab NON-REACTIVE NON-REACTIVE  QuantiFERON-TB Gold Plus  Result Value Ref Range   QuantiFERON-TB Gold Plus NEGATIVE NEGATIVE   NIL 0.02 IU/mL   Mitogen-NIL 8.80 IU/mL   TB1-NIL <0.00 IU/mL   TB2-NIL <0.00 IU/mL  Serum protein electrophoresis with reflex  Result Value Ref Range   Total Protein 6.7 6.1 - 8.1 g/dL   Albumin ELP 4.6 3.8 - 4.8 g/dL   Alpha 1 0.2 0.2 - 0.3 g/dL   Alpha 2 0.5 0.5 - 0.9 g/dL   Beta Globulin 0.4 0.4 - 0.6 g/dL   Beta 2 0.2 0.2 - 0.5 g/dL   Gamma Globulin 0.7 (L) 0.8 - 1.7 g/dL   Abnormal Protein Band1  NONE DETECTED g/dL   SPE Interp.    IgG, IgA, IgM  Result Value Ref Range  Immunoglobulin A 89 70 - 320 mg/dL   IgG (Immunoglobin G), Serum 806 600 - 1,540 mg/dL   IgM, Serum 65 50 - 300 mg/dL  IFE Interpretation  Result Value Ref Range   Immunofix Electr Int NO MONOCLONAL PROTEIN DETECTED        Pertinent labs & imaging results that were available during my care of the patient were reviewed by me and considered in my medical decision making.  Assessment & Plan:  Kery was seen today for new patient (initial visit).  Diagnoses and all orders for this visit:  Rheumatoid arthritis with negative rheumatoid factor, involving unspecified site [facility]) Followed by rheumatology on a regular basis.   Diabetes mellitus type 2 in obese [facility]) Followed by endocrinology on a regular basis. Last labs over 4 months ago. Will obtain baseline labs today.  -     CBC with Differential/Platelet -     CMP14+EGFR -     Bayer DCA Hb A1c Waived -     Lipid panel -     Thyroid Panel With TSH -     Microalbumin / creatinine urine ratio  Hyperlipidemia associated with type 2 diabetes mellitus  ([place]) Not on statin therapy and states she does not wish to be as they cause myalgias. Will obtain labs and discuss treatment options once resulted.  -     CMP14+EGFR -     Lipid panel  Vitamin D deficiency Labs pending. Continue repletion therapy. If indicated, will change repletion dosage. Eat foods rich in Vit D including milk, orange juice, yogurt with vitamin D added, salmon or mackerel, canned tuna fish, cereals with vitamin D added, and cod liver oil. Get out in the sun but make sure to wear at least SPF 30 sunscreen.  -     VITAMIN D 25 Hydroxy (Vit-D Deficiency, Fractures)  Migraine without aura and without status migrainosus, not intractable Referral to new neurologist placed.   BMI 29.0-29.9,adult Diet and exercise encouraged.   Need for zoster vaccination -     Zoster Recombinant (Shingrix )     Continue all other maintenance medications.  Follow up plan: Return in about 3 months (around 01/15/2022), or if symptoms worsen or fail to improve.   Continue healthy lifestyle choices, including diet (rich in fruits, vegetables, and lean proteins, and low in salt and simple carbohydrates) and exercise (at least 30 minutes of moderate physical activity daily).  Educational handout given for health maintenance   The above assessment and management plan was discussed with the patient. The patient verbalized understanding of and has agreed to the management plan. Patient is aware to call the clinic if they develop any new symptoms or if symptoms persist or worsen. Patient is aware when to return to the clinic for a follow-up visit. Patient educated on when it is appropriate to go to the emergency department.   Monia Pouch, FNP-C [facility] [phone]

## 2021-10-16 LAB — LIPID PANEL
Chol/HDL Ratio: 5.2 ratio — ABNORMAL HIGH (ref 0.0–4.4)
Cholesterol, Total: 263 mg/dL — ABNORMAL HIGH (ref 100–199)
HDL: 51 mg/dL (ref >39)
LDL Chol Calc (NIH): 153 mg/dL — ABNORMAL HIGH (ref 0–99)
Triglycerides: 317 mg/dL — ABNORMAL HIGH (ref 0–149)
VLDL Cholesterol Cal: 59 mg/dL — ABNORMAL HIGH (ref 5–40)

## 2021-10-16 LAB — CBC WITH DIFFERENTIAL/PLATELET
Basophils Absolute: 0.1 10*3/uL (ref 0.0–0.2)
Basos: 1 %
EOS (ABSOLUTE): 0.3 10*3/uL (ref 0.0–0.4)
Eos: 2 %
Hematocrit: 38.2 % (ref 34.0–46.6)
Hemoglobin: 13.2 g/dL (ref 11.1–15.9)
Immature Grans (Abs): 0.3 10*3/uL — ABNORMAL HIGH (ref 0.0–0.1)
Immature Granulocytes: 2 %
Lymphocytes Absolute: 4.1 10*3/uL — ABNORMAL HIGH (ref 0.7–3.1)
Lymphs: 34 %
MCH: 31.7 pg (ref 26.6–33.0)
MCHC: 34.6 g/dL (ref 31.5–35.7)
MCV: 92 fL (ref 79–97)
Monocytes Absolute: 1 10*3/uL — ABNORMAL HIGH (ref 0.1–0.9)
Monocytes: 8 %
Neutrophils Absolute: 6.5 10*3/uL (ref 1.4–7.0)
Neutrophils: 53 %
Platelets: 389 10*3/uL (ref 150–450)
RBC: 4.16 x10E6/uL (ref 3.77–5.28)
RDW: 13.2 % (ref 11.7–15.4)
WBC: 12.3 10*3/uL — ABNORMAL HIGH (ref 3.4–10.8)

## 2021-10-16 LAB — CMP14+EGFR
ALT: 23 IU/L (ref 0–32)
AST: 19 IU/L (ref 0–40)
Albumin/Globulin Ratio: 2 (ref 1.2–2.2)
Albumin: 4.5 g/dL (ref 3.9–4.9)
Alkaline Phosphatase: 82 IU/L (ref 44–121)
BUN/Creatinine Ratio: 27 (ref 12–28)
BUN: 21 mg/dL (ref 8–27)
Bilirubin Total: <0.2 mg/dL (ref 0.0–1.2)
CO2: 20 mmol/L (ref 20–29)
Calcium: 9.8 mg/dL (ref 8.7–10.3)
Chloride: 103 mmol/L (ref 96–106)
Creatinine, Ser: 0.77 mg/dL (ref 0.57–1.00)
Globulin, Total: 2.3 g/dL (ref 1.5–4.5)
Glucose: 101 mg/dL — ABNORMAL HIGH (ref 70–99)
Potassium: 4.6 mmol/L (ref 3.5–5.2)
Sodium: 140 mmol/L (ref 134–144)
Total Protein: 6.8 g/dL (ref 6.0–8.5)
eGFR: 86 mL/min/{1.73_m2} (ref >59)

## 2021-10-16 LAB — THYROID PANEL WITH TSH
Free Thyroxine Index: 1 — ABNORMAL LOW (ref 1.2–4.9)
T3 Uptake Ratio: 24 % (ref 24–39)
T4, Total: 4.1 ug/dL — ABNORMAL LOW (ref 4.5–12.0)
TSH: 1.22 u[IU]/mL (ref 0.450–4.500)

## 2021-10-16 LAB — VITAMIN D 25 HYDROXY (VIT D DEFICIENCY, FRACTURES): Vit D, 25-Hydroxy: 81.3 ng/mL (ref 30.0–100.0)

## 2021-10-19 ENCOUNTER — Telehealth: Payer: Self-pay | Admitting: Rheumatology

## 2021-10-19 DIAGNOSIS — Z79899 Other long term (current) drug therapy: Secondary | ICD-10-CM | POA: Diagnosis not present

## 2021-10-19 NOTE — Telephone Encounter (Signed)
Patient called the office requesting lab orders be sent to [facility] in [place]. Patient is going today. Patient requests a call once they have been sent.

## 2021-10-19 NOTE — Telephone Encounter (Signed)
Lab Orders released.  

## 2021-10-20 LAB — COMPLETE METABOLIC PANEL WITH GFR
AG Ratio: 2 (calc) (ref 1.0–2.5)
ALT: 21 U/L (ref 6–29)
AST: 15 U/L (ref 10–35)
Albumin: 4.4 g/dL (ref 3.6–5.1)
Alkaline phosphatase (APISO): 66 U/L (ref 37–153)
BUN: 24 mg/dL (ref 7–25)
CO2: 24 mmol/L (ref 20–32)
Calcium: 9.7 mg/dL (ref 8.6–10.4)
Chloride: 105 mmol/L (ref 98–110)
Creat: 0.9 mg/dL (ref 0.50–1.05)
Globulin: 2.2 g/dL (calc) (ref 1.9–3.7)
Glucose, Bld: 111 mg/dL — ABNORMAL HIGH (ref 65–99)
Potassium: 4.3 mmol/L (ref 3.5–5.3)
Sodium: 139 mmol/L (ref 135–146)
Total Bilirubin: 0.2 mg/dL (ref 0.2–1.2)
Total Protein: 6.6 g/dL (ref 6.1–8.1)
eGFR: 71 mL/min/{1.73_m2} (ref >=60)

## 2021-10-20 LAB — CBC WITH DIFFERENTIAL/PLATELET
Absolute Monocytes: 768 cells/uL (ref 200–950)
Basophils Absolute: 113 cells/uL (ref 0–200)
Basophils Relative: 1 %
Eosinophils Absolute: 429 cells/uL (ref 15–500)
Eosinophils Relative: 3.8 %
HCT: 37.3 % (ref 35.0–45.0)
Hemoglobin: 12.6 g/dL (ref 11.7–15.5)
Lymphs Abs: 3017 cells/uL (ref 850–3900)
MCH: 31 pg (ref 27.0–33.0)
MCHC: 33.8 g/dL (ref 32.0–36.0)
MCV: 91.6 fL (ref 80.0–100.0)
MPV: 9.7 fL (ref 7.5–12.5)
Monocytes Relative: 6.8 %
Neutro Abs: 6972 cells/uL (ref 1500–7800)
Neutrophils Relative %: 61.7 %
Platelets: 341 10*3/uL (ref 140–400)
RBC: 4.07 10*6/uL (ref 3.80–5.10)
RDW: 13.1 % (ref 11.0–15.0)
Total Lymphocyte: 26.7 %
WBC: 11.3 10*3/uL — ABNORMAL HIGH (ref 3.8–10.8)

## 2021-10-21 ENCOUNTER — Other Ambulatory Visit: Payer: Self-pay | Admitting: Gastroenterology

## 2021-10-21 DIAGNOSIS — R1011 Right upper quadrant pain: Secondary | ICD-10-CM | POA: Diagnosis not present

## 2021-10-21 DIAGNOSIS — K648 Other hemorrhoids: Secondary | ICD-10-CM | POA: Diagnosis not present

## 2021-10-21 DIAGNOSIS — R1319 Other dysphagia: Secondary | ICD-10-CM | POA: Diagnosis not present

## 2021-10-23 ENCOUNTER — Other Ambulatory Visit: Payer: Medicare PPO

## 2021-10-27 ENCOUNTER — Other Ambulatory Visit: Payer: Self-pay | Admitting: Gastroenterology

## 2021-10-28 ENCOUNTER — Ambulatory Visit
Admission: RE | Admit: 2021-10-28 | Discharge: 2021-10-28 | Disposition: A | Discharge status: Home / Self Care | Payer: Medicare PPO | Source: Ambulatory Visit | Attending: Gastroenterology | Admitting: Gastroenterology

## 2021-10-28 DIAGNOSIS — R1011 Right upper quadrant pain: Secondary | ICD-10-CM

## 2021-10-28 DIAGNOSIS — Z9049 Acquired absence of other specified parts of digestive tract: Secondary | ICD-10-CM | POA: Diagnosis not present

## 2021-11-02 NOTE — Anesthesia Preprocedure Evaluation (Signed)
Anesthesia Evaluation  Patient identified by MRN, date of birth, ID band Patient awake    Reviewed: Allergy & Precautions, NPO status , Patient's Chart, lab work & pertinent test results  History of Anesthesia Complications (+) PONV and history of anesthetic complications  Airway Mallampati: II  TM Distance: >3 FB Neck ROM: Full    Dental  (+) Missing, Chipped,    Pulmonary sleep apnea ,    Pulmonary exam normal        Cardiovascular negative cardio ROS Normal cardiovascular exam     Neuro/Psych  Headaches, Anxiety Depression    GI/Hepatic Neg liver ROS, GERD  Medicated,dysphagia, abnormal esophageal manometry   Endo/Other  diabetes, Type 2  Renal/GU negative Renal ROS  negative genitourinary   Musculoskeletal  (+) Arthritis , Rheumatoid disorders,  Fibromyalgia -  Abdominal   Peds  Hematology negative hematology ROS (+)   Anesthesia Other Findings Day of surgery medications reviewed with patient.  Reproductive/Obstetrics negative OB ROS                            Anesthesia Physical Anesthesia Plan  ASA: 2  Anesthesia Plan: MAC   Post-op Pain Management: Minimal or no pain anticipated   Induction:   PONV Risk Score and Plan: 3 and Treatment may vary due to age or medical condition and Propofol infusion  Airway Management Planned: Natural Airway and Nasal Cannula  Additional Equipment: None  Intra-op Plan:   Post-operative Plan:   Informed Consent: I have reviewed the patients History and Physical, chart, labs and discussed the procedure including the risks, benefits and alternatives for the proposed anesthesia with the patient or authorized representative who has indicated his/her understanding and acceptance.       Plan Discussed with: CRNA  Anesthesia Plan Comments:        Anesthesia Quick Evaluation

## 2021-11-02 NOTE — H&P (Signed)
History of Present Illness  General:  Patient is a 66 year old female who presents for evaluation of dysphagia. Patient is established with Dr. Marca Ancona. EGD w/ dil 08/29/2019 - Chronic inactive gatritis negative for H. pylori or EOE. Widely patent Schatzki ring, dilated. 5cm hiatal hernia. 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 Nonspecific esophageal dsymotility with poor primary wave contraction and disorganized tertiary contractions. Small hiatal hernia.  Esophageal manometry 08/24/2021 - Elevated resting LES pressure, non-elevated relaxation LES pressure. Ineffective peristalsis on half of swallows. Ineffective esophageal motility. Dr. Marca Ancona recommended PPI once a day indefinitely. If no improvement on PPI, recommended baclofen 5mg  2-3 times a day. Patient states increased PPI dose has helped but still having frequent trouble swallowing. She has difficulty swallowing pills and feels they get stuck in her chest, eventually will go down, usually better if she drinks milk. She also has trouble with solid food getting stuck in her throat and causing coughing and choking. Sometimes has pain/pressure in her epigastrium after eating. States she lays in bed and feels she can't swallow her saliva, which is anxiety-inducing.  Symptoms are occurring 4-5 times a week. She did notice some improvement following EGD w/ dilation. Reports she is already taking Baclofen twice daily as well as another muscle relaxer for fibromyalgia pain but these have not improved her swallowing. Denies nausea, vomiting, fever, chills. She has noticed intermittent RUQ pain lately. Has history of cholecystectomy many years ago and wonders if this pain is a result of scar tissue. Tender to touch in this area. No clear association with eating. Sometimes feels worse after a bowel  movement. Occasionally sees blood on toilet paper. Known internal hemorrhoids but has not used suppositories. Last colonoscopy 2 years ago. Reports a few pounds of weight loss over the summer which she attributes to increased activity and dietary changes.  Denies family history of GI malignancy or disease. Denies MI/stroke history. Takes 81mg  aspirin but no other blood thinners. Diet-controlled type 2 diabetes. Reports she had blood work last week with new PCP and liver enzymes were normal.   Current Medications  Taking   Butalbital-APAP   Calcium 200 MG Tablet 1 tablet with meals Orally Once a day  Cymbalta(DULoxetine HCl) 60 MG Capsule Delayed Release Particles 1 capsule Orally Once a day  Multi For Her - Capsule as directed Orally   One Touch Ultra test strips test strips as directed as directed  Plaquenil(Hydroxychloroquine Sulfate) 200 MG Tablet as directed Orally   PriLOSEC OTC(Omeprazole Magnesium) 20 MG Tablet Delayed Release 1 tablet 30 minutes before morning meal Orally Once a day, Notes: 40 MG  Promethazine HCl 25 MG Tablet 1 tablet Orally four times a day as needed for vomiting  Relpax(Eletriptan Hydrobromide) 40 MG Tablet 1 tablet Orally Once a day  Viibryd 40 MG Tablets after taking 10 MG daily x 1st wk, then 20 MG daily x 2nd wk, patient should then take 40 MG Orally Once daily  Vitamin D 50 MCG (2000 UT) Tablet 1 tablet Orally Once a day  Voltaren(Diclofenac Sodium (Ophth)) 1 % Gel as directed Externally   Zanaflex(tiZANidine HCl) 4 MG Capsule 1 capsule as needed Orally BID  ZyrTEC Allergy(Cetirizine HCl) 10 MG Tablet 1 tablet Orally Once a day  Methotrexate 2.5 MG Tablet as directed Orally   Not-Taking   Lidoderm(Lidocaine) 5 % Patch 1 patch remove after 12 hours Externally Once a day  Singulair(Montelukast  Sodium) 10 MG Tablet 1 tablets in the evening Orally QPM  Medication List reviewed and reconciled with the patient   Past Medical History  Fibromyalgia.   Chronic  Migraines.   RA.   Kidney Stones.    Surgical History  tonsillectomy   appendectomy   hysterectomy   miniscus repair in Rt Knee   allbladder   Colonoscopy 08/2019  Endoscopy 08/2019   Family History  Father: deceased  Mother: deceased, Polyps  Sister 1: alive, Polyps  No Family History of Colon Cancer, or Liver Disease.   Social History  General:  Tobacco use  cigarettes: Never smoked Tobacco history last updated 10/21/2021 no Alcohol.  no Recreational drug use.    Allergies  Sulfa Antibiotics   Hospitalization/Major Diagnostic Procedure  Hospitalized due to celulitis in right big toe and leg 02/2021   Review of Systems  GI PROCEDURE:  Pacemaker/ AICD no. Artificial heart valves no. MI/heart attack no. Abnormal heart rhythm no. Angina no. CVA no. Hypertension no. Hypotension no. Asthma, COPD no. Sleep apnea YES. Seizure disorders no. Artificial joints no. Diabetes YES, type II. Significant headaches YES. Vertigo no. Depression/anxiety YES. Abnormal bleeding no. Kidney Disease no. Liver disease no. Blood transfusion no.     Vital Signs  Wt 171.4, Wt change 8.4 lbs, Ht 64, BMI 29.42, Temp 97.9, Pulse sitting 73, BP sitting 132/80.   Examination  Gastroenterology Exam: GENERAL APPEARANCE: Well developed, well nourished, no active distress.  RESPIRATORY Breath sounds normal. Respiration even and unlabored.  CARDIOVASCULAR RRR w/o murmurs or gallops. No peripheral edema.  ABDOMEN Soft, tender to palpation of RUQ. No masses palpated. Liver and spleen not palpated, normal. Bowel sounds normal, Abdomen not distended.  EXTREMITIES: No edema, pulses intact.  SKIN Warm and dry, good turgor without rashes.  PSYCHIATRIC Alert and oriented x3, mood and affect appear normal..  NEURO: alert, normal strength and tone.     Assessments     1. Internal hemorrhoids - K64.8 (Primary)   2. RUQ abdominal pain - R10.11   3. Esophageal dysphagia - R13.19  Treatment  1. Internal  hemorrhoids  Start Hydrocortisone Acetate Suppository, 25 MG, 1 suppository, Rectal, Once a day at bedtime, 10 days, 10, Refills 1 Clinical Notes: Occasionally sees blood on toilet paper. Known internal hemorrhoids but has not used suppositories. Last colonoscopy 2 years ago. Will send suppositories and if no improvement consider flex sig.    2. RUQ abdominal pain  IMAGING: [place] Right Upper Quadrant    Earlene Plater 10/21/2021 01:33:08 PM > order faxed to [facility].   Clinical Notes:  Patient has noticed intermittent RUQ pain lately. Has history of cholecystectomy many years ago and wonders if this pain is a result of scar tissue. Tender to touch in this area. No clear association with eating. Sometimes feels worse after a bowel movement. Will order RUQ [place] to evaluate. If no findings and persistent consider CT A/P. Marland Kitchen  Referral To: Reason:RUQ ultrasound     3. Esophageal dysphagia  Clinical Notes:  Patient continues to have frequent trouble swallowing despite increased PPI. Reports she is already taking Baclofen/other muscle relaxers for fibromyalgia, no improvement in swallowing. Symptoms occurring 4-5 times a week. She did notice some improvement following EGD w/ dilation.  Recommend EGD with dilation ?botox injection.

## 2021-11-03 ENCOUNTER — Encounter (HOSPITAL_COMMUNITY): Payer: Self-pay | Admitting: Gastroenterology

## 2021-11-03 ENCOUNTER — Ambulatory Visit (HOSPITAL_BASED_OUTPATIENT_CLINIC_OR_DEPARTMENT_OTHER): Payer: Medicare PPO | Admitting: Anesthesiology

## 2021-11-03 ENCOUNTER — Encounter (HOSPITAL_COMMUNITY)
Admission: RE | Disposition: A | Discharge status: Home / Self Care | Payer: Self-pay | Source: Home / Self Care | Attending: Gastroenterology

## 2021-11-03 ENCOUNTER — Ambulatory Visit (HOSPITAL_COMMUNITY): Payer: Medicare PPO | Admitting: Anesthesiology

## 2021-11-03 ENCOUNTER — Other Ambulatory Visit: Payer: Self-pay

## 2021-11-03 ENCOUNTER — Ambulatory Visit (HOSPITAL_COMMUNITY)
Admission: RE | Admit: 2021-11-03 | Discharge: 2021-11-03 | Disposition: A | Discharge status: Home / Self Care | Payer: Medicare PPO | Attending: Gastroenterology | Admitting: Gastroenterology

## 2021-11-03 DIAGNOSIS — G4730 Sleep apnea, unspecified: Secondary | ICD-10-CM

## 2021-11-03 DIAGNOSIS — K3189 Other diseases of stomach and duodenum: Secondary | ICD-10-CM | POA: Insufficient documentation

## 2021-11-03 DIAGNOSIS — K449 Diaphragmatic hernia without obstruction or gangrene: Secondary | ICD-10-CM | POA: Diagnosis not present

## 2021-11-03 DIAGNOSIS — K222 Esophageal obstruction: Secondary | ICD-10-CM | POA: Insufficient documentation

## 2021-11-03 DIAGNOSIS — R1314 Dysphagia, pharyngoesophageal phase: Secondary | ICD-10-CM | POA: Diagnosis not present

## 2021-11-03 DIAGNOSIS — M797 Fibromyalgia: Secondary | ICD-10-CM | POA: Diagnosis not present

## 2021-11-03 DIAGNOSIS — R1011 Right upper quadrant pain: Secondary | ICD-10-CM | POA: Insufficient documentation

## 2021-11-03 DIAGNOSIS — F418 Other specified anxiety disorders: Secondary | ICD-10-CM | POA: Diagnosis not present

## 2021-11-03 DIAGNOSIS — Z9049 Acquired absence of other specified parts of digestive tract: Secondary | ICD-10-CM | POA: Diagnosis not present

## 2021-11-03 DIAGNOSIS — Z79899 Other long term (current) drug therapy: Secondary | ICD-10-CM | POA: Diagnosis not present

## 2021-11-03 DIAGNOSIS — E119 Type 2 diabetes mellitus without complications: Secondary | ICD-10-CM | POA: Insufficient documentation

## 2021-11-03 DIAGNOSIS — M069 Rheumatoid arthritis, unspecified: Secondary | ICD-10-CM | POA: Insufficient documentation

## 2021-11-03 DIAGNOSIS — R1310 Dysphagia, unspecified: Secondary | ICD-10-CM | POA: Diagnosis not present

## 2021-11-03 DIAGNOSIS — K648 Other hemorrhoids: Secondary | ICD-10-CM | POA: Insufficient documentation

## 2021-11-03 DIAGNOSIS — K219 Gastro-esophageal reflux disease without esophagitis: Secondary | ICD-10-CM | POA: Diagnosis not present

## 2021-11-03 HISTORY — PX: BOTOX INJECTION: SHX5754

## 2021-11-03 HISTORY — PX: BALLOON DILATION: SHX5330

## 2021-11-03 HISTORY — PX: ESOPHAGOGASTRODUODENOSCOPY: SHX5428

## 2021-11-03 LAB — GLUCOSE, CAPILLARY: Glucose-Capillary: 102 mg/dL — ABNORMAL HIGH (ref 70–99)

## 2021-11-03 SURGERY — EGD (ESOPHAGOGASTRODUODENOSCOPY)
Anesthesia: Monitor Anesthesia Care

## 2021-11-03 MED ORDER — SODIUM CHLORIDE (PF) 0.9 % IJ SOLN
INTRAMUSCULAR | Status: AC
  Filled 2021-11-03: qty 10

## 2021-11-03 MED ORDER — ONABOTULINUMTOXINA 100 UNITS IJ SOLR
INTRAMUSCULAR | Status: AC
  Filled 2021-11-03: qty 100

## 2021-11-03 MED ORDER — PROPOFOL 10 MG/ML IV BOLUS
INTRAVENOUS | Status: AC
  Filled 2021-11-03: qty 20

## 2021-11-03 MED ORDER — SODIUM CHLORIDE (PF) 0.9 % IJ SOLN
INTRAMUSCULAR | Status: DC | PRN
  Administered 2021-11-03: 4 mL via SUBMUCOSAL

## 2021-11-03 MED ORDER — LIDOCAINE 2% (20 MG/ML) 5 ML SYRINGE
INTRAMUSCULAR | Status: DC | PRN
  Administered 2021-11-03: 80 mg via INTRAVENOUS

## 2021-11-03 MED ORDER — PROPOFOL 10 MG/ML IV BOLUS
INTRAVENOUS | Status: DC | PRN
  Administered 2021-11-03: 50 mg via INTRAVENOUS
  Administered 2021-11-03 (×6): 20 mg via INTRAVENOUS

## 2021-11-03 MED ORDER — LACTATED RINGERS IV SOLN
INTRAVENOUS | Status: DC
  Administered 2021-11-03: 1000 mL via INTRAVENOUS

## 2021-11-03 MED ORDER — SODIUM CHLORIDE 0.9 % IV SOLN: INTRAVENOUS | Status: DC

## 2021-11-03 NOTE — Transfer of Care (Signed)
Immediate Anesthesia Transfer of Care Note  Patient: Christina Lewis  Procedure(s) Performed: ESOPHAGOGASTRODUODENOSCOPY (EGD) BOTOX INJECTION BALLOON DILATION  Patient Location: PACU  Anesthesia Type:MAC  Level of Consciousness: awake, alert  and oriented  Airway & Oxygen Therapy: Patient Spontanous Breathing and Patient connected to nasal cannula oxygen  Post-op Assessment: Report given to RN and Post -op Vital signs reviewed and stable  Post vital signs: Reviewed and stable  Last Vitals:  Vitals Value Taken Time  BP    Temp    Pulse    Resp    SpO2      Last Pain:  Vitals:   11/03/21 0650  TempSrc: Temporal  PainSc: 4          Complications: No notable events documented.

## 2021-11-03 NOTE — Anesthesia Postprocedure Evaluation (Signed)
Anesthesia Post Note  Patient: Christina Lewis  Procedure(s) Performed: ESOPHAGOGASTRODUODENOSCOPY (EGD) BOTOX INJECTION BALLOON DILATION     Patient location during evaluation: PACU Anesthesia Type: MAC Level of consciousness: awake and alert Pain management: pain level controlled Vital Signs Assessment: post-procedure vital signs reviewed and stable Respiratory status: spontaneous breathing, nonlabored ventilation and respiratory function stable Cardiovascular status: blood pressure returned to baseline Postop Assessment: no apparent nausea or vomiting Anesthetic complications: no   No notable events documented.  Last Vitals:  Vitals:   11/03/21 0810 11/03/21 0819  BP: 127/65 129/73  Pulse: 69 67  Resp: 14 11  Temp:    SpO2: 99% 100%    Last Pain:  Vitals:   11/03/21 0819  TempSrc:   PainSc: 0-No pain                 Marthenia Rolling

## 2021-11-03 NOTE — Interval H&P Note (Signed)
History and Physical Interval Note:  65/female with dysphagia for EGD with possible balloon dilation and botox injection with propofol. 11/03/2021 7:36 AM  Christina Lewis  has presented today for EGD, with the diagnosis of dysphagia, abnormal esophageal manometry.  The various methods of treatment have been discussed with the patient and family. After consideration of risks, benefits and other options for treatment, the patient has consented to  Procedure(s): ESOPHAGOGASTRODUODENOSCOPY (EGD) (N/A) BOTOX INJECTION (N/A) as a surgical intervention.  The patient's history has been reviewed, patient examined, no change in status, stable for surgery.  I have reviewed the patient's chart and labs.  Questions were answered to the patient's satisfaction.     Kerin Salen

## 2021-11-03 NOTE — Op Note (Signed)
[facility] Patient Name: Christina Lewis Procedure Date: 11/03/2021 MRN: [id number] Attending MD: Kerin Salen , MD Date of Birth: December 01, 1955 CSN: [id number] Age: 66 Admit Type: Outpatient Procedure:                Upper GI endoscopy Indications:              Dysphagia Providers:                Kerin Salen, MD, Fransisca Connors, Rozetta Nunnery,                            Technician Referring MD:             Gilford Silvius, FNP Medicines:                Monitored Anesthesia Care Complications:            No immediate complications. Estimated blood loss:                            Minimal. Estimated Blood Loss:     Estimated blood loss was minimal. Procedure:                Pre-Anesthesia Assessment:                           - Prior to the procedure, a History and Physical                            was performed, and patient medications and                            allergies were reviewed. The patient's tolerance of                            previous anesthesia was also reviewed. The risks                            and benefits of the procedure and the sedation                            options and risks were discussed with the patient.                            All questions were answered, and informed consent                            was obtained. Prior Anticoagulants: The patient has                            taken no previous anticoagulant or antiplatelet                            agents except for aspirin. ASA Grade Assessment: II                            - A patient with  mild systemic disease. After                            reviewing the risks and benefits, the patient was                            deemed in satisfactory condition to undergo the                            procedure.                           After obtaining informed consent, the endoscope was                            passed under direct vision. Throughout the                             procedure, the patient's blood pressure, pulse, and                            oxygen saturations were monitored continuously. The                            [id number] Olympus endoscope was introduced                            through the mouth, and advanced to the second part                            of duodenum. The upper GI endoscopy was                            accomplished without difficulty. The patient                            tolerated the procedure well. Scope In: Scope Out: Findings:      The upper third of the esophagus, middle third of the esophagus and       lower third of the esophagus were normal.      A widely patent Schatzki ring was found at the gastroesophageal       junction. A TTS dilator was passed through the scope. Dilation with an       18-19-20 mm balloon dilator was performed to 20 mm for 2 consecutive       minutes. The dilation site was examined following endoscope reinsertion       and showed no change. Area was successfully injected with 100 units       botulinum toxin, 25 units/ml, 1 ml in each quadrant of the esophagus, 1       cm above the GE junction.      A 3 cm hiatal hernia was present.      A few dispersed diminutive erosions with no bleeding and no stigmata of       recent bleeding were found in the gastric body and in the gastric antrum.  The cardia and gastric fundus were normal on retroflexion.      The examined duodenum was normal. Impression:               - Normal upper third of esophagus, middle third of                            esophagus and lower third of esophagus.                           - Widely patent Schatzki ring. Dilated. Injected                            with botulinum toxin.                           - 3 cm hiatal hernia.                           - Erosive gastropathy with no bleeding and no                            stigmata of recent bleeding.                           - Normal examined  duodenum.                           - No specimens collected. Moderate Sedation:      Patient did not receive moderate sedation for this procedure, but       instead received monitored anesthesia care. Recommendation:           - Resume regular diet.                           - Continue present medications. Procedure Code(s):        --- Professional ---                           [phone], Esophagogastroduodenoscopy, flexible,                            transoral; with transendoscopic balloon dilation of                            esophagus (less than 30 mm diameter)                           43236, 59, Esophagogastroduodenoscopy, flexible,                            transoral; with directed submucosal injection(s),                            any substance Diagnosis Code(s):        --- Professional ---  K22.2, Esophageal obstruction                           K44.9, Diaphragmatic hernia without obstruction or                            gangrene                           K31.89, Other diseases of stomach and duodenum                           R13.10, Dysphagia, unspecified CPT copyright 2019 [employer]. All rights reserved. The codes documented in this report are preliminary and upon coder review may  be revised to meet current compliance requirements. Ronnette Juniper, MD 11/03/2021 8:04:39 AM This report has been signed electronically. Number of Addenda: 0

## 2021-11-03 NOTE — Discharge Instructions (Signed)

## 2021-11-04 ENCOUNTER — Encounter (HOSPITAL_COMMUNITY): Payer: Self-pay | Admitting: Gastroenterology

## 2021-11-05 ENCOUNTER — Other Ambulatory Visit: Payer: Self-pay | Admitting: Physician Assistant

## 2021-11-05 ENCOUNTER — Other Ambulatory Visit: Payer: Self-pay | Admitting: Rheumatology

## 2021-11-05 NOTE — Telephone Encounter (Signed)
Next Visit: 04/06/2021  Last Visit: 09/02/2021  Last Fill: 10/06/2021  DX:  Rheumatoid arthritis of multiple sites with negative rheumatoid factor   Current Dose per office note 09/29/2021: methotrexate 6 tablets p.o. weekly.  If labs are normal we will increase the dose of methotrexate to 8 tablets p.o. weekly.  Labs: 10/19/2021 Glucose is 111. Rest of CMP WNL. WBC count remains elevated but is trending down.  Rest of CBC WNL.  Spoke with patient and she has increased to 8 tabs once weekly. Patient will be in next week to update labs.   Okay to refill MTX?

## 2021-11-05 NOTE — Telephone Encounter (Signed)
Next Visit: 12/09/2021   Last Visit: 09/02/2021   Last Fill: 10/05/2021   Dx: Fibromyalgia   Current Dose per office note on 09/02/2021: baclofen 10 mg 1 tablet in the morning and 1 tablet at noon and tizanidine 4 mg at bedtime as needed for muscle spasms   Okay to refill Baclofen and Tizanidine?

## 2021-11-06 ENCOUNTER — Other Ambulatory Visit: Payer: Self-pay | Admitting: *Deleted

## 2021-11-06 ENCOUNTER — Other Ambulatory Visit: Payer: Self-pay | Admitting: Rheumatology

## 2021-11-06 ENCOUNTER — Telehealth: Payer: Self-pay | Admitting: Rheumatology

## 2021-11-06 DIAGNOSIS — Z79899 Other long term (current) drug therapy: Secondary | ICD-10-CM

## 2021-11-06 NOTE — Telephone Encounter (Signed)
Lab Orders released.  

## 2021-11-06 NOTE — Telephone Encounter (Signed)
Patient called requesting labwork orders sent to [facility] in [place].  Patient plans to go this morning, 11/06/21.

## 2021-11-07 LAB — COMPLETE METABOLIC PANEL WITH GFR
AG Ratio: 2 (calc) (ref 1.0–2.5)
ALT: 19 U/L (ref 6–29)
AST: 18 U/L (ref 10–35)
Albumin: 4.4 g/dL (ref 3.6–5.1)
Alkaline phosphatase (APISO): 65 U/L (ref 37–153)
BUN: 17 mg/dL (ref 7–25)
CO2: 27 mmol/L (ref 20–32)
Calcium: 9.7 mg/dL (ref 8.6–10.4)
Chloride: 105 mmol/L (ref 98–110)
Creat: 0.79 mg/dL (ref 0.50–1.05)
Globulin: 2.2 g/dL (calc) (ref 1.9–3.7)
Glucose, Bld: 125 mg/dL — ABNORMAL HIGH (ref 65–99)
Potassium: 4.4 mmol/L (ref 3.5–5.3)
Sodium: 140 mmol/L (ref 135–146)
Total Bilirubin: 0.2 mg/dL (ref 0.2–1.2)
Total Protein: 6.6 g/dL (ref 6.1–8.1)
eGFR: 83 mL/min/{1.73_m2} (ref >=60)

## 2021-11-07 LAB — CBC WITH DIFFERENTIAL/PLATELET
Absolute Monocytes: 688 cells/uL (ref 200–950)
Basophils Absolute: 103 cells/uL (ref 0–200)
Basophils Relative: 1.2 %
Eosinophils Absolute: 525 cells/uL — ABNORMAL HIGH (ref 15–500)
Eosinophils Relative: 6.1 %
HCT: 40.4 % (ref 35.0–45.0)
Hemoglobin: 13.6 g/dL (ref 11.7–15.5)
Lymphs Abs: 3629 cells/uL (ref 850–3900)
MCH: 31.6 pg (ref 27.0–33.0)
MCHC: 33.7 g/dL (ref 32.0–36.0)
MCV: 94 fL (ref 80.0–100.0)
MPV: 9.9 fL (ref 7.5–12.5)
Monocytes Relative: 8 %
Neutro Abs: 3655 cells/uL (ref 1500–7800)
Neutrophils Relative %: 42.5 %
Platelets: 337 10*3/uL (ref 140–400)
RBC: 4.3 10*6/uL (ref 3.80–5.10)
RDW: 13.8 % (ref 11.0–15.0)
Total Lymphocyte: 42.2 %
WBC: 8.6 10*3/uL (ref 3.8–10.8)

## 2021-11-17 ENCOUNTER — Ambulatory Visit
Admission: RE | Admit: 2021-11-17 | Discharge: 2021-11-17 | Disposition: A | Discharge status: Home / Self Care | Payer: Medicare PPO | Source: Ambulatory Visit | Attending: Family Medicine | Admitting: Family Medicine

## 2021-11-17 VITALS — BP 120/76 | HR 88 | Temp 98.0°F | Resp 16

## 2021-11-17 DIAGNOSIS — H6593 Unspecified nonsuppurative otitis media, bilateral: Secondary | ICD-10-CM | POA: Diagnosis not present

## 2021-11-17 NOTE — ED Triage Notes (Signed)
Pt reports fullness in ears, bilateral ear pain, worse left ear; "left side face feels weird". Decongestant meds give some relief.

## 2021-11-17 NOTE — Discharge Instructions (Signed)
You have fluid behind both ears, however there is no infection.  Please switch the Xyzal to Zyrtec to help with the nasal congestion and fluid buildup behind the ears.  Continue the Flonase nasal spray in the Atrovent nasal spray.  Also start saline rinses to help get any of the congestion to drain out.  You can use Tylenol as needed for the ear pain.  If symptoms worsen or persist despite treatment, follow-up with primary care provider or ear nose and throat provider.

## 2021-11-17 NOTE — ED Provider Notes (Signed)
[facility]    CSN: [id number] Arrival date & time: 11/17/21  1239      History   Chief Complaint Chief Complaint  Patient presents with   Ear Fullness    Possible ear infection, ear is painful and feels stopped up. - Entered by patient   Otalgia    HPI Christina Lewis is a 66 y.o. female.   Patient presents with 3-4 days of ear pain.  Reports she feels like there is fluid behind her ears and when she moves her head, she hears the fluid.  Reports the ear pain is worst in the left ear.  Describes it as "pressure."  No drainage from the ear or fever.  She also denies significant nasal congestion.  She does later tell me that the past couple of days, she has woken up with significant postnasal drainage and wonders if this is also working.  She reports she woke up with a  sore throat this morning.  Denies known sick contacts.  Takes an oral antihistamine daily as well as 2 different nasal sprays for allergies.  Has been taking decongestant which helps until it wears off.       Past Medical History:  Diagnosis Date   Allergy    Anxiety    Arthritis    Depression    Diabetes mellitus    Fibromyalgia    GERD (gastroesophageal reflux disease)    Gout    Hematuria    History of kidney stones    History of tics    Ineffective esophageal motility    Interstitial cystitis 2016   Migraines    Osteoarthritis    PCOS (polycystic ovarian syndrome) 1993   Pneumonia 2019   PONV (postoperative nausea and vomiting)    Sleep apnea    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, took medication for a year   Thyroid nodule     Patient Active Problem List   Diagnosis Date Noted   Dysphagia 10/15/2021   Hyperlipidemia associated with type 2 diabetes mellitus ([place]) 10/15/2021   Hyperglycemia due to type 2 diabetes mellitus ([place]) 10/15/2021   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   Vitamin D deficiency 10/15/2021   Polycystic ovaries 10/15/2021   Migraine without aura  and without status migrainosus, not intractable 10/15/2021   BMI 29.0-29.9,adult 10/15/2021   Cellulitis 03/04/2021   Rheumatoid arthritis ([place]) 03/04/2021   Diabetes mellitus type 2 in obese ([place]) 03/04/2021   Right foot infection 03/04/2021   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 myelopathy 09/11/2019   Leukocytosis 11/04/2016   Fibromyalgia 02/10/2016   Other fatigue 02/10/2016   Primary osteoarthritis of both hands 02/10/2016   History of migraine 02/10/2016   History of thyroid nodule 02/10/2016   History of sleep apnea 02/10/2016   History of renal calculi 02/10/2016   Pain in joint, shoulder region 10/02/2010   Adhesive capsulitis of shoulder 10/02/2010   Muscle weakness (generalized) 10/02/2010    Past Surgical History:  Procedure Laterality Date   ABDOMINAL HYSTERECTOMY  2000   APPENDECTOMY  1976   BALLOON DILATION N/A 11/03/2021   Procedure: Larrie Kass DILATION;  Surgeon: Ronnette Juniper, MD;  Location: [facility];  Service: Gastroenterology;  Laterality: N/A;   BOTOX INJECTION N/A 11/03/2021   Procedure: BOTOX INJECTION;  Surgeon: Ronnette Juniper, MD;  Location: [facility];  Service: Gastroenterology;  Laterality: N/A;   BREAST LUMPECTOMY  1989   lt-negative   CARDIOVASCULAR STRESS TEST  2000   CATARACT EXTRACTION W/PHACO Left  12/07/2019   Procedure: CATARACT EXTRACTION PHACO AND INTRAOCULAR LENS PLACEMENT (IOC);  Surgeon: [name], MD;  Location: [facility];  Service: Ophthalmology;  Laterality: Left;  CDE: 5.55   CHOLECYSTECTOMY  1985   COLONOSCOPY     ESOPHAGEAL MANOMETRY N/A 08/24/2021   Procedure: ESOPHAGEAL MANOMETRY (EM);  Surgeon: Kerin Salen, MD;  Location: [facility];  Service: Gastroenterology;  Laterality: N/A;   ESOPHAGOGASTRODUODENOSCOPY N/A 11/03/2021   Procedure: ESOPHAGOGASTRODUODENOSCOPY (EGD);  Surgeon: Kerin Salen, MD;  Location: [facility];  Service: Gastroenterology;  Laterality: N/A;   HYSTERECTOMY ABDOMINAL WITH SALPINGECTOMY Bilateral 2000   KIDNEY STONE  SURGERY Right 2014   with stent placement in OR   KNEE ARTHROSCOPY Right 08/22/2013   Procedure: ARTHROSCOPY RIGHT KNEE FOR INFECTION LAVAGE AND DRAINAGE;  Surgeon: Thera Flake., MD;  Location: [facility];  Service: Orthopedics;  Laterality: Right;   KNEE ARTHROSCOPY WITH PATELLA RECONSTRUCTION Right 08/06/2013   Procedure: RIGHT KNEE ARTHROSCOPY WITH MENISCECTOMY MEDIAL, ARTHROSCOPY KNEE WITH DEBRIDEMENT/SHAVING (CHONDROPLASTY) ;  Surgeon: Thera Flake., MD;  Location: [facility];  Service: Orthopedics;  Laterality: Right;   LUMBAR LAMINECTOMY/DECOMPRESSION MICRODISCECTOMY Right 09/11/2019   Procedure: Right Lumbar Three-Four Microdiscectomy;  Surgeon: Maeola Harman, MD;  Location: [facility];  Service: Neurosurgery;  Laterality: Right;  Right Lumbar Three-Four Microdiscectomy   NASAL SINUS SURGERY     x4   OOPHORECTOMY     SHOULDER SURGERY  2011   right shoulder   THYROID SURGERY  1994   TONSILLECTOMY Bilateral 1974    OB History   No obstetric history on file.      Home Medications    Prior to Admission medications   Medication Sig Start Date End Date Taking? Authorizing Provider  acetaminophen (TYLENOL) 650 MG CR tablet Take 1,300 mg by mouth every 8 (eight) hours as needed for pain.    [provider]  aspirin EC 81 MG tablet Take 81 mg by mouth daily. Swallow whole.    [provider]  baclofen (LIORESAL) 10 MG tablet TAKE 1 TABLET EVERY MORNING AND 1 TABLET AT NOON. 11/05/21   Pollyann Savoy, MD  butalbital-acetaminophen-caffeine (FIORICET, ESGIC) 50-325-40 MG tablet Take 1-2 tablets by mouth 2 (two) times daily as needed for migraine.  08/31/16   [provider]  cholecalciferol (VITAMIN D) 25 MCG (1000 UNIT) tablet Take 1,000 Units by mouth daily.    [provider]  diclofenac Sodium (VOLTAREN) 1 % GEL APPLY 2 TO 4 GRAMS TO AFFECTED JOINTS UP TO 4 TIMES DAILY AS NEEDED. Patient taking differently: Apply 2 g  topically 4 (four) times daily as needed (joint pain). 09/20/19   Pollyann Savoy, MD  doxepin (SINEQUAN) 50 MG capsule Take 50 mg by mouth at bedtime.    [provider]  DULoxetine (CYMBALTA) 30 MG capsule Take 30 mg by mouth daily. 09/03/21   [provider]  DULoxetine (CYMBALTA) 60 MG capsule Take 60 mg by mouth daily.    [provider]  ferrous sulfate 325 (65 FE) MG tablet Take 325 mg by mouth daily.    [provider]  fluticasone (FLONASE ALLERGY RELIEF) 50 MCG/ACT nasal spray Place 1 spray into both nostrils in the morning and at bedtime. 09/16/21   [provider]  folic acid (FOLVITE) 1 MG tablet Take 2 tablets (2 mg total) by mouth daily. 10/06/21   Pollyann Savoy, MD  gabapentin (NEURONTIN) 300 MG capsule Take 300-600 mg by mouth See admin instructions. 600  mg in the morning, 300 mg at NOON, and 600 mg at bedtime 02/24/21   [provider]  hydroxychloroquine (PLAQUENIL) 200 MG tablet TAKE 1 TABLET TWICE DAILY MONDAY THRU FRIDAY FOR RHEUMATOID ARTHRITIS. 09/02/21   Gearldine Bienenstock, PA-C  ipratropium (ATROVENT) 0.06 % nasal spray Place 1 spray into both nostrils in the morning and at bedtime. 09/03/21   [provider]  levocetirizine (XYZAL) 5 MG tablet Take 5 mg by mouth at bedtime. 07/11/19   [provider]  MAGNESIUM PO Take 500 mg by mouth at bedtime.    [provider]  methotrexate (RHEUMATREX) 2.5 MG tablet Take 8 tablets (20 mg total) by mouth once a week. PROTECT FROM LIGHT 11/05/21   Pollyann Savoy, MD  Multiple Vitamin (MULTIVITAMIN WITH MINERALS) TABS tablet Take 1 tablet by mouth daily.    [provider]  omeprazole (PRILOSEC) 40 MG capsule Take 40 mg by mouth daily. 09/23/21   [provider]  ondansetron (ZOFRAN-ODT) 8 MG disintegrating tablet Take 8 mg by mouth every 8 (eight) hours as needed for nausea.     [provider]  tiZANidine (ZANAFLEX) 4 MG tablet TAKE 1  TABLET AT BEDTIME AS NEEDED 11/05/21   Pollyann Savoy, MD  TURMERIC-GINGER PO Take 1 tablet by mouth daily.    [provider]    Family History Family History  Problem Relation Age of Onset   Fibromyalgia Mother    Psoriasis Mother        psoriatic arthritis    Diabetes Mother    Heart disease Mother    Psoriasis Sister        psoriatic arthritis    Diabetes Sister    Rheum arthritis Sister    Fibromyalgia Sister    Diabetes Sister     Social History Social History   Tobacco Use   Smoking status: Never    Passive exposure: Current   Smokeless tobacco: Never  Vaping Use   Vaping Use: Never used  Substance Use Topics   Alcohol use: No   Drug use: Never     Allergies   Sulfa antibiotics, Codeine, Erythromycin, Metronidazole, and Morphine and related   Review of Systems Review of Systems Per HPI  Physical Exam Triage Vital Signs ED Triage Vitals [11/17/21 1251]  Enc Vitals Group     BP 120/76     Pulse Rate 88     Resp 16     Temp 98 F (36.7 C)     Temp Source Oral     SpO2 95 %     Weight      Height      Head Circumference      Peak Flow      Pain Score 5     Pain Loc      Pain Edu?      Excl. in GC?    No data found.  Updated Vital Signs BP 120/76 (BP Location: Right Arm)   Pulse 88   Temp 98 F (36.7 C) (Oral)   Resp 16   SpO2 95%   Visual Acuity Right Eye Distance:   Left Eye Distance:   Bilateral Distance:    Right Eye Near:   Left Eye Near:    Bilateral Near:     Physical Exam Vitals and nursing note reviewed.  Constitutional:      General: She is not in acute distress.    Appearance: Normal appearance. She is not toxic-appearing.  HENT:  Head: Normocephalic and atraumatic.     Right Ear: A middle ear effusion is present. Tympanic membrane is not injected, perforated, erythematous or bulging.     Left Ear: A middle ear effusion is present. Tympanic membrane is not injected, perforated, erythematous or  bulging.     Nose: Nose normal. No congestion.     Mouth/Throat:     Mouth: Mucous membranes are moist.     Pharynx: Oropharynx is clear.  Eyes:     General: No scleral icterus.    Extraocular Movements: Extraocular movements intact.  Pulmonary:     Effort: Pulmonary effort is normal. No respiratory distress.  Skin:    General: Skin is warm and dry.     Capillary Refill: Capillary refill takes less than 2 seconds.     Coloration: Skin is not jaundiced or pale.     Findings: No erythema.  Neurological:     Mental Status: She is alert and oriented to person, place, and time.  Psychiatric:        Behavior: Behavior is cooperative.      UC Treatments / Results  Labs (all labs ordered are listed, but only abnormal results are displayed) Labs Reviewed - No data to display  EKG   Radiology No results found.  Procedures Procedures (including critical care time)  Medications Ordered in UC Medications - No data to display  Initial Impression / Assessment and Plan / UC Course  I have reviewed the triage vital signs and the nursing notes.  Pertinent labs & imaging results that were available during my care of the patient were reviewed by me and considered in my medical decision making (see chart for details).    Patient is well-appearing, normotensive, afebrile, not tachycardic, not tachypneic, oxygenating well on room air.  On examination, patient has bilateral middle ear effusion.  No infection appreciated; antibiotics deferred.  Recommended continued use of nasal spray, start saline rinses.  Also recommended switching brands of oral antihistamine for possible better coverage during season changes.  Return and ER precautions discussed.  The patient was given the opportunity to ask questions.  All questions answered to their satisfaction.  The patient is in agreement to this plan.    Final Clinical Impressions(s) / UC Diagnoses   Final diagnoses:  Fluid level behind tympanic  membrane of both ears     Discharge Instructions      You have fluid behind both ears, however there is no infection.  Please switch the Xyzal to Zyrtec to help with the nasal congestion and fluid buildup behind the ears.  Continue the Flonase nasal spray in the Atrovent nasal spray.  Also start saline rinses to help get any of the congestion to drain out.  You can use Tylenol as needed for the ear pain.  If symptoms worsen or persist despite treatment, follow-up with primary care provider or ear nose and throat provider.    ED Prescriptions   None    PDMP not reviewed this encounter.   Valentino Nose, NP 11/17/21 1352

## 2021-11-19 ENCOUNTER — Ambulatory Visit: Payer: Medicare PPO | Admitting: Neurology

## 2021-11-20 ENCOUNTER — Ambulatory Visit: Payer: Medicare PPO | Admitting: Family Medicine

## 2021-12-04 ENCOUNTER — Other Ambulatory Visit: Payer: Self-pay | Admitting: Physician Assistant

## 2021-12-04 DIAGNOSIS — M0609 Rheumatoid arthritis without rheumatoid factor, multiple sites: Secondary | ICD-10-CM

## 2021-12-04 NOTE — Telephone Encounter (Signed)
Next Visit: 04/06/2021   Last Visit: 09/02/2021   Last Fill: 09/02/2021  DX:  Rheumatoid arthritis of multiple sites with negative rheumatoid factor    Current Dose per office note 09/29/2021: Plaquenil 200 mg 1 tablet by mouth twice daily Monday through Friday.  Labs: 10/19/2021 Glucose is 111. Rest of CMP WNL. WBC count remains elevated but is trending down.  Rest of CBC WNL.  PLQ Eye Exam: 10/07/2021 WNL   Okay to refill PLQ?

## 2021-12-09 ENCOUNTER — Ambulatory Visit: Payer: Medicare PPO | Admitting: Rheumatology

## 2021-12-11 DIAGNOSIS — Z20828 Contact with and (suspected) exposure to other viral communicable diseases: Secondary | ICD-10-CM | POA: Diagnosis not present

## 2021-12-14 ENCOUNTER — Telehealth: Payer: Self-pay | Admitting: Neurology

## 2021-12-14 ENCOUNTER — Encounter: Payer: Self-pay | Admitting: Neurology

## 2021-12-14 ENCOUNTER — Ambulatory Visit (INDEPENDENT_AMBULATORY_CARE_PROVIDER_SITE_OTHER): Payer: Self-pay | Admitting: Neurology

## 2021-12-14 DIAGNOSIS — Z91199 Patient's noncompliance with other medical treatment and regimen due to unspecified reason: Secondary | ICD-10-CM

## 2021-12-14 NOTE — Telephone Encounter (Signed)
Pt cancelled appt due to vomiting all night.

## 2021-12-14 NOTE — Progress Notes (Signed)
No showed

## 2021-12-21 ENCOUNTER — Ambulatory Visit: Payer: Medicare PPO | Admitting: Neurology

## 2021-12-22 ENCOUNTER — Encounter: Payer: Self-pay | Admitting: Neurology

## 2021-12-22 ENCOUNTER — Telehealth: Payer: Self-pay | Admitting: Neurology

## 2021-12-22 ENCOUNTER — Ambulatory Visit: Payer: Medicare PPO | Admitting: Neurology

## 2021-12-22 VITALS — BP 105/65 | HR 80 | Ht 64.0 in | Wt 172.4 lb

## 2021-12-22 DIAGNOSIS — G4733 Obstructive sleep apnea (adult) (pediatric): Secondary | ICD-10-CM | POA: Diagnosis not present

## 2021-12-22 DIAGNOSIS — G43711 Chronic migraine without aura, intractable, with status migrainosus: Secondary | ICD-10-CM

## 2021-12-22 DIAGNOSIS — G43109 Migraine with aura, not intractable, without status migrainosus: Secondary | ICD-10-CM | POA: Diagnosis not present

## 2021-12-22 MED ORDER — UBRELVY 100 MG PO TABS: 100.0000 mg | ORAL_TABLET | ORAL | 0 refills | Status: DC | PRN

## 2021-12-22 NOTE — Telephone Encounter (Signed)
Please start Vyepti protocol first injection 100mg  then increase to 300mg   G43.711
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
## 2021-12-22 NOTE — Telephone Encounter (Signed)
Order form completed, to sign.

## 2021-12-22 NOTE — Patient Instructions (Addendum)
Sleep evaluation Start Vyepti for migraines Ubrelvy  Ubrogepant Tablets What is this medication? UBROGEPANT (ue BROE je pant) treats migraines. It works by blocking a substance in the body that causes migraines. It is not used to prevent migraines. This medicine may be used for other purposes; ask your health care provider or pharmacist if you have questions. COMMON BRAND NAME(S): Christina Lewis What should I tell my care team before I take this medication? They need to know if you have any of these conditions: Kidney disease Liver disease An unusual or allergic reaction to ubrogepant, other medications, foods, dyes, or preservatives Pregnant or trying to get pregnant Breast-feeding How should I use this medication? Take this medication by mouth with a glass of water. Take it as directed on the prescription label. You can take it with or without food. If it upsets your stomach, take it with food. Keep taking it unless your care team tells you to stop. Talk to your care team about the use of this medication in children. Special care may be needed. Overdosage: If you think you have taken too much of this medicine contact a poison control center or emergency room at once. NOTE: This medicine is only for you. Do not share this medicine with others. What if I miss a dose? This does not apply. This medication is not for regular use. What may interact with this medication? Do not take this medication with any of the following: Adagrasib Ceritinib Certain antibiotics, such as chloramphenicol, clarithromycin, telithromycin Certain antivirals for HIV, such as atazanavir, cobicistat, darunavir, delavirdine, fosamprenavir, indinavir, ritonavir Certain medications for fungal infections, such as itraconazole, ketoconazole, posaconazole, voriconazole Conivaptan Grapefruit Idelalisib Mifepristone Nefazodone Ribociclib This medication may also interact with the following: Carvedilol Certain medications  for seizures, such as phenobarbital, phenytoin Ciprofloxacin Cyclosporine Eltrombopag Fluconazole Fluvoxamine Quinidine Rifampin St. John's wort Verapamil This list may not describe all possible interactions. Give your health care provider a list of all the medicines, herbs, non-prescription drugs, or dietary supplements you use. Also tell them if you smoke, drink alcohol, or use illegal drugs. Some items may interact with your medicine. What should I watch for while using this medication? Visit your care team for regular checks on your progress. Tell your care team if your symptoms do not start to get better or if they get worse. Your mouth may get dry. Chewing sugarless gum or sucking hard candy and drinking plenty of water may help. Contact your care team if the problem does not go away or is severe. What side effects may I notice from receiving this medication? Side effects that you should report to your care team as soon as possible: Allergic reactions--skin rash, itching, hives, swelling of the face, lips, tongue, or throat Side effects that usually do not require medical attention (report to your care team if they continue or are bothersome): Drowsiness Dry mouth Fatigue Nausea This list may not describe all possible side effects. Call your doctor for medical advice about side effects. You may report side effects to FDA at 1-800-FDA-1088. Where should I keep my medication? Keep out of the reach of children and pets. Store between 15 and 30 degrees C (59 and 86 degrees F). Get rid of any unused medication after the expiration date. To get rid of medications that are no longer needed or have expired: Take the medication to a medication take-back program. Check with your pharmacy or law enforcement to find a location. If you cannot return the medication, check the label  or package insert to see if the medication should be thrown out in the garbage or flushed down the toilet. If you  are not sure, ask your care team. If it is safe to put it in the trash, pour the medication out of the container. Mix the medication with cat litter, dirt, coffee grounds, or other unwanted substance. Seal the mixture in a bag or container. Put it in the trash. NOTE: This sheet is a summary. It may not cover all possible information. If you have questions about this medicine, talk to your doctor, pharmacist, or health care provider.  2023 Elsevier/Gold Standard (2021-04-01 00:00:00)    Eptinezumab Injection What is this medication? EPTINEZUMAB (EP ti NEZ ue mab) prevents migraines. It works by blocking a substance in the body that causes migraines. It is a monoclonal antibody. This medicine may be used for other purposes; ask your health care provider or pharmacist if you have questions. COMMON BRAND NAME(S): Vyepti What should I tell my care team before I take this medication? They need to know if you have any of these conditions: An unusual or allergic reaction to eptinezumab, other medications, foods, dyes, or preservatives Pregnant or trying to get pregnant Breast-feeding How should I use this medication? This medication is injected into a vein. It is given by your care team in a hospital or clinic setting. Talk to your care team about the use of this medication in children. Special care may be needed. Overdosage: If you think you have taken too much of this medicine contact a poison control center or emergency room at once. NOTE: This medicine is only for you. Do not share this medicine with others. What if I miss a dose? Keep appointments for follow-up doses. It is important not to miss your dose. Call your care team if you are unable to keep an appointment. What may interact with this medication? Interactions are not expected. This list may not describe all possible interactions. Give your health care provider a list of all the medicines, herbs, non-prescription drugs, or dietary  supplements you use. Also tell them if you smoke, drink alcohol, or use illegal drugs. Some items may interact with your medicine. What should I watch for while using this medication? Your condition will be monitored carefully while you are receiving this medication. What side effects may I notice from receiving this medication? Side effects that you should report to your care team as soon as possible: Allergic reactions or angioedema--skin rash, itching or hives, swelling of the face, eyes, lips, tongue, arms, or legs, trouble swallowing or breathing Side effects that usually do not require medical attention (report to your care team if they continue or are bothersome): Runny or stuffy nose This list may not describe all possible side effects. Call your doctor for medical advice about side effects. You may report side effects to FDA at 1-800-FDA-1088. Where should I keep my medication? This medication is given in a hospital or clinic. It will not be stored at home. NOTE: This sheet is a summary. It may not cover all possible information. If you have questions about this medicine, talk to your doctor, pharmacist, or health care provider.  2023 Elsevier/Gold Standard (2021-03-30 00:00:00)

## 2021-12-23 DIAGNOSIS — M5416 Radiculopathy, lumbar region: Secondary | ICD-10-CM | POA: Diagnosis not present

## 2021-12-23 DIAGNOSIS — M5136 Other intervertebral disc degeneration, lumbar region: Secondary | ICD-10-CM | POA: Diagnosis not present

## 2021-12-23 DIAGNOSIS — M5441 Lumbago with sciatica, right side: Secondary | ICD-10-CM | POA: Diagnosis not present

## 2021-12-23 DIAGNOSIS — M4126 Other idiopathic scoliosis, lumbar region: Secondary | ICD-10-CM | POA: Diagnosis not present

## 2021-12-28 NOTE — Telephone Encounter (Signed)
Was signed to [employer] in [place].

## 2021-12-28 NOTE — Telephone Encounter (Signed)
If I haven't signed it let me know

## 2021-12-29 ENCOUNTER — Telehealth: Payer: Self-pay | Admitting: Neurology

## 2021-12-29 ENCOUNTER — Other Ambulatory Visit: Payer: Self-pay | Admitting: Neurology

## 2021-12-29 MED ORDER — METHYLPREDNISOLONE 4 MG PO TBPK: ORAL_TABLET | ORAL | 1 refills | Status: DC

## 2021-12-29 NOTE — Telephone Encounter (Signed)
Called in a medrol dosepak thanks

## 2021-12-29 NOTE — Telephone Encounter (Signed)
I called pt and let her know medrol dose pack was called in. She appreciated this. She has taken before aware of side effects.

## 2021-12-29 NOTE — Telephone Encounter (Signed)
I called pt.  Since Friday she has had typical migraine (noted exception she said lays down eases, moving around worsens).  Level 5-7. Ice pack helps.  Ubrelvy samples (took 2 tabs total 2 hours apart- did not make a difference.  She is hydrated and eating normally.  Trigger most likely stress (in ED with husband Friday night for gall bladder surgery.  She did make mention that prednisone has helped her in the past at times.  Vyepti infusion is still in process. [facility].

## 2021-12-29 NOTE — Telephone Encounter (Signed)
Pt is calling. Said she is having a bad migraine and wants to know if there is anything Dr. Jaynee Eagles can prescribe to help her. Pt is requesting a call back from nurse.

## 2022-01-01 ENCOUNTER — Other Ambulatory Visit: Payer: Self-pay | Admitting: Rheumatology

## 2022-01-01 NOTE — Telephone Encounter (Signed)
Next Visit: 04/06/2022  Last Visit: 09/29/2021  Last Fill: 11/05/2021  DX: Rheumatoid arthritis of multiple sites with negative rheumatoid factor   Current Dose per office note on 09/29/2021:  If labs are normal we will increase the dose of methotrexate to 8 tablets p.o. weekly. Patient increased dose in September 2023.  Labs: 11/06/2021 Glucose is 125. Rest of CMP WNL. Absolute eosinophils are borderline elevated. Rest of CBC Wnl. We will continue to monitor lab work closely.   Okay to refill methotrexate?

## 2022-01-02 ENCOUNTER — Other Ambulatory Visit: Payer: Self-pay | Admitting: Rheumatology

## 2022-01-08 ENCOUNTER — Ambulatory Visit: Payer: Medicare PPO | Admitting: Family Medicine

## 2022-01-11 ENCOUNTER — Encounter: Payer: Self-pay | Admitting: Neurology

## 2022-01-11 ENCOUNTER — Ambulatory Visit (INDEPENDENT_AMBULATORY_CARE_PROVIDER_SITE_OTHER): Payer: Medicare PPO | Admitting: Neurology

## 2022-01-11 VITALS — BP 127/77 | HR 80 | Ht 64.0 in | Wt 174.2 lb

## 2022-01-11 DIAGNOSIS — R519 Headache, unspecified: Secondary | ICD-10-CM

## 2022-01-11 DIAGNOSIS — G4733 Obstructive sleep apnea (adult) (pediatric): Secondary | ICD-10-CM

## 2022-01-11 DIAGNOSIS — R351 Nocturia: Secondary | ICD-10-CM | POA: Diagnosis not present

## 2022-01-11 DIAGNOSIS — E663 Overweight: Secondary | ICD-10-CM

## 2022-01-11 NOTE — Progress Notes (Signed)
Subjective:    Patient ID: Christina Lewis is a 66 y.o. female.  HPI    Star Age, MD, PhD [facility] [address] [place], [place] [id number]  Dear Christina Lewis,  I saw your patient, Christina Lewis, upon your kind request in my sleep clinic today for initial consultation of her sleep disorder, in particular, evaluation of her prior diagnosis of obstructive sleep apnea.  The patient is unaccompanied today.  As you know, Ms. Baby is a 66 year old female with an underlying medical history of migraine headaches, allergies, arthritis, headache, depression, diabetes, fibromyalgia, reflux disease, gout, kidney stones, interstitial cystitis, PCOS, thyroid nodule, and overweight state, who reports a prior diagnosis of obstructive sleep apnea.  Prior sleep study results are not available for my review today, testing was in 2013.  She has not been on CPAP therapy in years, but had a CPAP machine in the past, compliance download is not available for my review today, had reevaluation of her sleep apnea in 2011, original diagnosis in the 90s and she was severe at the time per patient report.  I reviewed your office note from 12/22/2021.  Her Epworth sleepiness score is 5/24, fatigue severity score is 63 out of 63.  She has difficulty going to sleep.  In the past, she tried a prescription sleep aid but was too groggy the next day from it.  She has not tried any over-the-counter medication for sleep such as melatonin.  She goes to bed around 11 or midnight but may not be asleep until 4 or 5 AM.  She has gone a whole night without sleeping she states.  She had trouble tolerating the interface primarily.  She would be willing to get retested for sleep apnea but is wondering if she would be a candidate for inspire.  We talked about inspire candidacy and other alternative sleep apnea treatments.  She had a consultation with an oral surgeon some years ago but was not considered a  surgical candidate for sleep apnea management.  Years ago, she had a bite guard made for bruxism.  She has not used her machine from 2011 in years.  Rise time is between 9 and 10 most of the time, she lives with her husband, they have 4 cats in the household, the cat sleep in the bedroom with them.  She does not have a TV in her bedroom.  They have no children.  She is retired, she was a Agricultural engineer.  She does drink quite a bit of caffeine in the form of soda, 4 to 5 cans/day.  She does not drink any alcohol.  She is a non-smoker.  She has nocturia about once or twice per average night, rare morning headaches, migraines are not under good control and she is going to start a new medication for this.  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, took medication for a year   Thyroid nodule     Her Past Surgical History Is Significant For: Past Surgical History:  Procedure Laterality Date   ABDOMINAL HYSTERECTOMY  2000   APPENDECTOMY  1976   BALLOON DILATION N/A 11/03/2021   Procedure: BALLOON DILATION;  Surgeon: Ronnette Juniper, MD;  Location: [facility];  Service: Gastroenterology;  Laterality: N/A;   BOTOX INJECTION N/A 11/03/2021   Procedure: BOTOX INJECTION;  Surgeon: Ronnette Juniper, MD;  Location: [facility];  Service: Gastroenterology;  Laterality: N/A;   BREAST LUMPECTOMY  1989   lt-negative   CARDIOVASCULAR STRESS TEST  2000   CATARACT EXTRACTION W/PHACO Left 12/07/2019    Procedure: CATARACT EXTRACTION PHACO AND INTRAOCULAR LENS PLACEMENT (IOC);  Surgeon: Baruch Goldmann, MD;  Location: [facility];  Service: Ophthalmology;  Laterality: Left;  CDE: 5.55   CHOLECYSTECTOMY  1985   COLONOSCOPY     ESOPHAGEAL MANOMETRY N/A 08/24/2021   Procedure: ESOPHAGEAL MANOMETRY (EM);  Surgeon: Ronnette Juniper, MD;  Location: [facility];  Service: Gastroenterology;  Laterality: N/A;   ESOPHAGOGASTRODUODENOSCOPY N/A 11/03/2021   Procedure: ESOPHAGOGASTRODUODENOSCOPY (EGD);  Surgeon: Ronnette Juniper, MD;  Location: [facility];  Service: Gastroenterology;  Laterality: N/A;   HYSTERECTOMY ABDOMINAL WITH SALPINGECTOMY Bilateral 2000   KIDNEY STONE SURGERY Right 2014   with stent placement in OR   KNEE ARTHROSCOPY Right 08/22/2013   Procedure: ARTHROSCOPY RIGHT KNEE FOR INFECTION LAVAGE AND DRAINAGE;  Surgeon: Yvette Rack., MD;  Location: [place];  Service: Orthopedics;  Laterality: Right;   KNEE ARTHROSCOPY WITH PATELLA RECONSTRUCTION Right 08/06/2013   Procedure: RIGHT KNEE ARTHROSCOPY WITH MENISCECTOMY MEDIAL, ARTHROSCOPY KNEE WITH DEBRIDEMENT/SHAVING (CHONDROPLASTY) ;  Surgeon: Yvette Rack., MD;  Location: [facility];  Service: Orthopedics;  Laterality: Right;   LUMBAR LAMINECTOMY/DECOMPRESSION MICRODISCECTOMY Right 09/11/2019   Procedure: Right Lumbar Three-Four Microdiscectomy;  Surgeon: Erline Levine, MD;  Location: [facility];  Service: Neurosurgery;  Laterality: Right;  Right Lumbar Three-Four Microdiscectomy   NASAL SINUS SURGERY     x4   OOPHORECTOMY     SHOULDER SURGERY  2011   right shoulder   THYROID SURGERY  1994   TONSILLECTOMY Bilateral 1974    Her Family History Is Significant For: Family History  Problem Relation Age of Onset   Fibromyalgia Mother    Psoriasis Mother        psoriatic arthritis    Diabetes Mother    Heart disease Mother    Psoriasis Sister        psoriatic arthritis    Diabetes Sister    Migraines Sister    Rheum  arthritis Sister    Fibromyalgia Sister    Diabetes Sister     Her Social History Is Significant For: Social History   Socioeconomic History   Marital status: Married    Spouse name: Not on file   Number of children: Not on file   Years of education: Not on file   Highest education level: Not on file  Occupational History   Not on file  Tobacco Use   Smoking status: Never    Passive exposure: Current   Smokeless tobacco: Never  Vaping Use   Vaping Use: Never used  Substance and Sexual Activity   Alcohol use: No   Drug use: Never   Sexual activity: Yes  Other Topics Concern   Not on file  Social History Narrative   Caffiene 4-5 12  oz cans soda.   Working retired - special ed.   Live with home no kids   Social Determinants of Health   Financial Resource Strain: Not on file  Food Insecurity: Not on file  Transportation Needs: Not on file  Physical Activity: Not on file  Stress: Not on file  Social Connections: Not on file    Her Allergies Are:  Allergies  Allergen Reactions   Sulfa Antibiotics Anaphylaxis   Codeine Itching   Erythromycin Nausea And Vomiting   Metronidazole Diarrhea   Morphine And Related Itching  :   Her Current Medications Are:  Outpatient Encounter Medications as of 01/11/2022  Medication Sig   acetaminophen (TYLENOL) 650 MG CR tablet Take 1,300 mg by mouth every 8 (eight) hours as needed for pain.   aspirin EC 81 MG tablet Take 81 mg by mouth daily. Swallow whole.   baclofen (LIORESAL) 10 MG tablet TAKE 1 TABLET EVERY MORNING AND 1 TABLET AT NOON.   cetirizine (ZYRTEC) 10 MG tablet Take 10 mg by mouth daily.   cholecalciferol (VITAMIN D) 25 MCG (1000 UNIT) tablet Take 1,000 Units by mouth daily.   diclofenac Sodium (VOLTAREN) 1 % GEL APPLY 2 TO 4 GRAMS TO AFFECTED JOINTS UP TO 4 TIMES DAILY AS NEEDED. (Patient taking differently: Apply 2 g topically 4 (four) times daily as needed (joint pain).)   doxepin (SINEQUAN) 50 MG capsule Take 50  mg by mouth at bedtime.   DULoxetine (CYMBALTA) 30 MG capsule Take 30 mg by mouth daily.   DULoxetine (CYMBALTA) 60 MG capsule Take 60 mg by mouth daily.   ferrous sulfate 325 (65 FE) MG tablet Take 325 mg by mouth daily.   fluticasone (FLONASE ALLERGY RELIEF) 50 MCG/ACT nasal spray Place 1 spray into both nostrils in the morning and at bedtime.   folic acid (FOLVITE) 1 MG tablet Take 2 tablets (2 mg total) by mouth daily.   gabapentin (NEURONTIN) 300 MG capsule Take 300-600 mg by mouth See admin instructions. 600 mg in the morning, 300 mg at NOON, and 600 mg at bedtime   hydroxychloroquine (PLAQUENIL) 200 MG tablet TAKE 1 TABLET TWICE DAILY MONDAY THRU FRIDAY FOR RHEUMATOID ARTHRTITIS.   ipratropium (ATROVENT) 0.06 % nasal spray Place 1 spray into both nostrils in the morning and at bedtime.   MAGNESIUM PO Take 500 mg by mouth at bedtime.   methotrexate (RHEUMATREX) 2.5 MG tablet TAKE 8 TABLETS WEEKLY -- PROTECT FROM LIGHT   Multiple Vitamin (MULTIVITAMIN WITH MINERALS) TABS tablet Take 1 tablet by mouth daily.   omeprazole (PRILOSEC) 40 MG capsule Take 40 mg by mouth daily.   ondansetron (ZOFRAN-ODT) 8 MG disintegrating tablet Take 8 mg by mouth every 8 (eight) hours as needed for nausea.    tiZANidine (ZANAFLEX) 4 MG tablet TAKE 1 TABLET AT BEDTIME AS NEEDED   TURMERIC-GINGER PO Take 1 tablet by mouth daily.   Ubrogepant (UBRELVY) 100 MG TABS Take 100 mg by mouth every 2 (two) hours as needed. Maximum 200mg  a day.   [DISCONTINUED] methylPREDNISolone (MEDROL DOSEPAK) 4 MG TBPK tablet Take pills daily all together with food. Take the first dose (6 pills) as soon as possible. Take the rest each morning. For 6 days total 6-5-4-3-2-1.   No facility-administered encounter medications on file as of 01/11/2022.  :   Review of Systems:  Out of a complete 14 point review of systems, all are reviewed and negative with the exception of these symptoms as listed below:  Review of  Systems   Neurological:        Diagnosed 1970-1980's severe OSA.  Had cpap. (Had 4 sinus surgeries.  Stopped using, still will sleep issues - snoring.  Moved back here- new cpap sleep study 2011. (Done in [place]). Has migraines. ESS 5, FSS 63.    Objective:  Neurological Exam  Physical Exam Physical Examination:   Vitals:   01/11/22 0917  BP: 127/77  Pulse: 80    General Examination: The patient is a very pleasant 66 y.o. female in no acute distress. She appears well-developed and well-nourished and well groomed.   HEENT: Normocephalic, atraumatic, pupils are equal, round and reactive to light, extraocular tracking is good without limitation to gaze excursion or nystagmus noted. Hearing is grossly intact. Face is symmetric with normal facial animation. Speech is clear with no dysarthria noted. There is no hypophonia. There is no lip, neck/head, jaw or voice tremor. Neck is supple with full range of passive and active motion. There are no carotid bruits on auscultation. Oropharynx exam reveals: mild mouth dryness, adequate dental hygiene and moderate airway crowding, due to small airway entry, tonsils absent, Mallampati class II, wider tongue.  Tongue protrudes centrally and palate elevates symmetrically.  Neck circumference of 16 inches, moderate overbite.  Chest: Clear to auscultation without wheezing, rhonchi or crackles noted.  Heart: S1+S2+0, regular and normal without murmurs, rubs or gallops noted.   Abdomen: Soft, non-tender and non-distended.  Extremities: There is no pitting edema in the distal lower extremities bilaterally.   Skin: Warm and dry without trophic changes noted.   Musculoskeletal: exam reveals no obvious joint deformities.   Neurologically:  Mental status: The patient is awake, alert and oriented in all 4 spheres. Her immediate and remote memory, attention, language skills and fund of knowledge are appropriate. There is no evidence of aphasia, agnosia, apraxia  or anomia. Speech is clear with normal prosody and enunciation. Thought process is linear. Mood is normal and affect is normal.  Cranial nerves II - XII are as described above under HEENT exam.  Motor exam: Normal bulk, strength and tone is noted. There is no obvious action or resting tremor.  Fine motor skills and coordination: grossly intact.  Cerebellar testing: No dysmetria or intention tremor. There is no truncal or gait ataxia.  Sensory exam: intact to light touch in the upper and lower extremities.  Gait, station and balance: She stands easily. No veering to one side is noted. No leaning to one side is noted. Posture is age-appropriate and stance is narrow based. Gait shows normal stride length and normal pace. No problems turning are noted.   Assessment and plan:  In summary, KJERSTEN LICHTERMAN is a very pleasant 66 y.o.-year old female with an underlying medical history of migraine headaches, allergies, arthritis, headache, depression, diabetes, fibromyalgia, reflux disease, gout, kidney stones, interstitial cystitis, PCOS, thyroid nodule, and overweight state, who presents for evaluation of her obstructive sleep apnea.  She has not been evaluated in over 10 years, she has not used her CPAP in several years.  She reports a prior diagnosis of severe obstructive sleep apnea in the 90s.  She would be willing to get reevaluated and consider treatment again.   I had a long chat with the patient about my findings and the diagnosis of sleep apnea, particularly OSA, its prognosis and treatment options. We talked about medical/conservative treatments, surgical interventions and non-pharmacological approaches for symptom control. I explained, in particular, the risks and ramifications of untreated moderate to  severe OSA, especially with respect to developing cardiovascular disease down the road, including congestive heart failure (CHF), difficult to treat hypertension, cardiac arrhythmias (particularly A-fib),  neurovascular complications including TIA, stroke and dementia. Even type 2 diabetes has, in part, been linked to untreated OSA. Symptoms of untreated OSA may include (but may not be limited to) daytime sleepiness, nocturia (i.e. frequent nighttime urination), memory problems, mood irritability and suboptimally controlled or worsening mood disorder such as depression and/or anxiety, lack of energy, lack of motivation, physical discomfort, as well as recurrent headaches, especially morning or nocturnal headaches. We talked about the importance of maintaining a healthy lifestyle and striving for healthy weight.  In addition, we talked about the importance of striving for and maintaining good sleep hygiene. I recommended the following at this time: sleep study.  I outlined the differences between a laboratory attended sleep study which is considered more comprehensive and accurate over the option of a home sleep test (HST); the latter may lead to underestimation of sleep disordered breathing in some instances and does not help with diagnosing upper airway resistance syndrome and is not accurate enough to diagnose primary central sleep apnea typically. I explained the different sleep test procedures to the patient in detail and also outlined possible surgical and non-surgical treatment options of OSA, including the use of a pressure airway pressure (PAP) device (ie CPAP, AutoPAP/APAP or BiPAP in certain circumstances), a custom-made dental device (aka oral appliance, which would require a referral to a specialist dentist or orthodontist typically, and is generally speaking not considered a good choice for patients with full dentures or edentulous state), upper airway surgical options, such as traditional UPPP (which is not considered a first-line treatment) or the Inspire device (hypoglossal nerve stimulator, which would involve a referral for consultation with an ENT surgeon, after careful selection, following  inclusion criteria). I explained the PAP treatment option to the patient in detail, as this is generally considered first-line treatment.  The patient indicated that she would be willing to try PAP therapy, if the need arises. I explained the importance of being compliant with PAP treatment, not only for insurance purposes but primarily to improve patient's symptoms symptoms, and for the patient's long term health benefit, including to reduce Her cardiovascular risks longer-term.    We will pick up our discussion about the next steps and treatment options after testing.  We will keep her posted as to the test results by phone call and/or MyChart messaging where possible.  We will plan to follow-up in sleep clinic accordingly as well.  I answered all her questions today and the patient was in agreement.   I encouraged her to call with any interim questions, concerns, problems or updates or email [place] through MyChart.  Generally speaking, sleep test authorizations may take up to 2 weeks, sometimes less, sometimes longer, the patient is encouraged to get in touch with [place] if they do not hear back from the sleep lab staff directly within the next 2 weeks.  Thank you very much for allowing me to participate in the care of this nice patient. If I can be of any further assistance to you please do not hesitate to call me at [phone].  Sincerely,   Huston Foley, MD, PhD

## 2022-01-11 NOTE — Patient Instructions (Addendum)
Thank you for choosing [facility] for your sleep related care! It was nice to meet you today!   Here is what we discussed today:    Based on your symptoms and your exam I believe you are still at risk for obstructive sleep apnea (aka OSA). We should proceed with a sleep study to determine whether you do or do not have OSA and how severe it is. Even, if you have mild OSA, I may want you to consider treatment with CPAP, as treatment of even borderline or mild sleep apnea can result and improvement of symptoms such as sleep disruption, daytime sleepiness, nighttime bathroom breaks, restless leg symptoms, improvement of headache syndromes, even improved mood disorder.   As explained, an attended sleep study (meaning you get to stay overnight in the sleep lab), lets us monitor sleep-related behaviors such as sleep talking and leg movements in sleep, in addition to monitoring for sleep apnea.  A home sleep test is a screening tool for sleep apnea diagnosis only, but unfortunately, does not help with any other sleep-related diagnoses.  Please remember, the long-term risks and ramifications of untreated moderate to severe obstructive sleep apnea may include (but are not limited to): increased risk for cardiovascular disease, including congestive heart failure, stroke, difficult to control hypertension, treatment resistant obesity, arrhythmias, especially irregular heartbeat commonly known as A. Fib. (atrial fibrillation); even type 2 diabetes has been linked to untreated OSA.   Other correlations that untreated obstructive sleep apnea include macular edema which is swelling of the retina in the eyes, droopy eyelid syndrome, and elevated hemoglobin and hematocrit levels (often referred to as polycythemia).  Sleep apnea can cause disruption of sleep and sleep deprivation in most cases, which, in turn, can cause recurrent headaches, problems with memory, mood, concentration, focus, and  vigilance. Most people with untreated sleep apnea report excessive daytime sleepiness, which can affect their ability to drive. Please do not drive or use heavy equipment or machinery, if you feel sleepy! Patients with sleep apnea can also develop difficulty initiating and maintaining sleep (aka insomnia).   Having sleep apnea may increase your risk for other sleep disorders, including involuntary behaviors sleep such as sleep terrors, sleep talking, sleepwalking.    Having sleep apnea can also increase your risk for restless leg syndrome and leg movements at night.   Please note that untreated obstructive sleep apnea may carry additional perioperative morbidity. Patients with significant obstructive sleep apnea (typically, in the moderate to severe degree) should receive, if possible, perioperative PAP (positive airway pressure) therapy and the surgeons and particularly the anesthesiologists should be informed of the diagnosis and the severity of the sleep disordered breathing.   We will call you or email you through MyChart with regards to your test results and plan a follow-up in sleep clinic accordingly. Most likely, you will hear from one of our nurses.   Our sleep lab administrative assistant will call you to schedule your sleep study and give you further instructions, regarding the check in process for the sleep study, arrival time, what to bring, when you can expect to leave after the study, etc., and to answer any other logistical questions you may have. If you don't hear back from her by about 2 weeks from now, please feel free to call her direct line at [phone] or you can call our general clinic number, or email us through My Chart.   

## 2022-01-13 ENCOUNTER — Encounter: Payer: Self-pay | Admitting: Family Medicine

## 2022-01-13 ENCOUNTER — Ambulatory Visit (INDEPENDENT_AMBULATORY_CARE_PROVIDER_SITE_OTHER): Payer: Medicare PPO | Admitting: Family Medicine

## 2022-01-13 VITALS — BP 133/86 | HR 79 | Temp 96.8°F | Ht 64.0 in | Wt 172.0 lb

## 2022-01-13 DIAGNOSIS — F339 Major depressive disorder, recurrent, unspecified: Secondary | ICD-10-CM

## 2022-01-13 DIAGNOSIS — H6993 Unspecified Eustachian tube disorder, bilateral: Secondary | ICD-10-CM

## 2022-01-13 DIAGNOSIS — R42 Dizziness and giddiness: Secondary | ICD-10-CM | POA: Diagnosis not present

## 2022-01-13 MED ORDER — MECLIZINE HCL 25 MG PO TABS: 25.0000 mg | ORAL_TABLET | Freq: Three times a day (TID) | ORAL | 0 refills | Status: DC | PRN

## 2022-01-13 MED ORDER — METHYLPREDNISOLONE ACETATE 40 MG/ML IJ SUSP
40.0000 mg | Freq: Once | INTRAMUSCULAR | Status: AC
  Administered 2022-01-13: 60 mg via INTRAMUSCULAR

## 2022-01-13 NOTE — Progress Notes (Signed)
Subjective:  Patient ID: Christina Lewis, female    DOB: [birth year], 66 y.o.   MRN: [id number]  Patient Care Team: Baruch Gouty, FNP as PCP - General (Family Medicine) Okey Regal, OD (Optometry)   Chief Complaint:  Dizziness (Started end of September. Also has right ear pressure )   HPI: Christina Lewis is a 66 y.o. female presenting on 01/13/2022 for Dizziness (Started end of September. Also has right ear pressure )   Pt presents today for ongoing pressure and popping in her ears, worse in right than left. Does report dizziness at times with the pressure and noise. Denies other associated symptoms. This started about 1-1.5 months ago, is intermittent, and only lasts for a few seconds when it occurs. She has been using Flonase and taking antihistamine some relief of symptoms.      Relevant past medical, surgical, family, and social history reviewed and updated as indicated.  Allergies and medications reviewed and updated. Data reviewed: Chart in Epic.   Past Medical History:  Diagnosis Date   Allergy    Anxiety    Arthritis    Depression    Diabetes mellitus    Fibromyalgia    GERD (gastroesophageal reflux disease)    Gout    Hematuria    History of kidney stones    History of tics    Ineffective esophageal motility    Interstitial cystitis 2016   Migraines    Osteoarthritis    PCOS (polycystic ovarian syndrome) 1993   Pneumonia 2019   PONV (postoperative nausea and vomiting)    Sleep apnea    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, took medication for a year   Thyroid nodule     Past Surgical History:  Procedure Laterality Date   ABDOMINAL HYSTERECTOMY  2000   [place] N/A 11/03/2021   Procedure: BALLOON DILATION;  Surgeon: Ronnette Juniper, MD;  Location: [facility];  Service: Gastroenterology;  Laterality: N/A;   BOTOX INJECTION N/A 11/03/2021   Procedure: BOTOX INJECTION;  Surgeon: Ronnette Juniper, MD;   Location: [facility];  Service: Gastroenterology;  Laterality: N/A;   BREAST LUMPECTOMY  1989   lt-negative   CARDIOVASCULAR STRESS TEST  2000   CATARACT EXTRACTION W/PHACO Left 12/07/2019   Procedure: CATARACT EXTRACTION PHACO AND INTRAOCULAR LENS PLACEMENT (IOC);  Surgeon: Baruch Goldmann, MD;  Location: [facility];  Service: Ophthalmology;  Laterality: Left;  CDE: 5.55   CHOLECYSTECTOMY  1985   COLONOSCOPY     ESOPHAGEAL MANOMETRY N/A 08/24/2021   Procedure: ESOPHAGEAL MANOMETRY (EM);  Surgeon: Ronnette Juniper, MD;  Location: [facility];  Service: Gastroenterology;  Laterality: N/A;   ESOPHAGOGASTRODUODENOSCOPY N/A 11/03/2021   Procedure: ESOPHAGOGASTRODUODENOSCOPY (EGD);  Surgeon: Ronnette Juniper, MD;  Location: [facility];  Service: Gastroenterology;  Laterality: N/A;   HYSTERECTOMY ABDOMINAL WITH SALPINGECTOMY Bilateral 2000   KIDNEY STONE SURGERY Right 2014   with stent placement in OR   KNEE ARTHROSCOPY Right 08/22/2013   Procedure: ARTHROSCOPY RIGHT KNEE FOR INFECTION LAVAGE AND DRAINAGE;  Surgeon: Yvette Rack., MD;  Location: [facility];  Service: Orthopedics;  Laterality: Right;   KNEE ARTHROSCOPY WITH PATELLA RECONSTRUCTION Right 08/06/2013   Procedure: RIGHT KNEE ARTHROSCOPY WITH MENISCECTOMY MEDIAL, ARTHROSCOPY KNEE WITH DEBRIDEMENT/SHAVING (CHONDROPLASTY) ;  Surgeon: Yvette Rack., MD;  Location: [facility];  Service: Orthopedics;  Laterality: Right;   LUMBAR LAMINECTOMY/DECOMPRESSION MICRODISCECTOMY Right 09/11/2019   Procedure: Right  Lumbar Three-Four Microdiscectomy;  Surgeon: Erline Levine, MD;  Location: [facility];  Service: Neurosurgery;  Laterality: Right;  Right Lumbar Three-Four Microdiscectomy   NASAL SINUS SURGERY     x4   OOPHORECTOMY     SHOULDER SURGERY  2011   right shoulder   THYROID SURGERY  1994   TONSILLECTOMY Bilateral 1974    Social History   Socioeconomic History   Marital status: Married    Spouse name: Not on file   Number of  children: Not on file   Years of education: Not on file   Highest education level: Not on file  Occupational History   Not on file  Tobacco Use   Smoking status: Never    Passive exposure: Current   Smokeless tobacco: Never  Vaping Use   Vaping Use: Never used  Substance and Sexual Activity   Alcohol use: No   Drug use: Never   Sexual activity: Yes  Other Topics Concern   Not on file  Social History Narrative   Caffiene 4-5 12 oz cans soda.   Working retired - special ed.   Live with home no kids   Social Determinants of Health   Financial Resource Strain: Not on file  Food Insecurity: Not on file  Transportation Needs: Not on file  Physical Activity: Not on file  Stress: Not on file  Social Connections: Not on file  Intimate Partner Violence: Not on file    Outpatient Encounter Medications as of 01/13/2022  Medication Sig   acetaminophen (TYLENOL) 650 MG CR tablet Take 1,300 mg by mouth every 8 (eight) hours as needed for pain.   aspirin EC 81 MG tablet Take 81 mg by mouth daily. Swallow whole.   baclofen (LIORESAL) 10 MG tablet TAKE 1 TABLET EVERY MORNING AND 1 TABLET AT NOON.   cetirizine (ZYRTEC) 10 MG tablet Take 10 mg by mouth daily.   cholecalciferol (VITAMIN D) 25 MCG (1000 UNIT) tablet Take 1,000 Units by mouth daily.   diclofenac Sodium (VOLTAREN) 1 % GEL APPLY 2 TO 4 GRAMS TO AFFECTED JOINTS UP TO 4 TIMES DAILY AS NEEDED. (Patient taking differently: Apply 2 g topically 4 (four) times daily as needed (joint pain).)   doxepin (SINEQUAN) 50 MG capsule Take 50 mg by mouth at bedtime.   DULoxetine (CYMBALTA) 30 MG capsule Take 30 mg by mouth daily.   DULoxetine (CYMBALTA) 60 MG capsule Take 60 mg by mouth daily.   ferrous sulfate 325 (65 FE) MG tablet Take 325 mg by mouth daily.   fluticasone (FLONASE ALLERGY RELIEF) 50 MCG/ACT nasal spray Place 1 spray into both nostrils in the morning and at bedtime.   folic acid (FOLVITE) 1 MG tablet Take 2 tablets (2 mg total)  by mouth daily.   gabapentin (NEURONTIN) 300 MG capsule Take 300-600 mg by mouth See admin instructions. 600 mg in the morning, 300 mg at NOON, and 600 mg at bedtime   hydroxychloroquine (PLAQUENIL) 200 MG tablet TAKE 1 TABLET TWICE DAILY MONDAY THRU FRIDAY FOR RHEUMATOID ARTHRTITIS.   ipratropium (ATROVENT) 0.06 % nasal spray Place 1 spray into both nostrils in the morning and at bedtime.   MAGNESIUM PO Take 500 mg by mouth at bedtime.   meclizine (ANTIVERT) 25 MG tablet Take 1 tablet (25 mg total) by mouth 3 (three) times daily as needed for dizziness.   methotrexate (RHEUMATREX) 2.5 MG tablet TAKE 8 TABLETS WEEKLY -- PROTECT FROM LIGHT   Multiple Vitamin (MULTIVITAMIN WITH MINERALS) TABS tablet Take  1 tablet by mouth daily.   omeprazole (PRILOSEC) 40 MG capsule Take 40 mg by mouth daily.   ondansetron (ZOFRAN-ODT) 8 MG disintegrating tablet Take 8 mg by mouth every 8 (eight) hours as needed for nausea.    tiZANidine (ZANAFLEX) 4 MG tablet TAKE 1 TABLET AT BEDTIME AS NEEDED   TURMERIC-GINGER PO Take 1 tablet by mouth daily.   Ubrogepant (UBRELVY) 100 MG TABS Take 100 mg by mouth every 2 (two) hours as needed. Maximum 221m a day.   [EXPIRED] methylPREDNISolone acetate (DEPO-MEDROL) injection 40 mg    No facility-administered encounter medications on file as of 01/13/2022.    Allergies  Allergen Reactions   Sulfa Antibiotics Anaphylaxis   Codeine Itching   Erythromycin Nausea And Vomiting   Metronidazole Diarrhea   Morphine And Related Itching    Review of Systems  Constitutional:  Negative for activity change, appetite change, chills, diaphoresis, fatigue, fever and unexpected weight change.  HENT:  Positive for tinnitus. Negative for congestion, dental problem, drooling, ear discharge, ear pain, facial swelling, hearing loss, mouth sores, nosebleeds, postnasal drip, rhinorrhea, sinus pressure, sinus pain, sneezing, sore throat, trouble swallowing and voice change.   Eyes: Negative.   Negative for photophobia and visual disturbance.  Respiratory:  Negative for cough, chest tightness and shortness of breath.   Cardiovascular:  Negative for chest pain, palpitations and leg swelling.  Gastrointestinal:  Negative for abdominal pain, blood in stool, constipation, diarrhea, nausea and vomiting.  Endocrine: Negative.   Genitourinary:  Negative for decreased urine volume, difficulty urinating, dysuria, frequency and urgency.  Musculoskeletal:  Negative for arthralgias and myalgias.  Skin: Negative.   Allergic/Immunologic: Negative.   Neurological:  Positive for dizziness. Negative for tremors, seizures, syncope, facial asymmetry, speech difficulty, weakness, light-headedness, numbness and headaches.  Hematological: Negative.   Psychiatric/Behavioral:  Negative for confusion, hallucinations, sleep disturbance and suicidal ideas.   All other systems reviewed and are negative.       Objective:  BP 133/86   Pulse 79   Temp (!) 96.8 F (36 C) (Temporal)   Ht _0  (1.626 m)   Wt 172 lb (78 kg)   SpO2 95%   BMI 29.52 kg/m    Wt Readings from Last 3 Encounters:  01/13/22 172 lb (78 kg)  01/11/22 174 lb 3.2 oz (79 kg)  12/22/21 172 lb 6.4 oz (78.2 kg)    Physical Exam Vitals and nursing note reviewed.  Constitutional:      General: She is not in acute distress.    Appearance: Normal appearance. She is well-developed and well-groomed. She is not ill-appearing, toxic-appearing or diaphoretic.  HENT:     Head: Normocephalic and atraumatic.     Jaw: There is normal jaw occlusion.     Right Ear: Hearing normal. A middle ear effusion is present. Tympanic membrane is not erythematous.     Left Ear: Hearing normal. A middle ear effusion is present. Tympanic membrane is not erythematous.     Nose: Nose normal. No congestion or rhinorrhea.     Mouth/Throat:     Lips: Pink.     Mouth: Mucous membranes are moist.     Pharynx: Oropharynx is clear. Uvula midline.  Eyes:      General: Lids are normal.     Pupils: Pupils are equal, round, and reactive to light.  Neck:     Thyroid: No thyroid mass, thyromegaly or thyroid tenderness.     Vascular: No carotid bruit or JVD.  Trachea: Trachea and phonation normal.  Cardiovascular:     Rate and Rhythm: Normal rate and regular rhythm.     Chest Wall: PMI is not displaced.     Pulses: Normal pulses.     Heart sounds: Normal heart sounds. No murmur heard.    No friction rub. No gallop.  Pulmonary:     Effort: Pulmonary effort is normal. No respiratory distress.     Breath sounds: Normal breath sounds. No wheezing.  Abdominal:     General: There is no abdominal bruit.     Palpations: There is no hepatomegaly or splenomegaly.  Musculoskeletal:        General: Normal range of motion.     Cervical back: Normal range of motion and neck supple.     Right lower leg: No edema.     Left lower leg: No edema.  Lymphadenopathy:     Cervical: No cervical adenopathy.  Skin:    General: Skin is warm and dry.     Capillary Refill: Capillary refill takes less than 2 seconds.     Coloration: Skin is not cyanotic, jaundiced or pale.     Findings: No rash.  Neurological:     General: No focal deficit present.     Mental Status: She is alert and oriented to person, place, and time.     Sensory: Sensation is intact.     Motor: Tremor (resting) present.     Coordination: Coordination is intact.     Gait: Gait is intact.     Deep Tendon Reflexes: Reflexes are normal and symmetric.  Psychiatric:        Attention and Perception: Attention and perception normal.        Mood and Affect: Mood and affect normal.        Speech: Speech normal.        Behavior: Behavior normal. Behavior is cooperative.        Thought Content: Thought content normal.        Cognition and Memory: Cognition and memory normal.        Judgment: Judgment normal.     Results for orders placed or performed in visit on 11/06/21  COMPLETE METABOLIC PANEL  WITH GFR  Result Value Ref Range   Glucose, Bld 125 (H) 65 - 99 mg/dL   BUN 17 7 - 25 mg/dL   Creat 0.79 0.50 - 1.05 mg/dL   eGFR 83 > OR = 60 mL/min/1.102m   BUN/Creatinine Ratio SEE NOTE: 6 - 22 (calc)   Sodium 140 135 - 146 mmol/L   Potassium 4.4 3.5 - 5.3 mmol/L   Chloride 105 98 - 110 mmol/L   CO2 27 20 - 32 mmol/L   Calcium 9.7 8.6 - 10.4 mg/dL   Total Protein 6.6 6.1 - 8.1 g/dL   Albumin 4.4 3.6 - 5.1 g/dL   Globulin 2.2 1.9 - 3.7 g/dL (calc)   AG Ratio 2.0 1.0 - 2.5 (calc)   Total Bilirubin 0.2 0.2 - 1.2 mg/dL   Alkaline phosphatase (APISO) 65 37 - 153 U/L   AST 18 10 - 35 U/L   ALT 19 6 - 29 U/L  CBC with Differential/Platelet  Result Value Ref Range   WBC 8.6 3.8 - 10.8 Thousand/uL   RBC 4.30 3.80 - 5.10 Million/uL   Hemoglobin 13.6 11.7 - 15.5 g/dL   HCT 40.4 35.0 - 45.0 %   MCV 94.0 80.0 - 100.0 fL   MCH 31.6 27.0 - 33.0 pg  MCHC 33.7 32.0 - 36.0 g/dL   RDW 13.8 11.0 - 15.0 %   Platelets 337 140 - 400 Thousand/uL   MPV 9.9 7.5 - 12.5 fL   Neutro Abs 3,655 1,500 - 7,800 cells/uL   Lymphs Abs 3,629 850 - 3,900 cells/uL   Absolute Monocytes 688 200 - 950 cells/uL   Eosinophils Absolute 525 (H) 15 - 500 cells/uL   Basophils Absolute 103 0 - 200 cells/uL   Neutrophils Relative % 42.5 %   Total Lymphocyte 42.2 %   Monocytes Relative 8.0 %   Eosinophils Relative 6.1 %   Basophils Relative 1.2 %       Pertinent labs & imaging results that were available during my care of the patient were reviewed by me and considered in my medical decision making.  Assessment & Plan:  Elbert was seen today for dizziness.  Diagnoses and all orders for this visit:  Dysfunction of both eustachian tubes Vertigo Ongoing despite use of antihistamine and flonase. Will burst with steroids today to see if beneficial. Antivert as needed for significant dizziness. Sedation precautions provided. If symptoms persist, will refer to ENT.  -     meclizine (ANTIVERT) 25 MG tablet; Take 1  tablet (25 mg total) by mouth 3 (three) times daily as needed for dizziness. -     methylPREDNISolone acetate (DEPO-MEDROL) injection 40 mg  Depression, recurrent ([place]) Her current psychiatrist no longer takes her insurance. Will place new referral today.  -     Ambulatory referral to Psychiatry     Continue all other maintenance medications.  Follow up plan: Return if symptoms worsen or fail to improve.   Continue healthy lifestyle choices, including diet (rich in fruits, vegetables, and lean proteins, and low in salt and simple carbohydrates) and exercise (at least 30 minutes of moderate physical activity daily).  Educational handout given for eustachian tube dysfunction  The above assessment and management plan was discussed with the patient. The patient verbalized understanding of and has agreed to the management plan. Patient is aware to call the clinic if they develop any new symptoms or if symptoms persist or worsen. Patient is aware when to return to the clinic for a follow-up visit. Patient educated on when it is appropriate to go to the emergency department.   Monia Pouch, FNP-C [facility] [phone]

## 2022-01-18 ENCOUNTER — Encounter: Payer: Self-pay | Admitting: Family Medicine

## 2022-01-18 ENCOUNTER — Ambulatory Visit: Payer: Medicare PPO | Admitting: Neurology

## 2022-01-18 NOTE — Telephone Encounter (Signed)
Order labs for patient

## 2022-01-18 NOTE — Telephone Encounter (Signed)
Hold for PCP.

## 2022-01-19 ENCOUNTER — Ambulatory Visit: Payer: Medicare PPO | Admitting: Family Medicine

## 2022-01-20 ENCOUNTER — Ambulatory Visit (INDEPENDENT_AMBULATORY_CARE_PROVIDER_SITE_OTHER): Payer: Medicare PPO | Admitting: Family Medicine

## 2022-01-20 ENCOUNTER — Encounter: Payer: Self-pay | Admitting: Family Medicine

## 2022-01-20 VITALS — BP 122/78 | HR 81 | Temp 96.7°F | Ht 64.0 in | Wt 169.0 lb

## 2022-01-20 DIAGNOSIS — J0140 Acute pansinusitis, unspecified: Secondary | ICD-10-CM

## 2022-01-20 DIAGNOSIS — H6993 Unspecified Eustachian tube disorder, bilateral: Secondary | ICD-10-CM | POA: Diagnosis not present

## 2022-01-20 MED ORDER — AMOXICILLIN-POT CLAVULANATE 875-125 MG PO TABS: 1.0000 | ORAL_TABLET | Freq: Two times a day (BID) | ORAL | 0 refills | Status: AC

## 2022-01-20 NOTE — Addendum Note (Signed)
Addended by: Sonny Masters on: 01/20/2022 02:25 PM   Modules accepted: Orders

## 2022-01-20 NOTE — Progress Notes (Signed)
Subjective:    Patient ID: Christina Lewis, female    DOB: April 23, 1955, 66 y.o.   MRN: [id number]   Chief Complaint: Dizziness and ears feel stopped up (Was seen on 11/22 and states she is no better. )   Dizziness This is a recurrent problem. The current episode started more than 1 month ago (on and off since September). The problem occurs intermittently. The problem has been unchanged. Associated symptoms include congestion and headaches. Pertinent negatives include no chest pain, fatigue or fever. The symptoms are aggravated by bending and standing (position changes). Treatments tried: meclizine. The treatment provided mild relief.       Review of Systems  Constitutional:  Negative for fatigue and fever.  HENT:  Positive for congestion and sinus pressure. Negative for ear pain and rhinorrhea.        Ears feel full; can hear fluid moving in L ear  Respiratory:  Negative for shortness of breath.   Cardiovascular:  Negative for chest pain.  Neurological:  Positive for dizziness and headaches. Negative for syncope.  All other systems reviewed and are negative.      Objective:   Physical Exam Vitals and nursing note reviewed.  Constitutional:      General: She is not in acute distress.    Appearance: Normal appearance. She is not ill-appearing.  HENT:     Head: Normocephalic and atraumatic.     Right Ear: Ear canal and external ear normal. A middle ear effusion is present. Tympanic membrane is not perforated, erythematous or bulging.     Left Ear: Ear canal and external ear normal. A middle ear effusion is present. Tympanic membrane is not perforated, erythematous or bulging.     Nose: Congestion present.     Mouth/Throat:     Mouth: Mucous membranes are moist.     Pharynx: Oropharynx is clear. No oropharyngeal exudate or posterior oropharyngeal erythema.  Cardiovascular:     Rate and Rhythm: Normal rate and regular rhythm.     Heart sounds: Normal heart sounds.  Pulmonary:      Effort: Pulmonary effort is normal. No respiratory distress.     Breath sounds: Normal breath sounds. No wheezing, rhonchi or rales.  Skin:    General: Skin is warm and dry.  Neurological:     General: No focal deficit present.     Mental Status: She is alert and oriented to person, place, and time.  Psychiatric:        Mood and Affect: Mood normal.        Behavior: Behavior normal.     BP 122/78   Pulse 81   Temp (!) 96.7 F (35.9 C) (Temporal)   Ht 5\' 4"  (1.626 m)   Wt 169 lb (76.7 kg)   SpO2 96%   BMI 29.01 kg/m        Assessment & Plan:  Christina Lewis was seen today for dizziness and ears feel stopped up.  Diagnoses and all orders for this visit:  Dysfunction of both eustachian tubes Continued symptoms. No indications of bacterial infection. Will refer to ENT. -     Ambulatory referral to ENT   The above assessment and management plan was discussed with the patient. The patient verbalized understanding of and has agreed to the management plan. Patient is aware to call the clinic if they develop any new symptoms or if symptoms fail to improve or worsen. Patient is aware when to return to the clinic for a follow-up visit. Patient  educated on when it is appropriate to go to the emergency department.    Christina Bossier, NP Student   I personally was present during the history, physical exam, and medical decision-making activities of this visit and have verified that the services and findings are accurately documented in the nurse practitioner student's note.  Kari Baars, FNP-C [facility] [address] [place], [place] [address] [phone]

## 2022-01-26 DIAGNOSIS — G43711 Chronic migraine without aura, intractable, with status migrainosus: Secondary | ICD-10-CM | POA: Diagnosis not present

## 2022-02-01 ENCOUNTER — Telehealth: Payer: Self-pay | Admitting: Family Medicine

## 2022-02-01 ENCOUNTER — Other Ambulatory Visit: Payer: Self-pay | Admitting: Rheumatology

## 2022-02-01 NOTE — Telephone Encounter (Signed)
Next Visit: 04/06/2022  Last Visit: 09/29/2021  Last Fill: 11/05/2021  DX: Fibromyalgia   Current Dose per office note on 09/29/2021: not mentioned.   Okay to refill baclofen and tizanidine?

## 2022-02-01 NOTE — Telephone Encounter (Signed)
  Left message for patient to call back and schedule Medicare Annual Wellness Visit (AWV) to be completed by video or phone.  No hx of AWV eligible for AWVI per [facility] as of 10/23/2021  Please schedule at anytime with [facility] Nurse Health Advisor   45  Minutes appointment   Any questions, please call me at [phone]  

## 2022-02-08 ENCOUNTER — Encounter: Payer: Self-pay | Admitting: Family Medicine

## 2022-02-08 NOTE — Telephone Encounter (Signed)
Only options are flonase nasal  spray and OTC decongestant.- you can call the ENT and get put on cancellation list to see if they can see you sooner.

## 2022-02-09 ENCOUNTER — Ambulatory Visit (INDEPENDENT_AMBULATORY_CARE_PROVIDER_SITE_OTHER): Payer: Medicare PPO | Admitting: *Deleted

## 2022-02-09 VITALS — Ht 64.0 in | Wt 172.0 lb

## 2022-02-09 DIAGNOSIS — Z0000 Encounter for general adult medical examination without abnormal findings: Secondary | ICD-10-CM

## 2022-02-09 NOTE — Patient Instructions (Signed)
  [place] Maintenance Summary and Written Plan of Care  Ms. Redmond Pulling ,  Thank you for allowing me to perform your Medicare Annual Wellness Visit and for your ongoing commitment to your health.   Health Maintenance & Immunization History Health Maintenance  Topic Date Due   FOOT EXAM  Never done   Diabetic kidney evaluation - Urine ACR  Never done   COLONOSCOPY (Pts 45-39yr Insurance coverage will need to be confirmed)  Never done   Zoster Vaccines- Shingrix (2 of 2) 12/10/2021   DEXA SCAN  Never done   Pneumonia Vaccine 65 Years old (1 - PCV) 02/03/2022   COVID-19 Vaccine (1) 02/25/2022 (Originally 02/03/1961)   INFLUENZA VACCINE  05/23/2022 (Originally 09/22/2021)   HEMOGLOBIN A1C  04/17/2022   OPHTHALMOLOGY EXAM  10/08/2022   Diabetic kidney evaluation - eGFR measurement  11/07/2022   Medicare Annual Wellness (AWV)  02/10/2023   MAMMOGRAM  07/02/2023   DTaP/Tdap/Td (2 - Td or Tdap) 03/05/2031   Hepatitis C Screening  Completed   HIV Screening  Completed   HPV VACCINES  Aged Out   PAP SMEAR-Modifier  Discontinued   Immunization History  Administered Date(s) Administered   Tdap 03/04/2021   Zoster Recombinat (Shingrix) 10/15/2021    These are the patient goals that we discussed:  Goals Addressed             This Visit's Progress    Patient Stated       02/09/2022 AWV Goal: Keep All Scheduled Appointments  Over the next year, patient will attend all scheduled appointments with their PCP and any specialists that they see.          This is a list of Health Maintenance Items that are overdue or due now: Health Maintenance Due  Topic Date Due   FOOT EXAM  Never done   Diabetic kidney evaluation - Urine ACR  Never done   COLONOSCOPY (Pts 45-469yrInsurance coverage will need to be confirmed)  Never done   Zoster Vaccines- Shingrix (2 of 2) 12/10/2021   DEXA SCAN  Never done   Pneumonia Vaccine 6516Years old (1 - PCV) 02/03/2022

## 2022-02-09 NOTE — Progress Notes (Signed)
I connected with  Christina Lewis on 02/09/22 by a audio enabled telemedicine application and verified that I am speaking with the correct person using two identifiers.  Patient Location: Home  Provider Location: Office/Clinic  I discussed the limitations of evaluation and management by telemedicine. The patient expressed understanding and agreed to proceed.  Subjective:   Christina Lewis is a 66 y.o. female who presents for an Initial Medicare Annual Wellness Visit.   Cardiac Risk Factors include: advanced age (>36mn, >>70women);diabetes mellitus     Objective:    Today's Vitals   02/09/22 1359  Weight: 172 lb (78 kg)  Height: 5' 4" (1.626 m)  PainSc: 4    Body mass index is 29.52 kg/m.     02/09/2022    2:05 PM 11/03/2021    6:48 AM 06/08/2021   11:16 AM 04/23/2021   10:15 AM 03/04/2021    1:44 AM 09/11/2020   12:33 PM 12/07/2019    8:08 AM  Advanced Directives  Does Patient Have a Medical Advance Directive? _0  No No  Would patient like information on creating a medical advance directive? No - Patient declined No - Patient declined No - Patient declined No - Patient declined No - Patient declined No - Patient declined No - Patient declined    Current Medications (verified) Outpatient Encounter Medications as of 02/09/2022  Medication Sig   acetaminophen (TYLENOL) 650 MG CR tablet Take 1,300 mg by mouth every 8 (eight) hours as needed for pain.   aspirin EC 81 MG tablet Take 81 mg by mouth daily. Swallow whole.   baclofen (LIORESAL) 10 MG tablet TAKE 1 TABLET EVERY MORNING AND 1 TABLET AT NOON.   cetirizine (ZYRTEC) 10 MG tablet Take 10 mg by mouth daily.   cholecalciferol (VITAMIN D) 25 MCG (1000 UNIT) tablet Take 1,000 Units by mouth daily.   diclofenac Sodium (VOLTAREN) 1 % GEL APPLY 2 TO 4 GRAMS TO AFFECTED JOINTS UP TO 4 TIMES DAILY AS NEEDED. (Patient taking differently: Apply 2 g topically 4 (four) times daily as needed (joint pain).)   doxepin  (SINEQUAN) 50 MG capsule Take 50 mg by mouth at bedtime.   DULoxetine (CYMBALTA) 30 MG capsule Take 30 mg by mouth daily.   DULoxetine (CYMBALTA) 60 MG capsule Take 60 mg by mouth daily.   ferrous sulfate 325 (65 FE) MG tablet Take 325 mg by mouth daily.   fluticasone (FLONASE ALLERGY RELIEF) 50 MCG/ACT nasal spray Place 1 spray into both nostrils in the morning and at bedtime.   folic acid (FOLVITE) 1 MG tablet Take 2 tablets (2 mg total) by mouth daily.   gabapentin (NEURONTIN) 300 MG capsule Take 300-600 mg by mouth See admin instructions. 600 mg in the morning, 300 mg at NOON, and 600 mg at bedtime   Reports taking only at night   hydroxychloroquine (PLAQUENIL) 200 MG tablet TAKE 1 TABLET TWICE DAILY MONDAY THRU FRIDAY FOR RHEUMATOID ARTHRTITIS.   MAGNESIUM PO Take 500 mg by mouth at bedtime.   meclizine (ANTIVERT) 25 MG tablet Take 1 tablet (25 mg total) by mouth 3 (three) times daily as needed for dizziness.   methotrexate (RHEUMATREX) 2.5 MG tablet TAKE 8 TABLETS WEEKLY -- PROTECT FROM LIGHT   Multiple Vitamin (MULTIVITAMIN WITH MINERALS) TABS tablet Take 1 tablet by mouth daily.   omeprazole (PRILOSEC) 40 MG capsule Take 40 mg by mouth daily.   ondansetron (ZOFRAN-ODT) 8 MG disintegrating tablet Take 8 mg  by mouth every 8 (eight) hours as needed for nausea.    tiZANidine (ZANAFLEX) 4 MG tablet TAKE 1 TABLET AT BEDTIME AS NEEDED   TURMERIC-GINGER PO Take 1 tablet by mouth daily.   ipratropium (ATROVENT) 0.06 % nasal spray Place 1 spray into both nostrils in the morning and at bedtime. (Patient not taking: Reported on 02/09/2022)   Ubrogepant (UBRELVY) 100 MG TABS Take 100 mg by mouth every 2 (two) hours as needed. Maximum 251m a day. (Patient not taking: Reported on 02/09/2022)   No facility-administered encounter medications on file as of 02/09/2022.    Allergies (verified) Sulfa antibiotics, Codeine, Erythromycin, Metronidazole, and Morphine and related   History: Past Medical  History:  Diagnosis Date   Allergy    Anxiety    Arthritis    Depression    Diabetes mellitus    Fibromyalgia    GERD (gastroesophageal reflux disease)    Gout    Hematuria    History of kidney stones    History of tics    Ineffective esophageal motility    Interstitial cystitis 2016   Migraines    Osteoarthritis    PCOS (polycystic ovarian syndrome) 1993   Pneumonia 2019   PONV (postoperative nausea and vomiting)    Sleep apnea    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, took medication for a year   Thyroid nodule    Past Surgical History:  Procedure Laterality Date   ABDOMINAL HYSTERECTOMY  2000   AMattesonN/A 11/03/2021   Procedure: BALLOON DILATION;  Surgeon: KRonnette Juniper MD;  Location: [facility];  Service: Gastroenterology;  Laterality: N/A;   BOTOX INJECTION N/A 11/03/2021   Procedure: BOTOX INJECTION;  Surgeon: KRonnette Juniper MD;  Location: [facility];  Service: Gastroenterology;  Laterality: N/A;   BREAST LUMPECTOMY  1989   lt-negative   CARDIOVASCULAR STRESS TEST  2000   CATARACT EXTRACTION W/PHACO Left 12/07/2019   Procedure: CATARACT EXTRACTION PHACO AND INTRAOCULAR LENS PLACEMENT (IOC);  Surgeon: WBaruch Goldmann MD;  Location: [facility];  Service: Ophthalmology;  Laterality: Left;  CDE: 5.55   CHOLECYSTECTOMY  1985   COLONOSCOPY     ESOPHAGEAL MANOMETRY N/A 08/24/2021   Procedure: ESOPHAGEAL MANOMETRY (EM);  Surgeon: KRonnette Juniper MD;  Location: [facility];  Service: Gastroenterology;  Laterality: N/A;   ESOPHAGOGASTRODUODENOSCOPY N/A 11/03/2021   Procedure: ESOPHAGOGASTRODUODENOSCOPY (EGD);  Surgeon: KRonnette Juniper MD;  [facility];  Service: Gastroenterology;  Laterality: N/A;   HYSTERECTOMY ABDOMINAL WITH SALPINGECTOMY Bilateral 2000   KIDNEY STONE SURGERY Right 2014   with stent placement in OR   KNEE ARTHROSCOPY Right 08/22/2013   Procedure: ARTHROSCOPY RIGHT KNEE FOR INFECTION LAVAGE AND DRAINAGE;   Surgeon: WYvette Rack, MD;  [facility]  Service: Orthopedics;  Laterality: Right;   KNEE ARTHROSCOPY WITH PATELLA RECONSTRUCTION Right 08/06/2013   Procedure: RIGHT KNEE ARTHROSCOPY WITH MENISCECTOMY MEDIAL, ARTHROSCOPY KNEE WITH DEBRIDEMENT/SHAVING (CHONDROPLASTY) ;  Surgeon: WYvette Rack, MD;  [facility]  Service: Orthopedics;  Laterality: Right;   LUMBAR LAMINECTOMY/DECOMPRESSION MICRODISCECTOMY Right 09/11/2019   Procedure: Right Lumbar Three-Four Microdiscectomy;  Surgeon: SErline Levine MD;  [facility]  Service: Neurosurgery;  Laterality: Right;  Right Lumbar Three-Four Microdiscectomy   NASAL SINUS SURGERY     x4   OOPHORECTOMY     SHOULDER SURGERY  2011   right shoulder   THYROID SURGERY  1994   TONSILLECTOMY Bilateral 1974  Family History  Problem Relation Age of Onset   Fibromyalgia Mother    Psoriasis Mother        psoriatic arthritis    Diabetes Mother    Heart disease Mother    Psoriasis Sister        psoriatic arthritis    Diabetes Sister    Migraines Sister    Rheum arthritis Sister    Fibromyalgia Sister    Diabetes Sister    Social History   Socioeconomic History   Marital status: Married    Spouse name: Gwyndolyn Saxon   Number of children: Not on file   Years of education: Not on file   Highest education level: Bachelor's degree (e.g., BA, AB, BS)  Occupational History   Occupation: retired  Tobacco Use   Smoking status: Never    Passive exposure: Never   Smokeless tobacco: Never  Vaping Use   Vaping Use: Never used  Substance and Sexual Activity   Alcohol use: No   Drug use: Never   Sexual activity: Yes  Other Topics Concern   Not on file  Social History Narrative   Caffiene 4-5 12 oz cans soda.   Working retired - special ed.   Live with home no kids   Social Determinants of Health   Financial Resource Strain: Low Risk  (02/09/2022)   Overall Financial Resource Strain (CARDIA)     Difficulty of Paying Living Expenses: Not hard at all  Food Insecurity: No Food Insecurity (02/09/2022)   Hunger Vital Sign    Worried About Running Out of Food in the Last Year: Never true    Ran Out of Food in the Last Year: Never true  Transportation Needs: No Transportation Needs (02/09/2022)   PRAPARE - Hydrologist (Medical): No    Lack of Transportation (Non-Medical): No  Physical Activity: Inactive (02/09/2022)   Exercise Vital Sign    Days of Exercise per Week: 0 days    Minutes of Exercise per Session: 0 min  Stress: No Stress Concern Present (02/09/2022)   [place]    Feeling of Stress : Not at all  Social Connections: Moderately Integrated (02/09/2022)   Social Connection and Isolation Panel [NHANES]    Frequency of Communication with Friends and Family: More than three times a week    Frequency of Social Gatherings with Friends and Family: Once a week    Attends Religious Services: Never    Marine scientist or Organizations: Yes    Attends Archivist Meetings: Never    Marital Status: Married    Tobacco Counseling Counseling given: No   Clinical Intake:  Pre-visit preparation completed: Yes  Pain : 0-10 Pain Score: 4  Pain Type: Chronic pain Pain Location: Other (Comment) (all over, fibromyalgia, rheumatoid arthritis) Pain Descriptors / Indicators: Aching Pain Onset: More than a month ago Pain Frequency: Constant Pain Relieving Factors: tylenol sometimes helps Effect of Pain on Daily Activities: not alot  Pain Relieving Factors: tylenol sometimes helps  BMI - recorded: 29.52 Nutritional Status: BMI 25 -29 Overweight Nutritional Risks: None Diabetes: Yes CBG done?: No Did pt. bring in CBG monitor from home?: No  How often do you need to have someone help you when you read instructions, pamphlets, or other written materials from your doctor or  pharmacy?: 1 - Never What is the last grade level you completed in school?: college graduat  Diabetic? no  Interpreter Needed?:  No  Information entered by :: Baldomero Lamy, LPN   Activities of Daily Living    02/09/2022    2:10 PM 02/08/2022    2:37 PM  In your present state of health, do you have any difficulty performing the following activities:  Hearing? 0 0  Vision? 0 0  Difficulty concentrating or making decisions? 0 0  Walking or climbing stairs? 1 0  Comment bad back, fibromyalgia, rheumatoid arthritis   Dressing or bathing? 0 0  Doing errands, shopping? 0 0  Preparing Food and eating ? N N  Using the Toilet? N N  In the past six months, have you accidently leaked urine? N N  Do you have problems with loss of bowel control? N N  Managing your Medications? N N  Managing your Finances? N N  Housekeeping or managing your Housekeeping? N N    Patient Care Team: Baruch Gouty, FNP as PCP - General (Family Medicine) Okey Regal, OD (Optometry)  Indicate any recent Medical Services you may have received from other than [facility] providers in the past year (date may be approximate).     Assessment:   This is a routine wellness examination for Teela.  Hearing/Vision screen No results found.  Dietary issues and exercise activities discussed: Current Exercise Habits: The patient does not participate in regular exercise at present, Exercise limited by: orthopedic condition(s)   Goals Addressed             This Visit's Progress    Patient Stated       02/09/2022 AWV Goal: Keep All Scheduled Appointments  Over the next year, patient will attend all scheduled appointments with their PCP and any specialists that they see.       Depression Screen    02/09/2022    2:06 PM 10/15/2021   12:06 PM  PHQ 2/9 Scores  PHQ - 2 Score 6 6  PHQ- 9 Score 11 19    Fall Risk    02/09/2022    2:06 PM 02/08/2022    2:37 PM  Fall Risk   Falls in the past year? 0 0   Injury with Fall?  0    FALL RISK PREVENTION PERTAINING TO THE HOME:  Any stairs in or around the home? Yes  If so, are there any without handrails? Yes  Home free of loose throw rugs in walkways, pet beds, electrical cords, etc? Yes  Adequate lighting in your home to reduce risk of falls? Yes   ASSISTIVE DEVICES UTILIZED TO PREVENT FALLS:  Life alert? No  Use of a cane, walker or w/c? No  Grab bars in the bathroom? No  Shower chair or bench in shower? No  Elevated toilet seat or a handicapped toilet? No    Cognitive Function:        02/09/2022    2:12 PM  6CIT Screen  What Year? 0 points  What month? 0 points  What time? 0 points  Count back from 20 0 points  Months in reverse 0 points  Repeat phrase 0 points  Total Score 0 points    Immunizations Immunization History  Administered Date(s) Administered   Tdap 03/04/2021   Zoster Recombinat (Shingrix) 10/15/2021    Screening Tests Health Maintenance  Topic Date Due   FOOT EXAM  Never done   Diabetic kidney evaluation - Urine ACR  Never done   COLONOSCOPY (Pts 45-45yr Insurance coverage will need to be confirmed)  Never done   Zoster Vaccines- Shingrix (  2 of 2) 12/10/2021   DEXA SCAN  Never done   Pneumonia Vaccine 66+ Years old (1 - PCV) 02/03/2022   COVID-19 Vaccine (1) 02/25/2022 (Originally 02/03/1961)   INFLUENZA VACCINE  05/23/2022 (Originally 09/22/2021)   HEMOGLOBIN A1C  04/17/2022   OPHTHALMOLOGY EXAM  10/08/2022   Diabetic kidney evaluation - eGFR measurement  11/07/2022   Medicare Annual Wellness (AWV)  02/10/2023   MAMMOGRAM  07/02/2023   DTaP/Tdap/Td (2 - Td or Tdap) 03/05/2031   Hepatitis C Screening  Completed   HIV Screening  Completed   HPV VACCINES  Aged Out   PAP SMEAR-Modifier  Discontinued    Health Maintenance  Health Maintenance Due  Topic Date Due   FOOT EXAM  Never done   Diabetic kidney evaluation - Urine ACR  Never done   COLONOSCOPY (Pts 45-20yr Insurance coverage  will need to be confirmed)  Never done   Zoster Vaccines- Shingrix (2 of 2) 12/10/2021   DEXA SCAN  Never done   Pneumonia Vaccine 65 Years old (1 - PCV) 02/03/2022    Community Resource Referral / Chronic Care Management: CRR required this visit?  No   CCM required this visit?  No      Plan:     I have personally reviewed and noted the following in the patient's chart:   Medical and social history Use of alcohol, tobacco or illicit drugs  Current medications and supplements including opioid prescriptions. Patient is not currently taking opioid prescriptions. Functional ability and status Nutritional status Physical activity Advanced directives List of other physicians Hospitalizations, surgeries, and ER visits in previous 12 months Vitals Screenings to include cognitive, depression, and falls Referrals and appointments  In addition, I have reviewed and discussed with patient certain preventive protocols, quality metrics, and best practice recommendations. A written personalized care plan for preventive services as well as general preventive health recommendations were provided to patient.     BWillette Pa LPN   [id number]  Nurse Notes: After visit summary mailed to patient.

## 2022-02-10 ENCOUNTER — Other Ambulatory Visit: Payer: Self-pay | Admitting: Endocrinology

## 2022-02-10 DIAGNOSIS — E1165 Type 2 diabetes mellitus with hyperglycemia: Secondary | ICD-10-CM | POA: Diagnosis not present

## 2022-02-10 DIAGNOSIS — E559 Vitamin D deficiency, unspecified: Secondary | ICD-10-CM | POA: Diagnosis not present

## 2022-02-10 DIAGNOSIS — R1310 Dysphagia, unspecified: Secondary | ICD-10-CM | POA: Diagnosis not present

## 2022-02-10 DIAGNOSIS — E7800 Pure hypercholesterolemia, unspecified: Secondary | ICD-10-CM | POA: Diagnosis not present

## 2022-02-10 DIAGNOSIS — M069 Rheumatoid arthritis, unspecified: Secondary | ICD-10-CM | POA: Diagnosis not present

## 2022-02-10 DIAGNOSIS — E049 Nontoxic goiter, unspecified: Secondary | ICD-10-CM | POA: Diagnosis not present

## 2022-02-10 DIAGNOSIS — E282 Polycystic ovarian syndrome: Secondary | ICD-10-CM | POA: Diagnosis not present

## 2022-02-17 ENCOUNTER — Telehealth: Payer: Self-pay | Admitting: Neurology

## 2022-02-17 NOTE — Telephone Encounter (Signed)
Contacted pt back, she stated she has had a migraine since Sunday. The Bernita Raisin did not help, she started [facility] Dec 5th and it did help up until Sunday. She is also taking Tylenol OTC. I asked her if the previous Rx of medrol dosepak helped, she stated yes and actually has one refill available for it. I advised her to fill it and try that and let us know if it does not. She verbally understood and was appreciative.

## 2022-02-17 NOTE — Telephone Encounter (Signed)
Pt states she has had a migraine since Sunday and she is unable to get it under control.  Pt is asking for a call re: what she can do since she is only on the infusion medication.

## 2022-02-23 ENCOUNTER — Telehealth: Payer: Self-pay | Admitting: Neurology

## 2022-02-23 NOTE — Telephone Encounter (Signed)
[employer] approved the NPSG but it will expired before she has the SS. The Josem Kaufmann is [id number] (exp. 01/20/22 to 04/20/22)  I will redo the auth once the appt time gets closer.  She is scheduled for 05/03/22 at 9 pm.  Mailed packet to the patient.

## 2022-03-01 ENCOUNTER — Other Ambulatory Visit: Payer: Medicare PPO

## 2022-03-01 ENCOUNTER — Encounter: Payer: Self-pay | Admitting: *Deleted

## 2022-03-01 ENCOUNTER — Other Ambulatory Visit: Payer: Self-pay | Admitting: Rheumatology

## 2022-03-01 NOTE — Telephone Encounter (Signed)
Next Visit: 04/06/2022   Last Visit: 09/29/2021   Last Fill:  DX: Rheumatoid arthritis of multiple sites with negative rheumatoid factor    Current Dose per office note on 09/29/2021 :If labs are normal we will increase the dose of methotrexate to 8 tablets p.o. weekly. Patient increased dose in September 2023.   Labs: 11/06/2021 Glucose is 125. Rest of CMP WNL. Absolute eosinophils are borderline elevated. Rest of CBC Wnl. We will continue to monitor lab work closely.    Message sent to patient via my chart to advise she is due to update her labs.   Okay to refill MTX?

## 2022-03-03 ENCOUNTER — Encounter: Payer: Medicare PPO | Admitting: Family Medicine

## 2022-03-04 ENCOUNTER — Other Ambulatory Visit: Payer: Self-pay | Admitting: Physician Assistant

## 2022-03-04 DIAGNOSIS — M0609 Rheumatoid arthritis without rheumatoid factor, multiple sites: Secondary | ICD-10-CM

## 2022-03-04 NOTE — Telephone Encounter (Signed)
Next Visit: 04/06/2022   Last Visit: 09/29/2021   Last Fill: 12/04/2021 (PLQ), 02/01/2022 (Tizanidine and Baclofen)    DX: Fibromyalgia Rheumatoid arthritis of multiple sites with negative rheumatoid factor   Current Dose per office note on 09/29/2021: Tizanidine and Baclofen not mentioned.   Plaquenil 200 mg 1 tablet by mouth twice daily Monday through Friday.   Labs: 11/16/2021 Glucose is 125. Rest of CMP WNL. Absolute eosinophils are borderline elevated. Rest of CBC Wnl.   PLQ Eye Exam: 10/07/2021 WNL    Okay to refill baclofen and tizanidine?

## 2022-03-09 ENCOUNTER — Telehealth (HOSPITAL_COMMUNITY): Payer: Self-pay | Admitting: *Deleted

## 2022-03-09 ENCOUNTER — Encounter (HOSPITAL_COMMUNITY): Payer: Self-pay | Admitting: Psychiatry

## 2022-03-09 ENCOUNTER — Ambulatory Visit (HOSPITAL_BASED_OUTPATIENT_CLINIC_OR_DEPARTMENT_OTHER): Payer: Medicare PPO | Admitting: Psychiatry

## 2022-03-09 ENCOUNTER — Other Ambulatory Visit: Payer: Self-pay | Admitting: *Deleted

## 2022-03-09 VITALS — Wt 172.0 lb

## 2022-03-09 DIAGNOSIS — F331 Major depressive disorder, recurrent, moderate: Secondary | ICD-10-CM | POA: Diagnosis not present

## 2022-03-09 DIAGNOSIS — H903 Sensorineural hearing loss, bilateral: Secondary | ICD-10-CM | POA: Diagnosis not present

## 2022-03-09 DIAGNOSIS — J310 Chronic rhinitis: Secondary | ICD-10-CM | POA: Diagnosis not present

## 2022-03-09 DIAGNOSIS — F419 Anxiety disorder, unspecified: Secondary | ICD-10-CM

## 2022-03-09 DIAGNOSIS — J343 Hypertrophy of nasal turbinates: Secondary | ICD-10-CM | POA: Diagnosis not present

## 2022-03-09 DIAGNOSIS — H6983 Other specified disorders of Eustachian tube, bilateral: Secondary | ICD-10-CM | POA: Diagnosis not present

## 2022-03-09 DIAGNOSIS — Z79899 Other long term (current) drug therapy: Secondary | ICD-10-CM

## 2022-03-09 DIAGNOSIS — R0982 Postnasal drip: Secondary | ICD-10-CM | POA: Diagnosis not present

## 2022-03-09 MED ORDER — BUPROPION HCL ER (XL) 150 MG PO TB24: 150.0000 mg | ORAL_TABLET | ORAL | 1 refills | Status: DC

## 2022-03-09 NOTE — Progress Notes (Signed)
[place] Initial Assessment Note  Patient Location:Home Provider Location:Home Office   I connected with Leilani Merl by video and verified that I am talking with correct person using two identifiers.   I discussed the limitations, risks, security and privacy concerns of performing an evaluation and management service virtually and the availability of in person appointments. I also discussed with the patient that there may be a patient responsible charge related to this service. The patient expressed understanding and agreed to proceed.  Christina Lewis [id number] 67 y.o.  03/09/2022 9:00 AM  Chief Complaint:  My doctor referred me. My psychiatrist do not accept my insurance.   History of Present Illness:  Sherelle is 67 year old married, retired female who is referred from her PCP from [facility] for the management of psychiatric symptoms.  Patient was seeing Dr. Toy Care since 2012 but needed to change her provider due to insurance reason.  Patient struggles with anxiety and depression for a while.  She is on Cymbalta 90 mg and doxepin 50 mg at bedtime.  She told taking the Cymbalta since 2012 and there was a time when she was taking 120 mg but last year doxepin was added and dose decreased to 90 mg.  Patient reported the current medicine is not working as she struggle with depression and sometimes she feels that she does not exist.  She feels nonfunctional at home.  She has no motivation, does not enjoy the company and a family gathering and does not care about things.  She is not sure what triggered her the symptoms but reported history of COVID in 2020 and then December 2022 and since then she has noticed chronic fatigue, tiredness.  Her physician did work on "Long Covid" but no concrete answers.  She feels her symptoms are slowly getting worse.  She noticed sometimes getting upset, irritable but mostly towards her husband.  Patient told her husband has  chronic health issues which lately getting worse.  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  Her husband also diagnosed with lung cancer but cannot have treatment because of infection.  Patient told she used to get very emotional but now she does not care about things.  She denies any paranoia, hallucination, panic attack.  She denies any mania or psychosis but reported ruminative thoughts.  She had a close social network and she is very close to her sister and she used to enjoy shopping with her sister but lately has not done.  She does not have a big social group.  Patient lives with her husband who they have been married for while.  They do not have any children.  Patient retired in 2016 due to health conditions.  Recently she had a visit with the neurology to address her sleep apnea.  She was diagnosed with obstructive sleep apnea but need a new sleep study and she is scheduled in March at [facility] neurology.  Patient denies drinking or using any illegal substances.  There is no significant weight loss or weight gain.  Patient currently not in any therapy but interested to look into it.  Patient denies any history of suicidal attempt, inpatient psychiatric treatment.  She recall briefly tried Prozac and Lexapro many years ago but do not remember the details.  Patient denies any history of abuse.  Past Psychiatric History: History of depression in 1990 and given Prozac and Lexapro by PCP.  Started seeing psychiatrist in 2012 by Dr Toy Care and prescribed Cymbalta.  Dose increased to 120 mg and last year doxepin was added.  No history of suicidal attempt, inpatient treatment, psychosis, mania, abuse, legal issues, seizures, TBI.     Past Medical History:  Diagnosis Date   Allergy    Anxiety    Arthritis    Depression    Diabetes mellitus    Fibromyalgia    GERD (gastroesophageal reflux disease)    Gout    Hematuria    History of kidney stones    History of tics    Ineffective  esophageal motility    Interstitial cystitis 2016   Migraines    Osteoarthritis    PCOS (polycystic ovarian syndrome) 1993   Pneumonia 2019   PONV (postoperative nausea and vomiting)    Sleep apnea    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, took medication for a year   Thyroid nodule      Work History; Patient is a retired as a Education officer, [employer] in 2016.  Psychosocial History; Patient born and raised in [place].  She has been married for 37 years.  She has no children.  She do not have big social network other than close to her sister who live close by.  Legal History; No history of legal issues.  History Of Abuse; No history of abuse.  Substance Abuse History; Denies any history of alcohol or substance use.  Neurologic: Headache: Yes Seizure: No Paresthesias: No   Outpatient Encounter Medications as of 03/09/2022  Medication Sig   acetaminophen (TYLENOL) 650 MG CR tablet Take 1,300 mg by mouth every 8 (eight) hours as needed for pain.   aspirin EC 81 MG tablet Take 81 mg by mouth daily. Swallow whole.   baclofen (LIORESAL) 10 MG tablet TAKE 1 TABLET EVERY MORNING AND 1 TABLET AT NOON.   cetirizine (ZYRTEC) 10 MG tablet Take 10 mg by mouth daily.   cholecalciferol (VITAMIN D) 25 MCG (1000 UNIT) tablet Take 1,000 Units by mouth daily.   diclofenac Sodium (VOLTAREN) 1 % GEL APPLY 2 TO 4 GRAMS TO AFFECTED JOINTS UP TO 4 TIMES DAILY AS NEEDED. (Patient taking differently: Apply 2 g topically 4 (four) times daily as needed (joint pain).)   doxepin (SINEQUAN) 50 MG capsule Take 50 mg by mouth at bedtime.   DULoxetine (CYMBALTA) 30 MG capsule Take 30 mg by mouth daily.   DULoxetine (CYMBALTA) 60 MG capsule Take 60 mg by mouth daily.   ferrous sulfate 325 (65 FE) MG tablet Take 325 mg by mouth daily.   fluticasone (FLONASE ALLERGY RELIEF) 50 MCG/ACT nasal spray Place 1 spray into both nostrils in the morning and at bedtime.   folic acid (FOLVITE) 1 MG tablet  Take 2 tablets (2 mg total) by mouth daily.   gabapentin (NEURONTIN) 300 MG capsule Take 300-600 mg by mouth See admin instructions. 600 mg in the morning, 300 mg at NOON, and 600 mg at bedtime   Reports taking only at night   hydroxychloroquine (PLAQUENIL) 200 MG tablet TAKE 1 TABLET TWICE DAILY MONDAY THRU FRIDAY FOR RHEUMATOID ARTHRTITIS.   ipratropium (ATROVENT) 0.06 % nasal spray Place 1 spray into both nostrils in the morning and at bedtime. (Patient not taking: Reported on 02/09/2022)   MAGNESIUM PO Take 500 mg by mouth at bedtime.   meclizine (ANTIVERT) 25 MG tablet Take 1 tablet (25 mg total) by mouth 3 (three) times daily as needed for dizziness.   methotrexate (RHEUMATREX) 2.5 MG tablet TAKE 8 TABLETS WEEKLY --  PROTECT FROM LIGHT   Multiple Vitamin (MULTIVITAMIN WITH MINERALS) TABS tablet Take 1 tablet by mouth daily.   omeprazole (PRILOSEC) 40 MG capsule Take 40 mg by mouth daily.   ondansetron (ZOFRAN-ODT) 8 MG disintegrating tablet Take 8 mg by mouth every 8 (eight) hours as needed for nausea.    tiZANidine (ZANAFLEX) 4 MG tablet TAKE 1 TABLET AT BEDTIME AS NEEDED   TURMERIC-GINGER PO Take 1 tablet by mouth daily.   Ubrogepant (UBRELVY) 100 MG TABS Take 100 mg by mouth every 2 (two) hours as needed. Maximum 200mg  a day. (Patient not taking: Reported on 02/09/2022)   No facility-administered encounter medications on file as of 03/09/2022.    No results found for this or any previous visit (from the past 2160 hour(s)).    Constitutional:  There were no vitals taken for this visit.   Musculoskeletal: Strength & Muscle Tone: within normal limits Gait & Station: normal Patient leans: N/A  Psychiatric Specialty Exam: Physical Exam  ROS  Weight 172 lb (78 kg).There is no height or weight on file to calculate BMI.  General Appearance: Casual  Eye Contact:  Fair  Speech:  Normal Rate  Volume:  Decreased  Mood:  Depressed, Dysphoric, and tired  Affect:  Constricted   Thought Process:  Goal Directed  Orientation:  Full (Time, Place, and Person)  Thought Content:  Rumination  Suicidal Thoughts:  No  Homicidal Thoughts:  No  Memory:  Immediate;   Good Recent;   Fair Remote;   Good  Judgement:  Intact  Insight:  Present  Psychomotor Activity:  Normal  Concentration:  Concentration: Good and Attention Span: Fair  Recall:  Good  Fund of Knowledge:  Good  Language:  Good  Akathisia:  No  Handed:  Right  AIMS (if indicated):     Assets:  Communication Skills Desire for Improvement Housing Social Support Transportation  ADL's:  Intact  Cognition:  WNL  Sleep:        Assessment/Plan:  Patient is 67 year old retired married female who was referred from PCP.  I review current symptoms, history, current medication, blood work results.  PHQ 7, GAD 2.Patient has multiple health issues including headache, fibromyalgia, osteoarthritis, PCO and sleep apnea.  Discuss upcoming sleep study in March.  Patient do not feel the current medicine working.  I recommend consider trying low-dose Wellbutrin XL 150 mg in the morning and cut down the Cymbalta to only 30 mg.  Keep the doxepin 50 mg at bedtime.  I also discussed about genetic testing and considering therapy.  Patient agree with the plan.  We will refer her for therapy and patient will come to the office for genetic testing.  She has enough refill for Cymbalta and doxepin but like to have refills in the future from our office.  I explained possible side effects of Wellbutrin including in the beginning can cause headaches, tremors, shakes however if symptoms persist and then need to call us back.  Discussed safety concerns and any time having active suicidal thoughts or homicidal thought then she need to call 911 or go to local emergency room.  I will follow up in 3 to 4 weeks.  Kathlee Nations, MD 03/09/2022    Follow Up Instructions: I discussed the assessment and treatment plan with the patient. The patient  was provided an opportunity to ask questions and all were answered. The patient agreed with the plan and demonstrated an understanding of the instructions.   The patient was advised  to call back or seek an in-person evaluation if the symptoms worsen or if the condition fails to improve as anticipated.   Collaboration of Care: Primary Care Provider AEB notes are available in epic to review.   Patient/Guardian was advised Release of Information must be obtained prior to any record release in order to collaborate their care with an outside provider. Patient/Guardian was advised if they have not already done so to contact the registration department to sign all necessary forms in order for [place] to release information regarding their care.    Consent: Patient/Guardian gives verbal consent for treatment and assignment of benefits for services provided during this visit. Patient/Guardian expressed understanding and agreed to proceed.     I provided 62 minutes of non-face-to-face time during this encounter.

## 2022-03-09 NOTE — Telephone Encounter (Signed)
Writer called pt to schedule appointment for GeneSight testing. Writer had to LVM advising pt to call front desk, [phone], to schedule a nurse visit. Office hours given.

## 2022-03-10 ENCOUNTER — Telehealth (HOSPITAL_COMMUNITY): Payer: Self-pay | Admitting: *Deleted

## 2022-03-10 ENCOUNTER — Ambulatory Visit (HOSPITAL_COMMUNITY): Payer: Medicare PPO | Admitting: *Deleted

## 2022-03-10 LAB — CBC WITH DIFFERENTIAL/PLATELET
Absolute Monocytes: 787 cells/uL (ref 200–950)
Basophils Absolute: 137 cells/uL (ref 0–200)
Basophils Relative: 1.2 %
Eosinophils Absolute: 410 cells/uL (ref 15–500)
Eosinophils Relative: 3.6 %
HCT: 41.7 % (ref 35.0–45.0)
Hemoglobin: 14.1 g/dL (ref 11.7–15.5)
Lymphs Abs: 3751 cells/uL (ref 850–3900)
MCH: 31.9 pg (ref 27.0–33.0)
MCHC: 33.8 g/dL (ref 32.0–36.0)
MCV: 94.3 fL (ref 80.0–100.0)
MPV: 10 fL (ref 7.5–12.5)
Monocytes Relative: 6.9 %
Neutro Abs: 6316 cells/uL (ref 1500–7800)
Neutrophils Relative %: 55.4 %
Platelets: 429 10*3/uL — ABNORMAL HIGH (ref 140–400)
RBC: 4.42 10*6/uL (ref 3.80–5.10)
RDW: 13.3 % (ref 11.0–15.0)
Total Lymphocyte: 32.9 %
WBC: 11.4 10*3/uL — ABNORMAL HIGH (ref 3.8–10.8)

## 2022-03-10 LAB — COMPLETE METABOLIC PANEL WITH GFR
AG Ratio: 2 (calc) (ref 1.0–2.5)
ALT: 23 U/L (ref 6–29)
AST: 21 U/L (ref 10–35)
Albumin: 4.8 g/dL (ref 3.6–5.1)
Alkaline phosphatase (APISO): 61 U/L (ref 37–153)
BUN: 18 mg/dL (ref 7–25)
CO2: 27 mmol/L (ref 20–32)
Calcium: 10.5 mg/dL — ABNORMAL HIGH (ref 8.6–10.4)
Chloride: 104 mmol/L (ref 98–110)
Creat: 0.87 mg/dL (ref 0.50–1.05)
Globulin: 2.4 g/dL (calc) (ref 1.9–3.7)
Glucose, Bld: 101 mg/dL — ABNORMAL HIGH (ref 65–99)
Potassium: 4.5 mmol/L (ref 3.5–5.3)
Sodium: 140 mmol/L (ref 135–146)
Total Bilirubin: 0.2 mg/dL (ref 0.2–1.2)
Total Protein: 7.2 g/dL (ref 6.1–8.1)
eGFR: 74 mL/min/{1.73_m2} (ref >=60)

## 2022-03-10 NOTE — Telephone Encounter (Signed)
Pt in office today for GeneSight testing. Consent singed, insurance information verified. Sample to pick up today by [employer].

## 2022-03-10 NOTE — Telephone Encounter (Signed)
Opened in error

## 2022-03-10 NOTE — Progress Notes (Signed)
CBC and CMP are stable.  Calcium is mildly elevated.  Patient should avoid calcium supplement.

## 2022-03-11 ENCOUNTER — Ambulatory Visit (HOSPITAL_COMMUNITY): Payer: Medicare PPO | Admitting: Clinical

## 2022-03-17 ENCOUNTER — Other Ambulatory Visit: Payer: Medicare PPO

## 2022-03-23 NOTE — Progress Notes (Deleted)
Office Visit Note  Patient: Christina Lewis             Date of Birth: 1955/10/10           MRN: [id number]             PCP: Baruch Gouty, FNP Referring: Monico Blitz, MD Visit Date: 04/06/2022 Occupation: @GUAROCC @  Subjective:  No chief complaint on file.   History of Present Illness: Christina Lewis is a 67 y.o. female ***     Activities of Daily Living:  Patient reports morning stiffness for *** {minute/hour:19697}.   Patient {ACTIONS;DENIES/REPORTS:21021675::"Denies"} nocturnal pain.  Difficulty dressing/grooming: {ACTIONS;DENIES/REPORTS:21021675::"Denies"} Difficulty climbing stairs: {ACTIONS;DENIES/REPORTS:21021675::"Denies"} Difficulty getting out of chair: {ACTIONS;DENIES/REPORTS:21021675::"Denies"} Difficulty using hands for taps, buttons, cutlery, and/or writing: {ACTIONS;DENIES/REPORTS:21021675::"Denies"}  No Rheumatology ROS completed.   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   Vitamin D deficiency 10/15/2021   Polycystic ovaries 10/15/2021   Migraine without aura and without status migrainosus, not intractable 10/15/2021   BMI 29.0-29.9,adult 10/15/2021   Cellulitis 03/04/2021   Rheumatoid arthritis ([place]) 03/04/2021   Diabetes mellitus type 2 in obese ([place]) 03/04/2021   Right foot infection 03/04/2021   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 myelopathy 09/11/2019   Leukocytosis 11/04/2016   Fibromyalgia 02/10/2016   Other fatigue 02/10/2016   Primary osteoarthritis of both hands 02/10/2016   History of migraine 02/10/2016   History of thyroid nodule 02/10/2016   History of sleep apnea 02/10/2016   History of renal calculi 02/10/2016   Pain in joint, shoulder region 10/02/2010   Adhesive capsulitis of shoulder 10/02/2010   Muscle weakness  (generalized) 10/02/2010    Past Medical History:  Diagnosis Date   Allergy    Anxiety    Arthritis    Depression    Diabetes mellitus    Fibromyalgia    GERD (gastroesophageal reflux disease)    Gout    Hematuria    History of kidney stones    History of tics    Ineffective esophageal motility    Interstitial cystitis 2016   Migraines    Osteoarthritis    PCOS (polycystic ovarian syndrome) 1993   Pneumonia 2019   PONV (postoperative nausea and vomiting)    Sleep apnea    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, took medication for a year   Thyroid nodule     Family History  Problem Relation Age of Onset   Fibromyalgia Mother    Psoriasis Mother        psoriatic arthritis    Diabetes Mother    Heart disease Mother    Psoriasis Sister        psoriatic arthritis    Diabetes Sister    Migraines Sister    Rheum arthritis Sister    Fibromyalgia Sister    Diabetes Sister    Past Surgical History:  Procedure Laterality Date   ABDOMINAL HYSTERECTOMY  2000   APPENDECTOMY  1976   BALLOON DILATION N/A 11/03/2021   Procedure: Larrie Kass DILATION;  Surgeon: Ronnette Juniper, MD;  Location: [facility];  Service: Gastroenterology;  Laterality: N/A;   BOTOX INJECTION N/A 11/03/2021   Procedure: BOTOX INJECTION;  Surgeon: Ronnette Juniper, MD;  Location: [facility];  Service: Gastroenterology;  Laterality: N/A;   BREAST LUMPECTOMY  1989  lt-negative   CARDIOVASCULAR STRESS TEST  2000   CATARACT EXTRACTION W/PHACO Left 12/07/2019   Procedure: CATARACT EXTRACTION PHACO AND INTRAOCULAR LENS PLACEMENT (IOC);  Surgeon: Baruch Goldmann, MD;  Location: [facility];  Service: Ophthalmology;  Laterality: Left;  CDE: 5.55   CHOLECYSTECTOMY  1985   COLONOSCOPY     ESOPHAGEAL MANOMETRY N/A 08/24/2021   Procedure: ESOPHAGEAL MANOMETRY (EM);  Surgeon: Ronnette Juniper, MD;  Location: [facility];  Service: Gastroenterology;  Laterality: N/A;   ESOPHAGOGASTRODUODENOSCOPY N/A 11/03/2021    Procedure: ESOPHAGOGASTRODUODENOSCOPY (EGD);  Surgeon: Ronnette Juniper, MD;  Location: [facility];  Service: Gastroenterology;  Laterality: N/A;   HYSTERECTOMY ABDOMINAL WITH SALPINGECTOMY Bilateral 2000   KIDNEY STONE SURGERY Right 2014   with stent placement in OR   KNEE ARTHROSCOPY Right 08/22/2013   Procedure: ARTHROSCOPY RIGHT KNEE FOR INFECTION LAVAGE AND DRAINAGE;  Surgeon: Yvette Rack., MD;  Location: [facility];  Service: Orthopedics;  Laterality: Right;   KNEE ARTHROSCOPY WITH PATELLA RECONSTRUCTION Right 08/06/2013   Procedure: RIGHT KNEE ARTHROSCOPY WITH MENISCECTOMY MEDIAL, ARTHROSCOPY KNEE WITH DEBRIDEMENT/SHAVING (CHONDROPLASTY) ;  Surgeon: Yvette Rack., MD;  Location: [facility];  Service: Orthopedics;  Laterality: Right;   LUMBAR LAMINECTOMY/DECOMPRESSION MICRODISCECTOMY Right 09/11/2019   Procedure: Right Lumbar Three-Four Microdiscectomy;  Surgeon: Erline Levine, MD;  Location: [facility];  Service: Neurosurgery;  Laterality: Right;  Right Lumbar Three-Four Microdiscectomy   NASAL SINUS SURGERY     x4   OOPHORECTOMY     SHOULDER SURGERY  2011   right shoulder   THYROID SURGERY  1994   TONSILLECTOMY Bilateral 1974   Social History   Social History Narrative   Caffiene 4-5 12 oz cans soda.   Working retired - special ed.   Live with home no kids   Immunization History  Administered Date(s) Administered   Tdap 03/04/2021   Zoster Recombinat (Shingrix) 10/15/2021     Objective: Vital Signs: There were no vitals taken for this visit.   Physical Exam   Musculoskeletal Exam: ***  CDAI Exam: CDAI Score: -- Patient Global: --; Provider Global: -- Swollen: --; Tender: -- Joint Exam 04/06/2022   No joint exam has been documented for this visit   There is currently no information documented on the homunculus. Go to the Rheumatology activity and complete the homunculus joint exam.  Investigation: No additional findings.  Imaging: No  results found.  Recent Labs: Lab Results  Component Value Date   WBC 11.4 (H) 03/09/2022   HGB 14.1 03/09/2022   PLT 429 (H) 03/09/2022   NA 140 03/09/2022   K 4.5 03/09/2022   CL 104 03/09/2022   CO2 27 03/09/2022   GLUCOSE 101 (H) 03/09/2022   BUN 18 03/09/2022   CREATININE 0.87 03/09/2022   BILITOT 0.2 03/09/2022   ALKPHOS 82 10/15/2021   AST 21 03/09/2022   ALT 23 03/09/2022   PROT 7.2 03/09/2022   ALBUMIN 4.5 10/15/2021   CALCIUM 10.5 (H) 03/09/2022   GFRAA 98 03/24/2020   QFTBGOLDPLUS NEGATIVE 09/29/2021    Speciality Comments: PLQ Eye Exam: 10/07/2021 WNL @ [facility]  f/u 01/2022  Procedures:  No procedures performed Allergies: Sulfa antibiotics, Codeine, Erythromycin, Metronidazole, and Morphine and related   Assessment / Plan:     Visit Diagnoses: No diagnosis found.  Orders: No orders of the defined types were placed in this encounter.  No orders of the defined types were placed in this encounter.   Face-to-face time spent  with patient was *** minutes. Greater than 50% of time was spent in counseling and coordination of care.  Follow-Up Instructions: No follow-ups on file.   Earnestine Mealing, CMA  Note - This record has been created using Editor, commissioning.  Chart creation errors have been sought, but may not always  have been located. Such creation errors do not reflect on  the standard of medical care.

## 2022-03-25 ENCOUNTER — Ambulatory Visit (HOSPITAL_COMMUNITY): Payer: Medicare PPO | Admitting: Clinical

## 2022-03-31 ENCOUNTER — Other Ambulatory Visit: Payer: Medicare PPO

## 2022-04-01 NOTE — Telephone Encounter (Signed)
Updated [employer] auth:  [employer]- [employer] Josem Kaufmann: [id number] (exp. 05/03/22 to 08/01/22)   Patient is scheduled at [facility] for 05/03/22.

## 2022-04-05 ENCOUNTER — Other Ambulatory Visit: Payer: Self-pay | Admitting: Rheumatology

## 2022-04-05 NOTE — Telephone Encounter (Signed)
Next Visit: 04/06/2022   Last Visit: 09/29/2021   Last Fill: 03/04/2022  DX: Fibromyalgia   Current Dose per office note on 09/29/2021: Tizanidine and Baclofen not mentioned.    Okay to refill Tizanidine and Baclofen?

## 2022-04-06 ENCOUNTER — Ambulatory Visit: Payer: Medicare PPO | Admitting: Rheumatology

## 2022-04-06 ENCOUNTER — Telehealth (HOSPITAL_COMMUNITY): Payer: Medicare PPO | Admitting: Psychiatry

## 2022-04-06 ENCOUNTER — Telehealth: Payer: Self-pay | Admitting: Neurology

## 2022-04-06 ENCOUNTER — Encounter (HOSPITAL_COMMUNITY): Payer: Self-pay

## 2022-04-06 DIAGNOSIS — M1711 Unilateral primary osteoarthritis, right knee: Secondary | ICD-10-CM

## 2022-04-06 DIAGNOSIS — G8929 Other chronic pain: Secondary | ICD-10-CM

## 2022-04-06 DIAGNOSIS — M797 Fibromyalgia: Secondary | ICD-10-CM

## 2022-04-06 DIAGNOSIS — Z8669 Personal history of other diseases of the nervous system and sense organs: Secondary | ICD-10-CM

## 2022-04-06 DIAGNOSIS — Z840 Family history of diseases of the skin and subcutaneous tissue: Secondary | ICD-10-CM

## 2022-04-06 DIAGNOSIS — M19071 Primary osteoarthritis, right ankle and foot: Secondary | ICD-10-CM

## 2022-04-06 DIAGNOSIS — Z8719 Personal history of other diseases of the digestive system: Secondary | ICD-10-CM

## 2022-04-06 DIAGNOSIS — M0609 Rheumatoid arthritis without rheumatoid factor, multiple sites: Secondary | ICD-10-CM

## 2022-04-06 DIAGNOSIS — Z79899 Other long term (current) drug therapy: Secondary | ICD-10-CM

## 2022-04-06 DIAGNOSIS — Z87442 Personal history of urinary calculi: Secondary | ICD-10-CM

## 2022-04-06 DIAGNOSIS — M7061 Trochanteric bursitis, right hip: Secondary | ICD-10-CM

## 2022-04-06 DIAGNOSIS — Z8742 Personal history of other diseases of the female genital tract: Secondary | ICD-10-CM

## 2022-04-06 DIAGNOSIS — Z8639 Personal history of other endocrine, nutritional and metabolic disease: Secondary | ICD-10-CM

## 2022-04-06 DIAGNOSIS — Z8261 Family history of arthritis: Secondary | ICD-10-CM

## 2022-04-06 DIAGNOSIS — M19042 Primary osteoarthritis, left hand: Secondary | ICD-10-CM

## 2022-04-06 DIAGNOSIS — M5030 Other cervical disc degeneration, unspecified cervical region: Secondary | ICD-10-CM

## 2022-04-06 DIAGNOSIS — M79641 Pain in right hand: Secondary | ICD-10-CM

## 2022-04-06 DIAGNOSIS — Z9289 Personal history of other medical treatment: Secondary | ICD-10-CM

## 2022-04-06 DIAGNOSIS — M5136 Other intervertebral disc degeneration, lumbar region: Secondary | ICD-10-CM

## 2022-04-06 DIAGNOSIS — Z8659 Personal history of other mental and behavioral disorders: Secondary | ICD-10-CM

## 2022-04-06 DIAGNOSIS — R5383 Other fatigue: Secondary | ICD-10-CM

## 2022-04-06 DIAGNOSIS — Z872 Personal history of diseases of the skin and subcutaneous tissue: Secondary | ICD-10-CM

## 2022-04-06 NOTE — Progress Notes (Deleted)
Office Visit Note  Patient: Christina Lewis             Date of Birth: 07/05/1955           MRN: [id number]             PCP: Baruch Gouty, FNP Referring: Monico Blitz, MD Visit Date: 04/13/2022 Occupation: @GUAROCC$ @  Subjective:  Medication monitoring   History of Present Illness: Christina Lewis is a 67 y.o. female with history of seronegative rheumatoid arthritis and osteoarthritis.  Patient is taking plaquenil 200 mg 1 tablet by mouth twice daily Monday through Friday, methotrexate 8 tablets by mouth once weekly, and folic acid 2 mg daily.    Ultrasound of both hands positive for synovitis on 09/29/21.  Patient was started on methotrexate after her last office visit on 09/29/21.   CBC and CMP updated on 03/09/21. Her next lab work will be due in April and every 3 months.   Discussed the importance of holding methotrexate if she develops signs or symptoms of an infection and to resume once the infection has completely cleared.   Activities of Daily Living:  Patient reports morning stiffness for *** {minute/hour:19697}.   Patient {ACTIONS;DENIES/REPORTS:21021675::"Denies"} nocturnal pain.  Difficulty dressing/grooming: {ACTIONS;DENIES/REPORTS:21021675::"Denies"} Difficulty climbing stairs: {ACTIONS;DENIES/REPORTS:21021675::"Denies"} Difficulty getting out of chair: {ACTIONS;DENIES/REPORTS:21021675::"Denies"} Difficulty using hands for taps, buttons, cutlery, and/or writing: {ACTIONS;DENIES/REPORTS:21021675::"Denies"}  No Rheumatology ROS completed.   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   Vitamin D deficiency 10/15/2021   Polycystic ovaries 10/15/2021   Migraine without aura and without status migrainosus, not intractable 10/15/2021   BMI 29.0-29.9,adult  10/15/2021   Cellulitis 03/04/2021   Rheumatoid arthritis ([place]) 03/04/2021   Diabetes mellitus type 2 in obese ([place]) 03/04/2021   Right foot infection 03/04/2021   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 myelopathy 09/11/2019   Leukocytosis 11/04/2016   Fibromyalgia 02/10/2016   Other fatigue 02/10/2016   Primary osteoarthritis of both hands 02/10/2016   History of migraine 02/10/2016   History of thyroid nodule 02/10/2016   History of sleep apnea 02/10/2016   History of renal calculi 02/10/2016   Pain in joint, shoulder region 10/02/2010   Adhesive capsulitis of shoulder 10/02/2010   Muscle weakness (generalized) 10/02/2010    Past Medical History:  Diagnosis Date   Allergy    Anxiety    Arthritis    Depression    Diabetes mellitus    Fibromyalgia    GERD (gastroesophageal reflux disease)    Gout    Hematuria    History of kidney stones    History of tics    Ineffective esophageal motility    Interstitial cystitis 2016   Migraines    Osteoarthritis    PCOS (polycystic ovarian syndrome) 1993   Pneumonia 2019   PONV (postoperative nausea and vomiting)    Sleep apnea    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, took medication for a year   Thyroid nodule     Family History  Problem Relation Age of Onset   Fibromyalgia Mother    Psoriasis Mother        psoriatic arthritis    Diabetes Mother    Heart disease Mother    Psoriasis Sister        psoriatic arthritis  Diabetes Sister    Migraines Sister    Rheum arthritis Sister    Fibromyalgia Sister    Diabetes Sister    Past Surgical History:  Procedure Laterality Date   ABDOMINAL HYSTERECTOMY  2000   APPENDECTOMY  1976   BALLOON DILATION N/A 11/03/2021   Procedure: [place];  Surgeon: Ronnette Juniper, MD;  Location: [facility];  Service: Gastroenterology;  Laterality: N/A;   BOTOX INJECTION N/A 11/03/2021   Procedure: BOTOX INJECTION;  Surgeon: Ronnette Juniper, MD;  Location: [facility];  Service:  Gastroenterology;  Laterality: N/A;   BREAST LUMPECTOMY  1989   lt-negative   CARDIOVASCULAR STRESS TEST  2000   CATARACT EXTRACTION W/PHACO Left 12/07/2019   Procedure: CATARACT EXTRACTION PHACO AND INTRAOCULAR LENS PLACEMENT (IOC);  Surgeon: Baruch Goldmann, MD;  Location: [facility];  Service: Ophthalmology;  Laterality: Left;  CDE: 5.55   CHOLECYSTECTOMY  1985   COLONOSCOPY     ESOPHAGEAL MANOMETRY N/A 08/24/2021   Procedure: ESOPHAGEAL MANOMETRY (EM);  Surgeon: Ronnette Juniper, MD;  Location: [facility];  Service: Gastroenterology;  Laterality: N/A;   ESOPHAGOGASTRODUODENOSCOPY N/A 11/03/2021   Procedure: ESOPHAGOGASTRODUODENOSCOPY (EGD);  Surgeon: Ronnette Juniper, MD;  Location: [facility];  Service: Gastroenterology;  Laterality: N/A;   HYSTERECTOMY ABDOMINAL WITH SALPINGECTOMY Bilateral 2000   KIDNEY STONE SURGERY Right 2014   with stent placement in OR   KNEE ARTHROSCOPY Right 08/22/2013   Procedure: ARTHROSCOPY RIGHT KNEE FOR INFECTION LAVAGE AND DRAINAGE;  Surgeon: Yvette Rack., MD;  Location: [facility];  Service: Orthopedics;  Laterality: Right;   KNEE ARTHROSCOPY WITH PATELLA RECONSTRUCTION Right 08/06/2013   Procedure: RIGHT KNEE ARTHROSCOPY WITH MENISCECTOMY MEDIAL, ARTHROSCOPY KNEE WITH DEBRIDEMENT/SHAVING (CHONDROPLASTY) ;  Surgeon: Yvette Rack., MD;  Location: [place];  Service: Orthopedics;  Laterality: Right;   LUMBAR LAMINECTOMY/DECOMPRESSION MICRODISCECTOMY Right 09/11/2019   Procedure: Right Lumbar Three-Four Microdiscectomy;  Surgeon: Erline Levine, MD;  Location: [place];  Service: Neurosurgery;  Laterality: Right;  Right Lumbar Three-Four Microdiscectomy   NASAL SINUS SURGERY     x4   OOPHORECTOMY     SHOULDER SURGERY  2011   right shoulder   THYROID SURGERY  1994   TONSILLECTOMY Bilateral 1974   Social History   Social History Narrative   Caffiene 4-5 12 oz cans soda.   Working retired - special ed.   Live with home no kids    Immunization History  Administered Date(s) Administered   Tdap 03/04/2021   Zoster Recombinat (Shingrix) 10/15/2021     Objective: Vital Signs: There were no vitals taken for this visit.   Physical Exam Vitals and nursing note reviewed.  Constitutional:      Appearance: She is well-developed.  HENT:     Head: Normocephalic and atraumatic.  Eyes:     Conjunctiva/sclera: Conjunctivae normal.  Cardiovascular:     Rate and Rhythm: Normal rate and regular rhythm.     Heart sounds: Normal heart sounds.  Pulmonary:     Effort: Pulmonary effort is normal.     Breath sounds: Normal breath sounds.  Abdominal:     General: Bowel sounds are normal.     Palpations: Abdomen is soft.  Musculoskeletal:     Cervical back: Normal range of motion.  Skin:    General: Skin is warm and dry.     Capillary Refill: Capillary refill takes less than 2 seconds.  Neurological:     Mental Status: She is alert and oriented to person, place,  and time.  Psychiatric:        Behavior: Behavior normal.      Musculoskeletal Exam: ***  CDAI Exam: CDAI Score: -- Patient Global: --; Provider Global: -- Swollen: --; Tender: -- Joint Exam 04/13/2022   No joint exam has been documented for this visit   There is currently no information documented on the homunculus. Go to the Rheumatology activity and complete the homunculus joint exam.  Investigation: No additional findings.  Imaging: No results found.  Recent Labs: Lab Results  Component Value Date   WBC 11.4 (H) 03/09/2022   HGB 14.1 03/09/2022   PLT 429 (H) 03/09/2022   NA 140 03/09/2022   K 4.5 03/09/2022   CL 104 03/09/2022   CO2 27 03/09/2022   GLUCOSE 101 (H) 03/09/2022   BUN 18 03/09/2022   CREATININE 0.87 03/09/2022   BILITOT 0.2 03/09/2022   ALKPHOS 82 10/15/2021   AST 21 03/09/2022   ALT 23 03/09/2022   PROT 7.2 03/09/2022   ALBUMIN 4.5 10/15/2021   CALCIUM 10.5 (H) 03/09/2022   GFRAA 98 03/24/2020   QFTBGOLDPLUS  NEGATIVE 09/29/2021    Speciality Comments: PLQ Eye Exam: 10/07/2021 WNL @ [facility]  f/u 01/2022  Procedures:  No procedures performed Allergies: Sulfa antibiotics, Codeine, Erythromycin, Metronidazole, and Morphine and related   Assessment / Plan:     Visit Diagnoses: Rheumatoid arthritis of multiple sites with negative rheumatoid factor (HCC)  High risk medication use  Primary osteoarthritis of both hands  Primary osteoarthritis of right knee  Primary osteoarthritis of both feet  Trochanteric bursitis of right hip  Other fatigue  DDD (degenerative disc disease), cervical  DDD (degenerative disc disease), lumbar  Chronic SI joint pain  Fibromyalgia  Family history of rheumatoid arthritis-Sister  Family history of psoriatic arthritis-mother and sister  History of tics  History of diabetes mellitus  History of sleep apnea  History of thyroid nodule  History of gastroesophageal reflux (GERD)  History of renal calculi  History of depression  History of cellulitis  History of positive PPD  History of migraine  History of PCOS  Orders: No orders of the defined types were placed in this encounter.  No orders of the defined types were placed in this encounter.   Face-to-face time spent with patient was *** minutes. Greater than 50% of time was spent in counseling and coordination of care.  Follow-Up Instructions: No follow-ups on file.   Ofilia Neas, PA-C  Note - This record has been created using Dragon software.  Chart creation errors have been sought, but may not always  have been located. Such creation errors do not reflect on  the standard of medical care.

## 2022-04-06 NOTE — Telephone Encounter (Signed)
Pt is calling. Stated she has had a migraine headache for three week. Pt is asking if Dr. Jaynee Eagles can call her something in to the pharmacy to break this headache. Pt is requesting a call back

## 2022-04-06 NOTE — Telephone Encounter (Signed)
Jillian/Angel -Can any of the NPs see her this week she should be seen if she has had a migraine for weeks,   Pod 4: Also, do we have her vyepti up to 300?

## 2022-04-06 NOTE — Telephone Encounter (Signed)
I called pt.  She has had a ongoing migraine for the last 3 wks.  On and off (mostly on).  Has taken prednisone dosepack which said did not help.  Her triggers are weather and stress (which both of these are bad right now).  Level worse has been 7.  Ubrelvy samples that she was given when in for appt did not help.  Vyepti she is due  March 5, she feels like that has helped her.  Previously she has had infusion of DHE.  Has upcoming sleep study 04/2022.  Is there anything else that may help her?   I relayed may be calling her back tomorrow, she was ok with that.

## 2022-04-07 NOTE — Telephone Encounter (Signed)
I called pt and relayed that had opening tomorrow to see MM/NP at 1000 for her migraines (per Dr. Jaynee Eagles).  Pt to call back to confirm.

## 2022-04-07 NOTE — Telephone Encounter (Signed)
Sent msg informing pt of appointment made with Sarah for 04/13/22 at 8:45am

## 2022-04-07 NOTE — Telephone Encounter (Signed)
I called and LMVM for pt to trying to reach her about appt.  She is to call back.

## 2022-04-08 ENCOUNTER — Encounter: Payer: Self-pay | Admitting: Adult Health

## 2022-04-08 ENCOUNTER — Ambulatory Visit: Payer: Medicare PPO | Admitting: Adult Health

## 2022-04-08 VITALS — BP 122/77 | HR 79 | Ht 64.0 in | Wt 169.0 lb

## 2022-04-08 DIAGNOSIS — G4733 Obstructive sleep apnea (adult) (pediatric): Secondary | ICD-10-CM

## 2022-04-08 DIAGNOSIS — G43109 Migraine with aura, not intractable, without status migrainosus: Secondary | ICD-10-CM | POA: Diagnosis not present

## 2022-04-08 MED ORDER — NURTEC 75 MG PO TBDP: ORAL_TABLET | ORAL | 0 refills | Status: DC

## 2022-04-08 MED ORDER — NURTEC 75 MG PO TBDP: ORAL_TABLET | ORAL | 11 refills | Status: DC

## 2022-04-08 NOTE — Patient Instructions (Signed)
Your Plan: Try nurtec for abortive therapy If your symptoms worsen or you develop new symptoms please let us know.       Thank you for coming to see [place] at [facility]. I hope we have been able to provide you high quality care today.  You may receive a patient satisfaction survey over the next few weeks. We would appreciate your feedback and comments so that we may continue to improve ourselves and the health of our patients.
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
## 2022-04-09 ENCOUNTER — Encounter (HOSPITAL_COMMUNITY): Payer: Self-pay | Admitting: Psychiatry

## 2022-04-09 ENCOUNTER — Telehealth (HOSPITAL_BASED_OUTPATIENT_CLINIC_OR_DEPARTMENT_OTHER): Payer: Medicare PPO | Admitting: Psychiatry

## 2022-04-09 ENCOUNTER — Other Ambulatory Visit (HOSPITAL_COMMUNITY): Payer: Self-pay | Admitting: *Deleted

## 2022-04-09 VITALS — Wt 169.0 lb

## 2022-04-09 DIAGNOSIS — F331 Major depressive disorder, recurrent, moderate: Secondary | ICD-10-CM

## 2022-04-09 DIAGNOSIS — F419 Anxiety disorder, unspecified: Secondary | ICD-10-CM

## 2022-04-09 MED ORDER — VILAZODONE HCL 20 MG PO TABS: 20.0000 mg | ORAL_TABLET | Freq: Every day | ORAL | 0 refills | Status: DC

## 2022-04-09 MED ORDER — VILAZODONE HCL 10 & 20 MG PO KIT: PACK | ORAL | 0 refills | Status: DC

## 2022-04-09 MED ORDER — VILAZODONE HCL 10 MG PO TABS: 10.0000 mg | ORAL_TABLET | Freq: Every day | ORAL | 0 refills | Status: DC

## 2022-04-09 NOTE — Progress Notes (Signed)
[facility] MD Virtual Progress Note   Patient Location: Home Provider Location: Home Office  I connect with patient by video and verified that I am speaking with correct person by using two identifiers. I discussed the limitations of evaluation and management by telemedicine and the availability of in person appointments. I also discussed with the patient that there may be a patient responsible charge related to this service. The patient expressed understanding and agreed to proceed.  KEYOKA KUNKLE [id number] 67 y.o.  04/09/2022 9:23 AM    History of Present Illness:  Patient is 67 year old married retired female who was seen first time for the management of anxiety and depressive symptoms.  She was referred from primary care physician.  She was taking Cymbalta 90 mg and doxepin at bedtime.  Despite taking the medication she still struggle with fatigue, lack of motivation, crying spells and motivation.  We have started the Wellbutrin and also recommended genetic testing.  Patient told that she decided to hold on the Wellbutrin and like to get the rate testing.  She has to genetic testing and results shows that Pristiq, Viibryd has a better chance to work.  Wellbutrin is in the middle column.  Cymbalta is in the third: And have a chance for drug-drug interaction.  She had cut down the Cymbalta which was recommended and to start the Wellbutrin.  She did not go back on Cymbalta.  She admitted getting easily irritable, emotional, angry and frustrated.  Recently she had a visit to the neurology for migraine.  She denies any hallucination or any paranoia.  She denies any major panic attack but feels very anxious.  She lives with her husband who has chronic health issues.  They have no children.  Sometimes she feels overwhelmed.  Patient also had a COVID and there is a question about "Long COVID".  As she struggled with fogginess and fatigue.  Patient denies drinking or using any illegal  substances.  She has given the diagnosis of apnea and she is working with a neurology.  Patient has a copy of genetic testing results.  She also noticed decrease metallic folate.  She takes methotrexate and taking over-the-counter folic acid.  Past Psychiatric History: History of depression in 1990 and given Prozac and Lexapro by PCP.  Saw Dr Toy Care in 2012 and prescribed Cymbalta.  Dose increased to 120 mg and last year doxepin was added.  No history of suicidal attempt, inpatient treatment, psychosis, mania, abuse, legal issues, seizures, TBI.    Outpatient Encounter Medications as of 04/09/2022  Medication Sig   acetaminophen (TYLENOL) 650 MG CR tablet Take 1,300 mg by mouth every 8 (eight) hours as needed for pain.   aspirin EC 81 MG tablet Take 81 mg by mouth daily. Swallow whole.   baclofen (LIORESAL) 10 MG tablet TAKE 1 TABLET EVERY MORNING AND 1 TABLET AT NOON.   buPROPion (WELLBUTRIN XL) 150 MG 24 hr tablet Take 1 tablet (150 mg total) by mouth every morning.   cetirizine (ZYRTEC) 10 MG tablet Take 10 mg by mouth daily.   cholecalciferol (VITAMIN D) 25 MCG (1000 UNIT) tablet Take 1,000 Units by mouth daily.   diclofenac Sodium (VOLTAREN) 1 % GEL APPLY 2 TO 4 GRAMS TO AFFECTED JOINTS UP TO 4 TIMES DAILY AS NEEDED. (Patient taking differently: Apply 2 g topically 4 (four) times daily as needed (joint pain).)   doxepin (SINEQUAN) 50 MG capsule Take 50 mg by mouth at bedtime.   DULoxetine (CYMBALTA) 30 MG  capsule Take 30 mg by mouth daily.   DULoxetine (CYMBALTA) 60 MG capsule Take 60 mg by mouth daily. (Patient not taking: Reported on 03/09/2022)   Eptinezumab-jjmr (VYEPTI) 100 MG/ML injection Inject into the vein.   ferrous sulfate 325 (65 FE) MG tablet Take 325 mg by mouth daily.   fluticasone (FLONASE ALLERGY RELIEF) 50 MCG/ACT nasal spray Place 1 spray into both nostrils in the morning and at bedtime.   folic acid (FOLVITE) 1 MG tablet Take 2 tablets (2 mg total) by mouth daily.    gabapentin (NEURONTIN) 300 MG capsule Take 300-600 mg by mouth See admin instructions. 600 mg in the morning, 300 mg at NOON, and 600 mg at bedtime   Reports taking only at night   hydroxychloroquine (PLAQUENIL) 200 MG tablet TAKE 1 TABLET TWICE DAILY MONDAY THRU FRIDAY FOR RHEUMATOID ARTHRTITIS.   MAGNESIUM PO Take 500 mg by mouth at bedtime.   meclizine (ANTIVERT) 25 MG tablet Take 1 tablet (25 mg total) by mouth 3 (three) times daily as needed for dizziness.   methotrexate (RHEUMATREX) 2.5 MG tablet TAKE 8 TABLETS WEEKLY -- PROTECT FROM LIGHT   Multiple Vitamin (MULTIVITAMIN WITH MINERALS) TABS tablet Take 1 tablet by mouth daily.   omeprazole (PRILOSEC) 40 MG capsule Take 40 mg by mouth daily.   ondansetron (ZOFRAN-ODT) 8 MG disintegrating tablet Take 8 mg by mouth every 8 (eight) hours as needed for nausea.    Rimegepant Sulfate (NURTEC) 75 MG TBDP Take 1 tablet at the onset of migraine. Only 1 tablet in 24 hours.   Rimegepant Sulfate (NURTEC) 75 MG TBDP Take 1 tablet at the onset of migraine. Only 1 tablet in 24 hours   tiZANidine (ZANAFLEX) 4 MG tablet TAKE 1 TABLET AT BEDTIME AS NEEDED   TURMERIC-GINGER PO Take 1 tablet by mouth daily.   No facility-administered encounter medications on file as of 04/09/2022.    Recent Results (from the past 2160 hour(s))  COMPLETE METABOLIC PANEL WITH GFR     Status: Abnormal   Collection Time: 03/09/22  2:38 PM  Result Value Ref Range   Glucose, Bld 101 (H) 65 - 99 mg/dL    Comment: .            Fasting reference interval . For someone without known diabetes, a glucose value between 100 and 125 mg/dL is consistent with prediabetes and should be confirmed with a follow-up test. .    BUN 18 7 - 25 mg/dL   Creat 0.87 0.50 - 1.05 mg/dL   eGFR 74 > OR = 60 mL/min/1.1m   BUN/Creatinine Ratio SEE NOTE: 6 - 22 (calc)    Comment:    Not Reported: BUN and Creatinine are within    reference range. .    Sodium 140 135 - 146 mmol/L   Potassium  4.5 3.5 - 5.3 mmol/L   Chloride 104 98 - 110 mmol/L   CO2 27 20 - 32 mmol/L   Calcium 10.5 (H) 8.6 - 10.4 mg/dL   Total Protein 7.2 6.1 - 8.1 g/dL   Albumin 4.8 3.6 - 5.1 g/dL   Globulin 2.4 1.9 - 3.7 g/dL (calc)   AG Ratio 2.0 1.0 - 2.5 (calc)   Total Bilirubin 0.2 0.2 - 1.2 mg/dL   Alkaline phosphatase (APISO) 61 37 - 153 U/L   AST 21 10 - 35 U/L   ALT 23 6 - 29 U/L  CBC with Differential/Platelet     Status: Abnormal   Collection Time: 03/09/22  2:38 PM  Result Value Ref Range   WBC 11.4 (H) 3.8 - 10.8 Thousand/uL   RBC 4.42 3.80 - 5.10 Million/uL   Hemoglobin 14.1 11.7 - 15.5 g/dL   HCT 41.7 35.0 - 45.0 %   MCV 94.3 80.0 - 100.0 fL   MCH 31.9 27.0 - 33.0 pg   MCHC 33.8 32.0 - 36.0 g/dL   RDW 13.3 11.0 - 15.0 %   Platelets 429 (H) 140 - 400 Thousand/uL   MPV 10.0 7.5 - 12.5 fL   Neutro Abs 6,316 1,500 - 7,800 cells/uL   Lymphs Abs 3,751 850 - 3,900 cells/uL   Absolute Monocytes 787 200 - 950 cells/uL   Eosinophils Absolute 410 15 - 500 cells/uL   Basophils Absolute 137 0 - 200 cells/uL   Neutrophils Relative % 55.4 %   Total Lymphocyte 32.9 %   Monocytes Relative 6.9 %   Eosinophils Relative 3.6 %   Basophils Relative 1.2 %     Psychiatric Specialty Exam: Physical Exam  Review of Systems  Musculoskeletal:  Positive for back pain.    Weight 169 lb (76.7 kg).There is no height or weight on file to calculate BMI.  General Appearance: Casual  Eye Contact:  Good  Speech:  Normal Rate  Volume:  Normal  Mood:  Anxious, Depressed, Dysphoric, and Irritable  Affect:  Congruent  Thought Process:  Goal Directed  Orientation:  Full (Time, Place, and Person)  Thought Content:  Rumination  Suicidal Thoughts:  No  Homicidal Thoughts:  No  Memory:  Immediate;   Good Recent;   Good Remote;   Good  Judgement:  Intact  Insight:  Present  Psychomotor Activity:  Decreased  Concentration:  Concentration: Fair and Attention Span: Fair  Recall:  Good  Fund of Knowledge:  Good   Language:  Good  Akathisia:  No  Handed:  Right  AIMS (if indicated):     Assets:  Communication Skills Desire for Improvement Housing Social Support Transportation  ADL's:  Intact  Cognition:  WNL  Sleep: Okay     Assessment/Plan: Anxiety - Plan: Vilazodone HCl 10 & 20 MG KIT  MDD (major depressive disorder), recurrent episode, moderate (HCC) - Plan: Vilazodone HCl 10 & 20 MG KIT  Review notes from other providers and genetic testing with the patient patient has a copy of the results.  Most favorable medications are Pristiq, Viibryd.  Patient has not started the Wellbutrin but cut down the Cymbalta 30 mg.  After discussion patient agreed to give a trial of Viibryd.  We will start 10 mg for 1 week and then 20 mg daily.  I recommend when she start taking 20 mg Viibryd then she can stop the Cymbalta.  She will continue doxepin which is prescribed by previous provider and still has refills remaining.  If Viibryd did not work or she could not afford then we may need to consider starting either Pristiq for Wellbutrin.  I also recommend consider getting B12 level check as patient taking methotrexate and folic acid.  Recommended to call us back if she is any question or any concern.  Follow-up in 4 weeks.   Follow Up Instructions:     I discussed the assessment and treatment plan with the patient. The patient was provided an opportunity to ask questions and all were answered. The patient agreed with the plan and demonstrated an understanding of the instructions.   The patient was advised to call back or seek an in-person evaluation if the  symptoms worsen or if the condition fails to improve as anticipated.    Collaboration of Care: Other provider involved in patient's care AEB notes are available in epic to review.  Patient/Guardian was advised Release of Information must be obtained prior to any record release in order to collaborate their care with an outside provider. Patient/Guardian  was advised if they have not already done so to contact the registration department to sign all necessary forms in order for [place] to release information regarding their care.   Consent: Patient/Guardian gives verbal consent for treatment and assignment of benefits for services provided during this visit. Patient/Guardian expressed understanding and agreed to proceed.     I provided 25 minutes of non face to face time during this encounter.  Kathlee Nations, MD 04/09/2022

## 2022-04-13 ENCOUNTER — Ambulatory Visit: Payer: Medicare PPO | Admitting: Physician Assistant

## 2022-04-13 ENCOUNTER — Ambulatory Visit: Payer: Medicare PPO | Admitting: Neurology

## 2022-04-13 DIAGNOSIS — M19041 Primary osteoarthritis, right hand: Secondary | ICD-10-CM

## 2022-04-13 DIAGNOSIS — M5030 Other cervical disc degeneration, unspecified cervical region: Secondary | ICD-10-CM

## 2022-04-13 DIAGNOSIS — Z8639 Personal history of other endocrine, nutritional and metabolic disease: Secondary | ICD-10-CM

## 2022-04-13 DIAGNOSIS — M7061 Trochanteric bursitis, right hip: Secondary | ICD-10-CM

## 2022-04-13 DIAGNOSIS — Z8659 Personal history of other mental and behavioral disorders: Secondary | ICD-10-CM

## 2022-04-13 DIAGNOSIS — Z8261 Family history of arthritis: Secondary | ICD-10-CM

## 2022-04-13 DIAGNOSIS — R5383 Other fatigue: Secondary | ICD-10-CM

## 2022-04-13 DIAGNOSIS — M1711 Unilateral primary osteoarthritis, right knee: Secondary | ICD-10-CM

## 2022-04-13 DIAGNOSIS — Z87442 Personal history of urinary calculi: Secondary | ICD-10-CM

## 2022-04-13 DIAGNOSIS — Z840 Family history of diseases of the skin and subcutaneous tissue: Secondary | ICD-10-CM

## 2022-04-13 DIAGNOSIS — Z8742 Personal history of other diseases of the female genital tract: Secondary | ICD-10-CM

## 2022-04-13 DIAGNOSIS — M0609 Rheumatoid arthritis without rheumatoid factor, multiple sites: Secondary | ICD-10-CM

## 2022-04-13 DIAGNOSIS — Z79899 Other long term (current) drug therapy: Secondary | ICD-10-CM

## 2022-04-13 DIAGNOSIS — M5136 Other intervertebral disc degeneration, lumbar region: Secondary | ICD-10-CM

## 2022-04-13 DIAGNOSIS — M19071 Primary osteoarthritis, right ankle and foot: Secondary | ICD-10-CM

## 2022-04-13 DIAGNOSIS — Z8719 Personal history of other diseases of the digestive system: Secondary | ICD-10-CM

## 2022-04-13 DIAGNOSIS — G8929 Other chronic pain: Secondary | ICD-10-CM

## 2022-04-13 DIAGNOSIS — M797 Fibromyalgia: Secondary | ICD-10-CM

## 2022-04-13 DIAGNOSIS — Z8669 Personal history of other diseases of the nervous system and sense organs: Secondary | ICD-10-CM

## 2022-04-13 DIAGNOSIS — Z9289 Personal history of other medical treatment: Secondary | ICD-10-CM

## 2022-04-13 DIAGNOSIS — Z872 Personal history of diseases of the skin and subcutaneous tissue: Secondary | ICD-10-CM

## 2022-04-13 NOTE — Progress Notes (Deleted)
Office Visit Note  Patient: Christina Lewis             Date of Birth: [birth year]           MRN: [id number]             PCP: Baruch Gouty, FNP Referring: Monico Blitz, MD Visit Date: 04/20/2022 Occupation: '@GUAROCC'$ @  Subjective:    History of Present Illness: Christina Lewis is a 67 y.o. female with history of seronegative rheumatoid arthritis, osteoarthritis, and DDD.  Patient is taking plaquenil 200 mg 1 tablet by mouth twice daily Monday through Friday.   Started on MTX in August 2023   CBC and CMP updated on 03/09/22.   PLQ Eye Exam: 10/07/2021 WNL @ [facility]   Activities of Daily Living:  Patient reports morning stiffness for *** {minute/hour:19697}.   Patient {ACTIONS;DENIES/REPORTS:21021675::"Denies"} nocturnal pain.  Difficulty dressing/grooming: {ACTIONS;DENIES/REPORTS:21021675::"Denies"} Difficulty climbing stairs: {ACTIONS;DENIES/REPORTS:21021675::"Denies"} Difficulty getting out of chair: {ACTIONS;DENIES/REPORTS:21021675::"Denies"} Difficulty using hands for taps, buttons, cutlery, and/or writing: {ACTIONS;DENIES/REPORTS:21021675::"Denies"}  No Rheumatology ROS completed.   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   Vitamin D deficiency 10/15/2021   Polycystic ovaries 10/15/2021   Migraine without aura and without status migrainosus, not intractable 10/15/2021   BMI 29.0-29.9,adult 10/15/2021   Cellulitis 03/04/2021   Rheumatoid arthritis ([place]) 03/04/2021   Diabetes mellitus type 2 in obese ([place]) 03/04/2021   Right foot infection 03/04/2021   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 myelopathy 09/11/2019   Leukocytosis 11/04/2016   Fibromyalgia 02/10/2016   Other fatigue 02/10/2016   Primary osteoarthritis of both hands 02/10/2016    History of migraine 02/10/2016   History of thyroid nodule 02/10/2016   History of sleep apnea 02/10/2016   History of renal calculi 02/10/2016   Pain in joint, shoulder region 10/02/2010   Adhesive capsulitis of shoulder 10/02/2010   Muscle weakness (generalized) 10/02/2010    Past Medical History:  Diagnosis Date   Allergy    Anxiety    Arthritis    Depression    Diabetes mellitus    Fibromyalgia    GERD (gastroesophageal reflux disease)    Gout    Hematuria    History of kidney stones    History of tics    Ineffective esophageal motility    Interstitial cystitis 2016   Migraines    Osteoarthritis    PCOS (polycystic ovarian syndrome) 1993   Pneumonia 2019   PONV (postoperative nausea and vomiting)    Sleep apnea    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, took medication for a year   Thyroid nodule     Family History  Problem Relation Age of Onset   Fibromyalgia Mother    Psoriasis Mother        psoriatic arthritis    Diabetes Mother    Heart disease Mother    Psoriasis Sister        psoriatic arthritis    Diabetes Sister    Migraines Sister    Rheum arthritis Sister    Fibromyalgia Sister    Diabetes Sister    Past Surgical History:  Procedure Laterality Date   ABDOMINAL HYSTERECTOMY  2000   APPENDECTOMY  1976   BALLOON DILATION N/A 11/03/2021   Procedure: BALLOON DILATION;  Surgeon:  Ronnette Juniper, MD;  Location: [facility];  Service: Gastroenterology;  Laterality: N/A;   BOTOX INJECTION N/A 11/03/2021   Procedure: BOTOX INJECTION;  Surgeon: Ronnette Juniper, MD;  Location: [facility];  Service: Gastroenterology;  Laterality: N/A;   BREAST LUMPECTOMY  1989   lt-negative   CARDIOVASCULAR STRESS TEST  2000   CATARACT EXTRACTION W/PHACO Left 12/07/2019   Procedure: CATARACT EXTRACTION PHACO AND INTRAOCULAR LENS PLACEMENT (IOC);  Surgeon: Baruch Goldmann, MD;  Location: [facility];  Service: Ophthalmology;  Laterality: Left;  CDE: 5.55    CHOLECYSTECTOMY  1985   COLONOSCOPY     ESOPHAGEAL MANOMETRY N/A 08/24/2021   Procedure: ESOPHAGEAL MANOMETRY (EM);  Surgeon: Ronnette Juniper, MD;  Location: [facility];  Service: Gastroenterology;  Laterality: N/A;   ESOPHAGOGASTRODUODENOSCOPY N/A 11/03/2021   Procedure: ESOPHAGOGASTRODUODENOSCOPY (EGD);  Surgeon: Ronnette Juniper, MD;  Location: [facility];  Service: Gastroenterology;  Laterality: N/A;   HYSTERECTOMY ABDOMINAL WITH SALPINGECTOMY Bilateral 2000   KIDNEY STONE SURGERY Right 2014   with stent placement in OR   KNEE ARTHROSCOPY Right 08/22/2013   Procedure: ARTHROSCOPY RIGHT KNEE FOR INFECTION LAVAGE AND DRAINAGE;  Surgeon: Yvette Rack., MD;  Location: [place];  Service: Orthopedics;  Laterality: Right;   KNEE ARTHROSCOPY WITH PATELLA RECONSTRUCTION Right 08/06/2013   Procedure: RIGHT KNEE ARTHROSCOPY WITH MENISCECTOMY MEDIAL, ARTHROSCOPY KNEE WITH DEBRIDEMENT/SHAVING (CHONDROPLASTY) ;  Surgeon: Yvette Rack., MD;  Location: [place];  Service: Orthopedics;  Laterality: Right;   LUMBAR LAMINECTOMY/DECOMPRESSION MICRODISCECTOMY Right 09/11/2019   Procedure: Right Lumbar Three-Four Microdiscectomy;  Surgeon: Erline Levine, MD;  Location: [place];  Service: Neurosurgery;  Laterality: Right;  Right Lumbar Three-Four Microdiscectomy   NASAL SINUS SURGERY     x4   OOPHORECTOMY     SHOULDER SURGERY  2011   right shoulder   THYROID SURGERY  1994   TONSILLECTOMY Bilateral 1974   Social History   Social History Narrative   Caffiene 4-5 12 oz cans soda.   Working retired - special ed.   Live with home no kids   Immunization History  Administered Date(s) Administered   Tdap 03/04/2021   Zoster Recombinat (Shingrix) 10/15/2021     Objective: Vital Signs: There were no vitals taken for this visit.   Physical Exam Vitals and nursing note reviewed.  Constitutional:      Appearance: She is well-developed.  HENT:     Head: Normocephalic and  atraumatic.  Eyes:     Conjunctiva/sclera: Conjunctivae normal.  Cardiovascular:     Rate and Rhythm: Normal rate and regular rhythm.     Heart sounds: Normal heart sounds.  Pulmonary:     Effort: Pulmonary effort is normal.     Breath sounds: Normal breath sounds.  Abdominal:     General: Bowel sounds are normal.     Palpations: Abdomen is soft.  Musculoskeletal:     Cervical back: Normal range of motion.  Skin:    General: Skin is warm and dry.     Capillary Refill: Capillary refill takes less than 2 seconds.  Neurological:     Mental Status: She is alert and oriented to person, place, and time.  Psychiatric:        Behavior: Behavior normal.      Musculoskeletal Exam: ***  CDAI Exam: CDAI Score: -- Patient Global: --; Provider Global: -- Swollen: --; Tender: -- Joint Exam 04/20/2022   No joint exam has been documented for this visit   There is currently  no information documented on the homunculus. Go to the Rheumatology activity and complete the homunculus joint exam.  Investigation: No additional findings.  Imaging: No results found.  Recent Labs: Lab Results  Component Value Date   WBC 11.4 (H) 03/09/2022   HGB 14.1 03/09/2022   PLT 429 (H) 03/09/2022   NA 140 03/09/2022   K 4.5 03/09/2022   CL 104 03/09/2022   CO2 27 03/09/2022   GLUCOSE 101 (H) 03/09/2022   BUN 18 03/09/2022   CREATININE 0.87 03/09/2022   BILITOT 0.2 03/09/2022   ALKPHOS 82 10/15/2021   AST 21 03/09/2022   ALT 23 03/09/2022   PROT 7.2 03/09/2022   ALBUMIN 4.5 10/15/2021   CALCIUM 10.5 (H) 03/09/2022   GFRAA 98 03/24/2020   QFTBGOLDPLUS NEGATIVE 09/29/2021    Speciality Comments: PLQ Eye Exam: 10/07/2021 WNL @ [facility]  f/u 01/2022  Procedures:  No procedures performed Allergies: Sulfa antibiotics, Codeine, Erythromycin, Metronidazole, and Morphine and related   Assessment / Plan:     Visit Diagnoses: Rheumatoid arthritis of multiple sites with negative  rheumatoid factor (HCC)  High risk medication use  Primary osteoarthritis of both hands  Primary osteoarthritis of right knee  Trochanteric bursitis of right hip  Primary osteoarthritis of both feet  Other fatigue  DDD (degenerative disc disease), cervical  DDD (degenerative disc disease), lumbar  Chronic SI joint pain  Fibromyalgia  Family history of rheumatoid arthritis-Sister  Family history of psoriatic arthritis-mother and sister  History of tics  History of diabetes mellitus  History of sleep apnea  History of thyroid nodule  History of gastroesophageal reflux (GERD)  History of renal calculi  History of depression  History of cellulitis  History of positive PPD  History of migraine  History of PCOS  Orders: No orders of the defined types were placed in this encounter.  No orders of the defined types were placed in this encounter.   Face-to-face time spent with patient was *** minutes. Greater than 50% of time was spent in counseling and coordination of care.  Follow-Up Instructions: No follow-ups on file.   Ofilia Neas, PA-C  Note - This record has been created using Dragon software.  Chart creation errors have been sought, but may not always  have been located. Such creation errors do not reflect on  the standard of medical care.

## 2022-04-14 ENCOUNTER — Encounter (HOSPITAL_COMMUNITY): Payer: Self-pay

## 2022-04-14 ENCOUNTER — Ambulatory Visit (HOSPITAL_COMMUNITY): Payer: Medicare PPO | Admitting: Clinical

## 2022-04-19 ENCOUNTER — Other Ambulatory Visit: Payer: Medicare PPO

## 2022-04-20 ENCOUNTER — Ambulatory Visit: Payer: Medicare PPO | Admitting: Physician Assistant

## 2022-04-20 DIAGNOSIS — M19041 Primary osteoarthritis, right hand: Secondary | ICD-10-CM

## 2022-04-20 DIAGNOSIS — Z8719 Personal history of other diseases of the digestive system: Secondary | ICD-10-CM

## 2022-04-20 DIAGNOSIS — R5383 Other fatigue: Secondary | ICD-10-CM

## 2022-04-20 DIAGNOSIS — Z872 Personal history of diseases of the skin and subcutaneous tissue: Secondary | ICD-10-CM

## 2022-04-20 DIAGNOSIS — Z8261 Family history of arthritis: Secondary | ICD-10-CM

## 2022-04-20 DIAGNOSIS — Z8669 Personal history of other diseases of the nervous system and sense organs: Secondary | ICD-10-CM

## 2022-04-20 DIAGNOSIS — Z9289 Personal history of other medical treatment: Secondary | ICD-10-CM

## 2022-04-20 DIAGNOSIS — Z8639 Personal history of other endocrine, nutritional and metabolic disease: Secondary | ICD-10-CM

## 2022-04-20 DIAGNOSIS — M19071 Primary osteoarthritis, right ankle and foot: Secondary | ICD-10-CM

## 2022-04-20 DIAGNOSIS — Z87442 Personal history of urinary calculi: Secondary | ICD-10-CM

## 2022-04-20 DIAGNOSIS — M7061 Trochanteric bursitis, right hip: Secondary | ICD-10-CM

## 2022-04-20 DIAGNOSIS — M5030 Other cervical disc degeneration, unspecified cervical region: Secondary | ICD-10-CM

## 2022-04-20 DIAGNOSIS — G8929 Other chronic pain: Secondary | ICD-10-CM

## 2022-04-20 DIAGNOSIS — M0609 Rheumatoid arthritis without rheumatoid factor, multiple sites: Secondary | ICD-10-CM

## 2022-04-20 DIAGNOSIS — Z79899 Other long term (current) drug therapy: Secondary | ICD-10-CM

## 2022-04-20 DIAGNOSIS — Z840 Family history of diseases of the skin and subcutaneous tissue: Secondary | ICD-10-CM

## 2022-04-20 DIAGNOSIS — Z8742 Personal history of other diseases of the female genital tract: Secondary | ICD-10-CM

## 2022-04-20 DIAGNOSIS — Z8659 Personal history of other mental and behavioral disorders: Secondary | ICD-10-CM

## 2022-04-20 DIAGNOSIS — M5136 Other intervertebral disc degeneration, lumbar region: Secondary | ICD-10-CM

## 2022-04-20 DIAGNOSIS — M797 Fibromyalgia: Secondary | ICD-10-CM

## 2022-04-20 DIAGNOSIS — M1711 Unilateral primary osteoarthritis, right knee: Secondary | ICD-10-CM

## 2022-04-26 ENCOUNTER — Telehealth (INDEPENDENT_AMBULATORY_CARE_PROVIDER_SITE_OTHER): Payer: Medicare PPO | Admitting: Neurology

## 2022-04-26 ENCOUNTER — Encounter: Payer: Self-pay | Admitting: Neurology

## 2022-04-26 ENCOUNTER — Telehealth: Payer: Self-pay | Admitting: Neurology

## 2022-04-26 DIAGNOSIS — G43709 Chronic migraine without aura, not intractable, without status migrainosus: Secondary | ICD-10-CM | POA: Diagnosis not present

## 2022-04-26 DIAGNOSIS — G43009 Migraine without aura, not intractable, without status migrainosus: Secondary | ICD-10-CM

## 2022-04-26 MED ORDER — NURTEC 75 MG PO TBDP: 75.0000 mg | ORAL_TABLET | Freq: Every day | ORAL | 6 refills | Status: DC | PRN

## 2022-04-26 NOTE — Telephone Encounter (Signed)
Schedule follow up with [name] in 7 months 9this will give her change to have 2 vyepti infusions and then see megan) she can do it via video thanks

## 2022-04-26 NOTE — Telephone Encounter (Signed)
Sent mychart msg informing pt of follow up made with [facility]
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
## 2022-04-26 NOTE — Patient Instructions (Signed)
Increase to '300mg'$  vyepti Will check on nurtec PA Will call for follow up  Eptinezumab Injection What is this medication? EPTINEZUMAB (EP ti NEZ ue mab) prevents migraines. It works by blocking a substance in the body that causes migraines. It is a monoclonal antibody. This medicine may be used for other purposes; ask your health care provider or pharmacist if you have questions. COMMON BRAND NAME(S): Vyepti What should I tell my care team before I take this medication? They need to know if you have any of these conditions: An unusual or allergic reaction to eptinezumab, other medications, foods, dyes, or preservatives Pregnant or trying to get pregnant Breast-feeding How should I use this medication? This medication is injected into a vein. It is given by your care team in a hospital or clinic setting. Talk to your care team about the use of this medication in children. Special care may be needed. Overdosage: If you think you have taken too much of this medicine contact a poison control center or emergency room at once. NOTE: This medicine is only for you. Do not share this medicine with others. What if I miss a dose? Keep appointments for follow-up doses. It is important not to miss your dose. Call your care team if you are unable to keep an appointment. What may interact with this medication? Interactions are not expected. This list may not describe all possible interactions. Give your health care provider a list of all the medicines, herbs, non-prescription drugs, or dietary supplements you use. Also tell them if you smoke, drink alcohol, or use illegal drugs. Some items may interact with your medicine. What should I watch for while using this medication? Your condition will be monitored carefully while you are receiving this medication. What side effects may I notice from receiving this medication? Side effects that you should report to your care team as soon as possible: Allergic  reactions or angioedema--skin rash, itching or hives, swelling of the face, eyes, lips, tongue, arms, or legs, trouble swallowing or breathing Side effects that usually do not require medical attention (report to your care team if they continue or are bothersome): Runny or stuffy nose This list may not describe all possible side effects. Call your doctor for medical advice about side effects. You may report side effects to FDA at 1-800-FDA-1088. Where should I keep my medication? This medication is given in a hospital or clinic. It will not be stored at home. NOTE: This sheet is a summary. It may not cover all possible information. If you have questions about this medicine, talk to your doctor, pharmacist, or health care provider.  2023 Elsevier/Gold Standard (2021-04-06 00:00:00) Rimegepant Disintegrating Tablets What is this medication? RIMEGEPANT (ri ME je pant) prevents and treats migraines. It works by blocking a substance in the body that causes migraines. This medicine may be used for other purposes; ask your health care provider or pharmacist if you have questions. COMMON BRAND NAME(S): NURTEC ODT What should I tell my care team before I take this medication? They need to know if you have any of these conditions: Kidney disease Liver disease An unusual or allergic reaction to rimegepant, other medications, foods, dyes, or preservatives Pregnant or trying to get pregnant Breast-feeding How should I use this medication? Take this medication by mouth. Take it as directed on the prescription label. Leave the tablet in the sealed pack until you are ready to take it. With dry hands, open the pack and gently remove the tablet. If the  tablet breaks or crumbles, throw it away. Use a new tablet. Place the tablet in the mouth and allow it to dissolve. Then, swallow it. Do not cut, crush, or chew this medication. You do not need water to take this medication. Talk to your care team about the use  of this medication in children. Special care may be needed. Overdosage: If you think you have taken too much of this medicine contact a poison control center or emergency room at once. NOTE: This medicine is only for you. Do not share this medicine with others. What if I miss a dose? This does not apply. This medication is not for regular use. What may interact with this medication? Certain medications for fungal infections, such as fluconazole, itraconazole Rifampin This list may not describe all possible interactions. Give your health care provider a list of all the medicines, herbs, non-prescription drugs, or dietary supplements you use. Also tell them if you smoke, drink alcohol, or use illegal drugs. Some items may interact with your medicine. What should I watch for while using this medication? Visit your care team for regular checks on your progress. Tell your care team if your symptoms do not start to get better or if they get worse. What side effects may I notice from receiving this medication? Side effects that you should report to your care team as soon as possible: Allergic reactions--skin rash, itching, hives, swelling of the face, lips, tongue, or throat Side effects that usually do not require medical attention (report to your care team if they continue or are bothersome): Nausea Stomach pain This list may not describe all possible side effects. Call your doctor for medical advice about side effects. You may report side effects to FDA at 1-800-FDA-1088. Where should I keep my medication? Keep out of the reach of children and pets. Store at room temperature between 20 and 25 degrees C (68 and 77 degrees F). Get rid of any unused medication after the expiration date. To get rid of medications that are no longer needed or have expired: Take the medication to a medication take-back program. Check with your pharmacy or law enforcement to find a location. If you cannot return the  medication, check the label or package insert to see if the medication should be thrown out in the garbage or flushed down the toilet. If you are not sure, ask your care team. If it is safe to put it in the trash, take the medication out of the container. Mix the medication with cat litter, dirt, coffee grounds, or other unwanted substance. Seal the mixture in a bag or container. Put it in the trash. NOTE: This sheet is a summary. It may not cover all possible information. If you have questions about this medicine, talk to your doctor, pharmacist, or health care provider.  2023 Elsevier/Gold Standard (2021-04-01 00:00:00)

## 2022-04-26 NOTE — Telephone Encounter (Signed)
Patient called. Nurtec prescribed 2 weeks ao she is still waiting on PA. I told her it may take longer. Fyi take a look if needed

## 2022-04-27 ENCOUNTER — Other Ambulatory Visit (HOSPITAL_COMMUNITY): Payer: Self-pay

## 2022-04-27 ENCOUNTER — Telehealth (HOSPITAL_COMMUNITY): Payer: Self-pay | Admitting: *Deleted

## 2022-04-27 ENCOUNTER — Other Ambulatory Visit (HOSPITAL_COMMUNITY): Payer: Self-pay | Admitting: *Deleted

## 2022-04-27 DIAGNOSIS — G43711 Chronic migraine without aura, intractable, with status migrainosus: Secondary | ICD-10-CM | POA: Diagnosis not present

## 2022-04-27 MED ORDER — BUPROPION HCL ER (XL) 150 MG PO TB24: 150.0000 mg | ORAL_TABLET | ORAL | 0 refills | Status: DC

## 2022-04-27 NOTE — Telephone Encounter (Signed)
PA FOR VILAZODONE HCL 20 MG TABLETS HAS BEEN DENIED BY [employer]ICARE.  REASON: NO KNOWN H/O TRYING BUPROPION OR MIRTAZAPINE.

## 2022-04-27 NOTE — Telephone Encounter (Signed)
Pt advised. Will do.

## 2022-04-27 NOTE — Telephone Encounter (Signed)
Please inform the patient that insurance denied Viibryd.  If she agrees she can try Wellbutrin.  Her PCP wrote the prescription of Wellbutrin XL 150 mg unless prescription expired and we can call the prescription to her pharmacy.

## 2022-04-27 NOTE — Telephone Encounter (Signed)
PA FOR VILAZODONE 20 MG SUBMITTED TO [employer] MEDICARE VIA [employer].  AWAITING DETERMINATION.

## 2022-04-27 NOTE — Telephone Encounter (Signed)
Received key code from [place] for Nurtec PA. This was completed on [employer] portal. Key code: [id number]

## 2022-04-27 NOTE — Telephone Encounter (Addendum)
Received a request for a PA for Nurtec-    This has been approved-Per test billing at [facility] the copay was $100.00 CMM [id number]  The PA from [facility] must have been approved-We do not have access to or utilize [employer].

## 2022-04-28 ENCOUNTER — Other Ambulatory Visit: Payer: Medicare PPO

## 2022-05-03 ENCOUNTER — Other Ambulatory Visit: Payer: Self-pay | Admitting: Physician Assistant

## 2022-05-03 ENCOUNTER — Ambulatory Visit (INDEPENDENT_AMBULATORY_CARE_PROVIDER_SITE_OTHER): Payer: Medicare PPO | Admitting: Neurology

## 2022-05-03 DIAGNOSIS — G4733 Obstructive sleep apnea (adult) (pediatric): Secondary | ICD-10-CM

## 2022-05-03 DIAGNOSIS — R351 Nocturia: Secondary | ICD-10-CM

## 2022-05-03 DIAGNOSIS — G4720 Circadian rhythm sleep disorder, unspecified type: Secondary | ICD-10-CM

## 2022-05-03 DIAGNOSIS — E663 Overweight: Secondary | ICD-10-CM

## 2022-05-03 DIAGNOSIS — R519 Headache, unspecified: Secondary | ICD-10-CM

## 2022-05-03 NOTE — Telephone Encounter (Signed)
Called [employer] patient assistance program at [phone] and LVM asking for someone to fax [place] a patient savings application for Nurtec. I provided our phone & fax number. Will try to reach them again if I haven't heard by tomorrow afternoon.

## 2022-05-03 NOTE — Telephone Encounter (Signed)
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 weekly. Patient increased dose in September 2023.   Labs: 03/09/2022 CBC and CMP are stable.  Calcium is mildly elevated.   Okay to refill MTX?

## 2022-05-04 NOTE — Telephone Encounter (Signed)
I called the Nurtec [employer] Program and spoke with a representative who took my name & number and will have someone from the patient assistance program call me back tomorrow.

## 2022-05-06 ENCOUNTER — Ambulatory Visit
Admission: RE | Admit: 2022-05-06 | Discharge: 2022-05-06 | Disposition: A | Discharge status: Home / Self Care | Payer: Medicare PPO | Source: Ambulatory Visit | Attending: Endocrinology | Admitting: Endocrinology

## 2022-05-06 ENCOUNTER — Ambulatory Visit: Payer: Medicare PPO | Admitting: Physician Assistant

## 2022-05-06 ENCOUNTER — Telehealth: Payer: Self-pay | Admitting: Neurology

## 2022-05-06 DIAGNOSIS — E042 Nontoxic multinodular goiter: Secondary | ICD-10-CM | POA: Diagnosis not present

## 2022-05-06 DIAGNOSIS — E049 Nontoxic goiter, unspecified: Secondary | ICD-10-CM

## 2022-05-06 NOTE — Telephone Encounter (Signed)
Spoke to [employer] and she provided me the website with the application for [employer] patient assistance program. If patient is eligible and approved, the medication is free and will be shipped to her.   https://assets.needymeds.org/papforms/[email or web address]

## 2022-05-06 NOTE — Telephone Encounter (Signed)
Rosalind from [employer] thru [place] is asking for a call from [place], [place] re: pt's Nurtec she can be reached at [phone]

## 2022-05-08 ENCOUNTER — Other Ambulatory Visit: Payer: Self-pay | Admitting: Rheumatology

## 2022-05-08 ENCOUNTER — Other Ambulatory Visit: Payer: Self-pay | Admitting: Physician Assistant

## 2022-05-08 DIAGNOSIS — M0609 Rheumatoid arthritis without rheumatoid factor, multiple sites: Secondary | ICD-10-CM

## 2022-05-10 ENCOUNTER — Encounter: Payer: Self-pay | Admitting: *Deleted

## 2022-05-10 ENCOUNTER — Encounter (HOSPITAL_COMMUNITY): Payer: Self-pay | Admitting: Psychiatry

## 2022-05-10 ENCOUNTER — Telehealth (HOSPITAL_BASED_OUTPATIENT_CLINIC_OR_DEPARTMENT_OTHER): Payer: Medicare PPO | Admitting: Psychiatry

## 2022-05-10 ENCOUNTER — Encounter: Payer: Self-pay | Admitting: Neurology

## 2022-05-10 VITALS — Wt 169.0 lb

## 2022-05-10 DIAGNOSIS — F419 Anxiety disorder, unspecified: Secondary | ICD-10-CM | POA: Diagnosis not present

## 2022-05-10 DIAGNOSIS — F331 Major depressive disorder, recurrent, moderate: Secondary | ICD-10-CM | POA: Diagnosis not present

## 2022-05-10 MED ORDER — DOXEPIN HCL 50 MG PO CAPS: 50.0000 mg | ORAL_CAPSULE | Freq: Every day | ORAL | 1 refills | Status: DC

## 2022-05-10 MED ORDER — BUPROPION HCL ER (XL) 300 MG PO TB24: 300.0000 mg | ORAL_TABLET | ORAL | 1 refills | Status: DC

## 2022-05-10 NOTE — Telephone Encounter (Signed)
Attempted to contact the patient and left message for patient to call the office.  

## 2022-05-10 NOTE — Progress Notes (Signed)
[facility] MD Virtual Progress Note   Patient Location: In Car Provider Location: Home Office  I connect with patient by video and verified that I am speaking with correct person by using two identifiers. I discussed the limitations of evaluation and management by telemedicine and the availability of in person appointments. I also discussed with the patient that there may be a patient responsible charge related to this service. The patient expressed understanding and agreed to proceed.  Christina Lewis [id number] 67 y.o.  05/10/2022 3:25 PM  History of Present Illness:  Patient is evaluated by video session.  On the last visit we tried [place] but her insurance did not approved.  She is not taking Wellbutrin XL 150 mg daily.  She noticed mild improvement has more energy and motivation but is still has chronic depression, anxiety.  She is taking Cymbalta 30 mg daily.  She denies any crying spells or any feeling of hopelessness or worthlessness.  She recently saw the neurology and scheduled to have a new sleep study.  She had Long COVID as sometimes she feels fatigued, having fogginess.  She feels the Wellbutrin had helped some of her energy and fatigue.  She has no tremors, shakes or any EPS.  She denies any suicidal thoughts or homicidal thoughts.  Past Psychiatric History: History of depression in 1990 and given Prozac and Lexapro by PCP.  Saw Dr Toy Care in 2012 and prescribed Cymbalta.  Dose increased to 120 mg and last year doxepin was added.  No history of suicidal attempt, inpatient treatment, psychosis, mania, abuse, legal issues, seizures, TBI.    Outpatient Encounter Medications as of 05/10/2022  Medication Sig   acetaminophen (TYLENOL) 650 MG CR tablet Take 1,300 mg by mouth every 8 (eight) hours as needed for pain.   aspirin EC 81 MG tablet Take 81 mg by mouth daily. Swallow whole.   baclofen (LIORESAL) 10 MG tablet TAKE 1 TABLET EVERY MORNING AND 1 TABLET AT NOON.    buPROPion (WELLBUTRIN XL) 150 MG 24 hr tablet Take 1 tablet (150 mg total) by mouth every morning.   cholecalciferol (VITAMIN D) 25 MCG (1000 UNIT) tablet Take 1,000 Units by mouth daily.   doxepin (SINEQUAN) 50 MG capsule Take 50 mg by mouth at bedtime.   DULoxetine (CYMBALTA) 30 MG capsule Take 30 mg by mouth daily.   Eptinezumab-jjmr (VYEPTI) 100 MG/ML injection Inject into the vein.   ferrous sulfate 325 (65 FE) MG tablet Take 325 mg by mouth daily.   fluticasone (FLONASE ALLERGY RELIEF) 50 MCG/ACT nasal spray Place 1 spray into both nostrils in the morning and at bedtime.   folic acid (FOLVITE) 1 MG tablet TAKE 2 TABLETS BY MOUTH DAILY.   gabapentin (NEURONTIN) 300 MG capsule Take 300-600 mg by mouth See admin instructions. 600 mg in the morning, 300 mg at NOON, and 600 mg at bedtime   Reports taking only at night   hydroxychloroquine (PLAQUENIL) 200 MG tablet TAKE 1 TABLET TWICE DAILY MONDAY THRU FRIDAY FOR RHEUMATOID ARTHRTITIS.   MAGNESIUM PO Take 500 mg by mouth at bedtime.   meclizine (ANTIVERT) 25 MG tablet Take 1 tablet (25 mg total) by mouth 3 (three) times daily as needed for dizziness.   methotrexate (RHEUMATREX) 2.5 MG tablet TAKE 8 TABLETS WEEKLY -- PROTECT FROM LIGHT   Multiple Vitamin (MULTIVITAMIN WITH MINERALS) TABS tablet Take 1 tablet by mouth daily.   omeprazole (PRILOSEC) 40 MG capsule Take 40 mg by mouth daily.   ondansetron (ZOFRAN-ODT)  8 MG disintegrating tablet Take 8 mg by mouth every 8 (eight) hours as needed for nausea.    Rimegepant Sulfate (NURTEC) 75 MG TBDP Take 1 tablet (75 mg total) by mouth daily as needed. For migraines. Take as close to onset of migraine as possible. One daily maximum.   tiZANidine (ZANAFLEX) 4 MG tablet TAKE 1 TABLET AT BEDTIME AS NEEDED   TURMERIC-GINGER PO Take 1 tablet by mouth daily.   No facility-administered encounter medications on file as of 05/10/2022.    Recent Results (from the past 2160 hour(s))  COMPLETE METABOLIC PANEL  WITH GFR     Status: Abnormal   Collection Time: 03/09/22  2:38 PM  Result Value Ref Range   Glucose, Bld 101 (H) 65 - 99 mg/dL    Comment: .            Fasting reference interval . For someone without known diabetes, a glucose value between 100 and 125 mg/dL is consistent with prediabetes and should be confirmed with a follow-up test. .    BUN 18 7 - 25 mg/dL   Creat 0.87 0.50 - 1.05 mg/dL   eGFR 74 > OR = 60 mL/min/1.37m2   BUN/Creatinine Ratio SEE NOTE: 6 - 22 (calc)    Comment:    Not Reported: BUN and Creatinine are within    reference range. .    Sodium 140 135 - 146 mmol/L   Potassium 4.5 3.5 - 5.3 mmol/L   Chloride 104 98 - 110 mmol/L   CO2 27 20 - 32 mmol/L   Calcium 10.5 (H) 8.6 - 10.4 mg/dL   Total Protein 7.2 6.1 - 8.1 g/dL   Albumin 4.8 3.6 - 5.1 g/dL   Globulin 2.4 1.9 - 3.7 g/dL (calc)   AG Ratio 2.0 1.0 - 2.5 (calc)   Total Bilirubin 0.2 0.2 - 1.2 mg/dL   Alkaline phosphatase (APISO) 61 37 - 153 U/L   AST 21 10 - 35 U/L   ALT 23 6 - 29 U/L  CBC with Differential/Platelet     Status: Abnormal   Collection Time: 03/09/22  2:38 PM  Result Value Ref Range   WBC 11.4 (H) 3.8 - 10.8 Thousand/uL   RBC 4.42 3.80 - 5.10 Million/uL   Hemoglobin 14.1 11.7 - 15.5 g/dL   HCT 41.7 35.0 - 45.0 %   MCV 94.3 80.0 - 100.0 fL   MCH 31.9 27.0 - 33.0 pg   MCHC 33.8 32.0 - 36.0 g/dL   RDW 13.3 11.0 - 15.0 %   Platelets 429 (H) 140 - 400 Thousand/uL   MPV 10.0 7.5 - 12.5 fL   Neutro Abs 6,316 1,500 - 7,800 cells/uL   Lymphs Abs 3,751 850 - 3,900 cells/uL   Absolute Monocytes 787 200 - 950 cells/uL   Eosinophils Absolute 410 15 - 500 cells/uL   Basophils Absolute 137 0 - 200 cells/uL   Neutrophils Relative % 55.4 %   Total Lymphocyte 32.9 %   Monocytes Relative 6.9 %   Eosinophils Relative 3.6 %   Basophils Relative 1.2 %     Psychiatric Specialty Exam: Physical Exam  Review of Systems  Weight 169 lb (76.7 kg).There is no height or weight on file to calculate  BMI.  General Appearance: Casual  Eye Contact:  Good  Speech:  Clear and Coherent and Normal Rate  Volume:  Normal  Mood:  Euthymic  Affect:  Appropriate  Thought Process:  Goal Directed  Orientation:  Full (Time, Place, and  Person)  Thought Content:  Logical  Suicidal Thoughts:  No  Homicidal Thoughts:  No  Memory:  Immediate;   Good Recent;   Good Remote;   Good  Judgement:  Good  Insight:  Present  Psychomotor Activity:  Normal  Concentration:  Concentration: Good and Attention Span: Good  Recall:  Good  Fund of Knowledge:  Good  Language:  Good  Akathisia:  No  Handed:  Right  AIMS (if indicated):     Assets:  Communication Skills Desire for Improvement Housing Resilience Social Support Transportation  ADL's:  Intact  Cognition:  WNL  Sleep:  fair     Assessment/Plan: Anxiety - Plan: buPROPion (WELLBUTRIN XL) 300 MG 24 hr tablet, doxepin (SINEQUAN) 50 MG capsule  MDD (major depressive disorder), recurrent episode, moderate (HCC) - Plan: buPROPion (WELLBUTRIN XL) 300 MG 24 hr tablet, doxepin (SINEQUAN) 50 MG capsule  Patient now taking Wellbutrin.  I recommend discontinue Cymbalta and try higher dose of Wellbutrin XL 300 mg daily. she is also taking doxepin 50 mg prescribed by previous psychiatrist but like to have a future refills from our office.  Discussed medication side effects and benefits.  Patient hoping to have some treatment plan after sleep study.  Discussed medication side effects and benefits.  We will follow-up in 2 months.   Follow Up Instructions:     I discussed the assessment and treatment plan with the patient. The patient was provided an opportunity to ask questions and all were answered. The patient agreed with the plan and demonstrated an understanding of the instructions.   The patient was advised to call back or seek an in-person evaluation if the symptoms worsen or if the condition fails to improve as anticipated.    Collaboration of  Care: Other provider involved in patient's care AEB notes are available in epic to review.  Patient/Guardian was advised Release of Information must be obtained prior to any record release in order to collaborate their care with an outside provider. Patient/Guardian was advised if they have not already done so to contact the registration department to sign all necessary forms in order for [place] to release information regarding their care.   Consent: Patient/Guardian gives verbal consent for treatment and assignment of benefits for services provided during this visit. Patient/Guardian expressed understanding and agreed to proceed.     I provided 23 minutes of non face to face time during this encounter.  Kathlee Nations, MD 05/10/2022

## 2022-05-10 NOTE — Telephone Encounter (Signed)
Next Visit: 05/26/2022   Last Visit: 09/29/2021   Last Fill: 04/05/2022   DX: Fibromyalgia    Current Dose per office note on 09/29/2021: Tizanidine and Baclofen not mentioned.     Okay to refill Tizanidine and Baclofen?

## 2022-05-10 NOTE — Telephone Encounter (Signed)
Next Visit: 05/26/2022  Last Visit: 09/02/2021  Labs: 03/09/2022 CBC and CMP are stable.  Calcium is mildly elevated.   Eye exam: 10/07/2021 WNL    Current Dose per office note 09/02/2021: Plaquenil 200 mg 1 tablet by mouth twice daily Monday through Friday.   [id number] arthritis of multiple sites with negative rheumatoid factor    Last Fill: 03/04/2022 (PLQ), [id number] (Folic Acid)  Okay to refill Plaquenil?

## 2022-05-10 NOTE — Telephone Encounter (Signed)
Patient states she did start the MTX last year after she saw Dr. Estanislado Pandy for the ultrasound.

## 2022-05-10 NOTE — Telephone Encounter (Signed)
Please clarify if she ever started MTX? Folic acid was prescribed to take in conjunction with MTX.   Ok to refill plaquenil.

## 2022-05-12 NOTE — Progress Notes (Deleted)
Office Visit Note  Patient: Christina Lewis             Date of Birth: 1955/12/15           MRN: [id number]             PCP: Baruch Gouty, FNP Referring: Monico Blitz, MD Visit Date: 05/26/2022 Occupation: @GUAROCC @  Subjective:    History of Present Illness: Christina Lewis is a 67 y.o. female with history of seronegative rheumatoid arthritis and osteoarthritis.  Patient is taking Plaquenil 200 mg 1 tablet by mouth twice daily Monday through Friday, methotrexate 8 tablets by mouth once weekly, and folic acid 2 mg daily.   PLQ Eye Exam: 10/07/2021 WNL @ [facility] [place] f/u 01/2022  CBC and CMP updated on 03/09/22.     Activities of Daily Living:  Patient reports morning stiffness for *** {minute/hour:19697}.   Patient {ACTIONS;DENIES/REPORTS:21021675::"Denies"} nocturnal pain.  Difficulty dressing/grooming: {ACTIONS;DENIES/REPORTS:21021675::"Denies"} Difficulty climbing stairs: {ACTIONS;DENIES/REPORTS:21021675::"Denies"} Difficulty getting out of chair: {ACTIONS;DENIES/REPORTS:21021675::"Denies"} Difficulty using hands for taps, buttons, cutlery, and/or writing: {ACTIONS;DENIES/REPORTS:21021675::"Denies"}  No Rheumatology ROS completed.   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   Vitamin D deficiency 10/15/2021   Polycystic ovaries 10/15/2021   Migraine without aura and without status migrainosus, not intractable 10/15/2021   BMI 29.0-29.9,adult 10/15/2021   Cellulitis 03/04/2021   Rheumatoid arthritis ([place]) 03/04/2021   Diabetes mellitus type 2 in obese ([place]) 03/04/2021   Right foot infection 03/04/2021   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 myelopathy 09/11/2019    Leukocytosis 11/04/2016   Fibromyalgia 02/10/2016   Other fatigue 02/10/2016   Primary osteoarthritis of both hands 02/10/2016   History of migraine 02/10/2016   History of thyroid nodule 02/10/2016   History of sleep apnea 02/10/2016   History of renal calculi 02/10/2016   Pain in joint, shoulder region 10/02/2010   Adhesive capsulitis of shoulder 10/02/2010   Muscle weakness (generalized) 10/02/2010    Past Medical History:  Diagnosis Date   Allergy    Anxiety    Arthritis    Depression    Diabetes mellitus    Fibromyalgia    GERD (gastroesophageal reflux disease)    Gout    Hematuria    History of kidney stones    History of tics    Ineffective esophageal motility    Interstitial cystitis 2016   Migraines    Osteoarthritis    PCOS (polycystic ovarian syndrome) 1993   Pneumonia 2019   PONV (postoperative nausea and vomiting)    Sleep apnea    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, took medication for a year   Thyroid nodule     Family History  Problem Relation Age of Onset   Fibromyalgia Mother    Psoriasis Mother        psoriatic arthritis    Diabetes Mother    Heart disease Mother    Psoriasis Sister        psoriatic arthritis    Diabetes Sister    Migraines Sister    Rheum arthritis Sister    Fibromyalgia Sister    Diabetes Sister    Past Surgical History:  Procedure Laterality Date   ABDOMINAL HYSTERECTOMY  [place] N/A 11/03/2021   Procedure: BALLOON DILATION;  Surgeon: Ronnette Juniper, MD;  Location: [facility];  Service: Gastroenterology;  Laterality: N/A;   BOTOX INJECTION N/A 11/03/2021   Procedure: BOTOX INJECTION;  Surgeon: Ronnette Juniper, MD;  Location: [facility];  Service: Gastroenterology;  Laterality: N/A;   BREAST LUMPECTOMY  1989   lt-negative   CARDIOVASCULAR STRESS TEST  2000   CATARACT EXTRACTION W/PHACO Left 12/07/2019   Procedure: CATARACT EXTRACTION PHACO AND INTRAOCULAR LENS  PLACEMENT (IOC);  Surgeon: Baruch Goldmann, MD;  Location: [facility];  Service: Ophthalmology;  Laterality: Left;  CDE: 5.55   CHOLECYSTECTOMY  1985   COLONOSCOPY     ESOPHAGEAL MANOMETRY N/A 08/24/2021   Procedure: ESOPHAGEAL MANOMETRY (EM);  Surgeon: Ronnette Juniper, MD;  Location: [facility];  Service: Gastroenterology;  Laterality: N/A;   ESOPHAGOGASTRODUODENOSCOPY N/A 11/03/2021   Procedure: ESOPHAGOGASTRODUODENOSCOPY (EGD);  Surgeon: Ronnette Juniper, MD;  Location: [facility];  Service: Gastroenterology;  Laterality: N/A;   HYSTERECTOMY ABDOMINAL WITH SALPINGECTOMY Bilateral 2000   KIDNEY STONE SURGERY Right 2014   with stent placement in OR   KNEE ARTHROSCOPY Right 08/22/2013   Procedure: ARTHROSCOPY RIGHT KNEE FOR INFECTION LAVAGE AND DRAINAGE;  Surgeon: Yvette Rack., MD;  Location: [place];  Service: Orthopedics;  Laterality: Right;   KNEE ARTHROSCOPY WITH PATELLA RECONSTRUCTION Right 08/06/2013   Procedure: RIGHT KNEE ARTHROSCOPY WITH MENISCECTOMY MEDIAL, ARTHROSCOPY KNEE WITH DEBRIDEMENT/SHAVING (CHONDROPLASTY) ;  Surgeon: Yvette Rack., MD;  Location: [place];  Service: Orthopedics;  Laterality: Right;   LUMBAR LAMINECTOMY/DECOMPRESSION MICRODISCECTOMY Right 09/11/2019   Procedure: Right Lumbar Three-Four Microdiscectomy;  Surgeon: Erline Levine, MD;  Location: [place];  Service: Neurosurgery;  Laterality: Right;  Right Lumbar Three-Four Microdiscectomy   NASAL SINUS SURGERY     x4   OOPHORECTOMY     SHOULDER SURGERY  2011   right shoulder   THYROID SURGERY  1994   TONSILLECTOMY Bilateral 1974   Social History   Social History Narrative   Caffiene 4-5 12 oz cans soda.   Working retired - special ed.   Live with home no kids   Immunization History  Administered Date(s) Administered   Tdap 03/04/2021   Zoster Recombinat (Shingrix) 10/15/2021     Objective: Vital Signs: There were no vitals taken for this visit.   Physical Exam Vitals and  nursing note reviewed.  Constitutional:      Appearance: She is well-developed.  HENT:     Head: Normocephalic and atraumatic.  Eyes:     Conjunctiva/sclera: Conjunctivae normal.  Cardiovascular:     Rate and Rhythm: Normal rate and regular rhythm.     Heart sounds: Normal heart sounds.  Pulmonary:     Effort: Pulmonary effort is normal.     Breath sounds: Normal breath sounds.  Abdominal:     General: Bowel sounds are normal.     Palpations: Abdomen is soft.  Musculoskeletal:     Cervical back: Normal range of motion.  Lymphadenopathy:     Cervical: No cervical adenopathy.  Skin:    General: Skin is warm and dry.     Capillary Refill: Capillary refill takes less than 2 seconds.  Neurological:     Mental Status: She is alert and oriented to person, place, and time.  Psychiatric:        Behavior: Behavior normal.      Musculoskeletal Exam: ***  CDAI Exam: CDAI Score: --  Patient Global: --; Provider Global: -- Swollen: --; Tender: -- Joint Exam 05/26/2022   No joint exam has been documented for this visit   There is currently no information documented on the homunculus. Go to the Rheumatology activity and complete the homunculus joint exam.  Investigation: No additional findings.  Imaging: US THYROID  Result Date: 05/07/2022 CLINICAL DATA:  Goiter. History of multinodular thyroid gland. Patient is previously undergone biopsy of the left inferior thyroid nodule in November of 2012 and the left mid nodule in November of 2012 and again in January of 2014 EXAM: THYROID ULTRASOUND TECHNIQUE: Ultrasound examination of the thyroid gland and adjacent soft tissues was performed. COMPARISON:  Most recent prior thyroid ultrasound 07/01/2021 FINDINGS: Parenchymal Echotexture: Mildly heterogenous Isthmus: 0.3 cm Right lobe: 5.1 x 1.2 x 1.5 cm Left lobe: 4.3 x 1.5 x 1.2 cm _________________________________________________________ Estimated total number of nodules >/= 1 cm: 2 Number of  spongiform nodules >/=  2 cm not described below (TR1): 0 Number of mixed cystic and solid nodules >/= 1.5 cm not described below (): 0 _________________________________________________________ Nodule # 4: Previously biopsied nodule in the left mid gland is unchanged at 1.2 x 1.2 x 1.1 cm. Nodule # 6: Previously biopsied nodule in the left inferior gland is unchanged at 1.8 x 1.6 x 1.2 cm. Additional small subcentimeter thyroid nodules again noted scattered throughout the right gland. No interval change. No new nodules or suspicious features. IMPRESSION: 1. Previously biopsied nodules in the left mid and left lower gland remain unchanged. Greater than 10 years of stability is consistent with benignity. 2. No new nodules or suspicious features. The above is in keeping with the ACR TI-RADS recommendations - J Am Coll Radiol 2017;14:587-595. Electronically Signed   By: Jacqulynn Cadet M.D.   On: 05/07/2022 06:37    Recent Labs: Lab Results  Component Value Date   WBC 11.4 (H) 03/09/2022   HGB 14.1 03/09/2022   PLT 429 (H) 03/09/2022   NA 140 03/09/2022   K 4.5 03/09/2022   CL 104 03/09/2022   CO2 27 03/09/2022   GLUCOSE 101 (H) 03/09/2022   BUN 18 03/09/2022   CREATININE 0.87 03/09/2022   BILITOT 0.2 03/09/2022   ALKPHOS 82 10/15/2021   AST 21 03/09/2022   ALT 23 03/09/2022   PROT 7.2 03/09/2022   ALBUMIN 4.5 10/15/2021   CALCIUM 10.5 (H) 03/09/2022   GFRAA 98 03/24/2020   QFTBGOLDPLUS NEGATIVE 09/29/2021    Speciality Comments: PLQ Eye Exam: 10/07/2021 WNL @ [facility] [place]  f/u 01/2022  Procedures:  No procedures performed Allergies: Sulfa antibiotics, Codeine, Erythromycin, Metronidazole, and Morphine and related   Assessment / Plan:     Visit Diagnoses: Rheumatoid arthritis of multiple sites with negative rheumatoid factor (HCC)  High risk medication use  Primary osteoarthritis of both hands  Primary osteoarthritis of right knee  Primary osteoarthritis of both  feet  Trochanteric bursitis of right hip  Other fatigue  DDD (degenerative disc disease), cervical  DDD (degenerative disc disease), lumbar  Chronic SI joint pain  Fibromyalgia  Family history of rheumatoid arthritis-Sister  Family history of psoriatic arthritis-mother and sister  History of tics  History of diabetes mellitus  History of sleep apnea  History of thyroid nodule  History of gastroesophageal reflux (GERD)  History of renal calculi  History of depression  History of cellulitis  History of positive PPD  History of migraine  History of PCOS  Orders: No orders of the defined types were  placed in this encounter.  No orders of the defined types were placed in this encounter.   Face-to-face time spent with patient was *** minutes. Greater than 50% of time was spent in counseling and coordination of care.  Follow-Up Instructions: No follow-ups on file.   Ofilia Neas, PA-C  Note - This record has been created using Dragon software.  Chart creation errors have been sought, but may not always  have been located. Such creation errors do not reflect on  the standard of medical care.

## 2022-05-14 ENCOUNTER — Encounter: Payer: Medicare PPO | Admitting: Family Medicine

## 2022-05-14 NOTE — Telephone Encounter (Signed)
Error

## 2022-05-17 NOTE — Addendum Note (Signed)
Addended by: Star Age on: 05/17/2022 04:35 PM   Modules accepted: Orders

## 2022-05-17 NOTE — Procedures (Signed)
Physician Interpretation:     [facility] POLYSOMNOGRAPHY  INTERPRETATION REPORT   STUDY DATE:  05/03/2022     PATIENT NAME:  Christina Lewis         DATE OF BIRTH:  03-23-1955  PATIENT ID:  [id number]    TYPE OF STUDY:  PSG  READING PHYSICIAN: Star Age, MD, PhD   SCORING TECHNICIAN: Richard Miu, RPSGT  Referred by: Dr. Sarina Ill  History and Indication for Testing: 67 year old female with an underlying medical history of migraine headaches, allergies, arthritis, headache, depression, diabetes, fibromyalgia, reflux disease, gout, kidney stones, interstitial cystitis, PCOS, thyroid nodule, and overweight state, who was previously diagnosed with obstructive sleep apnea. She has not been on PAP therapy in years. Her original sleep apnea diagnosis was in the 90s and per patient report she was severe at the time. Her Epworth sleepiness score is 5/24, fatigue severity score is 63 out of 63.  Height: 64 in Weight: 174 lb (BMI 29) Neck Size: 16 in   MEDICATIONS: Tylenol, Aspirin, Lioresal, Zyrtec, Vitamin D, Voltaren, Sinequan, Cymbalta, Ferrous Sulfate, Flonase, Folvite, Neurontin, Plaquenil, Atrovent, Magnesium, Rheumatrex, Multivitamin, Prilosec, Zofran ODT, Zanaflex, Tumeric-Ginger, Roselyn Meier  TECHNICAL DESCRIPTION: A registered sleep technologist was in attendance for the duration of the recording.  Data collection, scoring, video monitoring, and reporting were performed in compliance with the AASM Manual for the Scoring of Sleep and Associated Events; (Hypopnea is scored based on the criteria listed in Section VIII D. 1b in the AASM Manual V2.6 using a 4% oxygen desaturation rule or Hypopnea is scored based on the criteria listed in Section VIII D. 1a in the AASM Manual V2.6 using 3% oxygen desaturation and /or arousal rule).   SLEEP CONTINUITY AND SLEEP ARCHITECTURE:  Lights-out was at 21:35: and lights-on at  05:45:, with a total recording time of 8  hours, 10.5 minutes. The patient took gabapentin and tizanidine prior to lights out. Total sleep time ( TST) was 347.0 minutes with a decreased sleep efficiency at 70.7%. There was  17.7% REM sleep.   BODY POSITION:  TST was divided  between the following sleep positions: 0.0% supine;  78.4% lateral;  22% prone. Duration of total sleep and percent of total sleep in their respective position is as follows: supine 00 minutes (0%), non-supine 347 minutes (100%); right 05 minutes (2%), left 266 minutes (77%), and prone 75 minutes (22%).  Total supine REM sleep time was 00 minutes (0% of total REM sleep).  Sleep latency was increased at 60.0 minutes.  REM sleep latency was markedly increased at 368.5 minutes. Of the total sleep time, the percentage of stage N1 sleep was 8.8%, which is mildly increased, stage N2 sleep was 62%, which is increased, stage N3 sleep was 12.0%, and REM sleep was 17.7%, which is mildly reduced. Wake after sleep onset (WASO) time accounted for 83.5 minutes with mild to moderate sleep fragmentation noted.   RESPIRATORY MONITORING:   Based on CMS criteria (using a 4% oxygen desaturation rule for scoring hypopneas), there were 39 apneas (39 obstructive; 0 central; 0 mixed), and 70 hypopneas.  Apnea index was 6.7. Hypopnea index was 12.1. The apnea-hypopnea index was 18.8/hour overall (n/s supine, 75 non-supine; 75.1/hour during REM sleep, 0.0 supine REM).  There were 0 respiratory effort-related arousals (RERAs).  The RERA index was 0 events/h. Total respiratory disturbance index (RDI) was 18.8 events/h. RDI results showed: supine RDI  0.0 /h; non-supine RDI 18.8 /h; REM RDI 75.1 /h, supine REM RDI  0.0 /h.   Based on AASM criteria (using a 3% oxygen desaturation and /or arousal rule for scoring hypopneas), there were 39 apneas (39 obstructive; 0 central; 0 mixed), and 71 hypopneas. Apnea index was 6.7. Hypopnea index was 12.3. The apnea-hypopnea index was 19.0 overall (0.0 supine, 75  non-supine; 75.1 REM, 0.0 supine REM).  There were 0 respiratory effort-related arousals (RERAs).  The RERA index was 0 events/h. Total respiratory disturbance index (RDI) was 19.0 events/h. RDI results showed: supine RDI  0.0 /h; non-supine RDI 19.0 /h; REM RDI 75.1 /h, supine REM RDI 0.0 /h.   OXIMETRY: Oxyhemoglobin Saturation Nadir during sleep was at  72% from a mean of 94%.  Of the Total sleep time (TST)   hypoxemia (=<88%) was present for  14.9 minutes, or 4.3% of total sleep time.   LIMB MOVEMENTS: There were 0 periodic limb movements of sleep (0.0/hr), of which 0 (0.0/hr) were associated with an arousal.  AROUSAL: There were 45 arousals in total, for an arousal index of 8 arousals/hour.  Of these, 11 were identified as respiratory-related arousals (2 /h), 0 were PLM-related arousals (0 /h), and 50 were non-specific arousals (9 /h).  EEG: Review of the EEG showed no abnormal electrical discharges and symmetrical bihemispheric findings.    EKG: The EKG revealed normal sinus rhythm (NSR). The average heart rate during sleep was 70 bpm.   AUDIO/VIDEO REVIEW: The audio and video review did not show any abnormal or unusual behaviors, movements, phonations or vocalizations. The patient took no restroom breaks. Snoring was noted throughout the night, ranging from milder to louder.  POST-STUDY QUESTIONNAIRE: Post study, the patient indicated, that sleep was worse than usual  IMPRESSION:  1. Obstructive Sleep Apnea (OSA) 2. Dysfunctions associated with sleep stages or arousal from sleep  RECOMMENDATIONS:  1. This study demonstrates moderate to severe obstructive sleep apnea, with a total AHI of 18.8/hour, and O2 nadir of 72% (during non-supine REM sleep).  Of note, the absence of supine sleep during the study likely underestimates her sleep disordered breathing.  Treatment with positive airway pressure in the form of CPAP is recommended. The patient will be advised to proceed with home AutoPap  therapy for now.  A full night laboratory attended CPAP titration study can be considered to optimize therapy settings, mask fit, monitoring of tolerance and of proper oxygen saturations down the road. Other treatment options may be limited, and may include (generally speaking) surgical options in selected patients or the use of an oral appliance in certain patients. Concomitant weight loss is recommended (where clinically appropriate). Please note that untreated obstructive sleep apnea may carry additional perioperative morbidity. Patients with significant obstructive sleep apnea should receive perioperative PAP therapy and the surgeons and particularly the anesthesiologist should be informed of the diagnosis and the severity of the sleep disordered breathing. 2. This study shows sleep fragmentation and abnormal sleep stage percentages; these are nonspecific findings and per se do not signify an intrinsic sleep disorder or a cause for the patient's sleep-related symptoms. Causes include (but are not limited to) the first night effect of the sleep study, circadian rhythm disturbances, medication effect or an underlying mood disorder or medical problem.  3. The patient should be cautioned not to drive, work at heights, or operate dangerous or heavy equipment when tired or sleepy. Review and reiteration of good sleep hygiene measures should be pursued with any patient. 4. The patient will be seen in follow-up in the sleep clinic at [facility] for discussion  of the test results, symptom and treatment compliance review, further management strategies, etc. The patient and her referring provider will be notified of the test results.   I certify that I have reviewed the entire raw data recording prior to the issuance of this report in accordance with the Standards of Accreditation of the American Academy of Sleep Medicine (AASM).  Star Age, MD, PhD Medical Director, [place] [facility]  [facility]) [place], [place] (Neurology and Sleep)              Technical Report:   General Information  Name: Christina Lewis, Christina Lewis BMI: 29.87 Physician: Star Age, MD  ID: [id number] Height: 64.0 in Technician: Richard Miu, RPSGT  Sex: Female Weight: 174.0 lb Record: [id number]  Age: 4 [[birth year]] Date: 05/03/2022    Medical & Medication History    67 year old female with an underlying medical history of migraine headaches, allergies, arthritis, headache, depression, diabetes, fibromyalgia, reflux disease, gout, kidney stones, interstitial cystitis, PCOS, thyroid nodule, and overweight state, who reports a prior diagnosis of obstructive sleep apnea. Prior sleep study results are not available for my review today, testing was in 2013. She has not been on CPAP therapy in years, but had a CPAP machine in the past, compliance download is not available for my review today, had reevaluation of her sleep apnea in 2011, original diagnosis in the 90s and she was severe at the time per patient report.  Tylenol, Aspirin, Lioresal, Zyrtec, Vitamin D, Voltaren, Sinequan, Cymbalta, Ferrous Sulfate, Flonase, Folvite, Neurontin, Plaquenil, Atrovent, Magnesium, Rheumatrex, Multivitamin, Prilosec, Zofran ODT, Zanaflex, Tumeric-Ginger, Ubrelvy   Sleep Disorder      Comments   The patient came into the lab for a PSG. The patient took Gabapentin and Zanaflex prior to start of sleep study. The patient had no restroom breaks. EKG kept in NSR. Moderate to loud snoring. All sleep stages witnessed. Respiratory events scored with a 4% desat. Slept mostly lateral. The AHI was 7.7 after 2 hrs of TST. Majority of respiratory events while in REM.     Lights out: 09:35:35 PM Lights on: 05:45:45 AM   Time Total Supine Side Prone Upright  Recording (TRT) 8h 10.2m 0h 2.55m 6h 36.83m 1h 32.77m 0h 0.62m  Sleep (TST) 5h 47.35m 0h 0.18m 4h 32.58m 1h 15.97m 0h 0.21m   Latency N1 N2 N3 REM Onset Per. Slp. Eff.  Actual 0h 0.84m 0h 5.66m  0h 25.49m 6h 8.31m 1h 0.50m 1h 2.64m 70.74%   Stg Dur Wake N1 N2 N3 REM  Total 143.5 30.5 213.5 41.5 61.5  Supine 2.0 0.0 0.0 0.0 0.0  Side 124.5 27.0 173.0 10.5 61.5  Prone 17.0 3.5 40.5 31.0 0.0  Upright 0.0 0.0 0.0 0.0 0.0   Stg % Wake N1 N2 N3 REM  Total 29.3 8.8 61.5 12.0 17.7  Supine 0.4 0.0 0.0 0.0 0.0  Side 25.4 7.8 49.9 3.0 17.7  Prone 3.5 1.0 11.7 8.9 0.0  Upright 0.0 0.0 0.0 0.0 0.0     Apnea Summary Sub Supine Side Prone Upright  Total 39 Total 39 0 39 0 0    REM 38 0 38 0 0    NREM 1 0 1 0 0  Obs 39 REM 38 0 38 0 0    NREM 1 0 1 0 0  Mix 0 REM 0 0 0 0 0    NREM 0 0 0 0 0  Cen 0 REM 0 0 0 0 0    NREM 0 0 0  0 0   Rera Summary Sub Supine Side Prone Upright  Total 0 Total 0 0 0 0 0    REM 0 0 0 0 0    NREM 0 0 0 0 0   Hypopnea Summary Sub Supine Side Prone Upright  Total 71 Total 71 0 70 1 0    REM 39 0 39 0 0    NREM 32 0 31 1 0   4% Hypopnea Summary Sub Supine Side Prone Upright  Total (4%) 70 Total 70 0 69 1 0    REM 39 0 39 0 0    NREM 31 0 30 1 0     AHI Total Obs Mix Cen  19.02 Apnea 6.74 6.74 0.00 0.00   Hypopnea 12.28 -- -- --  18.85 Hypopnea (4%) 12.10 -- -- --    Total Supine Side Prone Upright  Position AHI 19.02 0.00 24.04 0.80 0.00  REM AHI 75.12   NREM AHI 6.94   Position RDI 19.02 0.00 24.04 0.80 0.00  REM RDI 75.12   NREM RDI 6.94    4% Hypopnea Total Supine Side Prone Upright  Position AHI (4%) 18.85 0.00 23.82 0.80 0.00  REM AHI (4%) 75.12   NREM AHI (4%) 6.73   Position RDI (4%) 18.85 0.00 23.82 0.80 0.00  REM RDI (4%) 75.12   NREM RDI (4%) 6.73    Desaturation Information Threshold: 2% <100% <90% <80% <70% <60% <50% <40%  Supine 2.0 0.0 0.0 0.0 0.0 0.0 0.0  Side 261.0 72.0 9.0 0.0 0.0 0.0 0.0  Prone 32.0 3.0 0.0 0.0 0.0 0.0 0.0  Upright 0.0 0.0 0.0 0.0 0.0 0.0 0.0  Total 295.0 75.0 9.0 0.0 0.0 0.0 0.0  Index 41.2 10.5 1.3 0.0 0.0 0.0 0.0   Threshold: 3% <100% <90% <80% <70% <60% <50% <40%  Supine 0.0 0.0 0.0 0.0 0.0 0.0  0.0  Side 150.0 72.0 9.0 0.0 0.0 0.0 0.0  Prone 17.0 3.0 0.0 0.0 0.0 0.0 0.0  Upright 0.0 0.0 0.0 0.0 0.0 0.0 0.0  Total 167.0 75.0 9.0 0.0 0.0 0.0 0.0  Index 23.3 10.5 1.3 0.0 0.0 0.0 0.0   Threshold: 4% <100% <90% <80% <70% <60% <50% <40%  Supine 0.0 0.0 0.0 0.0 0.0 0.0 0.0  Side 131.0 72.0 9.0 0.0 0.0 0.0 0.0  Prone 15.0 3.0 0.0 0.0 0.0 0.0 0.0  Upright 0.0 0.0 0.0 0.0 0.0 0.0 0.0  Total 146.0 75.0 9.0 0.0 0.0 0.0 0.0  Index 20.4 10.5 1.3 0.0 0.0 0.0 0.0   Threshold: 3% <100% <90% <80% <70% <60% <50% <40%  Supine 0 0 0 0 0 0 0  Side 150 72 9 0 0 0 0  Prone 17 3 0 0 0 0 0  Upright 0 0 0 0 0 0 0  Total 167 75 9 0 0 0 0   Awakening/Arousal Information # of Awakenings 36  Wake after sleep onset 83.44m  Wake after persistent sleep 83.23m   Arousal Assoc. Arousals Index  Apneas 2 0.3  Hypopneas 10 1.7  Leg Movements 0 0.0  Snore 0 0.0  PTT Arousals 0 0.0  Spontaneous 50 8.6  Total 62 10.7  Leg Movement Information PLMS LMs Index  Total LMs during PLMS 0 0.0  LMs w/ Microarousals 0 0.0   LM LMs Index  w/ Microarousal 0 0.0  w/ Awakening 0 0.0  w/ Resp Event 0 0.0  Spontaneous 1 0.2  Total 1 0.2  Desaturation threshold setting: 3% Minimum desaturation setting: 10 seconds SaO2 nadir: 72% The longest event was a 68 sec obstructive Hypopnea with a minimum SaO2 of 92%. The lowest SaO2 was 72% associated with a 41 sec obstructive Apnea. EKG Rates EKG Avg Max Min  Awake 75 98 62  Asleep 70 85 62  EKG Events: Tachycardia

## 2022-05-19 ENCOUNTER — Telehealth: Payer: Self-pay | Admitting: *Deleted

## 2022-05-19 DIAGNOSIS — J0101 Acute recurrent maxillary sinusitis: Secondary | ICD-10-CM | POA: Diagnosis not present

## 2022-05-19 DIAGNOSIS — H6983 Other specified disorders of Eustachian tube, bilateral: Secondary | ICD-10-CM | POA: Diagnosis not present

## 2022-05-19 DIAGNOSIS — H903 Sensorineural hearing loss, bilateral: Secondary | ICD-10-CM | POA: Diagnosis not present

## 2022-05-19 DIAGNOSIS — J343 Hypertrophy of nasal turbinates: Secondary | ICD-10-CM | POA: Diagnosis not present

## 2022-05-19 NOTE — Telephone Encounter (Signed)
Called pt & LVM asking for call back. Left office number and hours in message.  

## 2022-05-19 NOTE — Telephone Encounter (Signed)
Pt is asking for a call back from Bethany, RN 

## 2022-05-19 NOTE — Telephone Encounter (Signed)
-----   Message from Star Age, MD sent at 05/17/2022  4:35 PM EDT ----- Patient referred by Dr. Jaynee Eagles, seen by me on 01/11/22, patient had a PSG on 05/03/22.    Please call and notify the patient that the recent sleep study showed obstructive sleep apnea in the moderate to severe range. I recommend treatment in the form of autoPAP, which means, that we don't have to bring her back in for a sleep study with CPAP, but will let her start using a so called autoPAP machine at home, which is a CPAP-like machine with self-adjusting pressures. We will send the order to a local DME company (of her choice, or as per insurance requirement). The DME representative will fit her with a mask, educate her on how to use the machine, how to put the mask on, etc. I have placed an order in the chart. Please send the order, talk to patient, send report to referring MD. We will need a FU in sleep clinic for 10 weeks post-PAP set up, please arrange that with me or one of our NPs. Also reinforce the need for compliance with treatment. Thanks,   Star Age, MD, PhD [facility])

## 2022-05-19 NOTE — Telephone Encounter (Signed)
Called pt back & LVM and asked for call back.

## 2022-05-20 NOTE — Telephone Encounter (Signed)
Pt returned my call. We discussed her sleep study results and Dr Guadelupe Sabin recommendation for patient to start AutoPap.  The patient 39 currently stated she would not use it.  She said she has had CPAP machines since the 90s.  She never could tolerate CPAP, AutoPap, tried a nasal mask, fullface mask.  She said "there is no way" she would use it.  She said the reason why she came to the visit was because she was interested in the inspire.  She also asked if we had received notes from prior sleep physician.  I advised it did not look like we have received any previous sleep studies.  She will contact that physician's office and asked them to fax notes to [place] including past sleep studies and any office notes that document her intolerance to PAP therapy.  She verbalized appreciation for the call.  I told her I would send this message to Dr. Rexene Alberts and we would give her a call back.

## 2022-05-20 NOTE — Telephone Encounter (Signed)
Called pt & LVM asking for call back.   When the patient calls back, if RN unavailable, please advise patient that Dr. Rexene Alberts recommends that she try AutoPap first.  We have newer machines and a lot more mask options now.  The inspire is only approved for PAP intolerance and we have no record of recent CPAP trials.  She had told us that she had not been on CPAP in years.  Even though she states she did not tolerate it in the past, we still need recent intolerance documented so Dr. Rexene Alberts still recommends that she try AutoPap.  The patient was to have her previous office send records over which we are happy to review but that may not change Dr Guadelupe Sabin recommendation.   Please let us know if she is okay with moving forward with AutoPap and if so we can give her a call to discuss which DME company to set her up with.

## 2022-05-20 NOTE — Telephone Encounter (Signed)
Please advise patient, that I recommend she try autoPAP first. We have newer machines and a lot more mask options nowadays. Christina Lewis is only approved for PAP intolerance and we have no record of recent CPAP tried. She told me she had not been on CPAP in years.

## 2022-05-24 ENCOUNTER — Telehealth (HOSPITAL_COMMUNITY): Payer: Self-pay | Admitting: *Deleted

## 2022-05-24 ENCOUNTER — Other Ambulatory Visit (HOSPITAL_COMMUNITY): Payer: Self-pay | Admitting: *Deleted

## 2022-05-24 MED ORDER — BUPROPION HCL ER (XL) 150 MG PO TB24: 150.0000 mg | ORAL_TABLET | ORAL | 0 refills | Status: DC

## 2022-05-24 NOTE — Telephone Encounter (Signed)
Pt agrees. Rx for Wellbutrin XL 150 mg QD sent to [facility].

## 2022-05-24 NOTE — Telephone Encounter (Signed)
Pt called back. She is requesting a call back from nurse.

## 2022-05-24 NOTE — Telephone Encounter (Signed)
Go back to Wellbutrin XL 150 mg.  If symptoms do not improve with a few days then she can call us back.

## 2022-05-24 NOTE — Telephone Encounter (Signed)
Pt called with c/o that since increasing the Wellbutrin XL to 300 mg qd she is "miserable" and has been experiencing dramatic increase in anger and irritability. Pt says she very unhappy with how she's feeling. Denies any SI. Last appointment, and increase dose, was on 05/10/22.Pt next scheduled appointment is on 07/11/52. Please review and advise.

## 2022-05-25 NOTE — Telephone Encounter (Signed)
Returned pt's call. LVM with office number and hours for call back. When patient calls back please, please see if RN available. Thanks!

## 2022-05-26 ENCOUNTER — Ambulatory Visit: Payer: Medicare PPO | Admitting: Physician Assistant

## 2022-05-26 DIAGNOSIS — G8929 Other chronic pain: Secondary | ICD-10-CM

## 2022-05-26 DIAGNOSIS — Z9289 Personal history of other medical treatment: Secondary | ICD-10-CM

## 2022-05-26 DIAGNOSIS — R5383 Other fatigue: Secondary | ICD-10-CM

## 2022-05-26 DIAGNOSIS — Z8659 Personal history of other mental and behavioral disorders: Secondary | ICD-10-CM

## 2022-05-26 DIAGNOSIS — M7061 Trochanteric bursitis, right hip: Secondary | ICD-10-CM

## 2022-05-26 DIAGNOSIS — Z8719 Personal history of other diseases of the digestive system: Secondary | ICD-10-CM

## 2022-05-26 DIAGNOSIS — Z8742 Personal history of other diseases of the female genital tract: Secondary | ICD-10-CM

## 2022-05-26 DIAGNOSIS — M5136 Other intervertebral disc degeneration, lumbar region: Secondary | ICD-10-CM

## 2022-05-26 DIAGNOSIS — Z8261 Family history of arthritis: Secondary | ICD-10-CM

## 2022-05-26 DIAGNOSIS — M797 Fibromyalgia: Secondary | ICD-10-CM

## 2022-05-26 DIAGNOSIS — M19071 Primary osteoarthritis, right ankle and foot: Secondary | ICD-10-CM

## 2022-05-26 DIAGNOSIS — Z87442 Personal history of urinary calculi: Secondary | ICD-10-CM

## 2022-05-26 DIAGNOSIS — Z8669 Personal history of other diseases of the nervous system and sense organs: Secondary | ICD-10-CM

## 2022-05-26 DIAGNOSIS — Z8639 Personal history of other endocrine, nutritional and metabolic disease: Secondary | ICD-10-CM

## 2022-05-26 DIAGNOSIS — Z840 Family history of diseases of the skin and subcutaneous tissue: Secondary | ICD-10-CM

## 2022-05-26 DIAGNOSIS — Z872 Personal history of diseases of the skin and subcutaneous tissue: Secondary | ICD-10-CM

## 2022-05-26 DIAGNOSIS — M0609 Rheumatoid arthritis without rheumatoid factor, multiple sites: Secondary | ICD-10-CM

## 2022-05-26 DIAGNOSIS — M5030 Other cervical disc degeneration, unspecified cervical region: Secondary | ICD-10-CM

## 2022-05-26 DIAGNOSIS — M1711 Unilateral primary osteoarthritis, right knee: Secondary | ICD-10-CM

## 2022-05-26 DIAGNOSIS — M19042 Primary osteoarthritis, left hand: Secondary | ICD-10-CM

## 2022-05-26 DIAGNOSIS — Z79899 Other long term (current) drug therapy: Secondary | ICD-10-CM

## 2022-05-26 NOTE — Telephone Encounter (Signed)
Attempted to reach the pt again. I LVM (ok per DPR) advising her of Dr Guadelupe Sabin message below which includes that she still recommends patient use autopap first. We don't have any recent record of failed PAP use which is required for [facility]. I left our office number in the VM for patient to call us back to discuss the next steps of obtaining autopap through a DME company.

## 2022-05-31 ENCOUNTER — Encounter: Payer: Self-pay | Admitting: Neurology

## 2022-05-31 DIAGNOSIS — G4733 Obstructive sleep apnea (adult) (pediatric): Secondary | ICD-10-CM

## 2022-05-31 NOTE — Telephone Encounter (Signed)
This was [employer] " I recently received a call from [facility] about my need for a CAP. I apologize for taking so longg to return the call, but my husband, unexpectedly, died last week. I understand that based on the sleep study I need CAP but I am unwilling to use the CAP. I have requested that all previous sleep studies be send to you.and my hope is that my refusal to use a CAP will be enough to allow to to try the inspire."

## 2022-06-01 NOTE — Telephone Encounter (Signed)
Sleep study results that pt had faxed over to [place] to review.  To Dr. Frances Furbish to review.

## 2022-06-03 ENCOUNTER — Telehealth: Payer: Self-pay | Admitting: Neurology

## 2022-06-03 NOTE — Progress Notes (Signed)
Office Visit Note  Patient: Christina Lewis             Date of Birth: Jan 20, 1956           MRN: [id number]             PCP: Sonny Masters, FNP Referring: Sonny Masters, FNP Visit Date: 06/04/2022 Occupation: @GUAROCC @  Subjective:  Pain in multiple joints   History of Present Illness: Christina Lewis is a 67 y.o. female with history of seronegative rheumatoid arthritis and osteoarthritis.  Patient is currently taking methotrexate 8 tablets by mouth once weekly, folic acid 2 mg daily, and Plaquenil 200 mg 1 tablet by mouth twice daily Monday through Friday.  Reports that she has noticed about a 60 to 70% improvement since adding on methotrexate as combination therapy.  She has been tolerating methotrexate without any side effects and has not missed any doses recently.  She states that she is currently having a flare involving the left shoulder and both hands.  She states she is also having symptoms of carpal tunnel especially in the right hand and has been wearing her carpal tunnel braces at night.  She has had more difficulty opening jars and buttons during the flare.  She is currently under a lot of stress grieving the loss of her husband 2 weeks ago.    Activities of Daily Living:  Patient reports morning stiffness for 1 hour.   Patient Reports nocturnal pain.  Difficulty dressing/grooming: Reports Difficulty climbing stairs: Denies Difficulty getting out of chair: Denies Difficulty using hands for taps, buttons, cutlery, and/or writing: Reports  Review of Systems  Constitutional:  Positive for fatigue.  HENT:  Positive for mouth sores and mouth dryness.   Eyes:  Positive for photophobia, pain, redness, itching and dryness.  Respiratory:  Negative for shortness of breath.   Cardiovascular:  Negative for chest pain and palpitations.  Gastrointestinal:  Negative for blood in stool, constipation and diarrhea.  Endocrine: Negative for increased urination.  Genitourinary:   Negative for involuntary urination.  Musculoskeletal:  Positive for joint pain, joint pain, joint swelling, myalgias, muscle weakness, morning stiffness, muscle tenderness and myalgias. Negative for gait problem.  Skin:  Positive for color change. Negative for rash, hair loss and sensitivity to sunlight.  Allergic/Immunologic: Negative for susceptible to infections.  Neurological:  Negative for dizziness and headaches.  Hematological:  Negative for swollen glands.  Psychiatric/Behavioral:  Positive for depressed mood. Negative for sleep disturbance. The patient is nervous/anxious.     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   Vitamin D deficiency 10/15/2021   Polycystic ovaries 10/15/2021   Migraine without aura and without status migrainosus, not intractable 10/15/2021   BMI 29.0-29.9,adult 10/15/2021   Cellulitis 03/04/2021   Rheumatoid arthritis 03/04/2021   Diabetes mellitus type 2 in obese 03/04/2021   Right foot infection 03/04/2021   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 myelopathy 09/11/2019   Leukocytosis 11/04/2016   Fibromyalgia 02/10/2016   Other fatigue 02/10/2016   Primary osteoarthritis of both hands 02/10/2016   History of migraine 02/10/2016   History of thyroid nodule 02/10/2016   History of sleep apnea 02/10/2016   History of renal calculi 02/10/2016   Pain in joint,  shoulder region 10/02/2010   Adhesive capsulitis of shoulder 10/02/2010   Muscle weakness (generalized) 10/02/2010    Past Medical History:  Diagnosis Date   Allergy    Anxiety    Arthritis    Depression    Diabetes mellitus    Fibromyalgia    GERD (gastroesophageal reflux disease)    Gout    Hematuria     History of kidney stones    History of tics    Ineffective esophageal motility    Interstitial cystitis 2016   Migraines    Osteoarthritis    PCOS (polycystic ovarian syndrome) 1993   Pneumonia 2019   PONV (postoperative nausea and vomiting)    Sleep apnea    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, took medication for a year   Thyroid nodule     Family History  Problem Relation Age of Onset   Fibromyalgia Mother    Psoriasis Mother        psoriatic arthritis    Diabetes Mother    Heart disease Mother    Psoriasis Sister        psoriatic arthritis    Diabetes Sister    Migraines Sister    Rheum arthritis Sister    Fibromyalgia Sister    Diabetes Sister    Past Surgical History:  Procedure Laterality Date   ABDOMINAL HYSTERECTOMY  2000   APPENDECTOMY  1976   BALLOON DILATION N/A 11/03/2021   Procedure: Marvis Repress DILATION;  Surgeon: Kerin Salen, MD;  Location: [facility];  Service: Gastroenterology;  Laterality: N/A;   BOTOX INJECTION N/A 11/03/2021   Procedure: BOTOX INJECTION;  Surgeon: Kerin Salen, MD;  Location: [facility];  Service: Gastroenterology;  Laterality: N/A;   BREAST LUMPECTOMY  1989   lt-negative   CARDIOVASCULAR STRESS TEST  2000   CATARACT EXTRACTION W/PHACO Left 12/07/2019   Procedure: CATARACT EXTRACTION PHACO AND INTRAOCULAR LENS PLACEMENT (IOC);  Surgeon: [name], MD;  Location: [facility];  Service: Ophthalmology;  Laterality: Left;  CDE: 5.55   CHOLECYSTECTOMY  1985   COLONOSCOPY     ESOPHAGEAL MANOMETRY N/A 08/24/2021   Procedure: ESOPHAGEAL MANOMETRY (EM);  Surgeon: Kerin Salen, MD;  Location: [facility];  Service: Gastroenterology;  Laterality: N/A;   ESOPHAGOGASTRODUODENOSCOPY N/A 11/03/2021   Procedure: ESOPHAGOGASTRODUODENOSCOPY (EGD);  Surgeon: Kerin Salen, MD;  Location: [facility];  Service: Gastroenterology;  Laterality: N/A;   HYSTERECTOMY ABDOMINAL WITH SALPINGECTOMY Bilateral 2000   KIDNEY STONE SURGERY Right 2014    with stent placement in OR   KNEE ARTHROSCOPY Right 08/22/2013   Procedure: ARTHROSCOPY RIGHT KNEE FOR INFECTION LAVAGE AND DRAINAGE;  Surgeon: Thera Flake., MD;  Location: [facility];  Service: Orthopedics;  Laterality: Right;   KNEE ARTHROSCOPY WITH PATELLA RECONSTRUCTION Right 08/06/2013   Procedure: RIGHT KNEE ARTHROSCOPY WITH MENISCECTOMY MEDIAL, ARTHROSCOPY KNEE WITH DEBRIDEMENT/SHAVING (CHONDROPLASTY) ;  Surgeon: Thera Flake., MD;  Location: [facility];  Service: Orthopedics;  Laterality: Right;   LUMBAR LAMINECTOMY/DECOMPRESSION MICRODISCECTOMY Right 09/11/2019   Procedure: Right Lumbar Three-Four Microdiscectomy;  Surgeon: Maeola Harman, MD;  Location: [facility];  Service: Neurosurgery;  Laterality: Right;  Right Lumbar Three-Four Microdiscectomy   NASAL SINUS SURGERY     x4   OOPHORECTOMY     SHOULDER SURGERY  2011   right shoulder   THYROID SURGERY  1994   TONSILLECTOMY Bilateral 1974   Social History   Social History Narrative   Caffiene 4-5 12 oz cans soda.  Working retired - special ed.   Live with home no kids   Immunization History  Administered Date(s) Administered   Tdap 03/04/2021   Zoster Recombinat (Shingrix) 10/15/2021     Objective: Vital Signs: BP 125/80 (BP Location: Left Arm, Patient Position: Sitting, Cuff Size: Normal)   Pulse 96   Resp 16   Ht 5\' 4"  (1.626 m)   Wt 169 lb 9.6 oz (76.9 kg)   BMI 29.11 kg/m    Physical Exam Vitals and nursing note reviewed.  Constitutional:      Appearance: She is well-developed.  HENT:     Head: Normocephalic and atraumatic.  Eyes:     Conjunctiva/sclera: Conjunctivae normal.  Cardiovascular:     Rate and Rhythm: Normal rate and regular rhythm.     Heart sounds: Normal heart sounds.  Pulmonary:     Effort: Pulmonary effort is normal.     Breath sounds: Normal breath sounds.  Abdominal:     General: Bowel sounds are normal.     Palpations: Abdomen is soft.   Musculoskeletal:     Cervical back: Normal range of motion.  Skin:    General: Skin is warm and dry.     Capillary Refill: Capillary refill takes less than 2 seconds.  Neurological:     Mental Status: She is alert and oriented to person, place, and time.  Psychiatric:        Behavior: Behavior normal.      Musculoskeletal Exam: C-spine has limited range of motion especially without rotation to the left.  Painful limited range of motion of the left shoulder especially with internal rotation.  Right shoulder has full range of motion with no discomfort.  Elbow joints, wrist joints, MCPs, PIPs, DIPs have good range of motion with no synovitis.  Complete fist formation bilaterally.  PIP and DIP thickening consistent with osteoarthritis of both hands.  Hip joints have good range of motion with no groin pain.  Knee joints have good range of motion with no warmth or effusion.  Ankle joints have good range of motion with no tenderness or synovitis.  CDAI Exam: CDAI Score: 1.8  Patient Global: 4 mm; Provider Global: 4 mm Swollen: 0 ; Tender: 1  Joint Exam 06/04/2022      Right  Left  Glenohumeral      Tender     Investigation: No additional findings.  Imaging: US THYROID  Result Date: 05/07/2022 CLINICAL DATA:  Goiter. History of multinodular thyroid gland. Patient is previously undergone biopsy of the left inferior thyroid nodule in November of 2012 and the left mid nodule in November of 2012 and again in January of 2014 EXAM: THYROID ULTRASOUND TECHNIQUE: Ultrasound examination of the thyroid gland and adjacent soft tissues was performed. COMPARISON:  Most recent prior thyroid ultrasound 07/01/2021 FINDINGS: Parenchymal Echotexture: Mildly heterogenous Isthmus: 0.3 cm Right lobe: 5.1 x 1.2 x 1.5 cm Left lobe: 4.3 x 1.5 x 1.2 cm _________________________________________________________ Estimated total number of nodules >/= 1 cm: 2 Number of spongiform nodules >/=  2 cm not described below  (TR1): 0 Number of mixed cystic and solid nodules >/= 1.5 cm not described below (TR2): 0 _________________________________________________________ Nodule # 4: Previously biopsied nodule in the left mid gland is unchanged at 1.2 x 1.2 x 1.1 cm. Nodule # 6: Previously biopsied nodule in the left inferior gland is unchanged at 1.8 x 1.6 x 1.2 cm. Additional small subcentimeter thyroid nodules again noted scattered throughout the right gland. No interval change. No  new nodules or suspicious features. IMPRESSION: 1. Previously biopsied nodules in the left mid and left lower gland remain unchanged. Greater than 10 years of stability is consistent with benignity. 2. No new nodules or suspicious features. The above is in keeping with the ACR TI-RADS recommendations - J Am Coll Radiol 2017;14:587-595. Electronically Signed   By: Malachy Moan M.D.   On: 05/07/2022 06:37    Recent Labs: Lab Results  Component Value Date   WBC 11.4 (H) 03/09/2022   HGB 14.1 03/09/2022   PLT 429 (H) 03/09/2022   NA 140 03/09/2022   K 4.5 03/09/2022   CL 104 03/09/2022   CO2 27 03/09/2022   GLUCOSE 101 (H) 03/09/2022   BUN 18 03/09/2022   CREATININE 0.87 03/09/2022   BILITOT 0.2 03/09/2022   ALKPHOS 82 10/15/2021   AST 21 03/09/2022   ALT 23 03/09/2022   PROT 7.2 03/09/2022   ALBUMIN 4.5 10/15/2021   CALCIUM 10.5 (H) 03/09/2022   GFRAA 98 03/24/2020   QFTBGOLDPLUS NEGATIVE 09/29/2021    Speciality Comments: PLQ Eye Exam: 10/07/2021 WNL @ [facility] [place]  f/u 01/2022  Procedures:  Large Joint Inj: L glenohumeral on 06/04/2022 9:13 AM Indications: pain Details: 27 G 1.5 in needle, posterior approach  Arthrogram: No  Medications: 1 mL lidocaine 1 %; 40 mg triamcinolone acetonide 40 MG/ML Aspirate: 0 mL Outcome: tolerated well, no immediate complications Procedure, treatment alternatives, risks and benefits explained, specific risks discussed. Consent was given by the patient. Immediately prior to  procedure a time out was called to verify the correct patient, procedure, equipment, support staff and site/side marked as required. Patient was prepped and draped in the usual sterile fashion.     Allergies: Sulfa antibiotics, Codeine, Erythromycin, Metronidazole, and Morphine and related   Assessment / Plan:     Visit Diagnoses: Rheumatoid arthritis of multiple sites with negative rheumatoid factor: Patient presents today experiencing a flare in the left shoulder which started about 2 weeks ago.  She has been under tremendous amount of stress grieving the loss of her husband.  She has been having intermittent pain, stiffness, and swelling in her hands.  She remains on methotrexate 8 tablets by mouth once weekly along with folic acid 2 mg daily and Plaquenil 200 mg 1 tablet by mouth twice daily Monday through Friday.  She has been tolerating combination therapy without any side effects.  Overall she has noticed a 60 to 70% improvement since adding on methotrexate as combination therapy.  She has not had any recent or recurrent infections.  Discussed switching from oral to injectable methotrexate to improve efficacy but she would like to hold off at this time.  She will remain on the current dose of methotrexate and Plaquenil as prescribed.  She was vies notify us if she continues to have recurrent flares.  She will follow-up in the office in 3 months or sooner if needed.  High risk medication use -Methotrexate 8 tablets by mouth once weekly, folic acid 2 mg daily, and Plaquenil 200 mg 1 tablet by mouth twice daily Monday through Friday.  PLQ Eye Exam: 10/07/2021 WNL @ [facility] .  CBC and CMP updated on 03/09/22.  Orders for CBC and CMP released today.  Her next lab work will be due in July and every 3 months to monitor for drug toxicity. No recent or recurrent infections.  Discussed the importance of holding methotrexate if she develops signs or symptoms of an infection and to resume  once  the infection is completely cleared.  Plan: COMPLETE METABOLIC PANEL WITH GFR, CBC with Differential/Platelet, B12 and Folate Panel  Primary osteoarthritis of both hands: She has PIP and DIP thickening consistent with osteoarthritis of both hands.  She experiences intermittent pain and stiffness in both hands due to underlying osteoarthritis.  No active inflammation was noted today.  Discussed the importance of joint protection and muscle strengthening.  Primary osteoarthritis of right knee: She has good range of motion of the right knee joint on examination today.  No warmth or effusion noted.  Primary osteoarthritis of both feet: She has good range of motion of both ankle joints with no tenderness or synovitis.  Her ankle and feet pain have improved since adding on methotrexate as combination therapy.  Trochanteric bursitis of right hip: Currently asymptomatic.  Other fatigue: Stable.  DDD (degenerative disc disease), cervical: C-spine has limited range of motion with lateral rotation.  No symptoms of radiculopathy.  DDD (degenerative disc disease), lumbar: No midline spinal tenderness.  No symptoms of radiculopathy.  Chronic SI joint pain: She has no SI joint discomfort at this time.  Fibromyalgia: She experiences intermittent myalgias and muscle tenderness due to fibromyalgia.  Overall her symptoms have been manageable.  Discussed the importance of regular exercise and good sleep hygiene.  A refill of baclofen 10 mg 1 tablet in the morning and 1 tablet in the afternoon as well as tizanidine 4 mg at bedtime were sent to the pharmacy today to take during flares.  Chronic left shoulder pain -Patient presents today with left shoulder joint pain.  She has not had any recent injury or fall.  Her symptom started about 2 weeks ago.  She has been taking Tylenol as needed for symptomatic relief.  She has painful limited range of motion especially with internal rotation today.  She is been having  difficulty sleeping at night specially when lying on her left side.  X-rays of the left shoulder were obtained today for further evaluation.  After informed consent the left shoulder was injected with cortisone.  She tolerated procedure well.  Procedure note was completed above.  Aftercare was discussed.  A handout of exercises was provided to the patient to start performing once her symptoms have improved.  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  Plan to check vitamin B12 and folate level today.  She is been taking folic acid 2 mg daily as advised.  Plan: B12 and Folate Panel  Other medical conditions are listed as follows:  Family history of rheumatoid arthritis-Sister  Family history of psoriatic arthritis-mother and sister  History of tics  History of diabetes mellitus  History of sleep apnea  History of thyroid nodule  History of gastroesophageal reflux (GERD)  History of renal calculi  History of depression  History of cellulitis  History of positive PPD  History of migraine  History of PCOS    Orders: Orders Placed This Encounter  Procedures   Large Joint Inj   XR Shoulder Left   COMPLETE METABOLIC PANEL WITH GFR   CBC with Differential/Platelet   B12 and Folate Panel   No orders of the defined types were placed in this encounter.    Follow-Up Instructions: Return in about 3 months (around 09/03/2022) for Rheumatoid arthritis, Osteoarthritis.   Gearldine Bienenstock, PA-C  Note - This record has been created using Dragon software.  Chart creation errors  have been sought, but may not always  have been located. Such creation errors do not reflect on  the standard of medical care.

## 2022-06-03 NOTE — Telephone Encounter (Signed)
I received sleep study records.  The patient had a baseline sleep study in [place], [place], on 02/14/2012.  Her AHI was 23.4/h, O2 nadir 81%, snoring was reported as severe.  The patient had a subsequent CPAP titration study on 03/27/2012 at which time she had an optimal response on CPAP of 11 cm.  I have no other records regarding CPAP compliance.  Please advise patient that my recommendation is to proceed with AutoPap therapy as discussed.  Surgical treatment with inspire is not considered first-line therapy.

## 2022-06-04 ENCOUNTER — Ambulatory Visit (INDEPENDENT_AMBULATORY_CARE_PROVIDER_SITE_OTHER): Payer: Medicare PPO

## 2022-06-04 ENCOUNTER — Ambulatory Visit: Payer: Medicare PPO | Attending: Physician Assistant | Admitting: Physician Assistant

## 2022-06-04 ENCOUNTER — Other Ambulatory Visit: Payer: Self-pay

## 2022-06-04 ENCOUNTER — Encounter: Payer: Self-pay | Admitting: Physician Assistant

## 2022-06-04 VITALS — BP 125/80 | HR 96 | Resp 16 | Ht 64.0 in | Wt 169.6 lb

## 2022-06-04 DIAGNOSIS — Z79899 Other long term (current) drug therapy: Secondary | ICD-10-CM

## 2022-06-04 DIAGNOSIS — M19071 Primary osteoarthritis, right ankle and foot: Secondary | ICD-10-CM

## 2022-06-04 DIAGNOSIS — M0609 Rheumatoid arthritis without rheumatoid factor, multiple sites: Secondary | ICD-10-CM | POA: Diagnosis not present

## 2022-06-04 DIAGNOSIS — G8929 Other chronic pain: Secondary | ICD-10-CM

## 2022-06-04 DIAGNOSIS — M51369 Other intervertebral disc degeneration, lumbar region without mention of lumbar back pain or lower extremity pain: Secondary | ICD-10-CM

## 2022-06-04 DIAGNOSIS — M19042 Primary osteoarthritis, left hand: Secondary | ICD-10-CM

## 2022-06-04 DIAGNOSIS — E538 Deficiency of other specified B group vitamins: Secondary | ICD-10-CM | POA: Diagnosis not present

## 2022-06-04 DIAGNOSIS — M25512 Pain in left shoulder: Secondary | ICD-10-CM

## 2022-06-04 DIAGNOSIS — Z8669 Personal history of other diseases of the nervous system and sense organs: Secondary | ICD-10-CM

## 2022-06-04 DIAGNOSIS — M533 Sacrococcygeal disorders, not elsewhere classified: Secondary | ICD-10-CM

## 2022-06-04 DIAGNOSIS — Z8261 Family history of arthritis: Secondary | ICD-10-CM

## 2022-06-04 DIAGNOSIS — M19041 Primary osteoarthritis, right hand: Secondary | ICD-10-CM

## 2022-06-04 DIAGNOSIS — Z9289 Personal history of other medical treatment: Secondary | ICD-10-CM

## 2022-06-04 DIAGNOSIS — M7061 Trochanteric bursitis, right hip: Secondary | ICD-10-CM

## 2022-06-04 DIAGNOSIS — M1711 Unilateral primary osteoarthritis, right knee: Secondary | ICD-10-CM

## 2022-06-04 DIAGNOSIS — M5030 Other cervical disc degeneration, unspecified cervical region: Secondary | ICD-10-CM

## 2022-06-04 DIAGNOSIS — M5136 Other intervertebral disc degeneration, lumbar region: Secondary | ICD-10-CM | POA: Diagnosis not present

## 2022-06-04 DIAGNOSIS — Z8639 Personal history of other endocrine, nutritional and metabolic disease: Secondary | ICD-10-CM

## 2022-06-04 DIAGNOSIS — M797 Fibromyalgia: Secondary | ICD-10-CM

## 2022-06-04 DIAGNOSIS — Z8719 Personal history of other diseases of the digestive system: Secondary | ICD-10-CM

## 2022-06-04 DIAGNOSIS — Z840 Family history of diseases of the skin and subcutaneous tissue: Secondary | ICD-10-CM

## 2022-06-04 DIAGNOSIS — Z87442 Personal history of urinary calculi: Secondary | ICD-10-CM

## 2022-06-04 DIAGNOSIS — Z8659 Personal history of other mental and behavioral disorders: Secondary | ICD-10-CM

## 2022-06-04 DIAGNOSIS — Z872 Personal history of diseases of the skin and subcutaneous tissue: Secondary | ICD-10-CM

## 2022-06-04 DIAGNOSIS — M19072 Primary osteoarthritis, left ankle and foot: Secondary | ICD-10-CM

## 2022-06-04 DIAGNOSIS — R5383 Other fatigue: Secondary | ICD-10-CM | POA: Diagnosis not present

## 2022-06-04 DIAGNOSIS — Z8742 Personal history of other diseases of the female genital tract: Secondary | ICD-10-CM

## 2022-06-04 MED ORDER — LIDOCAINE HCL 1 % IJ SOLN
1.0000 mL | INTRAMUSCULAR | Status: AC | PRN
  Administered 2022-06-04: 1 mL

## 2022-06-04 MED ORDER — FOLIC ACID 1 MG PO TABS: 2.0000 mg | ORAL_TABLET | Freq: Every day | ORAL | 3 refills | Status: DC

## 2022-06-04 MED ORDER — METHOTREXATE SODIUM 2.5 MG PO TABS: ORAL_TABLET | ORAL | 0 refills | Status: DC

## 2022-06-04 MED ORDER — TIZANIDINE HCL 4 MG PO TABS: 4.0000 mg | ORAL_TABLET | Freq: Every evening | ORAL | 0 refills | Status: DC | PRN

## 2022-06-04 MED ORDER — HYDROXYCHLOROQUINE SULFATE 200 MG PO TABS: ORAL_TABLET | ORAL | 0 refills | Status: DC

## 2022-06-04 MED ORDER — TRIAMCINOLONE ACETONIDE 40 MG/ML IJ SUSP
40.0000 mg | INTRAMUSCULAR | Status: AC | PRN
  Administered 2022-06-04: 40 mg via INTRA_ARTICULAR

## 2022-06-04 MED ORDER — BACLOFEN 10 MG PO TABS: ORAL_TABLET | ORAL | 0 refills | Status: DC

## 2022-06-04 NOTE — Progress Notes (Unsigned)
Please review pended and send refills for MTX, PLQ, folic acid, tizanidine and baclofen. Thanks!

## 2022-06-04 NOTE — Patient Instructions (Signed)

## 2022-06-05 LAB — COMPLETE METABOLIC PANEL WITH GFR
AG Ratio: 1.9 (calc) (ref 1.0–2.5)
ALT: 20 U/L (ref 6–29)
AST: 17 U/L (ref 10–35)
Albumin: 4.1 g/dL (ref 3.6–5.1)
Alkaline phosphatase (APISO): 65 U/L (ref 37–153)
BUN: 18 mg/dL (ref 7–25)
CO2: 24 mmol/L (ref 20–32)
Calcium: 9.9 mg/dL (ref 8.6–10.4)
Chloride: 106 mmol/L (ref 98–110)
Creat: 0.71 mg/dL (ref 0.50–1.05)
Globulin: 2.2 g/dL (calc) (ref 1.9–3.7)
Glucose, Bld: 117 mg/dL — ABNORMAL HIGH (ref 65–99)
Potassium: 4.5 mmol/L (ref 3.5–5.3)
Sodium: 139 mmol/L (ref 135–146)
Total Bilirubin: 0.2 mg/dL (ref 0.2–1.2)
Total Protein: 6.3 g/dL (ref 6.1–8.1)
eGFR: 94 mL/min/{1.73_m2} (ref >=60)

## 2022-06-05 LAB — CBC WITH DIFFERENTIAL/PLATELET
Absolute Monocytes: 969 cells/uL — ABNORMAL HIGH (ref 200–950)
Basophils Absolute: 125 cells/uL (ref 0–200)
Basophils Relative: 1.1 %
Eosinophils Absolute: 433 cells/uL (ref 15–500)
Eosinophils Relative: 3.8 %
HCT: 38.3 % (ref 35.0–45.0)
Hemoglobin: 13.1 g/dL (ref 11.7–15.5)
Lymphs Abs: 2645 cells/uL (ref 850–3900)
MCH: 31.9 pg (ref 27.0–33.0)
MCHC: 34.2 g/dL (ref 32.0–36.0)
MCV: 93.2 fL (ref 80.0–100.0)
MPV: 10.5 fL (ref 7.5–12.5)
Monocytes Relative: 8.5 %
Neutro Abs: 7228 cells/uL (ref 1500–7800)
Neutrophils Relative %: 63.4 %
Platelets: 353 10*3/uL (ref 140–400)
RBC: 4.11 10*6/uL (ref 3.80–5.10)
RDW: 13.8 % (ref 11.0–15.0)
Total Lymphocyte: 23.2 %
WBC: 11.4 10*3/uL — ABNORMAL HIGH (ref 3.8–10.8)

## 2022-06-05 LAB — B12 AND FOLATE PANEL
Folate: >24 ng/mL
Vitamin B-12: 1411 pg/mL — ABNORMAL HIGH (ref 200–1100)

## 2022-06-07 ENCOUNTER — Other Ambulatory Visit (HOSPITAL_COMMUNITY): Payer: Self-pay | Admitting: Family Medicine

## 2022-06-07 DIAGNOSIS — Z1231 Encounter for screening mammogram for malignant neoplasm of breast: Secondary | ICD-10-CM

## 2022-06-07 NOTE — Telephone Encounter (Signed)
At this point, she is not a candidate for Inspire from my end of things. If she would like to consider an oral appliance, I would be happy to make a referral to dentistry.

## 2022-06-07 NOTE — Telephone Encounter (Signed)
I spoke with the patient and informed her of the results. I advised of the recommendation to proceed with AutoPap therapy as discussed. The patient disagreed with the plan to began AutoPap. She stated she was dissatisfied with her previous experience on CPAP. I acknowledged decision and agreed to inform provider.

## 2022-06-07 NOTE — Telephone Encounter (Signed)
The patient had also sent [place] a mychart message last week. I have messaged the patient back with the message below from Dr Frances Furbish.

## 2022-06-07 NOTE — Telephone Encounter (Signed)
Huston Foley, MD1 hour ago (10:02 AM)    At this point, she is not a candidate for Inspire from my end of things. If she would like to consider an oral appliance, I would be happy to make a referral to dentistry.

## 2022-06-07 NOTE — Progress Notes (Signed)
Glucose is 117. Rest of CMP WNL  WBC count is borderline elevated-stable. We will continue to monitor.  Vitamin B12 is elevated-please clarify if she is taking a supplement/injections? Folate WNL

## 2022-06-08 ENCOUNTER — Telehealth: Payer: Self-pay | Admitting: Neurology

## 2022-06-08 NOTE — Telephone Encounter (Signed)
Dentistry referral faxed to [facility] (fax# [phone],  phone# [phone])

## 2022-06-11 ENCOUNTER — Encounter (HOSPITAL_COMMUNITY): Payer: Self-pay

## 2022-06-11 ENCOUNTER — Inpatient Hospital Stay (HOSPITAL_COMMUNITY): Admission: RE | Admit: 2022-06-11 | Payer: Medicare PPO | Source: Ambulatory Visit

## 2022-06-11 DIAGNOSIS — Z1231 Encounter for screening mammogram for malignant neoplasm of breast: Secondary | ICD-10-CM

## 2022-07-05 ENCOUNTER — Ambulatory Visit (HOSPITAL_COMMUNITY): Payer: Medicare PPO

## 2022-07-06 NOTE — Telephone Encounter (Signed)
Received PAP form for Nurtec from patient.  Completed and to Dr. Lucia Gaskins for signature.   Rx says for 10 tabs/ 30days.  Blister pack come in 8 or 16 tabs.   Pt is using for acute migraines.  (Need to mark on form the #).

## 2022-07-07 ENCOUNTER — Ambulatory Visit (HOSPITAL_COMMUNITY)
Admission: RE | Admit: 2022-07-07 | Discharge: 2022-07-07 | Disposition: A | Discharge status: Home / Self Care | Payer: Medicare PPO | Source: Ambulatory Visit | Attending: Family Medicine | Admitting: Family Medicine

## 2022-07-07 DIAGNOSIS — Z1231 Encounter for screening mammogram for malignant neoplasm of breast: Secondary | ICD-10-CM | POA: Diagnosis not present

## 2022-07-07 NOTE — Telephone Encounter (Signed)
Fax confirmation received nurtec odt PAP [phone].

## 2022-07-07 NOTE — Telephone Encounter (Signed)
Spoke with [facility]. She states she faxed the order to them this morning.

## 2022-07-07 NOTE — Telephone Encounter (Signed)
I signed everything in my box yesterday in the pod (not in my office correct?)

## 2022-07-08 ENCOUNTER — Telehealth: Payer: Self-pay | Admitting: Neurology

## 2022-07-08 NOTE — Telephone Encounter (Signed)
Christina Lewis @ [facility] states they need clarity on the quantity for pt's Nurtec ,that can be corrected on the form and faxed back to [phone] or RN can call [phone] and give to a pharmacist.

## 2022-07-08 NOTE — Telephone Encounter (Signed)
I called and spoke to rep.  They did receive the fax this morning, will upload new updated information (# of tablets)  which is 8 blister pack for acute migraines.  Refills x 11. Appreciated call back.

## 2022-07-12 ENCOUNTER — Telehealth (HOSPITAL_COMMUNITY): Payer: Medicare PPO | Admitting: Psychiatry

## 2022-07-13 ENCOUNTER — Other Ambulatory Visit (HOSPITAL_COMMUNITY): Payer: Self-pay | Admitting: Family Medicine

## 2022-07-13 ENCOUNTER — Telehealth (HOSPITAL_BASED_OUTPATIENT_CLINIC_OR_DEPARTMENT_OTHER): Payer: Medicare PPO | Admitting: Psychiatry

## 2022-07-13 ENCOUNTER — Encounter (HOSPITAL_COMMUNITY): Payer: Self-pay | Admitting: Psychiatry

## 2022-07-13 VITALS — Wt 165.0 lb

## 2022-07-13 DIAGNOSIS — F331 Major depressive disorder, recurrent, moderate: Secondary | ICD-10-CM | POA: Diagnosis not present

## 2022-07-13 DIAGNOSIS — F419 Anxiety disorder, unspecified: Secondary | ICD-10-CM

## 2022-07-13 DIAGNOSIS — F4321 Adjustment disorder with depressed mood: Secondary | ICD-10-CM

## 2022-07-13 DIAGNOSIS — Z1231 Encounter for screening mammogram for malignant neoplasm of breast: Secondary | ICD-10-CM

## 2022-07-13 MED ORDER — SERTRALINE HCL 50 MG PO TABS: 50.0000 mg | ORAL_TABLET | Freq: Every day | ORAL | 0 refills | Status: DC

## 2022-07-13 NOTE — Progress Notes (Signed)
[facility] MD Virtual Progress Note   Patient Location: Home Provider Location: Home Office  I connect with patient by video and verified that I am speaking with correct person by using two identifiers. I discussed the limitations of evaluation and management by telemedicine and the availability of in person appointments. I also discussed with the patient that there may be a patient responsible charge related to this service. The patient expressed understanding and agreed to proceed.  Christina Lewis [id number] 67 y.o.  07/13/2022 4:23 PM  History of Present Illness:  Patient is evaluated by video session.  She has been not doing very well lately.  Last month her husband died due to lung cancer.  Patient told it was very fast and she took him to the hospital 1 day before her death because he was not doing very well and required intubation, went on ventilation and next day he died.  Patient told she had a hard time going through grief.  She admitted crying spells, feeling of hopelessness, sad, dysphoria, fatigue and lack of motivation to do things.  She now moved in with her sister because she does not want to stay by herself.  Her sister is very supportive.  She is not taking any psychotropic medication.  She had tried Wellbutrin 300 which she believes made her irritable and angry.  We have recommended to cut down to 150 but she did not started the medicine yet.  In the past she had tried Prozac, Lexapro, Cymbalta, doxepin and she feels none of these medicine worked for her.  We also recommended Viibryd but her insurance does not approved.  She denies any suicidal thoughts or homicidal thoughts.  She reported lack of appetite, energy level and lost few pounds.  She reported crying spells and does not go outside.  She reported racing thoughts, poor sleep and thinking about her husband.  They have been married for 42 years.  She has chronic fatigue, long cold symptoms, fogginess but lately  she noticed symptoms are worsening.  She denies any mania or psychosis.  She denies drinking or using any illegal substances.  Past Psychiatric History: History of depression in 1990 and given Prozac and Lexapro by PCP.  Saw Dr Evelene Croon in 2012 and prescribed Cymbalta.  Dose increased to 120 mg and last year doxepin was added.  No history of suicidal attempt, inpatient treatment, psychosis, mania, abuse, legal issues, seizures, TBI.    Outpatient Encounter Medications as of 07/13/2022  Medication Sig   acetaminophen (TYLENOL) 650 MG CR tablet Take 1,300 mg by mouth every 8 (eight) hours as needed for pain.   aspirin EC 81 MG tablet Take 81 mg by mouth daily. Swallow whole.   baclofen (LIORESAL) 10 MG tablet TAKE 1 TABLET EVERY MORNING AND 1 TABLET AT NOON.   buPROPion (WELLBUTRIN XL) 150 MG 24 hr tablet Take 1 tablet (150 mg total) by mouth every morning. (Patient not taking: Reported on 06/04/2022)   cholecalciferol (VITAMIN D) 25 MCG (1000 UNIT) tablet Take 1,000 Units by mouth daily.   doxepin (SINEQUAN) 50 MG capsule Take 1 capsule (50 mg total) by mouth at bedtime.   DULoxetine (CYMBALTA) 30 MG capsule Take 30 mg by mouth daily. (Patient not taking: Reported on 05/10/2022)   Eptinezumab-jjmr (VYEPTI) 100 MG/ML injection Inject into the vein.   ferrous sulfate 325 (65 FE) MG tablet Take 325 mg by mouth daily.   fluticasone (FLONASE ALLERGY RELIEF) 50 MCG/ACT nasal spray Place 1 spray into  both nostrils in the morning and at bedtime.   folic acid (FOLVITE) 1 MG tablet Take 2 tablets (2 mg total) by mouth daily.   gabapentin (NEURONTIN) 300 MG capsule Take 300-600 mg by mouth See admin instructions. 600 mg in the morning, 300 mg at NOON, and 600 mg at bedtime   Reports taking only at night   hydroxychloroquine (PLAQUENIL) 200 MG tablet TAKE 1 TABLET TWICE DAILY MONDAY THRU FRIDAY FOR RHEUMATOID ARTHRTITIS.   MAGNESIUM PO Take 500 mg by mouth at bedtime.   meclizine (ANTIVERT) 25 MG tablet Take 1  tablet (25 mg total) by mouth 3 (three) times daily as needed for dizziness.   methotrexate (RHEUMATREX) 2.5 MG tablet TAKE 8 TABLETS WEEKLY -- PROTECT FROM LIGHT   Multiple Vitamin (MULTIVITAMIN WITH MINERALS) TABS tablet Take 1 tablet by mouth daily.   omeprazole (PRILOSEC) 40 MG capsule Take 40 mg by mouth daily.   ondansetron (ZOFRAN-ODT) 8 MG disintegrating tablet Take 8 mg by mouth every 8 (eight) hours as needed for nausea.    Rimegepant Sulfate (NURTEC) 75 MG TBDP Take 1 tablet (75 mg total) by mouth daily as needed. For migraines. Take as close to onset of migraine as possible. One daily maximum.   tiZANidine (ZANAFLEX) 4 MG tablet Take 1 tablet (4 mg total) by mouth at bedtime as needed.   TURMERIC-GINGER PO Take 1 tablet by mouth daily.   No facility-administered encounter medications on file as of 07/13/2022.    Recent Results (from the past 2160 hour(s))  COMPLETE METABOLIC PANEL WITH GFR     Status: Abnormal   Collection Time: 06/04/22  9:36 AM  Result Value Ref Range   Glucose, Bld 117 (H) 65 - 99 mg/dL    Comment: .            Fasting reference interval . For someone without known diabetes, a glucose value between 100 and 125 mg/dL is consistent with prediabetes and should be confirmed with a follow-up test. .    BUN 18 7 - 25 mg/dL   Creat 1.61 0.96 - 0.45 mg/dL   eGFR 94 > OR = 60 WU/JWJ/1.91Y7   BUN/Creatinine Ratio SEE NOTE: 6 - 22 (calc)    Comment:    Not Reported: BUN and Creatinine are within    reference range. .    Sodium 139 135 - 146 mmol/L   Potassium 4.5 3.5 - 5.3 mmol/L   Chloride 106 98 - 110 mmol/L   CO2 24 20 - 32 mmol/L   Calcium 9.9 8.6 - 10.4 mg/dL   Total Protein 6.3 6.1 - 8.1 g/dL   Albumin 4.1 3.6 - 5.1 g/dL   Globulin 2.2 1.9 - 3.7 g/dL (calc)   AG Ratio 1.9 1.0 - 2.5 (calc)   Total Bilirubin 0.2 0.2 - 1.2 mg/dL   Alkaline phosphatase (APISO) 65 37 - 153 U/L   AST 17 10 - 35 U/L   ALT 20 6 - 29 U/L  CBC with Differential/Platelet      Status: Abnormal   Collection Time: 06/04/22  9:36 AM  Result Value Ref Range   WBC 11.4 (H) 3.8 - 10.8 Thousand/uL   RBC 4.11 3.80 - 5.10 Million/uL   Hemoglobin 13.1 11.7 - 15.5 g/dL   HCT 82.9 56.2 - 13.0 %   MCV 93.2 80.0 - 100.0 fL   MCH 31.9 27.0 - 33.0 pg   MCHC 34.2 32.0 - 36.0 g/dL   RDW 86.5 78.4 - 69.6 %  Platelets 353 140 - 400 Thousand/uL   MPV 10.5 7.5 - 12.5 fL   Neutro Abs 7,228 1,500 - 7,800 cells/uL   Lymphs Abs 2,645 850 - 3,900 cells/uL   Absolute Monocytes 969 (H) 200 - 950 cells/uL   Eosinophils Absolute 433 15 - 500 cells/uL   Basophils Absolute 125 0 - 200 cells/uL   Neutrophils Relative % 63.4 %   Total Lymphocyte 23.2 %   Monocytes Relative 8.5 %   Eosinophils Relative 3.8 %   Basophils Relative 1.1 %  B12 and Folate Panel     Status: Abnormal   Collection Time: 06/04/22  9:36 AM  Result Value Ref Range   Vitamin B-12 1,411 (H) 200 - 1,100 pg/mL   Folate >24.0 ng/mL    Comment:                            Reference Range                            Low:           <3.4                            Borderline:    3.4-5.4                            Normal:        >5.4 .      Psychiatric Specialty Exam: Physical Exam  Review of Systems  Constitutional:  Positive for activity change, appetite change and fatigue.  Psychiatric/Behavioral:  Positive for dysphoric mood and sleep disturbance. The patient is nervous/anxious.     Weight 165 lb (74.8 kg).There is no height or weight on file to calculate BMI.  General Appearance: Casual  Eye Contact:  Fair  Speech:  Slow  Volume:  Decreased  Mood:  Depressed, Dysphoric, and Hopeless  Affect:  Congruent  Thought Process:  Descriptions of Associations: Intact  Orientation:  Full (Time, Place, and Person)  Thought Content:  Rumination  Suicidal Thoughts:  No  Homicidal Thoughts:  No  Memory:  Immediate;   Good Recent;   Good Remote;   Fair  Judgement:  Intact  Insight:  Present  Psychomotor  Activity:  Decreased  Concentration:  Concentration: Fair and Attention Span: Fair  Recall:  Fair  Fund of Knowledge:  Good  Language:  Good  Akathisia:  No  Handed:  Right  AIMS (if indicated):     Assets:  Communication Skills Desire for Improvement Social Support Transportation  ADL's:  Intact  Cognition:  WNL  Sleep:  poor     Assessment/Plan: MDD (major depressive disorder), recurrent episode, moderate (HCC) - Plan: sertraline (ZOLOFT) 50 MG tablet  Anxiety - Plan: sertraline (ZOLOFT) 50 MG tablet  Grief - Plan: sertraline (ZOLOFT) 50 MG tablet  Discussed recent loss of husband which was shocking because he was given the diagnosis of lung cancer and could not handle chemotherapy, went to the hospital, intubated, went on ventilator and died next day.  She admitted going through grief.  I recommend should consider grief counseling through hospice.  Patient reported cannot afford regular therapy due to higher co-pay but willing to try hospice.  I also provided information about [facility] therapy which is available.  However patient like to  try hospice.  Currently she is not taking any antidepressant.  I review past history.  She had tried Prozac, Lexapro, Cymbalta, Wellbutrin, doxepin.  She had never tried Zoloft.  I recommend considering to try Zoloft and she agreed with the plan.  We will start 50 mg daily.  I also offered IOP but patient at this time not interested but willing to consider in the future if needed.  Discussed medication side effect specially in the beginning can cause headaches, nausea and GI side effects.  Discussed safety concern that anytime having active suicidal thoughts or homicidal thought then she need to call 911 or go to local emergency room.  We will follow-up in 3 weeks.   Follow Up Instructions:     I discussed the assessment and treatment plan with the patient. The patient was provided an opportunity to ask questions and all were answered.  The patient agreed with the plan and demonstrated an understanding of the instructions.   The patient was advised to call back or seek an in-person evaluation if the symptoms worsen or if the condition fails to improve as anticipated.    Collaboration of Care: Other provider involved in patient's care AEB notes are available in epic to review.  Patient/Guardian was advised Release of Information must be obtained prior to any record release in order to collaborate their care with an outside provider. Patient/Guardian was advised if they have not already done so to contact the registration department to sign all necessary forms in order for [place] to release information regarding their care.   Consent: Patient/Guardian gives verbal consent for treatment and assignment of benefits for services provided during this visit. Patient/Guardian expressed understanding and agreed to proceed.     I provided 25 minutes of non face to face time during this encounter.  Note: This document was prepared by [employer] voice dictation technology and any errors that results from this process are unintentional.    Cleotis Nipper, MD 07/13/2022

## 2022-07-26 DIAGNOSIS — E049 Nontoxic goiter, unspecified: Secondary | ICD-10-CM | POA: Diagnosis not present

## 2022-07-26 DIAGNOSIS — E7800 Pure hypercholesterolemia, unspecified: Secondary | ICD-10-CM | POA: Diagnosis not present

## 2022-07-26 DIAGNOSIS — E1165 Type 2 diabetes mellitus with hyperglycemia: Secondary | ICD-10-CM | POA: Diagnosis not present

## 2022-07-26 DIAGNOSIS — E559 Vitamin D deficiency, unspecified: Secondary | ICD-10-CM | POA: Diagnosis not present

## 2022-07-28 DIAGNOSIS — G43711 Chronic migraine without aura, intractable, with status migrainosus: Secondary | ICD-10-CM | POA: Diagnosis not present

## 2022-08-02 ENCOUNTER — Other Ambulatory Visit (HOSPITAL_COMMUNITY): Payer: Self-pay | Admitting: Endocrinology

## 2022-08-02 DIAGNOSIS — E049 Nontoxic goiter, unspecified: Secondary | ICD-10-CM | POA: Diagnosis not present

## 2022-08-02 DIAGNOSIS — E559 Vitamin D deficiency, unspecified: Secondary | ICD-10-CM | POA: Diagnosis not present

## 2022-08-02 DIAGNOSIS — R1310 Dysphagia, unspecified: Secondary | ICD-10-CM | POA: Diagnosis not present

## 2022-08-02 DIAGNOSIS — E1165 Type 2 diabetes mellitus with hyperglycemia: Secondary | ICD-10-CM | POA: Diagnosis not present

## 2022-08-02 DIAGNOSIS — Z789 Other specified health status: Secondary | ICD-10-CM | POA: Diagnosis not present

## 2022-08-02 DIAGNOSIS — E7800 Pure hypercholesterolemia, unspecified: Secondary | ICD-10-CM | POA: Diagnosis not present

## 2022-08-02 DIAGNOSIS — E282 Polycystic ovarian syndrome: Secondary | ICD-10-CM | POA: Diagnosis not present

## 2022-08-02 DIAGNOSIS — M069 Rheumatoid arthritis, unspecified: Secondary | ICD-10-CM | POA: Diagnosis not present

## 2022-08-05 ENCOUNTER — Encounter (HOSPITAL_COMMUNITY): Payer: Self-pay | Admitting: Psychiatry

## 2022-08-05 ENCOUNTER — Telehealth (HOSPITAL_BASED_OUTPATIENT_CLINIC_OR_DEPARTMENT_OTHER): Payer: Medicare PPO | Admitting: Psychiatry

## 2022-08-05 VITALS — Wt 168.0 lb

## 2022-08-05 DIAGNOSIS — F419 Anxiety disorder, unspecified: Secondary | ICD-10-CM | POA: Diagnosis not present

## 2022-08-05 DIAGNOSIS — F331 Major depressive disorder, recurrent, moderate: Secondary | ICD-10-CM | POA: Diagnosis not present

## 2022-08-05 DIAGNOSIS — F4321 Adjustment disorder with depressed mood: Secondary | ICD-10-CM

## 2022-08-05 MED ORDER — SERTRALINE HCL 50 MG PO TABS: 50.0000 mg | ORAL_TABLET | Freq: Every day | ORAL | 1 refills | Status: DC

## 2022-08-05 NOTE — Progress Notes (Signed)
[facility] MD Virtual Progress Note   Patient Location: Home Provider Location: Office  I connect with patient by video and verified that I am speaking with correct person by using two identifiers. I discussed the limitations of evaluation and management by telemedicine and the availability of in person appointments. I also discussed with the patient that there may be a patient responsible charge related to this service. The patient expressed understanding and agreed to proceed.  Christina Lewis [id number] 67 y.o.  08/05/2022 9:59 AM  History of Present Illness:  Patient is evaluated by video session.  We started her on Zoloft in the last visit.  She has been doing better and denies any crying spells.  She noticed motivation and more active.  She has started going outside with her sister.  She has not started regular walking yet.  She is still staying with her sister.  Her sleep is still erratic.  She understands that she need to use CPAP but she does not use on a regular basis.  She started grief counseling with hospice and that has been helpful.  She is not sure how many sessions but hoping to at least 7-8 sessions with the grief counseling and then she also going to start grief share program in the community.  So far she is tolerating Zoloft and denies any tremors, shakes, EPS, GI side effects.  She is no longer taking doxepin, gabapentin and Wellbutrin.  She denies any feeling of hopelessness or worthlessness.  She denies any suicidal thoughts.  Recently she started Jardiance from her endocrinologist.  She is not sure how long she will stay with her sister but she feels comfortable staying with her.  Her appetite is okay.  Her energy level is improved from the past.  She denies any panic attacks.  Past Psychiatric History: History of depression in 1990 and given Prozac and Lexapro by PCP.  Saw Dr Evelene Croon in 2012 and prescribed Cymbalta.  Dose increased to 120 mg and last year doxepin  was added.  They were discontinued after found ineffective.  We tried Wellbutrin but had side effects.  Took gabapentin to help neuropathy but ineffective.  No history of suicidal attempt, inpatient treatment, psychosis, mania, abuse, legal issues, seizures, TBI.    Outpatient Encounter Medications as of 08/05/2022  Medication Sig   acetaminophen (TYLENOL) 650 MG CR tablet Take 1,300 mg by mouth every 8 (eight) hours as needed for pain.   aspirin EC 81 MG tablet Take 81 mg by mouth daily. Swallow whole.   baclofen (LIORESAL) 10 MG tablet TAKE 1 TABLET EVERY MORNING AND 1 TABLET AT NOON.   buPROPion (WELLBUTRIN XL) 150 MG 24 hr tablet Take 1 tablet (150 mg total) by mouth every morning. (Patient not taking: Reported on 06/04/2022)   cholecalciferol (VITAMIN D) 25 MCG (1000 UNIT) tablet Take 1,000 Units by mouth daily.   doxepin (SINEQUAN) 50 MG capsule Take 1 capsule (50 mg total) by mouth at bedtime. (Patient not taking: Reported on 07/13/2022)   DULoxetine (CYMBALTA) 30 MG capsule Take 30 mg by mouth daily. (Patient not taking: Reported on 05/10/2022)   Eptinezumab-jjmr (VYEPTI) 100 MG/ML injection Inject into the vein.   ferrous sulfate 325 (65 FE) MG tablet Take 325 mg by mouth daily.   fluticasone (FLONASE ALLERGY RELIEF) 50 MCG/ACT nasal spray Place 1 spray into both nostrils in the morning and at bedtime.   folic acid (FOLVITE) 1 MG tablet Take 2 tablets (2 mg total) by mouth  daily.   gabapentin (NEURONTIN) 300 MG capsule Take 300-600 mg by mouth See admin instructions. 600 mg in the morning, 300 mg at NOON, and 600 mg at bedtime   Reports taking only at night   hydroxychloroquine (PLAQUENIL) 200 MG tablet TAKE 1 TABLET TWICE DAILY MONDAY THRU FRIDAY FOR RHEUMATOID ARTHRTITIS.   MAGNESIUM PO Take 500 mg by mouth at bedtime.   meclizine (ANTIVERT) 25 MG tablet Take 1 tablet (25 mg total) by mouth 3 (three) times daily as needed for dizziness.   methotrexate (RHEUMATREX) 2.5 MG tablet TAKE 8  TABLETS WEEKLY -- PROTECT FROM LIGHT   Multiple Vitamin (MULTIVITAMIN WITH MINERALS) TABS tablet Take 1 tablet by mouth daily.   omeprazole (PRILOSEC) 40 MG capsule Take 40 mg by mouth daily.   ondansetron (ZOFRAN-ODT) 8 MG disintegrating tablet Take 8 mg by mouth every 8 (eight) hours as needed for nausea.    Rimegepant Sulfate (NURTEC) 75 MG TBDP Take 1 tablet (75 mg total) by mouth daily as needed. For migraines. Take as close to onset of migraine as possible. One daily maximum.   sertraline (ZOLOFT) 50 MG tablet Take 1 tablet (50 mg total) by mouth daily.   tiZANidine (ZANAFLEX) 4 MG tablet Take 1 tablet (4 mg total) by mouth at bedtime as needed.   TURMERIC-GINGER PO Take 1 tablet by mouth daily.   No facility-administered encounter medications on file as of 08/05/2022.    Recent Results (from the past 2160 hour(s))  COMPLETE METABOLIC PANEL WITH GFR     Status: Abnormal   Collection Time: 06/04/22  9:36 AM  Result Value Ref Range   Glucose, Bld 117 (H) 65 - 99 mg/dL    Comment: .            Fasting reference interval . For someone without known diabetes, a glucose value between 100 and 125 mg/dL is consistent with prediabetes and should be confirmed with a follow-up test. .    BUN 18 7 - 25 mg/dL   Creat 1.61 0.96 - 0.45 mg/dL   eGFR 94 > OR = 60 WU/JWJ/1.91Y7   BUN/Creatinine Ratio SEE NOTE: 6 - 22 (calc)    Comment:    Not Reported: BUN and Creatinine are within    reference range. .    Sodium 139 135 - 146 mmol/L   Potassium 4.5 3.5 - 5.3 mmol/L   Chloride 106 98 - 110 mmol/L   CO2 24 20 - 32 mmol/L   Calcium 9.9 8.6 - 10.4 mg/dL   Total Protein 6.3 6.1 - 8.1 g/dL   Albumin 4.1 3.6 - 5.1 g/dL   Globulin 2.2 1.9 - 3.7 g/dL (calc)   AG Ratio 1.9 1.0 - 2.5 (calc)   Total Bilirubin 0.2 0.2 - 1.2 mg/dL   Alkaline phosphatase (APISO) 65 37 - 153 U/L   AST 17 10 - 35 U/L   ALT 20 6 - 29 U/L  CBC with Differential/Platelet     Status: Abnormal   Collection Time:  06/04/22  9:36 AM  Result Value Ref Range   WBC 11.4 (H) 3.8 - 10.8 Thousand/uL   RBC 4.11 3.80 - 5.10 Million/uL   Hemoglobin 13.1 11.7 - 15.5 g/dL   HCT 82.9 56.2 - 13.0 %   MCV 93.2 80.0 - 100.0 fL   MCH 31.9 27.0 - 33.0 pg   MCHC 34.2 32.0 - 36.0 g/dL   RDW 86.5 78.4 - 69.6 %   Platelets 353 140 - 400 Thousand/uL  MPV 10.5 7.5 - 12.5 fL   Neutro Abs 7,228 1,500 - 7,800 cells/uL   Lymphs Abs 2,645 850 - 3,900 cells/uL   Absolute Monocytes 969 (H) 200 - 950 cells/uL   Eosinophils Absolute 433 15 - 500 cells/uL   Basophils Absolute 125 0 - 200 cells/uL   Neutrophils Relative % 63.4 %   Total Lymphocyte 23.2 %   Monocytes Relative 8.5 %   Eosinophils Relative 3.8 %   Basophils Relative 1.1 %  B12 and Folate Panel     Status: Abnormal   Collection Time: 06/04/22  9:36 AM  Result Value Ref Range   Vitamin B-12 1,411 (H) 200 - 1,100 pg/mL   Folate >24.0 ng/mL    Comment:                            Reference Range                            Low:           <3.4                            Borderline:    3.4-5.4                            Normal:        >5.4 .      Psychiatric Specialty Exam: Physical Exam  Review of Systems  Weight 168 lb (76.2 kg).There is no height or weight on file to calculate BMI.  General Appearance: Casual  Eye Contact:  Good  Speech:  Clear and Coherent  Volume:  Normal  Mood:  Dysphoric  Affect:  Appropriate  Thought Process:  Goal Directed  Orientation:  Full (Time, Place, and Person)  Thought Content:  Rumination  Suicidal Thoughts:  No  Homicidal Thoughts:  No  Memory:  Immediate;   Good Recent;   Good Remote;   Good  Judgement:  Intact  Insight:  Present  Psychomotor Activity:  Normal  Concentration:  Concentration: Good and Attention Span: Good  Recall:  Good  Fund of Knowledge:  Good  Language:  Good  Akathisia:  No  Handed:  Right  AIMS (if indicated):     Assets:  Communication Skills Desire for Improvement Housing Social  Support Transportation  ADL's:  Intact  Cognition:  WNL  Sleep:  fair     Assessment/Plan: MDD (major depressive disorder), recurrent episode, moderate (HCC) - Plan: sertraline (ZOLOFT) 50 MG tablet  Anxiety - Plan: sertraline (ZOLOFT) 50 MG tablet  Grief - Plan: sertraline (ZOLOFT) 50 MG tablet  Patient doing better on Zoloft.  She is taking 50 mg.  We discussed optimizing the dose but patient does not want to increase the dose yet as like to use more resources to help her anxiety depression and grief.  She is doing hospice therapy and her plan is to start sharing grief group therapy in the community.  So far no major side effects from Zoloft.  Patient agreed to have a follow-up appointment in 2 months.  We will continue Zoloft 50 mg daily.  Recommend to call us back if she has any question or any concern.     Follow Up Instructions:     I discussed the assessment and treatment plan with the  patient. The patient was provided an opportunity to ask questions and all were answered. The patient agreed with the plan and demonstrated an understanding of the instructions.   The patient was advised to call back or seek an in-person evaluation if the symptoms worsen or if the condition fails to improve as anticipated.    Collaboration of Care: Other provider involved in patient's care AEB notes are available in epic to review.  Patient/Guardian was advised Release of Information must be obtained prior to any record release in order to collaborate their care with an outside provider. Patient/Guardian was advised if they have not already done so to contact the registration department to sign all necessary forms in order for [place] to release information regarding their care.   Consent: Patient/Guardian gives verbal consent for treatment and assignment of benefits for services provided during this visit. Patient/Guardian expressed understanding and agreed to proceed.     I provided 19 minutes of  non face to face time during this encounter.  Note: This document was prepared by [employer] voice dictation technology and any errors that results from this process are unintentional.    Cleotis Nipper, MD 08/05/2022

## 2022-08-08 ENCOUNTER — Other Ambulatory Visit: Payer: Self-pay | Admitting: Rheumatology

## 2022-08-09 NOTE — Telephone Encounter (Signed)
Last Fill: 06/04/2022   Next Visit: 09/09/2022  Last Visit: 06/04/2022  Dx: Fibromyalgia   Current Dose per office note on 06/04/2022: baclofen 10 mg 1 tablet in the morning and 1 tablet in the afternoon as well as tizanidine 4 mg at bedtime   Okay to refill Baclofen and Tizanidine?

## 2022-08-19 ENCOUNTER — Encounter: Payer: Self-pay | Admitting: Neurology

## 2022-08-23 ENCOUNTER — Other Ambulatory Visit: Payer: Self-pay | Admitting: Neurology

## 2022-08-23 DIAGNOSIS — G43901 Migraine, unspecified, not intractable, with status migrainosus: Secondary | ICD-10-CM

## 2022-08-23 MED ORDER — BUTALBITAL-APAP-CAFFEINE 50-325-40 MG PO TABS: 1.0000 | ORAL_TABLET | Freq: Four times a day (QID) | ORAL | 0 refills | Status: DC | PRN

## 2022-08-26 NOTE — Progress Notes (Signed)
Office Visit Note  Patient: Christina Lewis             Date of Birth: [birth year]           MRN: [id number]             PCP: Sonny Masters, FNP Referring: Sonny Masters, FNP Visit Date: 09/09/2022 Occupation: @GUAROCC @  Subjective:  Neck and lower back pain   History of Present Illness: Christina Lewis is a 67 y.o. female with history of seronegative rheumatoid arthritis and osteoarthritis.  Patient remains on Methotrexate 8 tablets by mouth once weekly, folic acid 2 mg daily, and Plaquenil 200 mg 1 tablet by mouth twice daily Monday through Friday.  She is tolerating combination therapy without any side effects.  Patient states that she continues to have some ongoing pain and inflammation in both hands.  Patient states that she would like to switch from oral to injectable methotrexate as discussed at her last office visit.  She says she is also been having increased discomfort in her neck and lower back.  She is planning on following up with Dr. Lovell Sheehan for further evaluation.  She has been having left-sided radiculopathy in her arm and right-sided radiculopathy in her right leg.   Activities of Daily Living:  Patient reports morning stiffness for at least 1-2  hours.   Patient Reports nocturnal pain.  Difficulty dressing/grooming: Denies Difficulty climbing stairs: Denies Difficulty getting out of chair: Reports Difficulty using hands for taps, buttons, cutlery, and/or writing: Reports  Review of Systems  Constitutional:  Positive for fatigue.  HENT:  Positive for mouth dryness and nose dryness. Negative for mouth sores.   Eyes:  Positive for visual disturbance and dryness. Negative for pain.  Respiratory:  Negative for cough, hemoptysis, shortness of breath and difficulty breathing.   Cardiovascular:  Negative for chest pain, palpitations, hypertension and swelling in legs/feet.  Gastrointestinal:  Negative for blood in stool, constipation and diarrhea.  Endocrine: Negative for  increased urination.  Genitourinary:  Negative for painful urination.  Musculoskeletal:  Positive for joint pain, joint pain, myalgias, muscle weakness, morning stiffness, muscle tenderness and myalgias. Negative for joint swelling.  Skin:  Positive for color change. Negative for pallor, rash, hair loss, nodules/bumps, skin tightness, ulcers and sensitivity to sunlight.  Allergic/Immunologic: Negative for susceptible to infections.  Neurological:  Positive for numbness and headaches. Negative for dizziness and weakness.  Hematological:  Negative for swollen glands.  Psychiatric/Behavioral:  Positive for depressed mood and sleep disturbance. The patient is not nervous/anxious.     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   Vitamin D deficiency 10/15/2021   Polycystic ovaries 10/15/2021   Migraine without aura and without status migrainosus, not intractable 10/15/2021   BMI 29.0-29.9,adult 10/15/2021   Cellulitis 03/04/2021   Rheumatoid arthritis (HCC) 03/04/2021   Diabetes mellitus type 2 in obese 03/04/2021   Right foot infection 03/04/2021   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 myelopathy 09/11/2019   Leukocytosis 11/04/2016   Fibromyalgia 02/10/2016   Other fatigue 02/10/2016   Primary osteoarthritis of both hands 02/10/2016   History of migraine 02/10/2016   History of thyroid nodule 02/10/2016   History of sleep apnea 02/10/2016  History of renal calculi 02/10/2016   Pain in joint, shoulder region 10/02/2010   Adhesive capsulitis of shoulder 10/02/2010   Muscle weakness (generalized) 10/02/2010    Past Medical History:  Diagnosis Date   Allergy    Anxiety    Arthritis     Depression    Diabetes mellitus    Fibromyalgia    GERD (gastroesophageal reflux disease)    Gout    Hematuria    History of kidney stones    History of tics    Ineffective esophageal motility    Interstitial cystitis 2016   Migraines    Osteoarthritis    PCOS (polycystic ovarian syndrome) 1993   Pneumonia 2019   PONV (postoperative nausea and vomiting)    Sleep apnea    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, took medication for a year   Thyroid nodule     Family History  Problem Relation Age of Onset   Fibromyalgia Mother    Psoriasis Mother        psoriatic arthritis    Diabetes Mother    Heart disease Mother    Psoriasis Sister        psoriatic arthritis    Diabetes Sister    Migraines Sister    Rheum arthritis Sister    Fibromyalgia Sister    Diabetes Sister    Past Surgical History:  Procedure Laterality Date   ABDOMINAL HYSTERECTOMY  2000   APPENDECTOMY  1976   BALLOON DILATION N/A 11/03/2021   Procedure: Marvis Repress DILATION;  Surgeon: Kerin Salen, MD;  Location: [facility];  Service: Gastroenterology;  Laterality: N/A;   BOTOX INJECTION N/A 11/03/2021   Procedure: BOTOX INJECTION;  Surgeon: Kerin Salen, MD;  Location: [facility];  Service: Gastroenterology;  Laterality: N/A;   BREAST LUMPECTOMY  1989   lt-negative   CARDIOVASCULAR STRESS TEST  2000   CATARACT EXTRACTION W/PHACO Left 12/07/2019   Procedure: CATARACT EXTRACTION PHACO AND INTRAOCULAR LENS PLACEMENT (IOC);  Surgeon: [name], MD;  Location: [facility];  Service: Ophthalmology;  Laterality: Left;  CDE: 5.55   CHOLECYSTECTOMY  1985   COLONOSCOPY     ESOPHAGEAL MANOMETRY N/A 08/24/2021   Procedure: ESOPHAGEAL MANOMETRY (EM);  Surgeon: Kerin Salen, MD;  Location: [facility];  Service: Gastroenterology;  Laterality: N/A;   ESOPHAGOGASTRODUODENOSCOPY N/A 11/03/2021   Procedure: ESOPHAGOGASTRODUODENOSCOPY (EGD);  Surgeon: Kerin Salen, MD;  Location: [facility];  Service:  Gastroenterology;  Laterality: N/A;   HYSTERECTOMY ABDOMINAL WITH SALPINGECTOMY Bilateral 2000   KIDNEY STONE SURGERY Right 2014   with stent placement in OR   KNEE ARTHROSCOPY Right 08/22/2013   Procedure: ARTHROSCOPY RIGHT KNEE FOR INFECTION LAVAGE AND DRAINAGE;  Surgeon: Thera Flake., MD;  Location: [facility];  Service: Orthopedics;  Laterality: Right;   KNEE ARTHROSCOPY WITH PATELLA RECONSTRUCTION Right 08/06/2013   Procedure: RIGHT KNEE ARTHROSCOPY WITH MENISCECTOMY MEDIAL, ARTHROSCOPY KNEE WITH DEBRIDEMENT/SHAVING (CHONDROPLASTY) ;  Surgeon: Thera Flake., MD;  Location: [facility];  Service: Orthopedics;  Laterality: Right;   LUMBAR LAMINECTOMY/DECOMPRESSION MICRODISCECTOMY Right 09/11/2019   Procedure: Right Lumbar Three-Four Microdiscectomy;  Surgeon: Maeola Harman, MD;  Location: [facility];  Service: Neurosurgery;  Laterality: Right;  Right Lumbar Three-Four Microdiscectomy   NASAL SINUS SURGERY     x4   OOPHORECTOMY     SHOULDER SURGERY  2011   right shoulder   THYROID SURGERY  1994   TONSILLECTOMY Bilateral 1974   Social History   Social  History Narrative   Caffiene 4-5 12 oz cans soda.   Working retired - special ed.   Live with home no kids   Immunization History  Administered Date(s) Administered   Tdap 03/04/2021   Zoster Recombinant(Shingrix) 10/15/2021     Objective: Vital Signs: BP 125/79 (BP Location: Left Arm, Patient Position: Sitting, Cuff Size: Normal)   Pulse 81   Resp 14   Ht 5\' 4"  (1.626 m)   Wt 164 lb 12.8 oz (74.8 kg)   BMI 28.29 kg/m    Physical Exam Vitals and nursing note reviewed.  Constitutional:      Appearance: She is well-developed.  HENT:     Head: Normocephalic and atraumatic.  Eyes:     Conjunctiva/sclera: Conjunctivae normal.  Cardiovascular:     Rate and Rhythm: Normal rate and regular rhythm.     Heart sounds: Normal heart sounds.  Pulmonary:     Effort: Pulmonary effort is normal.     Breath  sounds: Normal breath sounds.  Abdominal:     General: Bowel sounds are normal.     Palpations: Abdomen is soft.  Musculoskeletal:     Cervical back: Normal range of motion.  Lymphadenopathy:     Cervical: No cervical adenopathy.  Skin:    General: Skin is warm and dry.     Capillary Refill: Capillary refill takes less than 2 seconds.  Neurological:     Mental Status: She is alert and oriented to person, place, and time.  Psychiatric:        Behavior: Behavior normal.      Musculoskeletal Exam: Generalized hyperalgesia and positive tender points on exam.  C-spine has limited range of motion with lateral rotation especially to the left.  Left trapezius muscle tension and tenderness.  Painful range of motion of the left shoulder.  Elbow joints and wrist joints have good range of motion.  Tenderness and synovitis over the right second MCP joint.  Tenderness over several MCP and PIP joints as described below.  Hip joints have good range of motion with no groin pain.  Tenderness over the right trochanteric bursa.  Knee joints have good range of motion with no warmth or effusion.  Ankle joints have good range of motion with no tenderness or joint swelling.  CDAI Exam: CDAI Score: 13  Patient Global: 30 / 100; Provider Global: 30 / 100 Swollen: 1 ; Tender: 8  Joint Exam 09/09/2022      Right  Left  MCP 2  Swollen Tender     MCP 3   Tender     MCP 4   Tender     PIP 2 (finger)   Tender     PIP 3 (finger)   Tender     PIP 4 (finger)      Tender  Cervical Spine   Tender     Lumbar Spine   Tender        Investigation: No additional findings.  Imaging: No results found.  Recent Labs: Lab Results  Component Value Date   WBC 11.4 (H) 06/04/2022   HGB 13.1 06/04/2022   PLT 353 06/04/2022   NA 139 06/04/2022   K 4.5 06/04/2022   CL 106 06/04/2022   CO2 24 06/04/2022   GLUCOSE 117 (H) 06/04/2022   BUN 18 06/04/2022   CREATININE 0.71 06/04/2022   BILITOT 0.2 06/04/2022    ALKPHOS 82 10/15/2021   AST 17 06/04/2022   ALT 20 06/04/2022   PROT 6.3 06/04/2022  ALBUMIN 4.5 10/15/2021   CALCIUM 9.9 06/04/2022   GFRAA 98 03/24/2020   QFTBGOLDPLUS NEGATIVE 09/29/2021    Speciality Comments: PLQ Eye Exam: 10/07/2021 WNL @ [facility]  f/u 01/2022  Procedures:  No procedures performed Allergies: Sulfa antibiotics, Atorvastatin, Codeine, Erythromycin, Metronidazole, Morphine and codeine, Semaglutide, and Sulfamethoxazole-trimethoprim    Assessment / Plan:     Visit Diagnoses: Rheumatoid arthritis of multiple sites with negative rheumatoid factor [facility]): Patient presents today with ongoing pain and intermittent inflammation in both hands.  On examination today she has tenderness and synovitis over the right second MCP joint.  She also has tenderness in several MCP and PIP joints as described above.  She has been taking methotrexate 8 tablets by mouth once weekly, folic acid 2 mg daily, Plaquenil 200 mg 1 tablet by mouth twice daily Monday through Friday.  She is tolerating combination therapy without any side effects.  Patient has noticed a significant improvement since adding on methotrexate as combination therapy but continues to have active inflammation.  Discussed switching from oral to injectable methotrexate at her last office visit and she was in agreement today.  Plan to apply for Rasuvo 20 mg subcutaneous injections once weekly.  She will remain on folic acid 2 mg daily and Plaquenil 200 mg 1 tablet by mouth twice daily Monday through Friday.  Patient was advised to discontinue oral methotrexate once Rasuvo has been approved.  She was advised to notify us if she continues to have persistent joint pain and inflammation.  She will follow-up in the office in 3 months or sooner if needed.  High risk medication use - Applying for Rasuvo 20 mg sq injections once weekly, folic acid 2 mg daily, and Plaquenil 200 mg 1 tablet by mouth twice daily Monday through  Friday. CBC and CMP updated on 06/04/22.  Orders for CBC and CMP released today. Her next lab work will be due in October and every 3 months.  She is currently on a course of doxycycline.  Patient was advised to hold methotrexate if her infection has not completely cleared and until she has completed the course of doxycycline.  Discussed the importance of holding methotrexate if she develops signs or symptoms of an infection and to resume once the infection has completely cleared.    PLQ Eye Exam: 10/07/2021 WNL @ [facility].  Patient was given a plaquenil eye examination form.   - Plan: CBC with Differential/Platelet, COMPLETE METABOLIC PANEL WITH GFR  Primary osteoarthritis of both hands: PIP and DIP thickening consistent with osteoarthritis of both hands.  Patient continues to experience tenderness and intermittent inflammation in both hands.  Planning on switching from oral to injectable methotrexate to try to improve the efficacy as discussed above. Discussed the importance of joint protection and muscle strengthening.   Primary osteoarthritis of right knee: Good range of motion of the right knee joint on examination today.  No warmth or effusion noted.  Trochanteric bursitis of right hip: Tenderness over the right trochanteric bursa noted.  Primary osteoarthritis of both feet: Good range of motion of both ankle joints with no tenderness or synovitis.  Other fatigue: Chronic, stable.  DDD (degenerative disc disease), cervical: MRI of the cervical spine revealed mild cervical spondylosis.  Mild left foraminal narrowing at C3-C4 and C5-C6 noted.  She has been experiencing increased discomfort and stiffness in the C-spine.  She is also noticed increased crepitus as well as some left-sided radiculopathy.  Advised patient to follow-up with  Dr. Lovell Sheehan.  DDD (degenerative disc disease), lumbar: Chronic pain.  Under the care of Dr. Lovell Sheehan.  Patient has been experiencing right-sided  radiculopathy.  Patient was advised to schedule a follow-up visit with Dr. Lovell Sheehan for further evaluation.  Chronic SI joint pain: No SI joint discomfort currently.  Fibromyalgia: Patient continues to have generalized hyperalgesia and positive tender points on exam.  She continues to have intermittent myalgias and muscle tenderness due to fibromyalgia.  She takes tizanidine 4 mg at bedtime as needed for muscle spasms.  Discussed the importance of regular exercise and good sleep hygiene.  Other medical conditions are listed as follows:  Family history of rheumatoid arthritis-Sister  Family history of psoriatic arthritis-mother and sister  History of tics  History of diabetes mellitus  History of sleep apnea  History of thyroid nodule  History of gastroesophageal reflux (GERD)  History of renal calculi  History of depression  History of cellulitis  History of positive PPD  History of migraine  History of PCOS  Orders: Orders Placed This Encounter  Procedures   CBC with Differential/Platelet   COMPLETE METABOLIC PANEL WITH GFR   No orders of the defined types were placed in this encounter.   Follow-Up Instructions: Return in about 3 months (around 12/10/2022) for Rheumatoid arthritis, Osteoarthritis.   Gearldine Bienenstock, PA-C  Note - This record has been created using Dragon software.  Chart creation errors have been sought, but may not always  have been located. Such creation errors do not reflect on  the standard of medical care.

## 2022-08-29 ENCOUNTER — Other Ambulatory Visit: Payer: Self-pay | Admitting: Physician Assistant

## 2022-08-30 NOTE — Telephone Encounter (Signed)
Last Fill: 06/04/2022  Labs: 06/04/2022 Glucose is 117. Rest of CMP WNL WBC count is borderline elevated-stable. We will continue to monitor. Vitamin B12 is elevated-please clarify if she is taking a supplement/injections? Folate WNL  Next Visit: 09/09/2022  Last Visit: 06/04/2022  DX: Rheumatoid arthritis of multiple sites with negative rheumatoid factor   Current Dose per office note on 06/04/2022: Methotrexate 8 tablets by mouth once weekly   Patient will update labs at scheduled appointment on 09/09/2022.  Okay to refill Methotrexate?

## 2022-09-01 ENCOUNTER — Ambulatory Visit: Payer: Medicare PPO | Admitting: Family Medicine

## 2022-09-01 ENCOUNTER — Encounter: Payer: Self-pay | Admitting: Family Medicine

## 2022-09-01 VITALS — BP 112/75 | HR 87 | Temp 97.2°F | Ht 60.0 in | Wt 164.0 lb

## 2022-09-01 DIAGNOSIS — H9203 Otalgia, bilateral: Secondary | ICD-10-CM

## 2022-09-01 DIAGNOSIS — R519 Headache, unspecified: Secondary | ICD-10-CM | POA: Diagnosis not present

## 2022-09-01 DIAGNOSIS — J0141 Acute recurrent pansinusitis: Secondary | ICD-10-CM

## 2022-09-01 MED ORDER — DOXYCYCLINE HYCLATE 100 MG PO TABS: 100.0000 mg | ORAL_TABLET | Freq: Two times a day (BID) | ORAL | 0 refills | Status: DC

## 2022-09-01 NOTE — Patient Instructions (Addendum)
Mucinex and increase water intake  Saline nasal spray during day between flonase

## 2022-09-01 NOTE — Progress Notes (Signed)
`    Subjective:  Patient ID: Christina Lewis, female    DOB: 11-29-1955, 67 y.o.   MRN: [id number]  Patient Care Team: Sonny Masters, FNP as PCP - General (Family Medicine) Smitty Cords, OD (Optometry)   Chief Complaint:  Headache, Cough, and sneezing  (X 1 week )   HPI: Christina Lewis is a 67 y.o. female presenting on 09/01/2022 for Headache, Cough, and sneezing  (X 1 week )   Headache  This is a chronic problem. The current episode started more than 1 month ago. The problem occurs intermittently. The problem has been gradually worsening. The pain is located in the Bilateral and frontal region. The pain radiates to the face (sinuse and ears). The quality of the pain is described as aching. The pain is at a severity of 5/10. Associated symptoms include coughing, drainage, ear pain, eye pain (orbital pain), hearing loss, insomnia, rhinorrhea, sinus pressure and a sore throat (in morning). Pertinent negatives include no fever. Treatments tried: firoricet. The treatment provided mild relief.  Cough This is a recurrent problem. The current episode started more than 1 month ago. Associated symptoms include ear pain, headaches, rhinorrhea and a sore throat (in morning). Pertinent negatives include no fever. The symptoms are aggravated by lying down. She has tried cool air and OTC cough suppressant (xizal and flonase) for the symptoms. Her past medical history is significant for environmental allergies. chroonic sinus problems sinc teenager years.  Ear pain Intermittent and positional. States they feel plugged and can hear fluid moving in both ears. Denies external drainage Pt states Right ear worse than Left ear. Denies swollen lymphnodes. Denies fever. Throat is only mildly sore. Denies any OTC treatments or medications. Pt also states that she has pain and tenderness with pressing on Frontal and maxillary sinuses.   Relevant past medical, surgical, family, and social history reviewed and  updated as indicated.  Allergies and medications reviewed and updated. Data reviewed: Chart in Epic.   Past Medical History:  Diagnosis Date   Allergy    Anxiety    Arthritis    Depression    Diabetes mellitus    Fibromyalgia    GERD (gastroesophageal reflux disease)    Gout    Hematuria    History of kidney stones    History of tics    Ineffective esophageal motility    Interstitial cystitis 2016   Migraines    Osteoarthritis    PCOS (polycystic ovarian syndrome) 1993   Pneumonia 2019   PONV (postoperative nausea and vomiting)    Sleep apnea    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, took medication for a year   Thyroid nodule     Past Surgical History:  Procedure Laterality Date   ABDOMINAL HYSTERECTOMY  2000   APPENDECTOMY  1976   BALLOON DILATION N/A 11/03/2021   Procedure: BALLOON DILATION;  Surgeon: Kerin Salen, MD;  Location: [facility];  Service: Gastroenterology;  Laterality: N/A;   BOTOX INJECTION N/A 11/03/2021   Procedure: BOTOX INJECTION;  Surgeon: Kerin Salen, MD;  Location: [facility];  Service: Gastroenterology;  Laterality: N/A;   BREAST LUMPECTOMY  1989   lt-negative   CARDIOVASCULAR STRESS TEST  2000   CATARACT EXTRACTION W/PHACO Left 12/07/2019   Procedure: CATARACT EXTRACTION PHACO AND INTRAOCULAR LENS PLACEMENT (IOC);  Surgeon: [name], MD;  Location: [facility];  Service: Ophthalmology;  Laterality: Left;  CDE: 5.55   CHOLECYSTECTOMY  1985   COLONOSCOPY  ESOPHAGEAL MANOMETRY N/A 08/24/2021   Procedure: ESOPHAGEAL MANOMETRY (EM);  Surgeon: Kerin Salen, MD;  Location: [facility];  Service: Gastroenterology;  Laterality: N/A;   ESOPHAGOGASTRODUODENOSCOPY N/A 11/03/2021   Procedure: ESOPHAGOGASTRODUODENOSCOPY (EGD);  Surgeon: Kerin Salen, MD;  Location: [facility];  Service: Gastroenterology;  Laterality: N/A;   HYSTERECTOMY ABDOMINAL WITH SALPINGECTOMY Bilateral 2000   KIDNEY STONE SURGERY Right 2014   with stent  placement in OR   KNEE ARTHROSCOPY Right 08/22/2013   Procedure: ARTHROSCOPY RIGHT KNEE FOR INFECTION LAVAGE AND DRAINAGE;  Surgeon: Thera Flake., MD;  Location: [facility];  Service: Orthopedics;  Laterality: Right;   KNEE ARTHROSCOPY WITH PATELLA RECONSTRUCTION Right 08/06/2013   Procedure: RIGHT KNEE ARTHROSCOPY WITH MENISCECTOMY MEDIAL, ARTHROSCOPY KNEE WITH DEBRIDEMENT/SHAVING (CHONDROPLASTY) ;  Surgeon: Thera Flake., MD;  Location: [facility];  Service: Orthopedics;  Laterality: Right;   LUMBAR LAMINECTOMY/DECOMPRESSION MICRODISCECTOMY Right 09/11/2019   Procedure: Right Lumbar Three-Four Microdiscectomy;  Surgeon: Maeola Harman, MD;  Location: [facility];  Service: Neurosurgery;  Laterality: Right;  Right Lumbar Three-Four Microdiscectomy   NASAL SINUS SURGERY     x4   OOPHORECTOMY     SHOULDER SURGERY  2011   right shoulder   THYROID SURGERY  1994   TONSILLECTOMY Bilateral 1974    Social History   Socioeconomic History   Marital status: Widowed    Spouse name: Chrissie Noa   Number of children: Not on file   Years of education: Not on file   Highest education level: Bachelor's degree (e.g., BA, AB, BS)  Occupational History   Occupation: retired  Tobacco Use   Smoking status: Never    Passive exposure: Past   Smokeless tobacco: Never  Vaping Use   Vaping Use: Never used  Substance and Sexual Activity   Alcohol use: No   Drug use: Never   Sexual activity: Yes  Other Topics Concern   Not on file  Social History Narrative   Caffiene 4-5 12 oz cans soda.   Working retired - special ed.   Live with home no kids   Social Determinants of Health   Financial Resource Strain: Low Risk  (08/30/2022)   Overall Financial Resource Strain (CARDIA)    Difficulty of Paying Living Expenses: Not very hard  Food Insecurity: No Food Insecurity (08/30/2022)   Hunger Vital Sign    Worried About Running Out of Food in the Last Year: Never true    Ran Out of Food  in the Last Year: Never true  Transportation Needs: No Transportation Needs (08/30/2022)   PRAPARE - Administrator, Civil Service (Medical): No    Lack of Transportation (Non-Medical): No  Physical Activity: Inactive (08/30/2022)   Exercise Vital Sign    Days of Exercise per Week: 0 days    Minutes of Exercise per Session: 0 min  Stress: Stress Concern Present (08/30/2022)   [employer] of Occupational Health - Occupational Stress Questionnaire    Feeling of Stress : Very much  Social Connections: Moderately Isolated (08/30/2022)   Social Connection and Isolation Panel [NHANES]    Frequency of Communication with Friends and Family: More than three times a week    Frequency of Social Gatherings with Friends and Family: More than three times a week    Attends Religious Services: 1 to 4 times per year    Active Member of [employer] or Organizations: No    Attends Banker Meetings: Never    Marital Status:  Widowed  Intimate Partner Violence: Not At Risk (02/09/2022)   Humiliation, Afraid, Rape, and Kick questionnaire    Fear of Current or Ex-Partner: No    Emotionally Abused: No    Physically Abused: No    Sexually Abused: No    Outpatient Encounter Medications as of 09/01/2022  Medication Sig   acetaminophen (TYLENOL) 650 MG CR tablet Take 1,300 mg by mouth every 8 (eight) hours as needed for pain.   aspirin EC 81 MG tablet Take 81 mg by mouth daily. Swallow whole.   baclofen (LIORESAL) 10 MG tablet TAKE 1 TABLET EVERY MORNING AND 1 TABLET AT NOON.   butalbital-acetaminophen-caffeine (FIORICET) 50-325-40 MG tablet Take 1-2 tablets by mouth every 6 (six) hours as needed for headache.   cholecalciferol (VITAMIN D) 25 MCG (1000 UNIT) tablet Take 1,000 Units by mouth daily.   doxycycline (VIBRA-TABS) 100 MG tablet Take 1 tablet (100 mg total) by mouth 2 (two) times daily.   Eptinezumab-jjmr (VYEPTI) 100 MG/ML injection Inject into the vein.   ferrous sulfate 325 (65 FE)  MG tablet Take 325 mg by mouth daily.   fluticasone (FLONASE ALLERGY RELIEF) 50 MCG/ACT nasal spray Place 1 spray into both nostrils in the morning and at bedtime.   folic acid (FOLVITE) 1 MG tablet Take 2 tablets (2 mg total) by mouth daily.   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   JARDIANCE 10 MG TABS tablet Take 10 mg by mouth daily.   MAGNESIUM PO Take 500 mg by mouth at bedtime.   methotrexate (RHEUMATREX) 2.5 MG tablet take 8 tablets weekly -- protect from light   Multiple Vitamin (MULTIVITAMIN WITH MINERALS) TABS tablet Take 1 tablet by mouth daily.   omeprazole (PRILOSEC) 40 MG capsule Take 40 mg by mouth daily.   ondansetron (ZOFRAN-ODT) 8 MG disintegrating tablet Take 8 mg by mouth every 8 (eight) hours as needed for nausea.    Rimegepant Sulfate (NURTEC) 75 MG TBDP Take 1 tablet (75 mg total) by mouth daily as needed. For migraines. Take as close to onset of migraine as possible. One daily maximum.   sertraline (ZOLOFT) 50 MG tablet Take 1 tablet (50 mg total) by mouth daily.   tiZANidine (ZANAFLEX) 4 MG tablet TAKE 1 TABLET AT BEDTIME AS NEEDED   TURMERIC-GINGER PO Take 1 tablet by mouth daily.   No facility-administered encounter medications on file as of 09/01/2022.    Allergies  Allergen Reactions   Sulfa Antibiotics Anaphylaxis   Atorvastatin Other (See Comments)   Codeine Itching   Erythromycin Nausea And Vomiting   Metronidazole Diarrhea   Morphine And Codeine Itching   Semaglutide Nausea And Vomiting   Sulfamethoxazole-Trimethoprim Other (See Comments)    Review of Systems  Constitutional:  Negative for fever.  HENT:  Positive for ear pain, hearing loss, rhinorrhea, sinus pressure and sore throat (in morning).   Eyes:  Positive for pain (orbital pain).  Respiratory:  Positive for cough.   Cardiovascular: Negative.   Gastrointestinal: Negative.   Endocrine: Negative.   Genitourinary:  Negative.   Musculoskeletal: Negative.   Skin: Negative.   Allergic/Immunologic: Positive for environmental allergies.  Neurological:  Positive for headaches.  Hematological: Negative.   Psychiatric/Behavioral:  Negative for self-injury, sleep disturbance and suicidal ideas. The patient has insomnia. The patient is not nervous/anxious.   All other systems reviewed and are negative.       Objective:  BP 112/75   Pulse 87  Temp (!) 97.2 F (36.2 C) (Temporal)   Ht 5' (1.524 m)   Wt 164 lb (74.4 kg)   SpO2 93%   BMI 32.03 kg/m    Wt Readings from Last 3 Encounters:  09/01/22 164 lb (74.4 kg)  06/04/22 169 lb 9.6 oz (76.9 kg)  04/08/22 169 lb (76.7 kg)    Physical Exam Vitals and nursing note reviewed.  Constitutional:      Appearance: She is well-developed and normal weight.  HENT:     Head: Normocephalic and atraumatic.     Mouth/Throat:     Mouth: Mucous membranes are moist.  Eyes:     Extraocular Movements: Extraocular movements intact.  Cardiovascular:     Rate and Rhythm: Normal rate. Rhythm irregular.  Pulmonary:     Effort: Pulmonary effort is normal.  Abdominal:     Palpations: Abdomen is soft.  Musculoskeletal:        General: Normal range of motion.     Cervical back: Normal range of motion.  Lymphadenopathy:     Head:     Right side of head: No submental, submandibular, tonsillar, preauricular, posterior auricular or occipital adenopathy.     Left side of head: No submental, submandibular, tonsillar, preauricular, posterior auricular or occipital adenopathy.     Cervical: No cervical adenopathy.  Skin:    General: Skin is warm.     Capillary Refill: Capillary refill takes less than 2 seconds.  Neurological:     Mental Status: She is alert.  Psychiatric:        Mood and Affect: Mood normal.    Results for orders placed or performed in visit on 06/04/22  COMPLETE METABOLIC PANEL WITH GFR  Result Value Ref Range   Glucose, Bld 117 (H) 65 - 99  mg/dL   BUN 18 7 - 25 mg/dL   Creat 1.61 0.96 - 0.45 mg/dL   eGFR 94 > OR = 60 WU/JWJ/1.91Y7   BUN/Creatinine Ratio SEE NOTE: 6 - 22 (calc)   Sodium 139 135 - 146 mmol/L   Potassium 4.5 3.5 - 5.3 mmol/L   Chloride 106 98 - 110 mmol/L   CO2 24 20 - 32 mmol/L   Calcium 9.9 8.6 - 10.4 mg/dL   Total Protein 6.3 6.1 - 8.1 g/dL   Albumin 4.1 3.6 - 5.1 g/dL   Globulin 2.2 1.9 - 3.7 g/dL (calc)   AG Ratio 1.9 1.0 - 2.5 (calc)   Total Bilirubin 0.2 0.2 - 1.2 mg/dL   Alkaline phosphatase (APISO) 65 37 - 153 U/L   AST 17 10 - 35 U/L   ALT 20 6 - 29 U/L  CBC with Differential/Platelet  Result Value Ref Range   WBC 11.4 (H) 3.8 - 10.8 Thousand/uL   RBC 4.11 3.80 - 5.10 Million/uL   Hemoglobin 13.1 11.7 - 15.5 g/dL   HCT 82.9 56.2 - 13.0 %   MCV 93.2 80.0 - 100.0 fL   MCH 31.9 27.0 - 33.0 pg   MCHC 34.2 32.0 - 36.0 g/dL   RDW 86.5 78.4 - 69.6 %   Platelets 353 140 - 400 Thousand/uL   MPV 10.5 7.5 - 12.5 fL   Neutro Abs 7,228 1,500 - 7,800 cells/uL   Lymphs Abs 2,645 850 - 3,900 cells/uL   Absolute Monocytes 969 (H) 200 - 950 cells/uL   Eosinophils Absolute 433 15 - 500 cells/uL   Basophils Absolute 125 0 - 200 cells/uL   Neutrophils Relative % 63.4 %   Total Lymphocyte  23.2 %   Monocytes Relative 8.5 %   Eosinophils Relative 3.8 %   Basophils Relative 1.1 %  B12 and Folate Panel  Result Value Ref Range   Vitamin B-12 1,411 (H) 200 - 1,100 pg/mL   Folate >24.0 ng/mL       Pertinent labs & imaging results that were available during my care of the patient were reviewed by me and considered in my medical decision making.  Assessment & Plan:  Christina Lewis was seen today for headache, cough and sneezing .  Diagnoses and all orders for this visit:  Sinus headache -     Ambulatory referral to ENT  Ear pain, bilateral Right mid ear effusion  -     Ambulatory referral to ENT  Acute recurrent pansinusitis Ongoing for over 1.5 weeks despite conservative treatment measures. Will add below  to regimen. Referral to ENT placed as this is recurrent.  -     doxycycline (VIBRA-TABS) 100 MG tablet; Take 1 tablet (100 mg total) by mouth 2 (two) times daily. -     Ambulatory referral to ENT   Continue all other maintenance medications.  Follow up plan: Return if symptoms worsen or fail to improve. Follow up with EENT Consult. Pt does not want Dr Jodean Lima as Rodena Piety. EENT will call patient to schedule   Continue healthy lifestyle choices, including diet (rich in fruits, vegetables, and lean proteins, and low in salt and simple carbohydrates) and exercise (at least 30 minutes of moderate physical activity daily).   The above assessment and management plan was discussed with the patient. The patient verbalized understanding of and has agreed to the management plan. Patient is aware to call the clinic if they develop any new symptoms or if symptoms persist or worsen. Patient is aware when to return to the clinic for a follow-up visit. Patient educated on when it is appropriate to go to the emergency department.   Maryelizabeth Kaufmann NP student [facility] [phone]  I personally was present during the history, physical exam, and medical decision-making activities of this visit and have verified that the services and findings are accurately documented in the nurse practitioner student's note.  Kari Baars, FNP-C [facility] [address] [place], [place] [address] [phone]

## 2022-09-09 ENCOUNTER — Encounter: Payer: Self-pay | Admitting: Physician Assistant

## 2022-09-09 ENCOUNTER — Ambulatory Visit: Payer: Medicare PPO | Attending: Physician Assistant | Admitting: Physician Assistant

## 2022-09-09 VITALS — BP 125/79 | HR 81 | Resp 14 | Ht 64.0 in | Wt 164.8 lb

## 2022-09-09 DIAGNOSIS — M19041 Primary osteoarthritis, right hand: Secondary | ICD-10-CM

## 2022-09-09 DIAGNOSIS — M5030 Other cervical disc degeneration, unspecified cervical region: Secondary | ICD-10-CM | POA: Diagnosis not present

## 2022-09-09 DIAGNOSIS — M0609 Rheumatoid arthritis without rheumatoid factor, multiple sites: Secondary | ICD-10-CM

## 2022-09-09 DIAGNOSIS — M797 Fibromyalgia: Secondary | ICD-10-CM

## 2022-09-09 DIAGNOSIS — M19071 Primary osteoarthritis, right ankle and foot: Secondary | ICD-10-CM

## 2022-09-09 DIAGNOSIS — Z872 Personal history of diseases of the skin and subcutaneous tissue: Secondary | ICD-10-CM

## 2022-09-09 DIAGNOSIS — M7061 Trochanteric bursitis, right hip: Secondary | ICD-10-CM

## 2022-09-09 DIAGNOSIS — M5136 Other intervertebral disc degeneration, lumbar region: Secondary | ICD-10-CM

## 2022-09-09 DIAGNOSIS — M1711 Unilateral primary osteoarthritis, right knee: Secondary | ICD-10-CM | POA: Diagnosis not present

## 2022-09-09 DIAGNOSIS — M19042 Primary osteoarthritis, left hand: Secondary | ICD-10-CM

## 2022-09-09 DIAGNOSIS — Z8669 Personal history of other diseases of the nervous system and sense organs: Secondary | ICD-10-CM

## 2022-09-09 DIAGNOSIS — Z9289 Personal history of other medical treatment: Secondary | ICD-10-CM

## 2022-09-09 DIAGNOSIS — Z8261 Family history of arthritis: Secondary | ICD-10-CM

## 2022-09-09 DIAGNOSIS — R5383 Other fatigue: Secondary | ICD-10-CM | POA: Diagnosis not present

## 2022-09-09 DIAGNOSIS — G8929 Other chronic pain: Secondary | ICD-10-CM

## 2022-09-09 DIAGNOSIS — Z79899 Other long term (current) drug therapy: Secondary | ICD-10-CM | POA: Diagnosis not present

## 2022-09-09 DIAGNOSIS — Z8719 Personal history of other diseases of the digestive system: Secondary | ICD-10-CM

## 2022-09-09 DIAGNOSIS — Z8742 Personal history of other diseases of the female genital tract: Secondary | ICD-10-CM

## 2022-09-09 DIAGNOSIS — M533 Sacrococcygeal disorders, not elsewhere classified: Secondary | ICD-10-CM

## 2022-09-09 DIAGNOSIS — Z87442 Personal history of urinary calculi: Secondary | ICD-10-CM

## 2022-09-09 DIAGNOSIS — M51369 Other intervertebral disc degeneration, lumbar region without mention of lumbar back pain or lower extremity pain: Secondary | ICD-10-CM

## 2022-09-09 DIAGNOSIS — Z8659 Personal history of other mental and behavioral disorders: Secondary | ICD-10-CM

## 2022-09-09 DIAGNOSIS — Z8639 Personal history of other endocrine, nutritional and metabolic disease: Secondary | ICD-10-CM

## 2022-09-09 DIAGNOSIS — M19072 Primary osteoarthritis, left ankle and foot: Secondary | ICD-10-CM

## 2022-09-09 DIAGNOSIS — Z840 Family history of diseases of the skin and subcutaneous tissue: Secondary | ICD-10-CM

## 2022-09-09 NOTE — Patient Instructions (Signed)

## 2022-09-10 ENCOUNTER — Other Ambulatory Visit (HOSPITAL_COMMUNITY): Payer: Self-pay

## 2022-09-10 ENCOUNTER — Telehealth: Payer: Self-pay | Admitting: Pharmacist

## 2022-09-10 DIAGNOSIS — M0609 Rheumatoid arthritis without rheumatoid factor, multiple sites: Secondary | ICD-10-CM

## 2022-09-10 DIAGNOSIS — Z79899 Other long term (current) drug therapy: Secondary | ICD-10-CM

## 2022-09-10 LAB — CBC WITH DIFFERENTIAL/PLATELET
Absolute Monocytes: 870 cells/uL (ref 200–950)
Basophils Absolute: 139 cells/uL (ref 0–200)
Basophils Relative: 1.2 %
Eosinophils Absolute: 499 cells/uL (ref 15–500)
Eosinophils Relative: 4.3 %
HCT: 41 % (ref 35.0–45.0)
Hemoglobin: 13.6 g/dL (ref 11.7–15.5)
Lymphs Abs: 2842 cells/uL (ref 850–3900)
MCH: 31 pg (ref 27.0–33.0)
MCHC: 33.2 g/dL (ref 32.0–36.0)
MCV: 93.4 fL (ref 80.0–100.0)
MPV: 10.2 fL (ref 7.5–12.5)
Monocytes Relative: 7.5 %
Neutro Abs: 7250 cells/uL (ref 1500–7800)
Neutrophils Relative %: 62.5 %
Platelets: 370 10*3/uL (ref 140–400)
RBC: 4.39 10*6/uL (ref 3.80–5.10)
RDW: 14.3 % (ref 11.0–15.0)
Total Lymphocyte: 24.5 %
WBC: 11.6 10*3/uL — ABNORMAL HIGH (ref 3.8–10.8)

## 2022-09-10 LAB — COMPLETE METABOLIC PANEL WITH GFR
AG Ratio: 2 (calc) (ref 1.0–2.5)
ALT: 27 U/L (ref 6–29)
AST: 20 U/L (ref 10–35)
Albumin: 4.5 g/dL (ref 3.6–5.1)
Alkaline phosphatase (APISO): 80 U/L (ref 37–153)
BUN: 20 mg/dL (ref 7–25)
CO2: 25 mmol/L (ref 20–32)
Calcium: 10.2 mg/dL (ref 8.6–10.4)
Chloride: 107 mmol/L (ref 98–110)
Creat: 0.73 mg/dL (ref 0.50–1.05)
Globulin: 2.3 g/dL (calc) (ref 1.9–3.7)
Glucose, Bld: 90 mg/dL (ref 65–99)
Potassium: 4.7 mmol/L (ref 3.5–5.3)
Sodium: 141 mmol/L (ref 135–146)
Total Bilirubin: 0.2 mg/dL (ref 0.2–1.2)
Total Protein: 6.8 g/dL (ref 6.1–8.1)
eGFR: 91 mL/min/{1.73_m2} (ref >=60)

## 2022-09-10 NOTE — Telephone Encounter (Signed)
Received notification from [facility] regarding a prior authorization for RASUVO. Authorization has been APPROVED from 09/10/22 to 02/22/23. Approval letter sent to scan center.  Per test claim, copay for 28 days supply is $64 and for 84 days supply is $128  Patient can fill through [facility]: [phone]   There is no longer any patient assistance program for Rasuvo so patient would have to pay copay through insurance (no RA grants are available either).  Chesley Mires, PharmD, MPH, BCPS, CPP Clinical Pharmacist (Rheumatology and Pulmonology)

## 2022-09-10 NOTE — Progress Notes (Signed)
White cell count is elevated and stable.  CMP is normal.

## 2022-09-10 NOTE — Telephone Encounter (Addendum)
Submitted a Prior Authorization request to [facility] for RASUVO via CoverMyMeds. Will update once we receive a response.  Key: [id number]   ----- Message from CMA Marissa G sent at 09/09/2022 12:13 PM EDT ----- Please apply for Rasuvo per Sherron Ales, PA-C. Patient is currently taking methotrexate tablets. Thanks!

## 2022-09-13 ENCOUNTER — Other Ambulatory Visit (HOSPITAL_COMMUNITY): Payer: Self-pay

## 2022-09-13 ENCOUNTER — Other Ambulatory Visit: Payer: Self-pay | Admitting: *Deleted

## 2022-09-13 MED ORDER — BACLOFEN 10 MG PO TABS: ORAL_TABLET | ORAL | 0 refills | Status: DC

## 2022-09-13 MED ORDER — RASUVO 20 MG/0.4ML ~~LOC~~ SOAJ
20.0000 mg | SUBCUTANEOUS | 0 refills | Status: DC
  Filled 2022-09-13: qty 4.8, 84d supply, fill #0

## 2022-09-13 MED ORDER — TIZANIDINE HCL 4 MG PO TABS: 4.0000 mg | ORAL_TABLET | Freq: Every evening | ORAL | 0 refills | Status: DC | PRN

## 2022-09-13 NOTE — Telephone Encounter (Signed)
Spoke with patient. She stated she wants to fill for 3 months at a time through [facility]. Sending rx in today. Patient will call to schedule shipment.

## 2022-09-13 NOTE — Telephone Encounter (Addendum)
Patient contacted the office and requested a refill on Baclofen and Tizanidine to be sent to [facility].   Last Fill: 08/09/2022  Next Visit: 12/10/2022  Last Visit: 09/09/2022  Dx: Fibromyalgia   Current Dose per office note on 09/09/2022: tizanidine 4 mg at bedtime as needed for muscle spasms.   Okay to refill Tizanidine and Baclofen?

## 2022-09-14 ENCOUNTER — Other Ambulatory Visit (HOSPITAL_COMMUNITY): Payer: Self-pay

## 2022-09-15 ENCOUNTER — Other Ambulatory Visit (HOSPITAL_COMMUNITY): Payer: Self-pay

## 2022-09-15 ENCOUNTER — Other Ambulatory Visit: Payer: Self-pay

## 2022-09-20 IMAGING — DX DG FOOT COMPLETE 3+V*R*
3 series · 3 of 3 positions shown · non-contrast
Comparison: [facility] Lat right foot series 04/09/2020.

CLINICAL DATA: Looking for metallic foreign body in the soft
tissues, first MTP joint level, swelling and redness.

EXAM:
RIGHT FOOT COMPLETE - 3+ VIEW

[foot ap]
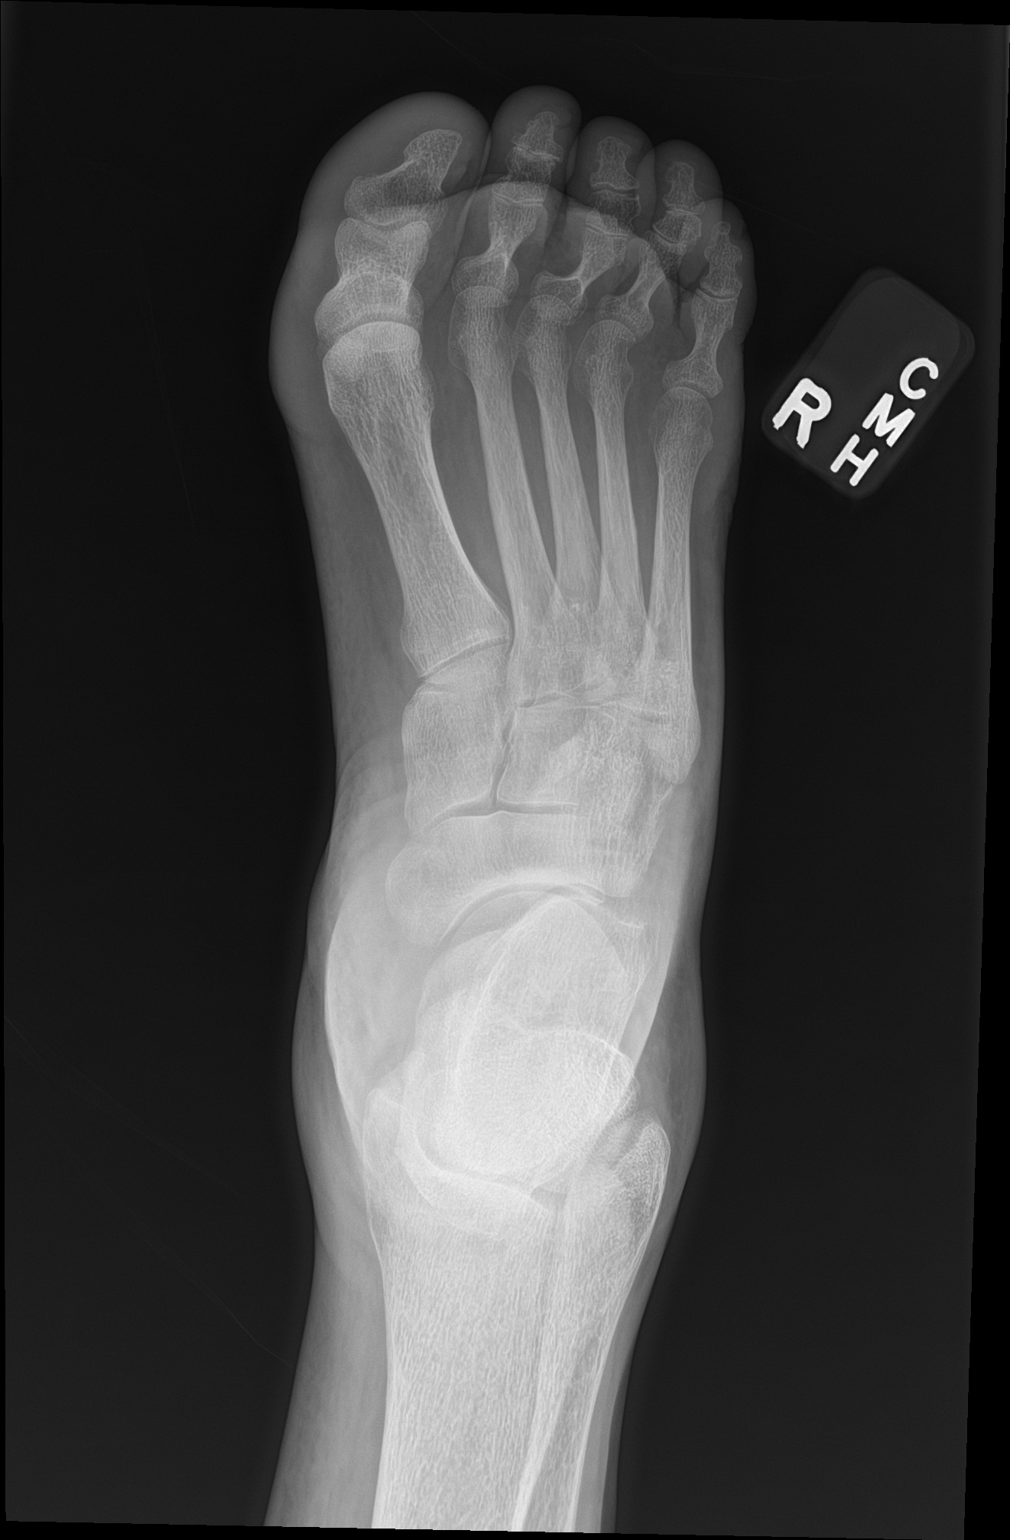

[foot obl]
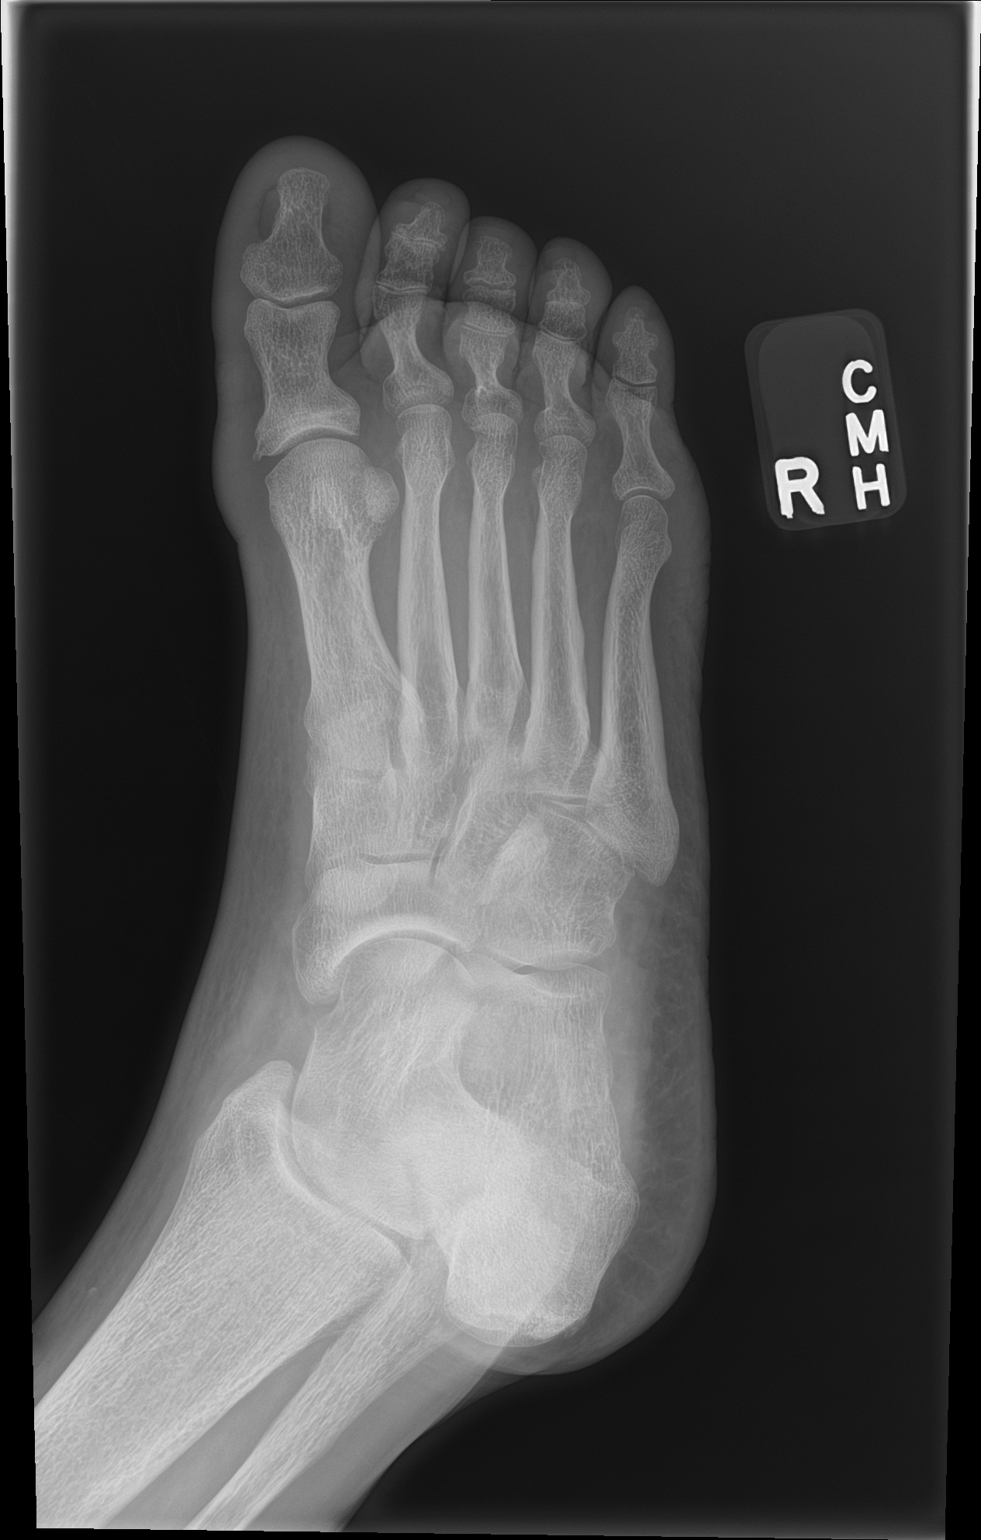

[foot lat]
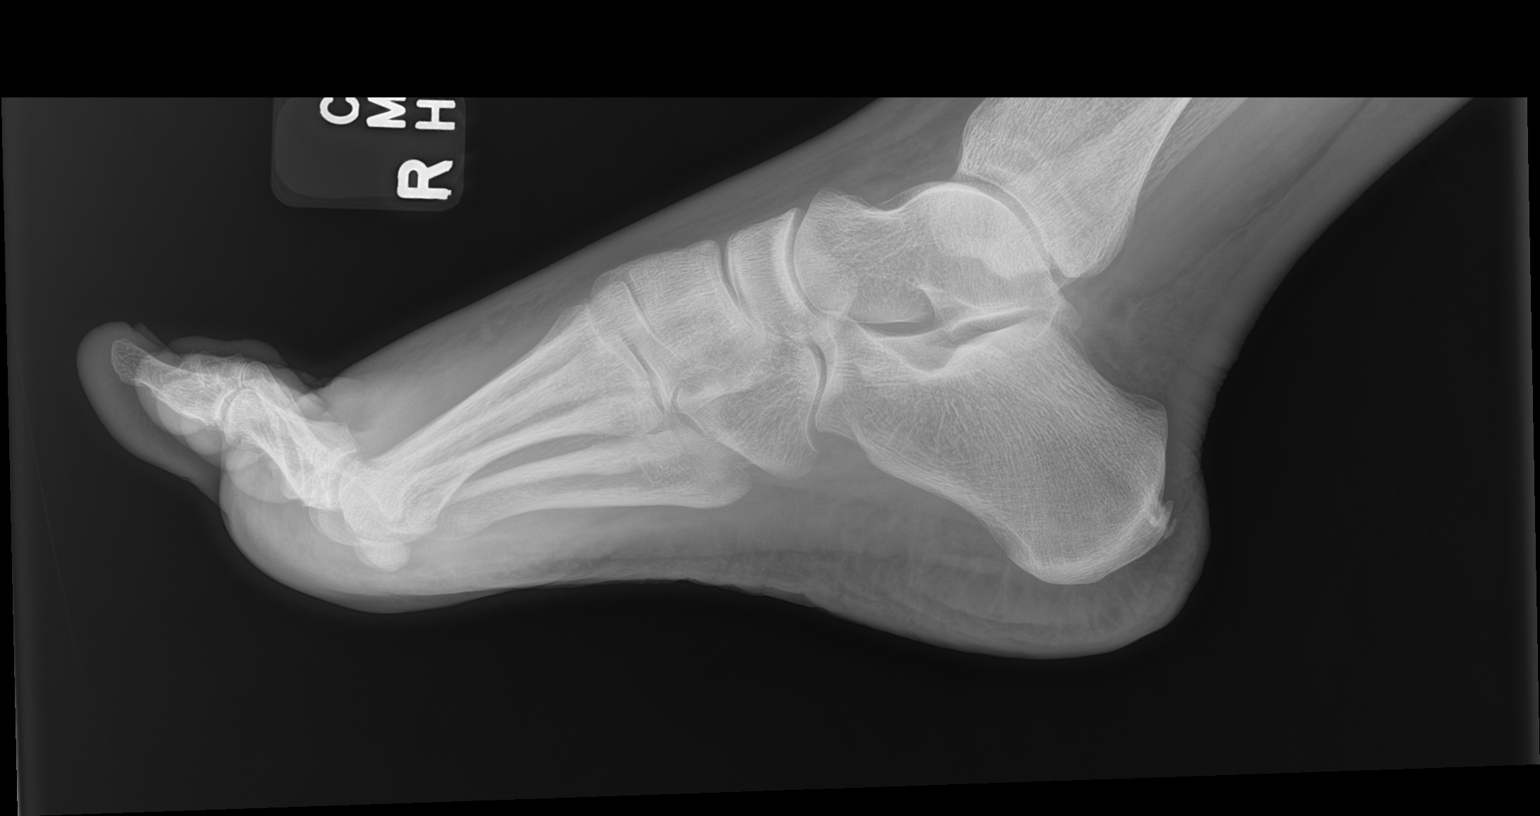

[3 of 3 positions shown; findings below may reference images not displayed]

FINDINGS: There is no evidence of fracture or dislocation. There is no
evidence of arthropathy or other focal bone abnormality. Once again
the MTP joints are hyperextended, which was also seen previously.

There is moderate edema in the midfoot and forefoot, greater
medially without evidence of radiopaque foreign body. Small
posterior calcaneal enthesophyte is again shown.
IMPRESSION: Increased soft tissue edema, but no radiopaque foreign body or
appreciable soft tissue gas. No erosive bone lesion is seen. The
toes are hyperextended which was also noted previously.

## 2022-09-21 ENCOUNTER — Encounter: Payer: Medicare PPO | Admitting: Family Medicine

## 2022-09-21 ENCOUNTER — Ambulatory Visit (HOSPITAL_COMMUNITY): Payer: Medicare PPO

## 2022-09-22 ENCOUNTER — Ambulatory Visit (INDEPENDENT_AMBULATORY_CARE_PROVIDER_SITE_OTHER): Payer: Medicare PPO | Admitting: Otolaryngology

## 2022-09-22 ENCOUNTER — Other Ambulatory Visit (HOSPITAL_COMMUNITY): Payer: Self-pay

## 2022-09-22 ENCOUNTER — Encounter (INDEPENDENT_AMBULATORY_CARE_PROVIDER_SITE_OTHER): Payer: Self-pay | Admitting: Otolaryngology

## 2022-09-22 VITALS — BP 111/72 | HR 65 | Ht 64.0 in | Wt 174.0 lb

## 2022-09-22 DIAGNOSIS — J3089 Other allergic rhinitis: Secondary | ICD-10-CM

## 2022-09-22 DIAGNOSIS — J329 Chronic sinusitis, unspecified: Secondary | ICD-10-CM

## 2022-09-22 DIAGNOSIS — R519 Headache, unspecified: Secondary | ICD-10-CM | POA: Diagnosis not present

## 2022-09-22 DIAGNOSIS — E042 Nontoxic multinodular goiter: Secondary | ICD-10-CM

## 2022-09-22 DIAGNOSIS — R0981 Nasal congestion: Secondary | ICD-10-CM

## 2022-09-22 DIAGNOSIS — H9313 Tinnitus, bilateral: Secondary | ICD-10-CM

## 2022-09-22 DIAGNOSIS — G4733 Obstructive sleep apnea (adult) (pediatric): Secondary | ICD-10-CM

## 2022-09-22 DIAGNOSIS — R0982 Postnasal drip: Secondary | ICD-10-CM | POA: Diagnosis not present

## 2022-09-22 DIAGNOSIS — Z8639 Personal history of other endocrine, nutritional and metabolic disease: Secondary | ICD-10-CM | POA: Diagnosis not present

## 2022-09-22 MED ORDER — DESLORATADINE 5 MG PO TABS
5.0000 mg | ORAL_TABLET | Freq: Every day | ORAL | 3 refills | Status: DC
  Filled 2022-09-22: qty 90, 90d supply, fill #0

## 2022-09-22 MED ORDER — DESLORATADINE 5 MG PO TABS: 5.0000 mg | ORAL_TABLET | Freq: Every day | ORAL | 3 refills | Status: DC

## 2022-09-22 NOTE — Progress Notes (Unsigned)
ENT CONSULT:  Reason for Consult: OSA, nasal congestion and facial pain/pressure/post-nasal drainage/headaches/tinnitus    HPI: Christina Lewis is an 67 y.o. female with hx RA affecting small joints, f.b Rheum on Methotrexate and Plaquenil, hx of OSA, previously on CPAP for many years, however, discontinued CPAP use 2/2 being unable to tolerate the mask, here for multiple complaints.   1)  She is here for nasal congestion, nasal obstruction, and frontal headaches for years, feels nasal drainage and post-nasal drainage. She has decreased sense of smell, and she thinks it happened after covid 2021. Had allergy testing 4 years ago and it was negative. She had allergy shots for 4 yrs when she was a child. On Flonase and Zyrtec. She has hx of migraines, and feels they are under control after starting an infusion medication (new drug for migraines). She has frontal headache on the left when it acts up and with her sinus sx. 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 No new sx   3) hx of OSA and had three CPAP machines (used one for 1.5 yrs) and diagnosed in 2002. She stopped using it due to intoleence and was recently re-established with sleep medicine. Recent sleep study done with [facility] neurology and she is interested in [place] implant.   3) Bilateral tinnitus, water in her ear, worse on the left, and she had ear infections in her ears and chronic sinus all her life, she  4) She reports hx of Esophageal dysmotility/GERD - on Prilosec.    Records Reviewed:  Being seen by Neurology for migraines - last visit with NP [place] 04/08/22 - migraines behind left eye with aura sx   TFTs have always normal has Endocrinologist.  Thyroid U/S 05/06/22  Estimated total number of nodules >/= 1 cm: 2    Number of spongiform nodules >/=  2 cm not described below (TR1): 0   Number of mixed cystic and solid nodules >/= 1.5 cm not described below (TR2): 0   _________________________________________________________   Nodule # 4: Previously biopsied nodule in the left mid gland is unchanged at 1.2 x 1.2 x 1.1 cm.   Nodule # 6: Previously biopsied nodule in the left inferior gland is unchanged at 1.8 x 1.6 x 1.2 cm.   Additional small subcentimeter thyroid nodules again noted scattered throughout the right gland. No interval change. No new nodules or suspicious features.   IMPRESSION: 1. Previously biopsied nodules in the left mid and left lower gland remain unchanged. Greater than 10 years of stability is consistent with benignity. 2. No new nodules or suspicious features.    Past Medical History:  Diagnosis Date   Allergy    Anxiety    Arthritis    Depression    Diabetes mellitus    Fibromyalgia    GERD (gastroesophageal reflux disease)    Gout    Hematuria    History of kidney stones    History of tics    Ineffective esophageal motility    Interstitial cystitis 2016   Migraines    Osteoarthritis    PCOS (polycystic ovarian syndrome) 1993   Pneumonia 2019   PONV (postoperative nausea and vomiting)    Sleep apnea    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, took medication for a  year   Thyroid nodule     Past Surgical History:  Procedure Laterality Date   ABDOMINAL HYSTERECTOMY  2000   APPENDECTOMY  1976   BALLOON DILATION N/A 11/03/2021   Procedure: BALLOON DILATION;  Surgeon: Kerin Salen, MD;  Location: [facility];  Service: Gastroenterology;  Laterality: N/A;   BOTOX INJECTION N/A 11/03/2021   Procedure: BOTOX INJECTION;  Surgeon: Kerin Salen, MD;  Location: [facility];  Service: Gastroenterology;  Laterality: N/A;   BREAST LUMPECTOMY  1989   lt-negative   CARDIOVASCULAR STRESS TEST  2000   CATARACT EXTRACTION W/PHACO Left 12/07/2019    Procedure: CATARACT EXTRACTION PHACO AND INTRAOCULAR LENS PLACEMENT (IOC);  Surgeon: [name], MD;  Location: [facility];  Service: Ophthalmology;  Laterality: Left;  CDE: 5.55   CHOLECYSTECTOMY  1985   COLONOSCOPY     ESOPHAGEAL MANOMETRY N/A 08/24/2021   Procedure: ESOPHAGEAL MANOMETRY (EM);  Surgeon: Kerin Salen, MD;  Location: [facility];  Service: Gastroenterology;  Laterality: N/A;   ESOPHAGOGASTRODUODENOSCOPY N/A 11/03/2021   Procedure: ESOPHAGOGASTRODUODENOSCOPY (EGD);  Surgeon: Kerin Salen, MD;  Location: [facility];  Service: Gastroenterology;  Laterality: N/A;   HYSTERECTOMY ABDOMINAL WITH SALPINGECTOMY Bilateral 2000   KIDNEY STONE SURGERY Right 2014   with stent placement in OR   KNEE ARTHROSCOPY Right 08/22/2013   Procedure: ARTHROSCOPY RIGHT KNEE FOR INFECTION LAVAGE AND DRAINAGE;  Surgeon: Thera Flake., MD;  Location: [facility];  Service: Orthopedics;  Laterality: Right;   KNEE ARTHROSCOPY WITH PATELLA RECONSTRUCTION Right 08/06/2013   Procedure: RIGHT KNEE ARTHROSCOPY WITH MENISCECTOMY MEDIAL, ARTHROSCOPY KNEE WITH DEBRIDEMENT/SHAVING (CHONDROPLASTY) ;  Surgeon: Thera Flake., MD;  Location: [facility];  Service: Orthopedics;  Laterality: Right;   LUMBAR LAMINECTOMY/DECOMPRESSION MICRODISCECTOMY Right 09/11/2019   Procedure: Right Lumbar Three-Four Microdiscectomy;  Surgeon: Maeola Harman, MD;  Location: [facility];  Service: Neurosurgery;  Laterality: Right;  Right Lumbar Three-Four Microdiscectomy   NASAL SINUS SURGERY     x4   OOPHORECTOMY     SHOULDER SURGERY  2011   right shoulder   THYROID SURGERY  1994   TONSILLECTOMY Bilateral 1974    Family History  Problem Relation Age of Onset   Fibromyalgia Mother    Psoriasis Mother        psoriatic arthritis    Diabetes Mother    Heart disease Mother    Psoriasis Sister        psoriatic arthritis    Diabetes Sister    Migraines Sister    Rheum arthritis Sister    Fibromyalgia Sister     Diabetes Sister     Social History:  reports that she has never smoked. She has been exposed to tobacco smoke. She has never used smokeless tobacco. She reports that she does not drink alcohol and does not use drugs.  Allergies:  Allergies  Allergen Reactions   Sulfa Antibiotics Anaphylaxis   Atorvastatin Other (See Comments)   Codeine Itching   Erythromycin Nausea And Vomiting   Metronidazole Diarrhea   Morphine And Codeine Itching   Semaglutide Nausea And Vomiting   Sulfamethoxazole-Trimethoprim Other (See Comments)    Medications: I have reviewed the patient's current medications.  The PMH, PSH, Medications, Allergies, and SH were reviewed and updated.  ROS: Constitutional: Negative for fever, weight loss and weight gain. Cardiovascular: Negative for chest pain and dyspnea on exertion. Respiratory: Is not experiencing shortness of breath at rest. Gastrointestinal: Negative for nausea and vomiting. Neurological: Negative for headaches. Psychiatric: The patient  is not nervous/anxious  Blood pressure 111/72, pulse 65, height 5\' 4"  (1.626 m), weight 174 lb (78.9 kg), SpO2 96%.  PHYSICAL EXAM:  Exam: General: Well-developed, well-nourished Respiratory Respiratory effort: Equal inspiration and expiration without stridor Cardiovascular Peripheral Vascular: Warm extremities with equal color/perfusion Eyes: No nystagmus with equal extraocular motion bilaterally Neuro/Psych/Balance: Patient oriented to person, place, and time; Appropriate mood and affect; Gait is intact with no imbalance; Cranial nerves I-XII are intact Head and Face Inspection: Normocephalic and atraumatic without mass or lesion Palpation: Facial skeleton intact without bony stepoffs Salivary Glands: No mass or tenderness Facial Strength: Facial motility symmetric and full bilaterally ENT Pinna: External ear intact and fully developed External canal: Canal is patent with intact skin Tympanic Membrane:  Clear and mobile External Nose: No scar or anatomic deformity Internal Nose: Septum is straight. No polyp, or purulence. Mucosal edema and erythema present.  Bilateral inferior turbinate hypertrophy.  Lips, Teeth, and gums: Mucosa and teeth intact and viable TMJ: No pain to palpation with full mobility Oral cavity/oropharynx: No erythema or exudate, no lesions present Nasopharynx: No mass or lesion with intact mucosa Neck Neck and Trachea: Midline trachea without mass or lesion Thyroid: No mass or nodularity Lymphatics: No lymphadenopathy  Procedure:     PROCEDURE NOTE: nasal endoscopy  Preoperative diagnosis: chronic sinusitis symptoms  Postoperative diagnosis: same  Procedure: Diagnostic nasal endoscopy (82956)  Surgeon: Ashok Croon, M.D.  Anesthesia: Topical lidocaine and Afrin  H&P REVIEW: The patient's history and physical were reviewed today prior to procedure. All medications were reviewed and updated as well. Complications: None Condition is stable throughout exam Indications and consent: The patient presents with symptoms of chronic sinusitis not responding to previous therapies. All the risks, benefits, and potential complications were reviewed with the patient preoperatively and informed consent was obtained. The time out was completed with confirmation of the correct procedure.   Procedure: The patient was seated upright in the clinic. Topical lidocaine and Afrin were applied to the nasal cavity. After adequate anesthesia had occurred, the rigid nasal endoscope was passed into the nasal cavity. The nasal mucosa, turbinates, septum, and sinus drainage pathways were visualized bilaterally. This revealed prior sinus surgery and no purulence or significant secretions that might be cultured. There were no polyps or sites of significant inflammation. The mucosa was intact and there was no crusting present. The scope was then slowly withdrawn and the patient tolerated the  procedure well. There were no complications or blood loss.      Studies Reviewed: Sleep Study 05/03/2022 done in-lab at [facility]  67 year old female with an underlying medical history of migraine headaches, allergies, arthritis, headache, depression, diabetes, fibromyalgia, reflux disease, gout, kidney stones, interstitial cystitis, PCOS, thyroid nodule, and overweight state, who was previously diagnosed with obstructive sleep apnea. She has not been on PAP therapy for several years 2/2 intolerance. Her original sleep apnea diagnosis was in the 90s and per patient report she was severe at the time. Her Epworth sleepiness score is 5/24, fatigue severity score is 63 out of 63.  Height: 64 in Weight: 174 (BMI 29) Neck Size: 16 in   RESPIRATORY MONITORING:   Based on CMS criteria (using a 4% oxygen desaturation rule for scoring hypopneas), there were 39 apneas (39 obstructive; 0 central; 0 mixed), and 70 hypopneas.  Apnea index was 6.7. Hypopnea index was 12.1. The apnea-hypopnea index was 18.8/hour overall (n/s supine, 75 non-supine; 75.1/hour during REM sleep, 0.0 supine REM).  There were 0 respiratory effort-related arousals (  RERAs).  The RERA index was 0 events/h. Total respiratory disturbance index (RDI) was 18.8 events/h. RDI results showed: supine RDI  0.0 /h; non-supine RDI 18.8 /h; REM RDI 75.1 /h, supine REM RDI 0.0 /h.   Based on AASM criteria (using a 3% oxygen desaturation and /or arousal rule for scoring hypopneas), there were 39 apneas (39 obstructive; 0 central; 0 mixed), and 71 hypopneas. Apnea index was 6.7. Hypopnea index was 12.3. The apnea-hypopnea index was 19.0 overall (0.0 supine, 75 non-supine; 75.1 REM, 0.0 supine REM).  There were 0 respiratory effort-related arousals (RERAs).  The RERA index was 0 events/h. Total respiratory disturbance index (RDI) was 19.0 events/h. RDI results showed: supine RDI  0.0 /h; non-supine RDI 19.0 /h; REM RDI 75.1 /h, supine REM RDI 0.0 /h.     OXIMETRY: Oxyhemoglobin Saturation Nadir during sleep was at  72% from a mean of 94%.  Of the Total sleep time (TST)   hypoxemia (=<88%) was present for  14.9 minutes, or 4.3% of total sleep time.    LIMB MOVEMENTS: There were 0 periodic limb movements of sleep (0.0/hr), of which 0 (0.0/hr) were associated with an arousal.   AROUSAL: There were 45 arousals in total, for an arousal index of 8 arousals/hour.  Of these, 11 were identified as respiratory-related arousals (2 /h), 0 were PLM-related arousals (0 /h), and 50 were non-specific arousals (9 /h).   EEG: Review of the EEG showed no abnormal electrical discharges and symmetrical bihemispheric findings.     EKG: The EKG revealed normal sinus rhythm (NSR). The average heart rate during sleep was 70 bpm.    AUDIO/VIDEO REVIEW: The audio and video review did not show any abnormal or unusual behaviors, movements, phonations or vocalizations. The patient took no restroom breaks. Snoring was noted throughout the night, ranging from milder to louder.   POST-STUDY QUESTIONNAIRE: Post study, the patient indicated, that sleep was worse than usual   IMPRESSION:  1. Obstructive Sleep Apnea (OSA) 2. Dysfunctions associated with sleep stages or arousal from sleep   RECOMMENDATIONS:  1. This study demonstrates moderate to severe obstructive sleep apnea, with a total AHI of 18.8/hour, and O2 nadir of 72% (during non-supine REM sleep).  Of note, the absence of supine sleep during the study likely underestimates her sleep disordered breathing.  Treatment with positive airway pressure in the form of CPAP is recommended. The patient will be advised to proceed with home AutoPap therapy for now.  A full night laboratory attended CPAP titration study can be considered to optimize therapy settings, mask fit, monitoring of tolerance and of proper oxygen saturations down the road. Other treatment options may be limited, and may include (generally speaking) surgical  options in selected patients or the use of an oral appliance in certain patients. Concomitant weight loss is recommended (where clinically appropriate). Please note that untreated obstructive sleep apnea may carry additional perioperative morbidity. Patients with significant obstructive sleep apnea should receive perioperative PAP therapy and the surgeons and particularly the anesthesiologist should be informed of the diagnosis and the severity of the sleep disordered breathing. 2. This study shows sleep fragmentation and abnormal sleep stage percentages; these are nonspecific findings and per se do not signify an intrinsic sleep disorder or a cause for the patient's sleep-related symptoms. Causes include (but are not limited to) the first night effect of the sleep study, circadian rhythm disturbances, medication effect or an underlying mood disorder or medical problem.  3. The patient should be cautioned not to  drive, work at heights, or operate dangerous or heavy equipment when tired or sleepy. Review and reiteration of good sleep hygiene measures should be pursued with any patient. 4. The patient will be seen in follow-up in the sleep clinic at [facility] for discussion of the test results, symptom and treatment compliance review, further management strategies, etc. The patient and her referring provider will be notified of the test results.        Assessment/Plan: Encounter Diagnoses  Name Primary?   Chronic sinusitis, unspecified location Yes   Tinnitus of both ears    Obstructive sleep apnea    Nonintractable episodic headache, unspecified headache type    Nasal congestion    Post-nasal drip    Environmental and seasonal allergies     OSA dx many years ago and CPAP intolerance/non-compliance due to discomfort despite of trial of various masks, I reviewed her most recent study from 3/204 with [facility] and based on her recent BMI and sleep study results, she is a candidate for inspire therapy.  I  discussed risks benefits and postoperative care for the procedure and she would like to proceed with the final stage of the workup for inspire implant.  Will work on scheduling her for drug-induced sleep endoscopy Nasal congestion chronic headaches concern for chronic sinusitis in the setting of allergies and history of sinus surgery in the past -nasal endoscopy today with evidence of nasal mucosal edema and prior FESS, no purulence or polyps, unable to fully visualize sinuses, will obtain sinus CT to evaluate Bilateral tinnitus described as ocean sounds, DDx age-related hearing loss versus eustachian tube dysfunction -will obtain audiogram Thyroid nodules -she will continue to follow-up with serial thyroid ultrasounds ordered by her PCP.  Prior workup including FNA not concerning for atypical cells and normal TFTs in the past.   - schedule hearing test - start nasal saline rinses Lloyd Huger Med bottle)  - stop Zyrtec, start Clarinex, continue Flonase or if start steroid nasal rinses, ok to stop Flonase  - schedule CT sinuses  - work on scheduling of the sleep endoscopy (DISE) - return after testing  Patient Instructions:  We will fax over an order for steroid carpule shipment to you directly to the compounding pharmacy in [place], [place]. They will call you to quote you the price and if acceptable, you can order it to add to the nasal saline rinses   NeilMed saline irrigation bottle can be found/purchased at the following website (or at any drug store):  https://shop.neilmed.com/products/sinus-rinse-starter-kit-[email or web address]-5-packets  Please search for YouTube videos to show you how to irrigate with saline (google sinus irrigation and filter for videos)   Thank you for allowing me to participate in the care of this patient. Please do not hesitate to contact me with any questions or conc erns.   Ashok Croon, MD Otolaryngology [facility] Phone: [phone] Fax:  [phone]    09/23/2022, 8:27 PM

## 2022-09-22 NOTE — Patient Instructions (Addendum)
-   schedule hearing test - start nasal saline rinses [employer]er Med bottle)  - stop Zyrtec, start Clarinex, continue Flonase or if start steroid nasal rinses, ok to stop Flonase  - schedule CT sinuses  - work on scheduling of the sleep endoscopy   We will fax over an order for steroid carpule shipment to you directly to the compounding pharmacy in [place], [place]. They will call you to quote you the price and if acceptable, you can order it to add to the nasal saline rinses   NeilMed saline irrigation bottle can be found/purchased at the following website (or at any drug store):  https://shop.neilmed.com/products/sinus-rinse-starter-kit-[email or web address]-5-packets  Please search for YouTube videos to show you how to irrigate with saline (google sinus irrigation and filter for videos)

## 2022-09-24 ENCOUNTER — Encounter (HOSPITAL_BASED_OUTPATIENT_CLINIC_OR_DEPARTMENT_OTHER): Payer: Self-pay

## 2022-09-27 ENCOUNTER — Other Ambulatory Visit: Payer: Self-pay | Admitting: *Deleted

## 2022-09-27 DIAGNOSIS — M0609 Rheumatoid arthritis without rheumatoid factor, multiple sites: Secondary | ICD-10-CM

## 2022-09-27 MED ORDER — HYDROXYCHLOROQUINE SULFATE 200 MG PO TABS
ORAL_TABLET | ORAL | 0 refills | Status: DC
Start: 2022-09-27 — End: 2022-12-27

## 2022-09-27 NOTE — Telephone Encounter (Signed)
Patient contacted the office to request a medication refill.   1. Name of Medication: Plaquenil  2. How are you currently taking this medication (dosage and times per day)? 1 TABLET TWICE DAILY MONDAY THRU FRIDAY    3. What pharmacy would you like for that to be sent to? [facility]

## 2022-09-27 NOTE — Telephone Encounter (Signed)
Last Fill: 06/04/2022  Eye exam: 10/07/2021 WNL    Labs: 09/09/2022 White cell count is elevated and stable.  CMP is normal.   Next Visit: 12/10/2022  Last Visit: 09/09/2022  DX: Rheumatoid arthritis of multiple sites with negative rheumatoid factor   Current Dose per office note 09/09/2022: Plaquenil 200 mg 1 tablet by mouth twice daily Monday through Friday.   Patient has PL[facility] eye exam scheduled for 10/22/2022.  Okay to refill Plaquenil?

## 2022-09-28 ENCOUNTER — Encounter: Payer: Medicare PPO | Admitting: Family Medicine

## 2022-09-28 ENCOUNTER — Telehealth: Payer: Self-pay | Admitting: *Deleted

## 2022-09-28 NOTE — Telephone Encounter (Signed)
Notified patient of change to sx time of 1p  Christina Lewis @ OR gave the OK for her to arrive at 27. Notified provider as well.

## 2022-09-29 ENCOUNTER — Other Ambulatory Visit: Payer: Self-pay

## 2022-09-29 ENCOUNTER — Ambulatory Visit (HOSPITAL_BASED_OUTPATIENT_CLINIC_OR_DEPARTMENT_OTHER)
Admission: RE | Admit: 2022-09-29 | Discharge: 2022-09-29 | Disposition: A | Discharge status: Home / Self Care | Payer: Medicare PPO | Attending: Otolaryngology | Admitting: Otolaryngology

## 2022-09-29 ENCOUNTER — Ambulatory Visit (HOSPITAL_BASED_OUTPATIENT_CLINIC_OR_DEPARTMENT_OTHER): Payer: Medicare PPO | Admitting: Anesthesiology

## 2022-09-29 ENCOUNTER — Encounter (HOSPITAL_BASED_OUTPATIENT_CLINIC_OR_DEPARTMENT_OTHER): Payer: Self-pay

## 2022-09-29 ENCOUNTER — Encounter (HOSPITAL_BASED_OUTPATIENT_CLINIC_OR_DEPARTMENT_OTHER)
Admission: RE | Disposition: A | Discharge status: Home / Self Care | Payer: Self-pay | Source: Home / Self Care | Attending: Otolaryngology

## 2022-09-29 DIAGNOSIS — G4733 Obstructive sleep apnea (adult) (pediatric): Secondary | ICD-10-CM

## 2022-09-29 DIAGNOSIS — Z7984 Long term (current) use of oral hypoglycemic drugs: Secondary | ICD-10-CM

## 2022-09-29 DIAGNOSIS — E119 Type 2 diabetes mellitus without complications: Secondary | ICD-10-CM | POA: Diagnosis not present

## 2022-09-29 DIAGNOSIS — E1169 Type 2 diabetes mellitus with other specified complication: Secondary | ICD-10-CM

## 2022-09-29 DIAGNOSIS — K219 Gastro-esophageal reflux disease without esophagitis: Secondary | ICD-10-CM | POA: Diagnosis not present

## 2022-09-29 DIAGNOSIS — E1165 Type 2 diabetes mellitus with hyperglycemia: Secondary | ICD-10-CM | POA: Diagnosis not present

## 2022-09-29 DIAGNOSIS — E785 Hyperlipidemia, unspecified: Secondary | ICD-10-CM | POA: Diagnosis not present

## 2022-09-29 DIAGNOSIS — Z01818 Encounter for other preprocedural examination: Secondary | ICD-10-CM

## 2022-09-29 DIAGNOSIS — M797 Fibromyalgia: Secondary | ICD-10-CM | POA: Diagnosis not present

## 2022-09-29 HISTORY — PX: DRUG INDUCED ENDOSCOPY: SHX6808

## 2022-09-29 LAB — GLUCOSE, CAPILLARY
Glucose-Capillary: 122 mg/dL — ABNORMAL HIGH (ref 70–99)
Glucose-Capillary: 104 mg/dL — ABNORMAL HIGH (ref 70–99)

## 2022-09-29 SURGERY — DRUG INDUCED SLEEP ENDOSCOPY
Anesthesia: Monitor Anesthesia Care | Site: Throat | Laterality: Bilateral

## 2022-09-29 MED ORDER — PROPOFOL 10 MG/ML IV BOLUS: INTRAVENOUS | Status: DC | PRN

## 2022-09-29 MED ORDER — FENTANYL CITRATE (PF) 100 MCG/2ML IJ SOLN: 25.0000 ug | INTRAMUSCULAR | Status: DC | PRN

## 2022-09-29 MED ORDER — LIDOCAINE-EPINEPHRINE 1 %-1:100000 IJ SOLN
INTRAMUSCULAR | Status: AC
  Filled 2022-09-29: qty 1

## 2022-09-29 MED ORDER — LACTATED RINGERS IV SOLN: INTRAVENOUS | Status: DC

## 2022-09-29 MED ORDER — OXYMETAZOLINE HCL 0.05 % NA SOLN
NASAL | Status: AC
  Filled 2022-09-29: qty 30

## 2022-09-29 MED ORDER — PROPOFOL 500 MG/50ML IV EMUL
INTRAVENOUS | Status: DC | PRN
  Administered 2022-09-29: 100 ug/kg/min via INTRAVENOUS

## 2022-09-29 MED ORDER — LIDOCAINE HCL (CARDIAC) PF 100 MG/5ML IV SOSY
PREFILLED_SYRINGE | INTRAVENOUS | Status: DC | PRN
  Administered 2022-09-29: 50 mg via INTRAVENOUS

## 2022-09-29 MED ORDER — PROPOFOL 10 MG/ML IV BOLUS
INTRAVENOUS | Status: DC | PRN
  Administered 2022-09-29: 10 mg via INTRAVENOUS

## 2022-09-29 SURGICAL SUPPLY — 14 items
BRONCHOSCOPE PED SLIM DISP (MISCELLANEOUS) IMPLANT
CANISTER SUCT 1200ML W/VALVE (MISCELLANEOUS) IMPLANT
GLOVE BIO SURGEON STRL SZ 6.5 (GLOVE) ×1 IMPLANT
GLOVE BIO SURGEON STRL SZ7 (GLOVE) IMPLANT
KIT CLEAN ENDO (MISCELLANEOUS) ×1 IMPLANT
NDL HYPO 27GX1-1/4 (NEEDLE) IMPLANT
NEEDLE HYPO 27GX1-1/4 (NEEDLE) IMPLANT
PATTIES SURGICAL .5 X3 (DISPOSABLE) IMPLANT
SHEET MEDIUM DRAPE 40X70 STRL (DRAPES) ×1 IMPLANT
SOL ANTI FOG 6CC (MISCELLANEOUS) ×1 IMPLANT
SURGILUBE 2OZ TUBE FLIPTOP (MISCELLANEOUS) ×1 IMPLANT
SYR CONTROL 10ML LL (SYRINGE) IMPLANT
TOWEL GREEN STERILE FF (TOWEL DISPOSABLE) ×1 IMPLANT
TUBE CONNECTING 20X1/4 (TUBING) IMPLANT

## 2022-09-29 NOTE — Op Note (Signed)
Operative note  Preoperative diagnosis: OSA Postoperative diagnosis:   Same  Procedure:DISE ( Drug induced sleep endoscopy)  Anesthesia: Topical lidocaine gel Complications: None Condition is stable throughout exam  Indications and consent:   The patient presents to my otolaryngology clinic with a chief complaint of OSA  Because of pt's OSA and desire to be a candidate for Inspire therapy, it was recommended that they undergo a flexible fiberoptic laryngoscopy under sedation (DISE).   All the risks, benefits, and potential complications were reviewed with the patient preoperatively and informed consent was obtained.  Procedure: Pt was brought back to the OR and laid supine on the table. Propofol anesthesia was administered per protocol until pt reached optimal level of sedation. DISE exam showed the following anatomic collapse pattern.  VOTE classification Velopharynx- 2 A-P Oropharynx- 1 Tongue base- 2 Epiglottis- 1   Based on the DISE findings, pt is a candidate for Hypoglossal nerve stimulation (Inspire therapy) based on the above anatomy.

## 2022-09-29 NOTE — Anesthesia Preprocedure Evaluation (Addendum)
Anesthesia Evaluation  Patient identified by MRN, date of birth, ID band Patient awake    Reviewed: Allergy & Precautions, NPO status , Patient's Chart, lab work & pertinent test results  History of Anesthesia Complications (+) PONV and history of anesthetic complications  Airway Mallampati: I  TM Distance: >3 FB Neck ROM: Full    Dental  (+) Chipped, Dental Advisory Given,    Pulmonary sleep apnea (no CPAP)    Pulmonary exam normal breath sounds clear to auscultation       Cardiovascular negative cardio ROS Normal cardiovascular exam Rhythm:Regular Rate:Normal     Neuro/Psych  Headaches PSYCHIATRIC DISORDERS Anxiety Depression       GI/Hepatic Neg liver ROS,GERD  Controlled,,  Endo/Other  diabetes, Type 2, Oral Hypoglycemic Agents  PCOS  Renal/GU negative Renal ROS  negative genitourinary   Musculoskeletal  (+) Arthritis ,  Fibromyalgia -  Abdominal   Peds  Hematology negative hematology ROS (+)   Anesthesia Other Findings   Reproductive/Obstetrics                             Anesthesia Physical Anesthesia Plan  ASA: 3  Anesthesia Plan: MAC   Post-op Pain Management:    Induction: Intravenous  PONV Risk Score and Plan: 3 and Propofol infusion and Treatment may vary due to age or medical condition  Airway Management Planned: Natural Airway  Additional Equipment:   Intra-op Plan:   Post-operative Plan:   Informed Consent: I have reviewed the patients History and Physical, chart, labs and discussed the procedure including the risks, benefits and alternatives for the proposed anesthesia with the patient or authorized representative who has indicated his/her understanding and acceptance.     Dental advisory given  Plan Discussed with: CRNA  Anesthesia Plan Comments:        Anesthesia Quick Evaluation

## 2022-09-29 NOTE — Transfer of Care (Signed)
Immediate Anesthesia Transfer of Care Note  Patient: Christina Lewis  Procedure(s) Performed: DRUG INDUCED ENDOSCOPY (Bilateral: Throat)  Patient Location: PACU  Anesthesia Type:MAC  Level of Consciousness: awake, alert , and oriented  Airway & Oxygen Therapy: Patient Spontanous Breathing and Patient connected to nasal cannula oxygen  Post-op Assessment: Report given to RN and Post -op Vital signs reviewed and stable  Post vital signs: Reviewed and stable  Last Vitals:  Vitals Value Taken Time  BP    Temp    Pulse 67 09/29/22 1324  Resp 15 09/29/22 1324  SpO2 94 % 09/29/22 1324  Vitals shown include unfiled device data.  Last Pain:  Vitals:   09/29/22 1235  TempSrc: Oral  PainSc: 0-No pain         Complications: No notable events documented.

## 2022-09-29 NOTE — H&P (Signed)
History and Physical Note  Reason for Consult: OSA, nasal congestion and facial pain/pressure/post-nasal drainage/headaches/tinnitus     HPI: Christina Lewis is an 67 y.o. female with hx RA affecting small joints, f.b Rheum on Methotrexate and Plaquenil, hx of OSA, previously on CPAP for many years, however, discontinued CPAP use 2/2 being unable to tolerate the mask, here for multiple complaints.    1)  She is here for nasal congestion, nasal obstruction, and frontal headaches for years, feels nasal drainage and post-nasal drainage. She has decreased sense of smell, and she thinks it happened after covid 2021. Had allergy testing 4 years ago and it was negative. She had allergy shots for 4 yrs when she was a child. On Flonase and Zyrtec. She has hx of migraines, and feels they are under control after starting an infusion medication (new drug for migraines). She has frontal headache on the left when it acts up and with her sinus sx. 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 No new sx    3) hx of OSA and had three CPAP machines (used one for 1.5 yrs) and diagnosed in 2002. She stopped using it due to intoleence and was recently re-established with sleep medicine. Recent sleep study done with [facility] neurology and she is interested in Golf implant.    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      Records Reviewed:  Being seen by Neurology for migraines - last visit with NP [place] 04/08/22 - migraines behind left eye with aura sx    TFTs have always normal has Endocrinologist.  Thyroid U/S 05/06/22  Estimated total number of  nodules >/= 1 cm: 2   Number of spongiform nodules >/=  2 cm not described below (TR1): 0   Number of mixed cystic and solid nodules >/= 1.5 cm not described below (TR2): 0   _________________________________________________________   Nodule # 4: Previously biopsied nodule in the left mid gland is unchanged at 1.2 x 1.2 x 1.1 cm.   Nodule # 6: Previously biopsied nodule in the left inferior gland is unchanged at 1.8 x 1.6 x 1.2 cm.   Additional small subcentimeter thyroid nodules again noted scattered throughout the right gland. No interval change. No new nodules or suspicious features.   IMPRESSION: 1. Previously biopsied nodules in the left mid and left lower gland remain unchanged. Greater than 10 years of stability is consistent with benignity. 2. No new nodules or suspicious features.         Past Medical History:  Diagnosis Date   Allergy     Anxiety     Arthritis     Depression     Diabetes mellitus     Fibromyalgia     GERD (gastroesophageal reflux disease)     Gout     Hematuria     History of kidney stones     History of tics     Ineffective esophageal motility     Interstitial cystitis 2016   Migraines     Osteoarthritis     PCOS (polycystic ovarian syndrome) 1993   Pneumonia  2019   PONV (postoperative nausea and vomiting)     Sleep apnea     TB (pulmonary tuberculosis)      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, took medication for a year   Thyroid nodule                 Past Surgical History:  Procedure Laterality Date   ABDOMINAL HYSTERECTOMY   2000   APPENDECTOMY   1976   BALLOON DILATION N/A 11/03/2021    Procedure: BALLOON DILATION;  Surgeon: Kerin Salen, MD;  Location: [facility];  Service: Gastroenterology;  Laterality: N/A;   BOTOX INJECTION N/A 11/03/2021    Procedure: BOTOX INJECTION;  Surgeon: Kerin Salen, MD;  Location: [facility];  Service: Gastroenterology;  Laterality: N/A;   BREAST LUMPECTOMY   1989    lt-negative   CARDIOVASCULAR  STRESS TEST   2000   CATARACT EXTRACTION W/PHACO Left 12/07/2019    Procedure: CATARACT EXTRACTION PHACO AND INTRAOCULAR LENS PLACEMENT (IOC);  Surgeon: [name], MD;  Location: [facility];  Service: Ophthalmology;  Laterality: Left;  CDE: 5.55   CHOLECYSTECTOMY   1985   COLONOSCOPY       ESOPHAGEAL MANOMETRY N/A 08/24/2021    Procedure: ESOPHAGEAL MANOMETRY (EM);  Surgeon: Kerin Salen, MD;  Location: [facility];  Service: Gastroenterology;  Laterality: N/A;   ESOPHAGOGASTRODUODENOSCOPY N/A 11/03/2021    Procedure: ESOPHAGOGASTRODUODENOSCOPY (EGD);  Surgeon: Kerin Salen, MD;  Location: [facility];  Service: Gastroenterology;  Laterality: N/A;   HYSTERECTOMY ABDOMINAL WITH SALPINGECTOMY Bilateral 2000   KIDNEY STONE SURGERY Right 2014    with stent placement in OR   KNEE ARTHROSCOPY Right 08/22/2013    Procedure: ARTHROSCOPY RIGHT KNEE FOR INFECTION LAVAGE AND DRAINAGE;  Surgeon: Thera Flake., MD;  Location: [facility];  Service: Orthopedics;  Laterality: Right;   KNEE ARTHROSCOPY WITH PATELLA RECONSTRUCTION Right 08/06/2013    Procedure: RIGHT KNEE ARTHROSCOPY WITH MENISCECTOMY MEDIAL, ARTHROSCOPY KNEE WITH DEBRIDEMENT/SHAVING (CHONDROPLASTY) ;  Surgeon: Thera Flake., MD;  Location: [facility];  Service: Orthopedics;  Laterality: Right;   LUMBAR LAMINECTOMY/DECOMPRESSION MICRODISCECTOMY Right 09/11/2019    Procedure: Right Lumbar Three-Four Microdiscectomy;  Surgeon: Maeola Harman, MD;  Location: [facility] OR;  Service: Neurosurgery;  Laterality: Right;  Right Lumbar Three-Four Microdiscectomy   NASAL SINUS SURGERY        x4   OOPHORECTOMY       SHOULDER SURGERY   2011    right shoulder   THYROID SURGERY   1994   TONSILLECTOMY Bilateral 1974               Family History  Problem Relation Age of Onset   Fibromyalgia Mother     Psoriasis Mother          psoriatic arthritis    Diabetes Mother     Heart disease Mother     Psoriasis Sister           psoriatic arthritis    Diabetes Sister     Migraines Sister     Rheum arthritis Sister     Fibromyalgia Sister     Diabetes Sister            Social History:  reports that she has never smoked. She has been exposed to tobacco smoke. She has never used smokeless tobacco. She reports that she does not drink alcohol and does not use drugs.   Allergies:  Allergies      Allergies  Allergen Reactions  Sulfa Antibiotics Anaphylaxis   Atorvastatin Other (See Comments)   Codeine Itching   Erythromycin Nausea And Vomiting   Metronidazole Diarrhea   Morphine And Codeine Itching   Semaglutide Nausea And Vomiting   Sulfamethoxazole-Trimethoprim Other (See Comments)        Medications: I have reviewed the patient's current medications.   The PMH, PSH, Medications, Allergies, and SH were reviewed and updated.   ROS: Constitutional: Negative for fever, weight loss and weight gain. Cardiovascular: Negative for chest pain and dyspnea on exertion. Respiratory: Is not experiencing shortness of breath at rest. Gastrointestinal: Negative for nausea and vomiting. Neurological: Negative for headaches. Psychiatric: The patient is not nervous/anxious   Blood pressure 111/72, pulse 65, height 5\' 4"  (1.626 m), weight 174 lb (78.9 kg), SpO2 96%.   PHYSICAL EXAM:   Exam: General: Well-developed, well-nourished Respiratory Respiratory effort: Equal inspiration and expiration without stridor Cardiovascular Peripheral Vascular: Warm extremities with equal color/perfusion Eyes: No nystagmus with equal extraocular motion bilaterally Neuro/Psych/Balance: Patient oriented to person, place, and time; Appropriate mood and affect; Gait is intact with no imbalance; Cranial nerves I-XII are intact Head and Face Inspection: Normocephalic and atraumatic without mass or lesion Palpation: Facial skeleton intact without bony stepoffs Salivary Glands: No mass or tenderness Facial Strength: Facial motility  symmetric and full bilaterally ENT Pinna: External ear intact and fully developed External canal: Canal is patent with intact skin Tympanic Membrane: Clear and mobile External Nose: No scar or anatomic deformity Internal Nose: Septum is straight. No polyp, or purulence. Mucosal edema and erythema present.  Bilateral inferior turbinate hypertrophy.  Lips, Teeth, and gums: Mucosa and teeth intact and viable TMJ: No pain to palpation with full mobility Oral cavity/oropharynx: No erythema or exudate, no lesions present Nasopharynx: No mass or lesion with intact mucosa Neck Neck and Trachea: Midline trachea without mass or lesion Thyroid: No mass or nodularity Lymphatics: No lymphadenopathy         Studies Reviewed: Sleep Study 05/03/2022 done in-lab at [facility]  67 year old female with an underlying medical history of migraine headaches, allergies, arthritis, headache, depression, diabetes, fibromyalgia, reflux disease, gout, kidney stones, interstitial cystitis, PCOS, thyroid nodule, and overweight state, who was previously diagnosed with obstructive sleep apnea. She has not been on PAP therapy for several years 2/2 intolerance. Her original sleep apnea diagnosis was in the 90s and per patient report she was severe at the time. Her Epworth sleepiness score is 5/24, fatigue severity score is 63 out of 63.  Height: 64 in Weight: 174 (BMI 29) Neck Size: 16 in    RESPIRATORY MONITORING:   Based on CMS criteria (using a 4% oxygen desaturation rule for scoring hypopneas), there were 39 apneas (39 obstructive; 0 central; 0 mixed), and 70 hypopneas.  Apnea index was 6.7. Hypopnea index was 12.1. The apnea-hypopnea index was 18.8/hour overall (n/s supine, 75 non-supine; 75.1/hour during REM sleep, 0.0 supine REM).  There were 0 respiratory effort-related arousals (RERAs).  The RERA index was 0 events/h. Total respiratory disturbance index (RDI) was 18.8 events/h. RDI results showed: supine RDI   0.0 /h; non-supine RDI 18.8 /h; REM RDI 75.1 /h, supine REM RDI 0.0 /h.   Based on AASM criteria (using a 3% oxygen desaturation and /or arousal rule for scoring hypopneas), there were 39 apneas (39 obstructive; 0 central; 0 mixed), and 71 hypopneas. Apnea index was 6.7. Hypopnea index was 12.3. The apnea-hypopnea index was 19.0 overall (0.0 supine, 75 non-supine; 75.1 REM, 0.0 supine REM).  There were  0 respiratory effort-related arousals (RERAs).  The RERA index was 0 events/h. Total respiratory disturbance index (RDI) was 19.0 events/h. RDI results showed: supine RDI  0.0 /h; non-supine RDI 19.0 /h; REM RDI 75.1 /h, supine REM RDI 0.0 /h.    OXIMETRY: Oxyhemoglobin Saturation Nadir during sleep was at  72% from a mean of 94%.  Of the Total sleep time (TST)   hypoxemia (=<88%) was present for  14.9 minutes, or 4.3% of total sleep time.    LIMB MOVEMENTS: There were 0 periodic limb movements of sleep (0.0/hr), of which 0 (0.0/hr) were associated with an arousal.   AROUSAL: There were 45 arousals in total, for an arousal index of 8 arousals/hour.  Of these, 11 were identified as respiratory-related arousals (2 /h), 0 were PLM-related arousals (0 /h), and 50 were non-specific arousals (9 /h).   EEG: Review of the EEG showed no abnormal electrical discharges and symmetrical bihemispheric findings.     EKG: The EKG revealed normal sinus rhythm (NSR). The average heart rate during sleep was 70 bpm.    AUDIO/VIDEO REVIEW: The audio and video review did not show any abnormal or unusual behaviors, movements, phonations or vocalizations. The patient took no restroom breaks. Snoring was noted throughout the night, ranging from milder to louder.   POST-STUDY QUESTIONNAIRE: Post study, the patient indicated, that sleep was worse than usual   IMPRESSION:  1. Obstructive Sleep Apnea (OSA) 2. Dysfunctions associated with sleep stages or arousal from sleep   RECOMMENDATIONS:  1. This study demonstrates  moderate to severe obstructive sleep apnea, with a total AHI of 18.8/hour, and O2 nadir of 72% (during non-supine REM sleep).  Of note, the absence of supine sleep during the study likely underestimates her sleep disordered breathing.  Treatment with positive airway pressure in the form of CPAP is recommended. The patient will be advised to proceed with home AutoPap therapy for now.  A full night laboratory attended CPAP titration study can be considered to optimize therapy settings, mask fit, monitoring of tolerance and of proper oxygen saturations down the road. Other treatment options may be limited, and may include (generally speaking) surgical options in selected patients or the use of an oral appliance in certain patients. Concomitant weight loss is recommended (where clinically appropriate). Please note that untreated obstructive sleep apnea may carry additional perioperative morbidity. Patients with significant obstructive sleep apnea should receive perioperative PAP therapy and the surgeons and particularly the anesthesiologist should be informed of the diagnosis and the severity of the sleep disordered breathing. 2. This study shows sleep fragmentation and abnormal sleep stage percentages; these are nonspecific findings and per se do not signify an intrinsic sleep disorder or a cause for the patient's sleep-related symptoms. Causes include (but are not limited to) the first night effect of the sleep study, circadian rhythm disturbances, medication effect or an underlying mood disorder or medical problem.  3. The patient should be cautioned not to drive, work at heights, or operate dangerous or heavy equipment when tired or sleepy. Review and reiteration of good sleep hygiene measures should be pursued with any patient. 4. The patient will be seen in follow-up in the sleep clinic at [facility] for discussion of the test results, symptom and treatment compliance review, further management strategies, etc. The  patient and her referring provider will be notified of the test results.    Assessment and Plan:  She is here today for Drug-Induced Sleep Endoscopy to determine candidacy for Inspire Therapy. No new sx  or medications since last office visit. No new admissions to the hospital or visits to ED since office visit. We discussed risks and benefits of the procedure and she would like to proceed.   - to OR for DISE

## 2022-09-29 NOTE — Discharge Instructions (Addendum)
DRUG-INDUCED SLEEP ENDOSCOPY POST-OPERATIVE INSTRUCTIONS:  Based on the drug-induced sleep endoscopy today, you were deemed a candidate for Inspire Therapy.  Please review post-operative and recovery instructions below that you will need to be aware of after Inspire Implant surgery.  Please restart all of your home medications if you take anything on a daily basis.  You can resume regular diet after this procedure.  You will be scheduled for pre-operative appointment with Dr. Irene Pap to review details about surgery and to discuss the next steps.   DIET: Resume normal diet HYGIENE: Please wait until 48 hours after surgery before getting incisions on neck, chest, and torso wet. In the first 48 hours after surgery, will likely need to take sponge baths. WOUND CARE: Please leave pressure dressing on for 48 hours after surgery. Gently place antibiotic ointment over incisions 2 times per day; use clean q-tip. May place a clean bandage over incisions as needed. After 48 hours, you may get incisions wet with warm soap and water, but do not soak the incisions.  Pat area dry gently.  Immediately place antibiotic ointment. Take oral antibiotics as prescribed If skin around incision starts to get red (> 1cm), swollen, and/or more painful, please call the office ACTIVITY: Try to avoid sleeping on the side of your surgery, to the extent possible.   You may walk for exercise starting the day after surgery. For 2 weeks: Do not pick up anything greater than 5 pounds with the hand/arm that's on the same side as the surgery.  After 2 weeks, you may increase weight to 10 pounds.   Consider performing neck rolls 10 clockwise and 10 counterclockwise 3x/day. For 4 weeks, no strenuous activity (running, jogging, lifting weights, gardening, sports) or until cleared by physician.   PAIN MEDICATIONS: You will be prescribed xxx for pain.   If pain is not severe, consider taking Tylenol 650mg  every 6 hours Avoid  aspirin for 7 days after surgery Avoid direct heat (such as heating pads) to incision sites.   May gently place ice over surgery sites as needed.  Please place a thin clean towel over skin first and then place ice bag over towel.  Ice for 10 minutes at a time only.  POST-OPERATIVE CLINIC APPOINTMENTS: 1 week: suture removal and wound check in the office.  1 month: device activation and wound check in the office. 2.5 months: check in visit to assess usage. 3-4 months: titration sleep study based on usage of >4 hrs/night.  4 months: final wound check in the office.  Yearly: device check at office.  SCAR CARE: After incisions have healed, you will have a scar, which will continue to evolve over the course of 12 months.  Caring for your incision scars will help them to be as minimal as possible. If you are out in the sun with incision exposure, please remember to place sunscreen over the incision and surrounding skin.   You may use vitamin E or "Scar ointment/cream" to help soften scar.  Please wait one month after surgery before starting this.      Post Anesthesia Home Care Instructions  Activity: Get plenty of rest for the remainder of the day. A responsible individual must stay with you for 24 hours following the procedure.  For the next 24 hours, DO NOT: -Drive a car -Advertising copywriter -Drink alcoholic beverages -Take any medication unless instructed by your physician -Make any legal decisions or sign important papers.  Meals: Start with liquid foods such as gelatin or  soup. Progress to regular foods as tolerated. Avoid greasy, spicy, heavy foods. If nausea and/or vomiting occur, drink only clear liquids until the nausea and/or vomiting subsides. Call your physician if vomiting continues.  Special Instructions/Symptoms: Your throat may feel dry or sore from the anesthesia or the breathing tube placed in your throat during surgery. If this causes discomfort, gargle with warm salt  water. The discomfort should disappear within 24 hours.  If you had a scopolamine patch placed behind your ear for the management of post- operative nausea and/or vomiting:  1. The medication in the patch is effective for 72 hours, after which it should be removed.  Wrap patch in a tissue and discard in the trash. Wash hands thoroughly with soap and water. 2. You may remove the patch earlier than 72 hours if you experience unpleasant side effects which may include dry mouth, dizziness or visual disturbances. 3. Avoid touching the patch. Wash your hands with soap and water after contact with the patch.

## 2022-09-30 ENCOUNTER — Encounter (HOSPITAL_BASED_OUTPATIENT_CLINIC_OR_DEPARTMENT_OTHER): Payer: Self-pay | Admitting: Otolaryngology

## 2022-09-30 NOTE — Anesthesia Postprocedure Evaluation (Signed)
Anesthesia Post Note  Patient: Christina Lewis  Procedure(s) Performed: DRUG INDUCED ENDOSCOPY (Bilateral: Throat)     Patient location during evaluation: PACU Anesthesia Type: MAC Level of consciousness: awake and alert Pain management: pain level controlled Vital Signs Assessment: post-procedure vital signs reviewed and stable Respiratory status: spontaneous breathing, nonlabored ventilation, respiratory function stable and patient connected to nasal cannula oxygen Cardiovascular status: stable and blood pressure returned to baseline Postop Assessment: no apparent nausea or vomiting Anesthetic complications: no  No notable events documented.  Last Vitals:  Vitals:   09/29/22 1400 09/29/22 1419  BP: 112/63 121/70  Pulse: 66 72  Resp: 18 18  Temp:  (!) 36.3 C  SpO2: 95% 100%    Last Pain:  Vitals:   09/29/22 1419  TempSrc:   PainSc: 0-No pain                  L 

## 2022-10-01 DIAGNOSIS — M5412 Radiculopathy, cervical region: Secondary | ICD-10-CM | POA: Diagnosis not present

## 2022-10-01 DIAGNOSIS — M4802 Spinal stenosis, cervical region: Secondary | ICD-10-CM | POA: Diagnosis not present

## 2022-10-01 DIAGNOSIS — M5416 Radiculopathy, lumbar region: Secondary | ICD-10-CM | POA: Diagnosis not present

## 2022-10-01 DIAGNOSIS — Z6827 Body mass index (BMI) 27.0-27.9, adult: Secondary | ICD-10-CM | POA: Diagnosis not present

## 2022-10-01 DIAGNOSIS — M47816 Spondylosis without myelopathy or radiculopathy, lumbar region: Secondary | ICD-10-CM | POA: Diagnosis not present

## 2022-10-01 DIAGNOSIS — M48061 Spinal stenosis, lumbar region without neurogenic claudication: Secondary | ICD-10-CM | POA: Diagnosis not present

## 2022-10-05 ENCOUNTER — Telehealth (HOSPITAL_COMMUNITY): Payer: Medicare PPO | Admitting: Psychiatry

## 2022-10-05 DIAGNOSIS — M5126 Other intervertebral disc displacement, lumbar region: Secondary | ICD-10-CM | POA: Diagnosis not present

## 2022-10-05 DIAGNOSIS — M79606 Pain in leg, unspecified: Secondary | ICD-10-CM | POA: Diagnosis not present

## 2022-10-05 DIAGNOSIS — M48061 Spinal stenosis, lumbar region without neurogenic claudication: Secondary | ICD-10-CM | POA: Diagnosis not present

## 2022-10-05 DIAGNOSIS — M438X6 Other specified deforming dorsopathies, lumbar region: Secondary | ICD-10-CM | POA: Diagnosis not present

## 2022-10-07 ENCOUNTER — Other Ambulatory Visit (HOSPITAL_COMMUNITY): Payer: Self-pay | Admitting: *Deleted

## 2022-10-07 ENCOUNTER — Telehealth (HOSPITAL_COMMUNITY): Payer: Self-pay | Admitting: *Deleted

## 2022-10-07 DIAGNOSIS — F419 Anxiety disorder, unspecified: Secondary | ICD-10-CM

## 2022-10-07 DIAGNOSIS — F4321 Adjustment disorder with depressed mood: Secondary | ICD-10-CM

## 2022-10-07 DIAGNOSIS — F331 Major depressive disorder, recurrent, moderate: Secondary | ICD-10-CM

## 2022-10-07 MED ORDER — SERTRALINE HCL 50 MG PO TABS: ORAL_TABLET | ORAL | 0 refills | Status: DC

## 2022-10-07 NOTE — Telephone Encounter (Signed)
Pt called requesting to increase the Zoloft 50 mg every day. Pt says that this dose is just not helping. Pt started the Zoloft on 07/13/22. Pt still feeling depressed. Pt next visit is scheduled for 10/20/22. Please review and advise.

## 2022-10-07 NOTE — Telephone Encounter (Signed)
Pt agrees and verbalizes understanding.

## 2022-10-07 NOTE — Telephone Encounter (Signed)
Yes, she can take 75.  I tried to increase the dose on the last visit but patient was not ready at that time.  Please call the new prescription Zoloft 75 mg daily.  She can take 50 mg 1-1/2 tablet.

## 2022-10-11 ENCOUNTER — Encounter (INDEPENDENT_AMBULATORY_CARE_PROVIDER_SITE_OTHER): Payer: Medicare PPO | Admitting: Otolaryngology

## 2022-10-11 ENCOUNTER — Other Ambulatory Visit: Payer: Self-pay | Admitting: Rheumatology

## 2022-10-11 NOTE — Telephone Encounter (Signed)
Last Fill: 09/13/2022  Next Visit: 12/09/2022  Last Visit: 09/09/2022  Dx: Fibromyalgia   Current Dose per office note on 09/09/2022: tizanidine 4 mg at bedtime   Okay to refill Tizanidine?

## 2022-10-13 DIAGNOSIS — M5412 Radiculopathy, cervical region: Secondary | ICD-10-CM | POA: Diagnosis not present

## 2022-10-14 ENCOUNTER — Other Ambulatory Visit (INDEPENDENT_AMBULATORY_CARE_PROVIDER_SITE_OTHER): Payer: Self-pay | Admitting: Otolaryngology

## 2022-10-14 ENCOUNTER — Encounter: Payer: Self-pay | Admitting: Podiatry

## 2022-10-14 ENCOUNTER — Ambulatory Visit: Payer: Medicare PPO | Admitting: Podiatry

## 2022-10-14 DIAGNOSIS — L84 Corns and callosities: Secondary | ICD-10-CM

## 2022-10-14 DIAGNOSIS — E11628 Type 2 diabetes mellitus with other skin complications: Secondary | ICD-10-CM

## 2022-10-14 DIAGNOSIS — Z6829 Body mass index (BMI) 29.0-29.9, adult: Secondary | ICD-10-CM

## 2022-10-14 DIAGNOSIS — G4733 Obstructive sleep apnea (adult) (pediatric): Secondary | ICD-10-CM

## 2022-10-14 MED ORDER — UREA 10 % EX CREA: TOPICAL_CREAM | CUTANEOUS | 0 refills | Status: DC | PRN

## 2022-10-14 NOTE — Progress Notes (Signed)
Pre-auth obtained for [facility], placing an order for surgery scheduling with instructions.

## 2022-10-14 NOTE — Progress Notes (Signed)
  Subjective:  Patient ID: Christina Lewis, female    DOB: November 18, 1955,  MRN: [id number]  Chief Complaint  Patient presents with   Callouses    "I had what I thought was a Plantars Wart but it is gone.  I do have this huge callus.  So, look at both places." N - calluses L - 2,3 met area left D - 20 years O - on and off C - throbby sharp pain A - walking, going barefoot T - Salicylic Acid patch    67 y.o. female presents with concern for painful lesions on the plantar aspect of both feet.  Patient does have a history of type 2 diabetes but denies neuropathy.  Past Medical History:  Diagnosis Date   Allergy    Anxiety    Arthritis    Depression    Diabetes mellitus    Fibromyalgia    GERD (gastroesophageal reflux disease)    Gout    Hematuria    History of kidney stones    History of tics    Ineffective esophageal motility    Interstitial cystitis 2016   Migraines    Osteoarthritis    PCOS (polycystic ovarian syndrome) 1993   Pneumonia 2019   PONV (postoperative nausea and vomiting)    Sleep apnea    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, took medication for a year   Thyroid nodule     Allergies  Allergen Reactions   Sulfa Antibiotics Anaphylaxis   Atorvastatin Other (See Comments)   Codeine Itching   Erythromycin Nausea And Vomiting   Metronidazole Diarrhea   Morphine And Codeine Itching   Semaglutide Nausea And Vomiting   Sulfamethoxazole-Trimethoprim Other (See Comments)    ROS: Negative except as per HPI above  Objective:  General: AAO x3, NAD  Dermatological: Keratotic lesion with central hyperkeratotic core present on the plantar aspect of the left foot subsecond and third metatarsal heads as well as on the right foot subsecond metatarsal head x 3 lesions total.  Pain on palpation is lesions no underlying ulceration  Vascular:  Dorsalis Pedis artery and Posterior Tibial artery pedal pulses are 2/4 bilateral.  Capillary fill time < 3  sec to all digits.   Neruologic: Grossly intact via light touch bilateral. Protective threshold intact to all sites bilateral.   Musculoskeletal: Hammertoe noted bilateral lesser digits  Gait: Unassisted, Nonantalgic.   No images are attached to the encounter.   Assessment:   1. Pre-ulcerative calluses   2. Type 2 diabetes mellitus with other skin complication, without long-term current use of insulin (HCC)      Plan:  Patient was evaluated and treated and all questions answered.  # Preulcerative calluses/porokeratosis # DM2 All symptomatic hyperkeratoses  x 3were safely debrided with a sterile #15 blade to patient's level of comfort without incident. We discussed preventative and palliative care of these lesions including supportive and accommodative shoegear, padding, prefabricated and custom molded accommodative orthoses, use of a pumice stone and lotions/creams daily.   Return in about 3 months (around 01/14/2023) for [facility].          Corinna Gab, DPM [facility]

## 2022-10-18 ENCOUNTER — Other Ambulatory Visit: Payer: Self-pay | Admitting: Rheumatology

## 2022-10-18 NOTE — Telephone Encounter (Signed)
Last Fill: 09/13/2022  Next Visit: 12/09/2022  Last Visit: 09/09/2022  Dx: Rheumatoid arthritis of multiple sites with negative rheumatoid factor, Fibromyalgia   Current Dose per office note on 09/09/2022 : not mentioned  Okay to refill Baclofen?

## 2022-10-19 DIAGNOSIS — Z6827 Body mass index (BMI) 27.0-27.9, adult: Secondary | ICD-10-CM | POA: Diagnosis not present

## 2022-10-19 DIAGNOSIS — M48061 Spinal stenosis, lumbar region without neurogenic claudication: Secondary | ICD-10-CM | POA: Diagnosis not present

## 2022-10-19 DIAGNOSIS — M47816 Spondylosis without myelopathy or radiculopathy, lumbar region: Secondary | ICD-10-CM | POA: Diagnosis not present

## 2022-10-20 ENCOUNTER — Encounter (HOSPITAL_COMMUNITY): Payer: Self-pay | Admitting: Psychiatry

## 2022-10-20 ENCOUNTER — Telehealth (HOSPITAL_BASED_OUTPATIENT_CLINIC_OR_DEPARTMENT_OTHER): Payer: Medicare PPO | Admitting: Psychiatry

## 2022-10-20 VITALS — Wt 161.0 lb

## 2022-10-20 DIAGNOSIS — F331 Major depressive disorder, recurrent, moderate: Secondary | ICD-10-CM

## 2022-10-20 DIAGNOSIS — F4321 Adjustment disorder with depressed mood: Secondary | ICD-10-CM

## 2022-10-20 DIAGNOSIS — F419 Anxiety disorder, unspecified: Secondary | ICD-10-CM | POA: Diagnosis not present

## 2022-10-20 MED ORDER — SERTRALINE HCL 50 MG PO TABS
ORAL_TABLET | ORAL | 2 refills | Status: DC
Start: 2022-10-20 — End: 2023-01-17

## 2022-10-20 NOTE — Progress Notes (Signed)
[facility] MD Virtual Progress Note   Patient Location: [facility] Home Provider Location: Home Office  I connect with patient by video and verified that I am speaking with correct person by using two identifiers. I discussed the limitations of evaluation and management by telemedicine and the availability of in person appointments. I also discussed with the patient that there may be a patient responsible charge related to this service. The patient expressed understanding and agreed to proceed.  Christina Lewis [id number] 67 y.o.  10/20/2022 11:33 AM  History of Present Illness:  Patient is evaluated by video session.  She called last week requesting to increase the Zoloft and now she is taking 75 mg.  She noticed improvement in her depression and mood.  She has more energy, more motivation to do things.  She is not having crying spells.  She started grief counseling through hospice and she has run up to session on that.  She is not sure if she would like to continue therapy after grief counseling but looking into it.  Patient is still living at her sister's house as needing her title of the home which is under her husband's name who is deceased now.  Her plan is to sell the house and move into a smaller place.  She admitted sleep still is an issue and hoping that she will get inspire device in October.  She had a good support from her sister.  She denies any suicidal thoughts, homicidal thoughts.  She denies any agitation, panic attack or any feeling of hopelessness.  She admitted noticed diarrhea for past few days but not sure if it is caused by increase Zoloft to something else.  She is sleeping better.  Usually diarrhea happens in the morning.  She has no tremor or shakes or any EPS.  Patient recently had blood work and it is normal.  Past Psychiatric History: History of depression in 83 and given Prozac and Lexapro by PCP.  Saw Dr Evelene Croon in 2012 and prescribed Cymbalta.  Dose  increased to 120 mg and later doxepin was added.  They were discontinued after found ineffective.  We tried Wellbutrin but had side effects.  Took gabapentin to help neuropathy but ineffective.  No history of suicidal attempt, inpatient treatment, psychosis, mania, abuse, legal issues, seizures, TBI.    Outpatient Encounter Medications as of 10/20/2022  Medication Sig   acetaminophen (TYLENOL) 650 MG CR tablet Take 1,300 mg by mouth every 8 (eight) hours as needed for pain.   aspirin EC 81 MG tablet Take 81 mg by mouth daily. Swallow whole.   baclofen (LIORESAL) 10 MG tablet TAKE 1 TABLET BY MOUTH EACH MORNING AND 1 TABLET AT NOON.   butalbital-acetaminophen-caffeine (FIORICET) 50-325-40 MG tablet Take 1-2 tablets by mouth every 6 (six) hours as needed for headache.   cholecalciferol (VITAMIN D) 25 MCG (1000 UNIT) tablet Take 1,000 Units by mouth daily.   desloratadine (CLARINEX) 5 MG tablet Take 1 tablet (5 mg total) by mouth daily.   Eptinezumab-jjmr (VYEPTI) 100 MG/ML injection Inject into the vein every 3 (three) months.   ferrous sulfate 325 (65 FE) MG tablet Take 325 mg by mouth daily.   fluticasone (FLONASE ALLERGY RELIEF) 50 MCG/ACT nasal spray Place 1 spray into both nostrils in the morning and at bedtime.   folic acid (FOLVITE) 1 MG tablet Take 2 tablets (2 mg total) by mouth daily.   hydroxychloroquine (PLAQUENIL) 200 MG tablet TAKE 1 TABLET TWICE DAILY MONDAY THRU FRIDAY  FOR RHEUMATOID ARTHRTITIS.   JARDIANCE 10 MG TABS tablet Take 10 mg by mouth daily.   MAGNESIUM PO Take 500 mg by mouth at bedtime.   Methotrexate, PF, (RASUVO) 20 MG/0.4ML SOAJ Inject 20 mg into the skin once a week.   Multiple Vitamin (MULTIVITAMIN WITH MINERALS) TABS tablet Take 1 tablet by mouth daily.   omeprazole (PRILOSEC) 40 MG capsule Take 40 mg by mouth daily.   ondansetron (ZOFRAN-ODT) 8 MG disintegrating tablet Take 8 mg by mouth every 8 (eight) hours as needed for nausea.    Rimegepant Sulfate (NURTEC)  75 MG TBDP Take 1 tablet (75 mg total) by mouth daily as needed. For migraines. Take as close to onset of migraine as possible. One daily maximum.   sertraline (ZOLOFT) 50 MG tablet Take 1.5 tablets (75 mg total dose) daily.   tiZANidine (ZANAFLEX) 4 MG tablet TAKE ONE TABLET BY MOUTH AT BEDTIME AS NEEDED.   TURMERIC-GINGER PO Take 1 tablet by mouth daily.   urea (CARMOL) 10 % cream Apply topically as needed.   No facility-administered encounter medications on file as of 10/20/2022.    Recent Results (from the past 2160 hour(s))  CBC with Differential/Platelet     Status: Abnormal   Collection Time: 09/09/22 11:10 AM  Result Value Ref Range   WBC 11.6 (H) 3.8 - 10.8 Thousand/uL   RBC 4.39 3.80 - 5.10 Million/uL   Hemoglobin 13.6 11.7 - 15.5 g/dL   HCT 03.5 00.9 - 38.1 %   MCV 93.4 80.0 - 100.0 fL   MCH 31.0 27.0 - 33.0 pg   MCHC 33.2 32.0 - 36.0 g/dL   RDW 82.9 93.7 - 16.9 %   Platelets 370 140 - 400 Thousand/uL   MPV 10.2 7.5 - 12.5 fL   Neutro Abs 7,250 1,500 - 7,800 cells/uL   Lymphs Abs 2,842 850 - 3,900 cells/uL   Absolute Monocytes 870 200 - 950 cells/uL   Eosinophils Absolute 499 15 - 500 cells/uL   Basophils Absolute 139 0 - 200 cells/uL   Neutrophils Relative % 62.5 %   Total Lymphocyte 24.5 %   Monocytes Relative 7.5 %   Eosinophils Relative 4.3 %   Basophils Relative 1.2 %  COMPLETE METABOLIC PANEL WITH GFR     Status: None   Collection Time: 09/09/22 11:10 AM  Result Value Ref Range   Glucose, Bld 90 65 - 99 mg/dL    Comment: .            Fasting reference interval .    BUN 20 7 - 25 mg/dL   Creat 6.78 9.38 - 1.01 mg/dL   eGFR 91 > OR = 60 BP/ZWC/5.85I7   BUN/Creatinine Ratio SEE NOTE: 6 - 22 (calc)    Comment:    Not Reported: BUN and Creatinine are within    reference range. .    Sodium 141 135 - 146 mmol/L   Potassium 4.7 3.5 - 5.3 mmol/L   Chloride 107 98 - 110 mmol/L   CO2 25 20 - 32 mmol/L   Calcium 10.2 8.6 - 10.4 mg/dL   Total Protein 6.8 6.1 -  8.1 g/dL   Albumin 4.5 3.6 - 5.1 g/dL   Globulin 2.3 1.9 - 3.7 g/dL (calc)   AG Ratio 2.0 1.0 - 2.5 (calc)   Total Bilirubin 0.2 0.2 - 1.2 mg/dL   Alkaline phosphatase (APISO) 80 37 - 153 U/L   AST 20 10 - 35 U/L   ALT 27 6 - 29  U/L  Glucose, capillary     Status: Abnormal   Collection Time: 09/29/22 12:36 PM  Result Value Ref Range   Glucose-Capillary 122 (H) 70 - 99 mg/dL    Comment: Glucose reference range applies only to samples taken after fasting for at least 8 hours.   Comment 1 Notify RN    Comment 2 Document in Chart   Glucose, capillary     Status: Abnormal   Collection Time: 09/29/22  1:28 PM  Result Value Ref Range   Glucose-Capillary 104 (H) 70 - 99 mg/dL    Comment: Glucose reference range applies only to samples taken after fasting for at least 8 hours.   Comment 1 Notify RN    Comment 2 Document in Chart      Psychiatric Specialty Exam: Physical Exam  Review of Systems  Gastrointestinal:  Positive for diarrhea.    Weight 161 lb (73 kg).There is no height or weight on file to calculate BMI.  General Appearance: Casual  Eye Contact:  Good  Speech:  Clear and Coherent and Normal Rate  Volume:  Normal  Mood:  Euthymic  Affect:  Appropriate  Thought Process:  Goal Directed  Orientation:  Full (Time, Place, and Person)  Thought Content:  WDL  Suicidal Thoughts:  No  Homicidal Thoughts:  No  Memory:  Immediate;   Good Recent;   Good Remote;   Good  Judgement:  Intact  Insight:  Good  Psychomotor Activity:  Normal  Concentration:  Concentration: Good and Attention Span: Good  Recall:  Good  Fund of Knowledge:  Good  Language:  Good  Akathisia:  No  Handed:  Right  AIMS (if indicated):     Assets:  Communication Skills Desire for Improvement Housing Resilience Social Support  ADL's:  Intact  Cognition:  WNL  Sleep:  ok     Assessment/Plan: MDD (major depressive disorder), recurrent episode, moderate (HCC) - Plan: sertraline (ZOLOFT) 50 MG  tablet  Anxiety - Plan: sertraline (ZOLOFT) 50 MG tablet  Grief - Plan: sertraline (ZOLOFT) 50 MG tablet  Patient is still going through grief and started grief counseling.  She has 1 or 2 sessions left.  I recommend should consider therapy once she finished grief counseling to help her coping skills.  She is doing better with Zoloft 75 mg but not sure that caused diarrhea.  I encouraged to closely monitor and if there is still persist then we may need to either reduce the medication dose or try something different.  She has no other major concern.  I reviewed blood work results.  Discussed medication side effects and benefits.  She feels overall much better with dose adjustment of Zoloft.  Recommend to call us back if she is any question or any concern.  Patient like to have a follow-up in 3 months.   Follow Up Instructions:     I discussed the assessment and treatment plan with the patient. The patient was provided an opportunity to ask questions and all were answered. The patient agreed with the plan and demonstrated an understanding of the instructions.   The patient was advised to call back or seek an in-person evaluation if the symptoms worsen or if the condition fails to improve as anticipated.    Collaboration of Care: Other provider involved in patient's care AEB notes are available in epic to review.  Patient/Guardian was advised Release of Information must be obtained prior to any record release in order to collaborate their care with  an outside provider. Patient/Guardian was advised if they have not already done so to contact the registration department to sign all necessary forms in order for [place] to release information regarding their care.   Consent: Patient/Guardian gives verbal consent for treatment and assignment of benefits for services provided during this visit. Patient/Guardian expressed understanding and agreed to proceed.     I provided 15 minutes of non face to face  time during this encounter.  Note: This document was prepared by [employer] voice dictation technology and any errors that results from this process are unintentional.    Cleotis Nipper, MD 10/20/2022

## 2022-10-21 ENCOUNTER — Ambulatory Visit (INDEPENDENT_AMBULATORY_CARE_PROVIDER_SITE_OTHER): Payer: Medicare PPO | Admitting: Otolaryngology

## 2022-10-22 DIAGNOSIS — E119 Type 2 diabetes mellitus without complications: Secondary | ICD-10-CM | POA: Diagnosis not present

## 2022-10-22 DIAGNOSIS — Z7984 Long term (current) use of oral hypoglycemic drugs: Secondary | ICD-10-CM | POA: Diagnosis not present

## 2022-10-22 DIAGNOSIS — H2511 Age-related nuclear cataract, right eye: Secondary | ICD-10-CM | POA: Diagnosis not present

## 2022-10-22 DIAGNOSIS — Z961 Presence of intraocular lens: Secondary | ICD-10-CM | POA: Diagnosis not present

## 2022-10-22 DIAGNOSIS — Z79899 Other long term (current) drug therapy: Secondary | ICD-10-CM | POA: Diagnosis not present

## 2022-10-22 DIAGNOSIS — H0288B Meibomian gland dysfunction left eye, upper and lower eyelids: Secondary | ICD-10-CM | POA: Diagnosis not present

## 2022-10-22 DIAGNOSIS — H0288A Meibomian gland dysfunction right eye, upper and lower eyelids: Secondary | ICD-10-CM | POA: Diagnosis not present

## 2022-10-22 DIAGNOSIS — M0560 Rheumatoid arthritis of unspecified site with involvement of other organs and systems: Secondary | ICD-10-CM | POA: Diagnosis not present

## 2022-10-22 LAB — HM DIABETES EYE EXAM

## 2022-10-26 ENCOUNTER — Encounter (HOSPITAL_COMMUNITY): Payer: Self-pay

## 2022-10-26 ENCOUNTER — Ambulatory Visit (HOSPITAL_COMMUNITY): Payer: Medicare PPO

## 2022-11-01 ENCOUNTER — Ambulatory Visit: Payer: Medicare PPO | Attending: Otolaryngology | Admitting: Audiologist

## 2022-11-01 DIAGNOSIS — H903 Sensorineural hearing loss, bilateral: Secondary | ICD-10-CM

## 2022-11-01 NOTE — Procedures (Signed)
  Outpatient Audiology and [facility] [address] [place], [place]  [address] [phone]  AUDIOLOGICAL  EVALUATION  NAME: Christina Lewis     DOB:   November 30, 1955      MRN: [id number]                                                                                     DATE: 11/01/2022     REFERENT: Sonny Masters, FNP STATUS: Outpatient DIAGNOSIS: Presbycusis, Tinnitus    History: Christina Lewis was seen for an audiological evaluation on referral of Otolaryngology. Christina Lewis has popping and pressure in her ears intermittently. This happens more often in the right ear. It feels like she has water in her ear. The last episode was last Saturday. She has some ringing but it is not bothersome. Christina Lewis denies any difficulty hearing today. She has sinus issues all her life. She has history of migraines. She has never had a hearing test before.   Evaluation:  Otoscopy showed a clear view of the tympanic membranes, bilaterally Tympanometry results were consistent with normal middle ear function, bilaterally   Audiometric testing was completed using Conventional Audiometry techniques with insert earphones and TDH headphones. Test results are consistent with normal sloping to mild sensorineural  hearing loss bilaterally. Hearing is symmetric. Speech Recognition Thresholds were obtained at 25dB HL in the right ear and at 20dB HL in the left ear. Word Recognition Testing was completed at 40dB SL, 100% in the right ear and 92% in the left ear.   Results:  The test results were reviewed with Christina Lewis. 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 There is no indication of middle ear dysfunction today. Recommend monitoring hearing every year. She is not yet a hearing aid candidate.   Recommendations: 1.   Monitor hearing loss annually.    23 minutes spent testing and counseling on results.   If you have any questions please feel free to contact me at [phone].  Ammie Ferrier Audiologist,  Au.D., CCC-A 11/01/2022  3:32 PM  Cc: Sonny Masters, FNP

## 2022-11-02 DIAGNOSIS — M5416 Radiculopathy, lumbar region: Secondary | ICD-10-CM | POA: Diagnosis not present

## 2022-11-03 DIAGNOSIS — G43711 Chronic migraine without aura, intractable, with status migrainosus: Secondary | ICD-10-CM | POA: Diagnosis not present

## 2022-11-09 ENCOUNTER — Other Ambulatory Visit: Payer: Self-pay | Admitting: *Deleted

## 2022-11-09 MED ORDER — TIZANIDINE HCL 4 MG PO TABS: 4.0000 mg | ORAL_TABLET | Freq: Every evening | ORAL | 0 refills | Status: DC | PRN

## 2022-11-09 NOTE — Telephone Encounter (Signed)
Last Fill: 10/11/2022   Next Visit: 12/09/2022   Last Visit: 09/09/2022   Dx: Fibromyalgia    Current Dose per office note on 09/09/2022: tizanidine 4 mg at bedtime    Okay to refill Tizanidine?

## 2022-11-11 ENCOUNTER — Ambulatory Visit (INDEPENDENT_AMBULATORY_CARE_PROVIDER_SITE_OTHER): Payer: Medicare PPO | Admitting: Otolaryngology

## 2022-11-11 ENCOUNTER — Encounter (INDEPENDENT_AMBULATORY_CARE_PROVIDER_SITE_OTHER): Payer: Self-pay | Admitting: Otolaryngology

## 2022-11-11 VITALS — BP 121/77 | HR 68 | Ht 64.0 in | Wt 161.4 lb

## 2022-11-11 DIAGNOSIS — Z6829 Body mass index (BMI) 29.0-29.9, adult: Secondary | ICD-10-CM

## 2022-11-11 DIAGNOSIS — G4733 Obstructive sleep apnea (adult) (pediatric): Secondary | ICD-10-CM

## 2022-11-11 NOTE — Progress Notes (Signed)
ENT Progress Note:  Update 11/11/22:  She returns following DISE, and was deemed a candidate for [facility] Implant. No new illnesses. She reports no new medications. Her Audiogram showed SNHL b/l symmetric, c/w age-related changes.   Initial evaluation 09/22/22  Reason for Consult: OSA, nasal congestion and facial pain/pressure/post-nasal drainage/headaches/tinnitus    HPI: Christina Lewis is an 67 y.o. female with hx RA affecting small joints, f.b Rheum on Methotrexate and Plaquenil, hx of OSA, previously on CPAP for many years, however, discontinued CPAP use 2/2 being unable to tolerate the mask, here for multiple complaints.   1)  She is here for nasal congestion, nasal obstruction, and frontal headaches for years, feels nasal drainage and post-nasal drainage. She has decreased sense of smell, and she thinks it happened after covid 2021. Had allergy testing 4 years ago and it was negative. She had allergy shots for 4 yrs when she was a child. On Flonase and Zyrtec. She has hx of migraines, and feels they are under control after starting an infusion medication (new drug for migraines). She has frontal headache on the left when it acts up and with her sinus sx. 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 No new sx   3) hx of OSA and had three CPAP machines (used one for 1.5 yrs) and diagnosed in 2002. She stopped using it due to intoleence and was recently re-established with sleep medicine. Recent sleep study done with [facility] neurology and she is interested in [place] implant.   3) Bilateral tinnitus, water in her ear, worse on the left, and she had ear infections in her ears and chronic sinus all her life, she  4) She reports hx of Esophageal dysmotility/GERD - on Prilosec.    Records  Reviewed:  Being seen by Neurology for migraines - last visit with NP [place] 04/08/22 - migraines behind left eye with aura sx   TFTs have always normal has Endocrinologist.  Thyroid U/S 05/06/22  Estimated total number of nodules >/= 1 cm: 2   Number of spongiform nodules >/=  2 cm not described below (TR1): 0   Number of mixed cystic and solid nodules >/= 1.5 cm not described below (TR2): 0   _________________________________________________________   Nodule # 4: Previously biopsied nodule in the left mid gland is unchanged at 1.2 x 1.2 x 1.1 cm.   Nodule # 6: Previously biopsied nodule in the left inferior gland is unchanged at 1.8 x 1.6 x 1.2 cm.   Additional small subcentimeter thyroid nodules again noted scattered throughout the right gland. No interval change. No new nodules or suspicious features.   IMPRESSION: 1. Previously biopsied nodules in the left mid and left lower gland remain unchanged. Greater than 10 years of stability is consistent with benignity. 2. No new nodules or suspicious features.    Past Medical History:  Diagnosis Date   Allergy    Anxiety    Arthritis    Depression    Diabetes mellitus    Fibromyalgia    GERD (gastroesophageal reflux disease)    Gout    Hematuria    History of kidney stones    History of tics    Ineffective esophageal motility    Interstitial cystitis 2016   Migraines    Osteoarthritis  PCOS (polycystic ovarian syndrome) 1993   Pneumonia 2019   PONV (postoperative nausea and vomiting)    Sleep apnea    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, took medication for a year   Thyroid nodule     Past Surgical History:  Procedure Laterality Date   ABDOMINAL HYSTERECTOMY  2000   APPENDECTOMY  1976   BALLOON DILATION N/A 11/03/2021   Procedure: BALLOON DILATION;  Surgeon: Kerin Salen, MD;  Location: [facility];  Service: Gastroenterology;  Laterality: N/A;   BOTOX INJECTION N/A 11/03/2021    Procedure: BOTOX INJECTION;  Surgeon: Kerin Salen, MD;  Location: [facility];  Service: Gastroenterology;  Laterality: N/A;   BREAST LUMPECTOMY  1989   lt-negative   CARDIOVASCULAR STRESS TEST  2000   CATARACT EXTRACTION W/PHACO Left 12/07/2019   Procedure: CATARACT EXTRACTION PHACO AND INTRAOCULAR LENS PLACEMENT (IOC);  Surgeon: [name], MD;  Location: [facility];  Service: Ophthalmology;  Laterality: Left;  CDE: 5.55   CHOLECYSTECTOMY  1985   COLONOSCOPY     DRUG INDUCED ENDOSCOPY Bilateral 09/29/2022   Procedure: DRUG INDUCED ENDOSCOPY;  Surgeon: Ashok Croon, MD;  Location: [facility];  Service: ENT;  Laterality: Bilateral;  15 MINUTES   ESOPHAGEAL MANOMETRY N/A 08/24/2021   Procedure: ESOPHAGEAL MANOMETRY (EM);  Surgeon: Kerin Salen, MD;  Location: [facility];  Service: Gastroenterology;  Laterality: N/A;   ESOPHAGOGASTRODUODENOSCOPY N/A 11/03/2021   Procedure: ESOPHAGOGASTRODUODENOSCOPY (EGD);  Surgeon: Kerin Salen, MD;  Location: [facility];  Service: Gastroenterology;  Laterality: N/A;   HYSTERECTOMY ABDOMINAL WITH SALPINGECTOMY Bilateral 2000   KIDNEY STONE SURGERY Right 2014   with stent placement in OR   KNEE ARTHROSCOPY Right 08/22/2013   Procedure: ARTHROSCOPY RIGHT KNEE FOR INFECTION LAVAGE AND DRAINAGE;  Surgeon: Thera Flake., MD;  Location: [facility];  Service: Orthopedics;  Laterality: Right;   KNEE ARTHROSCOPY WITH PATELLA RECONSTRUCTION Right 08/06/2013   Procedure: RIGHT KNEE ARTHROSCOPY WITH MENISCECTOMY MEDIAL, ARTHROSCOPY KNEE WITH DEBRIDEMENT/SHAVING (CHONDROPLASTY) ;  Surgeon: Thera Flake., MD;  Location: [facility];  Service: Orthopedics;  Laterality: Right;   LUMBAR LAMINECTOMY/DECOMPRESSION MICRODISCECTOMY Right 09/11/2019   Procedure: Right Lumbar Three-Four Microdiscectomy;  Surgeon: Maeola Harman, MD;  Location: [facility];  Service: Neurosurgery;  Laterality: Right;  Right Lumbar Three-Four Microdiscectomy    NASAL SINUS SURGERY     x4   OOPHORECTOMY     SHOULDER SURGERY  2011   right shoulder   THYROID SURGERY  1994   TONSILLECTOMY Bilateral 1974    Family History  Problem Relation Age of Onset   Fibromyalgia Mother    Psoriasis Mother        psoriatic arthritis    Diabetes Mother    Heart disease Mother    Psoriasis Sister        psoriatic arthritis    Diabetes Sister    Migraines Sister    Rheum arthritis Sister    Fibromyalgia Sister    Diabetes Sister     Social History:  reports that she has never smoked. She has been exposed to tobacco smoke. She has never used smokeless tobacco. She reports that she does not drink alcohol and does not use drugs.  Allergies:  Allergies  Allergen Reactions   Sulfa Antibiotics Anaphylaxis   Sulfamethoxazole-Trimethoprim Anaphylaxis   Atorvastatin Other (See Comments)    Severe Muscle and Joint pain with ALL statins    Codeine Itching   Erythromycin Nausea And Vomiting  Metronidazole Diarrhea   Morphine And Codeine Itching   Semaglutide Nausea And Vomiting    Ozempic*    Medications: I have reviewed the patient's current medications.  The PMH, PSH, Medications, Allergies, and SH were reviewed and updated.  ROS: Constitutional: Negative for fever, weight loss and weight gain. Cardiovascular: Negative for chest pain and dyspnea on exertion. Respiratory: Is not experiencing shortness of breath at rest. Gastrointestinal: Negative for nausea and vomiting. Neurological: Negative for headaches. Psychiatric: The patient is not nervous/anxious  Blood pressure 121/77, pulse 68, height 5\' 4"  (1.626 m), weight 161 lb 6.4 oz (73.2 kg), SpO2 95%.  PHYSICAL EXAM:  Exam: General: Well-developed, well-nourished Respiratory Respiratory effort: Equal inspiration and expiration without stridor Cardiovascular Peripheral Vascular: Warm extremities with equal color/perfusion Eyes: No nystagmus with equal extraocular motion  bilaterally Neuro/Psych/Balance: Patient oriented to person, place, and time; Appropriate mood and affect; Gait is intact with no imbalance; Cranial nerves I-XII are intact Head and Face Inspection: Normocephalic and atraumatic without mass or lesion Palpation: Facial skeleton intact without bony stepoffs Salivary Glands: No mass or tenderness Facial Strength: Facial motility symmetric and full bilaterally ENT Pinna: External ear intact and fully developed External canal: Canal is patent with intact skin Bilateral inferior turbinate hypertrophy.  Lips, Teeth, and gums: Mucosa and teeth intact and viable TMJ: No pain to palpation with full mobility Oral cavity/oropharynx: No erythema or exudate, no lesions present Nasopharynx: No mass or lesion with intact mucosa Neck Neck and Trachea: Midline trachea without mass or lesion   Procedure: none  Studies Reviewed: Sleep Study 05/03/2022 done in-lab at [facility]  67 year old female with an underlying medical history of migraine headaches, allergies, arthritis, headache, depression, diabetes, fibromyalgia, reflux disease, gout, kidney stones, interstitial cystitis, PCOS, thyroid nodule, and overweight state, who was previously diagnosed with obstructive sleep apnea. She has not been on PAP therapy for several years 2/2 intolerance. Her original sleep apnea diagnosis was in the 90s and per patient report she was severe at the time. Her Epworth sleepiness score is 5/24, fatigue severity score is 63 out of 63.  Height: 64 in Weight: 174 (BMI 29) Neck Size: 16 in   RESPIRATORY MONITORING:   Based on CMS criteria (using a 4% oxygen desaturation rule for scoring hypopneas), there were 39 apneas (39 obstructive; 0 central; 0 mixed), and 70 hypopneas.  Apnea index was 6.7. Hypopnea index was 12.1. The apnea-hypopnea index was 18.8/hour overall (n/s supine, 75 non-supine; 75.1/hour during REM sleep, 0.0 supine REM).  There were 0 respiratory  effort-related arousals (RERAs).  The RERA index was 0 events/h. Total respiratory disturbance index (RDI) was 18.8 events/h. RDI results showed: supine RDI  0.0 /h; non-supine RDI 18.8 /h; REM RDI 75.1 /h, supine REM RDI 0.0 /h.   Based on AASM criteria (using a 3% oxygen desaturation and /or arousal rule for scoring hypopneas), there were 39 apneas (39 obstructive; 0 central; 0 mixed), and 71 hypopneas. Apnea index was 6.7. Hypopnea index was 12.3. The apnea-hypopnea index was 19.0 overall (0.0 supine, 75 non-supine; 75.1 REM, 0.0 supine REM).  There were 0 respiratory effort-related arousals (RERAs).  The RERA index was 0 events/h. Total respiratory disturbance index (RDI) was 19.0 events/h. RDI results showed: supine RDI  0.0 /h; non-supine RDI 19.0 /h; REM RDI 75.1 /h, supine REM RDI 0.0 /h.    OXIMETRY: Oxyhemoglobin Saturation Nadir during sleep was at  72% from a mean of 94%.  Of the Total sleep time (TST)   hypoxemia (=<  88%) was present for  14.9 minutes, or 4.3% of total sleep time.    LIMB MOVEMENTS: There were 0 periodic limb movements of sleep (0.0/hr), of which 0 (0.0/hr) were associated with an arousal.   AROUSAL: There were 45 arousals in total, for an arousal index of 8 arousals/hour.  Of these, 11 were identified as respiratory-related arousals (2 /h), 0 were PLM-related arousals (0 /h), and 50 were non-specific arousals (9 /h).   EEG: Review of the EEG showed no abnormal electrical discharges and symmetrical bihemispheric findings.     EKG: The EKG revealed normal sinus rhythm (NSR). The average heart rate during sleep was 70 bpm.    AUDIO/VIDEO REVIEW: The audio and video review did not show any abnormal or unusual behaviors, movements, phonations or vocalizations. The patient took no restroom breaks. Snoring was noted throughout the night, ranging from milder to louder.   POST-STUDY QUESTIONNAIRE: Post study, the patient indicated, that sleep was worse than usual   IMPRESSION:   1. Obstructive Sleep Apnea (OSA) 2. Dysfunctions associated with sleep stages or arousal from sleep   RECOMMENDATIONS:  1. This study demonstrates moderate to severe obstructive sleep apnea, with a total AHI of 18.8/hour, and O2 nadir of 72% (during non-supine REM sleep).  Of note, the absence of supine sleep during the study likely underestimates her sleep disordered breathing.  Treatment with positive airway pressure in the form of CPAP is recommended. The patient will be advised to proceed with home AutoPap therapy for now.  A full night laboratory attended CPAP titration study can be considered to optimize therapy settings, mask fit, monitoring of tolerance and of proper oxygen saturations down the road. Other treatment options may be limited, and may include (generally speaking) surgical options in selected patients or the use of an oral appliance in certain patients. Concomitant weight loss is recommended (where clinically appropriate). Please note that untreated obstructive sleep apnea may carry additional perioperative morbidity. Patients with significant obstructive sleep apnea should receive perioperative PAP therapy and the surgeons and particularly the anesthesiologist should be informed of the diagnosis and the severity of the sleep disordered breathing. 2. This study shows sleep fragmentation and abnormal sleep stage percentages; these are nonspecific findings and per se do not signify an intrinsic sleep disorder or a cause for the patient's sleep-related symptoms. Causes include (but are not limited to) the first night effect of the sleep study, circadian rhythm disturbances, medication effect or an underlying mood disorder or medical problem.  3. The patient should be cautioned not to drive, work at heights, or operate dangerous or heavy equipment when tired or sleepy. Review and reiteration of good sleep hygiene measures should be pursued with any patient. 4. The patient will be seen in  follow-up in the sleep clinic at [facility] for discussion of the test results, symptom and treatment compliance review, further management strategies, etc. The patient and her referring provider will be notified of the test results.        Assessment/Plan: Encounter Diagnoses  Name Primary?   Obstructive sleep apnea Yes   Body mass index 29.0-29.9, adult      OSA dx many years ago and CPAP intolerance/non-compliance due to discomfort despite of trial of various masks, I reviewed her most recent study from 3/204 with [facility] and based on her recent BMI and sleep study results, she is a candidate for inspire therapy.  I discussed risks benefits and postoperative care for the procedure and she would like to proceed with  the final stage of the workup for inspire implant.  Will work on scheduling her for drug-induced sleep endoscopy Nasal congestion chronic headaches concern for chronic sinusitis in the setting of allergies and history of sinus surgery in the past -nasal endoscopy today with evidence of nasal mucosal edema and prior FESS, no purulence or polyps, unable to fully visualize sinuses, will obtain sinus CT to evaluate Bilateral tinnitus described as ocean sounds, DDx age-related hearing loss versus eustachian tube dysfunction -will obtain audiogram Thyroid nodules -she will continue to follow-up with serial thyroid ultrasounds ordered by her PCP.  Prior workup including FNA not concerning for atypical cells and normal TFTs in the past.   - schedule hearing test - start nasal saline rinses Christina Lewis Med bottle)  - stop Zyrtec, start Clarinex, continue Flonase or if start steroid nasal rinses, ok to stop Flonase  - schedule CT sinuses  - work on scheduling of the sleep endoscopy (DISE) - return after testing  Update 11/11/22 She returns discuss Inspire Implant procedure. Doing well. All risks and benefits were discussed and she would like to proceed. All questions answered. She will return for surgery  11/24/2022   Thank you for allowing me to participate in the care of this patient. Please do not hesitate to contact me with any questions or conc erns.   Ashok Croon, MD Otolaryngology [facility] Phone: [phone] Fax: [phone]    11/11/2022, 1:15 PM

## 2022-11-11 NOTE — H&P (View-Only) (Signed)
ENT Progress Note:  Update 11/11/22:  She returns following DISE, and was deemed a candidate for [facility] Implant. No new illnesses. She reports no new medications. Her Audiogram showed SNHL b/l symmetric, c/w age-related changes.   Initial evaluation 09/22/22  Reason for Consult: OSA, nasal congestion and facial pain/pressure/post-nasal drainage/headaches/tinnitus    HPI: Christina Lewis is an 67 y.o. female with hx RA affecting small joints, f.b Rheum on Methotrexate and Plaquenil, hx of OSA, previously on CPAP for many years, however, discontinued CPAP use 2/2 being unable to tolerate the mask, here for multiple complaints.   1)  She is here for nasal congestion, nasal obstruction, and frontal headaches for years, feels nasal drainage and post-nasal drainage. She has decreased sense of smell, and she thinks it happened after covid 2021. Had allergy testing 4 years ago and it was negative. She had allergy shots for 4 yrs when she was a child. On Flonase and Zyrtec. She has hx of migraines, and feels they are under control after starting an infusion medication (new drug for migraines). She has frontal headache on the left when it acts up and with her sinus sx. 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 No new sx   3) hx of OSA and had three CPAP machines (used one for 1.5 yrs) and diagnosed in 2002. She stopped using it due to intoleence and was recently re-established with sleep medicine. Recent sleep study done with [facility] neurology and she is interested in [place] implant.   3) Bilateral tinnitus, water in her ear, worse on the left, and she had ear infections in her ears and chronic sinus all her life, she  4) She reports hx of Esophageal dysmotility/GERD - on Prilosec.    Records  Reviewed:  Being seen by Neurology for migraines - last visit with NP [place] 04/08/22 - migraines behind left eye with aura sx   TFTs have always normal has Endocrinologist.  Thyroid U/S 05/06/22  Estimated total number of nodules >/= 1 cm: 2   Number of spongiform nodules >/=  2 cm not described below (TR1): 0   Number of mixed cystic and solid nodules >/= 1.5 cm not described below (TR2): 0   _________________________________________________________   Nodule # 4: Previously biopsied nodule in the left mid gland is unchanged at 1.2 x 1.2 x 1.1 cm.   Nodule # 6: Previously biopsied nodule in the left inferior gland is unchanged at 1.8 x 1.6 x 1.2 cm.   Additional small subcentimeter thyroid nodules again noted scattered throughout the right gland. No interval change. No new nodules or suspicious features.   IMPRESSION: 1. Previously biopsied nodules in the left mid and left lower gland remain unchanged. Greater than 10 years of stability is consistent with benignity. 2. No new nodules or suspicious features.    Past Medical History:  Diagnosis Date   Allergy    Anxiety    Arthritis    Depression    Diabetes mellitus    Fibromyalgia    GERD (gastroesophageal reflux disease)    Gout    Hematuria    History of kidney stones    History of tics    Ineffective esophageal motility    Interstitial cystitis 2016   Migraines    Osteoarthritis  PCOS (polycystic ovarian syndrome) 1993   Pneumonia 2019   PONV (postoperative nausea and vomiting)    Sleep apnea    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, took medication for a year   Thyroid nodule     Past Surgical History:  Procedure Laterality Date   ABDOMINAL HYSTERECTOMY  2000   APPENDECTOMY  1976   BALLOON DILATION N/A 11/03/2021   Procedure: BALLOON DILATION;  Surgeon: Kerin Salen, MD;  Location: [facility];  Service: Gastroenterology;  Laterality: N/A;   BOTOX INJECTION N/A 11/03/2021    Procedure: BOTOX INJECTION;  Surgeon: Kerin Salen, MD;  Location: [facility];  Service: Gastroenterology;  Laterality: N/A;   BREAST LUMPECTOMY  1989   lt-negative   CARDIOVASCULAR STRESS TEST  2000   CATARACT EXTRACTION W/PHACO Left 12/07/2019   Procedure: CATARACT EXTRACTION PHACO AND INTRAOCULAR LENS PLACEMENT (IOC);  Surgeon: [name], MD;  Location: [facility];  Service: Ophthalmology;  Laterality: Left;  CDE: 5.55   CHOLECYSTECTOMY  1985   COLONOSCOPY     DRUG INDUCED ENDOSCOPY Bilateral 09/29/2022   Procedure: DRUG INDUCED ENDOSCOPY;  Surgeon: Ashok Croon, MD;  Location: [facility];  Service: ENT;  Laterality: Bilateral;  15 MINUTES   ESOPHAGEAL MANOMETRY N/A 08/24/2021   Procedure: ESOPHAGEAL MANOMETRY (EM);  Surgeon: Kerin Salen, MD;  Location: [facility];  Service: Gastroenterology;  Laterality: N/A;   ESOPHAGOGASTRODUODENOSCOPY N/A 11/03/2021   Procedure: ESOPHAGOGASTRODUODENOSCOPY (EGD);  Surgeon: Kerin Salen, MD;  Location: [facility];  Service: Gastroenterology;  Laterality: N/A;   HYSTERECTOMY ABDOMINAL WITH SALPINGECTOMY Bilateral 2000   KIDNEY STONE SURGERY Right 2014   with stent placement in OR   KNEE ARTHROSCOPY Right 08/22/2013   Procedure: ARTHROSCOPY RIGHT KNEE FOR INFECTION LAVAGE AND DRAINAGE;  Surgeon: Thera Flake., MD;  Location: [facility];  Service: Orthopedics;  Laterality: Right;   KNEE ARTHROSCOPY WITH PATELLA RECONSTRUCTION Right 08/06/2013   Procedure: RIGHT KNEE ARTHROSCOPY WITH MENISCECTOMY MEDIAL, ARTHROSCOPY KNEE WITH DEBRIDEMENT/SHAVING (CHONDROPLASTY) ;  Surgeon: Thera Flake., MD;  Location: [facility];  Service: Orthopedics;  Laterality: Right;   LUMBAR LAMINECTOMY/DECOMPRESSION MICRODISCECTOMY Right 09/11/2019   Procedure: Right Lumbar Three-Four Microdiscectomy;  Surgeon: Maeola Harman, MD;  Location: [facility];  Service: Neurosurgery;  Laterality: Right;  Right Lumbar Three-Four Microdiscectomy    NASAL SINUS SURGERY     x4   OOPHORECTOMY     SHOULDER SURGERY  2011   right shoulder   THYROID SURGERY  1994   TONSILLECTOMY Bilateral 1974    Family History  Problem Relation Age of Onset   Fibromyalgia Mother    Psoriasis Mother        psoriatic arthritis    Diabetes Mother    Heart disease Mother    Psoriasis Sister        psoriatic arthritis    Diabetes Sister    Migraines Sister    Rheum arthritis Sister    Fibromyalgia Sister    Diabetes Sister     Social History:  reports that she has never smoked. She has been exposed to tobacco smoke. She has never used smokeless tobacco. She reports that she does not drink alcohol and does not use drugs.  Allergies:  Allergies  Allergen Reactions   Sulfa Antibiotics Anaphylaxis   Sulfamethoxazole-Trimethoprim Anaphylaxis   Atorvastatin Other (See Comments)    Severe Muscle and Joint pain with ALL statins    Codeine Itching   Erythromycin Nausea And Vomiting  Metronidazole Diarrhea   Morphine And Codeine Itching   Semaglutide Nausea And Vomiting    Ozempic*    Medications: I have reviewed the patient's current medications.  The PMH, PSH, Medications, Allergies, and SH were reviewed and updated.  ROS: Constitutional: Negative for fever, weight loss and weight gain. Cardiovascular: Negative for chest pain and dyspnea on exertion. Respiratory: Is not experiencing shortness of breath at rest. Gastrointestinal: Negative for nausea and vomiting. Neurological: Negative for headaches. Psychiatric: The patient is not nervous/anxious  Blood pressure 121/77, pulse 68, height 5\' 4"  (1.626 m), weight 161 lb 6.4 oz (73.2 kg), SpO2 95%.  PHYSICAL EXAM:  Exam: General: Well-developed, well-nourished Respiratory Respiratory effort: Equal inspiration and expiration without stridor Cardiovascular Peripheral Vascular: Warm extremities with equal color/perfusion Eyes: No nystagmus with equal extraocular motion  bilaterally Neuro/Psych/Balance: Patient oriented to person, place, and time; Appropriate mood and affect; Gait is intact with no imbalance; Cranial nerves I-XII are intact Head and Face Inspection: Normocephalic and atraumatic without mass or lesion Palpation: Facial skeleton intact without bony stepoffs Salivary Glands: No mass or tenderness Facial Strength: Facial motility symmetric and full bilaterally ENT Pinna: External ear intact and fully developed External canal: Canal is patent with intact skin Bilateral inferior turbinate hypertrophy.  Lips, Teeth, and gums: Mucosa and teeth intact and viable TMJ: No pain to palpation with full mobility Oral cavity/oropharynx: No erythema or exudate, no lesions present Nasopharynx: No mass or lesion with intact mucosa Neck Neck and Trachea: Midline trachea without mass or lesion   Procedure: none  Studies Reviewed: Sleep Study 05/03/2022 done in-lab at [facility]  67 year old female with an underlying medical history of migraine headaches, allergies, arthritis, headache, depression, diabetes, fibromyalgia, reflux disease, gout, kidney stones, interstitial cystitis, PCOS, thyroid nodule, and overweight state, who was previously diagnosed with obstructive sleep apnea. She has not been on PAP therapy for several years 2/2 intolerance. Her original sleep apnea diagnosis was in the 90s and per patient report she was severe at the time. Her Epworth sleepiness score is 5/24, fatigue severity score is 63 out of 63.  Height: 64 in Weight: 174 (BMI 29) Neck Size: 16 in   RESPIRATORY MONITORING:   Based on CMS criteria (using a 4% oxygen desaturation rule for scoring hypopneas), there were 39 apneas (39 obstructive; 0 central; 0 mixed), and 70 hypopneas.  Apnea index was 6.7. Hypopnea index was 12.1. The apnea-hypopnea index was 18.8/hour overall (n/s supine, 75 non-supine; 75.1/hour during REM sleep, 0.0 supine REM).  There were 0 respiratory  effort-related arousals (RERAs).  The RERA index was 0 events/h. Total respiratory disturbance index (RDI) was 18.8 events/h. RDI results showed: supine RDI  0.0 /h; non-supine RDI 18.8 /h; REM RDI 75.1 /h, supine REM RDI 0.0 /h.   Based on AASM criteria (using a 3% oxygen desaturation and /or arousal rule for scoring hypopneas), there were 39 apneas (39 obstructive; 0 central; 0 mixed), and 71 hypopneas. Apnea index was 6.7. Hypopnea index was 12.3. The apnea-hypopnea index was 19.0 overall (0.0 supine, 75 non-supine; 75.1 REM, 0.0 supine REM).  There were 0 respiratory effort-related arousals (RERAs).  The RERA index was 0 events/h. Total respiratory disturbance index (RDI) was 19.0 events/h. RDI results showed: supine RDI  0.0 /h; non-supine RDI 19.0 /h; REM RDI 75.1 /h, supine REM RDI 0.0 /h.    OXIMETRY: Oxyhemoglobin Saturation Nadir during sleep was at  72% from a mean of 94%.  Of the Total sleep time (TST)   hypoxemia (=<  88%) was present for  14.9 minutes, or 4.3% of total sleep time.    LIMB MOVEMENTS: There were 0 periodic limb movements of sleep (0.0/hr), of which 0 (0.0/hr) were associated with an arousal.   AROUSAL: There were 45 arousals in total, for an arousal index of 8 arousals/hour.  Of these, 11 were identified as respiratory-related arousals (2 /h), 0 were PLM-related arousals (0 /h), and 50 were non-specific arousals (9 /h).   EEG: Review of the EEG showed no abnormal electrical discharges and symmetrical bihemispheric findings.     EKG: The EKG revealed normal sinus rhythm (NSR). The average heart rate during sleep was 70 bpm.    AUDIO/VIDEO REVIEW: The audio and video review did not show any abnormal or unusual behaviors, movements, phonations or vocalizations. The patient took no restroom breaks. Snoring was noted throughout the night, ranging from milder to louder.   POST-STUDY QUESTIONNAIRE: Post study, the patient indicated, that sleep was worse than usual   IMPRESSION:   1. Obstructive Sleep Apnea (OSA) 2. Dysfunctions associated with sleep stages or arousal from sleep   RECOMMENDATIONS:  1. This study demonstrates moderate to severe obstructive sleep apnea, with a total AHI of 18.8/hour, and O2 nadir of 72% (during non-supine REM sleep).  Of note, the absence of supine sleep during the study likely underestimates her sleep disordered breathing.  Treatment with positive airway pressure in the form of CPAP is recommended. The patient will be advised to proceed with home AutoPap therapy for now.  A full night laboratory attended CPAP titration study can be considered to optimize therapy settings, mask fit, monitoring of tolerance and of proper oxygen saturations down the road. Other treatment options may be limited, and may include (generally speaking) surgical options in selected patients or the use of an oral appliance in certain patients. Concomitant weight loss is recommended (where clinically appropriate). Please note that untreated obstructive sleep apnea may carry additional perioperative morbidity. Patients with significant obstructive sleep apnea should receive perioperative PAP therapy and the surgeons and particularly the anesthesiologist should be informed of the diagnosis and the severity of the sleep disordered breathing. 2. This study shows sleep fragmentation and abnormal sleep stage percentages; these are nonspecific findings and per se do not signify an intrinsic sleep disorder or a cause for the patient's sleep-related symptoms. Causes include (but are not limited to) the first night effect of the sleep study, circadian rhythm disturbances, medication effect or an underlying mood disorder or medical problem.  3. The patient should be cautioned not to drive, work at heights, or operate dangerous or heavy equipment when tired or sleepy. Review and reiteration of good sleep hygiene measures should be pursued with any patient. 4. The patient will be seen in  follow-up in the sleep clinic at [facility] for discussion of the test results, symptom and treatment compliance review, further management strategies, etc. The patient and her referring provider will be notified of the test results.        Assessment/Plan: Encounter Diagnoses  Name Primary?   Obstructive sleep apnea Yes   Body mass index 29.0-29.9, adult      OSA dx many years ago and CPAP intolerance/non-compliance due to discomfort despite of trial of various masks, I reviewed her most recent study from 3/204 with [facility] and based on her recent BMI and sleep study results, she is a candidate for inspire therapy.  I discussed risks benefits and postoperative care for the procedure and she would like to proceed with  the final stage of the workup for inspire implant.  Will work on scheduling her for drug-induced sleep endoscopy Nasal congestion chronic headaches concern for chronic sinusitis in the setting of allergies and history of sinus surgery in the past -nasal endoscopy today with evidence of nasal mucosal edema and prior FESS, no purulence or polyps, unable to fully visualize sinuses, will obtain sinus CT to evaluate Bilateral tinnitus described as ocean sounds, DDx age-related hearing loss versus eustachian tube dysfunction -will obtain audiogram Thyroid nodules -she will continue to follow-up with serial thyroid ultrasounds ordered by her PCP.  Prior workup including FNA not concerning for atypical cells and normal TFTs in the past.   - schedule hearing test - start nasal saline rinses [facility] Med bottle)  - stop Zyrtec, start Clarinex, continue Flonase or if start steroid nasal rinses, ok to stop Flonase  - schedule CT sinuses  - work on scheduling of the sleep endoscopy (DISE) - return after testing  Update 11/11/22 She returns discuss Inspire Implant procedure. Doing well. All risks and benefits were discussed and she would like to proceed. All questions answered. She will return for surgery  11/24/2022   Thank you for allowing me to participate in the care of this patient. Please do not hesitate to contact me with any questions or conc erns.   Ashok Croon, MD Otolaryngology [facility] Phone: [phone] Fax: [phone]    11/11/2022, 1:15 PM

## 2022-11-15 NOTE — Progress Notes (Unsigned)
Office Visit Note  Patient: Christina Lewis             Date of Birth: Dec 20, 1955           MRN: [id number]             PCP: Sonny Masters, FNP Referring: Sonny Masters, FNP Visit Date: 11/17/2022 Occupation: @GUAROCC @  Subjective:  Right trochanteric bursitis   History of Present Illness: [name] is a 67 y.o. female with history of rheumatoid arthritis and osteoarthritis.   CBC and CMP updated on 09/09/22.  PLQ Eye Exam: 10/22/2022 WNL @ [facility]   Activities of Daily Living:  Patient reports morning stiffness for *** {minute/hour:19697}.   Patient {ACTIONS;DENIES/REPORTS:21021675::"Denies"} nocturnal pain.  Difficulty dressing/grooming: {ACTIONS;DENIES/REPORTS:21021675::"Denies"} Difficulty climbing stairs: {ACTIONS;DENIES/REPORTS:21021675::"Denies"} Difficulty getting out of chair: {ACTIONS;DENIES/REPORTS:21021675::"Denies"} Difficulty using hands for taps, buttons, cutlery, and/or writing: {ACTIONS;DENIES/REPORTS:21021675::"Denies"}  No Rheumatology ROS completed.   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   Vitamin D deficiency 10/15/2021   Polycystic ovaries 10/15/2021   Migraine without aura and without status migrainosus, not intractable 10/15/2021   BMI 29.0-29.9,adult 10/15/2021   Cellulitis 03/04/2021   Rheumatoid arthritis (HCC) 03/04/2021   Diabetes mellitus type 2 in obese 03/04/2021   Right foot infection 03/04/2021   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 myelopathy 09/11/2019   Leukocytosis 11/04/2016   Fibromyalgia 02/10/2016   Other fatigue 02/10/2016   Primary osteoarthritis of both hands 02/10/2016   History of migraine 02/10/2016    History of thyroid nodule 02/10/2016   History of sleep apnea 02/10/2016   History of renal calculi 02/10/2016   Pain in joint, shoulder region 10/02/2010   Adhesive capsulitis of shoulder 10/02/2010   Muscle weakness (generalized) 10/02/2010    Past Medical History:  Diagnosis Date   Allergy    Anxiety    Arthritis    Depression    Diabetes mellitus    Fibromyalgia    GERD (gastroesophageal reflux disease)    Gout    Hematuria    History of kidney stones    History of tics    Ineffective esophageal motility    Interstitial cystitis 2016   Migraines    Osteoarthritis    PCOS (polycystic ovarian syndrome) 1993   Pneumonia 2019   PONV (postoperative nausea and vomiting)    Sleep apnea    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, took medication for a year   Thyroid nodule     Family History  Problem Relation Age of Onset   Fibromyalgia Mother    Psoriasis Mother        psoriatic arthritis    Diabetes Mother    Heart disease Mother    Psoriasis Sister        psoriatic arthritis    Diabetes Sister    Migraines Sister    Rheum arthritis Sister    Fibromyalgia Sister    Diabetes Sister    Past Surgical History:  Procedure Laterality Date   ABDOMINAL HYSTERECTOMY  2000   APPENDECTOMY  1976   BALLOON DILATION N/A 11/03/2021   Procedure: Marvis Repress DILATION;  Surgeon: Kerin Salen, MD;  Location: [facility];  Service: Gastroenterology;  Laterality:  N/A;   BOTOX INJECTION N/A 11/03/2021   Procedure: BOTOX INJECTION;  Surgeon: Kerin Salen, MD;  Location: [facility];  Service: Gastroenterology;  Laterality: N/A;   BREAST LUMPECTOMY  1989   lt-negative   CARDIOVASCULAR STRESS TEST  2000   CATARACT EXTRACTION W/PHACO Left 12/07/2019   Procedure: CATARACT EXTRACTION PHACO AND INTRAOCULAR LENS PLACEMENT (IOC);  Surgeon: [name], MD;  Location: [facility];  Service: Ophthalmology;  Laterality: Left;  CDE: 5.55   CHOLECYSTECTOMY  1985   COLONOSCOPY      DRUG INDUCED ENDOSCOPY Bilateral 09/29/2022   Procedure: DRUG INDUCED ENDOSCOPY;  Surgeon: Ashok Croon, MD;  Location: [facility];  Service: ENT;  Laterality: Bilateral;  15 MINUTES   ESOPHAGEAL MANOMETRY N/A 08/24/2021   Procedure: ESOPHAGEAL MANOMETRY (EM);  Surgeon: Kerin Salen, MD;  Location: [facility];  Service: Gastroenterology;  Laterality: N/A;   ESOPHAGOGASTRODUODENOSCOPY N/A 11/03/2021   Procedure: ESOPHAGOGASTRODUODENOSCOPY (EGD);  Surgeon: Kerin Salen, MD;  Location: [facility];  Service: Gastroenterology;  Laterality: N/A;   HYSTERECTOMY ABDOMINAL WITH SALPINGECTOMY Bilateral 2000   KIDNEY STONE SURGERY Right 2014   with stent placement in OR   KNEE ARTHROSCOPY Right 08/22/2013   Procedure: ARTHROSCOPY RIGHT KNEE FOR INFECTION LAVAGE AND DRAINAGE;  Surgeon: Thera Flake., MD;  Location: [facility];  Service: Orthopedics;  Laterality: Right;   KNEE ARTHROSCOPY WITH PATELLA RECONSTRUCTION Right 08/06/2013   Procedure: RIGHT KNEE ARTHROSCOPY WITH MENISCECTOMY MEDIAL, ARTHROSCOPY KNEE WITH DEBRIDEMENT/SHAVING (CHONDROPLASTY) ;  Surgeon: Thera Flake., MD;  Location: [facility];  Service: Orthopedics;  Laterality: Right;   LUMBAR LAMINECTOMY/DECOMPRESSION MICRODISCECTOMY Right 09/11/2019   Procedure: Right Lumbar Three-Four Microdiscectomy;  Surgeon: Maeola Harman, MD;  Location: [facility];  Service: Neurosurgery;  Laterality: Right;  Right Lumbar Three-Four Microdiscectomy   NASAL SINUS SURGERY     x4   OOPHORECTOMY     SHOULDER SURGERY  2011   right shoulder   THYROID SURGERY  1994   TONSILLECTOMY Bilateral 1974   Social History   Social History Narrative   Caffiene 4-5 12 oz cans soda.   Working retired - special ed.   Live with home no kids   Immunization History  Administered Date(s) Administered   Tdap 03/04/2021   Zoster Recombinant(Shingrix) 10/15/2021     Objective: Vital Signs: There were no vitals taken for this  visit.   Physical Exam Vitals and nursing note reviewed.  Constitutional:      Appearance: She is well-developed.  HENT:     Head: Normocephalic and atraumatic.  Eyes:     Conjunctiva/sclera: Conjunctivae normal.  Cardiovascular:     Rate and Rhythm: Normal rate and regular rhythm.     Heart sounds: Normal heart sounds.  Pulmonary:     Effort: Pulmonary effort is normal.     Breath sounds: Normal breath sounds.  Abdominal:     General: Bowel sounds are normal.     Palpations: Abdomen is soft.  Musculoskeletal:     Cervical back: Normal range of motion.  Lymphadenopathy:     Cervical: No cervical adenopathy.  Skin:    General: Skin is warm and dry.     Capillary Refill: Capillary refill takes less than 2 seconds.  Neurological:     Mental Status: She is alert and oriented to person, place, and time.  Psychiatric:        Behavior: Behavior normal.      Musculoskeletal Exam: ***  CDAI Exam: CDAI Score: --  Patient Global: --; Provider Global: -- Swollen: --; Tender: -- Joint Exam 11/17/2022   No joint exam has been documented for this visit   There is currently no information documented on the homunculus. Go to the Rheumatology activity and complete the homunculus joint exam.  Investigation: No additional findings.  Imaging: No results found.  Recent Labs: Lab Results  Component Value Date   WBC 11.6 (H) 09/09/2022   HGB 13.6 09/09/2022   PLT 370 09/09/2022   NA 141 09/09/2022   K 4.7 09/09/2022   CL 107 09/09/2022   CO2 25 09/09/2022   GLUCOSE 90 09/09/2022   BUN 20 09/09/2022   CREATININE 0.73 09/09/2022   BILITOT 0.2 09/09/2022   ALKPHOS 82 10/15/2021   AST 20 09/09/2022   ALT 27 09/09/2022   PROT 6.8 09/09/2022   ALBUMIN 4.5 10/15/2021   CALCIUM 10.2 09/09/2022   GFRAA 98 03/24/2020   QFTBGOLDPLUS NEGATIVE 09/29/2021    Speciality Comments: PLQ Eye Exam: 10/22/2022 WNL @ [facility]  f/u 01/2022  Patient has PLQ eye exam  scheduled for 10/22/2022.  Procedures:  No procedures performed Allergies: Sulfa antibiotics, Sulfamethoxazole-trimethoprim, Atorvastatin, Codeine, Erythromycin, Metronidazole, Morphine and codeine, and Semaglutide   Assessment / Plan:     Visit Diagnoses: No diagnosis found.  Orders: No orders of the defined types were placed in this encounter.  No orders of the defined types were placed in this encounter.   Face-to-face time spent with patient was *** minutes. Greater than 50% of time was spent in counseling and coordination of care.  Follow-Up Instructions: No follow-ups on file.   Gearldine Bienenstock, PA-C  Note - This record has been created using Dragon software.  Chart creation errors have been sought, but may not always  have been located. Such creation errors do not reflect on  the standard of medical care.

## 2022-11-17 ENCOUNTER — Encounter (HOSPITAL_COMMUNITY)
Admission: RE | Admit: 2022-11-17 | Discharge: 2022-11-17 | Disposition: A | Discharge status: Home / Self Care | Payer: Medicare PPO | Source: Ambulatory Visit | Attending: Otolaryngology | Admitting: Otolaryngology

## 2022-11-17 ENCOUNTER — Encounter (HOSPITAL_COMMUNITY): Payer: Self-pay

## 2022-11-17 ENCOUNTER — Other Ambulatory Visit: Payer: Self-pay

## 2022-11-17 ENCOUNTER — Ambulatory Visit: Payer: Medicare PPO | Attending: Physician Assistant | Admitting: Physician Assistant

## 2022-11-17 ENCOUNTER — Encounter: Payer: Self-pay | Admitting: Physician Assistant

## 2022-11-17 VITALS — BP 111/69 | HR 66 | Temp 98.2°F | Resp 17 | Ht 64.0 in | Wt 162.9 lb

## 2022-11-17 VITALS — BP 129/86 | HR 71 | Resp 16 | Ht 64.0 in | Wt 163.6 lb

## 2022-11-17 DIAGNOSIS — R5383 Other fatigue: Secondary | ICD-10-CM

## 2022-11-17 DIAGNOSIS — Z872 Personal history of diseases of the skin and subcutaneous tissue: Secondary | ICD-10-CM

## 2022-11-17 DIAGNOSIS — Z840 Family history of diseases of the skin and subcutaneous tissue: Secondary | ICD-10-CM

## 2022-11-17 DIAGNOSIS — M7061 Trochanteric bursitis, right hip: Secondary | ICD-10-CM | POA: Diagnosis not present

## 2022-11-17 DIAGNOSIS — Z87442 Personal history of urinary calculi: Secondary | ICD-10-CM

## 2022-11-17 DIAGNOSIS — M5136 Other intervertebral disc degeneration, lumbar region: Secondary | ICD-10-CM | POA: Diagnosis not present

## 2022-11-17 DIAGNOSIS — Z8719 Personal history of other diseases of the digestive system: Secondary | ICD-10-CM

## 2022-11-17 DIAGNOSIS — M533 Sacrococcygeal disorders, not elsewhere classified: Secondary | ICD-10-CM

## 2022-11-17 DIAGNOSIS — Z01812 Encounter for preprocedural laboratory examination: Secondary | ICD-10-CM | POA: Diagnosis not present

## 2022-11-17 DIAGNOSIS — M5030 Other cervical disc degeneration, unspecified cervical region: Secondary | ICD-10-CM

## 2022-11-17 DIAGNOSIS — Z9289 Personal history of other medical treatment: Secondary | ICD-10-CM

## 2022-11-17 DIAGNOSIS — M19072 Primary osteoarthritis, left ankle and foot: Secondary | ICD-10-CM

## 2022-11-17 DIAGNOSIS — Z79899 Other long term (current) drug therapy: Secondary | ICD-10-CM | POA: Diagnosis not present

## 2022-11-17 DIAGNOSIS — M19071 Primary osteoarthritis, right ankle and foot: Secondary | ICD-10-CM

## 2022-11-17 DIAGNOSIS — M0609 Rheumatoid arthritis without rheumatoid factor, multiple sites: Secondary | ICD-10-CM | POA: Diagnosis not present

## 2022-11-17 DIAGNOSIS — Z8742 Personal history of other diseases of the female genital tract: Secondary | ICD-10-CM

## 2022-11-17 DIAGNOSIS — M19042 Primary osteoarthritis, left hand: Secondary | ICD-10-CM

## 2022-11-17 DIAGNOSIS — Z8639 Personal history of other endocrine, nutritional and metabolic disease: Secondary | ICD-10-CM

## 2022-11-17 DIAGNOSIS — Z01818 Encounter for other preprocedural examination: Secondary | ICD-10-CM

## 2022-11-17 DIAGNOSIS — Z8659 Personal history of other mental and behavioral disorders: Secondary | ICD-10-CM

## 2022-11-17 DIAGNOSIS — M797 Fibromyalgia: Secondary | ICD-10-CM

## 2022-11-17 DIAGNOSIS — M1711 Unilateral primary osteoarthritis, right knee: Secondary | ICD-10-CM

## 2022-11-17 DIAGNOSIS — G8929 Other chronic pain: Secondary | ICD-10-CM

## 2022-11-17 DIAGNOSIS — M51369 Other intervertebral disc degeneration, lumbar region without mention of lumbar back pain or lower extremity pain: Secondary | ICD-10-CM

## 2022-11-17 DIAGNOSIS — M19041 Primary osteoarthritis, right hand: Secondary | ICD-10-CM

## 2022-11-17 DIAGNOSIS — Z8261 Family history of arthritis: Secondary | ICD-10-CM

## 2022-11-17 DIAGNOSIS — Z8669 Personal history of other diseases of the nervous system and sense organs: Secondary | ICD-10-CM

## 2022-11-17 HISTORY — DX: Anemia, unspecified: D64.9

## 2022-11-17 LAB — CBC
HCT: 42.3 % (ref 36.0–46.0)
Hemoglobin: 14 g/dL (ref 12.0–15.0)
MCH: 31 pg (ref 26.0–34.0)
MCHC: 33.1 g/dL (ref 30.0–36.0)
MCV: 93.6 fL (ref 80.0–100.0)
Platelets: 317 10*3/uL (ref 150–400)
RBC: 4.52 MIL/uL (ref 3.87–5.11)
RDW: 14.4 % (ref 11.5–15.5)
WBC: 11.5 10*3/uL — ABNORMAL HIGH (ref 4.0–10.5)
nRBC: 0 % (ref 0.0–0.2)

## 2022-11-17 LAB — GLUCOSE, CAPILLARY: Glucose-Capillary: 148 mg/dL — ABNORMAL HIGH (ref 70–99)

## 2022-11-17 LAB — BASIC METABOLIC PANEL
Anion gap: 9 (ref 5–15)
BUN: 18 mg/dL (ref 8–23)
CO2: 23 mmol/L (ref 22–32)
Calcium: 9.5 mg/dL (ref 8.9–10.3)
Chloride: 106 mmol/L (ref 98–111)
Creatinine, Ser: 0.77 mg/dL (ref 0.44–1.00)
GFR, Estimated: >60 mL/min (ref >60)
Glucose, Bld: 111 mg/dL — ABNORMAL HIGH (ref 70–99)
Potassium: 4 mmol/L (ref 3.5–5.1)
Sodium: 138 mmol/L (ref 135–145)

## 2022-11-17 MED ORDER — LIDOCAINE HCL 1 % IJ SOLN
1.5000 mL | INTRAMUSCULAR | Status: AC | PRN
Start: 2022-11-17 — End: 2022-11-17
  Administered 2022-11-17: 1.5 mL

## 2022-11-17 MED ORDER — TRIAMCINOLONE ACETONIDE 40 MG/ML IJ SUSP
40.0000 mg | INTRAMUSCULAR | Status: AC | PRN
Start: 2022-11-17 — End: 2022-11-17
  Administered 2022-11-17: 40 mg via INTRA_ARTICULAR

## 2022-11-17 NOTE — Progress Notes (Signed)
Surgical Instructions    Your procedure is scheduled on Wednesday, 11/24/22.  Report to [facility] Main Entrance "A" at 6:30 A.M., then check in with the Admitting office.  Call this number if you have problems the morning of surgery:  [phone]   If you have any questions prior to your surgery date call [phone]: Open Monday-Friday 8am-4pm If you experience any cold or flu symptoms such as cough, fever, chills, shortness of breath, etc. between now and your scheduled surgery, please notify us at the above number     Remember:  Do not eat or drink after midnight the night before your surgery-Tuesday.     Take these medicines the morning of surgery with A SIP OF WATER:  acetaminophen (TYLENOL) if needed baclofen (LIORESAL)  (FIORICET) if needed desloratadine (CLARINEX)  FLONASE Nasal Spray gabapentin (NEURONTIN)  omeprazole (PRILOSEC)  ondansetron (ZOFRAN-ODT) if needed  Rimegepant Sulfate (NURTEC) if needed sertraline (ZOLOFT)    As of today, STOP taking any Aspirin (unless otherwise instructed by your surgeon) Aleve, Naproxen, Ibuprofen, Motrin, Advil, Goody's, BC's, all herbal medications, fish oil, and all vitamins.           Do not wear jewelry or makeup. Do not wear lotions, powders, perfumes or deodorant. Do not shave 48 hours prior to surgery.   Do not bring valuables to the hospital. Do not wear nail polish, gel polish, artificial nails, or any other type of covering on natural nails (fingers and toes) If you have artificial nails or gel coating that need to be removed by a nail salon, please have this removed prior to surgery. Artificial nails or gel coating may interfere with anesthesia's ability to adequately monitor your vital signs.  [facility] is not responsible for any belongings or valuables.    Do NOT Smoke (Tobacco/Vaping)  24 hours prior to your procedure  If you use a CPAP at night, you may bring your mask for your overnight stay.   Contacts,  glasses, hearing aids, dentures or partials may not be worn into surgery, please bring cases for these belongings   For patients admitted to the hospital, discharge time will be determined by your treatment team.   Patients discharged the day of surgery will not be allowed to drive home, and someone needs to stay with them for 24 hours.   SURGICAL WAITING ROOM VISITATION Patients having surgery or a procedure may have no more than 2 support people in the waiting area - these visitors may rotate.   Children under the age of 35 must have an adult with them who is not the patient. If the patient needs to stay at the hospital during part of their recovery, the visitor guidelines for inpatient rooms apply. Pre-op nurse will coordinate an appropriate time for 1 support person to accompany patient in pre-op.  This support person may not rotate.   Please refer to [email or web address] for the visitor guidelines for Inpatients (after your surgery is over and you are in a regular room).    Special instructions:    Oral Hygiene is also important to reduce your risk of infection.  Remember - BRUSH YOUR TEETH THE MORNING OF SURGERY WITH YOUR REGULAR TOOTHPASTE   [facility]- Preparing For Surgery  Before surgery, you can play an important role. Because skin is not sterile, your skin needs to be as free of germs as possible. You can reduce the number of germs on your skin by washing with CHG (chlorahexidine gluconate) Soap before surgery.  CHG is an antiseptic cleaner which kills germs and bonds with the skin to continue killing germs even after washing.     Please do not use if you have an allergy to CHG or antibacterial soaps. If your skin becomes reddened/irritated stop using the CHG.  Do not shave (including legs and underarms) for at least 48 hours prior to first CHG shower. It is OK to shave your face.  Please follow these instructions  carefully.     Shower the [facility] SURGERY-Tuesday and the MORNING OF SURGERY-Wednesday with CHG Soap.   If you chose to wash your hair, wash your hair first as usual with your normal shampoo. After you shampoo, rinse your hair and body thoroughly to remove the shampoo.  Then [employer] and genitals (private parts) with your normal soap and rinse thoroughly to remove soap.  After that Use CHG Soap as you would any other liquid soap. You can apply CHG directly to the skin and wash gently with a scrungie or a clean washcloth.   Apply the CHG Soap to your body ONLY FROM THE NECK DOWN.  Do not use on open wounds or open sores. Avoid contact with your eyes, ears, mouth and genitals (private parts). Wash Face and genitals (private parts)  with your normal soap.   Wash thoroughly, paying special attention to the area where your surgery will be performed.  Thoroughly rinse your body with warm water from the neck down.  DO NOT shower/wash with your normal soap after using and rinsing off the CHG Soap.  Pat yourself dry with a CLEAN TOWEL.  Wear CLEAN PAJAMAS to bed the night before surgery  Place CLEAN SHEETS on your bed the night before your surgery  DO NOT SLEEP WITH PETS.   Day of Surgery:  Take a shower with CHG soap. Wear Clean/Comfortable clothing the morning of surgery Do not apply any deodorants/lotions.   Remember to brush your teeth WITH YOUR REGULAR TOOTHPASTE.    If you received a COVID test during your pre-op visit, it is requested that you wear a mask when out in public, stay away from anyone that may not be feeling well, and notify your surgeon if you develop symptoms. If you have been in contact with anyone that has 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 the last 10 days, please notify your surgeon.    Please read over the following fact sheets that you were given.

## 2022-11-17 NOTE — Progress Notes (Signed)
Surgical Instructions    Your procedure is scheduled on Wednesday, 11/24/22.  Report to [facility] Main Entrance "A" at 6:30 A.M., then check in with the Admitting office.  Call this number if you have problems the morning of surgery:  [phone]   If you have any questions prior to your surgery date call [phone]: Open Monday-Friday 8am-4pm If you experience any cold or flu symptoms such as cough, fever, chills, shortness of breath, etc. between now and your scheduled surgery, please notify us at the above number     Remember:  Do not eat or drink after midnight the night before your surgery-Tuesday.     Take these medicines the morning of surgery with A SIP OF WATER:  acetaminophen (TYLENOL) if needed baclofen (LIORESAL)  (FIORICET) if needed desloratadine (CLARINEX)  FLONASE Nasal Spray gabapentin (NEURONTIN)  omeprazole (PRILOSEC)  ondansetron (ZOFRAN-ODT) if needed  Rimegepant Sulfate (NURTEC) if needed sertraline (ZOLOFT)   Hold Jardiance for 72 hours prior to procedure.  Last dose will be on 11/20/22.  As of today, STOP taking any Aspirin (unless otherwise instructed by your surgeon) Aleve, Naproxen, Ibuprofen, Motrin, Advil, Goody's, BC's, all herbal medications, fish oil, and all vitamins.           Do not wear jewelry or makeup. Do not wear lotions, powders, perfumes or deodorant. Do not shave 48 hours prior to surgery.   Do not bring valuables to the hospital. Do not wear nail polish, gel polish, artificial nails, or any other type of covering on natural nails (fingers and toes) If you have artificial nails or gel coating that need to be removed by a nail salon, please have this removed prior to surgery. Artificial nails or gel coating may interfere with anesthesia's ability to adequately monitor your vital signs.  [facility] is not responsible for any belongings or valuables.    Do NOT Smoke (Tobacco/Vaping)  24 hours prior to your procedure  If you use a  CPAP at night, you may bring your mask for your overnight stay.   Contacts, glasses, hearing aids, dentures or partials may not be worn into surgery, please bring cases for these belongings   For patients admitted to the hospital, discharge time will be determined by your treatment team.   Patients discharged the day of surgery will not be allowed to drive home, and someone needs to stay with them for 24 hours.   SURGICAL WAITING ROOM VISITATION Patients having surgery or a procedure may have no more than 2 support people in the waiting area - these visitors may rotate.   Children under the age of 46 must have an adult with them who is not the patient. If the patient needs to stay at the hospital during part of their recovery, the visitor guidelines for inpatient rooms apply. Pre-op nurse will coordinate an appropriate time for 1 support person to accompany patient in pre-op.  This support person may not rotate.   Please refer to [email or web address] for the visitor guidelines for Inpatients (after your surgery is over and you are in a regular room).    Special instructions:    Oral Hygiene is also important to reduce your risk of infection.  Remember - BRUSH YOUR TEETH THE MORNING OF SURGERY WITH YOUR REGULAR TOOTHPASTE   [facility] [place]- Preparing For Surgery  Before surgery, you can play an important role. Because skin is not sterile, your skin needs to be as free of germs as possible. You can reduce the number  of germs on your skin by washing with CHG (chlorahexidine gluconate) Soap before surgery.  CHG is an antiseptic cleaner which kills germs and bonds with the skin to continue killing germs even after washing.     Please do not use if you have an allergy to CHG or antibacterial soaps. If your skin becomes reddened/irritated stop using the CHG.  Do not shave (including legs and underarms) for at least 48 hours prior to first CHG shower.  It is OK to shave your face.  Please follow these instructions carefully.     Shower the [facility] SURGERY-Tuesday and the MORNING OF SURGERY-Wednesday with CHG Soap.   If you chose to wash your hair, wash your hair first as usual with your normal shampoo. After you shampoo, rinse your hair and body thoroughly to remove the shampoo.  Then [employer] and genitals (private parts) with your normal soap and rinse thoroughly to remove soap.  After that Use CHG Soap as you would any other liquid soap. You can apply CHG directly to the skin and wash gently with a scrungie or a clean washcloth.   Apply the CHG Soap to your body ONLY FROM THE NECK DOWN.  Do not use on open wounds or open sores. Avoid contact with your eyes, ears, mouth and genitals (private parts). Wash Face and genitals (private parts)  with your normal soap.   Wash thoroughly, paying special attention to the area where your surgery will be performed.  Thoroughly rinse your body with warm water from the neck down.  DO NOT shower/wash with your normal soap after using and rinsing off the CHG Soap.  Pat yourself dry with a CLEAN TOWEL.  Wear CLEAN PAJAMAS to bed the night before surgery  Place CLEAN SHEETS on your bed the night before your surgery  DO NOT SLEEP WITH PETS.   Day of Surgery:  Take a shower with CHG soap. Wear Clean/Comfortable clothing the morning of surgery Do not apply any deodorants/lotions.   Remember to brush your teeth WITH YOUR REGULAR TOOTHPASTE.    If you received a COVID test during your pre-op visit, it is requested that you wear a mask when out in public, stay away from anyone that may not be feeling well, and notify your surgeon if you develop symptoms. If you have been in contact with anyone that has 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 the last 10 days, please notify your surgeon.    Please read over the following fact sheets that you were given.

## 2022-11-17 NOTE — Progress Notes (Signed)
PCP - Gilford Silvius, FNP Cardiologist - none Endocrinologist - Dr Talmage Nap Rheumatology - Sherron Ales, PA-C  Chest x-ray - n/a EKG - n/a Stress Test - 09/01/98 ECHO - n/a Cardiac Cath - n/a  ICD Pacemaker/Loop - n/a  Sleep Study -  Yes, 03/2012 CPAP - does not use CPAP  Diabetes Type  Hold Jardiance for 72 hours prior to procedure.  Last dose will be on 11/20/22.  If your blood sugar is less than 70 mg/dL, you will need to treat for low blood sugar: Treat a low blood sugar (less than 70 mg/dL) with  cup of clear juice (cranberry or apple), 4 glucose tablets, OR glucose gel. Recheck blood sugar in 15 minutes after treatment (to make sure it is greater than 70 mg/dL). If your blood sugar is not greater than 70 mg/dL on recheck, call [phone] for further instructions.  Aspirin Instructions: Follow your surgeon's instructions on when to stop aspirin prior to surgery,  If no instructions were given by your surgeon then you will need to call the office for those instructions.  NPO  STOP now taking any Aspirin (unless otherwise instructed by your surgeon), Aleve, Naproxen, Ibuprofen, Motrin, Advil, Goody's, BC's, all herbal medications, fish oil, and all vitamins.   Coronavirus Screening Do you have any of the following symptoms:  Cough yes/no: No Fever (>100.83F)  yes/no: No Runny nose yes/no: No Sore throat yes/no: No Difficulty breathing/shortness of breath  yes/no: No  Have you traveled in the last 14 days and where? yes/no: No  Patient verbalized understanding of instructions that were given to them at the PAT appointment. Patient was also instructed that they will need to review over the PAT instructions again at home before surgery.

## 2022-11-17 NOTE — Patient Instructions (Signed)
Standing Labs We placed an order today for your standing lab work.   Please have your standing labs drawn in mid-October and every 3 months   Please have your labs drawn 2 weeks prior to your appointment so that the provider can discuss your lab results at your appointment, if possible.  Please note that you may see your imaging and lab results in MyChart before we have reviewed them. We will contact you once all results are reviewed. Please allow our office up to 72 hours to thoroughly review all of the results before contacting the office for clarification of your results.  WALK-IN LAB HOURS  Monday through Thursday from 8:00 am -12:30 pm and 1:00 pm-5:00 pm and Friday from 8:00 am-12:00 pm.  Patients with office visits requiring labs will be seen before walk-in labs.  You may encounter longer than normal wait times. Please allow additional time. Wait times may be shorter on  Monday and Thursday afternoons.  We do not book appointments for walk-in labs. We appreciate your patience and understanding with our staff.   Labs are drawn by [facility]. Please bring your co-pay at the time of your lab draw.  You may receive a bill from [facility] for your lab work.  Please note if you are on Hydroxychloroquine and and an order has been placed for a Hydroxychloroquine level,  you will need to have it drawn 4 hours or more after your last dose.  If you wish to have your labs drawn at another location, please call the office 24 hours in advance so we can fax the orders.  The office is located at [address], [place], [place] [address]   If you have any questions regarding directions or hours of operation,  please call [phone].   As a reminder, please drink plenty of water prior to coming for your lab work. Thanks!

## 2022-11-18 ENCOUNTER — Other Ambulatory Visit: Payer: Self-pay | Admitting: Rheumatology

## 2022-11-18 ENCOUNTER — Ambulatory Visit (HOSPITAL_COMMUNITY)
Admission: RE | Admit: 2022-11-18 | Discharge: 2022-11-18 | Disposition: A | Discharge status: Home / Self Care | Payer: Medicare PPO | Source: Ambulatory Visit | Attending: Otolaryngology | Admitting: Otolaryngology

## 2022-11-18 DIAGNOSIS — J329 Chronic sinusitis, unspecified: Secondary | ICD-10-CM | POA: Diagnosis not present

## 2022-11-18 NOTE — Telephone Encounter (Signed)
Last Fill: 10/18/2022  Next Visit: 02/17/2023  Last Visit: 11/17/2022  Dx: Fibromyalgia   Current Dose per office note on 11/17/2022: not discussed  Okay to refill Baclofen?

## 2022-11-23 NOTE — Anesthesia Preprocedure Evaluation (Addendum)
Anesthesia Evaluation  Patient identified by MRN, date of birth, ID band Patient awake    Reviewed: Allergy & Precautions, NPO status , Patient's Chart, lab work & pertinent test results  History of Anesthesia Complications (+) PONV and history of anesthetic complications  Airway Mallampati: III  TM Distance: >3 FB Neck ROM: Full    Dental  (+) Dental Advisory Given, Teeth Intact   Pulmonary sleep apnea (intolerant of CPAP)    Pulmonary exam normal breath sounds clear to auscultation       Cardiovascular negative cardio ROS Normal cardiovascular exam Rhythm:Regular Rate:Normal     Neuro/Psych  Headaches PSYCHIATRIC DISORDERS Anxiety Depression       GI/Hepatic Neg liver ROS,GERD  Medicated and Controlled,,  Endo/Other  diabetes, Well Controlled, Type 2, Oral Hypoglycemic Agents  A1c 6.7  Renal/GU negative Renal ROS  negative genitourinary   Musculoskeletal  (+) Arthritis , Rheumatoid disorders,  Fibromyalgia -  Abdominal   Peds  Hematology negative hematology ROS (+) Hb 14, plt 317   Anesthesia Other Findings Day of surgery medications reviewed with patient.  Reproductive/Obstetrics negative OB ROS                             Anesthesia Physical Anesthesia Plan  ASA: 2  Anesthesia Plan: General   Post-op Pain Management:    Induction: Intravenous  PONV Risk Score and Plan: Treatment may vary due to age or medical condition  Airway Management Planned: Oral ETT and Video Laryngoscope Planned  Additional Equipment: None  Intra-op Plan:   Post-operative Plan: Extubation in OR  Informed Consent: I have reviewed the patients History and Physical, chart, labs and discussed the procedure including the risks, benefits and alternatives for the proposed anesthesia with the patient or authorized representative who has indicated his/her understanding and acceptance.     Dental  advisory given  Plan Discussed with: CRNA  Anesthesia Plan Comments: (Last airway note 2021: Laryngoscope Size: Mac and 3 Grade View: Grade III Tube type: Oral Tube size: 7.0 mm Number of attempts: 1 Airway Equipment and Method: Stylet and Oral airway )       Anesthesia Quick Evaluation

## 2022-11-24 ENCOUNTER — Ambulatory Visit (HOSPITAL_COMMUNITY)
Admission: RE | Admit: 2022-11-24 | Discharge: 2022-11-24 | Disposition: A | Discharge status: Home / Self Care | Payer: Medicare PPO | Attending: Otolaryngology | Admitting: Otolaryngology

## 2022-11-24 ENCOUNTER — Ambulatory Visit (HOSPITAL_COMMUNITY): Payer: Medicare PPO

## 2022-11-24 ENCOUNTER — Other Ambulatory Visit: Payer: Self-pay

## 2022-11-24 ENCOUNTER — Encounter (HOSPITAL_COMMUNITY)
Admission: RE | Disposition: A | Discharge status: Home / Self Care | Payer: Self-pay | Source: Home / Self Care | Attending: Otolaryngology

## 2022-11-24 ENCOUNTER — Ambulatory Visit (HOSPITAL_BASED_OUTPATIENT_CLINIC_OR_DEPARTMENT_OTHER): Payer: Self-pay | Admitting: Anesthesiology

## 2022-11-24 ENCOUNTER — Encounter (HOSPITAL_COMMUNITY): Payer: Self-pay | Admitting: *Deleted

## 2022-11-24 ENCOUNTER — Ambulatory Visit (HOSPITAL_COMMUNITY): Payer: Medicare PPO | Admitting: Anesthesiology

## 2022-11-24 DIAGNOSIS — Z7984 Long term (current) use of oral hypoglycemic drugs: Secondary | ICD-10-CM | POA: Diagnosis not present

## 2022-11-24 DIAGNOSIS — T797XXA Traumatic subcutaneous emphysema, initial encounter: Secondary | ICD-10-CM | POA: Diagnosis not present

## 2022-11-24 DIAGNOSIS — Z7722 Contact with and (suspected) exposure to environmental tobacco smoke (acute) (chronic): Secondary | ICD-10-CM | POA: Insufficient documentation

## 2022-11-24 DIAGNOSIS — Z79631 Long term (current) use of antimetabolite agent: Secondary | ICD-10-CM | POA: Diagnosis not present

## 2022-11-24 DIAGNOSIS — Z8616 Personal history of COVID-19: Secondary | ICD-10-CM | POA: Insufficient documentation

## 2022-11-24 DIAGNOSIS — Z6828 Body mass index (BMI) 28.0-28.9, adult: Secondary | ICD-10-CM | POA: Insufficient documentation

## 2022-11-24 DIAGNOSIS — Z79899 Other long term (current) drug therapy: Secondary | ICD-10-CM | POA: Diagnosis not present

## 2022-11-24 DIAGNOSIS — E119 Type 2 diabetes mellitus without complications: Secondary | ICD-10-CM | POA: Diagnosis not present

## 2022-11-24 DIAGNOSIS — G4733 Obstructive sleep apnea (adult) (pediatric): Secondary | ICD-10-CM | POA: Insufficient documentation

## 2022-11-24 DIAGNOSIS — E663 Overweight: Secondary | ICD-10-CM | POA: Insufficient documentation

## 2022-11-24 DIAGNOSIS — R918 Other nonspecific abnormal finding of lung field: Secondary | ICD-10-CM | POA: Diagnosis not present

## 2022-11-24 DIAGNOSIS — M47812 Spondylosis without myelopathy or radiculopathy, cervical region: Secondary | ICD-10-CM | POA: Diagnosis not present

## 2022-11-24 DIAGNOSIS — M069 Rheumatoid arthritis, unspecified: Secondary | ICD-10-CM | POA: Insufficient documentation

## 2022-11-24 DIAGNOSIS — G43909 Migraine, unspecified, not intractable, without status migrainosus: Secondary | ICD-10-CM | POA: Insufficient documentation

## 2022-11-24 DIAGNOSIS — K219 Gastro-esophageal reflux disease without esophagitis: Secondary | ICD-10-CM | POA: Insufficient documentation

## 2022-11-24 DIAGNOSIS — Z7969 Long term (current) use of other immunomodulators and immunosuppressants: Secondary | ICD-10-CM | POA: Diagnosis not present

## 2022-11-24 DIAGNOSIS — G4730 Sleep apnea, unspecified: Secondary | ICD-10-CM | POA: Diagnosis not present

## 2022-11-24 HISTORY — PX: IMPLANTATION OF HYPOGLOSSAL NERVE STIMULATOR: SHX6827

## 2022-11-24 LAB — GLUCOSE, CAPILLARY
Glucose-Capillary: 114 mg/dL — ABNORMAL HIGH (ref 70–99)
Glucose-Capillary: 134 mg/dL — ABNORMAL HIGH (ref 70–99)

## 2022-11-24 SURGERY — INSERTION, HYPOGLOSSAL NERVE STIMULATOR
Anesthesia: General | Site: Neck | Laterality: Right

## 2022-11-24 MED ORDER — OXYCODONE HCL 5 MG/5ML PO SOLN
ORAL | Status: AC
  Filled 2022-11-24: qty 5

## 2022-11-24 MED ORDER — AMISULPRIDE (ANTIEMETIC) 5 MG/2ML IV SOLN: 10.0000 mg | Freq: Once | INTRAVENOUS | Status: DC | PRN

## 2022-11-24 MED ORDER — OXYCODONE HCL 5 MG PO TABS
5.0000 mg | ORAL_TABLET | ORAL | 0 refills | Status: DC | PRN
Start: 2022-11-24 — End: 2022-11-30

## 2022-11-24 MED ORDER — OXYCODONE HCL 5 MG/5ML PO SOLN
5.0000 mg | Freq: Once | ORAL | Status: AC
  Administered 2022-11-24: 5 mg via ORAL

## 2022-11-24 MED ORDER — ACETAMINOPHEN 500 MG PO TABS
1000.0000 mg | ORAL_TABLET | Freq: Once | ORAL | Status: AC
  Administered 2022-11-24: 1000 mg via ORAL
  Filled 2022-11-24: qty 2

## 2022-11-24 MED ORDER — LIDOCAINE 2% (20 MG/ML) 5 ML SYRINGE
INTRAMUSCULAR | Status: DC | PRN
  Administered 2022-11-24: 100 mg via INTRAVENOUS

## 2022-11-24 MED ORDER — FENTANYL CITRATE (PF) 250 MCG/5ML IJ SOLN
INTRAMUSCULAR | Status: DC | PRN
  Administered 2022-11-24: 50 ug via INTRAVENOUS
  Administered 2022-11-24: 25 ug via INTRAVENOUS
  Administered 2022-11-24: 100 ug via INTRAVENOUS

## 2022-11-24 MED ORDER — OXYCODONE HCL 5 MG/5ML PO SOLN
5.0000 mg | Freq: Once | ORAL | Status: AC | PRN
  Administered 2022-11-24: 5 mg via ORAL

## 2022-11-24 MED ORDER — SCOPOLAMINE 1 MG/3DAYS TD PT72
MEDICATED_PATCH | TRANSDERMAL | Status: AC
  Filled 2022-11-24: qty 1

## 2022-11-24 MED ORDER — SCOPOLAMINE 1 MG/3DAYS TD PT72: 1.0000 | MEDICATED_PATCH | TRANSDERMAL | Status: DC

## 2022-11-24 MED ORDER — LIDOCAINE-EPINEPHRINE 1 %-1:100000 IJ SOLN
INTRAMUSCULAR | Status: AC
  Filled 2022-11-24: qty 1

## 2022-11-24 MED ORDER — SODIUM CHLORIDE 0.9 % IV SOLN
Freq: Once | INTRAVENOUS | Status: AC
  Filled 2022-11-24: qty 1000

## 2022-11-24 MED ORDER — EPHEDRINE SULFATE-NACL 50-0.9 MG/10ML-% IV SOSY
PREFILLED_SYRINGE | INTRAVENOUS | Status: DC | PRN
  Administered 2022-11-24: 10 mg via INTRAVENOUS

## 2022-11-24 MED ORDER — CHLORHEXIDINE GLUCONATE 0.12 % MT SOLN: 15.0000 mL | Freq: Once | OROMUCOSAL | Status: AC

## 2022-11-24 MED ORDER — ORAL CARE MOUTH RINSE: 15.0000 mL | Freq: Once | OROMUCOSAL | Status: AC

## 2022-11-24 MED ORDER — PROPOFOL 10 MG/ML IV BOLUS
INTRAVENOUS | Status: DC | PRN
  Administered 2022-11-24: 150 mg via INTRAVENOUS
  Administered 2022-11-24: 25 ug/kg/min via INTRAVENOUS

## 2022-11-24 MED ORDER — DEXAMETHASONE SODIUM PHOSPHATE 10 MG/ML IJ SOLN
INTRAMUSCULAR | Status: AC
  Filled 2022-11-24: qty 1

## 2022-11-24 MED ORDER — HYDROMORPHONE HCL 1 MG/ML IJ SOLN: 0.2500 mg | INTRAMUSCULAR | Status: DC | PRN

## 2022-11-24 MED ORDER — SODIUM CHLORIDE 0.9 % IV SOLN
3.0000 g | INTRAVENOUS | Status: AC
  Administered 2022-11-24: 3 g via INTRAVENOUS
  Filled 2022-11-24 (×2): qty 8

## 2022-11-24 MED ORDER — DEXMEDETOMIDINE HCL IN NACL 80 MCG/20ML IV SOLN
INTRAVENOUS | Status: AC
  Filled 2022-11-24: qty 20

## 2022-11-24 MED ORDER — LACTATED RINGERS IV SOLN: INTRAVENOUS | Status: DC

## 2022-11-24 MED ORDER — DEXAMETHASONE SODIUM PHOSPHATE 10 MG/ML IJ SOLN
INTRAMUSCULAR | Status: DC | PRN
  Administered 2022-11-24: 10 mg via INTRAVENOUS

## 2022-11-24 MED ORDER — ONDANSETRON HCL 4 MG/2ML IJ SOLN: 4.0000 mg | Freq: Once | INTRAMUSCULAR | Status: DC | PRN

## 2022-11-24 MED ORDER — CHLORHEXIDINE GLUCONATE 0.12 % MT SOLN
OROMUCOSAL | Status: AC
  Administered 2022-11-24: 15 mL via OROMUCOSAL
  Filled 2022-11-24: qty 15

## 2022-11-24 MED ORDER — LIDOCAINE-EPINEPHRINE 1 %-1:100000 IJ SOLN
INTRAMUSCULAR | Status: DC | PRN
  Administered 2022-11-24: 10 mL

## 2022-11-24 MED ORDER — SCOPOLAMINE 1 MG/3DAYS TD PT72
MEDICATED_PATCH | TRANSDERMAL | Status: AC
  Administered 2022-11-24: 1.5 mg via TRANSDERMAL
  Filled 2022-11-24: qty 1

## 2022-11-24 MED ORDER — INSULIN ASPART 100 UNIT/ML IJ SOLN: 0.0000 [IU] | INTRAMUSCULAR | Status: DC | PRN

## 2022-11-24 MED ORDER — AMOXICILLIN-POT CLAVULANATE 875-125 MG PO TABS: 1.0000 | ORAL_TABLET | Freq: Two times a day (BID) | ORAL | 0 refills | Status: DC

## 2022-11-24 MED ORDER — OXYCODONE HCL 5 MG PO TABS: 5.0000 mg | ORAL_TABLET | Freq: Once | ORAL | Status: AC | PRN

## 2022-11-24 MED ORDER — 0.9 % SODIUM CHLORIDE (POUR BTL) OPTIME
TOPICAL | Status: DC | PRN
  Administered 2022-11-24: 1000 mL

## 2022-11-24 MED ORDER — MIDAZOLAM HCL 2 MG/2ML IJ SOLN
INTRAMUSCULAR | Status: DC | PRN
  Administered 2022-11-24: 2 mg via INTRAVENOUS

## 2022-11-24 MED ORDER — SUCCINYLCHOLINE CHLORIDE 200 MG/10ML IV SOSY
PREFILLED_SYRINGE | INTRAVENOUS | Status: DC | PRN
  Administered 2022-11-24: 100 mg via INTRAVENOUS

## 2022-11-24 MED ORDER — PROPOFOL 10 MG/ML IV BOLUS
INTRAVENOUS | Status: AC
  Filled 2022-11-24: qty 20

## 2022-11-24 MED ORDER — ONDANSETRON HCL 4 MG/2ML IJ SOLN
INTRAMUSCULAR | Status: DC | PRN
  Administered 2022-11-24: 4 mg via INTRAVENOUS

## 2022-11-24 MED ORDER — FENTANYL CITRATE (PF) 250 MCG/5ML IJ SOLN
INTRAMUSCULAR | Status: AC
  Filled 2022-11-24: qty 5

## 2022-11-24 MED ORDER — MIDAZOLAM HCL 2 MG/2ML IJ SOLN
INTRAMUSCULAR | Status: AC
  Filled 2022-11-24: qty 2

## 2022-11-24 MED ORDER — ONDANSETRON HCL 4 MG/2ML IJ SOLN
INTRAMUSCULAR | Status: AC
  Filled 2022-11-24: qty 2

## 2022-11-24 SURGICAL SUPPLY — 76 items
ACC NRSTM 4 TRQ WRNCH STRL (MISCELLANEOUS)
ADH SKN CLS APL DERMABOND .7 (GAUZE/BANDAGES/DRESSINGS) ×2
BAG COUNTER SPONGE SURGICOUNT (BAG) ×1 IMPLANT
BAG SPNG CNTER NS LX DISP (BAG) ×1
BLADE CLIPPER SURG (BLADE) IMPLANT
BLADE SURG 15 STRL LF DISP TIS (BLADE) ×3 IMPLANT
BLADE SURG 15 STRL SS (BLADE) ×3
CANISTER SUCT 3000ML PPV (MISCELLANEOUS) ×1 IMPLANT
CLIP TI WIDE RED SMALL 6 (CLIP) IMPLANT
CORD BIPOLAR FORCEPS 12FT (ELECTRODE) ×1 IMPLANT
COVER PROBE W GEL 5X96 (DRAPES) ×1 IMPLANT
COVER SURGICAL LIGHT HANDLE (MISCELLANEOUS) ×1 IMPLANT
DERMABOND ADVANCED .7 DNX12 (GAUZE/BANDAGES/DRESSINGS) ×2 IMPLANT
DRAPE C-ARM 35X43 STRL (DRAPES) ×1 IMPLANT
DRAPE HEAD BAR (DRAPES) ×1 IMPLANT
DRAPE INCISE IOBAN 66X45 STRL (DRAPES) ×1 IMPLANT
DRAPE MICROSCOPE LEICA 54X105 (DRAPES) ×1 IMPLANT
DRAPE UTILITY XL STRL (DRAPES) ×1 IMPLANT
DRSG TEGADERM 4X4.75 (GAUZE/BANDAGES/DRESSINGS) ×3 IMPLANT
ELECT COATED BLADE 2.86 ST (ELECTRODE) ×1 IMPLANT
ELECT EMG 18 NIMS (NEUROSURGERY SUPPLIES) ×1
ELECT REM PT RETURN 9FT ADLT (ELECTROSURGICAL) ×1
ELECTRODE EMG 18 NIMS (NEUROSURGERY SUPPLIES) ×1 IMPLANT
ELECTRODE REM PT RTRN 9FT ADLT (ELECTROSURGICAL) ×1 IMPLANT
FORCEPS BIPOLAR SPETZLER 8 1.0 (NEUROSURGERY SUPPLIES) ×1 IMPLANT
GAUZE 4X4 16PLY ~~LOC~~+RFID DBL (SPONGE) ×1 IMPLANT
GAUZE SPONGE 4X4 12PLY STRL (GAUZE/BANDAGES/DRESSINGS) ×1 IMPLANT
GAUZE SPONGE 4X4 12PLY STRL LF (GAUZE/BANDAGES/DRESSINGS) IMPLANT
GENERATOR PULSE INSPIRE (Generator) ×1 IMPLANT
GENERATOR PULSE INSPIRE IV (Generator) ×1 IMPLANT
GLOVE BIO SURGEON STRL SZ 6 (GLOVE) ×1 IMPLANT
GLOVE BIO SURGEON STRL SZ7 (GLOVE) IMPLANT
GOWN STRL REUS W/ TWL LRG LVL3 (GOWN DISPOSABLE) ×3 IMPLANT
GOWN STRL REUS W/TWL LRG LVL3 (GOWN DISPOSABLE) ×3
IV CATH 18G X1.75 CATHLON (IV SOLUTION) IMPLANT
KIT BASIN OR (CUSTOM PROCEDURE TRAY) ×1 IMPLANT
KIT NEURO ACCESSORY W/WRENCH (MISCELLANEOUS) IMPLANT
KIT TURNOVER KIT B (KITS) ×1 IMPLANT
LEAD SENSING RESP INSPIRE (Lead) ×1 IMPLANT
LEAD SENSING RESP INSPIRE IV (Lead) ×1 IMPLANT
LEAD SLEEP STIM INSPIRE IV/V (Lead) ×1 IMPLANT
LEAD SLEEP STIMULATION INSPIRE (Lead) ×1 IMPLANT
LOOP VASCLR MAXI BLUE 18IN ST (MISCELLANEOUS) ×1 IMPLANT
LOOP VASCULAR MAXI 18 BLUE (MISCELLANEOUS) ×1
LOOP VASCULAR MINI 18 RED (MISCELLANEOUS) ×1
MARKER SKIN DUAL TIP RULER LAB (MISCELLANEOUS) ×2 IMPLANT
NDL HYPO 25GX1X1/2 BEV (NEEDLE) ×1 IMPLANT
NEEDLE HYPO 25GX1X1/2 BEV (NEEDLE) ×1
NS IRRIG 1000ML POUR BTL (IV SOLUTION) ×1 IMPLANT
PAD ARMBOARD 7.5X6 YLW CONV (MISCELLANEOUS) ×1 IMPLANT
PASSER CATH 38CM DISP (INSTRUMENTS) ×1 IMPLANT
PENCIL SMOKE EVACUATOR (MISCELLANEOUS) ×1 IMPLANT
POSITIONER HEAD DONUT 9IN (MISCELLANEOUS) ×1 IMPLANT
PROBE NERVE STIMULATOR (NEUROSURGERY SUPPLIES) ×1 IMPLANT
REMOTE CONTROL SLEEP INSPIRE (MISCELLANEOUS) ×1 IMPLANT
SET WALTER ACTIVATION W/DRAPE (SET/KITS/TRAYS/PACK) ×1 IMPLANT
SHEARS HARMONIC 9CM CVD (BLADE) IMPLANT
SPONGE INTESTINAL PEANUT (DISPOSABLE) ×1 IMPLANT
STAPLER VISISTAT 35W (STAPLE) ×1 IMPLANT
SUT MNCRL AB 4-0 PS2 18 (SUTURE) IMPLANT
SUT SILK 2 0 SH (SUTURE) ×1 IMPLANT
SUT SILK 3 0 REEL (SUTURE) ×1 IMPLANT
SUT SILK 3 0 SH 30 (SUTURE) ×2 IMPLANT
SUT SILK 3-0 (SUTURE) ×1
SUT SILK 3-0 RB1 30XBRD (SUTURE) ×1
SUT VIC AB 3-0 SH 27 (SUTURE) ×2
SUT VIC AB 3-0 SH 27X BRD (SUTURE) ×2 IMPLANT
SUT VIC AB 4-0 PS2 27 (SUTURE) ×2 IMPLANT
SUTURE SILK 3-0 RB1 30XBRD (SUTURE) ×1 IMPLANT
SYR 10ML LL (SYRINGE) ×1 IMPLANT
TAPE CLOTH 4X10 WHT NS (GAUZE/BANDAGES/DRESSINGS) IMPLANT
TAPE CLOTH SURG 4X10 WHT LF (GAUZE/BANDAGES/DRESSINGS) ×1 IMPLANT
TOWEL GREEN STERILE (TOWEL DISPOSABLE) ×1 IMPLANT
TRAY ENT MC OR (CUSTOM PROCEDURE TRAY) ×1 IMPLANT
VASCULAR TIE MAXI BLUE 18IN ST (MISCELLANEOUS) ×1
VASCULAR TIE MINI RED 18IN STL (MISCELLANEOUS) ×1 IMPLANT

## 2022-11-24 NOTE — Anesthesia Postprocedure Evaluation (Signed)
Anesthesia Post Note  Patient: Christina Lewis  Procedure(s) Performed: IMPLANTATION OF HYPOGLOSSAL NERVE STIMULATOR (Right: Neck)     Patient location during evaluation: PACU Anesthesia Type: General Level of consciousness: awake and alert, oriented and patient cooperative Pain management: pain level controlled Vital Signs Assessment: post-procedure vital signs reviewed and stable Respiratory status: spontaneous breathing, nonlabored ventilation and respiratory function stable Cardiovascular status: blood pressure returned to baseline and stable Postop Assessment: no apparent nausea or vomiting Anesthetic complications: no   There were no known notable events for this encounter.  Last Vitals:  Vitals:   11/24/22 1200 11/24/22 1215  BP: 127/73 132/78  Pulse: 81 79  Resp: 15 13  Temp:    SpO2: 94% 95%    Last Pain:  Vitals:   11/24/22 1200  TempSrc:   PainSc: 0-No pain                 Lannie Fields

## 2022-11-24 NOTE — Op Note (Addendum)
Operative Report                                                            SURGEON: Ashok Croon, MD  ASSISTANT: Eyvonne Mechanic, PA-C  PREOPERATIVE DIAGNOSIS: Obstructive sleep apnea. BMI of 28 (overweight)   POSTOPERATIVE DIAGNOSIS: Obstructive sleep apnea. BMI of 28 (overweight)  ANESTHESIA: General endotracheal.  ESTIMATED BLOOD LOSS: Less than 15 ml.  SPECIMENS: None.  DRAINS: None.  COMPLICATIONS: None.  Procedure: 12th cranial nerve (hypoglossal) stimulation implant along with placement of right pleural respiration sensor.  Electronic analysis of the implanted neurostimulator pulse generator system  Pre-Op Diagnosis: Moderate /Severe obstructive sleep apnea with positive airway pressure intolerance (ICD-10 G47.33). Post-Op Diagnosis: Moderate /Severe obstructive sleep apnea with positive pressure airway intolerance (ICD-10 G47.33).   Anesthesia: General endotracheal  Complications: None Brief Clinical History:     This is a 67 year old patient with a history of moderate /severe obstructive sleep apnea, who is intolerant and unable to achieve benefit from positive pressure therapy. Patient has passed the clinical, polysomnographic, and endoscopic screening criteria and presents today for the implant.    Procedure Description: The patient was brought to the Operating Room and was anesthetized via general endotracheal anesthesia without complication.  A shoulder roll was placed and the patient was prepped and draped in usual sterile fashion with the head turned to the left. Prior to prepping and draping, electrodes were placed in the genioglossus and styloglossus muscle and connected to the NIM box for intraoperative nerve monitoring.  A submental incision was made in the right upper neck approximately 2 cm below the mandible in the natural skin crease. Dissection was carried down through the subcutaneous tissue and platysma. The inferior border of the submandibular  gland was identified as well as the digastric tendon. The submandibular gland and the overlying fascia with the marginal mandibular nerve were retracted superiorly.  The digastric was retracted inferiorly.  Dissection was carried down into the digastric triangle where the hypoglossal nerve was identified in its usual fashion.   The mylohyoid muscle was retracted anteriorally, and the hypoglossal nerve was dissected up towards the floor of the mouth.  The lateral branches to retrusor muscles were identified, and tested intra-operatively using the NIM stimulator.  The cuff electrode for the hypoglossal nerve stimulator was placed distally to these branches on the medial nerve branch to the genioglossus muscle. Diagnostic evaluation confirmed activation of the genioglossus nerve, resulting in genioglossal activation and tongue protrusion, confirmed both visually and on NIM monitor. The stimulation electrode was then secured to the digastric tendon on its lateral surface with the provided anchor.  A second 5 cm incision was made in the right upper chest approximately 3 cm below the clavicle.  Dissection was carried down to the pectoralis muscle. An inferior pocket was created deep to the subcutaneous layer and superficial to the pectoralis major muscle.  In the upper portion of our chest pocket, pectoralis muscle fibers were then divided until we encountered anterior chest wall. External intercostal muscle was visualized and dissected until internal intercostal was visualized. Our respiratory sense lead was then tunneled in the fascial plane between external and internal intercostals. The distal anchor was secured to the anterior chest wall using 3-2.0 silk sutures and the proximal anchor was secured to the pec  major facscia.  The stimulation lead was then tunneled in a subplatysmal plane and brought out into the sub-clavicular pocket.  Pulse generator was then brought onto the field and the sense and stimulation  pins were both connected to the appropriate headers and secured using a torque screwdriver. The implantable pulse generator was placed in the subclavicular pocket and secured loosely to the pectoralis fascia using non-resorbable sutures.    Diagnostic evaluation of the system was run, confirming good respiratory wave from the sensing lead, good anterior tongue protrusion with stimulation, and normal impedence measurements.  All the wounds were thoroughly irrigated with bacitracin irrigation.  The wounds were then closed in multiple layers with deep Vicryl sutures and a 5-0 biosyn.  Dermabond was then applied to the skin.    The patient was then awakened, extubated, and transferred to Recovery Room in stable condition. All counts correct x2.  I was present for and performed the entire procedure.  The advanced practice practitioner (APP) assisted throughout the case. The APP was essential in retraction and counter traction when needed to make the case progress smoothly. This retraction and assistance made it possible to see the tissue plains for the procedure. The assistance was needed for tissue re-approximation and assisted with closure of the incision site.

## 2022-11-24 NOTE — Interval H&P Note (Signed)
History and Physical Interval Note:  11/24/2022 8:26 AM  Christina Lewis  has presented today for surgery, with the diagnosis of OSA.  The various methods of treatment have been discussed with the patient and family. After consideration of risks, benefits and other options for treatment, the patient has consented to  Procedure(s): IMPLANTATION OF HYPOGLOSSAL NERVE STIMULATOR (N/A) as a surgical intervention.  The patient's history has been reviewed, patient examined, no change in status, stable for surgery.  I have reviewed the patient's chart and labs.  Questions were answered to the patient's satisfaction.     Ashok Croon

## 2022-11-24 NOTE — Discharge Instructions (Signed)
INSPIRE POST-OPERATIVE INSTRUCTIONS:   Please review post-operative and recovery instructions below that you will need to be aware of after Inspire Implant surgery.  Please restart all of your home medications if you take anything on a daily basis.  You can resume regular diet after this procedure.  You will be scheduled for an appointment with Dr. Irene Pap to review details about surgery and to discuss the next steps.   DIET: Resume normal diet HYGIENE: Please wait until 48 hours after surgery before getting incisions on neck, chest, and torso wet. In the first 48 hours after surgery, will likely need to take sponge baths. WOUND CARE: Please leave pressure dressing on for 48 hours after surgery. Gently place antibiotic ointment over incisions 2 times per day; use clean q-tip. May place a clean bandage over incisions as needed. After 48 hours, you may get incisions wet with warm soap and water, but do not soak the incisions.  Pat area dry gently.  Immediately place antibiotic ointment. Take oral antibiotics as prescribed If skin around incision starts to get red (> 1cm), swollen, and/or more painful, please call the office ACTIVITY: Try to avoid sleeping on the side of your surgery, to the extent possible.   You may walk for exercise starting the day after surgery. For 2 weeks: Do not pick up anything greater than 5 pounds with the hand/arm that's on the same side as the surgery.  After 2 weeks, you may increase weight to 10 pounds.   Consider performing neck rolls 10 clockwise and 10 counterclockwise 3x/day. For 4 weeks, no strenuous activity (running, jogging, lifting weights, gardening, sports) or until cleared by physician.   PAIN MEDICATIONS: You will be prescribed Oxycodone for pain.   If pain is not severe, consider taking Tylenol 650mg  every 6 hours Avoid aspirin for 7 days after surgery Avoid direct heat (such as heating pads) to incision sites.   May gently place ice over  surgery sites as needed.  Please place a thin clean towel over skin first and then place ice bag over towel.  Ice for 10 minutes at a time only.  POST-OPERATIVE CLINIC APPOINTMENTS: 1 week: suture removal and wound check in the office.  1 month: device activation and wound check in the office. 2.5 months: check in visit to assess usage. 3-4 months: titration sleep study based on usage of >4 hrs/night.  4 months: final wound check in the office.  Yearly: device check at office.  SCAR CARE: After incisions have healed, you will have a scar, which will continue to evolve over the course of 12 months.  Caring for your incision scars will help them to be as minimal as possible. If you are out in the sun with incision exposure, please remember to place sunscreen over the incision and surrounding skin.   You may use vitamin E or "Scar ointment/cream" to help soften scar.  Please wait one month after surgery before starting this.

## 2022-11-24 NOTE — Transfer of Care (Signed)
Immediate Anesthesia Transfer of Care Note  Patient: Christina Lewis  Procedure(s) Performed: IMPLANTATION OF HYPOGLOSSAL NERVE STIMULATOR (Right: Neck)  Patient Location: PACU  Anesthesia Type:General  Level of Consciousness: awake, alert , and oriented  Airway & Oxygen Therapy: Patient Spontanous Breathing and Patient connected to face mask  Post-op Assessment: Report given to RN, Post -op Vital signs reviewed and stable, and Patient moving all extremities X 4  Post vital signs: Reviewed and stable  Last Vitals:  Vitals Value Taken Time  BP 125/68 11/24/22 1140  Temp    Pulse 87 11/24/22 1144  Resp 15 11/24/22 1144  SpO2 94 % 11/24/22 1144  Vitals shown include unfiled device data.  Last Pain:  Vitals:   11/24/22 0732  TempSrc:   PainSc: 0-No pain         Complications: There were no known notable events for this encounter.

## 2022-11-24 NOTE — Anesthesia Procedure Notes (Signed)
Procedure Name: Intubation Date/Time: 11/24/2022 8:52 AM  Performed by: Allyn Kenner, CRNAPre-anesthesia Checklist: Patient identified, Emergency Drugs available, Suction available and Patient being monitored Patient Re-evaluated:Patient Re-evaluated prior to induction Oxygen Delivery Method: Circle System Utilized Preoxygenation: Pre-oxygenation with 100% oxygen Induction Type: IV induction Ventilation: Mask ventilation without difficulty Laryngoscope Size: Glidescope and 3 Grade View: Grade I Tube type: Oral Tube size: 7.0 mm Number of attempts: 1 Airway Equipment and Method: Stylet and Oral airway Placement Confirmation: ETT inserted through vocal cords under direct vision, positive ETCO2 and breath sounds checked- equal and bilateral Tube secured with: Tape Dental Injury: Teeth and Oropharynx as per pre-operative assessment

## 2022-11-25 ENCOUNTER — Encounter (HOSPITAL_COMMUNITY): Payer: Self-pay | Admitting: Otolaryngology

## 2022-11-29 ENCOUNTER — Other Ambulatory Visit: Payer: Self-pay | Admitting: Physician Assistant

## 2022-11-29 NOTE — Telephone Encounter (Deleted)
ref

## 2022-11-30 ENCOUNTER — Encounter (INDEPENDENT_AMBULATORY_CARE_PROVIDER_SITE_OTHER): Payer: Self-pay | Admitting: Otolaryngology

## 2022-11-30 ENCOUNTER — Ambulatory Visit (INDEPENDENT_AMBULATORY_CARE_PROVIDER_SITE_OTHER): Payer: Medicare PPO | Admitting: Otolaryngology

## 2022-11-30 ENCOUNTER — Telehealth: Payer: Self-pay | Admitting: Otolaryngology

## 2022-11-30 VITALS — BP 135/82 | HR 76

## 2022-11-30 DIAGNOSIS — T85848A Pain due to other internal prosthetic devices, implants and grafts, initial encounter: Secondary | ICD-10-CM

## 2022-11-30 DIAGNOSIS — R070 Pain in throat: Secondary | ICD-10-CM | POA: Diagnosis not present

## 2022-11-30 DIAGNOSIS — Z6829 Body mass index (BMI) 29.0-29.9, adult: Secondary | ICD-10-CM

## 2022-11-30 DIAGNOSIS — G4733 Obstructive sleep apnea (adult) (pediatric): Secondary | ICD-10-CM

## 2022-11-30 DIAGNOSIS — J029 Acute pharyngitis, unspecified: Secondary | ICD-10-CM

## 2022-11-30 DIAGNOSIS — Z789 Other specified health status: Secondary | ICD-10-CM

## 2022-11-30 MED ORDER — METHYLPREDNISOLONE 4 MG PO TBPK: ORAL_TABLET | ORAL | 1 refills | Status: DC

## 2022-11-30 MED ORDER — OXYCODONE HCL 5 MG PO TABS
5.0000 mg | ORAL_TABLET | Freq: Four times a day (QID) | ORAL | 0 refills | Status: DC | PRN
Start: 2022-11-30 — End: 2023-02-01

## 2022-11-30 MED ORDER — AMOXICILLIN-POT CLAVULANATE 875-125 MG PO TABS: 1.0000 | ORAL_TABLET | Freq: Two times a day (BID) | ORAL | 0 refills | Status: DC

## 2022-11-30 NOTE — Telephone Encounter (Signed)
I have scheduled the patient for today at 1:15

## 2022-11-30 NOTE — Telephone Encounter (Signed)
Pt had sx last week 10/3, she says she is having major swelling and wants to been today. If possible

## 2022-11-30 NOTE — Patient Instructions (Addendum)
-   use Arnica products available on amaz[employer] for bruising  - use compression dressing under chin for a few days to help with swelling  - take Augmentin for 14 days (or until done with new script) - take steroid taper Medrol Pack  - return to see [place] as scheduled next week

## 2022-12-01 ENCOUNTER — Other Ambulatory Visit: Payer: Self-pay | Admitting: Physician Assistant

## 2022-12-01 NOTE — Progress Notes (Addendum)
ENT POST-OP Check   HPI: 67 year old female with history of OSA 1 week postop following inspire implant on the right side, here for wound check.  She reports some bruising along the anterior neck, and swelling around the right submental area where implant surgery was done.  She has pleuritic pain when taking deep breaths along the right upper chest where implant was placed.  On Tylenol and Motrin for pain right now as needed.  Denies we tongue weakness.  Denies dyspnea.  Reports persistent sore throat throat discomfort on the right side after the procedure.   PHYSICAL EXAM: BP 135/82 (BP Location: Right Arm, Patient Position: Sitting, Cuff Size: Normal)   Pulse 76   SpO2 97%   Exam: General: Well-developed, well-nourished Respiratory Respiratory effort: Equal inspiration and expiration without stridor Cardiovascular Peripheral Vascular: Warm extremities with equal color/perfusion Eyes: No nystagmus with equal extraocular motion bilaterally Neuro/Psych/Balance: Patient oriented to person, place, and time; Appropriate mood and affect; Gait is intact with no imbalance; Cranial nerves I-XII are intact Head and Face Inspection: Normocephalic and atraumatic without mass or lesion Palpation: Facial skeleton intact without bony stepoffs Salivary Glands: No mass or tenderness Facial Strength: Facial motility symmetric and full bilaterally ENT Pinna: External ear intact and fully developed External canal: Canal is patent with intact skin Tympanic Membrane: Clear and mobile External Nose: No scar or anatomic deformity Internal Nose: Septum is deviated to the left. No polyp, or purulence. Mucosal edema and erythema present.  Bilateral inferior turbinate hypertrophy.  Lips, Teeth, and gums: Mucosa and teeth intact and viable TMJ: No pain to palpation with full mobility Oral cavity/oropharynx: No erythema or exudate, no lesions present Nasopharynx: No mass or lesion with intact mucosa Hypopharynx:  Intact mucosa without pooling of secretions Larynx Glottic: Full true vocal cord mobility without lesion or mass Supraglottic: Normal appearing epiglottis and AE folds Interarytenoid Space: Moderate pachydermia edema Subglottic Space: Not seen well Neck Neck and Trachea: Midline trachea without mass or lesion.  Minimal bruising at the anterior aspect of the right submental incision, incision is clean dry intact Swelling around the area without fluctuance or palpable fluid collection/compression dressing applied Thyroid: No mass or nodularity Lymphatics: No lymphadenopathy Chest wall incision clean dry intact without palpable swelling or incision dehiscence Preoperative diagnosis: Throat discomfort postop  Postoperative diagnosis:   Same  Procedure: Flexible fiberoptic laryngoscopy  Surgeon: Ashok Croon, MD  Anesthesia: Topical lidocaine and Afrin Complications: None Condition is stable throughout exam  Indications and consent:  The patient presents to the clinic with Indirect laryngoscopy view was incomplete. Thus it was recommended that they undergo a flexible fiberoptic laryngoscopy. All of the risks, benefits, and potential complications were reviewed with the patient preoperatively and verbal informed consent was obtained.  Procedure: The patient was seated upright in the clinic. Topical lidocaine and Afrin were applied to the nasal cavity. After adequate anesthesia had occurred, I then proceeded to pass the flexible telescope into the nasal cavity. The nasal cavity was patent without rhinorrhea or polyp. The nasopharynx was also patent without mass or lesion. The base of tongue was visualized and was normal. There were no signs of pooling of secretions in the piriform sinuses. The true vocal folds were mobile bilaterally. There were no signs of glottic or supraglottic mucosal lesion or mass. There was moderate interarytenoid pachydermia and post cricoid edema. The telescope was  then slowly withdrawn and the patient tolerated the procedure throughout.  A/P Encounter Diagnoses  Name Primary?   Obstructive sleep  apnea Yes   Body mass index 29.0-29.9, adult    Intolerance of continuous positive airway pressure (CPAP) ventilation    Sore throat    Throat pain     OSA 1 week post-op following inspire implant surgery Exam with minimal swelling along the right submandibular area where implant surgery was done, no fluctuance, minimal bruising near the site of the incision along the anterior neck, no evidence of drainable collection on exam, compression dressing applied at the end of the visit  - use Arnica products available on amaz[employer] for bruising  - use compression dressing under chin for a few days to help with swelling  - take Augmentin 875-125 mg BID for 14 days (or until done with new script) for infection prophylaxis following the surgery - take steroid taper Medrol Pack to help with swelling - return to see [place] as scheduled next week   2.  Sore throat throat discomfort right side after the surgery -oral exam unremarkable flexible laryngoscopy without evidence of oropharyngeal abnormalities, pharyngeal structures appear normal without evidence of intubation injury. CN XII intact, normal tongue movement today  -Patient was reassured   3. Pain with deep breathing near the site of the implant placement -patient was reassured and we discussed that discomfort with deep breathing is expected due to the device placement near the intercostal muscles - Rx given for Oxycodone to take for pain as needed while taking scheduled Tylenol and Motrin for pain  RTC 1 week

## 2022-12-06 ENCOUNTER — Telehealth: Payer: Medicare PPO | Admitting: Physician Assistant

## 2022-12-06 ENCOUNTER — Other Ambulatory Visit: Payer: Self-pay | Admitting: Physician Assistant

## 2022-12-06 DIAGNOSIS — J019 Acute sinusitis, unspecified: Secondary | ICD-10-CM | POA: Diagnosis not present

## 2022-12-06 DIAGNOSIS — Z79899 Other long term (current) drug therapy: Secondary | ICD-10-CM

## 2022-12-06 DIAGNOSIS — B9689 Other specified bacterial agents as the cause of diseases classified elsewhere: Secondary | ICD-10-CM

## 2022-12-06 DIAGNOSIS — M0609 Rheumatoid arthritis without rheumatoid factor, multiple sites: Secondary | ICD-10-CM

## 2022-12-06 MED ORDER — DOXYCYCLINE HYCLATE 100 MG PO TABS
100.0000 mg | ORAL_TABLET | Freq: Two times a day (BID) | ORAL | 0 refills | Status: DC
Start: 2022-12-06 — End: 2022-12-15

## 2022-12-06 NOTE — Progress Notes (Signed)
Virtual Visit Consent   Christina Lewis, you are scheduled for a virtual visit with a [facility] provider today. Just as with appointments in the office, your consent must be obtained to participate. Your consent will be active for this visit and any virtual visit you may have with one of our providers in the next 365 days. If you have a MyChart account, a copy of this consent can be sent to you electronically.  As this is a virtual visit, video technology does not allow for your provider to perform a traditional examination. This may limit your provider's ability to fully assess your condition. If your provider identifies any concerns that need to be evaluated in person or the need to arrange testing (such as labs, EKG, etc.), we will make arrangements to do so. Although advances in technology are sophisticated, we cannot ensure that it will always work on either your end or our end. If the connection with a video visit is poor, the visit may have to be switched to a telephone visit. With either a video or telephone visit, we are not always able to ensure that we have a secure connection.  By engaging in this virtual visit, you consent to the provision of healthcare and authorize for your insurance to be billed (if applicable) for the services provided during this visit. Depending on your insurance coverage, you may receive a charge related to this service.  I need to obtain your verbal consent now. Are you willing to proceed with your visit today? Christina Lewis has provided verbal consent on 12/06/2022 for a virtual visit (video or telephone). Margaretann Loveless, PA-C  Date: 12/06/2022 7:12 PM  Virtual Visit via Video Note   I, Margaretann Loveless, connected with  Christina Lewis  ([id number], [birth year]) on 12/06/22 at  7:15 PM EDT by a video-enabled telemedicine application and verified that I am speaking with the correct person using two identifiers.  Location: Patient: Virtual Visit  Location Patient: Home Provider: Virtual Visit Location Provider: Home Office   I discussed the limitations of evaluation and management by telemedicine and the availability of in person appointments. The patient expressed understanding and agreed to proceed.    History of Present Illness: Christina Lewis is a 67 y.o. who identifies as a female who was assigned female at birth, and is being seen today for possible sinus infection.  HPI: Sinusitis This is a new problem. The current episode started 1 to 4 weeks ago (over a week ago). The problem has been gradually worsening since onset. There has been no fever. The pain is moderate. Associated symptoms include chills, coughing, headaches, sinus pressure and a sore throat (mild). Pertinent negatives include no congestion, diaphoresis, ear pain or hoarse voice. (Rhinorrhea, post nasal drainage) Past treatments include antibiotics and acetaminophen (Had Inspire implanted two weeks ago and has been on Augmentin as preventative). The treatment provided no relief.  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   Vitamin D deficiency 10/15/2021   Polycystic ovaries 10/15/2021   Migraine without aura and without status migrainosus, not intractable 10/15/2021   BMI 29.0-29.9,adult 10/15/2021   Cellulitis 03/04/2021   Rheumatoid arthritis (  HCC) 03/04/2021   Type 2 diabetes mellitus with obesity (HCC) 03/04/2021   Right foot infection 03/04/2021   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 myelopathy 09/11/2019   Leukocytosis 11/04/2016   Fibromyalgia 02/10/2016   Other fatigue 02/10/2016   Primary osteoarthritis of both  hands 02/10/2016   History of migraine 02/10/2016   History of thyroid nodule 02/10/2016   History of sleep apnea 02/10/2016   History of renal calculi 02/10/2016   Pain in joint, shoulder region 10/02/2010   Adhesive capsulitis of shoulder 10/02/2010   Muscle weakness (generalized) 10/02/2010    Allergies:  Allergies  Allergen Reactions   Sulfa Antibiotics Anaphylaxis   Sulfamethoxazole-Trimethoprim Anaphylaxis   Atorvastatin Other (See Comments)    Severe Muscle and Joint pain with ALL statins    Codeine Itching   Erythromycin Nausea And Vomiting   Metronidazole Diarrhea   Morphine And Codeine Itching   Semaglutide Nausea And Vomiting    Ozempic*   Medications:  Current Outpatient Medications:    acetaminophen (TYLENOL) 650 MG CR tablet, Take 1,300 mg by mouth every 8 (eight) hours as needed for pain., Disp: , Rfl:    amoxicillin-clavulanate (AUGMENTIN) 875-125 MG tablet, Take 1 tablet by mouth 2 (two) times daily., Disp: 20 tablet, Rfl: 0   antiseptic oral rinse (BIOTENE) LIQD, 15 mLs by Mouth Rinse route as needed for dry mouth., Disp: , Rfl:    aspirin EC 81 MG tablet, Take 81 mg by mouth daily. Swallow whole., Disp: , Rfl:    baclofen (LIORESAL) 10 MG tablet, TAKE 1 TABLET BY MOUTH EACH MORNING AND 1 TABLET AT NOON., Disp: 60 tablet, Rfl: 0   butalbital-acetaminophen-caffeine (FIORICET) 50-325-40 MG tablet, Take 1-2 tablets by mouth every 6 (six) hours as needed for headache., Disp: 42 tablet, Rfl: 0   cholecalciferol (VITAMIN D) 25 MCG (1000 UNIT) tablet, Take 1,000 Units by mouth daily., Disp: , Rfl:    desloratadine (CLARINEX) 5 MG tablet, Take 1 tablet (5 mg total) by mouth daily., Disp: 90 tablet, Rfl: 3   doxycycline (VIBRA-TABS) 100 MG tablet, Take 1 tablet (100 mg total) by mouth 2 (two) times daily., Disp: 20 tablet, Rfl: 0   Eptinezumab-jjmr (VYEPTI) 100 MG/ML injection, Inject into the vein every 3 (three) months., Disp: , Rfl:    ferrous sulfate 325 (65 FE) MG  tablet, Take 325 mg by mouth daily., Disp: , Rfl:    fluticasone (FLONASE ALLERGY RELIEF) 50 MCG/ACT nasal spray, Place 1 spray into both nostrils in the morning and at bedtime., Disp: , Rfl:    folic acid (FOLVITE) 1 MG tablet, Take 2 tablets (2 mg total) by mouth daily., Disp: 180 tablet, Rfl: 3   gabapentin (NEURONTIN) 300 MG capsule, Take 300 mg by mouth 3 (three) times daily., Disp: , Rfl:    hydroxychloroquine (PLAQUENIL) 200 MG tablet, TAKE 1 TABLET TWICE DAILY MONDAY THRU FRIDAY FOR RHEUMATOID ARTHRTITIS., Disp: 120 tablet, Rfl: 0   ibuprofen (ADVIL) 200 MG tablet, Take 400 mg by mouth every 8 (eight) hours as needed for moderate pain., Disp: , Rfl:    JARDIANCE 10 MG TABS tablet, Take 10 mg by mouth daily., Disp: , Rfl:    MAGNESIUM PO, Take 500 mg by mouth at bedtime., Disp: , Rfl:    Methotrexate, PF, (RASUVO) 20 MG/0.4ML SOAJ, Inject 20 mg into the skin once a week., Disp: 4.8 mL, Rfl: 0   methylPREDNISolone (MEDROL DOSEPAK) 4 MG TBPK tablet, Take with signs of chronic sinusitis and take as directed, Disp:  1 each, Rfl: 1   Multiple Vitamin (MULTIVITAMIN WITH MINERALS) TABS tablet, Take 1 tablet by mouth daily., Disp: , Rfl:    omeprazole (PRILOSEC) 20 MG capsule, Take 40 mg by mouth daily., Disp: , Rfl:    ondansetron (ZOFRAN-ODT) 8 MG disintegrating tablet, Take 8 mg by mouth every 8 (eight) hours as needed for nausea. , Disp: , Rfl:    oxyCODONE (ROXICODONE) 5 MG immediate release tablet, Take 1 tablet (5 mg total) by mouth every 6 (six) hours as needed for severe pain., Disp: 21 tablet, Rfl: 0   Polyvinyl Alcohol-Povidone (REFRESH OP), Apply to eye as needed., Disp: , Rfl:    Rimegepant Sulfate (NURTEC) 75 MG TBDP, Take 1 tablet (75 mg total) by mouth daily as needed. For migraines. Take as close to onset of migraine as possible. One daily maximum., Disp: 10 tablet, Rfl: 6   sertraline (ZOLOFT) 50 MG tablet, Take 1.5 tablets (75 mg total dose) daily., Disp: 45 tablet, Rfl: 2    tiZANidine (ZANAFLEX) 4 MG tablet, Take 1 tablet (4 mg total) by mouth at bedtime as needed., Disp: 30 tablet, Rfl: 0   TURMERIC-GINGER PO, Take 1 tablet by mouth daily., Disp: , Rfl:    urea (CARMOL) 10 % cream, Apply topically as needed., Disp: 71 g, Rfl: 0  Observations/Objective: Patient is well-developed, well-nourished in no acute distress.  Resting comfortably at home.  Head is normocephalic, atraumatic.  No labored breathing. Speech is clear and coherent with logical content.  Patient is alert and oriented at baseline.    Assessment and Plan: 1. Acute bacterial sinusitis - doxycycline (VIBRA-TABS) 100 MG tablet; Take 1 tablet (100 mg total) by mouth 2 (two) times daily.  Dispense: 20 tablet; Refill: 0  - Worsening symptoms that have not responded to OTC medications.  - Will give Doxycycline, STOP Augmentin - Continue allergy medications.  - Steam and humidifier can help - Stay well hydrated and get plenty of rest.  - Seek in person evaluation if no symptom improvement or if symptoms worsen   Follow Up Instructions: I discussed the assessment and treatment plan with the patient. The patient was provided an opportunity to ask questions and all were answered. The patient agreed with the plan and demonstrated an understanding of the instructions.  A copy of instructions were sent to the patient via MyChart unless otherwise noted below.    The patient was advised to call back or seek an in-person evaluation if the symptoms worsen or if the condition fails to improve as anticipated.    Margaretann Loveless, PA-C

## 2022-12-06 NOTE — Patient Instructions (Signed)
[name], thank you for joining Margaretann Loveless, PA-C for today's virtual visit.  While this provider is not your primary care provider (PCP), if your PCP is located in our provider database this encounter information will be shared with them immediately following your visit.   A [facility] MyChart account gives you access to today's visit and all your visits, tests, and labs performed at [facility] " click here if you don't have a [facility] MyChart account or go to [email or web address]  Consent: (Patient) Christina Lewis provided verbal consent for this virtual visit at the beginning of the encounter.  Current Medications:  Current Outpatient Medications:    acetaminophen (TYLENOL) 650 MG CR tablet, Take 1,300 mg by mouth every 8 (eight) hours as needed for pain., Disp: , Rfl:    amoxicillin-clavulanate (AUGMENTIN) 875-125 MG tablet, Take 1 tablet by mouth 2 (two) times daily., Disp: 20 tablet, Rfl: 0   antiseptic oral rinse (BIOTENE) LIQD, 15 mLs by Mouth Rinse route as needed for dry mouth., Disp: , Rfl:    aspirin EC 81 MG tablet, Take 81 mg by mouth daily. Swallow whole., Disp: , Rfl:    baclofen (LIORESAL) 10 MG tablet, TAKE 1 TABLET BY MOUTH EACH MORNING AND 1 TABLET AT NOON., Disp: 60 tablet, Rfl: 0   butalbital-acetaminophen-caffeine (FIORICET) 50-325-40 MG tablet, Take 1-2 tablets by mouth every 6 (six) hours as needed for headache., Disp: 42 tablet, Rfl: 0   cholecalciferol (VITAMIN D) 25 MCG (1000 UNIT) tablet, Take 1,000 Units by mouth daily., Disp: , Rfl:    desloratadine (CLARINEX) 5 MG tablet, Take 1 tablet (5 mg total) by mouth daily., Disp: 90 tablet, Rfl: 3   doxycycline (VIBRA-TABS) 100 MG tablet, Take 1 tablet (100 mg total) by mouth 2 (two) times daily., Disp: 20 tablet, Rfl: 0   Eptinezumab-jjmr (VYEPTI) 100 MG/ML injection, Inject into the vein every 3 (three) months., Disp: , Rfl:    ferrous sulfate 325 (65 FE) MG tablet, Take 325 mg by  mouth daily., Disp: , Rfl:    fluticasone (FLONASE ALLERGY RELIEF) 50 MCG/ACT nasal spray, Place 1 spray into both nostrils in the morning and at bedtime., Disp: , Rfl:    folic acid (FOLVITE) 1 MG tablet, Take 2 tablets (2 mg total) by mouth daily., Disp: 180 tablet, Rfl: 3   gabapentin (NEURONTIN) 300 MG capsule, Take 300 mg by mouth 3 (three) times daily., Disp: , Rfl:    hydroxychloroquine (PLAQUENIL) 200 MG tablet, TAKE 1 TABLET TWICE DAILY MONDAY THRU FRIDAY FOR RHEUMATOID ARTHRTITIS., Disp: 120 tablet, Rfl: 0   ibuprofen (ADVIL) 200 MG tablet, Take 400 mg by mouth every 8 (eight) hours as needed for moderate pain., Disp: , Rfl:    JARDIANCE 10 MG TABS tablet, Take 10 mg by mouth daily., Disp: , Rfl:    MAGNESIUM PO, Take 500 mg by mouth at bedtime., Disp: , Rfl:    Methotrexate, PF, (RASUVO) 20 MG/0.4ML SOAJ, Inject 20 mg into the skin once a week., Disp: 4.8 mL, Rfl: 0   methylPREDNISolone (MEDROL DOSEPAK) 4 MG TBPK tablet, Take with signs of chronic sinusitis and take as directed, Disp: 1 each, Rfl: 1   Multiple Vitamin (MULTIVITAMIN WITH MINERALS) TABS tablet, Take 1 tablet by mouth daily., Disp: , Rfl:    omeprazole (PRILOSEC) 20 MG capsule, Take 40 mg by mouth daily., Disp: , Rfl:    ondansetron (ZOFRAN-ODT) 8 MG disintegrating tablet, Take 8 mg by mouth every 8 (eight)  hours as needed for nausea. , Disp: , Rfl:    oxyCODONE (ROXICODONE) 5 MG immediate release tablet, Take 1 tablet (5 mg total) by mouth every 6 (six) hours as needed for severe pain., Disp: 21 tablet, Rfl: 0   Polyvinyl Alcohol-Povidone (REFRESH OP), Apply to eye as needed., Disp: , Rfl:    Rimegepant Sulfate (NURTEC) 75 MG TBDP, Take 1 tablet (75 mg total) by mouth daily as needed. For migraines. Take as close to onset of migraine as possible. One daily maximum., Disp: 10 tablet, Rfl: 6   sertraline (ZOLOFT) 50 MG tablet, Take 1.5 tablets (75 mg total dose) daily., Disp: 45 tablet, Rfl: 2   tiZANidine (ZANAFLEX) 4 MG  tablet, Take 1 tablet (4 mg total) by mouth at bedtime as needed., Disp: 30 tablet, Rfl: 0   TURMERIC-GINGER PO, Take 1 tablet by mouth daily., Disp: , Rfl:    urea (CARMOL) 10 % cream, Apply topically as needed., Disp: 71 g, Rfl: 0   Medications ordered in this encounter:  Meds ordered this encounter  Medications   doxycycline (VIBRA-TABS) 100 MG tablet    Sig: Take 1 tablet (100 mg total) by mouth 2 (two) times daily.    Dispense:  20 tablet    Refill:  0    Order Specific Question:   Supervising Provider    Answer:   Merrilee Jansky [id number]     *If you need refills on other medications prior to your next appointment, please contact your pharmacy*  Follow-Up: Call back or seek an in-person evaluation if the symptoms worsen or if the condition fails to improve as anticipated.  [facility] [phone]  Other Instructions STOP Augmentin START Doxycycline   If you have been instructed to have an in-person evaluation today at a local Urgent Care facility, please use the link below. It will take you to a list of all of our available [facility], including address, phone number and hours of operation. Please do not delay care.  [facility]  If you or a family member do not have a primary care provider, use the link below to schedule a visit and establish care. When you choose a [facility] primary care physician or advanced practice provider, you gain a long-term partner in health. Find a Primary Care Provider  Learn more about [facility]'s in-office and virtual care options: [facility] - Get Care Now

## 2022-12-07 ENCOUNTER — Other Ambulatory Visit (HOSPITAL_COMMUNITY): Payer: Self-pay

## 2022-12-07 ENCOUNTER — Telehealth (INDEPENDENT_AMBULATORY_CARE_PROVIDER_SITE_OTHER): Payer: Medicare PPO | Admitting: Adult Health

## 2022-12-07 ENCOUNTER — Other Ambulatory Visit: Payer: Self-pay

## 2022-12-07 DIAGNOSIS — G43709 Chronic migraine without aura, not intractable, without status migrainosus: Secondary | ICD-10-CM

## 2022-12-07 DIAGNOSIS — G43909 Migraine, unspecified, not intractable, without status migrainosus: Secondary | ICD-10-CM | POA: Diagnosis not present

## 2022-12-07 MED ORDER — RASUVO 20 MG/0.4ML ~~LOC~~ SOAJ
20.0000 mg | SUBCUTANEOUS | 0 refills | Status: DC
  Filled 2022-12-07: qty 4.8, 84d supply, fill #0

## 2022-12-07 NOTE — Telephone Encounter (Signed)
Last Fill: 09/13/2022  Labs: 09/09/2022 White cell count is elevated and stable.  CMP is normal.   Next Visit: 02/17/2023  Last Visit: 11/17/2022  DX: Rheumatoid arthritis of multiple sites with negative rheumatoid factor   Current Dose per office note 11/17/2022: Rasuvo 20 mg sq injections once weekly   Okay to refill Rasuvo?
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
## 2022-12-08 ENCOUNTER — Ambulatory Visit (INDEPENDENT_AMBULATORY_CARE_PROVIDER_SITE_OTHER): Payer: Medicare PPO | Admitting: Otolaryngology

## 2022-12-08 ENCOUNTER — Encounter (INDEPENDENT_AMBULATORY_CARE_PROVIDER_SITE_OTHER): Payer: Self-pay | Admitting: Otolaryngology

## 2022-12-08 ENCOUNTER — Other Ambulatory Visit: Payer: Self-pay

## 2022-12-08 VITALS — BP 121/77 | HR 67 | Temp 97.9°F | Wt 158.2 lb

## 2022-12-08 DIAGNOSIS — Z09 Encounter for follow-up examination after completed treatment for conditions other than malignant neoplasm: Secondary | ICD-10-CM

## 2022-12-08 DIAGNOSIS — Z6829 Body mass index (BMI) 29.0-29.9, adult: Secondary | ICD-10-CM

## 2022-12-08 DIAGNOSIS — Z789 Other specified health status: Secondary | ICD-10-CM

## 2022-12-08 DIAGNOSIS — G4733 Obstructive sleep apnea (adult) (pediatric): Secondary | ICD-10-CM

## 2022-12-08 NOTE — Progress Notes (Signed)
ENT POST-OP Check   Update 12/08/22 She returns for post-op check 2 weeks post-op. Reports that the swelling under her chin and over the implant right upper chest is better. Mild ecchymosis along the edges of the incision has resolved. Sore throat resolved.   Evaluation on 11/30/22  HPI: 67 year old female with history of OSA 1 week postop following inspire implant on the right side, here for wound check.  She reports some bruising along the anterior neck, and swelling around the right submental area where implant surgery was done.  She has pleuritic pain when taking deep breaths along the right upper chest where implant was placed.  On Tylenol and Motrin for pain right now as needed.  Denies weakness.  Denies dyspnea.  Reports persistent sore throat throat discomfort on the right side after the procedure.   PHYSICAL EXAM: BP 121/77 (BP Location: Right Arm, Patient Position: Sitting, Cuff Size: Normal)   Pulse 67   Temp 97.9 F (36.6 C) (Oral)   Wt 158 lb 3.2 oz (71.8 kg)   SpO2 97%   BMI 27.15 kg/m   Exam: General: Well-developed, well-nourished Respiratory Respiratory effort: Equal inspiration and expiration without stridor Cardiovascular Peripheral Vascular: Warm extremities with equal color/perfusion Eyes: No nystagmus with equal extraocular motion bilaterally Neuro/Psych/Balance: Patient oriented to person, place, and time; Appropriate mood and affect; Gait is intact with no imbalance; Cranial nerves I-XII are intact Head and Face Inspection: Normocephalic and atraumatic without mass or lesion Facial Strength: Facial motility symmetric and full bilaterally ENT Pinna: External ear intact and fully developed Lips, Teeth, and gums: Mucosa and teeth intact and viable Oral cavity/oropharynx: No erythema or exudate, no lesions present Neck Neck and Trachea: Midline trachea without mass or lesion. Right submental incision is clean dry intact Swelling around the area without fluctuance  or palpable fluid collection/compression dressing applied Thyroid: No mass or nodularity Lymphatics: No lymphadenopathy Chest wall incision clean dry intact without palpable swelling or incision dehiscence  A/P Encounter Diagnoses  Name Primary?   Body mass index 29.0-29.9, adult    Obstructive sleep apnea Yes   Intolerance of continuous positive airway pressure (CPAP) ventilation     Hx of OSA and CPAP intolerance 2 weeks post-op following inspire implant surgery. CN II - XII intact on exam.   Healing as expected. Swelling along incision is significantly better.   - proceed with activation appointment as scheduled She will return for f/u in 4 weeks    RTC 1 week

## 2022-12-09 ENCOUNTER — Ambulatory Visit: Payer: Medicare PPO | Admitting: Physician Assistant

## 2022-12-10 ENCOUNTER — Telehealth: Payer: Medicare PPO | Admitting: Family Medicine

## 2022-12-10 ENCOUNTER — Ambulatory Visit: Payer: Medicare PPO | Admitting: Physician Assistant

## 2022-12-10 DIAGNOSIS — J019 Acute sinusitis, unspecified: Secondary | ICD-10-CM | POA: Diagnosis not present

## 2022-12-10 DIAGNOSIS — B9689 Other specified bacterial agents as the cause of diseases classified elsewhere: Secondary | ICD-10-CM

## 2022-12-10 MED ORDER — LEVOFLOXACIN 500 MG PO TABS: 500.0000 mg | ORAL_TABLET | Freq: Every day | ORAL | 0 refills | Status: AC

## 2022-12-10 MED ORDER — PREDNISONE 20 MG PO TABS: 20.0000 mg | ORAL_TABLET | Freq: Two times a day (BID) | ORAL | 0 refills | Status: DC

## 2022-12-10 NOTE — Patient Instructions (Signed)

## 2022-12-10 NOTE — Progress Notes (Signed)
Virtual Visit Consent   Christina Lewis, you are scheduled for a virtual visit with a [facility] provider today. Just as with appointments in the office, your consent must be obtained to participate. Your consent will be active for this visit and any virtual visit you may have with one of our providers in the next 365 days. If you have a MyChart account, a copy of this consent can be sent to you electronically.  As this is a virtual visit, video technology does not allow for your provider to perform a traditional examination. This may limit your provider's ability to fully assess your condition. If your provider identifies any concerns that need to be evaluated in person or the need to arrange testing (such as labs, EKG, etc.), we will make arrangements to do so. Although advances in technology are sophisticated, we cannot ensure that it will always work on either your end or our end. If the connection with a video visit is poor, the visit may have to be switched to a telephone visit. With either a video or telephone visit, we are not always able to ensure that we have a secure connection.  By engaging in this virtual visit, you consent to the provision of healthcare and authorize for your insurance to be billed (if applicable) for the services provided during this visit. Depending on your insurance coverage, you may receive a charge related to this service.  I need to obtain your verbal consent now. Are you willing to proceed with your visit today? Christina Lewis has provided verbal consent on 12/10/2022 for a virtual visit (video or telephone). Georgana Curio, FNP  Date: 12/10/2022 11:31 AM  Virtual Visit via Video Note   I, Georgana Curio, connected with  Christina Lewis  ([id number], 11-07-1955) on 12/10/22 at 11:30 AM EDT by a video-enabled telemedicine application and verified that I am speaking with the correct person using two identifiers.  Location: Patient: Virtual Visit Location Patient:  Home Provider: Virtual Visit Location Provider: Home Office   I discussed the limitations of evaluation and management by telemedicine and the availability of in person appointments. The patient expressed understanding and agreed to proceed.    History of Present Illness: Christina Lewis is a 67 y.o. who identifies as a female who was assigned female at birth, and is being seen today for persistent sinus pain and pressure. Not responding to doxy. Green thick mucus. No fever. Cough at night. Marland Kitchen  HPI: HPI  Problems:  Patient Active Problem List   Diagnosis Date Noted   Chronic migraine without aura without status migrainosus, not intractable 04/26/2022   Dysphagia 10/15/2021   Hyperlipidemia associated with type 2 diabetes mellitus (HCC) 10/15/2021   Hyperglycemia due to type 2 diabetes mellitus (HCC) 10/15/2021   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   Vitamin D deficiency 10/15/2021   Polycystic ovaries 10/15/2021   Migraine without aura and without status migrainosus, not intractable 10/15/2021   BMI 29.0-29.9,adult 10/15/2021   Cellulitis 03/04/2021   Rheumatoid arthritis (HCC) 03/04/2021   Type 2 diabetes mellitus with obesity (HCC) 03/04/2021   Right foot infection 03/04/2021   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 myelopathy 09/11/2019   Leukocytosis 11/04/2016   Fibromyalgia 02/10/2016   Other fatigue 02/10/2016   Primary osteoarthritis of both hands 02/10/2016   History of migraine 02/10/2016   History of thyroid nodule 02/10/2016   History of sleep apnea 02/10/2016   History of renal calculi 02/10/2016   Pain in joint, shoulder region 10/02/2010  Adhesive capsulitis of shoulder 10/02/2010   Muscle weakness (generalized) 10/02/2010    Allergies:  Allergies  Allergen Reactions   Sulfa Antibiotics Anaphylaxis   Sulfamethoxazole-Trimethoprim Anaphylaxis   Atorvastatin Other (See Comments)    Severe Muscle and Joint pain with ALL statins    Codeine Itching   Erythromycin Nausea And  Vomiting   Metronidazole Diarrhea   Morphine And Codeine Itching   Semaglutide Nausea And Vomiting    Ozempic*   Medications:  Current Outpatient Medications:    levofloxacin (LEVAQUIN) 500 MG tablet, Take 1 tablet (500 mg total) by mouth daily for 10 days., Disp: 10 tablet, Rfl: 0   predniSONE (DELTASONE) 20 MG tablet, Take 1 tablet (20 mg total) by mouth 2 (two) times daily with a meal for 5 days., Disp: 10 tablet, Rfl: 0   acetaminophen (TYLENOL) 650 MG CR tablet, Take 1,300 mg by mouth every 8 (eight) hours as needed for pain., Disp: , Rfl:    amoxicillin-clavulanate (AUGMENTIN) 875-125 MG tablet, Take 1 tablet by mouth 2 (two) times daily. (Patient not taking: Reported on 12/08/2022), Disp: 20 tablet, Rfl: 0   antiseptic oral rinse (BIOTENE) LIQD, 15 mLs by Mouth Rinse route as needed for dry mouth., Disp: , Rfl:    aspirin EC 81 MG tablet, Take 81 mg by mouth daily. Swallow whole., Disp: , Rfl:    baclofen (LIORESAL) 10 MG tablet, TAKE 1 TABLET BY MOUTH EACH MORNING AND 1 TABLET AT NOON., Disp: 60 tablet, Rfl: 0   cholecalciferol (VITAMIN D) 25 MCG (1000 UNIT) tablet, Take 1,000 Units by mouth daily., Disp: , Rfl:    desloratadine (CLARINEX) 5 MG tablet, Take 1 tablet (5 mg total) by mouth daily., Disp: 90 tablet, Rfl: 3   doxycycline (VIBRA-TABS) 100 MG tablet, Take 1 tablet (100 mg total) by mouth 2 (two) times daily., Disp: 20 tablet, Rfl: 0   Eptinezumab-jjmr (VYEPTI) 100 MG/ML injection, Inject into the vein every 3 (three) months., Disp: , Rfl:    ferrous sulfate 325 (65 FE) MG tablet, Take 325 mg by mouth daily., Disp: , Rfl:    fluticasone (FLONASE ALLERGY RELIEF) 50 MCG/ACT nasal spray, Place 1 spray into both nostrils in the morning and at bedtime., Disp: , Rfl:    folic acid (FOLVITE) 1 MG tablet, Take 2 tablets (2 mg total) by mouth daily., Disp: 180 tablet, Rfl: 3   gabapentin (NEURONTIN) 300 MG capsule, Take 300 mg by mouth 3 (three) times daily., Disp: , Rfl:     hydroxychloroquine (PLAQUENIL) 200 MG tablet, TAKE 1 TABLET TWICE DAILY MONDAY THRU FRIDAY FOR RHEUMATOID ARTHRTITIS., Disp: 120 tablet, Rfl: 0   ibuprofen (ADVIL) 200 MG tablet, Take 400 mg by mouth every 8 (eight) hours as needed for moderate pain., Disp: , Rfl:    JARDIANCE 10 MG TABS tablet, Take 10 mg by mouth daily., Disp: , Rfl:    MAGNESIUM PO, Take 500 mg by mouth at bedtime., Disp: , Rfl:    Methotrexate, PF, (RASUVO) 20 MG/0.4ML SOAJ, Inject 20 mg into the skin once a week., Disp: 4.8 mL, Rfl: 0   methylPREDNISolone (MEDROL DOSEPAK) 4 MG TBPK tablet, Take with signs of chronic sinusitis and take as directed, Disp: 1 each, Rfl: 1   Multiple Vitamin (MULTIVITAMIN WITH MINERALS) TABS tablet, Take 1 tablet by mouth daily., Disp: , Rfl:    omeprazole (PRILOSEC) 20 MG capsule, Take 40 mg by mouth daily., Disp: , Rfl:    ondansetron (ZOFRAN-ODT) 8 MG disintegrating tablet, Take  8 mg by mouth every 8 (eight) hours as needed for nausea. , Disp: , Rfl:    oxyCODONE (ROXICODONE) 5 MG immediate release tablet, Take 1 tablet (5 mg total) by mouth every 6 (six) hours as needed for severe pain., Disp: 21 tablet, Rfl: 0   Polyvinyl Alcohol-Povidone (REFRESH OP), Apply to eye as needed., Disp: , Rfl:    Rimegepant Sulfate (NURTEC) 75 MG TBDP, Take 1 tablet (75 mg total) by mouth daily as needed. For migraines. Take as close to onset of migraine as possible. One daily maximum., Disp: 10 tablet, Rfl: 6   sertraline (ZOLOFT) 50 MG tablet, Take 1.5 tablets (75 mg total dose) daily., Disp: 45 tablet, Rfl: 2   tiZANidine (ZANAFLEX) 4 MG tablet, Take 1 tablet (4 mg total) by mouth at bedtime as needed., Disp: 30 tablet, Rfl: 0   TURMERIC-GINGER PO, Take 1 tablet by mouth daily., Disp: , Rfl:    urea (CARMOL) 10 % cream, Apply topically as needed., Disp: 71 g, Rfl: 0  Observations/Objective: Patient is well-developed, well-nourished in no acute distress.  Resting comfortably  at home.  Head is normocephalic,  atraumatic.  No labored breathing.  Speech is clear and coherent with logical content.  Patient is alert and oriented at baseline.    Assessment and Plan: 1. Acute bacterial sinusitis  Increase fluids, sudafed, flonase, and zyrtec. Stop doxycycline, UC or pcp if sx worsen.   Follow Up Instructions: I discussed the assessment and treatment plan with the patient. The patient was provided an opportunity to ask questions and all were answered. The patient agreed with the plan and demonstrated an understanding of the instructions.  A copy of instructions were sent to the patient via MyChart unless otherwise noted below.     The patient was advised to call back or seek an in-person evaluation if the symptoms worsen or if the condition fails to improve as anticipated.    Georgana Curio, FNP

## 2022-12-12 ENCOUNTER — Other Ambulatory Visit: Payer: Self-pay | Admitting: Rheumatology

## 2022-12-13 MED ORDER — TIZANIDINE HCL 4 MG PO TABS: 4.0000 mg | ORAL_TABLET | Freq: Every evening | ORAL | 0 refills | Status: DC | PRN

## 2022-12-13 MED ORDER — BACLOFEN 10 MG PO TABS: ORAL_TABLET | ORAL | 0 refills | Status: DC

## 2022-12-13 NOTE — Telephone Encounter (Signed)
Last Fill: 11/18/2022 (baclofen), 11/09/2022 (Tizanidine)    Next Visit: 02/17/2023   Last Visit: 11/17/2022   Dx: Fibromyalgia    Current Dose per office note on 11/17/2022: not discussed   Okay to refill Baclofen and Tizanidine?

## 2022-12-15 ENCOUNTER — Ambulatory Visit (INDEPENDENT_AMBULATORY_CARE_PROVIDER_SITE_OTHER): Payer: Medicare PPO | Admitting: Family Medicine

## 2022-12-15 ENCOUNTER — Telehealth: Payer: Medicare PPO | Admitting: Family Medicine

## 2022-12-15 ENCOUNTER — Encounter: Payer: Self-pay | Admitting: Family Medicine

## 2022-12-15 VITALS — BP 120/74 | HR 73 | Temp 97.2°F | Ht 64.0 in | Wt 159.0 lb

## 2022-12-15 DIAGNOSIS — J0141 Acute recurrent pansinusitis: Secondary | ICD-10-CM | POA: Diagnosis not present

## 2022-12-15 DIAGNOSIS — G4733 Obstructive sleep apnea (adult) (pediatric): Secondary | ICD-10-CM

## 2022-12-15 MED ORDER — AZELASTINE HCL 0.1 % NA SOLN
2.0000 | Freq: Two times a day (BID) | NASAL | 12 refills | Status: DC
Start: 2022-12-15 — End: 2023-02-01

## 2022-12-15 MED ORDER — CHLORPHEN-PE-ACETAMINOPHEN 4-10-325 MG PO TABS
1.0000 | ORAL_TABLET | Freq: Four times a day (QID) | ORAL | 0 refills | Status: DC | PRN
Start: 2022-12-15 — End: 2023-02-01

## 2022-12-15 MED ORDER — METHYLPREDNISOLONE ACETATE 40 MG/ML IJ SUSP
40.0000 mg | Freq: Once | INTRAMUSCULAR | Status: AC
Start: 2022-12-15 — End: 2022-12-15
  Administered 2022-12-15: 60 mg via INTRAMUSCULAR

## 2022-12-15 NOTE — Progress Notes (Signed)
Subjective:  Patient ID: Christina Lewis, female    DOB: 1955-10-12, 67 y.o.   MRN: [id number]  Patient Care Team: Sonny Masters, FNP as PCP - General (Family Medicine) Smitty Cords, OD (Optometry)   Chief Complaint:  Cough, Nasal Congestion, and Headache (X 2 weeks )   HPI: Christina Lewis is a 67 y.o. female presenting on 12/15/2022 for Cough, Nasal Congestion, and Headache (X 2 weeks )   Discussed the use of AI scribe software for clinical note transcription with the patient, who gave verbal consent to proceed.  History of Present Illness   The patient, with a recent history of Inspire implantation for sleep apnea, presents with persistent sinus issues. Despite prophylactic Augmentin post-surgery, she developed a severe sinus infection characterized by green nasal discharge. Initial treatment with Doxycycline from virtual urgent care was ineffective, leading to a switch to Levaquin. Concurrently, the patient was also given cortisone for a mild reaction to the implantation.  Despite being on Levaquin for several days, the patient continues to experience constant snorting and sniffing. Recently, she has also developed wheezing and chest pain which worsens with coughing, relieved with rest. No other associated symptoms. The patient has been managing symptoms with Mucinex, a decongestant in addition to the Levaquin. She has also been using Flonase and saline for nasal care.        Relevant past medical, surgical, family, and social history reviewed and updated as indicated.  Allergies and medications reviewed and updated. Data reviewed: Chart in Epic.   Past Medical History:  Diagnosis Date   Allergy    Anemia    Anxiety    Arthritis    Depression    Diabetes mellitus    type 2   Fibromyalgia    GERD (gastroesophageal reflux disease)    Gout    no current problem   Hematuria    History of kidney stones    passed stones and also had surgery to remove one   History  of tics    Ineffective esophageal motility    tx with prilosec, essophageal was stretched   Interstitial cystitis 2016   Migraines    Osteoarthritis    PCOS (polycystic ovarian syndrome) 1993   Pneumonia 2019   PONV (postoperative nausea and vomiting)    Sleep apnea    does not use CPAP   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, took medication for a year   Thyroid nodule    Dr Horald Pollen is monitoring every year    Past Surgical History:  Procedure Laterality Date   ABDOMINAL HYSTERECTOMY  2000   APPENDECTOMY  1976   BALLOON DILATION N/A 11/03/2021   Procedure: Marvis Repress DILATION;  Surgeon: Kerin Salen, MD;  Location: [facility];  Service: Gastroenterology;  Laterality: N/A;   BOTOX INJECTION N/A 11/03/2021   Procedure: BOTOX INJECTION;  Surgeon: Kerin Salen, MD;  Location: [facility];  Service: Gastroenterology;  Laterality: N/A;   BREAST LUMPECTOMY  1989   lt-negative   CARDIOVASCULAR STRESS TEST  2000   CATARACT EXTRACTION W/PHACO Left 12/07/2019   Procedure: CATARACT EXTRACTION PHACO AND INTRAOCULAR LENS PLACEMENT (IOC);  Surgeon: [name], MD;  Location: [facility];  Service: Ophthalmology;  Laterality: Left;  CDE: 5.55   CHOLECYSTECTOMY  1985   COLONOSCOPY     DRUG INDUCED ENDOSCOPY Bilateral 09/29/2022   Procedure: DRUG INDUCED ENDOSCOPY;  Surgeon: Ashok Croon, MD;  Location: [facility];  Service: ENT;  Laterality: Bilateral;  15 MINUTES   ESOPHAGEAL MANOMETRY N/A 08/24/2021   Procedure: ESOPHAGEAL MANOMETRY (EM);  Surgeon: Kerin Salen, MD;  Location: [facility];  Service: Gastroenterology;  Laterality: N/A;   ESOPHAGOGASTRODUODENOSCOPY N/A 11/03/2021   Procedure: ESOPHAGOGASTRODUODENOSCOPY (EGD);  Surgeon: Kerin Salen, MD;  Location: [facility];  Service: Gastroenterology;  Laterality: N/A;   HYSTERECTOMY ABDOMINAL WITH SALPINGECTOMY Bilateral 2000   IMPLANTATION OF HYPOGLOSSAL NERVE STIMULATOR Right 11/24/2022   Procedure: IMPLANTATION  OF HYPOGLOSSAL NERVE STIMULATOR;  Surgeon: Ashok Croon, MD;  Location: [facility];  Service: ENT;  Laterality: Right;   KIDNEY STONE SURGERY Right 2014   with stent placement in OR   KNEE ARTHROSCOPY Right 08/22/2013   Procedure: ARTHROSCOPY RIGHT KNEE FOR INFECTION LAVAGE AND DRAINAGE;  Surgeon: Thera Flake., MD;  Location: [facility];  Service: Orthopedics;  Laterality: Right;   KNEE ARTHROSCOPY WITH PATELLA RECONSTRUCTION Right 08/06/2013   Procedure: RIGHT KNEE ARTHROSCOPY WITH MENISCECTOMY MEDIAL, ARTHROSCOPY KNEE WITH DEBRIDEMENT/SHAVING (CHONDROPLASTY) ;  Surgeon: Thera Flake., MD;  Location: [facility];  Service: Orthopedics;  Laterality: Right;   LUMBAR LAMINECTOMY/DECOMPRESSION MICRODISCECTOMY Right 09/11/2019   Procedure: Right Lumbar Three-Four Microdiscectomy;  Surgeon: Maeola Harman, MD;  Location: [facility];  Service: Neurosurgery;  Laterality: Right;  Right Lumbar Three-Four Microdiscectomy   NASAL SINUS SURGERY     x4   OOPHORECTOMY     SHOULDER SURGERY  2011   right shoulder   THYROID SURGERY  1994   TONSILLECTOMY Bilateral 1974    Social History   Socioeconomic History   Marital status: Widowed    Spouse name: Chrissie Noa   Number of children: Not on file   Years of education: Not on file   Highest education level: Bachelor's degree (e.g., BA, AB, BS)  Occupational History   Occupation: retired  Tobacco Use   Smoking status: Never    Passive exposure: Past   Smokeless tobacco: Never  Vaping Use   Vaping status: Never Used  Substance and Sexual Activity   Alcohol use: No   Drug use: Never   Sexual activity: Yes    Birth control/protection: Surgical    Comment: Hysterectomy  Other Topics Concern   Not on file  Social History Narrative   Caffiene 4-5 12 oz cans soda.   Working retired - special ed.   Live with home no kids   Social Determinants of Health   Financial Resource Strain: Low Risk  (12/13/2022)   Overall Financial  Resource Strain (CARDIA)    Difficulty of Paying Living Expenses: Not very hard  Food Insecurity: No Food Insecurity (12/13/2022)   Hunger Vital Sign    Worried About Running Out of Food in the Last Year: Never true    Ran Out of Food in the Last Year: Never true  Transportation Needs: No Transportation Needs (12/13/2022)   PRAPARE - Administrator, Civil Service (Medical): No    Lack of Transportation (Non-Medical): No  Physical Activity: Inactive (12/13/2022)   Exercise Vital Sign    Days of Exercise per Week: 0 days    Minutes of Exercise per Session: 0 min  Stress: Stress Concern Present (12/13/2022)   [employer] of Occupational Health - Occupational Stress Questionnaire    Feeling of Stress : Rather much  Social Connections: Moderately Isolated (12/13/2022)   Social Connection and Isolation Panel [NHANES]    Frequency of Communication with Friends and Family: Twice a week    Frequency of Social  Gatherings with Friends and Family: More than three times a week    Attends Religious Services: 1 to 4 times per year    Active Member of Clubs or Organizations: No    Attends Banker Meetings: Never    Marital Status: Widowed  Intimate Partner Violence: Not At Risk (02/09/2022)   Humiliation, Afraid, Rape, and Kick questionnaire    Fear of Current or Ex-Partner: No    Emotionally Abused: No    Physically Abused: No    Sexually Abused: No    Outpatient Encounter Medications as of 12/15/2022  Medication Sig   acetaminophen (TYLENOL) 650 MG CR tablet Take 1,300 mg by mouth every 8 (eight) hours as needed for pain.   antiseptic oral rinse (BIOTENE) LIQD 15 mLs by Mouth Rinse route as needed for dry mouth.   aspirin EC 81 MG tablet Take 81 mg by mouth daily. Swallow whole.   azelastine (ASTELIN) 0.1 % nasal spray Place 2 sprays into both nostrils 2 (two) times daily. Use in each nostril as directed   baclofen (LIORESAL) 10 MG tablet TAKE 1 TABLET BY MOUTH  EACH MORNING AND 1 TABLET AT NOON.   Chlorphen-PE-Acetaminophen 4-10-325 MG TABS Take 1 tablet by mouth every 6 (six) hours as needed.   cholecalciferol (VITAMIN D) 25 MCG (1000 UNIT) tablet Take 1,000 Units by mouth daily.   desloratadine (CLARINEX) 5 MG tablet Take 1 tablet (5 mg total) by mouth daily.   Eptinezumab-jjmr (VYEPTI) 100 MG/ML injection Inject into the vein every 3 (three) months.   ferrous sulfate 325 (65 FE) MG tablet Take 325 mg by mouth daily.   folic acid (FOLVITE) 1 MG tablet Take 2 tablets (2 mg total) by mouth daily.   gabapentin (NEURONTIN) 300 MG capsule Take 300 mg by mouth 3 (three) times daily.   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   ibuprofen (ADVIL) 200 MG tablet Take 400 mg by mouth every 8 (eight) hours as needed for moderate pain.   JARDIANCE 10 MG TABS tablet Take 10 mg by mouth daily.   levofloxacin (LEVAQUIN) 500 MG tablet Take 1 tablet (500 mg total) by mouth daily for 10 days.   MAGNESIUM PO Take 500 mg by mouth at bedtime.   Methotrexate, PF, (RASUVO) 20 MG/0.4ML SOAJ Inject 20 mg into the skin once a week.   Multiple Vitamin (MULTIVITAMIN WITH MINERALS) TABS tablet Take 1 tablet by mouth daily.   omeprazole (PRILOSEC) 20 MG capsule Take 40 mg by mouth daily.   ondansetron (ZOFRAN-ODT) 8 MG disintegrating tablet Take 8 mg by mouth every 8 (eight) hours as needed for nausea.    oxyCODONE (ROXICODONE) 5 MG immediate release tablet Take 1 tablet (5 mg total) by mouth every 6 (six) hours as needed for severe pain.   Polyvinyl Alcohol-Povidone (REFRESH OP) Apply to eye as needed.   Rimegepant Sulfate (NURTEC) 75 MG TBDP Take 1 tablet (75 mg total) by mouth daily as needed. For migraines. Take as close to onset of migraine as possible. One daily maximum.   sertraline (ZOLOFT) 50 MG tablet Take 1.5 tablets (75 mg total dose) daily.   tiZANidine (ZANAFLEX) 4 MG tablet Take 1 tablet (4  mg total) by mouth at bedtime as needed.   TURMERIC-GINGER PO Take 1 tablet by mouth daily.   urea (CARMOL) 10 % cream Apply topically as needed.   [DISCONTINUED] fluticasone (FLONASE ALLERGY RELIEF) 50 MCG/ACT nasal spray Place  1 spray into both nostrils in the morning and at bedtime.   [DISCONTINUED] amoxicillin-clavulanate (AUGMENTIN) 875-125 MG tablet Take 1 tablet by mouth 2 (two) times daily. (Patient not taking: Reported on 12/08/2022)   [DISCONTINUED] doxycycline (VIBRA-TABS) 100 MG tablet Take 1 tablet (100 mg total) by mouth 2 (two) times daily.   [DISCONTINUED] methylPREDNISolone (MEDROL DOSEPAK) 4 MG TBPK tablet Take with signs of chronic sinusitis and take as directed   [DISCONTINUED] predniSONE (DELTASONE) 20 MG tablet Take 1 tablet (20 mg total) by mouth 2 (two) times daily with a meal for 5 days.   [EXPIRED] methylPREDNISolone acetate (DEPO-MEDROL) injection 40 mg    No facility-administered encounter medications on file as of 12/15/2022.    Allergies  Allergen Reactions   Sulfa Antibiotics Anaphylaxis   Sulfamethoxazole-Trimethoprim Anaphylaxis   Atorvastatin Other (See Comments)    Severe Muscle and Joint pain with ALL statins    Codeine Itching   Erythromycin Nausea And Vomiting   Metronidazole Diarrhea   Morphine And Codeine Itching   Semaglutide Nausea And Vomiting    Ozempic*    Pertinent ROS per HPI, otherwise unremarkable      Objective:  BP 120/74   Pulse 73   Temp (!) 97.2 F (36.2 C) (Temporal)   Ht 5\' 4"  (1.626 m)   Wt 159 lb (72.1 kg)   SpO2 97%   BMI 27.29 kg/m    Wt Readings from Last 3 Encounters:  12/15/22 159 lb (72.1 kg)  12/08/22 158 lb 3.2 oz (71.8 kg)  11/24/22 165 lb (74.8 kg)    Physical Exam Vitals and nursing note reviewed.  Constitutional:      General: She is not in acute distress.    Appearance: Normal appearance. She is well-developed. She is not ill-appearing, toxic-appearing or diaphoretic.  HENT:     Head:  Normocephalic and atraumatic.     Right Ear: A middle ear effusion is present.     Left Ear: A middle ear effusion is present.     Nose: Congestion present.     Right Turbinates: Enlarged.     Left Turbinates: Enlarged.     Right Sinus: Maxillary sinus tenderness and frontal sinus tenderness present.     Left Sinus: Maxillary sinus tenderness and frontal sinus tenderness present.     Mouth/Throat:     Lips: Pink.     Mouth: Mucous membranes are moist.     Pharynx: Oropharynx is clear.  Eyes:     Extraocular Movements: Extraocular movements intact.     Conjunctiva/sclera: Conjunctivae normal.     Pupils: Pupils are equal, round, and reactive to light.  Cardiovascular:     Rate and Rhythm: Normal rate. Rhythm irregularly irregular.     Heart sounds: Normal heart sounds.  Pulmonary:     Effort: Pulmonary effort is normal.     Breath sounds: Normal breath sounds.     Comments: Dry cough Musculoskeletal:     Cervical back: Normal range of motion and neck supple.     Right lower leg: No edema.     Left lower leg: No edema.  Skin:    General: Skin is warm and dry.     Capillary Refill: Capillary refill takes less than 2 seconds.  Neurological:     General: No focal deficit present.     Mental Status: She is alert and oriented to person, place, and time.  Psychiatric:        Mood and Affect: Mood normal.  Behavior: Behavior normal.        Thought Content: Thought content normal.        Judgment: Judgment normal.    Physical Exam   HEENT: Mid ear effusions noted. CHEST: Lungs clear to auscultation.        Results for orders placed or performed during the hospital encounter of 11/24/22  Glucose, capillary  Result Value Ref Range   Glucose-Capillary 114 (H) 70 - 99 mg/dL  Glucose, capillary  Result Value Ref Range   Glucose-Capillary 134 (H) 70 - 99 mg/dL       Pertinent labs & imaging results that were available during my care of the patient were reviewed by me and  considered in my medical decision making.  Assessment & Plan:  Christina Lewis was seen today for cough, nasal congestion and headache.  Diagnoses and all orders for this visit:  Acute recurrent pansinusitis -     azelastine (ASTELIN) 0.1 % nasal spray; Place 2 sprays into both nostrils 2 (two) times daily. Use in each nostril as directed -     Chlorphen-PE-Acetaminophen 4-10-325 MG TABS; Take 1 tablet by mouth every 6 (six) hours as needed. -     methylPREDNISolone acetate (DEPO-MEDROL) injection 40 mg  OSA (obstructive sleep apnea)     Assessment and Plan    Sinusitis Persistent symptoms despite Augmentin, Doxycycline, and Levaquin. Noted green nasal discharge. -Change nasal spray from Flonase to Astelin. -Administer steroid injection today to reduce inflammation. -Start Norel (contains Tylenol, antihistamine, and decongestant) twice daily for 10 days. -If no significant improvement, patient to notify clinic or ENT office.  Costochondritis Chest pain exacerbated by coughing, likely secondary to persistent cough from sinusitis. -Administer steroid injection today to reduce inflammation.  Sleep Apnea Recent Inspire implantation. -No specific plan discussed, continue current management.  Follow-up with ENT in 1 month.          Continue all other maintenance medications.  Follow up plan: Return if symptoms worsen or fail to improve.   Continue healthy lifestyle choices, including diet (rich in fruits, vegetables, and lean proteins, and low in salt and simple carbohydrates) and exercise (at least 30 minutes of moderate physical activity daily).  Educational handout given for sinusitis   The above assessment and management plan was discussed with the patient. The patient verbalized understanding of and has agreed to the management plan. Patient is aware to call the clinic if they develop any new symptoms or if symptoms persist or worsen. Patient is aware when to return to the  clinic for a follow-up visit. Patient educated on when it is appropriate to go to the emergency department.   Kari Baars, FNP-C [facility] [phone]

## 2022-12-17 ENCOUNTER — Encounter: Payer: Self-pay | Admitting: Family Medicine

## 2022-12-17 MED ORDER — PREDNISONE 20 MG PO TABS
40.0000 mg | ORAL_TABLET | Freq: Every day | ORAL | 0 refills | Status: AC
Start: 2022-12-17 — End: 2022-12-22

## 2022-12-17 NOTE — Addendum Note (Signed)
Addended by: Sonny Masters on: 12/17/2022 11:36 AM   Modules accepted: Orders

## 2022-12-24 ENCOUNTER — Encounter: Payer: Self-pay | Admitting: Adult Health

## 2022-12-24 DIAGNOSIS — G43901 Migraine, unspecified, not intractable, with status migrainosus: Secondary | ICD-10-CM

## 2022-12-27 ENCOUNTER — Other Ambulatory Visit: Payer: Self-pay | Admitting: *Deleted

## 2022-12-27 ENCOUNTER — Encounter: Payer: Self-pay | Admitting: Neurology

## 2022-12-27 DIAGNOSIS — Z79899 Other long term (current) drug therapy: Secondary | ICD-10-CM

## 2022-12-27 DIAGNOSIS — M0609 Rheumatoid arthritis without rheumatoid factor, multiple sites: Secondary | ICD-10-CM

## 2022-12-27 MED ORDER — HYDROXYCHLOROQUINE SULFATE 200 MG PO TABS
ORAL_TABLET | ORAL | 0 refills | Status: DC
Start: 2022-12-27 — End: 2023-03-24

## 2022-12-27 MED ORDER — BUTALBITAL-APAP-CAFFEINE 50-325-40 MG PO TABS
1.0000 | ORAL_TABLET | Freq: Four times a day (QID) | ORAL | 0 refills | Status: DC | PRN
Start: 2022-12-27 — End: 2023-02-01

## 2022-12-27 NOTE — Telephone Encounter (Signed)
Spoke to pt states  declines dosepak due to simus  infection medication Pt states has taken Foricet before  Per [name],NP did send a rx for forcet 10 tablets for  3 daysTake 1-2 tablets by mouth every 6 (six) hours as needed for headache.   After the 3rd day if  no relief please go to urgent care to get evaluated. Made pt aware thar rx was sent to pharmacy. Fyi pt states she was taken Nurtec 2 in 24 hours , Made pt aware Nurtec is 1  tablet every 24 hours or  1 tablet every other day  Pt expressed understating and thanked me for calling

## 2022-12-27 NOTE — Telephone Encounter (Signed)
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   Okay to refill Plaquenil?

## 2022-12-27 NOTE — Telephone Encounter (Signed)
Can you call this patient. Has she ever had anything in the past for her headaches that worked? Let her know our office will be closed tomorrow. May have to go to Urgent care.

## 2022-12-28 ENCOUNTER — Telehealth: Payer: Self-pay | Admitting: *Deleted

## 2022-12-28 DIAGNOSIS — Z79899 Other long term (current) drug therapy: Secondary | ICD-10-CM

## 2022-12-28 LAB — COMPLETE METABOLIC PANEL WITH GFR
AG Ratio: 2.1 (calc) (ref 1.0–2.5)
ALT: 33 U/L — ABNORMAL HIGH (ref 6–29)
AST: 21 U/L (ref 10–35)
Albumin: 4.5 g/dL (ref 3.6–5.1)
Alkaline phosphatase (APISO): 66 U/L (ref 37–153)
BUN: 20 mg/dL (ref 7–25)
CO2: 27 mmol/L (ref 20–32)
Calcium: 10 mg/dL (ref 8.6–10.4)
Chloride: 105 mmol/L (ref 98–110)
Creat: 0.68 mg/dL (ref 0.50–1.05)
Globulin: 2.1 g/dL (ref 1.9–3.7)
Glucose, Bld: 105 mg/dL — ABNORMAL HIGH (ref 65–99)
Potassium: 4.6 mmol/L (ref 3.5–5.3)
Sodium: 139 mmol/L (ref 135–146)
Total Bilirubin: 0.3 mg/dL (ref 0.2–1.2)
Total Protein: 6.6 g/dL (ref 6.1–8.1)
eGFR: 97 mL/min/{1.73_m2} (ref >=60)

## 2022-12-28 LAB — CBC WITH DIFFERENTIAL/PLATELET
Absolute Lymphocytes: 3650 {cells}/uL (ref 850–3900)
Absolute Monocytes: 934 {cells}/uL (ref 200–950)
Basophils Absolute: 146 {cells}/uL (ref 0–200)
Basophils Relative: 1 %
Eosinophils Absolute: 672 {cells}/uL — ABNORMAL HIGH (ref 15–500)
Eosinophils Relative: 4.6 %
HCT: 43.6 % (ref 35.0–45.0)
Hemoglobin: 14.6 g/dL (ref 11.7–15.5)
MCH: 30.8 pg (ref 27.0–33.0)
MCHC: 33.5 g/dL (ref 32.0–36.0)
MCV: 92 fL (ref 80.0–100.0)
MPV: 10.9 fL (ref 7.5–12.5)
Monocytes Relative: 6.4 %
Neutro Abs: 9198 {cells}/uL — ABNORMAL HIGH (ref 1500–7800)
Neutrophils Relative %: 63 %
Platelets: 342 10*3/uL (ref 140–400)
RBC: 4.74 10*6/uL (ref 3.80–5.10)
RDW: 13.5 % (ref 11.0–15.0)
Total Lymphocyte: 25 %
WBC: 14.6 10*3/uL — ABNORMAL HIGH (ref 3.8–10.8)

## 2022-12-28 NOTE — Progress Notes (Signed)
Glucose is 105.  ALT is borderline elevated-33.  Avoid the use of tylenol and alcohol use. Recheck in 1 month.  CBC stable.

## 2022-12-28 NOTE — Telephone Encounter (Signed)
-----   Message from Gearldine Bienenstock sent at 12/28/2022  7:18 AM EST ----- Glucose is 105.  ALT is borderline elevated-33.  Avoid the use of tylenol and alcohol use. Recheck in 1 month.  CBC stable.

## 2022-12-29 DIAGNOSIS — F331 Major depressive disorder, recurrent, moderate: Secondary | ICD-10-CM | POA: Diagnosis not present

## 2022-12-29 NOTE — Telephone Encounter (Signed)
Pt was called. See phone note.

## 2023-01-03 ENCOUNTER — Other Ambulatory Visit: Payer: Self-pay | Admitting: Rheumatology

## 2023-01-03 NOTE — Telephone Encounter (Signed)
Last Fill: 12/13/2022  Next Visit: 02/18/2023  Last Visit: 11/17/2022  Dx: Rheumatoid arthritis of multiple sites with negative rheumatoid factor   Current Dose per office note on 11/17/2022: not mentioned  Okay to refill Tizanidine?

## 2023-01-09 ENCOUNTER — Other Ambulatory Visit: Payer: Self-pay | Admitting: Physician Assistant

## 2023-01-10 ENCOUNTER — Other Ambulatory Visit (HOSPITAL_COMMUNITY): Payer: Self-pay | Admitting: Psychiatry

## 2023-01-10 DIAGNOSIS — F419 Anxiety disorder, unspecified: Secondary | ICD-10-CM

## 2023-01-10 DIAGNOSIS — F4321 Adjustment disorder with depressed mood: Secondary | ICD-10-CM

## 2023-01-10 DIAGNOSIS — F331 Major depressive disorder, recurrent, moderate: Secondary | ICD-10-CM

## 2023-01-10 NOTE — Telephone Encounter (Signed)
Last Fill: 12/13/2022  Next Visit: 02/18/2023  Last Visit: 11/17/2022  Dx: Fibromyalgia   Current Dose per office note on 11/17/2022: not discussed  Okay to refill Baclofen?

## 2023-01-13 DIAGNOSIS — F411 Generalized anxiety disorder: Secondary | ICD-10-CM | POA: Diagnosis not present

## 2023-01-13 DIAGNOSIS — F331 Major depressive disorder, recurrent, moderate: Secondary | ICD-10-CM | POA: Diagnosis not present

## 2023-01-14 ENCOUNTER — Ambulatory Visit: Payer: Medicare PPO | Admitting: Podiatry

## 2023-01-14 ENCOUNTER — Encounter (INDEPENDENT_AMBULATORY_CARE_PROVIDER_SITE_OTHER): Payer: Self-pay | Admitting: Otolaryngology

## 2023-01-14 ENCOUNTER — Ambulatory Visit (INDEPENDENT_AMBULATORY_CARE_PROVIDER_SITE_OTHER): Payer: Medicare PPO | Admitting: Otolaryngology

## 2023-01-14 VITALS — BP 123/78 | HR 67

## 2023-01-14 DIAGNOSIS — J329 Chronic sinusitis, unspecified: Secondary | ICD-10-CM

## 2023-01-14 DIAGNOSIS — G4733 Obstructive sleep apnea (adult) (pediatric): Secondary | ICD-10-CM

## 2023-01-14 DIAGNOSIS — R0981 Nasal congestion: Secondary | ICD-10-CM

## 2023-01-14 DIAGNOSIS — J3089 Other allergic rhinitis: Secondary | ICD-10-CM

## 2023-01-14 DIAGNOSIS — R0982 Postnasal drip: Secondary | ICD-10-CM

## 2023-01-14 DIAGNOSIS — J0140 Acute pansinusitis, unspecified: Secondary | ICD-10-CM

## 2023-01-14 DIAGNOSIS — Z789 Other specified health status: Secondary | ICD-10-CM

## 2023-01-14 MED ORDER — METHYLPREDNISOLONE 4 MG PO TBPK: ORAL_TABLET | ORAL | 1 refills | Status: DC

## 2023-01-14 NOTE — Progress Notes (Signed)
ENT Progress Note  Update 01/14/23:   Discussed the use of AI scribe software for clinical note transcription with the patient, who gave verbal consent to proceed.  History of Present Illness   The patient, with a recent history of Inspire implant surgery, presents for follow-up. The patient reports mild discomfort at the site of submental incision at night. Despite these symptoms, the patient expresses satisfaction with the overall surgical outcome and how incisions appear currently.   In addition to post-surgical discomfort, the patient has been dealing with a persistent sinus issues/nasal congestion. They report a recent severe sinus infection, which required multiple changes in antibiotic therapy, including two courses of Augmentin. The infection has since improved, but the patient continues to experience significant post-nasal drainage, particularly at night, to the point of choking. Prior to the infection, the patient had been experiencing chronic nasal congestion and a constant need to clear the nose, which had temporarily resolved with changes in medication but has since returned.  The patient is currently on a regimen of allergy medications, including nasal sprays. The patient has not been using saline rinses but has been using saline sprays.     Initial evaluation 09/22/22 Reason for Consult: OSA, nasal congestion and facial pain/pressure/post-nasal drainage/headaches/tinnitus    HPI: Christina Lewis is an 66 y.o. female with hx RA affecting small joints, f.b Rheum on Methotrexate and Plaquenil, hx of OSA, previously on CPAP for many years, however, discontinued CPAP use 2/2 being unable to tolerate the mask, here for multiple complaints.   1)  She is here for nasal congestion, nasal obstruction, and frontal headaches for years, feels nasal drainage and post-nasal drainage. She has decreased sense of smell, and she thinks it happened after covid 2021. Had allergy testing 4 years ago and  it was negative. She had allergy shots for 4 yrs when she was a child. On Flonase and Zyrtec. She has hx of migraines, and feels they are under control after starting an infusion medication (new drug for migraines). She has frontal headache on the left when it acts up and with her sinus sx. 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 No new sx   3) hx of OSA and had three CPAP machines (used one for 1.5 yrs) and diagnosed in 2002. She stopped using it due to intoleence and was recently re-established with sleep medicine. Recent sleep study done with [facility] neurolog[facility] and she is interested in [place] implant.   3) Bilateral tinnitus, water in her ear, worse on the left, and she had ear infections in her ears and chronic sinus all her life, she  4) She reports hx of Esophageal dysmotility/GERD - on Prilosec.    Records Reviewed:  Being seen by Neurology for migraines - last visit with NP [place] 04/08/22 - migraines behind left eye with aura sx   TFTs have always normal has Endocrinologist.  Thyroid U/S 05/06/22  Estimated total number of nodules >/= 1 cm: 2   Number of spongiform nodules >/=  2 cm not described below (TR1): 0   Number of mixed cystic and solid nodules >/= 1.5 cm not described below (TR2): 0   _________________________________________________________   Nodule # 4: Previously biopsied nodule in the left mid gland is unchanged at 1.2 x 1.2 x 1.1  cm.   Nodule # 6: Previously biopsied nodule in the left inferior gland is unchanged at 1.8 x 1.6 x 1.2 cm.   Additional small subcentimeter thyroid nodules again noted scattered throughout the right gland. No interval change. No new nodules or suspicious features.   IMPRESSION: 1. Previously biopsied nodules in the left mid  and left lower gland remain unchanged. Greater than 10 years of stability is consistent with benignity. 2. No new nodules or suspicious features.    Past Medical History:  Diagnosis Date   Allergy    Anemia    Anxiety    Arthritis    Depression    Diabetes mellitus    type 2   Fibromyalgia    GERD (gastroesophageal reflux disease)    Gout    no current problem   Hematuria    History of kidney stones    passed stones and also had surgery to remove one   History of tics    Ineffective esophageal motility    tx with prilosec, essophageal was stretched   Interstitial cystitis 2016   Migraines    Osteoarthritis    PCOS (polycystic ovarian syndrome) 1993   Pneumonia 2019   PONV (postoperative nausea and vomiting)    Sleep apnea    does not use CPAP   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, took medication for a year   Thyroid nodule    Dr Horald Pollen is monitoring every year    Past Surgical History:  Procedure Laterality Date   ABDOMINAL HYSTERECTOMY  2000   APPENDECTOMY  1976   BALLOON DILATION N/A 11/03/2021   Procedure: Marvis Repress DILATION;  Surgeon: Kerin Salen, MD;  Location: [facility];  Service: Gastroenterology;  Laterality: N/A;   BOTOX INJECTION N/A 11/03/2021   Procedure: BOTOX INJECTION;  Surgeon: Kerin Salen, MD;  Location: [facility];  Service: Gastroenterology;  Laterality: N/A;   BREAST LUMPECTOMY  1989   lt-negative   CARDIOVASCULAR STRESS TEST  2000   CATARACT EXTRACTION W/PHACO Left 12/07/2019   Procedure: CATARACT EXTRACTION PHACO AND INTRAOCULAR LENS PLACEMENT (IOC);  Surgeon: [name], MD;  Location: [facility];  Service: Ophthalmology;  Laterality: Left;  CDE: 5.55   CHOLECYSTECTOMY  1985   COLONOSCOPY     DRUG INDUCED ENDOSCOPY Bilateral 09/29/2022   Procedure: DRUG INDUCED ENDOSCOPY;  Surgeon: Ashok Croon, MD;  Location: [facility];  Service: ENT;  Laterality: Bilateral;  15 MINUTES   ESOPHAGEAL MANOMETRY N/A  08/24/2021   Procedure: ESOPHAGEAL MANOMETRY (EM);  Surgeon: Kerin Salen, MD;  Location: [facility];  Service: Gastroenterology;  Laterality: N/A;   ESOPHAGOGASTRODUODENOSCOPY N/A 11/03/2021   Procedure: ESOPHAGOGASTRODUODENOSCOPY (EGD);  Surgeon: Kerin Salen, MD;  Location: [facility];  Service: Gastroenterology;  Laterality: N/A;   HYSTERECTOMY ABDOMINAL WITH SALPINGECTOMY Bilateral 2000   IMPLANTATION OF HYPOGLOSSAL NERVE STIMULATOR Right 11/24/2022   Procedure: IMPLANTATION OF HYPOGLOSSAL NERVE STIMULATOR;  Surgeon: Ashok Croon, MD;  Location: [facility];  Service: ENT;  Laterality: Right;   KIDNEY STONE SURGERY Right 2014   with stent placement in OR   KNEE ARTHROSCOPY Right 08/22/2013   Procedure: ARTHROSCOPY RIGHT KNEE FOR INFECTION LAVAGE AND DRAINAGE;  Surgeon: Thera Flake., MD;  Location: [facility];  Service: Orthopedics;  Laterality: Right;   KNEE ARTHROSCOPY WITH PATELLA RECONSTRUCTION Right 08/06/2013   Procedure: RIGHT KNEE ARTHROSCOPY WITH MENISCECTOMY MEDIAL, ARTHROSCOPY KNEE WITH DEBRIDEMENT/SHAVING (CHONDROPLASTY) ;  Surgeon: Thera Flake., MD;  Location: [facility];  Service: Orthopedics;  Laterality: Right;   LUMBAR LAMINECTOMY/DECOMPRESSION MICRODISCECTOMY Right 09/11/2019   Procedure: Right Lumbar Three-Four Microdiscectomy;  Surgeon: Maeola Harman, MD;  Location: [facility];  Service: Neurosurgery;  Laterality: Right;  Right Lumbar Three-Four Microdiscectomy   NASAL SINUS SURGERY     x4   OOPHORECTOMY     SHOULDER SURGERY  2011   right shoulder   THYROID SURGERY  1994   TONSILLECTOMY Bilateral 1974    Family History  Problem Relation Age of Onset   Fibromyalgia Mother    Psoriasis Mother        psoriatic arthritis    Diabetes Mother    Heart disease Mother    Psoriasis Sister        psoriatic arthritis    Diabetes Sister    Migraines Sister    Rheum arthritis Sister    Fibromyalgia Sister    Diabetes Sister     Social History:   reports that she has never smoked. She has been exposed to tobacco smoke. She has never used smokeless tobacco. She reports that she does not drink alcohol and does not use drugs.  Allergies:  Allergies  Allergen Reactions   Sulfa Antibiotics Anaphylaxis   Sulfamethoxazole-Trimethoprim Anaphylaxis   Atorvastatin Other (See Comments)    Severe Muscle and Joint pain with ALL statins    Codeine Itching   Erythromycin Nausea And Vomiting   Metronidazole Diarrhea   Morphine And Codeine Itching   Semaglutide Nausea And Vomiting    Ozempic*    Medications: I have reviewed the patient's current medications.  The PMH, PSH, Medications, Allergies, and SH were reviewed and updated.  ROS: Constitutional: Negative for fever, weight loss and weight gain. Cardiovascular: Negative for chest pain and dyspnea on exertion. Respiratory: Is not experiencing shortness of breath at rest. Gastrointestinal: Negative for nausea and vomiting. Neurological: Negative for headaches. Psychiatric: The patient is not nervous/anxious  Blood pressure 123/78, pulse 67, SpO2 97%.  PHYSICAL EXAM:  Exam: General: Well-developed, well-nourished Respiratory Respiratory effort: Equal inspiration and expiration without stridor Cardiovascular Peripheral Vascular: Warm extremities with equal color/perfusion Eyes: No nystagmus with equal extraocular motion bilaterally Neuro/Psych/Balance: Patient oriented to person, place, and time; Appropriate mood and affect; Gait is intact with no imbalance; Cranial nerves I-XII are intact Head and Face Inspection: Normocephalic and atraumatic without mass or lesion Palpation: Facial skeleton intact without bony stepoffs Salivary Glands: No mass or tenderness Facial Strength: Facial motility symmetric and full bilaterally ENT Pinna: External ear intact and fully developed External canal: Canal is patent with intact skin Tympanic Membrane: Clear and mobile External Nose: No  scar or anatomic deformity Internal Nose: Septum is straight. No polyp, or purulence. Mucosal edema and erythema present.  Bilateral inferior turbinate hypertrophy.  Lips, Teeth, and gums: Mucosa and teeth intact and viable TMJ: No pain to palpation with full mobility Oral cavity/oropharynx: No erythema or exudate, no lesions present Nasopharynx: No mass or lesion with intact mucosa Neck Neck and Trachea: Midline trachea without mass or lesion Thyroid: No mass or nodularity Lymphatics: No lymphadenopathy Right submental and chest wall incision c/d/I completely healed at this point  Procedure:   PROCEDURE NOTE: nasal endoscopy  Preoperative diagnosis: chronic sinusitis symptoms  Postoperative diagnosis: same  Procedure: Diagnostic nasal endoscopy (78295)  Surgeon: Ashok Croon, M.D.  Anesthesia: Topical lidocaine and Afrin  H&P REVIEW: The patient's history and physical were reviewed today prior to procedure. All medications were reviewed and updated as well. Complications: None Condition  is stable throughout exam Indications and consent: The patient presents with symptoms of chronic sinusitis not responding to previous therapies. All the risks, benefits, and potential complications were reviewed with the patient preoperatively and informed consent was obtained. The time out was completed with confirmation of the correct procedure.   Procedure: The patient was seated upright in the clinic. Topical lidocaine and Afrin were applied to the nasal cavity. After adequate anesthesia had occurred, the rigid nasal endoscope was passed into the nasal cavity. The nasal mucosa, turbinates, septum, and sinus drainage pathways were visualized bilaterally. This revealed prior sinus surgery and no purulence or significant secretions that might be cultured. There were no polyps or sites of significant inflammation. The mucosa was intact and there was no crusting present. The scope was then slowly  withdrawn and the patient tolerated the procedure well. There were no complications or blood loss.      Studies Reviewed: Sleep Study 05/03/2022 done in-lab at [facility]  67 year old female with an underlying medical history of migraine headaches, allergies, arthritis, headache, depression, diabetes, fibromyalgia, reflux disease, gout, kidney stones, interstitial cystitis, PCOS, thyroid nodule, and overweight state, who was previously diagnosed with obstructive sleep apnea. She has not been on PAP therapy for several years 2/2 intolerance. Her original sleep apnea diagnosis was in the 90s and per patient report she was severe at the time. Her Epworth sleepiness score is 5/24, fatigue severity score is 63 out of 63.  Height: 64 in Weight: 174 (BMI 29) Neck Size: 16 in   RESPIRATORY MONITORING:   Based on CMS criteria (using a 4% oxygen desaturation rule for scoring hypopneas), there were 39 apneas (39 obstructive; 0 central; 0 mixed), and 70 hypopneas.  Apnea index was 6.7. Hypopnea index was 12.1. The apnea-hypopnea index was 18.8/hour overall (n/s supine, 75 non-supine; 75.1/hour during REM sleep, 0.0 supine REM).  There were 0 respiratory effort-related arousals (RERAs).  The RERA index was 0 events/h. Total respiratory disturbance index (RDI) was 18.8 events/h. RDI results showed: supine RDI  0.0 /h; non-supine RDI 18.8 /h; REM RDI 75.1 /h, supine REM RDI 0.0 /h.   Based on AASM criteria (using a 3% oxygen desaturation and /or arousal rule for scoring hypopneas), there were 39 apneas (39 obstructive; 0 central; 0 mixed), and 71 hypopneas. Apnea index was 6.7. Hypopnea index was 12.3. The apnea-hypopnea index was 19.0 overall (0.0 supine, 75 non-supine; 75.1 REM, 0.0 supine REM).  There were 0 respiratory effort-related arousals (RERAs).  The RERA index was 0 events/h. Total respiratory disturbance index (RDI) was 19.0 events/h. RDI results showed: supine RDI  0.0 /h; non-supine RDI 19.0 /h;  REM RDI 75.1 /h, supine REM RDI 0.0 /h.    OXIMETRY: Oxyhemoglobin Saturation Nadir during sleep was at  72% from a mean of 94%.  Of the Total sleep time (TST)   hypoxemia (=<88%) was present for  14.9 minutes, or 4.3% of total sleep time.    LIMB MOVEMENTS: There were 0 periodic limb movements of sleep (0.0/hr), of which 0 (0.0/hr) were associated with an arousal.   AROUSAL: There were 45 arousals in total, for an arousal index of 8 arousals/hour.  Of these, 11 were identified as respiratory-related arousals (2 /h), 0 were PLM-related arousals (0 /h), and 50 were non-specific arousals (9 /h).   EEG: Review of the EEG showed no abnormal electrical discharges and symmetrical bihemispheric findings.     EKG: The EKG revealed normal sinus rhythm (NSR). The average heart rate during  sleep was 70 bpm.    AUDIO/VIDEO REVIEW: The audio and video review did not show any abnormal or unusual behaviors, movements, phonations or vocalizations. The patient took no restroom breaks. Snoring was noted throughout the night, ranging from milder to louder.   POST-STUDY QUESTIONNAIRE: Post study, the patient indicated, that sleep was worse than usual   IMPRESSION:  1. Obstructive Sleep Apnea (OSA) 2. Dysfunctions associated with sleep stages or arousal from sleep   RECOMMENDATIONS:  1. This study demonstrates moderate to severe obstructive sleep apnea, with a total AHI of 18.8/hour, and O2 nadir of 72% (during non-supine REM sleep).  Of note, the absence of supine sleep during the study likely underestimates her sleep disordered breathing.  Treatment with positive airway pressure in the form of CPAP is recommended. The patient will be advised to proceed with home AutoPap therapy for now.  A full night laboratory attended CPAP titration study can be considered to optimize therapy settings, mask fit, monitoring of tolerance and of proper oxygen saturations down the road. Other treatment options may be limited, and  may include (generally speaking) surgical options in selected patients or the use of an oral appliance in certain patients. Concomitant weight loss is recommended (where clinically appropriate). Please note that untreated obstructive sleep apnea may carry additional perioperative morbidity. Patients with significant obstructive sleep apnea should receive perioperative PAP therapy and the surgeons and particularly the anesthesiologist should be informed of the diagnosis and the severity of the sleep disordered breathing. 2. This study shows sleep fragmentation and abnormal sleep stage percentages; these are nonspecific findings and per se do not signify an intrinsic sleep disorder or a cause for the patient's sleep-related symptoms. Causes include (but are not limited to) the first night effect of the sleep study, circadian rhythm disturbances, medication effect or an underlying mood disorder or medical problem.  3. The patient should be cautioned not to drive, work at heights, or operate dangerous or heavy equipment when tired or sleepy. Review and reiteration of good sleep hygiene measures should be pursued with any patient. 4. The patient will be seen in follow-up in the sleep clinic at [facility] for discussion of the test results, symptom and treatment compliance review, further management strategies, etc. The patient and her referring provider will be notified of the test results.        Assessment/Plan: Encounter Diagnoses  Name Primary?   Obstructive sleep apnea Yes   Intolerance of continuous positive airway pressure (CPAP) ventilation    Chronic sinusitis, unspecified location    Nasal congestion    Subacute pansinusitis    Environmental and seasonal allergies    Post-nasal drip      OSA dx many years ago and CPAP intolerance/non-compliance due to discomfort despite of trial of various masks, I reviewed her most recent study from 3/204 with [facility] and based on her recent BMI and sleep study  results, she is a candidate for inspire therapy.  I discussed risks benefits and postoperative care for the procedure and she would like to proceed with the final stage of the workup for inspire implant.  Will work on scheduling her for drug-induced sleep endoscopy Nasal congestion chronic headaches concern for chronic sinusitis in the setting of allergies and history of sinus surgery in the past -nasal endoscopy today with evidence of nasal mucosal edema and prior FESS, no purulence or polyps, unable to fully visualize sinuses, will obtain sinus CT to evaluate Bilateral tinnitus described as ocean sounds, DDx age-related hearing loss  versus eustachian tube dysfunction -will obtain audiogram Thyroid nodules -she will continue to follow-up with serial thyroid ultrasounds ordered by her PCP.  Prior workup including FNA not concerning for atypical cells and normal TFTs in the past.   - schedule hearing test - start nasal saline rinses Lloyd Huger Med bottle)  - stop Zyrtec, start Clarinex, continue Flonase or if start steroid nasal rinses, ok to stop Flonase  - schedule CT sinuses  - work on scheduling of the sleep endoscopy ([facility]) - return after testing  Patient Instructions:  We will fax over an order for steroid carpule shipment to you directly to the compounding pharmacy in [place], [place]. They will call you to quote you the price and if acceptable, you can order it to add to the nasal saline rinses   NeilMed saline irrigation bottle can be found/purchased at the following website (or at any drug store):  https://shop.neilmed.com/products/sinus-rinse-starter-kit-[email or web address]-packets  Please search for YouTube videos to show you how to irrigate with saline (google sinus irrigation and filter for videos)  Update 01/14/23 S/p Inspire Implant 4 weeks ago. Doing well. Having persistent nasal congestion and was treated for a sinus infection with two rounds of Augmentin. Has persistent nasal congestion and PND.    Assessment and Plan    S/p Inspire Implant, doing well, has activation appointment coming up  Implant site looks good with incisions completely healed at this point. CN XII intact  - Proceed with activation appointment for Inspire - Follow up with sleep practice for settings adjustment and potential confirmatory sleep test in a few months  Recurrent Sinusitis vs exacerbation of chronic nasal congestion after URI Reports ongoing sinus issues with drainage causing choking at night. History of recent sinus infection treated with multiple antibiotics, Augmentin x 2. Recent CT sinus scan showed no chronic sinus disease. Current symptoms suggest residual congestion. Discussed that sometimes antibiotics are not needed and a steroid pack may help reduce inflammation quickly. Advised to use Neilmed sinus rinse to help with residual congestion. Nasal endoscopy without pus or polyps but with evidence of mucosal edema.  - Prescribed Medrol Dosepak - Continue nasal sprays (Azelastine 2 puffs b/l nares BID), continue Clarinex 5 mg daily  - Recommend Neilmed sinus rinse daily  - will consider CT sinuses if sx will not improve     Ashok Croon, MD Otolaryngology [facility] Phone: [phone] Fax: [phone]    01/14/2023, 4:02 PM

## 2023-01-14 NOTE — Patient Instructions (Signed)
-   continue Azelastine and allergy pill - take medrol dose pack  Lloyd Huger Med Nasal Saline Rinse   - start nasal saline rinses with NeilMed Bottle available over the counter or online to help with nasal congestion

## 2023-01-17 ENCOUNTER — Encounter (HOSPITAL_COMMUNITY): Payer: Self-pay | Admitting: Psychiatry

## 2023-01-17 ENCOUNTER — Telehealth (HOSPITAL_BASED_OUTPATIENT_CLINIC_OR_DEPARTMENT_OTHER): Payer: Medicare PPO | Admitting: Psychiatry

## 2023-01-17 VITALS — Wt 159.0 lb

## 2023-01-17 DIAGNOSIS — F419 Anxiety disorder, unspecified: Secondary | ICD-10-CM

## 2023-01-17 DIAGNOSIS — F331 Major depressive disorder, recurrent, moderate: Secondary | ICD-10-CM

## 2023-01-17 DIAGNOSIS — F4321 Adjustment disorder with depressed mood: Secondary | ICD-10-CM | POA: Diagnosis not present

## 2023-01-17 MED ORDER — SERTRALINE HCL 100 MG PO TABS
100.0000 mg | ORAL_TABLET | Freq: Every day | ORAL | 1 refills | Status: DC
Start: 2023-01-17 — End: 2023-01-24

## 2023-01-17 NOTE — Progress Notes (Signed)
[facility] MD Virtual Progress Note   Patient Location: Home Provider Location: Office  I connect with patient by video and verified that I am speaking with correct person by using two identifiers. I discussed the limitations of evaluation and management by telemedicine and the availability of in person appointments. I also discussed with the patient that there may be a patient responsible charge related to this service. The patient expressed understanding and agreed to proceed.  Christina Lewis [id number] 67 y.o.  01/17/2023 2:56 PM  History of Present Illness:  Patient is evaluated by video session.  She is taking Zoloft 75 mg.  Despite taking medication she still feel not happy.  She feel numb and has no motivation to do things.  She denies any crying spells or any feeling of hopelessness.  She denies any insomnia and able to do ADLs, taking showers, go outside and doing errands but does not feel enjoyment.  It has been 8 months since her husband died.  She is still living with her sister.  She did complete the grief counseling and now started therapy once a week.  She feel since she has not back to her home she is actually not facing the life without her husband.  She understand that she may be still in denial that 1 day when she go back to her home her husband will be there.  She also reported that she used to walk and exercise which she had stopped.  She feels unmotivated to do things which she used to enjoy but does do routine things.  Her plan is to stay with her sister on Thanksgiving.  She is taking Zoloft and reported no tremor or shakes or any EPS.  Her diarrhea is subsided.  She denies any suicidal thoughts or homicidal thoughts.  Past Psychiatric History: History of depression in 1990 and given Prozac and Lexapro by PCP.  Saw Dr Evelene Croon in 2012 and prescribed Cymbalta.  Dose increased to 120 mg and later doxepin was added.  They were discontinued after found ineffective.   We tried Wellbutrin but had side effects.  Took gabapentin to help neuropathy but ineffective.  No history of suicidal attempt, inpatient treatment, psychosis, mania, abuse, legal issues, seizures, TBI.    Outpatient Encounter Medications as of 01/17/2023  Medication Sig   acetaminophen (TYLENOL) 650 MG CR tablet Take 1,300 mg by mouth every 8 (eight) hours as needed for pain.   antiseptic oral rinse (BIOTENE) LIQD 15 mLs by Mouth Rinse route as needed for dry mouth.   aspirin EC 81 MG tablet Take 81 mg by mouth daily. Swallow whole.   azelastine (ASTELIN) 0.1 % nasal spray Place 2 sprays into both nostrils 2 (two) times daily. Use in each nostril as directed   baclofen (LIORESAL) 10 MG tablet TAKE 1 TABLET BY MOUTH EACH MORNING AND 1 TABLET AT NOON.   butalbital-acetaminophen-caffeine (FIORICET) 50-325-40 MG tablet Take 1-2 tablets by mouth every 6 (six) hours as needed for headache.   Chlorphen-PE-Acetaminophen 4-10-325 MG TABS Take 1 tablet by mouth every 6 (six) hours as needed.   cholecalciferol (VITAMIN D) 25 MCG (1000 UNIT) tablet Take 1,000 Units by mouth daily.   desloratadine (CLARINEX) 5 MG tablet Take 1 tablet (5 mg total) by mouth daily.   Eptinezumab-jjmr (VYEPTI) 100 MG/ML injection Inject into the vein every 3 (three) months.   ferrous sulfate 325 (65 FE) MG tablet Take 325 mg by mouth daily.   folic acid (FOLVITE) 1 MG  tablet Take 2 tablets (2 mg total) by mouth daily.   gabapentin (NEURONTIN) 300 MG capsule Take 300 mg by mouth 3 (three) times daily.   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   ibuprofen (ADVIL) 200 MG tablet Take 400 mg by mouth every 8 (eight) hours as needed for moderate pain.   JARDIANCE 10 MG TABS tablet Take 10 mg by mouth daily.   MAGNESIUM PO Take 500 mg by mouth at bedtime.   Methotrexate, PF, (RASUVO) 20 MG/0.4ML SOAJ Inject 20 mg into the skin once a week.    methylPREDNISolone (MEDROL DOSEPAK) 4 MG TBPK tablet Take with signs of chronic sinusitis and take as directed   Multiple Vitamin (MULTIVITAMIN WITH MINERALS) TABS tablet Take 1 tablet by mouth daily.   omeprazole (PRILOSEC) 20 MG capsule Take 40 mg by mouth daily.   ondansetron (ZOFRAN-ODT) 8 MG disintegrating tablet Take 8 mg by mouth every 8 (eight) hours as needed for nausea.    oxyCODONE (ROXICODONE) 5 MG immediate release tablet Take 1 tablet (5 mg total) by mouth every 6 (six) hours as needed for severe pain.   Polyvinyl Alcohol-Povidone (REFRESH OP) Apply to eye as needed.   Rimegepant Sulfate (NURTEC) 75 MG TBDP Take 1 tablet (75 mg total) by mouth daily as needed. For migraines. Take as close to onset of migraine as possible. One daily maximum.   sertraline (ZOLOFT) 50 MG tablet Take 1.5 tablets (75 mg total dose) daily.   tiZANidine (ZANAFLEX) 4 MG tablet TAKE ONE TABLET BY MOUTH AT BEDTIME AS NEEDED   TURMERIC-GINGER PO Take 1 tablet by mouth daily.   urea (CARMOL) 10 % cream Apply topically as needed.   No facility-administered encounter medications on file as of 01/17/2023.    Recent Results (from the past 2160 hour(s))  [facility] DIABETES EYE EXAM     Status: None   Collection Time: 10/22/22 12:43 PM  Result Value Ref Range   [facility] Diabetic Eye Exam      Comment: Unable to determine - Abstracted by HIM  Glucose, capillary     Status: Abnormal   Collection Time: 11/17/22  3:00 PM  Result Value Ref Range   Glucose-Capillary 148 (H) 70 - 99 mg/dL    Comment: Glucose reference range applies only to samples taken after fasting for at least 8 hours.  CBC     Status: Abnormal   Collection Time: 11/17/22  3:37 PM  Result Value Ref Range   WBC 11.5 (H) 4.0 - 10.5 K/uL   RBC 4.52 3.87 - 5.11 MIL/uL   Hemoglobin 14.0 12.0 - 15.0 g/dL   HCT 16.1 09.6 - 04.5 %   MCV 93.6 80.0 - 100.0 fL   MCH 31.0 26.0 - 34.0 pg   MCHC 33.1 30.0 - 36.0 g/dL   RDW 40.9 81.1 - 91.4 %   Platelets 317 150 -  400 K/uL   nRBC 0.0 0.0 - 0.2 %    Comment: Performed at [facility], [address], [place], [place] [address]  Basic metabolic panel     Status: Abnormal   Collection Time: 11/17/22  3:37 PM  Result Value Ref Range   Sodium 138 135 - 145 mmol/L   Potassium 4.0 3.5 - 5.1 mmol/L   Chloride 106 98 - 111 mmol/L   CO2 23 22 - 32 mmol/L   Glucose, Bld 111 (H) 70 - 99 mg/dL    Comment:  Glucose reference range applies only to samples taken after fasting for at least 8 hours.   BUN 18 8 - 23 mg/dL   Creatinine, Ser 5.28 0.44 - 1.00 mg/dL   Calcium 9.5 8.9 - 41.3 mg/dL   GFR, Estimated >24 >40 mL/min    Comment: (NOTE) Calculated using the CKD-EPI Creatinine Equation (2021)    Anion gap 9 5 - 15    Comment: Performed at [facility], [address] [place], [place] [address]  Glucose, capillary     Status: Abnormal   Collection Time: 11/24/22  6:44 AM  Result Value Ref Range   Glucose-Capillary 114 (H) 70 - 99 mg/dL    Comment: Glucose reference range applies only to samples taken after fasting for at least 8 hours.  Glucose, capillary     Status: Abnormal   Collection Time: 11/24/22 11:42 AM  Result Value Ref Range   Glucose-Capillary 134 (H) 70 - 99 mg/dL    Comment: Glucose reference range applies only to samples taken after fasting for at least 8 hours.  COMPLETE METABOLIC PANEL WITH GFR     Status: Abnormal   Collection Time: 12/27/22  1:59 PM  Result Value Ref Range   Glucose, Bld 105 (H) 65 - 99 mg/dL    Comment: .            Fasting reference interval . For someone without known diabetes, a glucose value between 100 and 125 mg/dL is consistent with prediabetes and should be confirmed with a follow-up test. .    BUN 20 7 - 25 mg/dL   Creat 5.36 6.44 - 0.34 mg/dL   eGFR 97 > OR = 60 VQ/[id number]   BUN/Creatinine Ratio SEE NOTE: 6 - 22 (calc)    Comment:    Not Reported: BUN and Creatinine are within    reference range. .    Sodium 139 135 - 146 mmol/L    Potassium 4.6 3.5 - 5.3 mmol/L   Chloride 105 98 - 110 mmol/L   CO2 27 20 - 32 mmol/L   Calcium 10.0 8.6 - 10.4 mg/dL   Total Protein 6.6 6.1 - 8.1 g/dL   Albumin 4.5 3.6 - 5.1 g/dL   Globulin 2.1 1.9 - 3.7 g/dL (calc)   AG Ratio 2.1 1.0 - 2.5 (calc)   Total Bilirubin 0.3 0.2 - 1.2 mg/dL   Alkaline phosphatase (APISO) 66 37 - 153 U/L   AST 21 10 - 35 U/L   ALT 33 (H) 6 - 29 U/L  CBC with Differential/Platelet     Status: Abnormal   Collection Time: 12/27/22  1:59 PM  Result Value Ref Range   WBC 14.6 (H) 3.8 - 10.8 Thousand/uL   RBC 4.74 3.80 - 5.10 Million/uL   Hemoglobin 14.6 11.7 - 15.5 g/dL   HCT 56.4 33.2 - 95.1 %   MCV 92.0 80.0 - 100.0 fL   MCH 30.8 27.0 - 33.0 pg   MCHC 33.5 32.0 - 36.0 g/dL    Comment: For adults, a slight decrease in the calculated MCHC value (in the range of 30 to 32 g/dL) is most likely not clinically significant; however, it should be interpreted with caution in correlation with other red cell parameters and the patient's clinical condition.    RDW 13.5 11.0 - 15.0 %   Platelets 342 140 - 400 Thousand/uL   MPV 10.9 7.5 - 12.5 fL   Neutro Abs 9,198 (H) 1,500 - 7,800 cells/uL   Absolute Lymphocytes  3,650 850 - 3,900 cells/uL   Absolute Monocytes 934 200 - 950 cells/uL   Eosinophils Absolute 672 (H) 15 - 500 cells/uL   Basophils Absolute 146 0 - 200 cells/uL   Neutrophils Relative % 63.0 %   Total Lymphocyte 25.0 %   Monocytes Relative 6.4 %   Eosinophils Relative 4.6 %   Basophils Relative 1.0 %   Smear Review      Comment: Review of peripheral smear confirms automated results.      Psychiatric Specialty Exam: Physical Exam  Review of Systems  Weight 159 lb (72.1 kg).There is no height or weight on file to calculate BMI.  General Appearance: Fairly Groomed  Eye Contact:  Good  Speech:  Slow  Volume:  Normal  Mood:  Euthymic  Affect:  Flat  Thought Process:  Goal Directed  Orientation:  Full (Time, Place, and Person)  Thought  Content:  Rumination  Suicidal Thoughts:  No  Homicidal Thoughts:  No  Memory:  Immediate;   Good Recent;   Good Remote;   Fair  Judgement:  Intact  Insight:  Present  Psychomotor Activity:  Normal  Concentration:  Concentration: Good and Attention Span: Good  Recall:  Good  Fund of Knowledge:  Good  Language:  Good  Akathisia:  No  Handed:  Right  AIMS (if indicated):     Assets:  Communication Skills Desire for Improvement Housing Social Support Transportation  ADL's:  Intact  Cognition:  WNL  Sleep:  ok     Assessment/Plan: MDD (major depressive disorder), recurrent episode, moderate (HCC) - Plan: sertraline (ZOLOFT) 100 MG tablet  Anxiety - Plan: sertraline (ZOLOFT) 100 MG tablet  Grief - Plan: sertraline (ZOLOFT) 100 MG tablet  We discussed to change the medication but patient like to try increased dose of Zoloft before she switched to something different.  In the past she had tried Prozac, Lexapro, Wellbutrin, Cymbalta but either she had side effects or did not work.  We will try Zoloft 100 mg however if do not feel any improvement in her mood then we will consider switching to Trintellix.  I also discussed to talk to her therapist about coping skills.  Discussed need to go more outside, exercise, walking.  Patient agreed with the plan.  She will call us back if she have any worsening of symptoms otherwise we will follow up in 2 months.   Follow Up Instructions:     I discussed the assessment and treatment plan with the patient. The patient was provided an opportunity to ask questions and all were answered. The patient agreed with the plan and demonstrated an understanding of the instructions.   The patient was advised to call back or seek an in-person evaluation if the symptoms worsen or if the condition fails to improve as anticipated.    Collaboration of Care: Other provider involved in patient's care AEB notes are available in epic to review  Patient/Guardian  was advised Release of Information must be obtained prior to any record release in order to collaborate their care with an outside provider. Patient/Guardian was advised if they have not already done so to contact the registration department to sign all necessary forms in order for [place] to release information regarding their care.   Consent: Patient/Guardian gives verbal consent for treatment and assignment of benefits for services provided during this visit. Patient/Guardian expressed understanding and agreed to proceed.     I provided 21 minutes of non face to face time during this  encounter.  Note: This document was prepared by [employer] voice dictation technology and any errors that results from this process are unintentional.    Cleotis Nipper, MD 01/17/2023

## 2023-01-18 DIAGNOSIS — M5416 Radiculopathy, lumbar region: Secondary | ICD-10-CM | POA: Diagnosis not present

## 2023-01-18 DIAGNOSIS — M48061 Spinal stenosis, lumbar region without neurogenic claudication: Secondary | ICD-10-CM | POA: Diagnosis not present

## 2023-01-19 DIAGNOSIS — G4733 Obstructive sleep apnea (adult) (pediatric): Secondary | ICD-10-CM | POA: Diagnosis not present

## 2023-01-23 ENCOUNTER — Encounter (HOSPITAL_COMMUNITY): Payer: Self-pay

## 2023-01-24 ENCOUNTER — Other Ambulatory Visit (HOSPITAL_COMMUNITY): Payer: Self-pay

## 2023-01-24 ENCOUNTER — Other Ambulatory Visit: Payer: Self-pay | Admitting: *Deleted

## 2023-01-24 DIAGNOSIS — Z79899 Other long term (current) drug therapy: Secondary | ICD-10-CM

## 2023-01-24 DIAGNOSIS — F331 Major depressive disorder, recurrent, moderate: Secondary | ICD-10-CM

## 2023-01-24 DIAGNOSIS — F4321 Adjustment disorder with depressed mood: Secondary | ICD-10-CM

## 2023-01-24 DIAGNOSIS — F419 Anxiety disorder, unspecified: Secondary | ICD-10-CM

## 2023-01-24 MED ORDER — SERTRALINE HCL 100 MG PO TABS
100.0000 mg | ORAL_TABLET | Freq: Every day | ORAL | 1 refills | Status: DC
Start: 2023-01-24 — End: 2023-03-25

## 2023-01-26 DIAGNOSIS — G43711 Chronic migraine without aura, intractable, with status migrainosus: Secondary | ICD-10-CM | POA: Diagnosis not present

## 2023-01-27 DIAGNOSIS — E7800 Pure hypercholesterolemia, unspecified: Secondary | ICD-10-CM | POA: Diagnosis not present

## 2023-01-27 DIAGNOSIS — E049 Nontoxic goiter, unspecified: Secondary | ICD-10-CM | POA: Diagnosis not present

## 2023-01-27 DIAGNOSIS — Z79899 Other long term (current) drug therapy: Secondary | ICD-10-CM | POA: Diagnosis not present

## 2023-01-27 DIAGNOSIS — E1165 Type 2 diabetes mellitus with hyperglycemia: Secondary | ICD-10-CM | POA: Diagnosis not present

## 2023-01-27 DIAGNOSIS — E559 Vitamin D deficiency, unspecified: Secondary | ICD-10-CM | POA: Diagnosis not present

## 2023-01-28 LAB — COMPLETE METABOLIC PANEL WITH GFR
AG Ratio: 2 (calc) (ref 1.0–2.5)
ALT: 22 U/L (ref 6–29)
AST: 18 U/L (ref 10–35)
Albumin: 4.4 g/dL (ref 3.6–5.1)
Alkaline phosphatase (APISO): 74 U/L (ref 37–153)
BUN: 12 mg/dL (ref 7–25)
CO2: 27 mmol/L (ref 20–32)
Calcium: 9.7 mg/dL (ref 8.6–10.4)
Chloride: 106 mmol/L (ref 98–110)
Creat: 0.68 mg/dL (ref 0.50–1.05)
Globulin: 2.2 g/dL (ref 1.9–3.7)
Glucose, Bld: 117 mg/dL — ABNORMAL HIGH (ref 65–99)
Potassium: 4.5 mmol/L (ref 3.5–5.3)
Sodium: 142 mmol/L (ref 135–146)
Total Bilirubin: 0.3 mg/dL (ref 0.2–1.2)
Total Protein: 6.6 g/dL (ref 6.1–8.1)
eGFR: 97 mL/min/{1.73_m2} (ref >=60)

## 2023-01-28 NOTE — Progress Notes (Signed)
CMP is normal.  Glucose is mildly elevated, probably not a fasting sample.

## 2023-02-01 ENCOUNTER — Encounter: Payer: Self-pay | Admitting: Family Medicine

## 2023-02-01 ENCOUNTER — Ambulatory Visit: Payer: Medicare PPO | Admitting: Family Medicine

## 2023-02-01 VITALS — BP 111/75 | HR 91 | Temp 97.3°F | Ht 64.0 in | Wt 163.0 lb

## 2023-02-01 DIAGNOSIS — R519 Headache, unspecified: Secondary | ICD-10-CM

## 2023-02-01 DIAGNOSIS — J0141 Acute recurrent pansinusitis: Secondary | ICD-10-CM

## 2023-02-01 DIAGNOSIS — H9203 Otalgia, bilateral: Secondary | ICD-10-CM | POA: Diagnosis not present

## 2023-02-01 DIAGNOSIS — D72820 Lymphocytosis (symptomatic): Secondary | ICD-10-CM

## 2023-02-01 DIAGNOSIS — H6993 Unspecified Eustachian tube disorder, bilateral: Secondary | ICD-10-CM

## 2023-02-01 MED ORDER — PREDNISONE 20 MG PO TABS
40.0000 mg | ORAL_TABLET | Freq: Every day | ORAL | 0 refills | Status: AC
Start: 2023-02-01 — End: 2023-02-06

## 2023-02-01 MED ORDER — CEFDINIR 300 MG PO CAPS
300.0000 mg | ORAL_CAPSULE | Freq: Two times a day (BID) | ORAL | 0 refills | Status: DC
Start: 2023-02-01 — End: 2023-02-11

## 2023-02-01 NOTE — Progress Notes (Signed)
Subjective:  Patient ID: Christina Lewis, female    DOB: Jan 24, 1956, 67 y.o.   MRN: [id number]  Patient Care Team: Sonny Masters, FNP as PCP - General (Family Medicine) Smitty Cords, OD (Optometry)   Chief Complaint:  Cough and Nasal Congestion (X 2 months )   HPI: Christina Lewis is a 67 y.o. female presenting on 02/01/2023 for Cough and Nasal Congestion (X 2 months )   Discussed the use of AI scribe software for clinical note transcription with the patient, who gave verbal consent to proceed.  History of Present Illness   The patient, with a history of sinus issues, presents with recurrent symptoms despite previous treatment with antibiotics and steroids. They describe a pattern of improvement following treatment, only to relapse within a week or two. Symptoms include blocked ears, a sensation of struggling to breathe, and constant need to clear the throat, blow the nose, and snore. These symptoms have been disruptive to their sleep.  The patient had seen an ENT specialist a few weeks prior, who found no significant issues at the time. However, the patient's symptoms have since returned. A CT scan was performed, but the patient fell ill after the scan. The patient also mentions difficulty controlling their blood sugar levels and has an upcoming appointment with their endocrinologist.  The patient has been using Flonase for their nasal symptoms, having switched from another nasal spray due to excessive dryness. They have also been taking Xyzal, Sudafed, and Mucinex, and using saline solution to alleviate their symptoms.  The patient also reports a sensation of fluid in their right ear and a swollen lymph node. They have a history of elevated liver enzymes and white blood cell count. The patient is scheduled to see the ENT specialist again soon.          Relevant past medical, surgical, family, and social history reviewed and updated as indicated.  Allergies and medications  reviewed and updated. Data reviewed: Chart in Epic.   Past Medical History:  Diagnosis Date   Allergy    Anemia    Anxiety    Arthritis    Depression    Diabetes mellitus    type 2   Fibromyalgia    GERD (gastroesophageal reflux disease)    Gout    no current problem   Hematuria    History of kidney stones    passed stones and also had surgery to remove one   History of tics    Ineffective esophageal motility    tx with prilosec, essophageal was stretched   Interstitial cystitis 2016   Migraines    Osteoarthritis    PCOS (polycystic ovarian syndrome) 1993   Pneumonia 2019   PONV (postoperative nausea and vomiting)    Sleep apnea    does not use CPAP   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, took medication for a year   Thyroid nodule    Dr Horald Pollen is monitoring every year    Past Surgical History:  Procedure Laterality Date   ABDOMINAL HYSTERECTOMY  2000   APPENDECTOMY  1976   BALLOON DILATION N/A 11/03/2021   Procedure: Marvis Repress DILATION;  Surgeon: Kerin Salen, MD;  Location: [facility];  Service: Gastroenterology;  Laterality: N/A;   BOTOX INJECTION N/A 11/03/2021   Procedure: BOTOX INJECTION;  Surgeon: Kerin Salen, MD;  Location: [facility];  Service: Gastroenterology;  Laterality: N/A;   BREAST LUMPECTOMY  1989   lt-negative   CARDIOVASCULAR  STRESS TEST  2000   CATARACT EXTRACTION W/PHACO Left 12/07/2019   Procedure: CATARACT EXTRACTION PHACO AND INTRAOCULAR LENS PLACEMENT (IOC);  Surgeon: [name], MD;  Location: [facility];  Service: Ophthalmology;  Laterality: Left;  CDE: 5.55   CHOLECYSTECTOMY  1985   COLONOSCOPY     DRUG INDUCED ENDOSCOPY Bilateral 09/29/2022   Procedure: DRUG INDUCED ENDOSCOPY;  Surgeon: Ashok Croon, MD;  Location: [facility];  Service: ENT;  Laterality: Bilateral;  15 MINUTES   ESOPHAGEAL MANOMETRY N/A 08/24/2021   Procedure: ESOPHAGEAL MANOMETRY (EM);  Surgeon: Kerin Salen, MD;  Location: [facility];   Service: Gastroenterology;  Laterality: N/A;   ESOPHAGOGASTRODUODENOSCOPY N/A 11/03/2021   Procedure: ESOPHAGOGASTRODUODENOSCOPY (EGD);  Surgeon: Kerin Salen, MD;  Location: [facility];  Service: Gastroenterology;  Laterality: N/A;   HYSTERECTOMY ABDOMINAL WITH SALPINGECTOMY Bilateral 2000   IMPLANTATION OF HYPOGLOSSAL NERVE STIMULATOR Right 11/24/2022   Procedure: IMPLANTATION OF HYPOGLOSSAL NERVE STIMULATOR;  Surgeon: Ashok Croon, MD;  Location: [facility];  Service: ENT;  Laterality: Right;   KIDNEY STONE SURGERY Right 2014   with stent placement in OR   KNEE ARTHROSCOPY Right 08/22/2013   Procedure: ARTHROSCOPY RIGHT KNEE FOR INFECTION LAVAGE AND DRAINAGE;  Surgeon: Thera Flake., MD;  Location: [facility];  Service: Orthopedics;  Laterality: Right;   KNEE ARTHROSCOPY WITH PATELLA RECONSTRUCTION Right 08/06/2013   Procedure: RIGHT KNEE ARTHROSCOPY WITH MENISCECTOMY MEDIAL, ARTHROSCOPY KNEE WITH DEBRIDEMENT/SHAVING (CHONDROPLASTY) ;  Surgeon: Thera Flake., MD;  Location: [facility];  Service: Orthopedics;  Laterality: Right;   LUMBAR LAMINECTOMY/DECOMPRESSION MICRODISCECTOMY Right 09/11/2019   Procedure: Right Lumbar Three-Four Microdiscectomy;  Surgeon: Maeola Harman, MD;  Location: [facility];  Service: Neurosurgery;  Laterality: Right;  Right Lumbar Three-Four Microdiscectomy   NASAL SINUS SURGERY     x4   OOPHORECTOMY     SHOULDER SURGERY  2011   right shoulder   THYROID SURGERY  1994   TONSILLECTOMY Bilateral 1974    Social History   Socioeconomic History   Marital status: Widowed    Spouse name: Chrissie Noa   Number of children: Not on file   Years of education: Not on file   Highest education level: Bachelor's degree (e.g., BA, AB, BS)  Occupational History   Occupation: retired  Tobacco Use   Smoking status: Never    Passive exposure: Past   Smokeless tobacco: Never  Vaping Use   Vaping status: Never Used  Substance and Sexual Activity    Alcohol use: No   Drug use: Never   Sexual activity: Yes    Birth control/protection: Surgical    Comment: Hysterectomy  Other Topics Concern   Not on file  Social History Narrative   Caffiene 4-5 12 oz cans soda.   Working retired - special ed.   Live with home no kids   Social Determinants of Health   Financial Resource Strain: Low Risk  (12/13/2022)   Overall Financial Resource Strain (CARDIA)    Difficulty of Paying Living Expenses: Not very hard  Food Insecurity: No Food Insecurity (12/13/2022)   Hunger Vital Sign    Worried About Running Out of Food in the Last Year: Never true    Ran Out of Food in the Last Year: Never true  Transportation Needs: No Transportation Needs (12/13/2022)   PRAPARE - Administrator, Civil Service (Medical): No    Lack of Transportation (Non-Medical): No  Physical Activity: Inactive (12/13/2022)   Exercise Vital Sign  Days of Exercise per Week: 0 days    Minutes of Exercise per Session: 0 min  Stress: Stress Concern Present (12/13/2022)   [employer] of Occupational Health - Occupational Stress Questionnaire    Feeling of Stress : Rather much  Social Connections: Moderately Isolated (12/13/2022)   Social Connection and Isolation Panel [NHANES]    Frequency of Communication with Friends and Family: Twice a week    Frequency of Social Gatherings with Friends and Family: More than three times a week    Attends Religious Services: 1 to 4 times per year    Active Member of [employer] or Organizations: No    Attends Banker Meetings: Never    Marital Status: Widowed  Intimate Partner Violence: Not At Risk (02/09/2022)   Humiliation, Afraid, Rape, and Kick questionnaire    Fear of Current or Ex-Partner: No    Emotionally Abused: No    Physically Abused: No    Sexually Abused: No    Outpatient Encounter Medications as of 02/01/2023  Medication Sig   acetaminophen (TYLENOL) 650 MG CR tablet Take 1,300 mg by mouth  every 8 (eight) hours as needed for pain.   antiseptic oral rinse (BIOTENE) LIQD 15 mLs by Mouth Rinse route as needed for dry mouth.   aspirin EC 81 MG tablet Take 81 mg by mouth daily. Swallow whole.   baclofen (LIORESAL) 10 MG tablet TAKE 1 TABLET BY MOUTH EACH MORNING AND 1 TABLET AT NOON.   cefdinir (OMNICEF) 300 MG capsule Take 1 capsule (300 mg total) by mouth 2 (two) times daily. 1 po BID   cholecalciferol (VITAMIN D) 25 MCG (1000 UNIT) tablet Take 1,000 Units by mouth daily.   desloratadine (CLARINEX) 5 MG tablet Take 1 tablet (5 mg total) by mouth daily.   Eptinezumab-jjmr (VYEPTI) 100 MG/ML injection Inject into the vein every 3 (three) months.   ferrous sulfate 325 (65 FE) MG tablet Take 325 mg by mouth daily.   folic acid (FOLVITE) 1 MG tablet Take 2 tablets (2 mg total) by mouth daily.   gabapentin (NEURONTIN) 300 MG capsule Take 300 mg by mouth 3 (three) times daily.   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   ibuprofen (ADVIL) 200 MG tablet Take 400 mg by mouth every 8 (eight) hours as needed for moderate pain.   JARDIANCE 10 MG TABS tablet Take 10 mg by mouth daily.   MAGNESIUM PO Take 500 mg by mouth at bedtime.   Methotrexate, PF, (RASUVO) 20 MG/0.4ML SOAJ Inject 20 mg into the skin once a week.   Multiple Vitamin (MULTIVITAMIN WITH MINERALS) TABS tablet Take 1 tablet by mouth daily.   omeprazole (PRILOSEC) 20 MG capsule Take 40 mg by mouth daily.   ondansetron (ZOFRAN-ODT) 8 MG disintegrating tablet Take 8 mg by mouth every 8 (eight) hours as needed for nausea.    Polyvinyl Alcohol-Povidone (REFRESH OP) Apply to eye as needed.   predniSONE (DELTASONE) 20 MG tablet Take 2 tablets (40 mg total) by mouth daily with breakfast for 5 days. 2 po daily for 5 days   Rimegepant Sulfate (NURTEC) 75 MG TBDP Take 1 tablet (75 mg total) by mouth daily as needed. For migraines. Take as close to onset of migraine as  possible. One daily maximum.   sertraline (ZOLOFT) 100 MG tablet Take 1 tablet (100 mg total) by mouth daily.   tiZANidine (ZANAFLEX) 4 MG tablet TAKE ONE TABLET BY MOUTH  AT BEDTIME AS NEEDED   TURMERIC-GINGER PO Take 1 tablet by mouth daily.   [DISCONTINUED] azelastine (ASTELIN) 0.1 % nasal spray Place 2 sprays into both nostrils 2 (two) times daily. Use in each nostril as directed   [DISCONTINUED] butalbital-acetaminophen-caffeine (FIORICET) 50-325-40 MG tablet Take 1-2 tablets by mouth every 6 (six) hours as needed for headache.   [DISCONTINUED] Chlorphen-PE-Acetaminophen 4-10-325 MG TABS Take 1 tablet by mouth every 6 (six) hours as needed.   [DISCONTINUED] methylPREDNISolone (MEDROL DOSEPAK) 4 MG TBPK tablet Take with signs of chronic sinusitis and take as directed   [DISCONTINUED] oxyCODONE (ROXICODONE) 5 MG immediate release tablet Take 1 tablet (5 mg total) by mouth every 6 (six) hours as needed for severe pain.   [DISCONTINUED] urea (CARMOL) 10 % cream Apply topically as needed.   No facility-administered encounter medications on file as of 02/01/2023.    Allergies  Allergen Reactions   Sulfa Antibiotics Anaphylaxis   Sulfamethoxazole-Trimethoprim Anaphylaxis   Atorvastatin Other (See Comments)    Severe Muscle and Joint pain with ALL statins    Codeine Itching   Erythromycin Nausea And Vomiting   Metronidazole Diarrhea   Morphine And Codeine Itching   Semaglutide Nausea And Vomiting    Ozempic*    Pertinent ROS per HPI, otherwise unremarkable      Objective:  BP 111/75   Pulse 91   Temp (!) 97.3 F (36.3 C) (Temporal)   Ht 5\' 4"  (1.626 m)   Wt 163 lb (73.9 kg)   SpO2 97%   BMI 27.98 kg/m    Wt Readings from Last 3 Encounters:  02/01/23 163 lb (73.9 kg)  12/15/22 159 lb (72.1 kg)  12/08/22 158 lb 3.2 oz (71.8 kg)    Physical Exam Vitals and nursing note reviewed.  Constitutional:      General: She is not in acute distress.    Appearance: Normal  appearance. She is not ill-appearing, toxic-appearing or diaphoretic.  HENT:     Head: Normocephalic and atraumatic.     Right Ear: A middle ear effusion is present. Tympanic membrane is not erythematous.     Left Ear: A middle ear effusion is present. Tympanic membrane is not erythematous.     Nose: Congestion present.     Right Turbinates: Swollen.     Left Turbinates: Swollen.     Right Sinus: Maxillary sinus tenderness and frontal sinus tenderness present.     Left Sinus: Maxillary sinus tenderness and frontal sinus tenderness present.     Mouth/Throat:     Lips: Pink.     Mouth: Mucous membranes are moist.     Pharynx: Posterior oropharyngeal erythema and postnasal drip present. No pharyngeal swelling, oropharyngeal exudate or uvula swelling.  Eyes:     Conjunctiva/sclera: Conjunctivae normal.     Pupils: Pupils are equal, round, and reactive to light.  Cardiovascular:     Rate and Rhythm: Normal rate and regular rhythm.  Pulmonary:     Effort: Pulmonary effort is normal.     Breath sounds: Normal breath sounds.  Musculoskeletal:     Right lower leg: No edema.     Left lower leg: No edema.  Lymphadenopathy:     Cervical: Cervical adenopathy present.  Skin:    General: Skin is warm and dry.     Capillary Refill: Capillary refill takes less than 2 seconds.  Neurological:     General: No focal deficit present.     Mental Status: She is alert and oriented to person,  place, and time.  Psychiatric:        Mood and Affect: Mood normal.        Behavior: Behavior normal.        Thought Content: Thought content normal.        Judgment: Judgment normal.      Results for orders placed or performed in visit on 01/24/23  COMPLETE METABOLIC PANEL WITH GFR  Result Value Ref Range   Glucose, Bld 117 (H) 65 - 99 mg/dL   BUN 12 7 - 25 mg/dL   Creat 1.61 0.96 - 0.45 mg/dL   eGFR 97 > OR = 60 [id number]/[id number]/1.91Y7   BUN/Creatinine Ratio SEE NOTE: 6 - 22 (calc)   Sodium 142 135 - 146 mmol/L    Potassium 4.5 3.5 - 5.3 mmol/L   Chloride 106 98 - 110 mmol/L   CO2 27 20 - 32 mmol/L   Calcium 9.7 8.6 - 10.4 mg/dL   Total Protein 6.6 6.1 - 8.1 g/dL   Albumin 4.4 3.6 - 5.1 g/dL   Globulin 2.2 1.9 - 3.7 g/dL (calc)   AG Ratio 2.0 1.0 - 2.5 (calc)   Total Bilirubin 0.3 0.2 - 1.2 mg/dL   Alkaline phosphatase (APISO) 74 37 - 153 U/L   AST 18 10 - 35 U/L   ALT 22 6 - 29 U/L       Pertinent labs & imaging results that were available during my care of the patient were reviewed by me and considered in my medical decision making.  Assessment & Plan:  Deema was seen today for cough and nasal congestion.  Diagnoses and all orders for this visit:  Sinus headache -     cefdinir (OMNICEF) 300 MG capsule; Take 1 capsule (300 mg total) by mouth 2 (two) times daily. 1 po BID -     predniSONE (DELTASONE) 20 MG tablet; Take 2 tablets (40 mg total) by mouth daily with breakfast for 5 days. 2 po daily for 5 days  Ear pain, bilateral -     cefdinir (OMNICEF) 300 MG capsule; Take 1 capsule (300 mg total) by mouth 2 (two) times daily. 1 po BID -     predniSONE (DELTASONE) 20 MG tablet; Take 2 tablets (40 mg total) by mouth daily with breakfast for 5 days. 2 po daily for 5 days  Dysfunction of both eustachian tubes -     cefdinir (OMNICEF) 300 MG capsule; Take 1 capsule (300 mg total) by mouth 2 (two) times daily. 1 po BID -     predniSONE (DELTASONE) 20 MG tablet; Take 2 tablets (40 mg total) by mouth daily with breakfast for 5 days. 2 po daily for 5 days  Lymphocytosis -     Anemia Profile B  Acute recurrent pansinusitis -     cefdinir (OMNICEF) 300 MG capsule; Take 1 capsule (300 mg total) by mouth 2 (two) times daily. 1 po BID -     predniSONE (DELTASONE) 20 MG tablet; Take 2 tablets (40 mg total) by mouth daily with breakfast for 5 days. 2 po daily for 5 days     Assessment and Plan    Chronic Sinusitis Recurrent sinus issues with nasal congestion, ear fullness, and difficulty  breathing. Previous treatments with antibiotics and steroids provided temporary relief. Recent CT scan showed no significant findings. ENT evaluation indicated resolution, but symptoms have recurred. Current medications include Flonase, Xyzal, Sudafed, and Mucinex. Fluid behind the right ear and swollen lymph nodes noted. No fever, but  experiencing chills and sweats. Discussed risks of steroids on blood sugar levels and potential benefits for sinus congestion. Patient prefers to avoid steroids unless necessary due to blood sugar concerns. - Prescribe Cefdinir 300 mg twice daily for 10 days - Continue Flonase nasal spray - Try neti pot for sinus rinsing - Follow up with ENT on Friday - If endocrinologist approves, start a short course of steroids for sinus congestion  Diabetes Mellitus Difficulty controlling blood sugar levels. Recent A1c was 6.2. Discussed potential impact of steroids on blood sugar control and patient's preference to avoid steroids unless necessary. - Follow up with endocrinologist tomorrow - If A1c is well controlled and endocrinologist approves, start a short course of steroids for sinus congestion  Leukocytosis Elevated white blood cell count noted in recent blood work (14.6 in November, 11.5 in September). No recent hematology follow-up. - Order anemia profile - Monitor white blood cell count  General Health Maintenance Recent blood work for liver enzymes due to elevated levels. - Monitor liver enzymes  Follow-up - Follow up with ENT on Friday - Follow up with endocrinologist tomorrow - Send message to primary care provider if endocrinologist approves short course of steroids.          Continue all other maintenance medications.  Follow up plan: Return if symptoms worsen or fail to improve.   Continue healthy lifestyle choices, including diet (rich in fruits, vegetables, and lean proteins, and low in salt and simple carbohydrates) and exercise (at least 30  minutes of moderate physical activity daily).   The above assessment and management plan was discussed with the patient. The patient verbalized understanding of and has agreed to the management plan. Patient is aware to call the clinic if they develop any new symptoms or if symptoms persist or worsen. Patient is aware when to return to the clinic for a follow-up visit. Patient educated on when it is appropriate to go to the emergency department.   Kari Baars, FNP-C [facility] [phone]

## 2023-02-02 DIAGNOSIS — E7800 Pure hypercholesterolemia, unspecified: Secondary | ICD-10-CM | POA: Diagnosis not present

## 2023-02-02 DIAGNOSIS — E049 Nontoxic goiter, unspecified: Secondary | ICD-10-CM | POA: Diagnosis not present

## 2023-02-02 DIAGNOSIS — R1310 Dysphagia, unspecified: Secondary | ICD-10-CM | POA: Diagnosis not present

## 2023-02-02 DIAGNOSIS — E282 Polycystic ovarian syndrome: Secondary | ICD-10-CM | POA: Diagnosis not present

## 2023-02-02 DIAGNOSIS — E1165 Type 2 diabetes mellitus with hyperglycemia: Secondary | ICD-10-CM | POA: Diagnosis not present

## 2023-02-02 DIAGNOSIS — M069 Rheumatoid arthritis, unspecified: Secondary | ICD-10-CM | POA: Diagnosis not present

## 2023-02-02 DIAGNOSIS — E559 Vitamin D deficiency, unspecified: Secondary | ICD-10-CM | POA: Diagnosis not present

## 2023-02-02 DIAGNOSIS — Z789 Other specified health status: Secondary | ICD-10-CM | POA: Diagnosis not present

## 2023-02-02 LAB — ANEMIA PROFILE B
Basophils Absolute: 0.1 10*3/uL (ref 0.0–0.2)
Basos: 1 %
EOS (ABSOLUTE): 0.3 10*3/uL (ref 0.0–0.4)
Eos: 3 %
Ferritin: 155 ng/mL — ABNORMAL HIGH (ref 15–150)
Folate: >20 ng/mL (ref >3.0)
Hematocrit: 41.9 % (ref 34.0–46.6)
Hemoglobin: 14 g/dL (ref 11.1–15.9)
Immature Grans (Abs): 0.1 10*3/uL (ref 0.0–0.1)
Immature Granulocytes: 1 %
Iron Saturation: 22 % (ref 15–55)
Iron: 67 ug/dL (ref 27–139)
Lymphocytes Absolute: 2.7 10*3/uL (ref 0.7–3.1)
Lymphs: 26 %
MCH: 31.4 pg (ref 26.6–33.0)
MCHC: 33.4 g/dL (ref 31.5–35.7)
MCV: 94 fL (ref 79–97)
Monocytes Absolute: 0.6 10*3/uL (ref 0.1–0.9)
Monocytes: 6 %
Neutrophils Absolute: 6.6 10*3/uL (ref 1.4–7.0)
Neutrophils: 63 %
Platelets: 347 10*3/uL (ref 150–450)
RBC: 4.46 x10E6/uL (ref 3.77–5.28)
RDW: 14 % (ref 11.7–15.4)
Retic Ct Pct: 2.3 % (ref 0.6–2.6)
Total Iron Binding Capacity: 304 ug/dL (ref 250–450)
UIBC: 237 ug/dL (ref 118–369)
Vitamin B-12: 816 pg/mL (ref 232–1245)
WBC: 10.4 10*3/uL (ref 3.4–10.8)

## 2023-02-04 ENCOUNTER — Encounter (INDEPENDENT_AMBULATORY_CARE_PROVIDER_SITE_OTHER): Payer: Self-pay | Admitting: Otolaryngology

## 2023-02-04 ENCOUNTER — Ambulatory Visit (INDEPENDENT_AMBULATORY_CARE_PROVIDER_SITE_OTHER): Payer: Medicare PPO | Admitting: Otolaryngology

## 2023-02-04 VITALS — BP 123/67 | HR 77

## 2023-02-04 DIAGNOSIS — R0982 Postnasal drip: Secondary | ICD-10-CM

## 2023-02-04 DIAGNOSIS — R0981 Nasal congestion: Secondary | ICD-10-CM

## 2023-02-04 DIAGNOSIS — J3089 Other allergic rhinitis: Secondary | ICD-10-CM

## 2023-02-04 DIAGNOSIS — G4733 Obstructive sleep apnea (adult) (pediatric): Secondary | ICD-10-CM

## 2023-02-04 DIAGNOSIS — Z9889 Other specified postprocedural states: Secondary | ICD-10-CM

## 2023-02-04 DIAGNOSIS — H6993 Unspecified Eustachian tube disorder, bilateral: Secondary | ICD-10-CM

## 2023-02-04 NOTE — Progress Notes (Signed)
Office Visit Note  Patient: Christina Lewis             Date of Birth: [birth year]           MRN: [id number]             PCP: Sonny Masters, FNP Referring: Sonny Masters, FNP Visit Date: 02/18/2023 Occupation: @GUAROCC @  Subjective:  Recurrent sinusitis   History of Present Illness: Christina Lewis is a 67 y.o. female with history of seronegative rheumatoid arthritis and osteoarthritis.  Patient is prescribed Rasuvo 20 mg sq injections once weekly, folic acid 2 mg daily, and Plaquenil 200 mg 1 tablet by mouth twice daily Monday through Friday.  Patient reports that she has been experiencing recurrent sinusitis.  She states that she has required 5 courses of antibiotics as well as several steroid tapers.  She has been under the care of ENT who is considering proceeding with an MRI of the sinuses for further evaluation.  Patient has been taking Benadryl on a daily basis but has had persistent congestion and postnasal drip.  Patient states that she completed her last antibiotic 8 days ago and has had worsening congestion every day since.  Patient states that she held Rasuvo for about 1 month but states that she administered 1 dose of Rasuvo on 02/16/2023 since she was hoping her symptoms were resolved.  Patient states that she continues to experience intermittent arthralgias and joint stiffness during the cooler weather months.  She is having myofascial pain consistent with fibromyalgia as well as ongoing fatigue.  She has been having interrupted sleep at night due to nocturnal pain.  She has been taking Tylenol as needed.  She is scheduled for a lower back injection in January 2025. Patient reports that last Friday she started to experience redness and itching on the dorsal aspect of her right hand particularly over the right thumb.  Patient states that she tried an over-the-counter steroid cream with no improvement so she was evaluated at urgent care on 02/14/2023.  She was prescribed a prescription  topical steroid but still has not had any improvement.  She is not experiencing excessive itching to the point that the skin has been breaking open.  Patient states that yesterday she was out shopping and had so much itching that she had to stop to get Benadryl cream to apply.  She does not have a dermatologist but is planning to follow-up with PCP if her symptoms persist or worsen.  Activities of Daily Living:  Patient reports morning stiffness for 4 hours.   Patient Reports nocturnal pain.  Difficulty dressing/grooming: Denies Difficulty climbing stairs: Reports Difficulty getting out of chair: Reports Difficulty using hands for taps, buttons, cutlery, and/or writing: Reports  Review of Systems  Constitutional:  Positive for fatigue.  HENT:  Positive for mouth sores and mouth dryness.   Eyes:  Positive for dryness.  Respiratory:  Negative for shortness of breath.   Cardiovascular:  Negative for chest pain and palpitations.  Gastrointestinal:  Negative for blood in stool, constipation and diarrhea.  Endocrine: Negative for increased urination.  Genitourinary:  Negative for involuntary urination.  Musculoskeletal:  Positive for joint pain, joint pain, joint swelling, myalgias, morning stiffness, muscle tenderness and myalgias. Negative for gait problem and muscle weakness.  Skin:  Positive for color change and rash. Negative for hair loss and sensitivity to sunlight.  Allergic/Immunologic: Positive for susceptible to infections.  Neurological:  Positive for dizziness and headaches.  Hematological:  Positive  for swollen glands.  Psychiatric/Behavioral:  Positive for depressed mood and sleep disturbance. The patient is not nervous/anxious.     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   Vitamin D deficiency 10/15/2021   Polycystic ovaries 10/15/2021   Migraine without aura and without status migrainosus, not intractable 10/15/2021   BMI 29.0-29.9,adult 10/15/2021   Rheumatoid arthritis (HCC) 03/04/2021   Type 2 diabetes mellitus with obesity (HCC) 03/04/2021   Right foot infection 03/04/2021   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 myelopathy 09/11/2019   Leukocytosis 11/04/2016   Fibromyalgia 02/10/2016   Other fatigue 02/10/2016   Primary osteoarthritis of both hands 02/10/2016   History of migraine 02/10/2016   History of thyroid nodule 02/10/2016   History of sleep apnea 02/10/2016   History of renal calculi 02/10/2016   Pain in joint, shoulder region 10/02/2010   Adhesive capsulitis of shoulder 10/02/2010   Muscle weakness (generalized) 10/02/2010    Past Medical History:  Diagnosis Date   Allergy    Anemia    Anxiety    Arthritis    Depression    Diabetes mellitus    type 2   Fibromyalgia    GERD (gastroesophageal reflux disease)    Gout    no current problem   Hematuria    History of kidney stones    passed stones and also had surgery to remove one   History of tics    Ineffective esophageal motility    tx with prilosec, essophageal was stretched   Interstitial cystitis 2016   Migraines    Osteoarthritis    PCOS (polycystic ovarian syndrome) 1993   Pneumonia 2019   PONV (postoperative nausea and vomiting)    Sleep apnea    does not use CPAP   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, took medication for a year   Thyroid nodule    Dr Horald Pollen is monitoring every year    Family History  Problem Relation Age of Onset   Fibromyalgia Mother    Psoriasis Mother        psoriatic arthritis    Diabetes Mother    Heart disease Mother    Psoriasis Sister        psoriatic arthritis    Diabetes Sister    Migraines Sister    Rheum  arthritis Sister    Fibromyalgia Sister    Diabetes Sister    Past Surgical History:  Procedure Laterality Date   ABDOMINAL HYSTERECTOMY  2000   APPENDECTOMY  1976   BALLOON DILATION N/A 11/03/2021   Procedure: [place];  Surgeon: Kerin Salen, MD;  Location: [facility];  Service: Gastroenterology;  Laterality: N/A;   BOTOX INJECTION N/A 11/03/2021   Procedure: BOTOX INJECTION;  Surgeon: Kerin Salen, MD;  Location: [facility];  Service: Gastroenterology;  Laterality: N/A;   BREAST LUMPECTOMY  1989   lt-negative   CARDIOVASCULAR STRESS TEST  2000   CATARACT EXTRACTION W/PHACO Left 12/07/2019   Procedure: CATARACT EXTRACTION PHACO AND INTRAOCULAR LENS PLACEMENT (IOC);  Surgeon: [name], MD;  Location: [facility];  Service: Ophthalmology;  Laterality: Left;  CDE: 5.55   CHOLECYSTECTOMY  1985   COLONOSCOPY     DRUG INDUCED ENDOSCOPY Bilateral  09/29/2022   Procedure: DRUG INDUCED ENDOSCOPY;  Surgeon: Ashok Croon, MD;  Location: [facility];  Service: ENT;  Laterality: Bilateral;  15 MINUTES   ESOPHAGEAL MANOMETRY N/A 08/24/2021   Procedure: ESOPHAGEAL MANOMETRY (EM);  Surgeon: Kerin Salen, MD;  Location: [facility];  Service: Gastroenterology;  Laterality: N/A;   ESOPHAGOGASTRODUODENOSCOPY N/A 11/03/2021   Procedure: ESOPHAGOGASTRODUODENOSCOPY (EGD);  Surgeon: Kerin Salen, MD;  Location: [facility];  Service: Gastroenterology;  Laterality: N/A;   HYSTERECTOMY ABDOMINAL WITH SALPINGECTOMY Bilateral 2000   IMPLANTATION OF HYPOGLOSSAL NERVE STIMULATOR Right 11/24/2022   Procedure: IMPLANTATION OF HYPOGLOSSAL NERVE STIMULATOR;  Surgeon: Ashok Croon, MD;  Location: [facility];  Service: ENT;  Laterality: Right;   KIDNEY STONE SURGERY Right 2014   with stent placement in OR   KNEE ARTHROSCOPY Right 08/22/2013   Procedure: ARTHROSCOPY RIGHT KNEE FOR INFECTION LAVAGE AND DRAINAGE;  Surgeon: Thera Flake., MD;  Location: [facility];  Service: Orthopedics;   Laterality: Right;   KNEE ARTHROSCOPY WITH PATELLA RECONSTRUCTION Right 08/06/2013   Procedure: RIGHT KNEE ARTHROSCOPY WITH MENISCECTOMY MEDIAL, ARTHROSCOPY KNEE WITH DEBRIDEMENT/SHAVING (CHONDROPLASTY) ;  Surgeon: Thera Flake., MD;  Location: [facility];  Service: Orthopedics;  Laterality: Right;   LUMBAR LAMINECTOMY/DECOMPRESSION MICRODISCECTOMY Right 09/11/2019   Procedure: Right Lumbar Three-Four Microdiscectomy;  Surgeon: Maeola Harman, MD;  Location: [facility];  Service: Neurosurgery;  Laterality: Right;  Right Lumbar Three-Four Microdiscectomy   NASAL SINUS SURGERY     x4   OOPHORECTOMY     SHOULDER SURGERY  2011   right shoulder   THYROID SURGERY  1994   TONSILLECTOMY Bilateral 1974   Social History   Social History Narrative   Caffiene 4-5 12 oz cans soda.   Working retired - special ed.   Live with home no kids   Immunization History  Administered Date(s) Administered   Tdap 03/04/2021   Zoster Recombinant(Shingrix) 10/15/2021     Objective: Vital Signs: BP 121/74 (BP Location: Left Arm, Patient Position: Sitting, Cuff Size: Normal)   Pulse 81   Resp 16   Ht 5\' 4"  (1.626 m)   Wt 164 lb (74.4 kg)   BMI 28.15 kg/m    Physical Exam Vitals and nursing note reviewed.  Constitutional:      Appearance: She is well-developed.  HENT:     Head: Normocephalic and atraumatic.  Eyes:     Conjunctiva/sclera: Conjunctivae normal.  Cardiovascular:     Rate and Rhythm: Normal rate and regular rhythm.     Heart sounds: Normal heart sounds.  Pulmonary:     Effort: Pulmonary effort is normal.     Breath sounds: Normal breath sounds.  Abdominal:     General: Bowel sounds are normal.     Palpations: Abdomen is soft.  Musculoskeletal:     Cervical back: Normal range of motion.  Lymphadenopathy:     Cervical: No cervical adenopathy.  Skin:    General: Skin is warm and dry.     Capillary Refill: Capillary refill takes less than 2 seconds.  Neurological:      Mental Status: She is alert and oriented to person, place, and time.  Psychiatric:        Behavior: Behavior normal.         Musculoskeletal Exam: C-spine has limited and painful ROM.  Postural thoracic kyphosis.  Limited and painful mobility of the lumbar spine.  Midline spinal tenderness in the cervical and lumbar region.  Shoulder joints, elbow joints,wrist  joints, MCPs, PIPs, and DIPs good ROM with no synovitis.  Complete fist formation bilaterally.  Painful ROM of both hips.  Knee joints have good ROM with no warmth or effusion.  Ankle joints have good ROM with no tenderness or joint swelling.   CDAI Exam: CDAI Score: -- Patient Global: 40 / 100; Provider Global: 40 / 100 Swollen: --; Tender: -- Joint Exam 02/18/2023   No joint exam has been documented for this visit   There is currently no information documented on the homunculus. Go to the Rheumatology activity and complete the homunculus joint exam.  Investigation: No additional findings.  Imaging: No results found.  Recent Labs: Lab Results  Component Value Date   WBC 11.2 (H) 02/11/2023   HGB 14.5 02/11/2023   PLT 367 02/11/2023   NA 142 02/11/2023   K 4.1 02/11/2023   CL 105 02/11/2023   CO2 22 02/11/2023   GLUCOSE 83 02/11/2023   BUN 19 02/11/2023   CREATININE 0.79 02/11/2023   BILITOT 0.3 02/11/2023   ALKPHOS 85 02/11/2023   AST 23 02/11/2023   ALT 32 02/11/2023   PROT 6.7 02/11/2023   ALBUMIN 4.6 02/11/2023   CALCIUM 10.0 02/11/2023   GFRAA 98 03/24/2020   QFTBGOLDPLUS NEGATIVE 09/29/2021    Speciality Comments: PLQ Eye Exam: 01/24/2023 WNL @ [facility]     Procedures:  No procedures performed Allergies: Sulfa antibiotics, Sulfamethoxazole-trimethoprim, Atorvastatin, Codeine, Erythromycin, Metronidazole, Morphine and codeine, and Semaglutide   Assessment / Plan:     Visit Diagnoses: Rheumatoid arthritis of multiple sites with negative rheumatoid factor (HCC): She has no synovitis  on examination today.  She presents today with increased arthralgias and joint stiffness due to recent gaps in therapy.  Patient has been under the care of ENT for management of recurrent sinusitis.  She had Rasuvo for about 1 month while on several consecutive courses of antibiotics.  She completed the last course of antibiotics 8 days ago and took a dose of Rasuvo on 02/16/2023.  She has continued to take Plaquenil 200 mg 1 tablet by mouth twice daily Monday through Friday.  Patient has had ongoing congestion as well as postnasal drip.  She would likely require an MRI of the sinuses ordered by ENT for further evaluation.  Patient was encouraged to hold Rasuvo until cleared by ENT.  Okay to continue Plaquenil as monotherapy for now.  She was advised to notify us if she develops any new or worsening symptoms.  She will follow-up in the office in 5 months or sooner if needed.  High risk medication use - Rasuvo 20 mg sq injections once weekly, folic acid 2 mg daily, and Plaquenil 200 mg 1 tablet by mouth twice daily Monday through Friday. CBC and CMP updated on 02/11/23.  Her next lab work will be due in March and every 3 months.  PLQ Eye Exam: 01/24/2023 WNL @ [facility]  Discussed the importance of holding rasuvo if she develops signs or symptoms of an infection and to resume once the infection has completely cleared.   Recurrent sinusitis: She has been under the care of of ENT.  She has been experiencing recurrent sinusitis requiring 5 courses of antibiotics.  She was also tried several rounds of oral steroids.  She completed the last course of antibiotics 8 days ago and resumed Rasuvo on 02/16/2023 due to thinking her symptoms were improving.  Prior to that she had been holding Rasuvo for 4 weeks.   Patient  reports that ENT plans to proceed with an MRI for further evaluation.  Encourage patient to continue to hold Rasuvo until cleared by ENT.  Primary osteoarthritis of both hands: She has  PIP and DIP thickening consistent with osteoarthritis of both hands.  No active inflammation noted on examination today.  She continues to experience intermittent arthralgias and joint stiffness involving both hands.  Discussed the importance of joint protection and muscle strengthening.  Primary osteoarthritis of right knee: No warmth or effusion noted on examination.  Primary osteoarthritis of both feet: Patient continues to experience intermittent discomfort and stiffness in both feet.  Both ankle joints have good range of motion with no tenderness or synovitis.  Discussed the importance of wearing proper fitting shoes.  Other fatigue: Chronic and secondary to insomnia.  She has been experiencing interrupted sleep at night due to nocturnal pain.  Discussed the importance of good sleep hygiene.  DDD (degenerative disc disease), cervical - MRI of the cervical spine revealed mild cervical spondylosis.  Mild left foraminal narrowing at C3-C4 and C5-C6 noted.  Followed by Dr. Lovell Sheehan.  Degeneration of intervertebral disc of lumbar region without discogenic back pain or lower extremity pain - Under care of Dr. Lovell Sheehan.  Patient is scheduled to have a lower back injection in January 2025.  Trochanteric bursitis of right hip: Intermittent discomfort.  Chronic SI joint pain: Chronic pain  Fibromyalgia: Patient continues to experience intermittent myalgias and muscle tenderness due to fibromyalgia.  She has been experiencing increased myalgias, arthralgias, and fatigue over the past 1 month which she attributes to weather change as well as a gap in therapy.  She has been taking baclofen and tizanidine as needed for muscle spasms during flares.  She does not need any refills at this time. Discussed the importance of regular exercise and good sleep hygiene.  Rash and other nonspecific skin eruption: Dorsal aspect of right hand-mainly overlying base of the right thumb x1 week.  No identifiable trigger.  No  new products used.  Not been wearing any new gloves or a brace on her hand. Patient initially tried over-the-counter steroid cream with no improvement. Evaluated at urgent care on 02/14/23-prescribed betamethasone dipropionate ointment.  Reviewed encounter at which time the rash appeared to urticarial.   She has also been taking a Benadryl on a daily basis.  She has had excessive itching despite using a topical steroid as well as topical Benadryl.  She has not yet scheduled a follow-up visit with her PCP and does not have a dermatologist. Attached photos of the rash above.  No urticaria noted today.  Some of the lesions were open due to excessive itching/scratching. Patient plans on continuing to use the topical betamethasone ointment as well as will be trying a topical antifungal and will reach out to her PCP if the rash persists or worsens.  Other medical conditions are listed as follows:   Family history of rheumatoid arthritis-Sister  Family history of psoriatic arthritis-mother and sister  History of tics  History of diabetes mellitus  History of renal calculi  History of sleep apnea  History of thyroid nodule  History of gastroesophageal reflux (GERD)  History of depression  History of cellulitis  History of positive PPD  History of migraine  History of PCOS  Orders: No orders of the defined types were placed in this encounter.  No orders of the defined types were placed in this encounter.     Follow-Up Instructions: Return in about 5 months (around 07/19/2023) for  Rheumatoid arthritis, Osteoarthritis.   Gearldine Bienenstock, PA-C  Note - This record has been created using Dragon software.  Chart creation errors have been sought, but may not always  have been located. Such creation errors do not reflect on  the standard of medical care.

## 2023-02-04 NOTE — Patient Instructions (Signed)
Christina Lewis Med Nasal Saline Rinse   - start nasal saline rinses with [employer] available over the counter or online to help with nasal congestion

## 2023-02-04 NOTE — Progress Notes (Unsigned)
ENT Progress Note  Update 02/04/23  History of Present Illness   The patient, with a history of sinus issues, presents with recurrent symptoms despite previous treatment with antibiotics and steroids. They describe a pattern of improvement following treatment, only to relapse within a week or two. Symptoms include blocked ears, a sensation of struggling to breathe, and constant need to clear the throat, blow the nose, and snore. These symptoms have been disruptive to their sleep.   The patient had seen an ENT specialist a few weeks prior, who found no significant issues at the time. However, the patient's symptoms have since returned. A CT scan was performed, but the patient fell ill after the scan. The patient also mentions difficulty controlling their blood sugar levels and has an upcoming appointment with their endocrinologist.   The patient has been using Flonase for their nasal symptoms, having switched from another nasal spray due to excessive dryness. They have also been taking Xyzal, Sudafed, and Mucinex, and using saline solution to alleviate their symptoms.   The patient also reports a sensation of fluid in their right ear and a swollen lymph node. They have a history of elevated liver enzymes and white blood cell count. The patient is scheduled to see the ENT specialist again soon.         Update 01/14/23:   Discussed the use of AI scribe software for clinical note transcription with the patient, who gave verbal consent to proceed.  History of Present Illness   The patient, with a recent history of Inspire implant surgery, presents for follow-up. The patient reports mild discomfort at the site of submental incision at night. Despite these symptoms, the patient expresses satisfaction with the overall surgical outcome and how incisions appear currently.   In addition to post-surgical discomfort, the patient has been dealing with a persistent sinus issues/nasal congestion. They report  a recent severe sinus infection, which required multiple changes in antibiotic therapy, including two courses of Augmentin. The infection has since improved, but the patient continues to experience significant post-nasal drainage, particularly at night, to the point of choking. Prior to the infection, the patient had been experiencing chronic nasal congestion and a constant need to clear the nose, which had temporarily resolved with changes in medication but has since returned.  The patient is currently on a regimen of allergy medications, including nasal sprays. The patient has not been using saline rinses but has been using saline sprays.     Initial evaluation 09/22/22 Reason for Consult: OSA, nasal congestion and facial pain/pressure/post-nasal drainage/headaches/tinnitus    HPI: Christina Lewis is an 67 y.o. female with hx RA affecting small joints, f.b Rheum on Methotrexate and Plaquenil, hx of OSA, previously on CPAP for many years, however, discontinued CPAP use 2/2 being unable to tolerate the mask, here for multiple complaints.   1)  She is here for nasal congestion, nasal obstruction, and frontal headaches for years, feels nasal drainage and post-nasal drainage. She has decreased sense of smell, and she thinks it happened after covid 2021. Had allergy testing 4 years ago and it was negative. She had allergy shots for 4 yrs when she was a child. On Flonase and Zyrtec. She has hx of migraines, and feels they are under control after starting an infusion medication (new drug for migraines). She has frontal headache on the left when it acts up and with her sinus sx. 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 No new sx   3) hx of OSA and had three CPAP machines (used one for 1.5 yrs) and  diagnosed in 2002. She stopped using it due to intoleence and was recently re-established with sleep medicine. Recent sleep study done with [facility] neurology and she is interested in [place] implant.   3) Bilateral tinnitus, water in her ear, worse on the left, and she had ear infections in her ears and chronic sinus all her life, she  4) She reports hx of Esophageal dysmotility/GERD - on Prilosec.    Records Reviewed:  Being seen by Neurology for migraines - last visit with NP [place] 04/08/22 - migraines behind left eye with aura sx   TFTs have always normal has Endocrinologist.  Thyroid U/S 05/06/22  Estimated total number of nodules >/= 1 cm: 2   Number of spongiform nodules >/=  2 cm not described below (TR1): 0   Number of mixed cystic and solid nodules >/= 1.5 cm not described below (TR2): 0   _________________________________________________________   Nodule # 4: Previously biopsied nodule in the left mid gland is unchanged at 1.2 x 1.2 x 1.1 cm.   Nodule # 6: Previously biopsied nodule in the left inferior gland is unchanged at 1.8 x 1.6 x 1.2 cm.   Additional small subcentimeter thyroid nodules again noted scattered throughout the right gland. No interval change. No new nodules or suspicious features.   IMPRESSION: 1. Previously biopsied nodules in the left mid and left lower gland remain unchanged. Greater than 10 years of stability is consistent with benignity. 2. No new nodules or suspicious features.    Past Medical History:  Diagnosis Date   Allergy    Anemia    Anxiety    Arthritis    Depression    Diabetes mellitus    type 2   Fibromyalgia    GERD (gastroesophageal reflux disease)    Gout    no current problem   Hematuria    History of kidney stones    passed stones and also had surgery to remove one   History of tics    Ineffective esophageal motility    tx with prilosec, essophageal was stretched   Interstitial cystitis 2016   Migraines     Osteoarthritis    PCOS (polycystic ovarian syndrome) 1993   Pneumonia 2019   PONV (postoperative nausea and vomiting)    Sleep apnea    does not use CPAP   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, took medication for a year   Thyroid nodule    Dr Horald Pollen is monitoring every year    Past Surgical History:  Procedure Laterality Date   ABDOMINAL HYSTERECTOMY  2000   APPENDECTOMY  1976   BALLOON DILATION N/A 11/03/2021   Procedure: Marvis Repress DILATION;  Surgeon: Kerin Salen, MD;  Location: [facility];  Service: Gastroenterology;  Laterality: N/A;   BOTOX INJECTION N/A 11/03/2021   Procedure: BOTOX INJECTION;  Surgeon: Kerin Salen, MD;  Location: [facility];  Service: Gastroenterology;  Laterality: N/A;   BREAST LUMPECTOMY  1989   lt-negative   CARDIOVASCULAR STRESS TEST  2000   CATARACT EXTRACTION W/PHACO Left 12/07/2019   Procedure: CATARACT EXTRACTION PHACO AND INTRAOCULAR LENS PLACEMENT (IOC);  Surgeon: [name], MD;  Location: [facility];  Service: Ophthalmology;  Laterality: Left;  CDE: 5.55   CHOLECYSTECTOMY  1985   COLONOSCOPY     DRUG INDUCED ENDOSCOPY Bilateral 09/29/2022   Procedure: DRUG INDUCED ENDOSCOPY;  Surgeon: Ashok Croon, MD;  Location: [facility];  Service: ENT;  Laterality: Bilateral;  15 MINUTES   ESOPHAGEAL MANOMETRY N/A 08/24/2021   Procedure: ESOPHAGEAL MANOMETRY (EM);  Surgeon: Kerin Salen, MD;  Location: [facility];  Service: Gastroenterology;  Laterality: N/A;   ESOPHAGOGASTRODUODENOSCOPY N/A 11/03/2021   Procedure: ESOPHAGOGASTRODUODENOSCOPY (EGD);  Surgeon: Kerin Salen, MD;  Location: [facility];  Service: Gastroenterology;  Laterality: N/A;   HYSTERECTOMY ABDOMINAL WITH SALPINGECTOMY Bilateral 2000   IMPLANTATION OF HYPOGLOSSAL NERVE STIMULATOR Right 11/24/2022   Procedure: IMPLANTATION OF HYPOGLOSSAL NERVE STIMULATOR;  Surgeon: Ashok Croon, MD;  Location: [facility];  Service: ENT;  Laterality: Right;   KIDNEY STONE  SURGERY Right 2014   with stent placement in OR   KNEE ARTHROSCOPY Right 08/22/2013   Procedure: ARTHROSCOPY RIGHT KNEE FOR INFECTION LAVAGE AND DRAINAGE;  Surgeon: Thera Flake., MD;  Location: [facility];  Service: Orthopedics;  Laterality: Right;   KNEE ARTHROSCOPY WITH PATELLA RECONSTRUCTION Right 08/06/2013   Procedure: RIGHT KNEE ARTHROSCOPY WITH MENISCECTOMY MEDIAL, ARTHROSCOPY KNEE WITH DEBRIDEMENT/SHAVING (CHONDROPLASTY) ;  Surgeon: Thera Flake., MD;  Location: [facility];  Service: Orthopedics;  Laterality: Right;   LUMBAR LAMINECTOMY/DECOMPRESSION MICRODISCECTOMY Right 09/11/2019   Procedure: Right Lumbar Three-Four Microdiscectomy;  Surgeon: Maeola Harman, MD;  Location: [facility];  Service: Neurosurgery;  Laterality: Right;  Right Lumbar Three-Four Microdiscectomy   NASAL SINUS SURGERY     x4   OOPHORECTOMY     SHOULDER SURGERY  2011   right shoulder   THYROID SURGERY  1994   TONSILLECTOMY Bilateral 1974    Family History  Problem Relation Age of Onset   Fibromyalgia Mother    Psoriasis Mother        psoriatic arthritis    Diabetes Mother    Heart disease Mother    Psoriasis Sister        psoriatic arthritis    Diabetes Sister    Migraines Sister    Rheum arthritis Sister    Fibromyalgia Sister    Diabetes Sister     Social History:  reports that she has never smoked. She has been exposed to tobacco smoke. She has never used smokeless tobacco. She reports that she does not drink alcohol and does not use drugs.  Allergies:  Allergies  Allergen Reactions   Sulfa Antibiotics Anaphylaxis   Sulfamethoxazole-Trimethoprim Anaphylaxis   Atorvastatin Other (See Comments)    Severe Muscle and Joint pain with ALL statins    Codeine Itching   Erythromycin Nausea And Vomiting   Metronidazole Diarrhea   Morphine And Codeine Itching   Semaglutide Nausea And Vomiting    Ozempic*    Medications: I have reviewed the patient's current  medications.  The PMH, PSH, Medications, Allergies, and SH were reviewed and updated.  ROS: Constitutional: Negative for fever, weight loss and weight gain. Cardiovascular: Negative for chest pain and dyspnea on exertion. Respiratory: Is not experiencing shortness of breath at rest. Gastrointestinal: Negative for nausea and vomiting. Neurological: Negative for headaches. Psychiatric: The patient is not nervous/anxious  Blood pressure 123/67, pulse 77, SpO2 97%.  PHYSICAL EXAM:  Exam: General: Well-developed, well-nourished Respiratory Respiratory effort: Equal inspiration and expiration without stridor Cardiovascular Peripheral Vascular: Warm extremities with equal color/perfusion Eyes: No nystagmus with equal extraocular motion bilaterally Neuro/Psych/Balance: Patient oriented to person, place,  and time; Appropriate mood and affect; Gait is intact with no imbalance; Cranial nerves I-XII are intact Head and Face Inspection: Normocephalic and atraumatic without mass or lesion Palpation: Facial skeleton intact without bony stepoffs Salivary Glands: No mass or tenderness Facial Strength: Facial motility symmetric and full bilaterally ENT Pinna: External ear intact and fully developed External canal: Canal is patent with intact skin Tympanic Membrane: Clear and mobile External Nose: No scar or anatomic deformity Internal Nose: Septum is straight. No polyp, or purulence. Mucosal edema and erythema present.  Bilateral inferior turbinate hypertrophy.  Lips, Teeth, and gums: Mucosa and teeth intact and viable TMJ: No pain to palpation with full mobility Oral cavity/oropharynx: No erythema or exudate, no lesions present Nasopharynx: No mass or lesion with intact mucosa Neck Neck and Trachea: Midline trachea without mass or lesion Thyroid: No mass or nodularity Lymphatics: No lymphadenopathy Right submental and chest wall incision c/d/I completely healed at this point  Procedure:    PROCEDURE NOTE: nasal endoscopy  Preoperative diagnosis: chronic sinusitis symptoms  Postoperative diagnosis: same  Procedure: Diagnostic nasal endoscopy (95284)  Surgeon: Ashok Croon, M.D.  Anesthesia: Topical lidocaine and Afrin  H&P REVIEW: The patient's history and physical were reviewed today prior to procedure. All medications were reviewed and updated as well. Complications: None Condition is stable throughout exam Indications and consent: The patient presents with symptoms of chronic sinusitis not responding to previous therapies. All the risks, benefits, and potential complications were reviewed with the patient preoperatively and informed consent was obtained. The time out was completed with confirmation of the correct procedure.   Procedure: The patient was seated upright in the clinic. Topical lidocaine and Afrin were applied to the nasal cavity. After adequate anesthesia had occurred, the rigid nasal endoscope was passed into the nasal cavity. The nasal mucosa, turbinates, septum, and sinus drainage pathways were visualized bilaterally. This revealed prior sinus surgery and no purulence or significant secretions that might be cultured. There were no polyps or sites of significant inflammation. The mucosa was intact and there was no crusting present. The scope was then slowly withdrawn and the patient tolerated the procedure well. There were no complications or blood loss.      Studies Reviewed: Sleep Study 05/03/2022 done in-lab at [facility]  67 year old female with an underlying medical history of migraine headaches, allergies, arthritis, headache, depression, diabetes, fibromyalgia, reflux disease, gout, kidney stones, interstitial cystitis, PCOS, thyroid nodule, and overweight state, who was previously diagnosed with obstructive sleep apnea. She has not been on PAP therapy for several years 2/2 intolerance. Her original sleep apnea diagnosis was in the 90s and  per patient report she was severe at the time. Her Epworth sleepiness score is 5/24, fatigue severity score is 63 out of 63.  Height: 64 in Weight: 174 (BMI 29) Neck Size: 16 in   RESPIRATORY MONITORING:   Based on CMS criteria (using a 4% oxygen desaturation rule for scoring hypopneas), there were 39 apneas (39 obstructive; 0 central; 0 mixed), and 70 hypopneas.  Apnea index was 6.7. Hypopnea index was 12.1. The apnea-hypopnea index was 18.8/hour overall (n/s supine, 75 non-supine; 75.1/hour during REM sleep, 0.0 supine REM).  There were 0 respiratory effort-related arousals (RERAs).  The RERA index was 0 events/h. Total respiratory disturbance index (RDI) was 18.8 events/h. RDI results showed: supine RDI  0.0 /h; non-supine RDI 18.8 /h; REM RDI 75.1 /h, supine REM RDI 0.0 /h.   Based on AASM criteria (using a 3% oxygen desaturation and /or  arousal rule for scoring hypopneas), there were 39 apneas (39 obstructive; 0 central; 0 mixed), and 71 hypopneas. Apnea index was 6.7. Hypopnea index was 12.3. The apnea-hypopnea index was 19.0 overall (0.0 supine, 75 non-supine; 75.1 REM, 0.0 supine REM).  There were 0 respiratory effort-related arousals (RERAs).  The RERA index was 0 events/h. Total respiratory disturbance index (RDI) was 19.0 events/h. RDI results showed: supine RDI  0.0 /h; non-supine RDI 19.0 /h; REM RDI 75.1 /h, supine REM RDI 0.0 /h.    OXIMETRY: Oxyhemoglobin Saturation Nadir during sleep was at  72% from a mean of 94%.  Of the Total sleep time (TST)   hypoxemia (=<88%) was present for  14.9 minutes, or 4.3% of total sleep time.    LIMB MOVEMENTS: There were 0 periodic limb movements of sleep (0.0/hr), of which 0 (0.0/hr) were associated with an arousal.   AROUSAL: There were 45 arousals in total, for an arousal index of 8 arousals/hour.  Of these, 11 were identified as respiratory-related arousals (2 /h), 0 were PLM-related arousals (0 /h), and 50 were non-specific arousals (9 /h).   EEG:  Review of the EEG showed no abnormal electrical discharges and symmetrical bihemispheric findings.     EKG: The EKG revealed normal sinus rhythm (NSR). The average heart rate during sleep was 70 bpm.    AUDIO/VIDEO REVIEW: The audio and video review did not show any abnormal or unusual behaviors, movements, phonations or vocalizations. The patient took no restroom breaks. Snoring was noted throughout the night, ranging from milder to louder.   POST-STUDY QUESTIONNAIRE: Post study, the patient indicated, that sleep was worse than usual   IMPRESSION:  1. Obstructive Sleep Apnea (OSA) 2. Dysfunctions associated with sleep stages or arousal from sleep   RECOMMENDATIONS:  1. This study demonstrates moderate to severe obstructive sleep apnea, with a total AHI of 18.8/hour, and O2 nadir of 72% (during non-supine REM sleep).  Of note, the absence of supine sleep during the study likely underestimates her sleep disordered breathing.  Treatment with positive airway pressure in the form of CPAP is recommended. The patient will be advised to proceed with home AutoPap therapy for now.  A full night laboratory attended CPAP titration study can be considered to optimize therapy settings, mask fit, monitoring of tolerance and of proper oxygen saturations down the road. Other treatment options may be limited, and may include (generally speaking) surgical options in selected patients or the use of an oral appliance in certain patients. Concomitant weight loss is recommended (where clinically appropriate). Please note that untreated obstructive sleep apnea may carry additional perioperative morbidity. Patients with significant obstructive sleep apnea should receive perioperative PAP therapy and the surgeons and particularly the anesthesiologist should be informed of the diagnosis and the severity of the sleep disordered breathing. 2. This study shows sleep fragmentation and abnormal sleep stage percentages; these are  nonspecific findings and per se do not signify an intrinsic sleep disorder or a cause for the patient's sleep-related symptoms. Causes include (but are not limited to) the first night effect of the sleep study, circadian rhythm disturbances, medication effect or an underlying mood disorder or medical problem.  3. The patient should be cautioned not to drive, work at heights, or operate dangerous or heavy equipment when tired or sleepy. Review and reiteration of good sleep hygiene measures should be pursued with any patient. 4. The patient will be seen in follow-up in the sleep clinic at [facility] for discussion of the test results, symptom and  treatment compliance review, further management strategies, etc. The patient and her referring provider will be notified of the test results.        Assessment/Plan: No diagnosis found.    OSA dx many years ago and CPAP intolerance/non-compliance due to discomfort despite of trial of various masks, I reviewed her most recent study from 3/204 with [facility] and based on her recent BMI and sleep study results, she is a candidate for inspire therapy.  I discussed risks benefits and postoperative care for the procedure and she would like to proceed with the final stage of the workup for inspire implant.  Will work on scheduling her for drug-induced sleep endoscopy Nasal congestion chronic headaches concern for chronic sinusitis in the setting of allergies and history of sinus surgery in the past -nasal endoscopy today with evidence of nasal mucosal edema and prior FESS, no purulence or polyps, unable to fully visualize sinuses, will obtain sinus CT to evaluate Bilateral tinnitus described as ocean sounds, DDx age-related hearing loss versus eustachian tube dysfunction -will obtain audiogram Thyroid nodules -she will continue to follow-up with serial thyroid ultrasounds ordered by her PCP.  Prior workup including FNA not concerning for atypical cells and normal TFTs in the  past.   - schedule hearing test - start nasal saline rinses [employer] Med bottle)  - stop Zyrtec, start Clarinex, continue Flonase or if start steroid nasal rinses, ok to stop Flonase  - schedule CT sinuses  - work on scheduling of the sleep endoscopy ([facility]) - return after testing  Patient Instructions:  We will fax over an order for steroid carpule shipment to you directly to the compounding pharmacy in [place], [place]. They will call you to quote you the price and if acceptable, you can order it to add to the nasal saline rinses   NeilMed saline irrigation bottle can be found/purchased at the following website (or at any drug store):  https://shop.neilmed.com/products/sinus-rinse-starter-kit-[email or web address]-5-packets  Please search for YouTube videos to show you how to irrigate with saline (google sinus irrigation and filter for videos)  Update 01/14/23 S/p Inspire Implant 4 weeks ago. Doing well. Having persistent nasal congestion and was treated for a sinus infection with two rounds of Augmentin. Has persistent nasal congestion and PND.   Assessment and Plan    S/p Inspire Implant, doing well, has activation appointment coming up  Implant site looks good with incisions completely healed at this point. CN XII intact  - Proceed with activation appointment for Inspire - Follow up with sleep practice for settings adjustment and potential confirmatory sleep test in a few months  Recurrent Sinusitis vs exacerbation of chronic nasal congestion after URI Reports ongoing sinus issues with drainage causing choking at night. History of recent sinus infection treated with multiple antibiotics, Augmentin x 2. Recent CT sinus scan showed no chronic sinus disease. Current symptoms suggest residual congestion. Discussed that sometimes antibiotics are not needed and a steroid pack may help reduce inflammation quickly. Advised to use Neilmed sinus rinse to help with residual congestion. Nasal endoscopy without pus or  polyps but with evidence of mucosal edema.  - Prescribed Medrol Dosepak - Continue nasal sprays (Azelastine 2 puffs b/l nares BID), continue Clarinex 5 mg daily  - Recommend Neilmed sinus rinse daily  - will consider CT sinuses if sx will not improve     Ashok Croon, MD Otolaryngology [facility] Phone: [phone] Fax: [phone]    02/04/2023, 9:54 AM

## 2023-02-07 ENCOUNTER — Other Ambulatory Visit: Payer: Self-pay | Admitting: Rheumatology

## 2023-02-07 DIAGNOSIS — F6381 Intermittent explosive disorder: Secondary | ICD-10-CM | POA: Diagnosis not present

## 2023-02-07 DIAGNOSIS — F411 Generalized anxiety disorder: Secondary | ICD-10-CM | POA: Diagnosis not present

## 2023-02-07 DIAGNOSIS — F331 Major depressive disorder, recurrent, moderate: Secondary | ICD-10-CM | POA: Diagnosis not present

## 2023-02-07 NOTE — Telephone Encounter (Signed)
Last Fill: 01/10/2023  Next Visit: 02/18/2023  Last Visit: 11/17/2022  DX: Fibromyalgia  Current Dose per office note on 11/17/2022: not mentioned.   Okay to refill baclofen?

## 2023-02-11 ENCOUNTER — Encounter: Payer: Self-pay | Admitting: Family Medicine

## 2023-02-11 ENCOUNTER — Ambulatory Visit: Payer: Medicare PPO | Admitting: Family Medicine

## 2023-02-11 VITALS — BP 117/76 | HR 76 | Temp 97.6°F | Ht 64.0 in | Wt 162.6 lb

## 2023-02-11 DIAGNOSIS — F339 Major depressive disorder, recurrent, unspecified: Secondary | ICD-10-CM

## 2023-02-11 DIAGNOSIS — Z0000 Encounter for general adult medical examination without abnormal findings: Secondary | ICD-10-CM

## 2023-02-11 DIAGNOSIS — Z13820 Encounter for screening for osteoporosis: Secondary | ICD-10-CM

## 2023-02-11 DIAGNOSIS — E1169 Type 2 diabetes mellitus with other specified complication: Secondary | ICD-10-CM | POA: Diagnosis not present

## 2023-02-11 DIAGNOSIS — E669 Obesity, unspecified: Secondary | ICD-10-CM

## 2023-02-11 DIAGNOSIS — M0609 Rheumatoid arthritis without rheumatoid factor, multiple sites: Secondary | ICD-10-CM | POA: Diagnosis not present

## 2023-02-11 DIAGNOSIS — Z0001 Encounter for general adult medical examination with abnormal findings: Secondary | ICD-10-CM | POA: Diagnosis not present

## 2023-02-11 DIAGNOSIS — Z7984 Long term (current) use of oral hypoglycemic drugs: Secondary | ICD-10-CM

## 2023-02-11 DIAGNOSIS — E785 Hyperlipidemia, unspecified: Secondary | ICD-10-CM | POA: Diagnosis not present

## 2023-02-11 DIAGNOSIS — E559 Vitamin D deficiency, unspecified: Secondary | ICD-10-CM | POA: Diagnosis not present

## 2023-02-11 LAB — BAYER DCA HB A1C WAIVED: HB A1C (BAYER DCA - WAIVED): 5.9 % — ABNORMAL HIGH (ref 4.8–5.6)

## 2023-02-11 NOTE — Progress Notes (Signed)
Complete physical exam  Patient: Christina Lewis   DOB: 1955-11-15   67 y.o. Female  MRN: [id number]  Subjective:    Chief Complaint  Patient presents with   Annual Exam    Christina Lewis is a 67 y.o. female who presents today for a complete physical exam. She reports consuming a general diet. The patient does not participate in regular exercise at present. She generally feels well. She reports sleeping well. She does not have additional problems to discuss today.   Discussed the use of AI scribe software for clinical note transcription with the patient, who gave verbal consent to proceed.  History of Present Illness   The patient, with a history of diabetes, rheumatoid arthritis, and thyroid nodules, reports overall good health. She has been managing her diabetes with Jardiance, which she believes has led to some weight loss, but no excessive urination. Her blood sugar levels have been satisfactory, as confirmed by her endocrinologist during a recent visit. She sees her rheumatologist every three months, with no recent changes in her medication regimen.  The patient has been experiencing depression, which has been challenging to manage. Her psychiatrist recently increased her Zoloft dosage from 75mg  to 100mg . No side effects have been noted since the increase in Zoloft.  The patient has a history of osteopenia, and a DEXA scan is due to be scheduled. She has been taking Vitamin D supplements, as there is a family history of Vitamin D deficiency. Her thyroid nodules have not changed in size, and no further scans are planned unless there is a change in size or the patient or doctor can feel them. Her thyroid levels have been consistently normal.  The patient has been seeing a therapist and has an upcoming appointment with an eye surgeon due to a potential fluid buildup in the back of her eye. She has also experienced significant personal loss recently, with the deaths of several close family  members.       Most recent fall risk assessment:    02/11/2023   10:30 AM  Fall Risk   Falls in the past year? 0  Risk for fall due to : History of fall(s)  Follow up Falls evaluation completed     Most recent depression screenings:    02/11/2023   10:30 AM 09/01/2022   11:17 AM 03/09/2022    9:27 AM 02/09/2022    2:06 PM 10/15/2021   12:06 PM  Depression screen PHQ 2/9  Decreased Interest 2 3  3 3   Down, Depressed, Hopeless 2 3  3 3   PHQ - 2 Score 4 6  6 6   Altered sleeping 2 3  1 3   Tired, decreased energy 3 3  3 3   Change in appetite 2 2  0 3  Feeling bad or failure about yourself  1 1  0 1  Trouble concentrating 2 2  1 3   Moving slowly or fidgety/restless 0 0  0 0  Suicidal thoughts 0 0  0 0  PHQ-9 Score 14 17  11 19   Difficult doing work/chores Somewhat difficult Very difficult  Somewhat difficult Extremely dIfficult     Information is confidential and restricted. Go to Review Flowsheets to unlock data.      02/11/2023   10:31 AM 09/01/2022   11:17 AM 03/09/2022    9:31 AM 10/15/2021   12:06 PM  GAD 7 : Generalized Anxiety Score  Nervous, Anxious, on Edge 1 1  1   Control/stop worrying 1  2  0  Worry too much - different things 2 2  0  Trouble relaxing 1 1  0  Restless 0 0  0  Easily annoyed or irritable 1 3  3   Afraid - awful might happen 2 0  0  Total GAD 7 Score 8 9  4   Anxiety Difficulty Somewhat difficult Very difficult       Information is confidential and restricted. Go to Review Flowsheets to unlock data.      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   Vitamin D  deficiency 10/15/2021   Polycystic ovaries 10/15/2021   Migraine without aura and without status migrainosus, not intractable 10/15/2021   BMI 29.0-29.9,adult 10/15/2021   Rheumatoid arthritis (HCC) 03/04/2021   Type 2 diabetes mellitus with obesity (HCC) 03/04/2021   Right foot infection 03/04/2021   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 myelopathy 09/11/2019   Leukocytosis 11/04/2016   Fibromyalgia 02/10/2016   Other fatigue 02/10/2016   Primary osteoarthritis of both hands 02/10/2016   History of migraine 02/10/2016   History of thyroid nodule 02/10/2016   History of sleep apnea 02/10/2016   History of renal calculi 02/10/2016   Pain in joint, shoulder region 10/02/2010   Adhesive capsulitis of shoulder 10/02/2010   Muscle weakness (generalized) 10/02/2010   Past Medical History:  Diagnosis Date   Allergy    Anemia    Anxiety    Arthritis    Depression    Diabetes mellitus    type 2   Fibromyalgia    GERD (gastroesophageal reflux disease)    Gout    no current problem   Hematuria    History of kidney stones    passed stones and also had surgery to remove one   History of tics    Ineffective esophageal motility    tx with prilosec, essophageal was stretched   Interstitial cystitis 2016   Migraines    Osteoarthritis    PCOS (polycystic ovarian syndrome) 1993   Pneumonia 2019   PONV (postoperative nausea and vomiting)    Sleep apnea    does not use CPAP   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, took medication for a year   Thyroid nodule    Dr Horald Pollen is monitoring every year   Past Surgical History:  Procedure Laterality Date   ABDOMINAL HYSTERECTOMY  2000   APPENDECTOMY  1976   BALLOON DILATION N/A 11/03/2021   Procedure: Marvis Repress DILATION;  Surgeon: Kerin Salen, MD;  Location: [facility];  Service: Gastroenterology;  Laterality: N/A;   BOTOX INJECTION N/A 11/03/2021   Procedure: BOTOX INJECTION;  Surgeon: Kerin Salen, MD;  Location: [facility];   Service: Gastroenterology;  Laterality: N/A;   BREAST LUMPECTOMY  1989   lt-negative   CARDIOVASCULAR STRESS TEST  2000   CATARACT EXTRACTION W/PHACO Left 12/07/2019   Procedure: CATARACT EXTRACTION PHACO AND INTRAOCULAR LENS PLACEMENT (IOC);  Surgeon: [name], MD;  Location: [facility];  Service: Ophthalmology;  Laterality: Left;  CDE: 5.55   CHOLECYSTECTOMY  1985   COLONOSCOPY     DRUG INDUCED ENDOSCOPY Bilateral 09/29/2022   Procedure: DRUG INDUCED ENDOSCOPY;  Surgeon: Ashok Croon, MD;  Location: [facility];  Service: ENT;  Laterality: Bilateral;  15 MINUTES   ESOPHAGEAL MANOMETRY N/A 08/24/2021   Procedure: ESOPHAGEAL MANOMETRY (EM);  Surgeon: Kerin Salen, MD;  Location: [facility];  Service: Gastroenterology;  Laterality: N/A;   ESOPHAGOGASTRODUODENOSCOPY N/A 11/03/2021   Procedure: ESOPHAGOGASTRODUODENOSCOPY (EGD);  Surgeon: Kerin Salen, MD;  Location: [facility];  Service: Gastroenterology;  Laterality: N/A;   HYSTERECTOMY ABDOMINAL WITH SALPINGECTOMY Bilateral 2000   IMPLANTATION OF HYPOGLOSSAL NERVE STIMULATOR Right 11/24/2022   Procedure: IMPLANTATION OF HYPOGLOSSAL NERVE STIMULATOR;  Surgeon: Ashok Croon, MD;  Location: [facility];  Service: ENT;  Laterality: Right;   KIDNEY STONE SURGERY Right 2014   with stent placement in OR   KNEE ARTHROSCOPY Right 08/22/2013   Procedure: ARTHROSCOPY RIGHT KNEE FOR INFECTION LAVAGE AND DRAINAGE;  Surgeon: Thera Flake., MD;  Location: [facility];  Service: Orthopedics;  Laterality: Right;   KNEE ARTHROSCOPY WITH PATELLA RECONSTRUCTION Right 08/06/2013   Procedure: RIGHT KNEE ARTHROSCOPY WITH MENISCECTOMY MEDIAL, ARTHROSCOPY KNEE WITH DEBRIDEMENT/SHAVING (CHONDROPLASTY) ;  Surgeon: Thera Flake., MD;  Location: [facility];  Service: Orthopedics;  Laterality: Right;   LUMBAR LAMINECTOMY/DECOMPRESSION MICRODISCECTOMY Right 09/11/2019   Procedure: Right Lumbar Three-Four Microdiscectomy;  Surgeon:  Maeola Harman, MD;  Location: [facility];  Service: Neurosurgery;  Laterality: Right;  Right Lumbar Three-Four Microdiscectomy   NASAL SINUS SURGERY     x4   OOPHORECTOMY     SHOULDER SURGERY  2011   right shoulder   THYROID SURGERY  1994   TONSILLECTOMY Bilateral 1974   Social History   Tobacco Use   Smoking status: Never    Passive exposure: Past   Smokeless tobacco: Never  Vaping Use   Vaping status: Never Used  Substance Use Topics   Alcohol use: No   Drug use: Never   Social History   Socioeconomic History   Marital status: Widowed    Spouse name: Chrissie Noa   Number of children: Not on file   Years of education: Not on file   Highest education level: Bachelor's degree (e.g., BA, AB, BS)  Occupational History   Occupation: retired  Tobacco Use   Smoking status: Never    Passive exposure: Past   Smokeless tobacco: Never  Vaping Use   Vaping status: Never Used  Substance and Sexual Activity   Alcohol use: No   Drug use: Never   Sexual activity: Yes    Birth control/protection: Surgical    Comment: Hysterectomy  Other Topics Concern   Not on file  Social History Narrative   Caffiene 4-5 12 oz cans soda.   Working retired - special ed.   Live with home no kids   Social Drivers of Health   Financial Resource Strain: Low Risk  (12/13/2022)   Overall Financial Resource Strain (CARDIA)    Difficulty of Paying Living Expenses: Not very hard  Food Insecurity: No Food Insecurity (12/13/2022)   Hunger Vital Sign    Worried About Running Out of Food in the Last Year: Never true    Ran Out of Food in the Last Year: Never true  Transportation Needs: No Transportation Needs (12/13/2022)   PRAPARE - Administrator, Civil Service (Medical): No    Lack of Transportation (Non-Medical): No  Physical Activity: Unknown (12/13/2022)   Exercise Vital Sign    Days of Exercise per Week: 0 days    Minutes of Exercise per Session: Not on  file  Recent Concern: Physical  Activity - Inactive (12/13/2022)   Exercise Vital Sign    Days of Exercise per Week: 0 days    Minutes of Exercise per Session: 0 min  Stress: Stress Concern Present (12/13/2022)   [employer] of Occupational Health - Occupational Stress Questionnaire    Feeling of Stress : Rather much  Social Connections: Moderately Isolated (12/13/2022)   Social Connection and Isolation Panel [NHANES]    Frequency of Communication with Friends and Family: Twice a week    Frequency of Social Gatherings with Friends and Family: More than three times a week    Attends Religious Services: 1 to 4 times per year    Active Member of [employer] or Organizations: No    Attends Banker Meetings: Not on file    Marital Status: Widowed  Intimate Partner Violence: Not At Risk (02/09/2022)   Humiliation, Afraid, Rape, and Kick questionnaire    Fear of Current or Ex-Partner: No    Emotionally Abused: No    Physically Abused: No    Sexually Abused: No   Family Status  Relation Name Status   Mother  Deceased   Father  Deceased       sepsis    Sister  Alive   Sister  Alive   Brother  Alive  No partnership data on file   Family History  Problem Relation Age of Onset   Fibromyalgia Mother    Psoriasis Mother        psoriatic arthritis    Diabetes Mother    Heart disease Mother    Psoriasis Sister        psoriatic arthritis    Diabetes Sister    Migraines Sister    Rheum arthritis Sister    Fibromyalgia Sister    Diabetes Sister    Allergies  Allergen Reactions   Sulfa Antibiotics Anaphylaxis   Sulfamethoxazole-Trimethoprim Anaphylaxis   Atorvastatin Other (See Comments)    Severe Muscle and Joint pain with ALL statins    Codeine Itching   Erythromycin Nausea And Vomiting   Metronidazole Diarrhea   Morphine And Codeine Itching   Semaglutide Nausea And Vomiting    Ozempic*      Patient Care Team: Sonny Masters, FNP as PCP - General (Family Medicine) Smitty Cords, OD  (Optometry)   Outpatient Medications Prior to Visit  Medication Sig   acetaminophen (TYLENOL) 650 MG CR tablet Take 1,300 mg by mouth every 8 (eight) hours as needed for pain.   antiseptic oral rinse (BIOTENE) LIQD 15 mLs by Mouth Rinse route as needed for dry mouth.   aspirin EC 81 MG tablet Take 81 mg by mouth daily. Swallow whole.   baclofen (LIORESAL) 10 MG tablet TAKE 1 TABLET BY MOUTH EACH MORNING AND 1 TABLET AT NOON.   cholecalciferol (VITAMIN D) 25 MCG (1000 UNIT) tablet Take 1,000 Units by mouth daily.   Eptinezumab-jjmr (VYEPTI) 100 MG/ML injection Inject into the vein every 3 (three) months.   ferrous sulfate 325 (65 FE) MG tablet Take 325 mg by mouth daily.   folic acid (FOLVITE) 1 MG tablet Take 2 tablets (2 mg total) by mouth daily.   gabapentin (NEURONTIN) 300 MG capsule Take 300 mg by mouth 3 (three) times daily.   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   ibuprofen (ADVIL) 200 MG tablet Take 400 mg by mouth every 8 (eight) hours as needed for moderate  pain.   JARDIANCE 10 MG TABS tablet Take 10 mg by mouth daily.   MAGNESIUM PO Take 500 mg by mouth at bedtime.   Methotrexate, PF, (RASUVO) 20 MG/0.4ML SOAJ Inject 20 mg into the skin once a week.   Multiple Vitamin (MULTIVITAMIN WITH MINERALS) TABS tablet Take 1 tablet by mouth daily.   omeprazole (PRILOSEC) 20 MG capsule Take 40 mg by mouth daily.   ondansetron (ZOFRAN-ODT) 8 MG disintegrating tablet Take 8 mg by mouth every 8 (eight) hours as needed for nausea.    Polyvinyl Alcohol-Povidone (REFRESH OP) Apply to eye as needed.   Rimegepant Sulfate (NURTEC) 75 MG TBDP Take 1 tablet (75 mg total) by mouth daily as needed. For migraines. Take as close to onset of migraine as possible. One daily maximum.   sertraline (ZOLOFT) 100 MG tablet Take 1 tablet (100 mg total) by mouth daily.   tiZANidine (ZANAFLEX) 4 MG tablet TAKE ONE TABLET BY MOUTH AT  BEDTIME AS NEEDED   [DISCONTINUED] cefdinir (OMNICEF) 300 MG capsule Take 1 capsule (300 mg total) by mouth 2 (two) times daily. 1 po BID   [DISCONTINUED] desloratadine (CLARINEX) 5 MG tablet Take 1 tablet (5 mg total) by mouth daily.   [DISCONTINUED] TURMERIC-GINGER PO Take 1 tablet by mouth daily.   No facility-administered medications prior to visit.    Review of Systems  Constitutional:  Positive for weight loss. Negative for chills, diaphoresis, fever and malaise/fatigue.  Cardiovascular:  Negative for chest pain, palpitations, orthopnea, claudication, leg swelling and PND.  Neurological:  Negative for dizziness, sensory change, speech change, focal weakness, seizures, loss of consciousness, weakness and headaches.  Endo/Heme/Allergies:  Negative for environmental allergies and polydipsia. Does not bruise/bleed easily.  Psychiatric/Behavioral:  Positive for depression. Negative for hallucinations, memory loss, substance abuse and suicidal ideas. The patient is nervous/anxious and has insomnia.   All other systems reviewed and are negative.         Objective:     BP 117/76   Pulse 76   Temp 97.6 F (36.4 C)   Ht 5\' 4"  (1.626 m)   Wt 162 lb 9.6 oz (73.8 kg)   SpO2 95%   BMI 27.91 kg/m  BP Readings from Last 3 Encounters:  02/11/23 117/76  02/04/23 123/67  02/01/23 111/75   Wt Readings from Last 3 Encounters:  02/11/23 162 lb 9.6 oz (73.8 kg)  02/01/23 163 lb (73.9 kg)  12/15/22 159 lb (72.1 kg)   SpO2 Readings from Last 3 Encounters:  02/11/23 95%  02/04/23 97%  02/01/23 97%      Physical Exam Vitals and nursing note reviewed.  Constitutional:      General: She is not in acute distress.    Appearance: Normal appearance. She is well-developed, well-groomed and overweight. She is not ill-appearing, toxic-appearing or diaphoretic.  HENT:     Head: Normocephalic and atraumatic.     Jaw: There is normal jaw occlusion.     Right Ear: Hearing, tympanic membrane,  ear canal and external ear normal.     Left Ear: Hearing, tympanic membrane, ear canal and external ear normal.     Nose: Nose normal.     Mouth/Throat:     Lips: Pink.     Mouth: Mucous membranes are moist.     Pharynx: Oropharynx is clear. Uvula midline.  Eyes:     General: Lids are normal.     Extraocular Movements: Extraocular movements intact.     Conjunctiva/sclera: Conjunctivae normal.  Pupils: Pupils are equal, round, and reactive to light.  Neck:     Thyroid: No thyroid mass, thyromegaly or thyroid tenderness.     Vascular: No carotid bruit or JVD.     Trachea: Trachea and phonation normal.  Cardiovascular:     Rate and Rhythm: Normal rate and regular rhythm.     Chest Wall: PMI is not displaced.     Pulses: Normal pulses.          Dorsalis pedis pulses are 2+ on the right side and 2+ on the left side.       Posterior tibial pulses are 2+ on the right side and 2+ on the left side.     Heart sounds: Normal heart sounds. No murmur heard.    No friction rub. No gallop.  Pulmonary:     Effort: Pulmonary effort is normal. No respiratory distress.     Breath sounds: Normal breath sounds. No wheezing.  Abdominal:     General: Bowel sounds are normal. There is no distension or abdominal bruit.     Palpations: Abdomen is soft. There is no hepatomegaly or splenomegaly.     Tenderness: There is no abdominal tenderness. There is no right CVA tenderness or left CVA tenderness.     Hernia: No hernia is present.  Musculoskeletal:        General: Normal range of motion.     Cervical back: Normal range of motion and neck supple.     Right lower leg: No edema.     Left lower leg: No edema.     Right foot: Normal range of motion.     Left foot: Normal range of motion.  Feet:     Right foot:     Protective Sensation: 10 sites tested.  10 sites sensed.     Skin integrity: Skin integrity normal.     Toenail Condition: Right toenails are normal.     Left foot:     Protective  Sensation: 10 sites tested.  10 sites sensed.     Skin integrity: Skin integrity normal.     Toenail Condition: Left toenails are normal.  Lymphadenopathy:     Cervical: No cervical adenopathy.  Skin:    General: Skin is warm and dry.     Capillary Refill: Capillary refill takes less than 2 seconds.     Coloration: Skin is not cyanotic, jaundiced or pale.     Findings: No rash.  Neurological:     General: No focal deficit present.     Mental Status: She is alert and oriented to person, place, and time.     Sensory: Sensation is intact.     Motor: Tremor (resting) present.     Coordination: Coordination is intact.     Gait: Gait is intact.     Deep Tendon Reflexes: Reflexes are normal and symmetric.  Psychiatric:        Attention and Perception: Attention and perception normal.        Mood and Affect: Mood and affect normal.        Speech: Speech normal.        Behavior: Behavior normal. Behavior is cooperative.        Thought Content: Thought content normal.        Cognition and Memory: Cognition and memory normal.        Judgment: Judgment normal.      Last CBC Lab Results  Component Value Date   WBC 10.4 02/01/2023  HGB 14.0 02/01/2023   HCT 41.9 02/01/2023   MCV 94 02/01/2023   MCH 31.4 02/01/2023   RDW 14.0 02/01/2023   PLT 347 02/01/2023   Last metabolic panel Lab Results  Component Value Date   GLUCOSE 117 (H) 01/27/2023   NA 142 01/27/2023   K 4.5 01/27/2023   CL 106 01/27/2023   CO2 27 01/27/2023   BUN 12 01/27/2023   CREATININE 0.68 01/27/2023   EGFR 97 01/27/2023   CALCIUM 9.7 01/27/2023   PROT 6.6 01/27/2023   ALBUMIN 4.5 10/15/2021   LABGLOB 2.3 10/15/2021   AGRATIO 2.0 10/15/2021   BILITOT 0.3 01/27/2023   ALKPHOS 82 10/15/2021   AST 18 01/27/2023   ALT 22 01/27/2023   ANIONGAP 9 11/17/2022   Last lipids Lab Results  Component Value Date   CHOL 263 (H) 10/15/2021   HDL 51 10/15/2021   LDLCALC 153 (H) 10/15/2021   TRIG 317 (H)  10/15/2021   CHOLHDL 5.2 (H) 10/15/2021   Last hemoglobin A1c Lab Results  Component Value Date   HGBA1C 6.0 (H) 10/15/2021   Last thyroid functions Lab Results  Component Value Date   TSH 1.220 10/15/2021   T4TOTAL 4.1 (L) 10/15/2021   Last vitamin D Lab Results  Component Value Date   VD25OH 81.3 10/15/2021   Last vitamin B12 and Folate Lab Results  Component Value Date   VITAMINB12 816 02/01/2023   FOLATE >20.0 02/01/2023        Assessment & Plan:    Routine Health Maintenance and Physical Exam  Immunization History  Administered Date(s) Administered   Tdap 03/04/2021   Zoster Recombinant(Shingrix) 10/15/2021    Health Maintenance  Topic Date Due   Diabetic kidney evaluation - Urine ACR  Never done   Colonoscopy  Never done   HEMOGLOBIN A1C  04/17/2022   Medicare Annual Wellness (AWV)  02/10/2023   COVID-19 Vaccine (1) 02/27/2023 (Originally 02/03/1961)   Zoster Vaccines- Shingrix (2 of 2) 05/12/2023 (Originally 12/10/2021)   INFLUENZA VACCINE  05/23/2023 (Originally 09/23/2022)   Pneumonia Vaccine 8+ Years old (1 of 2 - PCV) 02/11/2024 (Originally 02[birth year]04/1963)   DEXA SCAN  02/11/2024 (Originally 02/03/2022)   OPHTHALMOLOGY EXAM  10/22/2023   Diabetic kidney evaluation - eGFR measurement  01/27/2024   FOOT EXAM  02/11/2024   MAMMOGRAM  07/06/2024   DTaP/Tdap/Td (2 - Td or Tdap) 03/05/2031   Hepatitis C Screening  Completed   HPV VACCINES  Aged Out    Discussed health benefits of physical activity, and encouraged her to engage in regular exercise appropriate for her age and condition.  Problem List Items Addressed This Visit       Endocrine   Type 2 diabetes mellitus with obesity (HCC)   Relevant Orders   Microalbumin / creatinine urine ratio   Bayer DCA Hb A1c Waived   CMP14+EGFR   CBC with Differential/Platelet   Lipid panel   Thyroid Panel With TSH   Hyperlipidemia associated with type 2 diabetes mellitus (HCC)   Relevant Orders   CMP14+EGFR    Lipid panel     Musculoskeletal and Integument   Rheumatoid arthritis (HCC)     Other   Vitamin D deficiency   Relevant Orders   CMP14+EGFR   VITAMIN D 25 Hydroxy (Vit-D Deficiency, Fractures)   Other Visit Diagnoses       Annual physical exam    -  Primary   Relevant Orders   Microalbumin / creatinine urine ratio   Bayer DCA  Hb A1c Waived   CMP14+EGFR   CBC with Differential/Platelet   Lipid panel   Thyroid Panel With TSH   VITAMIN D 25 Hydroxy (Vit-D Deficiency, Fractures)   DG Bone Density     Screening for osteoporosis       Relevant Orders   VITAMIN D 25 Hydroxy (Vit-D Deficiency, Fractures)   DG Bone Density     Depression, recurrent (HCC)         Assessment and Plan    Depression Difficulty managing depression. Recently saw psychiatrist who increased Zoloft from 75 mg to 100 mg. No side effects reported. - Continue Zoloft 100 mg - Reassess in four weeks, consider medication switch if no improvement  Type 2 Diabetes Mellitus Good control of blood sugars. Last A1c satisfactory. No polyuria. Sees endocrinologist annually unless sugars increase. - Order A1c test  Osteopenia Needs updated DEXA scan. Previous scan showed osteopenia. - Order DEXA scan  Thyroid Nodules Nodules have decreased in size. Thyroid levels normal. No further scans needed unless changes noted. - Monitor thyroid nodules  General Health Maintenance Up to date on mammogram, colonoscopy, and Pap smear. Needs second shingles vaccine. Regular eye and dental visits. Vitamin D levels normal, continues supplementation due to family history. Recent weight loss attributed to Jardiance, no increased urination. - Administer second shingles vaccine - Continue vitamin D supplementation - Order labs for routine check-up - Submit referrals for all physicians and therapist due to insurance change effective January 1  Follow-up - Request records from eye surgeon after January appointment.        Return in about 1 year (around 02/11/2024) for Annual Physical, 3 months DM.     Kari Baars, FNP

## 2023-02-12 LAB — CMP14+EGFR
ALT: 32 [IU]/L (ref 0–32)
AST: 23 [IU]/L (ref 0–40)
Albumin: 4.6 g/dL (ref 3.9–4.9)
Alkaline Phosphatase: 85 [IU]/L (ref 44–121)
BUN/Creatinine Ratio: 24 (ref 12–28)
BUN: 19 mg/dL (ref 8–27)
Bilirubin Total: 0.3 mg/dL (ref 0.0–1.2)
CO2: 22 mmol/L (ref 20–29)
Calcium: 10 mg/dL (ref 8.7–10.3)
Chloride: 105 mmol/L (ref 96–106)
Creatinine, Ser: 0.79 mg/dL (ref 0.57–1.00)
Globulin, Total: 2.1 g/dL (ref 1.5–4.5)
Glucose: 83 mg/dL (ref 70–99)
Potassium: 4.1 mmol/L (ref 3.5–5.2)
Sodium: 142 mmol/L (ref 134–144)
Total Protein: 6.7 g/dL (ref 6.0–8.5)
eGFR: 82 mL/min/{1.73_m2} (ref >59)

## 2023-02-12 LAB — CBC WITH DIFFERENTIAL/PLATELET
Basophils Absolute: 0.1 10*3/uL (ref 0.0–0.2)
Basos: 1 %
EOS (ABSOLUTE): 0.5 10*3/uL — ABNORMAL HIGH (ref 0.0–0.4)
Eos: 5 %
Hematocrit: 45.1 % (ref 34.0–46.6)
Hemoglobin: 14.5 g/dL (ref 11.1–15.9)
Immature Grans (Abs): 0.3 10*3/uL — ABNORMAL HIGH (ref 0.0–0.1)
Immature Granulocytes: 3 %
Lymphocytes Absolute: 3 10*3/uL (ref 0.7–3.1)
Lymphs: 27 %
MCH: 30.7 pg (ref 26.6–33.0)
MCHC: 32.2 g/dL (ref 31.5–35.7)
MCV: 95 fL (ref 79–97)
Monocytes Absolute: 0.7 10*3/uL (ref 0.1–0.9)
Monocytes: 6 %
Neutrophils Absolute: 6.6 10*3/uL (ref 1.4–7.0)
Neutrophils: 58 %
Platelets: 367 10*3/uL (ref 150–450)
RBC: 4.73 x10E6/uL (ref 3.77–5.28)
RDW: 13.8 % (ref 11.7–15.4)
WBC: 11.2 10*3/uL — ABNORMAL HIGH (ref 3.4–10.8)

## 2023-02-12 LAB — THYROID PANEL WITH TSH
Free Thyroxine Index: 1.2 (ref 1.2–4.9)
T3 Uptake Ratio: 25 % (ref 24–39)
T4, Total: 4.6 ug/dL (ref 4.5–12.0)
TSH: 0.762 u[IU]/mL (ref 0.450–4.500)

## 2023-02-12 LAB — LIPID PANEL
Chol/HDL Ratio: 6.4 {ratio} — ABNORMAL HIGH (ref 0.0–4.4)
Cholesterol, Total: 319 mg/dL — ABNORMAL HIGH (ref 100–199)
HDL: 50 mg/dL (ref >39)
LDL Chol Calc (NIH): 182 mg/dL — ABNORMAL HIGH (ref 0–99)
Triglycerides: 432 mg/dL — ABNORMAL HIGH (ref 0–149)
VLDL Cholesterol Cal: 87 mg/dL — ABNORMAL HIGH (ref 5–40)

## 2023-02-12 LAB — VITAMIN D 25 HYDROXY (VIT D DEFICIENCY, FRACTURES): Vit D, 25-Hydroxy: 59.2 ng/mL (ref 30.0–100.0)

## 2023-02-13 LAB — MICROALBUMIN / CREATININE URINE RATIO
Creatinine, Urine: 75.4 mg/dL
Microalb/Creat Ratio: 11 mg/g{creat} (ref 0–29)
Microalbumin, Urine: 8.6 ug/mL

## 2023-02-14 ENCOUNTER — Ambulatory Visit
Admission: RE | Admit: 2023-02-14 | Discharge: 2023-02-14 | Disposition: A | Discharge status: Home / Self Care | Payer: Medicare PPO | Source: Ambulatory Visit | Attending: Nurse Practitioner | Admitting: Nurse Practitioner

## 2023-02-14 VITALS — BP 113/73 | HR 77 | Temp 98.3°F | Resp 18

## 2023-02-14 DIAGNOSIS — R21 Rash and other nonspecific skin eruption: Secondary | ICD-10-CM | POA: Diagnosis not present

## 2023-02-14 MED ORDER — BETAMETHASONE DIPROPIONATE 0.05 % EX OINT: TOPICAL_OINTMENT | Freq: Two times a day (BID) | CUTANEOUS | 0 refills | Status: DC

## 2023-02-14 NOTE — Discharge Instructions (Signed)
Clean the rash twice daily with mild soap and water.  Then, apply a thin layer of the ointment that I sent to the pharmacy to have with itching.  After a couple of minutes, apply another layer of Aquaphor or Vaseline or vitamin A&E ointment to coat the area.  Start taking oral cetirizine, loratadine, or fexofenadine twice daily to help with the itching.  Seek care if symptoms do not improve with treatment.

## 2023-02-14 NOTE — ED Triage Notes (Signed)
Pt reports she has a rash on her right hand x 3 days.

## 2023-02-14 NOTE — ED Provider Notes (Signed)
[facility]    CSN: [id number] Arrival date & time: 02/14/23  1743      History   Chief Complaint Chief Complaint  Patient presents with   Rash    Not a true rash, but a v shaped spot on my right has been itching since Friday. It is keeping me up at night. Corticosteroids creams, and Benadryl are not helping. This morning my thumb was numb and swollen, but the swelling had gone down. - Entered by patient    HPI Christina Lewis is a 67 y.o. female.   Patient presents today with rash to right hand that she noticed 3 days ago.  Reports it was very itchy at first and she itches so much that she scratched sores in her hand.  She has tried multiple topical ointments without improvement in itching.  Has also tried taking Benadryl without improvement.  Reports area is red, but not increasing in size or spreading.  No recent change in any detergents, soaps, personal care products.  Denies shortness of breath or throat/tongue swelling.    Past Medical History:  Diagnosis Date   Allergy    Anemia    Anxiety    Arthritis    Depression    Diabetes mellitus    type 2   Fibromyalgia    GERD (gastroesophageal reflux disease)    Gout    no current problem   Hematuria    History of kidney stones    passed stones and also had surgery to remove one   History of tics    Ineffective esophageal motility    tx with prilosec, essophageal was stretched   Interstitial cystitis 2016   Migraines    Osteoarthritis    PCOS (polycystic ovarian syndrome) 1993   Pneumonia 2019   PONV (postoperative nausea and vomiting)    Sleep apnea    does not use CPAP   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, took medication for a year   Thyroid nodule    Dr Horald Pollen is monitoring every year    Patient Active Problem List   Diagnosis Date Noted   Chronic migraine without aura without status migrainosus, not intractable 04/26/2022   Dysphagia 10/15/2021   Hyperlipidemia  associated with type 2 diabetes mellitus (HCC) 10/15/2021   Hyperglycemia due to type 2 diabetes mellitus (HCC) 10/15/2021   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   Vitamin D deficiency 10/15/2021   Polycystic ovaries 10/15/2021   Migraine without aura and without status migrainosus, not intractable 10/15/2021   BMI 29.0-29.9,adult 10/15/2021   Rheumatoid arthritis (HCC) 03/04/2021   Type 2 diabetes mellitus with obesity (HCC) 03/04/2021   Right foot infection 03/04/2021   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 myelopathy 09/11/2019   Leukocytosis 11/04/2016   Fibromyalgia 02/10/2016   Other fatigue 02/10/2016   Primary osteoarthritis of both hands 02/10/2016   History of migraine 02/10/2016   History of thyroid nodule 02/10/2016   History of sleep apnea 02/10/2016   History of renal calculi 02/10/2016   Pain in joint, shoulder region 10/02/2010   Adhesive capsulitis of shoulder 10/02/2010   Muscle weakness (generalized) 10/02/2010    Past Surgical History:  Procedure Laterality Date   ABDOMINAL HYSTERECTOMY  2000   APPENDECTOMY  1976   BALLOON DILATION N/A 11/03/2021   Procedure: Marvis Repress DILATION;  Surgeon: Kerin Salen, MD;  Location: [facility];  Service: Gastroenterology;  Laterality: N/A;   BOTOX INJECTION N/A 11/03/2021   Procedure: BOTOX INJECTION;  Surgeon:  Kerin Salen, MD;  Location: [facility];  Service: Gastroenterology;  Laterality: N/A;   BREAST LUMPECTOMY  1989   lt-negative   CARDIOVASCULAR STRESS TEST  2000   CATARACT EXTRACTION W/PHACO Left 12/07/2019   Procedure: CATARACT EXTRACTION PHACO AND INTRAOCULAR LENS PLACEMENT (IOC);  Surgeon: [name], MD;  Location: [facility];  Service: Ophthalmology;  Laterality: Left;  CDE: 5.55   CHOLECYSTECTOMY  1985   COLONOSCOPY     DRUG INDUCED ENDOSCOPY Bilateral 09/29/2022   Procedure: DRUG INDUCED ENDOSCOPY;  Surgeon: Ashok Croon, MD;  Location: [facility];  Service: ENT;  Laterality: Bilateral;  15 MINUTES    ESOPHAGEAL MANOMETRY N/A 08/24/2021   Procedure: ESOPHAGEAL MANOMETRY (EM);  Surgeon: Kerin Salen, MD;  Location: [facility];  Service: Gastroenterology;  Laterality: N/A;   ESOPHAGOGASTRODUODENOSCOPY N/A 11/03/2021   Procedure: ESOPHAGOGASTRODUODENOSCOPY (EGD);  Surgeon: Kerin Salen, MD;  Location: [facility];  Service: Gastroenterology;  Laterality: N/A;   HYSTERECTOMY ABDOMINAL WITH SALPINGECTOMY Bilateral 2000   IMPLANTATION OF HYPOGLOSSAL NERVE STIMULATOR Right 11/24/2022   Procedure: IMPLANTATION OF HYPOGLOSSAL NERVE STIMULATOR;  Surgeon: Ashok Croon, MD;  Location: [facility];  Service: ENT;  Laterality: Right;   KIDNEY STONE SURGERY Right 2014   with stent placement in OR   KNEE ARTHROSCOPY Right 08/22/2013   Procedure: ARTHROSCOPY RIGHT KNEE FOR INFECTION LAVAGE AND DRAINAGE;  Surgeon: Thera Flake., MD;  Location: [facility];  Service: Orthopedics;  Laterality: Right;   KNEE ARTHROSCOPY WITH PATELLA RECONSTRUCTION Right 08/06/2013   Procedure: RIGHT KNEE ARTHROSCOPY WITH MENISCECTOMY MEDIAL, ARTHROSCOPY KNEE WITH DEBRIDEMENT/SHAVING (CHONDROPLASTY) ;  Surgeon: Thera Flake., MD;  Location: [facility];  Service: Orthopedics;  Laterality: Right;   LUMBAR LAMINECTOMY/DECOMPRESSION MICRODISCECTOMY Right 09/11/2019   Procedure: Right Lumbar Three-Four Microdiscectomy;  Surgeon: Maeola Harman, MD;  Location: [facility];  Service: Neurosurgery;  Laterality: Right;  Right Lumbar Three-Four Microdiscectomy   NASAL SINUS SURGERY     x4   OOPHORECTOMY     SHOULDER SURGERY  2011   right shoulder   THYROID SURGERY  1994   TONSILLECTOMY Bilateral 1974    OB History   No obstetric history on file.      Home Medications    Prior to Admission medications   Medication Sig Start Date End Date Taking? Authorizing Provider  betamethasone dipropionate (DIPROLENE) 0.05 % ointment Apply topically 2 (two) times daily. 02/14/23  Yes Valentino Nose, NP  acetaminophen  (TYLENOL) 650 MG CR tablet Take 1,300 mg by mouth every 8 (eight) hours as needed for pain.    [provider]  antiseptic oral rinse (BIOTENE) LIQD 15 mLs by Mouth Rinse route as needed for dry mouth.    [provider]  aspirin EC 81 MG tablet Take 81 mg by mouth daily. Swallow whole.    [provider]  baclofen (LIORESAL) 10 MG tablet TAKE 1 TABLET BY MOUTH EACH MORNING AND 1 TABLET AT NOON. 02/07/23   Gearldine Bienenstock, PA-C  cholecalciferol (VITAMIN D) 25 MCG (1000 UNIT) tablet Take 1,000 Units by mouth daily.    [provider]  Eptinezumab-jjmr (VYEPTI) 100 MG/ML injection Inject into the vein every 3 (three) months. 01/26/22   [provider]  ferrous sulfate 325 (65 FE) MG tablet Take 325 mg by mouth daily.    [provider]  folic acid (FOLVITE) 1 MG tablet Take 2 tablets (2 mg total) by mouth daily. 06/04/22   Gearldine Bienenstock, PA-C  gabapentin (NEURONTIN) 300 MG capsule Take 300 mg by mouth 3 (three) times daily.    [provider]  hydroxychloroquine (PLAQUENIL) 200 MG tablet TAKE 1 TABLET TWICE DAILY MONDAY THRU FRIDAY FOR RHEUMATOID ARTHRTITIS. 12/27/22   Gearldine Bienenstock, PA-C  ibuprofen (ADVIL) 200 MG tablet Take 400 mg by mouth every 8 (eight) hours as needed for moderate pain.    [provider]  JARDIANCE 10 MG TABS tablet Take 10 mg by mouth daily.    Dorisann Frames, MD  MAGNESIUM PO Take 500 mg by mouth at bedtime.    [provider]  Methotrexate, PF, (RASUVO) 20 MG/0.4ML SOAJ Inject 20 mg into the skin once a week. 12/07/22   Pollyann Savoy, MD  Multiple Vitamin (MULTIVITAMIN WITH MINERALS) TABS tablet Take 1 tablet by mouth daily.    [provider]  omeprazole (PRILOSEC) 20 MG capsule Take 40 mg by mouth daily. 09/23/21   [provider]  ondansetron (ZOFRAN-ODT) 8 MG disintegrating tablet Take 8 mg by mouth every 8 (eight) hours as needed for nausea.     [provider]   Polyvinyl Alcohol-Povidone (REFRESH OP) Apply to eye as needed.    [provider]  Rimegepant Sulfate (NURTEC) 75 MG TBDP Take 1 tablet (75 mg total) by mouth daily as needed. For migraines. Take as close to onset of migraine as possible. One daily maximum. 04/26/22   Anson Fret, MD  sertraline (ZOLOFT) 100 MG tablet Take 1 tablet (100 mg total) by mouth daily. 01/24/23   Arfeen, Phillips Grout, MD  tiZANidine (ZANAFLEX) 4 MG tablet TAKE ONE TABLET BY MOUTH AT BEDTIME AS NEEDED 01/04/23   Gearldine Bienenstock, PA-C    Family History Family History  Problem Relation Age of Onset   Fibromyalgia Mother    Psoriasis Mother        psoriatic arthritis    Diabetes Mother    Heart disease Mother    Psoriasis Sister        psoriatic arthritis    Diabetes Sister    Migraines Sister    Rheum arthritis Sister    Fibromyalgia Sister    Diabetes Sister     Social History Social History   Tobacco Use   Smoking status: Never    Passive exposure: Past   Smokeless tobacco: Never  Vaping Use   Vaping status: Never Used  Substance Use Topics   Alcohol use: No   Drug use: Never     Allergies   Sulfa antibiotics, Sulfamethoxazole-trimethoprim, Atorvastatin, Codeine, Erythromycin, Metronidazole, Morphine and codeine, and Semaglutide   Review of Systems Review of Systems Per HPI  Physical Exam Triage Vital Signs ED Triage Vitals  Encounter Vitals Group     BP 02/14/23 1807 113/73     Systolic BP Percentile --      Diastolic BP Percentile --      Pulse Rate 02/14/23 1807 77     Resp 02/14/23 1807 18     Temp 02/14/23 1807 98.3 F (36.8 C)     Temp Source 02/14/23 1807 Oral     SpO2 02/14/23 1807 94 %     Weight --      Height --      Head Circumference --      Peak Flow --      Pain Score 02/14/23 1808 0     Pain Loc --      Pain Education --      Exclude from [employer]  Chart --    No data found.  Updated Vital Signs BP 113/73 (BP Location: Right Arm)   Pulse 77   Temp  98.3 F (36.8 C) (Oral)   Resp 18   SpO2 94%   Visual Acuity Right Eye Distance:   Left Eye Distance:   Bilateral Distance:    Right Eye Near:   Left Eye Near:    Bilateral Near:     Physical Exam Vitals and nursing note reviewed.  Constitutional:      General: She is not in acute distress.    Appearance: Normal appearance. She is not toxic-appearing.  HENT:     Mouth/Throat:     Mouth: Mucous membranes are moist.     Pharynx: Oropharynx is clear.  Pulmonary:     Effort: Pulmonary effort is normal. No respiratory distress.  Skin:    General: Skin is warm and dry.     Capillary Refill: Capillary refill takes less than 2 seconds.     Findings: Erythema and rash present.     Comments: Erythematous, raised, urticarial rash noted to right dorsal hand near the base of the thumb.  No surrounding erythema, active drainage, warmth.  Neurological:     Mental Status: She is alert and oriented to person, place, and time.  Psychiatric:        Behavior: Behavior is cooperative.      UC Treatments / Results  Labs (all labs ordered are listed, but only abnormal results are displayed) Labs Reviewed - No data to display  EKG   Radiology No results found.  Procedures Procedures (including critical care time)  Medications Ordered in UC Medications - No data to display  Initial Impression / Assessment and Plan / UC Course  I have reviewed the triage vital signs and the nursing notes.  Pertinent labs & imaging results that were available during my care of the patient were reviewed by me and considered in my medical decision making (see chart for details).   Patient is well-appearing, normotensive, afebrile, not tachycardic, not tachypneic, oxygenating well on room air.    1. Rash and nonspecific skin eruption Treat with good skin hygiene, start topical steroid ointment, oral antihistamine twice daily Return for persistent/worsening symptoms despite treatment  The patient  was given the opportunity to ask questions.  All questions answered to their satisfaction.  The patient is in agreement to this plan.    Final Clinical Impressions(s) / UC Diagnoses   Final diagnoses:  Rash and nonspecific skin eruption     Discharge Instructions      Clean the rash twice daily with mild soap and water.  Then, apply a thin layer of the ointment that I sent to the pharmacy to have with itching.  After a couple of minutes, apply another layer of Aquaphor or Vaseline or vitamin A&E ointment to coat the area.  Start taking oral cetirizine, loratadine, or fexofenadine twice daily to help with the itching.  Seek care if symptoms do not improve with treatment.    ED Prescriptions     Medication Sig Dispense Auth. Provider   betamethasone dipropionate (DIPROLENE) 0.05 % ointment Apply topically 2 (two) times daily. 15 g Valentino Nose, NP      PDMP not reviewed this encounter.   Valentino Nose, NP 02/14/23 [phone]

## 2023-02-17 ENCOUNTER — Ambulatory Visit: Payer: Medicare PPO | Admitting: Physician Assistant

## 2023-02-18 ENCOUNTER — Encounter: Payer: Self-pay | Admitting: Physician Assistant

## 2023-02-18 ENCOUNTER — Ambulatory Visit (INDEPENDENT_AMBULATORY_CARE_PROVIDER_SITE_OTHER): Payer: Medicare PPO

## 2023-02-18 ENCOUNTER — Encounter: Payer: Self-pay | Admitting: Family Medicine

## 2023-02-18 ENCOUNTER — Ambulatory Visit: Payer: Medicare PPO | Attending: Physician Assistant | Admitting: Physician Assistant

## 2023-02-18 ENCOUNTER — Telehealth (INDEPENDENT_AMBULATORY_CARE_PROVIDER_SITE_OTHER): Payer: Medicare PPO | Admitting: Family Medicine

## 2023-02-18 VITALS — Ht 64.0 in | Wt 162.0 lb

## 2023-02-18 VITALS — BP 121/74 | HR 81 | Resp 16 | Ht 64.0 in | Wt 164.0 lb

## 2023-02-18 DIAGNOSIS — J329 Chronic sinusitis, unspecified: Secondary | ICD-10-CM

## 2023-02-18 DIAGNOSIS — Z840 Family history of diseases of the skin and subcutaneous tissue: Secondary | ICD-10-CM

## 2023-02-18 DIAGNOSIS — R21 Rash and other nonspecific skin eruption: Secondary | ICD-10-CM

## 2023-02-18 DIAGNOSIS — M0609 Rheumatoid arthritis without rheumatoid factor, multiple sites: Secondary | ICD-10-CM | POA: Diagnosis not present

## 2023-02-18 DIAGNOSIS — M533 Sacrococcygeal disorders, not elsewhere classified: Secondary | ICD-10-CM

## 2023-02-18 DIAGNOSIS — R5383 Other fatigue: Secondary | ICD-10-CM

## 2023-02-18 DIAGNOSIS — M19042 Primary osteoarthritis, left hand: Secondary | ICD-10-CM

## 2023-02-18 DIAGNOSIS — Z872 Personal history of diseases of the skin and subcutaneous tissue: Secondary | ICD-10-CM

## 2023-02-18 DIAGNOSIS — M5030 Other cervical disc degeneration, unspecified cervical region: Secondary | ICD-10-CM

## 2023-02-18 DIAGNOSIS — Z8669 Personal history of other diseases of the nervous system and sense organs: Secondary | ICD-10-CM

## 2023-02-18 DIAGNOSIS — Z8719 Personal history of other diseases of the digestive system: Secondary | ICD-10-CM

## 2023-02-18 DIAGNOSIS — M19071 Primary osteoarthritis, right ankle and foot: Secondary | ICD-10-CM

## 2023-02-18 DIAGNOSIS — M51369 Other intervertebral disc degeneration, lumbar region without mention of lumbar back pain or lower extremity pain: Secondary | ICD-10-CM | POA: Diagnosis not present

## 2023-02-18 DIAGNOSIS — M1711 Unilateral primary osteoarthritis, right knee: Secondary | ICD-10-CM | POA: Diagnosis not present

## 2023-02-18 DIAGNOSIS — M7061 Trochanteric bursitis, right hip: Secondary | ICD-10-CM | POA: Diagnosis not present

## 2023-02-18 DIAGNOSIS — Z9289 Personal history of other medical treatment: Secondary | ICD-10-CM

## 2023-02-18 DIAGNOSIS — M19041 Primary osteoarthritis, right hand: Secondary | ICD-10-CM | POA: Diagnosis not present

## 2023-02-18 DIAGNOSIS — G8929 Other chronic pain: Secondary | ICD-10-CM

## 2023-02-18 DIAGNOSIS — Z8639 Personal history of other endocrine, nutritional and metabolic disease: Secondary | ICD-10-CM

## 2023-02-18 DIAGNOSIS — Z87442 Personal history of urinary calculi: Secondary | ICD-10-CM

## 2023-02-18 DIAGNOSIS — Z79899 Other long term (current) drug therapy: Secondary | ICD-10-CM | POA: Diagnosis not present

## 2023-02-18 DIAGNOSIS — Z0000 Encounter for general adult medical examination without abnormal findings: Secondary | ICD-10-CM | POA: Diagnosis not present

## 2023-02-18 DIAGNOSIS — M797 Fibromyalgia: Secondary | ICD-10-CM

## 2023-02-18 DIAGNOSIS — Z8742 Personal history of other diseases of the female genital tract: Secondary | ICD-10-CM

## 2023-02-18 DIAGNOSIS — Z8659 Personal history of other mental and behavioral disorders: Secondary | ICD-10-CM

## 2023-02-18 DIAGNOSIS — M19072 Primary osteoarthritis, left ankle and foot: Secondary | ICD-10-CM

## 2023-02-18 DIAGNOSIS — Z8261 Family history of arthritis: Secondary | ICD-10-CM

## 2023-02-18 MED ORDER — DOXYCYCLINE HYCLATE 100 MG PO TABS
100.0000 mg | ORAL_TABLET | Freq: Two times a day (BID) | ORAL | 0 refills | Status: AC
Start: 2023-02-18 — End: 2023-02-25

## 2023-02-18 NOTE — Patient Instructions (Signed)

## 2023-02-18 NOTE — Patient Instructions (Signed)
Christina Lewis , Thank you for taking time to come for your Medicare Wellness Visit. I appreciate your ongoing commitment to your health goals. Please review the following plan we discussed and let me know if I can assist you in the future.   Referrals/Orders/Follow-Ups/Clinician Recommendations: Aim for 30 minutes of exercise or brisk walking, 6-8 glasses of water, and 5 servings of fruits and vegetables each day.   This is a list of the screening recommended for you and due dates:  Health Maintenance  Topic Date Due   Colon Cancer Screening  Never done   COVID-19 Vaccine (1) 02/27/2023*   Zoster (Shingles) Vaccine (2 of 2) 05/12/2023*   Flu Shot  05/23/2023*   Pneumonia Vaccine (1 of 2 - PCV) 02/11/2024*   DEXA scan (bone density measurement)  02/11/2024*   Hemoglobin A1C  08/12/2023   Eye exam for diabetics  10/22/2023   Yearly kidney function blood test for diabetes  02/11/2024   Yearly kidney health urinalysis for diabetes  02/11/2024   Complete foot exam   02/11/2024   Medicare Annual Wellness Visit  02/18/2024   Mammogram  07/06/2024   DTaP/Tdap/Td vaccine (2 - Td or Tdap) 03/05/2031   Hepatitis C Screening  Completed   HPV Vaccine  Aged Out  *Topic was postponed. The date shown is not the original due date.    Advanced directives: (ACP Link)Information on Advanced Care Planning can be found at [facility] Advance Health Care Directives Advance Health Care Directives ([email or web address]   Next Medicare Annual Wellness Visit scheduled for next year: Yes

## 2023-02-18 NOTE — Progress Notes (Signed)
Virtual Visit via Video Note  I connected with Christina Lewis on 02/18/23 at  5:00 PM EST by a video enabled telemedicine application and verified that I am speaking with the correct person using two identifiers.  Patient Location: Other:  in car (not driving)  Provider Location: Office/Clinic  I discussed the limitations, risks, security, and privacy concerns of performing an evaluation and management service by video and the availability of in person appointments. I also discussed with the patient that there may be a patient responsible charge related to this service. The patient expressed understanding and agreed to proceed.  Subjective: PCP: Sonny Masters, FNP  Chief Complaint  Patient presents with   Rash   HPI V shape on right hand at thumb started last Friday. States that she finished abx on Thursday. Had not started anything else new. Has not changed her routine in other ways. States that it is itching, she has scratched "raw" spots. Monday went to Urgent Care and was prescribed a stronger steroid cream states that it is itching badly, slight better. Rheumatology was concerned that it was not concerned. She has not been taking methotrexate as much recently due to recent sinus infection but she did take it on Wednesday.   ROS: Per HPI  Current Outpatient Medications:    acetaminophen (TYLENOL) 650 MG CR tablet, Take 1,300 mg by mouth every 8 (eight) hours as needed for pain., Disp: , Rfl:    antiseptic oral rinse (BIOTENE) LIQD, 15 mLs by Mouth Rinse route as needed for dry mouth., Disp: , Rfl:    aspirin EC 81 MG tablet, Take 81 mg by mouth daily. Swallow whole., Disp: , Rfl:    baclofen (LIORESAL) 10 MG tablet, TAKE 1 TABLET BY MOUTH EACH MORNING AND 1 TABLET AT NOON., Disp: 60 tablet, Rfl: 0   betamethasone dipropionate (DIPROLENE) 0.05 % ointment, Apply topically 2 (two) times daily., Disp: 15 g, Rfl: 0   cholecalciferol (VITAMIN D) 25 MCG (1000 UNIT) tablet, Take 1,000  Units by mouth daily., Disp: , Rfl:    Eptinezumab-jjmr (VYEPTI) 100 MG/ML injection, Inject into the vein every 3 (three) months., Disp: , Rfl:    ferrous sulfate 325 (65 FE) MG tablet, Take 325 mg by mouth daily., Disp: , Rfl:    folic acid (FOLVITE) 1 MG tablet, Take 2 tablets (2 mg total) by mouth daily., Disp: 180 tablet, Rfl: 3   gabapentin (NEURONTIN) 300 MG capsule, Take 300 mg by mouth 3 (three) times daily., Disp: , Rfl:    hydroxychloroquine (PLAQUENIL) 200 MG tablet, TAKE 1 TABLET TWICE DAILY MONDAY THRU FRIDAY FOR RHEUMATOID ARTHRTITIS., Disp: 120 tablet, Rfl: 0   ibuprofen (ADVIL) 200 MG tablet, Take 400 mg by mouth every 8 (eight) hours as needed for moderate pain., Disp: , Rfl:    JARDIANCE 10 MG TABS tablet, Take 10 mg by mouth daily., Disp: , Rfl:    MAGNESIUM PO, Take 500 mg by mouth at bedtime., Disp: , Rfl:    Methotrexate, PF, (RASUVO) 20 MG/0.4ML SOAJ, Inject 20 mg into the skin once a week., Disp: 4.8 mL, Rfl: 0   Multiple Vitamin (MULTIVITAMIN WITH MINERALS) TABS tablet, Take 1 tablet by mouth daily., Disp: , Rfl:    omeprazole (PRILOSEC) 20 MG capsule, Take 40 mg by mouth daily., Disp: , Rfl:    ondansetron (ZOFRAN-ODT) 8 MG disintegrating tablet, Take 8 mg by mouth every 8 (eight) hours as needed for nausea. , Disp: , Rfl:  Polyvinyl Alcohol-Povidone (REFRESH OP), Apply to eye as needed., Disp: , Rfl:    Rimegepant Sulfate (NURTEC) 75 MG TBDP, Take 1 tablet (75 mg total) by mouth daily as needed. For migraines. Take as close to onset of migraine as possible. One daily maximum., Disp: 10 tablet, Rfl: 6   sertraline (ZOLOFT) 100 MG tablet, Take 1 tablet (100 mg total) by mouth daily., Disp: 30 tablet, Rfl: 1   tiZANidine (ZANAFLEX) 4 MG tablet, TAKE ONE TABLET BY MOUTH AT BEDTIME AS NEEDED, Disp: 30 tablet, Rfl: 0  Observations/Objective: There were no vitals filed for this visit. Physical Exam Constitutional:      General: She is awake. She is not in acute  distress.    Appearance: Normal appearance. She is well-developed and well-groomed. She is not ill-appearing, toxic-appearing or diaphoretic.  Pulmonary:     Effort: Pulmonary effort is normal.  Skin:    Comments: Honey crusted rash along hand right   Neurological:     General: No focal deficit present.     Mental Status: She is alert and oriented to person, place, and time.  Psychiatric:        Attention and Perception: Attention and perception normal.        Mood and Affect: Mood and affect normal.        Speech: Speech normal.        Behavior: Behavior normal. Behavior is cooperative.        Thought Content: Thought content normal.        Cognition and Memory: Cognition and memory normal.        Judgment: Judgment normal.     Assessment and Plan: 1. Rash (Primary) Suspect that patient has superimposed infection along hand. Will start medication as below. Recommended that patient start benadryl cream for itching. Did not want to provide hydroxyzine as she is taking muscle relaxer and given age worried for sedation. Encouraged her to start OTC zyrtec or xyzal.  - doxycycline (VIBRA-TABS) 100 MG tablet; Take 1 tablet (100 mg total) by mouth 2 (two) times daily for 7 days.  Dispense: 14 tablet; Refill: 0  Appt time 02/18/2023 05:00 PM Call duration: 00:08:01  Follow Up Instructions: Return if symptoms worsen or fail to improve.   I discussed the assessment and treatment plan with the patient. The patient was provided an opportunity to ask questions, and all were answered. The patient agreed with the plan and demonstrated an understanding of the instructions.   The patient was advised to call back or seek an in-person evaluation if the symptoms worsen or if the condition fails to improve as anticipated.  The above assessment and management plan was discussed with the patient. The patient verbalized understanding of and has agreed to the management plan.   Neale Burly,  DNP-FNP [facility] [address] [place], [place] [address] [phone]

## 2023-02-18 NOTE — Progress Notes (Signed)
Subjective:   Christina Lewis is a 67 y.o. female who presents for Medicare Annual (Subsequent) preventive examination.  Visit Complete: Virtual I connected with  [name] on 02/18/23 by a audio enabled telemedicine application and verified that I am speaking with the correct person using two identifiers.  Patient Location: Home  Provider Location: Home Office  I discussed the limitations of evaluation and management by telemedicine. The patient expressed understanding and agreed to proceed.  Vital Signs: Because this visit was a virtual/telehealth visit, some criteria may be missing or patient reported. Any vitals not documented were not able to be obtained and vitals that have been documented are patient reported.  Patient Medicare AWV questionnaire was completed by the patient on 02/14/23; I have confirmed that all information answered by patient is correct and no changes since this date.  Cardiac Risk Factors include: advanced age (>53men, >39 women);diabetes mellitus;dyslipidemia     Objective:    Today's Vitals   02/18/23 1450  Weight: 162 lb (73.5 kg)  Height: 5\' 4"  (1.626 m)   Body mass index is 27.81 kg/m.     02/18/2023    3:17 PM 11/17/2022    3:20 PM 09/29/2022   12:32 PM 02/09/2022    2:05 PM 11/03/2021    6:48 AM 06/08/2021   11:16 AM 04/23/2021   10:15 AM  Advanced Directives  Does Patient Have a Medical Advance Directive? No No No No No No No  Would patient like information on creating a medical advance directive? Yes (MAU/Ambulatory/Procedural Areas - Information given) No - Patient declined No - Patient declined No - Patient declined No - Patient declined No - Patient declined No - Patient declined    Current Medications (verified) Outpatient Encounter Medications as of 02/18/2023  Medication Sig   acetaminophen (TYLENOL) 650 MG CR tablet Take 1,300 mg by mouth every 8 (eight) hours as needed for pain.   antiseptic oral rinse (BIOTENE) LIQD 15 mLs  by Mouth Rinse route as needed for dry mouth.   aspirin EC 81 MG tablet Take 81 mg by mouth daily. Swallow whole.   baclofen (LIORESAL) 10 MG tablet TAKE 1 TABLET BY MOUTH EACH MORNING AND 1 TABLET AT NOON.   betamethasone dipropionate (DIPROLENE) 0.05 % ointment Apply topically 2 (two) times daily.   cholecalciferol (VITAMIN D) 25 MCG (1000 UNIT) tablet Take 1,000 Units by mouth daily.   Eptinezumab-jjmr (VYEPTI) 100 MG/ML injection Inject into the vein every 3 (three) months.   ferrous sulfate 325 (65 FE) MG tablet Take 325 mg by mouth daily.   folic acid (FOLVITE) 1 MG tablet Take 2 tablets (2 mg total) by mouth daily.   gabapentin (NEURONTIN) 300 MG capsule Take 300 mg by mouth 3 (three) times daily.   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   ibuprofen (ADVIL) 200 MG tablet Take 400 mg by mouth every 8 (eight) hours as needed for moderate pain.   JARDIANCE 10 MG TABS tablet Take 10 mg by mouth daily.   MAGNESIUM PO Take 500 mg by mouth at bedtime.   Methotrexate, PF, (RASUVO) 20 MG/0.4ML SOAJ Inject 20 mg into the skin once a week.   Multiple Vitamin (MULTIVITAMIN WITH MINERALS) TABS tablet Take 1 tablet by mouth daily.   omeprazole (PRILOSEC) 20 MG capsule Take 40 mg by mouth daily.   ondansetron (ZOFRAN-ODT) 8 MG disintegrating tablet Take 8 mg by mouth every 8 (eight) hours  as needed for nausea.    Polyvinyl Alcohol-Povidone (REFRESH OP) Apply to eye as needed.   Rimegepant Sulfate (NURTEC) 75 MG TBDP Take 1 tablet (75 mg total) by mouth daily as needed. For migraines. Take as close to onset of migraine as possible. One daily maximum.   sertraline (ZOLOFT) 100 MG tablet Take 1 tablet (100 mg total) by mouth daily.   tiZANidine (ZANAFLEX) 4 MG tablet TAKE ONE TABLET BY MOUTH AT BEDTIME AS NEEDED   No facility-administered encounter medications on file as of 02/18/2023.    Allergies (verified) Sulfa  antibiotics, Sulfamethoxazole-trimethoprim, Atorvastatin, Codeine, Erythromycin, Metronidazole, Morphine and codeine, and Semaglutide   History: Past Medical History:  Diagnosis Date   Allergy    Anemia    Anxiety    Arthritis    Cataract 2020   Depression    Diabetes mellitus    type 2   Fibromyalgia    GERD (gastroesophageal reflux disease)    Gout    no current problem   Hematuria    History of kidney stones    passed stones and also had surgery to remove one   History of tics    Hyperlipidemia 2007   Ineffective esophageal motility    tx with prilosec, essophageal was stretched   Interstitial cystitis 2016   Migraines    Osteoarthritis    PCOS (polycystic ovarian syndrome) 1993   Pneumonia 2019   PONV (postoperative nausea and vomiting)    Sleep apnea    does not use CPAP   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, took medication for a year   Thyroid nodule    Dr Horald Pollen is monitoring every year   Past Surgical History:  Procedure Laterality Date   ABDOMINAL HYSTERECTOMY  2000   APPENDECTOMY  1976   BALLOON DILATION N/A 11/03/2021   Procedure: Marvis Repress DILATION;  Surgeon: Kerin Salen, MD;  Location: [facility];  Service: Gastroenterology;  Laterality: N/A;   BOTOX INJECTION N/A 11/03/2021   Procedure: BOTOX INJECTION;  Surgeon: Kerin Salen, MD;  Location: [facility];  Service: Gastroenterology;  Laterality: N/A;   BREAST LUMPECTOMY  1989   lt-negative   CARDIOVASCULAR STRESS TEST  2000   CATARACT EXTRACTION W/PHACO Left 12/07/2019   Procedure: CATARACT EXTRACTION PHACO AND INTRAOCULAR LENS PLACEMENT (IOC);  Surgeon: [name], MD;  Location: [facility];  Service: Ophthalmology;  Laterality: Left;  CDE: 5.55   CHOLECYSTECTOMY  1985   COLONOSCOPY     DRUG INDUCED ENDOSCOPY Bilateral 09/29/2022   Procedure: DRUG INDUCED ENDOSCOPY;  Surgeon: Ashok Croon, MD;  Location: [facility];  Service: ENT;  Laterality: Bilateral;  15  MINUTES   ESOPHAGEAL MANOMETRY N/A 08/24/2021   Procedure: ESOPHAGEAL MANOMETRY (EM);  Surgeon: Kerin Salen, MD;  Location: [facility];  Service: Gastroenterology;  Laterality: N/A;   ESOPHAGOGASTRODUODENOSCOPY N/A 11/03/2021   Procedure: ESOPHAGOGASTRODUODENOSCOPY (EGD);  Surgeon: Kerin Salen, MD;  Location: [facility];  Service: Gastroenterology;  Laterality: N/A;   EYE SURGERY  2021   HYSTERECTOMY ABDOMINAL WITH SALPINGECTOMY Bilateral 2000   IMPLANTATION OF HYPOGLOSSAL NERVE STIMULATOR Right 11/24/2022   Procedure: IMPLANTATION OF HYPOGLOSSAL NERVE STIMULATOR;  Surgeon: Ashok Croon, MD;  Location: [facility];  Service: ENT;  Laterality: Right;   KIDNEY STONE SURGERY Right 2014   with stent placement in OR   KNEE ARTHROSCOPY Right 08/22/2013   Procedure: ARTHROSCOPY RIGHT KNEE FOR INFECTION LAVAGE AND DRAINAGE;  Surgeon: Thera Flake., MD;  Location: [facility];  Service: Orthopedics;  Laterality: Right;   KNEE ARTHROSCOPY WITH PATELLA RECONSTRUCTION Right 08/06/2013   Procedure: RIGHT KNEE ARTHROSCOPY WITH MENISCECTOMY MEDIAL, ARTHROSCOPY KNEE WITH DEBRIDEMENT/SHAVING (CHONDROPLASTY) ;  Surgeon: Thera Flake., MD;  Location: [facility];  Service: Orthopedics;  Laterality: Right;   LUMBAR LAMINECTOMY/DECOMPRESSION MICRODISCECTOMY Right 09/11/2019   Procedure: Right Lumbar Three-Four Microdiscectomy;  Surgeon: Maeola Harman, MD;  Location: [facility];  Service: Neurosurgery;  Laterality: Right;  Right Lumbar Three-Four Microdiscectomy   NASAL SINUS SURGERY     x4   OOPHORECTOMY     SHOULDER SURGERY  2011   right shoulder   SPINE SURGERY  2020   THYROID SURGERY  1994   TONSILLECTOMY Bilateral 1974   Family History  Problem Relation Age of Onset   Fibromyalgia Mother    Psoriasis Mother        psoriatic arthritis    Diabetes Mother    Heart disease Mother    Arthritis Mother    Asthma Mother    Hyperlipidemia Mother    Hypertension Mother     Stroke Mother    Psoriasis Sister        psoriatic arthritis    Diabetes Sister    Migraines Sister    Rheum arthritis Sister    Fibromyalgia Sister    Diabetes Sister    Arthritis Sister    Asthma Sister    Diabetes Sister    Hyperlipidemia Sister    Miscarriages / [place] Sister    Arthritis Sister    Asthma Sister    Diabetes Sister    Hyperlipidemia Sister    Hypertension Sister    Miscarriages / [place] Sister    Social History   Socioeconomic History   Marital status: Widowed    Spouse name: Chrissie Noa   Number of children: Not on file   Years of education: Not on file   Highest education level: Bachelor's degree (e.g., BA, AB, BS)  Occupational History   Occupation: retired  Tobacco Use   Smoking status: Never    Passive exposure: Past   Smokeless tobacco: Never  Vaping Use   Vaping status: Never Used  Substance and Sexual Activity   Alcohol use: Never   Drug use: Never   Sexual activity: Not Currently    Birth control/protection: Post-menopausal    Comment: Hysterectomy  Other Topics Concern   Not on file  Social History Narrative   Caffiene 4-5 12 oz cans soda.   Working retired - special ed.   Live with home no kids   Social Drivers of Health   Financial Resource Strain: Low Risk  (02/18/2023)   Overall Financial Resource Strain (CARDIA)    Difficulty of Paying Living Expenses: Not very hard  Food Insecurity: No Food Insecurity (02/18/2023)   Hunger Vital Sign    Worried About Running Out of Food in the Last Year: Never true    Ran Out of Food in the Last Year: Never true  Transportation Needs: No Transportation Needs (02/18/2023)   PRAPARE - Administrator, Civil Service (Medical): No    Lack of Transportation (Non-Medical): No  Physical Activity: Inactive (02/18/2023)   Exercise Vital Sign    Days of Exercise per Week: 0 days    Minutes of Exercise per Session: 0 min  Stress: Stress Concern Present (02/18/2023)   [employer] of [employer]ational [employer] - Occupational Stress Questionnaire    Feeling of Stress : To some extent  Social  Connections: Moderately Isolated (02/18/2023)   Social Connection and Isolation Panel [NHANES]    Frequency of Communication with Friends and Family: More than three times a week    Frequency of Social Gatherings with Friends and Family: Three times a week    Attends Religious Services: 1 to 4 times per year    Active Member of Clubs or Organizations: No    Attends Banker Meetings: Never    Marital Status: Widowed    Tobacco Counseling Counseling given: Not Answered   Clinical Intake:  Pre-visit preparation completed: Yes  Pain : No/denies pain     Diabetes: Yes CBG done?: No Did pt. bring in CBG monitor from home?: No  How often do you need to have someone help you when you read instructions, pamphlets, or other written materials from your doctor or pharmacy?: 1 - Never  Interpreter Needed?: No  Information entered by :: Kandis Fantasia LPN   Activities of Daily Living    02/14/2023    8:33 AM 11/17/2022    3:27 PM  In your present state of health, do you have any difficulty performing the following activities:  Hearing? 0   Vision? 0   Difficulty concentrating or making decisions? 0   Walking or climbing stairs? 0   Dressing or bathing? 0   Doing errands, shopping? 0 0  Preparing Food and eating ? N   Using the Toilet? N   In the past six months, have you accidently leaked urine? N   Do you have problems with loss of bowel control? N   Managing your Medications? N   Managing your Finances? N   Housekeeping or managing your Housekeeping? N     Patient Care Team: Sonny Masters, FNP as PCP - General (Family Medicine) Smitty Cords, OD (Optometry)  Indicate any recent Medical Services you may have received from other than [facility] providers in the past year (date may be approximate).     Assessment:   This is a routine wellness  examination for Christina Lewis.  Hearing/Vision screen Hearing Screening - Comments:: Denies hearing difficulties   Vision Screening - Comments::  Wears rx glasses - up to date with routine eye exams with [facility]     Goals Addressed             This Visit's Progress    COMPLETED: Patient Stated       02/09/2022 AWV Goal: Keep All Scheduled Appointments  Over the next year, patient will attend all scheduled appointments with their PCP and any specialists that they see.      Remain active and independent        Depression Screen    02/18/2023    3:14 PM 02/11/2023   10:30 AM 09/01/2022   11:17 AM 03/09/2022    9:27 AM 02/09/2022    2:06 PM 10/15/2021   12:06 PM  PHQ 2/9 Scores  PHQ - 2 Score 4 4 6  6 6   PHQ- 9 Score 14 14 17  11 19      Information is confidential and restricted. Go to Review Flowsheets to unlock data.    Fall Risk    02/18/2023    3:16 PM 02/14/2023    8:33 AM 02/11/2023   10:30 AM 09/01/2022   11:17 AM 02/09/2022    2:06 PM  Fall Risk   Falls in the past year? 0 0 0 0 0  Number falls in past yr: 0  Injury with Fall? 0      Risk for fall due to : No Fall Risks  History of fall(s)    Follow up Falls prevention discussed;Education provided;Falls evaluation completed  Falls evaluation completed      MEDICARE RISK AT HOME: Medicare Risk at Home Any stairs in or around the home?: (Patient-Rptd) Yes If so, are there any without handrails?: (Patient-Rptd) No Home free of loose throw rugs in walkways, pet beds, electrical cords, etc?: (Patient-Rptd) Yes Adequate lighting in your home to reduce risk of falls?: (Patient-Rptd) Yes Life alert?: (Patient-Rptd) No Use of a cane, walker or w/c?: (Patient-Rptd) No Grab bars in the bathroom?: (Patient-Rptd) No Shower chair or bench in shower?: (Patient-Rptd) No Elevated toilet seat or a handicapped toilet?: (Patient-Rptd) No  TIMED UP AND GO:  Was the test performed?  No    Cognitive Function:         02/18/2023    3:17 PM 02/09/2022    2:12 PM  6CIT Screen  What Year? 0 points 0 points  What month? 0 points 0 points  What time? 0 points 0 points  Count back from 20 0 points 0 points  Months in reverse 0 points 0 points  Repeat phrase 0 points 0 points  Total Score 0 points 0 points    Immunizations Immunization History  Administered Date(s) Administered   Tdap 03/04/2021   Zoster Recombinant(Shingrix) 10/15/2021    TDAP status: Up to date  Flu Vaccine status: Declined, Education has been provided regarding the importance of this vaccine but patient still declined. Advised may receive this vaccine at local pharmacy or Health Dept. Aware to provide a copy of the vaccination record if obtained from local pharmacy or Health Dept. Verbalized acceptance and understanding.  Pneumococcal vaccine status: Declined,  Education has been provided regarding the importance of this vaccine but patient still declined. Advised may receive this vaccine at local pharmacy or Health Dept. Aware to provide a copy of the vaccination record if obtained from local pharmacy or Health Dept. Verbalized acceptance and understanding.   Covid-19 vaccine status: Declined, Education has been provided regarding the importance of this vaccine but patient still declined. Advised may receive this vaccine at local pharmacy or Health Dept.or vaccine clinic. Aware to provide a copy of the vaccination record if obtained from local pharmacy or Health Dept. Verbalized acceptance and understanding.  Qualifies for Shingles Vaccine? Yes   Zostavax completed No   Shingrix Completed?: No.    Education has been provided regarding the importance of this vaccine. Patient has been advised to call insurance company to determine out of pocket expense if they have not yet received this vaccine. Advised may also receive vaccine at local pharmacy or Health Dept. Verbalized acceptance and understanding.  Screening Tests Health  Maintenance  Topic Date Due   Colonoscopy  Never done   COVID-19 Vaccine (1) 02/27/2023 (Originally 02/03/1961)   Zoster Vaccines- Shingrix (2 of 2) 05/12/2023 (Originally 12/10/2021)   INFLUENZA VACCINE  05/23/2023 (Originally 09/23/2022)   Pneumonia Vaccine 42+ Years old (1 of 2 - PCV) 02/11/2024 (Originally 02/04/1962)   DEXA SCAN  02/11/2024 (Originally 02/03/2022)   HEMOGLOBIN A1C  08/12/2023   OPHTHALMOLOGY EXAM  10/22/2023   Diabetic kidney evaluation - eGFR measurement  02/11/2024   Diabetic kidney evaluation - Urine ACR  02/11/2024   FOOT EXAM  02/11/2024   Medicare Annual Wellness (AWV)  02/18/2024   MAMMOGRAM  07/06/2024   DTaP/Tdap/Td (2 - Td or Tdap) 03/05/2031  Hepatitis C Screening  Completed   HPV VACCINES  Aged Out    Health Maintenance  Health Maintenance Due  Topic Date Due   Colonoscopy  Never done    Colorectal cancer screening:  Records requested from [facility]  Mammogram status: Completed 07/07/22. Repeat every year  Bone Density status: Ordered 02/11/23. Pt provided with contact info and advised to call to schedule appt.  Lung Cancer Screening: (Low Dose CT Chest recommended if Age 3-80 years, 20 pack-year currently smoking OR have quit w/in 15years.) does not qualify.   Lung Cancer Screening Referral: n/a  Additional Screening:  Hepatitis C Screening: does qualify; Completed 09/29/21  Vision Screening: Recommended annual ophthalmology exams for early detection of glaucoma and other disorders of the eye. Is the patient up to date with their annual eye exam?  Yes  Who is the provider or what is the name of the office in which the patient attends annual eye exams? [facility] If pt is not established with a provider, would they like to be referred to a provider to establish care? No .   Dental Screening: Recommended annual dental exams for proper oral hygiene  Diabetic Foot Exam: Diabetic Foot Exam: Completed 02/11/23  Community Resource  Referral / Chronic Care Management: CRR required this visit?  No   CCM required this visit?  No     Plan:     I have personally reviewed and noted the following in the patient's chart:   Medical and social history Use of alcohol, tobacco or illicit drugs  Current medications and supplements including opioid prescriptions. Patient is not currently taking opioid prescriptions. Functional ability and status Nutritional status Physical activity Advanced directives List of other physicians Hospitalizations, surgeries, and ER visits in previous 12 months Vitals Screenings to include cognitive, depression, and falls Referrals and appointments  In addition, I have reviewed and discussed with patient certain preventive protocols, quality metrics, and best practice recommendations. A written personalized care plan for preventive services as well as general preventive health recommendations were provided to patient.     Kandis Fantasia [place], [place]   [id number]/11/[id number]   After Visit Summary: (MyChart) Due to this being a telephonic visit, the after visit summary with patients personalized plan was offered to patient via MyChart   Nurse Notes: No concerns at this time

## 2023-02-25 ENCOUNTER — Other Ambulatory Visit: Payer: Self-pay | Admitting: Family Medicine

## 2023-02-25 DIAGNOSIS — R519 Headache, unspecified: Secondary | ICD-10-CM

## 2023-02-25 DIAGNOSIS — F339 Major depressive disorder, recurrent, unspecified: Secondary | ICD-10-CM

## 2023-02-25 DIAGNOSIS — E1169 Type 2 diabetes mellitus with other specified complication: Secondary | ICD-10-CM

## 2023-02-25 DIAGNOSIS — H6993 Unspecified Eustachian tube disorder, bilateral: Secondary | ICD-10-CM

## 2023-02-25 DIAGNOSIS — M5126 Other intervertebral disc displacement, lumbar region: Secondary | ICD-10-CM

## 2023-02-25 DIAGNOSIS — M0609 Rheumatoid arthritis without rheumatoid factor, multiple sites: Secondary | ICD-10-CM

## 2023-02-25 DIAGNOSIS — M6281 Muscle weakness (generalized): Secondary | ICD-10-CM

## 2023-02-25 DIAGNOSIS — G43009 Migraine without aura, not intractable, without status migrainosus: Secondary | ICD-10-CM

## 2023-02-25 DIAGNOSIS — G4733 Obstructive sleep apnea (adult) (pediatric): Secondary | ICD-10-CM

## 2023-02-25 DIAGNOSIS — E669 Obesity, unspecified: Secondary | ICD-10-CM

## 2023-02-25 NOTE — Progress Notes (Signed)
 New insurance, needs new referrals for specialists.

## 2023-03-02 ENCOUNTER — Other Ambulatory Visit: Payer: Self-pay | Admitting: Rheumatology

## 2023-03-02 NOTE — Telephone Encounter (Signed)
 Last Fill: 01/04/2023  Next Visit: 07/21/2023  Last Visit: 02/18/2023  Dx: Fibromyalgia   Current Dose per office note on 02/18/2023: not discussed  Okay to refill Tizanidine?

## 2023-03-03 DIAGNOSIS — M5416 Radiculopathy, lumbar region: Secondary | ICD-10-CM | POA: Diagnosis not present

## 2023-03-04 DIAGNOSIS — F411 Generalized anxiety disorder: Secondary | ICD-10-CM | POA: Diagnosis not present

## 2023-03-04 DIAGNOSIS — F331 Major depressive disorder, recurrent, moderate: Secondary | ICD-10-CM | POA: Diagnosis not present

## 2023-03-04 DIAGNOSIS — F6381 Intermittent explosive disorder: Secondary | ICD-10-CM | POA: Diagnosis not present

## 2023-03-06 ENCOUNTER — Other Ambulatory Visit: Payer: Self-pay | Admitting: Physician Assistant

## 2023-03-07 ENCOUNTER — Encounter (INDEPENDENT_AMBULATORY_CARE_PROVIDER_SITE_OTHER): Payer: Self-pay | Admitting: Otolaryngology

## 2023-03-07 NOTE — Telephone Encounter (Signed)
 Last Fill: 02/07/2023  Next Visit: 07/21/2023  Last Visit: 02/18/2023  Dx: Fibromyalgia   Current Dose per office note on 02/18/2023: not discussed  Okay to refill Baclofen?

## 2023-03-11 DIAGNOSIS — M722 Plantar fascial fibromatosis: Secondary | ICD-10-CM | POA: Insufficient documentation

## 2023-03-14 ENCOUNTER — Other Ambulatory Visit (HOSPITAL_COMMUNITY): Payer: Self-pay | Admitting: Psychiatry

## 2023-03-14 DIAGNOSIS — H26492 Other secondary cataract, left eye: Secondary | ICD-10-CM | POA: Diagnosis not present

## 2023-03-14 DIAGNOSIS — E119 Type 2 diabetes mellitus without complications: Secondary | ICD-10-CM | POA: Diagnosis not present

## 2023-03-14 DIAGNOSIS — F419 Anxiety disorder, unspecified: Secondary | ICD-10-CM

## 2023-03-14 DIAGNOSIS — F4321 Adjustment disorder with depressed mood: Secondary | ICD-10-CM

## 2023-03-14 DIAGNOSIS — H2511 Age-related nuclear cataract, right eye: Secondary | ICD-10-CM | POA: Diagnosis not present

## 2023-03-14 DIAGNOSIS — F331 Major depressive disorder, recurrent, moderate: Secondary | ICD-10-CM

## 2023-03-14 DIAGNOSIS — H04123 Dry eye syndrome of bilateral lacrimal glands: Secondary | ICD-10-CM | POA: Diagnosis not present

## 2023-03-15 DIAGNOSIS — F331 Major depressive disorder, recurrent, moderate: Secondary | ICD-10-CM | POA: Diagnosis not present

## 2023-03-15 DIAGNOSIS — F411 Generalized anxiety disorder: Secondary | ICD-10-CM | POA: Diagnosis not present

## 2023-03-15 DIAGNOSIS — F6381 Intermittent explosive disorder: Secondary | ICD-10-CM | POA: Diagnosis not present

## 2023-03-16 ENCOUNTER — Ambulatory Visit (INDEPENDENT_AMBULATORY_CARE_PROVIDER_SITE_OTHER): Payer: PPO | Admitting: Otolaryngology

## 2023-03-16 ENCOUNTER — Encounter (INDEPENDENT_AMBULATORY_CARE_PROVIDER_SITE_OTHER): Payer: Self-pay | Admitting: Otolaryngology

## 2023-03-16 ENCOUNTER — Ambulatory Visit (INDEPENDENT_AMBULATORY_CARE_PROVIDER_SITE_OTHER): Payer: Medicare PPO | Admitting: Podiatry

## 2023-03-16 ENCOUNTER — Encounter: Payer: Self-pay | Admitting: Podiatry

## 2023-03-16 ENCOUNTER — Ambulatory Visit (INDEPENDENT_AMBULATORY_CARE_PROVIDER_SITE_OTHER): Payer: PPO

## 2023-03-16 VITALS — BP 126/79 | HR 89

## 2023-03-16 DIAGNOSIS — M722 Plantar fascial fibromatosis: Secondary | ICD-10-CM | POA: Diagnosis not present

## 2023-03-16 DIAGNOSIS — R0981 Nasal congestion: Secondary | ICD-10-CM | POA: Diagnosis not present

## 2023-03-16 DIAGNOSIS — H9319 Tinnitus, unspecified ear: Secondary | ICD-10-CM

## 2023-03-16 DIAGNOSIS — J3089 Other allergic rhinitis: Secondary | ICD-10-CM

## 2023-03-16 DIAGNOSIS — G4733 Obstructive sleep apnea (adult) (pediatric): Secondary | ICD-10-CM

## 2023-03-16 DIAGNOSIS — M79672 Pain in left foot: Secondary | ICD-10-CM

## 2023-03-16 DIAGNOSIS — Z9889 Other specified postprocedural states: Secondary | ICD-10-CM

## 2023-03-16 DIAGNOSIS — R0982 Postnasal drip: Secondary | ICD-10-CM

## 2023-03-16 MED ORDER — TRIAMCINOLONE ACETONIDE 10 MG/ML IJ SUSP
10.0000 mg | Freq: Once | INTRAMUSCULAR | Status: AC
  Administered 2023-03-16: 10 mg via INTRA_ARTICULAR

## 2023-03-16 MED ORDER — DESLORATADINE 5 MG PO TABS: 5.0000 mg | ORAL_TABLET | Freq: Every day | ORAL | 3 refills | Status: DC

## 2023-03-16 MED ORDER — AZELASTINE HCL 0.1 % NA SOLN: 2.0000 | Freq: Two times a day (BID) | NASAL | 12 refills | Status: DC

## 2023-03-16 NOTE — Patient Instructions (Signed)
Christina Lewis Med Nasal Saline Rinse   - start nasal saline rinses with [employer] available over the counter or online to help with nasal congestion

## 2023-03-16 NOTE — Progress Notes (Signed)
ENT Progress Note  Update 03/16/23 Discussed the use of AI scribe software for clinical note transcription with the patient, who gave verbal consent to proceed.  History of Present Illness   The patient is a 69 yoF who presents with persistent nasal congestion symptoms, which have not improved despite previous treatment. They describe a recent episode of significant nasal discharge, initially characterized by dark gray mucus, which later turned green. This has since resolved, but they continue to experience a sensation of nasal congestion.  They report not having received a previously prescribed nasal steroid rinse, and have not been using any other medications recently. They did, however, complete a course of antibiotics and steroids prescribed during their last visit.  In addition to these symptoms, the patient also mentions the use of an Inspire device, with no reported issues. They have an upcoming appointment with a sleep specialist.  The patient has previously undergone a procedure for nasal turbinate reduction and sinus surgery.     Prior visit records reviewed: Update 02/04/23  History of Present Illness   The patient, with a history of prior sinus infections and sinus surgery, presents with recurrent symptoms despite treatment with antibiotics and steroids given by PCP. They describe a pattern of improvement following treatment, only to relapse within a week or two. Symptoms include ear pressure, a sensation of struggling to breathe though her nose, and constant need to clear the throat, blow her nose. These symptoms have been disruptive to their sleep.   A CT sinuses was performed 10/2022, with patent paranasal sinuses and evidence of prior FESS. She developed cold/URI sx right after CT.    The patient has been using Flonase for their nasal symptoms, having switched from another nasal spray due to excessive dryness. They have also been taking Xyzal, Sudafed, and Mucinex, and using  saline rinses to alleviate their symptoms.   The patient also reports a sensation of fluid in their right ear. She is doing well with Inspire implant, and currently using it during sleep.   Records reviewed PCP office visit 02/01/23 The patient, with a history of sinus issues, presents with recurrent symptoms despite previous treatment with antibiotics and steroids. They describe a pattern of improvement following treatment, only to relapse within a week or two. Symptoms include blocked ears, a sensation of struggling to breathe, and constant need to clear the throat, blow the nose, and snore. These symptoms have been disruptive to their sleep.   The patient had seen an ENT specialist a few weeks prior, who found no significant issues at the time. However, the patient's symptoms have since returned. A CT scan was performed, but the patient fell ill after the scan. The patient also mentions difficulty controlling their blood sugar levels and has an upcoming appointment with their endocrinologist.   The patient has been using Flonase for their nasal symptoms, having switched from another nasal spray due to excessive dryness. They have also been taking Xyzal, Sudafed, and Mucinex, and using saline solution to alleviate their symptoms.   The patient also reports a sensation of fluid in their right ear and a swollen lymph node. They have a history of elevated liver enzymes and white blood cell count. The patient is scheduled to see the ENT specialist again soon  Update 01/14/23:   Discussed the use of AI scribe software for clinical note transcription with the patient, who gave verbal consent to proceed.  History of Present Illness   The patient, with a recent history of  Inspire implant surgery, presents for follow-up. The patient reports mild discomfort at the site of submental incision at night. Despite these symptoms, the patient expresses satisfaction with the overall surgical outcome and how  incisions appear currently.   In addition to post-surgical discomfort, the patient has been dealing with a persistent sinus issues/nasal congestion. They report a recent severe sinus infection, which required multiple changes in antibiotic therapy, including two courses of Augmentin. The infection has since improved, but the patient continues to experience significant post-nasal drainage, particularly at night, to the point of choking. Prior to the infection, the patient had been experiencing chronic nasal congestion and a constant need to clear the nose, which had temporarily resolved with changes in medication but has since returned.  The patient is currently on a regimen of allergy medications, including nasal sprays. The patient has not been using saline rinses but has been using saline sprays.     Initial evaluation 09/22/22 Reason for Consult: OSA, nasal congestion and facial pain/pressure/post-nasal drainage/headaches/tinnitus    HPI: Christina Lewis is an 68 y.o. female with hx RA affecting small joints, f.b Rheum on Methotrexate and Plaquenil, hx of OSA, previously on CPAP for many years, however, discontinued CPAP use 2/2 being unable to tolerate the mask, here for multiple complaints.   1)  She is here for nasal congestion, nasal obstruction, and frontal headaches for years, feels nasal drainage and post-nasal drainage. She has decreased sense of smell, and she thinks it happened after covid 2021. Had allergy testing 4 years ago and it was negative. She had allergy shots for 4 yrs when she was a child. On Flonase and Zyrtec. She has hx of migraines, and feels they are under control after starting an infusion medication (new drug for migraines). She has frontal headache on the left when it acts up and with her sinus sx. 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 No new sx   3) hx of OSA and had three CPAP machines (used one for 1.5 yrs) and diagnosed in 2002. She stopped using it due to intoleence and was recently re-established with sleep medicine. Recent sleep study done with [facility] neurology and she is interested in [place] implant.   3) Bilateral tinnitus, water in her ear, worse on the left, and she had ear infections in her ears and chronic sinus all her life, she  4) She reports hx of Esophageal dysmotility/GERD - on Prilosec.    Records Reviewed:  Being seen by Neurology for migraines - last visit with NP [place] 04/08/22 - migraines behind left eye with aura sx   TFTs have always normal has Endocrinologist.  Thyroid U/S 05/06/22  Estimated total number of nodules >/= 1 cm: 2   Number of spongiform nodules >/=  2 cm not described below (TR1): 0   Number of mixed cystic and solid nodules >/= 1.5 cm not described below (TR2): 0   _________________________________________________________   Nodule # 4: Previously biopsied nodule in the left mid gland is unchanged at 1.2 x 1.2 x 1.1 cm.   Nodule # 6: Previously biopsied nodule in the left inferior gland is unchanged at 1.8 x 1.6 x 1.2 cm.   Additional small subcentimeter thyroid nodules again noted scattered throughout the right gland. No interval change. No  new nodules or suspicious features.   IMPRESSION: 1. Previously biopsied nodules in the left mid and left lower gland remain unchanged. Greater than 10 years of stability is consistent with benignity. 2. No new nodules or suspicious features.    Past Medical History:  Diagnosis Date   Allergy    Anemia    Anxiety    Arthritis    Cataract 2020   Depression    Diabetes mellitus    type 2   Fibromyalgia    GERD (gastroesophageal reflux disease)    Gout    no current problem   Hematuria    History of kidney stones    passed stones and also  had surgery to remove one   History of tics    Hyperlipidemia 2007   Ineffective esophageal motility    tx with prilosec, essophageal was stretched   Interstitial cystitis 2016   Migraines    Osteoarthritis    PCOS (polycystic ovarian syndrome) 1993   Pneumonia 2019   PONV (postoperative nausea and vomiting)    Sleep apnea    does not use CPAP   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, took medication for a year   Thyroid nodule    Dr Horald Pollen is monitoring every year    Past Surgical History:  Procedure Laterality Date   ABDOMINAL HYSTERECTOMY  2000   APPENDECTOMY  1976   BALLOON DILATION N/A 11/03/2021   Procedure: Marvis Repress DILATION;  Surgeon: Kerin Salen, MD;  Location: [facility];  Service: Gastroenterology;  Laterality: N/A;   BOTOX INJECTION N/A 11/03/2021   Procedure: BOTOX INJECTION;  Surgeon: Kerin Salen, MD;  Location: [facility];  Service: Gastroenterology;  Laterality: N/A;   BREAST LUMPECTOMY  1989   lt-negative   CARDIOVASCULAR STRESS TEST  2000   CATARACT EXTRACTION W/PHACO Left 12/07/2019   Procedure: CATARACT EXTRACTION PHACO AND INTRAOCULAR LENS PLACEMENT (IOC);  Surgeon: [name], MD;  Location: [facility];  Service: Ophthalmology;  Laterality: Left;  CDE: 5.55   CHOLECYSTECTOMY  1985   COLONOSCOPY     DRUG INDUCED ENDOSCOPY Bilateral 09/29/2022   Procedure: DRUG INDUCED ENDOSCOPY;  Surgeon: Ashok Croon, MD;  Location: [facility];  Service: ENT;  Laterality: Bilateral;  15 MINUTES   ESOPHAGEAL MANOMETRY N/A 08/24/2021   Procedure: ESOPHAGEAL MANOMETRY (EM);  Surgeon: Kerin Salen, MD;  Location: [facility];  Service: Gastroenterology;  Laterality: N/A;   ESOPHAGOGASTRODUODENOSCOPY N/A 11/03/2021   Procedure: ESOPHAGOGASTRODUODENOSCOPY (EGD);  Surgeon: Kerin Salen, MD;  Location: [facility];  Service: Gastroenterology;  Laterality: N/A;   EYE SURGERY  2021   HYSTERECTOMY ABDOMINAL WITH SALPINGECTOMY Bilateral 2000    IMPLANTATION OF HYPOGLOSSAL NERVE STIMULATOR Right 11/24/2022   Procedure: IMPLANTATION OF HYPOGLOSSAL NERVE STIMULATOR;  Surgeon: Ashok Croon, MD;  Location: [facility];  Service: ENT;  Laterality: Right;   KIDNEY STONE SURGERY Right 2014   with stent placement in OR   KNEE ARTHROSCOPY Right 08/22/2013   Procedure: ARTHROSCOPY RIGHT KNEE FOR INFECTION LAVAGE AND DRAINAGE;  Surgeon: Thera Flake., MD;  Location: [facility];  Service: Orthopedics;  Laterality: Right;   KNEE ARTHROSCOPY WITH PATELLA RECONSTRUCTION Right 08/06/2013   Procedure: RIGHT KNEE ARTHROSCOPY WITH MENISCECTOMY MEDIAL, ARTHROSCOPY KNEE WITH DEBRIDEMENT/SHAVING (CHONDROPLASTY) ;  Surgeon: Thera Flake., MD;  Location: [facility];  Service: Orthopedics;  Laterality: Right;   LUMBAR LAMINECTOMY/DECOMPRESSION MICRODISCECTOMY Right 09/11/2019   Procedure: Right Lumbar Three-Four Microdiscectomy;  Surgeon: Maeola Harman, MD;  Location: [facility];  Service: Neurosurgery;  Laterality: Right;  Right Lumbar Three-Four Microdiscectomy   NASAL SINUS SURGERY     x4   OOPHORECTOMY     SHOULDER SURGERY  2011   right shoulder   SPINE SURGERY  2020   THYROID SURGERY  1994   TONSILLECTOMY Bilateral 1974    Family History  Problem Relation Age of Onset   Fibromyalgia Mother    Psoriasis Mother        psoriatic arthritis    Diabetes Mother    Heart disease Mother    Arthritis Mother    Asthma Mother    Hyperlipidemia Mother    Hypertension Mother    Stroke Mother    Psoriasis Sister        psoriatic arthritis    Diabetes Sister    Migraines Sister    Rheum arthritis Sister    Fibromyalgia Sister    Diabetes Sister    Arthritis Sister    Asthma Sister    Diabetes Sister    Hyperlipidemia Sister    Miscarriages / [place] Sister    Arthritis Sister    Asthma Sister    Diabetes Sister    Hyperlipidemia Sister    Hypertension Sister    Miscarriages / [place] Sister     Social  History:  reports that she has never smoked. She has been exposed to tobacco smoke. She has never used smokeless tobacco. She reports that she does not drink alcohol and does not use drugs.  Allergies:  Allergies  Allergen Reactions   Sulfa Antibiotics Anaphylaxis   Sulfamethoxazole-Trimethoprim Anaphylaxis   Atorvastatin Other (See Comments)    Severe Muscle and Joint pain with ALL statins    Codeine Itching   Erythromycin Nausea And Vomiting   Metronidazole Diarrhea   Morphine And Codeine Itching   Semaglutide Nausea And Vomiting    Ozempic*    Medications: I have reviewed the patient's current medications.  The PMH, PSH, Medications, Allergies, and SH were reviewed and updated.  ROS: Constitutional: Negative for fever, weight loss and weight gain. Cardiovascular: Negative for chest pain and dyspnea on exertion. Respiratory: Is not experiencing shortness of breath at rest. Gastrointestinal: Negative for nausea and vomiting. Neurological: Negative for headaches. Psychiatric: The patient is not nervous/anxious  Blood pressure 126/79, pulse 89, SpO2 98%.  PHYSICAL EXAM:  Exam: General: Well-developed, well-nourished Respiratory Respiratory effort: Equal inspiration and expiration without stridor Cardiovascular Peripheral Vascular: Warm extremities with equal color/perfusion Eyes: No nystagmus with equal extraocular motion bilaterally Neuro/Psych/Balance: Patient oriented to person, place, and time; Appropriate mood and affect; Gait is intact with no imbalance; Cranial nerves I-XII are intact Head and Face Inspection: Normocephalic and atraumatic without mass or lesion Palpation: Facial skeleton intact without bony stepoffs Salivary Glands: No mass or tenderness Facial Strength: Facial motility symmetric and full bilaterally ENT Pinna: External ear intact and fully developed External canal: Canal is patent with intact skin Tympanic Membrane: Clear and mobile External  Nose: No scar or anatomic deformity Internal Nose: Septum is straight. No polyp, or purulence but extensive crusting bilaterally at the inferior turbinate level. Mucosal edema and erythema present. Mild crusting present along middle meatus bilaterally Bilateral inferior turbinate hypertrophy.  Lips, Teeth, and gums: Mucosa and teeth intact and viable TMJ: No pain to palpation with full mobility Oral cavity/oropharynx: No erythema or exudate, no lesions present Nasopharynx: No mass or lesion with intact mucosa Neck Neck and Trachea: Midline trachea without mass or lesion Thyroid: No mass  or nodularity Lymphatics: No lymphadenopathy Right submental and chest wall incision c/d/I completely healed at this point  Procedure:   PROCEDURE NOTE: nasal endoscopy  Preoperative diagnosis: chronic nasal congestion symptoms  Postoperative diagnosis: same  Procedure: Diagnostic nasal endoscopy (78295)  Surgeon: Ashok Croon, M.D.  Anesthesia: Topical lidocaine and Afrin  H&P REVIEW: The patient's history and physical were reviewed today prior to procedure. All medications were reviewed and updated as well. Complications: None Condition is stable throughout exam Indications and consent: The patient presents with symptoms of chronic sinusitis not responding to previous therapies. All the risks, benefits, and potential complications were reviewed with the patient preoperatively and informed consent was obtained. The time out was completed with confirmation of the correct procedure.   Procedure: The patient was seated upright in the clinic. Topical lidocaine and Afrin were applied to the nasal cavity. After adequate anesthesia had occurred, the rigid nasal endoscope was passed into the nasal cavity. The nasal mucosa, turbinates, septum, and sinus drainage pathways were visualized bilaterally. This revealed evidence of prior sinus surgery and no purulence or significant secretions that might be  cultured. There were no polyps or sites of significant inflammation. The mucosa was intact and there was no crusting present. The scope was then slowly withdrawn and the patient tolerated the procedure well. There were no complications or blood loss.   Studies Reviewed: CT sinuses 11/18/22  FINDINGS: Paranasal sinuses:   Frontal: Normally aerated. Patent frontal sinus drainage pathways.   Ethmoid: Postsurgical changes from prior bilateral inferior ethmoidectomies. Normally aerated.   Maxillary: Postsurgical changes from bilateral maxillary antrostomies. There is trace mucosal thickening of the floor of the left maxillary sinus.   Sphenoid: Normally aerated. Patent sphenoethmoidal recesses.   Right ostiomeatal unit: Patent.   Left ostiomeatal unit: Patent.   Nasal passages: Patent. Intact nasal septum is midline.   Anatomy: There is pneumatization superior to both anterior ethmoid notches. Symmetric and intact olfactory grooves and fovea ethmoidalis, Keros II (4-28mm). Sellar sphenoid pneumatization pattern. No dehiscence of carotid or optic canals. No onodi cell.   Other: Orbits and intracranial compartment are unremarkable. Left lens replacement. Visible mastoid air cells are normally aerated.   IMPRESSION: Postsurgical changes from prior FESS with trace mucosal thickening of the floor of the left maxillary sinus. Otherwise, the paranasal sinuses are normally aerated with patent sinus drainage pathways.    Assessment/Plan: Encounter Diagnoses  Name Primary?   Obstructive sleep apnea Yes   Chronic nasal congestion    Environmental and seasonal allergies    Post-nasal drip    History of endoscopic sinus surgery        OSA and CPAP intolerance - s/p Inspire Implant and doing well, incisions look good today  - continue f/u with Sleep Medicine for device settings optimization  Chronic nasal congestion after URI sx end of September, 2024, with sx of not being able to  breath through her nose, ear pressure, currently on Omnicef and Prednisone given by PCP. Sx with minimal improvement thus far. Nasal endoscopy without purulence or pus, mild crusting noted. CT sinuses 10/2022 without evidence of chronic sinusitis with evidence of FESS b/l. - reviewed scan results with the patient  - continue nasal saline rinses and add steroids to the rinse (Rx faxed again) - finish abx and steroids  - continue Clarinex 5 mg daily, and Azelastine, hold Flonase when on nasal rinses with added steroids  Bilateral tinnitus described as ocean sounds and ear pressure, unable to pop her ears, unremarkable  ear exam, and no evidence of middle ear effusion, DDx age-related hearing loss versus eustachian tube dysfunction vs both - her current sx are c/w ETD - finish course of prednisone and continue management of nasal congestion as above  - will consider Audiogram in the future   Thyroid nodules -she will continue to follow-up with serial thyroid ultrasounds ordered by her PCP.  Prior workup including FNA not concerning for atypical cells and normal TFTs in the past.   I spent 30 minutes in total face-to-face time and in reviewing records during pre-charting, more than 50% of which was spent in counseling and coordination of care, reviewing test results, reviewing medications and treatment regimen and/or in discussing or reviewing the diagnosis, the prognosis and treatment options. Pertinent laboratory and imaging test results that were available during this visit with the patient were reviewed by me and considered in my medical decision making (see chart for details).   Update 03/16/23 Assessment and Plan    Recurrent Nasal Congestion  Persistent nasal congestion and inflammation, particularly on the left side, with dark gray and green mucus. Examination including nasal endoscopy without evidence of sinus infection, polyps, or pus. Has not used previously prescribed steroid nasal rinses due  to pharmacy communication issue. History of antibiotics and steroids for viral illness a few weeks ago. Nasal sprays may be insufficient due to tight nasal space; saline rinses recommended to wash out potential irritants. - Refax prescription for steroid rinse  - Start regular Neilmed saline rinses - Refill Clarinex 5 mg daily and azelastine 2 puffs b/l nares NID for allergies - Schedule follow-up in a few months or call if needed  Tinnitus Ringing in the ears, already had hearing test.  - No immediate action required  Sleep Apnea (Inspire Therapy) Using Inspire device for sleep apnea with good results. Follow-up with sleep doctor next week. - Continue using Inspire device - Follow-up with sleep doctors next week.        Ashok Croon, MD Otolaryngology [facility] Phone: [phone] Fax: [phone]    03/16/2023, 5:18 PM

## 2023-03-17 NOTE — Progress Notes (Signed)
Subjective:   Patient ID: Christina Lewis, female   DOB: 68 y.o.   MRN: [id number]   HPI Patient states she has developed severe discomfort in her heels left over right over the last few months and they are hard to walk on and its just been really bad over the last few weeks.  Patient has had no other health changes    ROS      Objective:  Physical Exam  Neurovascular status intact moderate flatfoot deformity inflammation fluid of the plantar heel region bilateral with left being worse than right with significant depression of the arch noted.  Good digital perfusion      Assessment:  Acute plantar fasciitis left over right     Plan:  H&P x-rays taken sterile prep injected the fascia at insertion 3 mg Kenalog 5 mg Xylocaine applied fascial brace left instructed on shoe gear modifications stretching reappoint as needed  X-rays indicate small spur formation no indication stress fracture arthritis moderate depression of the arch on weightbearing lateral views

## 2023-03-21 ENCOUNTER — Encounter: Payer: Self-pay | Admitting: Neurology

## 2023-03-21 DIAGNOSIS — G43009 Migraine without aura, not intractable, without status migrainosus: Secondary | ICD-10-CM

## 2023-03-21 MED ORDER — NURTEC 75 MG PO TBDP: 75.0000 mg | ORAL_TABLET | Freq: Every day | ORAL | 5 refills | Status: DC | PRN

## 2023-03-22 NOTE — Telephone Encounter (Signed)
Located a partial fax from [employer]. I sent back a request for them to resend everything. We did not get the application.

## 2023-03-23 ENCOUNTER — Ambulatory Visit (INDEPENDENT_AMBULATORY_CARE_PROVIDER_SITE_OTHER): Payer: PPO | Admitting: Podiatry

## 2023-03-23 DIAGNOSIS — M722 Plantar fascial fibromatosis: Secondary | ICD-10-CM | POA: Diagnosis not present

## 2023-03-23 MED ORDER — PREDNISONE 10 MG PO TABS: ORAL_TABLET | ORAL | 0 refills | Status: DC

## 2023-03-24 ENCOUNTER — Other Ambulatory Visit: Payer: Self-pay | Admitting: Physician Assistant

## 2023-03-24 ENCOUNTER — Other Ambulatory Visit (HOSPITAL_COMMUNITY): Payer: Self-pay | Admitting: Psychiatry

## 2023-03-24 ENCOUNTER — Telehealth: Payer: Self-pay | Admitting: Podiatry

## 2023-03-24 ENCOUNTER — Encounter (HOSPITAL_COMMUNITY): Payer: Self-pay

## 2023-03-24 DIAGNOSIS — F4321 Adjustment disorder with depressed mood: Secondary | ICD-10-CM

## 2023-03-24 DIAGNOSIS — F331 Major depressive disorder, recurrent, moderate: Secondary | ICD-10-CM

## 2023-03-24 DIAGNOSIS — G4733 Obstructive sleep apnea (adult) (pediatric): Secondary | ICD-10-CM | POA: Diagnosis not present

## 2023-03-24 DIAGNOSIS — M0609 Rheumatoid arthritis without rheumatoid factor, multiple sites: Secondary | ICD-10-CM

## 2023-03-24 DIAGNOSIS — F419 Anxiety disorder, unspecified: Secondary | ICD-10-CM

## 2023-03-24 NOTE — Telephone Encounter (Signed)
Pt came by to exchange size, switched out size M for size S.

## 2023-03-24 NOTE — Telephone Encounter (Signed)
Last Fill: 12/27/2022  Eye exam: 01/24/2023   Labs: 02/11/2023 WBC 11.2, EOS  Absolute 0.5, Immature Grans Abs 0.3,   Next Visit: 07/21/2023  Last Visit: 02/18/2023   FA:[id number] arthritis of multiple sites with negative rheumatoid factor   Current Dose per office note 02/18/2023: Plaquenil 200 mg 1 tablet by mouth twice daily Monday through Friday.   Okay to refill Plaquenil?

## 2023-03-24 NOTE — Progress Notes (Signed)
Subjective:   Patient ID: Christina Lewis, female   DOB: 68 y.o.   MRN: [id number]   HPI Patient states that her heels have been very tender and they did not respond to the injection and they are very sore when she tries to get up after cervical as where she gets up in the morning.   ROS      Objective:  Physical Exam  Exquisite discomfort in the plantar heels bilateral with the patient having inability to really bear significant weight on the heels     Assessment:  Acute plantar fasciitis bilateral with patient having inability to stretch the feet     Plan:  H&P reviewed today I went ahead and discussed different treatment options.  Organ to stay away from injections due to the last response I did place her on anti-inflammatory and I went ahead today and I dispensed night splints as I see this is the only way to stretch out her feet keep her immobilized.  We are not able to put her into any kind of boot therapy and I want to see her back again in 3 weeks or earlier if needed

## 2023-03-24 NOTE — Telephone Encounter (Signed)
Patient is stating splint for both feet are too large. Patient contact telephone number, [phone]

## 2023-03-24 NOTE — Telephone Encounter (Signed)
Last Fill: 03/02/2023  Next Visit: 07/21/2023  Last Visit: 12/272024  Dx: Fibromyalgia   Current Dose per office note on 02/18/2023: tizanidine as needed for muscle spasms during flares.   Okay to refill Tizanidine?

## 2023-03-25 ENCOUNTER — Other Ambulatory Visit (HOSPITAL_COMMUNITY): Payer: Self-pay

## 2023-03-25 DIAGNOSIS — F331 Major depressive disorder, recurrent, moderate: Secondary | ICD-10-CM

## 2023-03-25 DIAGNOSIS — F419 Anxiety disorder, unspecified: Secondary | ICD-10-CM

## 2023-03-25 DIAGNOSIS — F4321 Adjustment disorder with depressed mood: Secondary | ICD-10-CM

## 2023-03-25 MED ORDER — SERTRALINE HCL 100 MG PO TABS: 100.0000 mg | ORAL_TABLET | Freq: Every day | ORAL | 0 refills | Status: DC

## 2023-03-25 NOTE — Telephone Encounter (Signed)
Prescription sent to the pharmacy, I called the patient and let her know

## 2023-03-26 ENCOUNTER — Other Ambulatory Visit: Payer: Self-pay | Admitting: Physician Assistant

## 2023-03-28 NOTE — Telephone Encounter (Signed)
Last Fill: 06/04/2022  Next Visit: 07/21/2023  Last Visit: 02/18/2023  Dx: Rheumatoid arthritis of multiple sites with negative rheumatoid factor   Current Dose per office note on 02/18/2023: folic acid 2 mg daily   Okay to refill Folic Acid?

## 2023-03-29 NOTE — Telephone Encounter (Signed)
Spoke with [employer]. They are going to re-fax [place] the application.

## 2023-03-30 NOTE — Telephone Encounter (Signed)
 Never received application/prescription form. I was able to find one online but patient will need to sign it. I called her and LVM (ok per DPR) explaining. I asked for call back or mychart message back. Here is a link to the application:  [email or web address]

## 2023-03-30 NOTE — Progress Notes (Signed)
 Office Visit Note  Patient: Christina Lewis             Date of Birth: 09/11/1955           MRN: [id number]             PCP: Severa Rock HERO, FNP Referring: Severa Rock HERO, FNP Visit Date: 03/31/2023 Occupation: @GUAROCC @  Subjective:  Right sided lower back pain   History of Present Illness: Christina Lewis is a 68 y.o. female with history of rheumatoid arthritis.  Patient remains on Rasuvo  20 mg sq injections once weekly, folic acid  2 mg daily, and Plaquenil  200 mg 1 tablet by mouth twice daily Monday through Friday.  Patient reports that back in the fall she had a gap in therapy for about 1 month and states that she does not feel that her arthritis has been as well-controlled since.  She has not had any recent gaps in therapy.  Patient states for the past 1 week she has been experiencing severe total body pain.  She has been having difficulty sleeping at night and significant pain throughout the day.  Patient is currently taking a prednisone  taper and is on 20 mg daily and will be completing the taper on Sunday.  She is having ongoing pain in her neck, shoulders, both hands, and her right lower back.  Patient has been taking hydrocodone  for pain relief and remains on gabapentin  as prescribed.  She is also been taking baclofen  as needed for muscle spasms.  She presents today for further evaluation and treatment of the discomfort in her lower back.  She denies any recent injury or fall.  Patient states that the right-sided lower back pain is worse when sitting.  She denies any groin pain at this time.  She has had difficulty sleeping at night due to being unable to get comfortable.  She has not noticed any improvement in her back pain while taking prednisone .  Patient remains under the care of Dr. Mavis as well as Dr. Darlis.  She had a lower back injection performed at the end of December 2024 but is not yet ready to proceed with lower back surgery. She is currently under the care of podiatry  for management of plantar fasciitis involving both feet.  She had bilateral plantar fascia cortisone injections with minimal relief which was why she was sent in a prednisone  taper.  She has been trying home exercises as advised.   Activities of Daily Living:  Patient reports morning stiffness for 4-5 hours.   Patient Reports nocturnal pain.  Difficulty dressing/grooming: Denies Difficulty climbing stairs: Reports Difficulty getting out of chair: Reports Difficulty using hands for taps, buttons, cutlery, and/or writing: Reports  Review of Systems  Constitutional:  Positive for fatigue.  HENT:  Positive for mouth dryness. Negative for mouth sores.   Eyes:  Positive for dryness.  Respiratory:  Negative for shortness of breath.   Cardiovascular:  Negative for chest pain and palpitations.  Gastrointestinal:  Negative for blood in stool, constipation and diarrhea.  Endocrine: Negative for increased urination.  Genitourinary:  Negative for involuntary urination.  Musculoskeletal:  Positive for joint pain, joint pain, joint swelling, myalgias, muscle weakness, morning stiffness, muscle tenderness and myalgias. Negative for gait problem.  Skin:  Positive for color change. Negative for rash, hair loss and sensitivity to sunlight.  Allergic/Immunologic: Positive for susceptible to infections.  Neurological:  Positive for headaches. Negative for dizziness.  Hematological:  Negative for swollen glands.  Psychiatric/Behavioral:  Positive for depressed mood and sleep disturbance. The patient is nervous/anxious.     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   Vitamin D  deficiency 10/15/2021    Polycystic ovaries 10/15/2021   Migraine without aura and without status migrainosus, not intractable 10/15/2021   BMI 29.0-29.9,adult 10/15/2021   Rheumatoid arthritis (HCC) 03/04/2021   Type 2 diabetes mellitus with obesity (HCC) 03/04/2021   Right foot infection 03/04/2021   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 myelopathy 09/11/2019   Leukocytosis 11/04/2016   Fibromyalgia 02/10/2016   Other fatigue 02/10/2016   Primary osteoarthritis of both hands 02/10/2016   History of migraine 02/10/2016   History of thyroid  nodule 02/10/2016   History of sleep apnea 02/10/2016   History of renal calculi 02/10/2016   Pain in joint, shoulder region 10/02/2010   Adhesive capsulitis of shoulder 10/02/2010   Muscle weakness (generalized) 10/02/2010    Past Medical History:  Diagnosis Date   Allergy     Anemia    Anxiety    Arthritis    Cataract 2020   Depression    Diabetes mellitus    type 2   Fibromyalgia    GERD (gastroesophageal reflux disease)    Gout    no current problem   Hematuria    History of kidney stones    passed stones and also had surgery to remove one   History of tics    Hyperlipidemia 2007   Ineffective esophageal motility    tx with prilosec, essophageal was stretched   Interstitial cystitis 2016   Migraines    Osteoarthritis    PCOS (polycystic ovarian syndrome) 1993   Pneumonia 2019   PONV (postoperative nausea and vomiting)    Sleep apnea    does not use CPAP   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, took medication for a year   Thyroid  nodule    Dr Bella is monitoring every year    Family History  Problem Relation Age of Onset   Fibromyalgia Mother    Psoriasis Mother        psoriatic arthritis    Diabetes Mother    Heart disease Mother    Arthritis Mother    Asthma Mother    Hyperlipidemia Mother    Hypertension Mother    Stroke Mother    Psoriasis Sister        psoriatic arthritis    Diabetes Sister    Migraines Sister    Rheum  arthritis Sister    Fibromyalgia Sister    Diabetes Sister    Arthritis Sister    Asthma Sister    Diabetes Sister    Hyperlipidemia Sister    Miscarriages / Stillbirths Sister    Arthritis Sister    Asthma Sister    Diabetes Sister    Hyperlipidemia Sister    Hypertension Sister    Miscarriages / Stillbirths Sister    Past Surgical History:  Procedure Laterality Date   ABDOMINAL HYSTERECTOMY  2000   APPENDECTOMY  1976   BALLOON DILATION N/A 11/03/2021   Procedure: MERRILL HODGKIN;  Surgeon: Saintclair Jasper, MD;  Location: [facility];  Service: Gastroenterology;  Laterality: N/A;   BOTOX  INJECTION N/A 11/03/2021   Procedure:  BOTOX  INJECTION;  Surgeon: Saintclair Jasper, MD;  Location: [facility];  Service: Gastroenterology;  Laterality: N/A;   BREAST LUMPECTOMY  1989   lt-negative   CARDIOVASCULAR STRESS TEST  2000   CATARACT EXTRACTION W/PHACO Left 12/07/2019   Procedure: CATARACT EXTRACTION PHACO AND INTRAOCULAR LENS PLACEMENT (IOC);  Surgeon: Harrie Agent, MD;  Location: [facility];  Service: Ophthalmology;  Laterality: Left;  CDE: 5.55   CHOLECYSTECTOMY  1985   COLONOSCOPY     DRUG INDUCED ENDOSCOPY Bilateral 09/29/2022   Procedure: DRUG INDUCED ENDOSCOPY;  Surgeon: Okey Burns, MD;  Location: [facility];  Service: ENT;  Laterality: Bilateral;  15 MINUTES   ESOPHAGEAL MANOMETRY N/A 08/24/2021   Procedure: ESOPHAGEAL MANOMETRY (EM);  Surgeon: Saintclair Jasper, MD;  Location: [facility];  Service: Gastroenterology;  Laterality: N/A;   ESOPHAGOGASTRODUODENOSCOPY N/A 11/03/2021   Procedure: ESOPHAGOGASTRODUODENOSCOPY (EGD);  Surgeon: Saintclair Jasper, MD;  Location: [facility];  Service: Gastroenterology;  Laterality: N/A;   EYE SURGERY  2021   HYSTERECTOMY ABDOMINAL WITH SALPINGECTOMY Bilateral 2000   IMPLANTATION OF HYPOGLOSSAL NERVE STIMULATOR Right 11/24/2022   Procedure: IMPLANTATION OF HYPOGLOSSAL NERVE STIMULATOR;  Surgeon: Okey Burns, MD;  Location: [facility];   Service: ENT;  Laterality: Right;   KIDNEY STONE SURGERY Right 2014   with stent placement in OR   KNEE ARTHROSCOPY Right 08/22/2013   Procedure: ARTHROSCOPY RIGHT KNEE FOR INFECTION LAVAGE AND DRAINAGE;  Surgeon: LELON JONETTA Shari Mickey., MD;  Location: [facility];  Service: Orthopedics;  Laterality: Right;   KNEE ARTHROSCOPY WITH PATELLA RECONSTRUCTION Right 08/06/2013   Procedure: RIGHT KNEE ARTHROSCOPY WITH MENISCECTOMY MEDIAL, ARTHROSCOPY KNEE WITH DEBRIDEMENT/SHAVING (CHONDROPLASTY) ;  Surgeon: LELON JONETTA Shari Mickey., MD;  Location: [facility];  Service: Orthopedics;  Laterality: Right;   LUMBAR LAMINECTOMY/DECOMPRESSION MICRODISCECTOMY Right 09/11/2019   Procedure: Right Lumbar Three-Four Microdiscectomy;  Surgeon: Unice Pac, MD;  Location: [facility];  Service: Neurosurgery;  Laterality: Right;  Right Lumbar Three-Four Microdiscectomy   NASAL SINUS SURGERY     x4   OOPHORECTOMY     SHOULDER SURGERY  2011   right shoulder   SPINE SURGERY  2020   THYROID  SURGERY  1994   TONSILLECTOMY Bilateral 1974   Social History   Social History Narrative   Caffiene 4-5 12 oz cans soda.   Working retired - special ed.   Live with home no kids   Immunization History  Administered Date(s) Administered   Tdap 03/04/2021   Zoster Recombinant(Shingrix ) 10/15/2021     Objective: Vital Signs: BP 122/72 (BP Location: Left Arm, Patient Position: Sitting, Cuff Size: Normal)   Pulse 79   Resp 14   Ht 5' 4 (1.626 m)   Wt 162 lb (73.5 kg)   BMI 27.81 kg/m    Physical Exam Vitals and nursing note reviewed.  Constitutional:      Appearance: She is well-developed.  HENT:     Head: Normocephalic and atraumatic.  Eyes:     Conjunctiva/sclera: Conjunctivae normal.  Cardiovascular:     Rate and Rhythm: Normal rate and regular rhythm.     Heart sounds: Normal heart sounds.  Pulmonary:     Effort: Pulmonary effort is normal.     Breath sounds: Normal breath sounds.  Abdominal:      General: Bowel sounds are normal.     Palpations: Abdomen is soft.  Musculoskeletal:     Cervical back: Normal range of motion.  Lymphadenopathy:     Cervical: No cervical adenopathy.  Skin:    General: Skin is warm and dry.     Capillary Refill: Capillary refill takes less than 2 seconds.  Neurological:     Mental Status: She is alert and oriented to person, place, and time.  Psychiatric:        Behavior: Behavior normal.      Musculoskeletal Exam: Generalized hyperalgesia and positive tender points on exam.  Painful range of motion of the C-spine with lateral rotation.  Painful and limited mobility of the lumbar spine.  Midline spinal tenderness in the lumbar region.  Tenderness of the right SI joint.  Tenderness over the right trochanteric bursa.  Knee joints have good range of motion no warmth or effusion. Ankle joints have good ROM with no tenderness or joint swelling.    CDAI Exam: CDAI Score: -- Patient Global: --; Provider Global: -- Swollen: --; Tender: -- Joint Exam 03/31/2023   No joint exam has been documented for this visit   There is currently no information documented on the homunculus. Go to the Rheumatology activity and complete the homunculus joint exam.  Investigation: No additional findings.  Imaging: XR Pelvis 1-2 Views Result Date: 03/31/2023 No SI joint sclerosis or narrowing was noted.  No significant hip joint narrowing was noted.  No acute pathology was noted.  Severe scoliosis with multilevel spondylosis was noted. Impression: Unremarkable x-rays of the pelvis.  Severe multilevel spondylosis and scoliosis of the lumbar spine.  DG Foot 2 Views Left Result Date: 03/16/2023 Please see detailed radiograph report in office note.   Recent Labs: Lab Results  Component Value Date   WBC 11.2 (H) 02/11/2023   HGB 14.5 02/11/2023   PLT 367 02/11/2023   NA 142 02/11/2023   K 4.1 02/11/2023   CL 105 02/11/2023   CO2 22 02/11/2023   GLUCOSE 83  02/11/2023   BUN 19 02/11/2023   CREATININE 0.79 02/11/2023   BILITOT 0.3 02/11/2023   ALKPHOS 85 02/11/2023   AST 23 02/11/2023   ALT 32 02/11/2023   PROT 6.7 02/11/2023   ALBUMIN 4.6 02/11/2023   CALCIUM 10.0 02/11/2023   GFRAA 98 03/24/2020   QFTBGOLDPLUS NEGATIVE 09/29/2021    Speciality Comments: PLQ Eye Exam: 01/24/2023 WNL @ [facility]     Procedures:  Sacroiliac Joint Inj on 03/31/2023 4:17 PM Indications: pain Details: 27 G 1.5 in needle, posterior approach Medications: 1 mL lidocaine  1 %; 40 mg triamcinolone  acetonide 40 MG/ML Aspirate: 0 mL Outcome: tolerated well, no immediate complications Procedure, treatment alternatives, risks and benefits explained, specific risks discussed. Consent was given by the patient. Immediately prior to procedure a time out was called to verify the correct patient, procedure, equipment, support staff and site/side marked as required. Patient was prepped and draped in the usual sterile fashion.     Allergies: Sulfa antibiotics, Sulfamethoxazole-trimethoprim, Atorvastatin, Codeine, Erythromycin, Metronidazole , Morphine and codeine, and Semaglutide    Assessment / Plan:     Visit Diagnoses: Rheumatoid arthritis of multiple sites with negative rheumatoid factor (HCC): No synovitis was noted on examination today.  She presents today with total body pain consistent with a fibromyalgia flare as well as increased right-sided lower back pain.  Patient has severe arthritis involving her lower back to the point that she require surgical intervention recommended by Dr. Mavis.  She had a recent lower back injection performed by Dr. Darlis with minimal relief.  Due to severity of pain has been experiencing for the past 1 week she has requested a  right SI joint cortisone injection today.  The procedure note was completed above and aftercare was discussed.  She will be following up with Dr. Mavis if her symptoms persist or worsen. For  management of rheumatoid arthritis she remains on Rasuvo  20 mg sq injections once weekly, folic acid  2 mg daily, Plaquenil  200 g 1 tablet by mouth twice daily Monday through Friday.  She is tolerating combination therapy without any side effects and has not had any recent gaps in therapy.  She did In therapy in the fall for about 1 month and has not felt that these medications have been as effective since then.  No active inflammation was noted on examination today.  Plan to check sed rate and CRP with upcoming lab work--future orders were placed today. Recommended discussing with her primary care switching from Zoloft  to Cymbalta  to help better manage her myofascial pain. She will follow up in 3 months or sooner if needed. - Plan: Sedimentation rate, C-reactive protein  High risk medication use - Rasuvo  20 mg sq injections once weekly, folic acid  2 mg daily, and Plaquenil  200 mg 1 tablet by mouth twice daily Monday through Friday. CBC and CMP updated on 02/11/23.  She will return for updated lab work in March and every 3 months to monitor for drug toxicity.  Standing orders for CBC and CMP remain in place. Discussed the importance of holding Rasuvo  if she develops signs or symptoms of an infection and to resume once the infection is completely cleared. PLQ Eye Exam: 01/24/2023 WNL @ [facility].    Primary osteoarthritis of both hands: PIP and DIP thickening noted.  Primary osteoarthritis of right knee: No warmth or effusion noted today.  Primary osteoarthritis of both feet: Good range of motion of both ankle joints noted.  Patient has been under the care of podiatry for management of plantar fasciitis involving both feet.  She has tried bilateral plantar fascia cortisone injections with minimal relief.  She is currently taking a prednisone  taper and is on 20 mg daily.  Other fatigue: Chronic-she has been having difficulty sleeping for the past 1 week due to severity of pain she has been  experiencing so she has had an exacerbation of fatigue as well.  DDD (degenerative disc disease), cervical - MRI of the cervical spine revealed mild cervical spondylosis.  Mild left foraminal narrowing at C3-C4 and C5-C6 noted.  Followed by Dr. Mavis.  Limited range of motion with lateral rotation.  She is currently experiencing increased pain and stiffness in the cervical spine.  She has trapezius muscle tension and tenderness bilaterally.  Degeneration of intervertebral disc of lumbar region with discogenic back pain or lower extremity pain - Under care of Dr. Mavis and Dr. Darlis.  Patient had a recent lower back injection performed which provided minimal relief.  She is not yet ready to proceed with surgical intervention.  She is taking hydrocodone , gabapentin , and prednisone  with minimal relief.  On examination today she has tenderness over the right SI joint.  Due to severity of her pain she has requested a right SI joint cortisone injection today.  She tolerated procedure well.  Procedure note was completed above.  Aftercare was discussed.  Patient was advised to follow-up with Dr. Mavis if her symptoms persist or worsen.  Chronic right SI joint pain - She presents today with increased right-sided lower back pain.  No recent injury or fall.  X-rays of the pelvis were obtained today for further evaluation: Unremarkable  x-rays of the pelvis.  Severe multilevel spondylosis and scoliosis of the lumbar spine.   After informed consent the right SI joint was injected with cortisone as requested.  She tolerated the procedure well.  Procedure note was completed above.  Aftercare was discussed.  Plan: XR Pelvis 1-2 Views  Trochanteric bursitis of right hip: She has tenderness over the right trochanteric bursa on examination today.  She has been experiencing increased pain at night especially if she tries to lay on her side.  Encourage patient to perform stretching exercises daily.  Fibromyalgia: She  has generally several adjacent and positive tender points on exam.  She has been experiencing total body pain for the past 1 week.  She is currently experiencing a fibromyalgia flare.  She remains on gabapentin  and has been taking hydrocodone  for pain relief.  Discussed that she may benefit from switching from Zoloft  to Cymbalta  to help alleviate some of her myofascial pain.  Patient plans on further discussing with her PCP.  Itching of both hands: No visible rash noted today.  Palmar erythema noted.  She has tried several topical agents and antihistamines with no relief.  A referral to dermatology will be placed today as requested.   Other medical conditions are listed as follows:  Family history of rheumatoid arthritis-Sister  Family history of psoriatic arthritis-mother and sister  History of tics  History of diabetes mellitus: Patient was advised to monitor blood glucose closely following a cortisone injection today.  History of renal calculi  History of sleep apnea  History of thyroid  nodule  History of gastroesophageal reflux (GERD)  History of depression  History of cellulitis  History of positive PPD  History of migraine  History of PCOS   Orders: Orders Placed This Encounter  Procedures   Sacroiliac Joint Inj   XR Pelvis 1-2 Views   Sedimentation rate   C-reactive protein   No orders of the defined types were placed in this encounter.    Follow-Up Instructions: Return in about 3 months (around 06/28/2023) for Rheumatoid arthritis, Osteoarthritis.   Waddell CHRISTELLA Craze, PA-C  Note - This record has been created using Dragon software.  Chart creation errors have been sought, but may not always  have been located. Such creation errors do not reflect on  the standard of medical care.

## 2023-03-31 ENCOUNTER — Ambulatory Visit: Payer: HMO

## 2023-03-31 ENCOUNTER — Encounter (HOSPITAL_COMMUNITY): Payer: Self-pay

## 2023-03-31 ENCOUNTER — Ambulatory Visit: Payer: HMO | Attending: Physician Assistant | Admitting: Physician Assistant

## 2023-03-31 ENCOUNTER — Encounter: Payer: Self-pay | Admitting: Physician Assistant

## 2023-03-31 VITALS — BP 122/72 | HR 79 | Resp 14 | Ht 64.0 in | Wt 162.0 lb

## 2023-03-31 DIAGNOSIS — Z8742 Personal history of other diseases of the female genital tract: Secondary | ICD-10-CM

## 2023-03-31 DIAGNOSIS — Z8719 Personal history of other diseases of the digestive system: Secondary | ICD-10-CM

## 2023-03-31 DIAGNOSIS — M797 Fibromyalgia: Secondary | ICD-10-CM | POA: Diagnosis not present

## 2023-03-31 DIAGNOSIS — Z840 Family history of diseases of the skin and subcutaneous tissue: Secondary | ICD-10-CM

## 2023-03-31 DIAGNOSIS — Z8261 Family history of arthritis: Secondary | ICD-10-CM

## 2023-03-31 DIAGNOSIS — G8929 Other chronic pain: Secondary | ICD-10-CM

## 2023-03-31 DIAGNOSIS — M0609 Rheumatoid arthritis without rheumatoid factor, multiple sites: Secondary | ICD-10-CM

## 2023-03-31 DIAGNOSIS — Z872 Personal history of diseases of the skin and subcutaneous tissue: Secondary | ICD-10-CM

## 2023-03-31 DIAGNOSIS — M1711 Unilateral primary osteoarthritis, right knee: Secondary | ICD-10-CM | POA: Diagnosis not present

## 2023-03-31 DIAGNOSIS — M19071 Primary osteoarthritis, right ankle and foot: Secondary | ICD-10-CM

## 2023-03-31 DIAGNOSIS — F331 Major depressive disorder, recurrent, moderate: Secondary | ICD-10-CM | POA: Diagnosis not present

## 2023-03-31 DIAGNOSIS — M19041 Primary osteoarthritis, right hand: Secondary | ICD-10-CM | POA: Diagnosis not present

## 2023-03-31 DIAGNOSIS — Z8669 Personal history of other diseases of the nervous system and sense organs: Secondary | ICD-10-CM

## 2023-03-31 DIAGNOSIS — Z79899 Other long term (current) drug therapy: Secondary | ICD-10-CM | POA: Diagnosis not present

## 2023-03-31 DIAGNOSIS — M5030 Other cervical disc degeneration, unspecified cervical region: Secondary | ICD-10-CM

## 2023-03-31 DIAGNOSIS — Z87442 Personal history of urinary calculi: Secondary | ICD-10-CM

## 2023-03-31 DIAGNOSIS — M51369 Other intervertebral disc degeneration, lumbar region without mention of lumbar back pain or lower extremity pain: Secondary | ICD-10-CM

## 2023-03-31 DIAGNOSIS — Z9289 Personal history of other medical treatment: Secondary | ICD-10-CM

## 2023-03-31 DIAGNOSIS — Z8639 Personal history of other endocrine, nutritional and metabolic disease: Secondary | ICD-10-CM

## 2023-03-31 DIAGNOSIS — M51362 Other intervertebral disc degeneration, lumbar region with discogenic back pain and lower extremity pain: Secondary | ICD-10-CM

## 2023-03-31 DIAGNOSIS — F411 Generalized anxiety disorder: Secondary | ICD-10-CM | POA: Diagnosis not present

## 2023-03-31 DIAGNOSIS — Z8659 Personal history of other mental and behavioral disorders: Secondary | ICD-10-CM | POA: Diagnosis not present

## 2023-03-31 DIAGNOSIS — M7061 Trochanteric bursitis, right hip: Secondary | ICD-10-CM

## 2023-03-31 DIAGNOSIS — M533 Sacrococcygeal disorders, not elsewhere classified: Secondary | ICD-10-CM

## 2023-03-31 DIAGNOSIS — R5383 Other fatigue: Secondary | ICD-10-CM | POA: Diagnosis not present

## 2023-03-31 DIAGNOSIS — M19072 Primary osteoarthritis, left ankle and foot: Secondary | ICD-10-CM

## 2023-03-31 DIAGNOSIS — M19042 Primary osteoarthritis, left hand: Secondary | ICD-10-CM

## 2023-03-31 DIAGNOSIS — L299 Pruritus, unspecified: Secondary | ICD-10-CM

## 2023-03-31 MED ORDER — TRIAMCINOLONE ACETONIDE 40 MG/ML IJ SUSP
40.0000 mg | INTRAMUSCULAR | Status: AC | PRN
  Administered 2023-03-31: 40 mg via INTRA_ARTICULAR

## 2023-03-31 MED ORDER — LIDOCAINE HCL 1 % IJ SOLN
1.0000 mL | INTRAMUSCULAR | Status: AC | PRN
  Administered 2023-03-31: 1 mL

## 2023-04-01 ENCOUNTER — Ambulatory Visit (INDEPENDENT_AMBULATORY_CARE_PROVIDER_SITE_OTHER): Payer: Medicare PPO | Admitting: Otolaryngology

## 2023-04-01 NOTE — Addendum Note (Signed)
 Addended by: Glori Lard C on: 04/01/2023 08:00 AM   Modules accepted: Orders

## 2023-04-06 ENCOUNTER — Ambulatory Visit: Payer: PPO | Admitting: Podiatry

## 2023-04-06 NOTE — Telephone Encounter (Addendum)
Emailed sheet that is missing pt signature to pt at [email or web address]

## 2023-04-11 ENCOUNTER — Telehealth: Payer: Self-pay

## 2023-04-11 ENCOUNTER — Telehealth: Payer: Self-pay | Admitting: Rheumatology

## 2023-04-11 NOTE — Telephone Encounter (Signed)
I called patient, LMOM at [facility], faxed referral, pending appt

## 2023-04-11 NOTE — Telephone Encounter (Signed)
Patient contacted the office and states she was referred to [facility] and the soonest they could get her in was 11/15/2023. Patient inquired if we can refer her to somewhere else to get her in sooner. Advised the patient to call around to some Dermatology offices she could go to to see if any have sooner new patient appointments. Advised the patient to call and let us know if she finds another Dermatologist and we can change the referral. Patient verbalized understanding.

## 2023-04-11 NOTE — Telephone Encounter (Signed)
Pt states she called [facility] dermatology and they stated they do not have a referral for her. I stated to the pt that we did have it put in under her chart. Pt would like another one sent.

## 2023-04-12 ENCOUNTER — Other Ambulatory Visit: Payer: Self-pay | Admitting: Physician Assistant

## 2023-04-12 NOTE — Telephone Encounter (Signed)
Referral changed and faxed, patient advised to call [facility] to cancel appt, pending appt.

## 2023-04-12 NOTE — Telephone Encounter (Signed)
Last Fill: 03/07/2023  Next Visit: 07/21/2023  Last Visit: 03/31/2023  Dx: Fibromyalgia   Current Dose per office note on 03/31/2023: not discussed  Okay to refill Baclofen?

## 2023-04-12 NOTE — Telephone Encounter (Signed)
Patient returned call to the office. Patient states after calling around she was able to find a dermatology office which can schedule her for April. Patient request the referral to be changed to Elyse Hsu with [facility].

## 2023-04-13 ENCOUNTER — Other Ambulatory Visit: Payer: Self-pay | Admitting: Physician Assistant

## 2023-04-13 ENCOUNTER — Ambulatory Visit: Payer: PPO | Admitting: Podiatry

## 2023-04-14 ENCOUNTER — Other Ambulatory Visit: Payer: Self-pay | Admitting: *Deleted

## 2023-04-14 NOTE — Telephone Encounter (Signed)
Patient contacted the office stating she now has a new insurance. Patient states her new insurance will not cover her Rasuvo. Patient states they will cover the Methotrexate vial and syringe. Patient would like to know if we can switch her to this and if so she will need a prescription sent to the [facility]. Please advise.

## 2023-04-15 MED ORDER — METHOTREXATE SODIUM CHEMO INJECTION 50 MG/2ML: 20.0000 mg | INTRAMUSCULAR | 0 refills | Status: DC

## 2023-04-15 MED ORDER — BD TB SYRINGE 27G X 1/2" 1 ML MISC: 12.0000 | 3 refills | Status: DC

## 2023-04-15 NOTE — Addendum Note (Signed)
Addended by: Henriette Combs on: 04/15/2023 12:29 PM   Modules accepted: Orders

## 2023-04-15 NOTE — Telephone Encounter (Signed)
 Yes

## 2023-04-15 NOTE — Telephone Encounter (Signed)
Patient advised we are sending in a prescription for the MTX vial and syringe.

## 2023-04-15 NOTE — Telephone Encounter (Signed)
Rasuvo 20 mg sq injections once weekly

## 2023-04-16 DIAGNOSIS — M79671 Pain in right foot: Secondary | ICD-10-CM | POA: Diagnosis not present

## 2023-04-16 DIAGNOSIS — M722 Plantar fascial fibromatosis: Secondary | ICD-10-CM | POA: Diagnosis not present

## 2023-04-18 ENCOUNTER — Telehealth: Payer: Self-pay

## 2023-04-18 ENCOUNTER — Telehealth (HOSPITAL_COMMUNITY): Payer: Medicare PPO | Admitting: Psychiatry

## 2023-04-18 NOTE — Telephone Encounter (Signed)
 See prev note

## 2023-04-18 NOTE — Telephone Encounter (Signed)
 Patient called and requested for Budesonide 0.6mg /ml to be sent in [facility] [phone]

## 2023-04-19 ENCOUNTER — Telehealth (INDEPENDENT_AMBULATORY_CARE_PROVIDER_SITE_OTHER): Payer: Self-pay | Admitting: Otolaryngology

## 2023-04-19 ENCOUNTER — Other Ambulatory Visit (INDEPENDENT_AMBULATORY_CARE_PROVIDER_SITE_OTHER): Payer: Self-pay | Admitting: Otolaryngology

## 2023-04-19 ENCOUNTER — Telehealth (INDEPENDENT_AMBULATORY_CARE_PROVIDER_SITE_OTHER): Payer: Self-pay

## 2023-04-19 MED ORDER — BUDESONIDE 0.5 MG/2ML IN SUSP: 0.5000 mg | Freq: Two times a day (BID) | RESPIRATORY_TRACT | 12 refills | Status: DC

## 2023-04-19 NOTE — Progress Notes (Signed)
 Refill for budesonide carpules for nasal rinse

## 2023-04-19 NOTE — Telephone Encounter (Signed)
 [facility] pharmacy [phone] , pt has not filled this prescription at this pharm before, they need to speak with clinical staff before they can fill

## 2023-04-19 NOTE — Telephone Encounter (Signed)
 Contacted [facility] to get the patient her budesonide nasal rinse, per the pharmacy they have never done this dosage before and are not sure of all the directions,  I have left a message for the patient as we normally send this to a pharmacy in [place] and they mail it to the patient,  if this would be easier for her or does she still want to try [employer]

## 2023-04-19 NOTE — Telephone Encounter (Signed)
 Left message stating that Dr Irene Pap sent in the script for her nasal rinse meds to [facility] and to let us know if there were any further questions

## 2023-04-20 ENCOUNTER — Telehealth: Payer: Self-pay

## 2023-04-20 NOTE — Telephone Encounter (Signed)
 Christina Lewis, pharmacy manager left message on Plastic Surgery voicemail requesting clarification on an Rx for budesonide that was called in and electronically sent. He stated both scripts have two different strengths on it. Requesting a call back @ [phone].   Thanks

## 2023-04-21 ENCOUNTER — Encounter (HOSPITAL_COMMUNITY): Payer: Self-pay | Admitting: Psychiatry

## 2023-04-21 ENCOUNTER — Other Ambulatory Visit: Payer: Self-pay

## 2023-04-21 ENCOUNTER — Ambulatory Visit (HOSPITAL_BASED_OUTPATIENT_CLINIC_OR_DEPARTMENT_OTHER): Payer: HMO | Admitting: Psychiatry

## 2023-04-21 VITALS — BP 122/76 | HR 87 | Ht 64.0 in | Wt 163.0 lb

## 2023-04-21 DIAGNOSIS — F4321 Adjustment disorder with depressed mood: Secondary | ICD-10-CM

## 2023-04-21 DIAGNOSIS — F419 Anxiety disorder, unspecified: Secondary | ICD-10-CM | POA: Diagnosis not present

## 2023-04-21 DIAGNOSIS — F331 Major depressive disorder, recurrent, moderate: Secondary | ICD-10-CM | POA: Diagnosis not present

## 2023-04-21 MED ORDER — DULOXETINE HCL 30 MG PO CPEP: ORAL_CAPSULE | ORAL | 1 refills | Status: DC

## 2023-04-21 NOTE — Progress Notes (Signed)
 [facility] MD/PA/NP OP Progress Note  Provider Location: Office Patient Location: Office  04/21/2023 3:26 PM Christina Lewis  MRN:  [id number]  Chief Complaint:  Chief Complaint  Patient presents with   Follow-up   Depression   Medication Refill   HPI: Patient came today for her follow-up appointment.  On the last visit we increased Zoloft but she did not see any improvement.  She admitted a lot of frustration, irritability when she goes to her house.  Patient told since husband deceased last year she has not back to her house and living with her sister and brother-in-law.  Patient told house is destroyed by the cats and not in a livable condition.  She mention house requires a lot of things and she wants to fix it but she has no desire or motivation to do things.  She admitted a lot of irritability, anger, severe mood swings.  In the past she believe her husband puts up her anger and frustration but now she has no outlet.  She does not want to raise her voice at her sisters place.  Her plan is to fix the house and sell it because she does not want to live in the same house.  She reported poor sleep, easily tearful, crying, irritability and severe anxiety.  She also reported financial issues as husband was working and since he deceased she did not have stable income.  Recently she had a visit with her rheumatologist and discussed about Cymbalta.  She had tried Cymbalta moderate dose in the past but it was stopped after it was ineffective.  Patient like to go back on Cymbalta again.  Patient also have a lot of other health issues including chronic back pain and she is wondering if Cymbalta can help her back pain.  She is taking pain medications.  She denies any hallucination or any paranoia.  She denies any suicidal thoughts.  She started therapy with Aundra Millet in person and that has some help.  Her appetite is okay.  Her weight is stable.  Visit Diagnosis:    ICD-10-CM   1. MDD (major depressive disorder),  recurrent episode, moderate (HCC)  F33.1 DULoxetine (CYMBALTA) 30 MG capsule    2. Grief  F43.21 DULoxetine (CYMBALTA) 30 MG capsule    3. Anxiety  F41.9 DULoxetine (CYMBALTA) 30 MG capsule      Past Psychiatric History: Reviewed H/O depression since 63. Took Prozac and Lexapro by PCP.  Saw Dr Evelene Croon in 2012 and prescribed Cymbalta.  Dose increased to 120 mg and later doxepin was added.  They were discontinued after found ineffective.  We tried Wellbutrin but had side effects.  Took gabapentin to help neuropathy but ineffective.  No history of suicidal attempt, inpatient treatment, psychosis, mania, abuse, legal issues, seizures, TBI.   Past Medical History:  Past Medical History:  Diagnosis Date   Allergy    Anemia    Anxiety    Arthritis    Cataract 2020   Depression    Diabetes mellitus    type 2   Fibromyalgia    GERD (gastroesophageal reflux disease)    Gout    no current problem   Hematuria    History of kidney stones    passed stones and also had surgery to remove one   History of tics    Hyperlipidemia 2007   Ineffective esophageal motility    tx with prilosec, essophageal was stretched   Interstitial cystitis 2016   Migraines    Osteoarthritis  PCOS (polycystic ovarian syndrome) 1993   Pneumonia 2019   PONV (postoperative nausea and vomiting)    Sleep apnea    does not use CPAP   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, took medication for a year   Thyroid nodule    Dr Horald Pollen is monitoring every year    Past Surgical History:  Procedure Laterality Date   ABDOMINAL HYSTERECTOMY  2000   APPENDECTOMY  1976   BALLOON DILATION N/A 11/03/2021   Procedure: Marvis Repress DILATION;  Surgeon: Kerin Salen, MD;  Location: [facility];  Service: Gastroenterology;  Laterality: N/A;   BOTOX INJECTION N/A 11/03/2021   Procedure: BOTOX INJECTION;  Surgeon: Kerin Salen, MD;  Location: [facility];  Service: Gastroenterology;  Laterality: N/A;   BREAST  LUMPECTOMY  1989   lt-negative   CARDIOVASCULAR STRESS TEST  2000   CATARACT EXTRACTION W/PHACO Left 12/07/2019   Procedure: CATARACT EXTRACTION PHACO AND INTRAOCULAR LENS PLACEMENT (IOC);  Surgeon: [name], MD;  Location: [facility];  Service: Ophthalmology;  Laterality: Left;  CDE: 5.55   CHOLECYSTECTOMY  1985   COLONOSCOPY     DRUG INDUCED ENDOSCOPY Bilateral 09/29/2022   Procedure: DRUG INDUCED ENDOSCOPY;  Surgeon: Ashok Croon, MD;  Location: [facility];  Service: ENT;  Laterality: Bilateral;  15 MINUTES   ESOPHAGEAL MANOMETRY N/A 08/24/2021   Procedure: ESOPHAGEAL MANOMETRY (EM);  Surgeon: Kerin Salen, MD;  Location: [facility];  Service: Gastroenterology;  Laterality: N/A;   ESOPHAGOGASTRODUODENOSCOPY N/A 11/03/2021   Procedure: ESOPHAGOGASTRODUODENOSCOPY (EGD);  Surgeon: Kerin Salen, MD;  Location: [facility];  Service: Gastroenterology;  Laterality: N/A;   EYE SURGERY  2021   HYSTERECTOMY ABDOMINAL WITH SALPINGECTOMY Bilateral 2000   IMPLANTATION OF HYPOGLOSSAL NERVE STIMULATOR Right 11/24/2022   Procedure: IMPLANTATION OF HYPOGLOSSAL NERVE STIMULATOR;  Surgeon: Ashok Croon, MD;  Location: [facility];  Service: ENT;  Laterality: Right;   KIDNEY STONE SURGERY Right 2014   with stent placement in OR   KNEE ARTHROSCOPY Right 08/22/2013   Procedure: ARTHROSCOPY RIGHT KNEE FOR INFECTION LAVAGE AND DRAINAGE;  Surgeon: Thera Flake., MD;  Location: [facility];  Service: Orthopedics;  Laterality: Right;   KNEE ARTHROSCOPY WITH PATELLA RECONSTRUCTION Right 08/06/2013   Procedure: RIGHT KNEE ARTHROSCOPY WITH MENISCECTOMY MEDIAL, ARTHROSCOPY KNEE WITH DEBRIDEMENT/SHAVING (CHONDROPLASTY) ;  Surgeon: Thera Flake., MD;  Location: [facility];  Service: Orthopedics;  Laterality: Right;   LUMBAR LAMINECTOMY/DECOMPRESSION MICRODISCECTOMY Right 09/11/2019   Procedure: Right Lumbar Three-Four Microdiscectomy;  Surgeon: Maeola Harman, MD;   [facility];  Service: Neurosurgery;  Laterality: Right;  Right Lumbar Three-Four Microdiscectomy   NASAL SINUS SURGERY     x4   OOPHORECTOMY     SHOULDER SURGERY  2011   right shoulder   SPINE SURGERY  2020   THYROID SURGERY  1994   TONSILLECTOMY Bilateral 1974    Family Psychiatric History: Reviewed  Family History:  Family History  Problem Relation Age of Onset   Fibromyalgia Mother    Psoriasis Mother        psoriatic arthritis    Diabetes Mother    Heart disease Mother    Arthritis Mother    Asthma Mother    Hyperlipidemia Mother    Hypertension Mother    Stroke Mother    Psoriasis Sister        psoriatic arthritis    Diabetes Sister    Migraines Sister    Rheum arthritis Sister  Fibromyalgia Sister    Diabetes Sister    Arthritis Sister    Asthma Sister    Diabetes Sister    Hyperlipidemia Sister    Miscarriages / [place] Sister    Arthritis Sister    Asthma Sister    Diabetes Sister    Hyperlipidemia Sister    Hypertension Sister    Miscarriages / [place] Sister     Social History:  Social History   Socioeconomic History   Marital status: Widowed    Spouse name: Chrissie Noa   Number of children: Not on file   Years of education: Not on file   Highest education level: Bachelor's degree (e.g., BA, AB, BS)  Occupational History   Occupation: retired  Tobacco Use   Smoking status: Never    Passive exposure: Past   Smokeless tobacco: Never  Vaping Use   Vaping status: Never Used  Substance and Sexual Activity   Alcohol use: Never   Drug use: Never   Sexual activity: Not Currently    Birth control/protection: Post-menopausal    Comment: Hysterectomy  Other Topics Concern   Not on file  Social History Narrative   Caffiene 4-5 12 oz cans soda.   Working retired - special ed.   Live with home no kids   Social Drivers of Health   Financial Resource Strain: Low Risk  (02/18/2023)   Overall Financial Resource Strain (CARDIA)     Difficulty of Paying Living Expenses: Not very hard  Food Insecurity: No Food Insecurity (02/18/2023)   Hunger Vital Sign    Worried About Running Out of Food in the Last Year: Never true    Ran Out of Food in the Last Year: Never true  Transportation Needs: No Transportation Needs (02/18/2023)   PRAPARE - Administrator, Civil Service (Medical): No    Lack of Transportation (Non-Medical): No  Physical Activity: Inactive (02/18/2023)   Exercise Vital Sign    Days of Exercise per Week: 0 days    Minutes of Exercise per Session: 0 min  Stress: Stress Concern Present (02/18/2023)   [employer] of Occupational Health - Occupational Stress Questionnaire    Feeling of Stress : To some extent  Social Connections: Moderately Isolated (02/18/2023)   Social Connection and Isolation Panel [NHANES]    Frequency of Communication with Friends and Family: More than three times a week    Frequency of Social Gatherings with Friends and Family: Three times a week    Attends Religious Services: 1 to 4 times per year    Active Member of Clubs or Organizations: No    Attends Banker Meetings: Never    Marital Status: Widowed    Allergies:  Allergies  Allergen Reactions   Sulfa Antibiotics Anaphylaxis   Sulfamethoxazole-Trimethoprim Anaphylaxis   Atorvastatin Other (See Comments)    Severe Muscle and Joint pain with ALL statins    Codeine Itching   Erythromycin Nausea And Vomiting   Metronidazole Diarrhea   Morphine And Codeine Itching   Semaglutide Nausea And Vomiting    Ozempic*    Metabolic Disorder Labs: Lab Results  Component Value Date   HGBA1C 5.9 (H) 02/11/2023   MPG 145.59 03/04/2021   MPG 116.89 09/07/2019   No results found for: "PROLACTIN" Lab Results  Component Value Date   CHOL 319 (H) 02/11/2023   TRIG 432 (H) 02/11/2023   HDL 50 02/11/2023   CHOLHDL 6.4 (H) 02/11/2023   LDLCALC 182 (H) 02/11/2023   LDLCALC 153 (  H) 10/15/2021   Lab  Results  Component Value Date   TSH 0.762 02/11/2023   TSH 1.220 10/15/2021    Therapeutic Level Labs: No results found for: "LITHIUM" No results found for: "VALPROATE" No results found for: "CBMZ"  Current Medications: Current Outpatient Medications  Medication Sig Dispense Refill   baclofen (LIORESAL) 10 MG tablet TAKE 1 TABLET BY MOUTH EACH MORNING AND 1 TABLET AT NOON. 60 tablet 0   betamethasone dipropionate (DIPROLENE) 0.05 % ointment Apply topically 2 (two) times daily. 15 g 0   budesonide (PULMICORT) 0.5 MG/2ML nebulizer solution Take 2 mLs (0.5 mg total) by nebulization in the morning and at bedtime. MIX 1 VIAL WITH 250CC OF NORMAL SALINE FOR SINUS WASH DAILY AS NEEDED 120 mL 12   cholecalciferol (VITAMIN D) 25 MCG (1000 UNIT) tablet Take 1,000 Units by mouth daily.     Eptinezumab-jjmr (VYEPTI) 100 MG/ML injection Inject into the vein every 3 (three) months.     folic acid (FOLVITE) 1 MG tablet TAKE TWO TABLETS BY MOUTH EVERY DAY 180 tablet 3   gabapentin (NEURONTIN) 300 MG capsule Take 300 mg by mouth 3 (three) times daily.     HYDROcodone-acetaminophen (NORCO/VICODIN) 5-325 MG tablet Take by mouth.     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 120 tablet 0   ibuprofen (ADVIL) 200 MG tablet Take 400 mg by mouth every 8 (eight) hours as needed for moderate pain.     JARDIANCE 10 MG TABS tablet Take 10 mg by mouth daily.     Lancets (ONETOUCH DELICA PLUS LANCET33G) MISC SMARTSIG:1 Topical Daily     MAGNESIUM PO Take 500 mg by mouth at bedtime.     methotrexate 50 MG/2ML injection Inject 0.8 mLs (20 mg total) into the skin once a week. 10 mL 0   Multiple Vitamin (MULTIVITAMIN WITH MINERALS) TABS tablet Take 1 tablet by mouth daily.     NASAL WASH NA Place into the nose. Cortisone and Saline     omeprazole (PRILOSEC) 20 MG capsule Take 40 mg by mouth daily.     ondansetron (ZOFRAN-ODT) 8 MG disintegrating  tablet Take 8 mg by mouth every 8 (eight) hours as needed for nausea.      ONETOUCH VERIO test strip 1 each daily.     Polyvinyl Alcohol-Povidone (REFRESH OP) Apply to eye as needed.     Rimegepant Sulfate (NURTEC) 75 MG TBDP Take 1 tablet (75 mg total) by mouth daily as needed. For migraines. Take as close to onset of migraine as possible. One daily maximum. 10 tablet 5   sertraline (ZOLOFT) 100 MG tablet Take 1 tablet (100 mg total) by mouth daily. 30 tablet 0   tiZANidine (ZANAFLEX) 4 MG tablet TAKE ONE TABLET BY MOUTH AT BEDTIME AS NEEDED 30 tablet 0   TUBERCULIN SYR 1CC/27GX1/2" (B-D TB SYRINGE 1CC/27GX1/2") 27G X 1/2" 1 ML MISC 12 Syringes by Does not apply route once a week. 12 each 3   desloratadine (CLARINEX) 5 MG tablet Take 1 tablet (5 mg total) by mouth daily. (Patient not taking: Reported on 03/31/2023) 90 tablet 3   ferrous sulfate 325 (65 FE) MG tablet Take 325 mg by mouth daily.     Methotrexate, PF, (RASUVO) 20 MG/0.4ML SOAJ Inject 20 mg into the skin once a week. 4.8 mL 0   predniSONE (DELTASONE) 10 MG tablet 12 day tapering dose 48 tablet 0   No current facility-administered medications for  this visit.     Musculoskeletal: Strength & Muscle Tone: within normal limits Gait & Station: normal Patient leans: N/A  Psychiatric Specialty Exam: Review of Systems  Blood pressure 122/76, pulse 87, height 5\' 4"  (1.626 m), weight 163 lb (73.9 kg).Body mass index is 27.98 kg/m.  General Appearance: Casual  Eye Contact:  Fair  Speech:  Slow  Volume:  Decreased  Mood:  Depressed, Dysphoric, Irritable, and tearful  Affect:  Constricted and Depressed  Thought Process:  Goal Directed  Orientation:  Full (Time, Place, and Person)  Thought Content: Rumination   Suicidal Thoughts:  No  Homicidal Thoughts:  No  Memory:  Immediate;   Good Recent;   Good Remote;   Fair  Judgement:  Intact  Insight:  Present  Psychomotor Activity:  Decreased  Concentration:  Concentration: Fair and  Attention Span: Fair  Recall:  [employer] of Knowledge: Fair  Language: Fair  Akathisia:  No  Handed:  Right  AIMS (if indicated): not done  Assets:  Communication Skills Desire for Improvement Housing Transportation  ADL's:  Intact  Cognition: WNL  Sleep:  Fair   Screenings: GAD-7    Flowsheet Row Office Visit from 02/11/2023 in [facility] Office Visit from 09/01/2022 in [facility] Office Visit from 03/09/2022 in [facility] Office Visit from 10/15/2021 in [facility]  Total GAD-7 Score 8 9 2 4       PHQ2-9    Flowsheet Row Office Visit from 04/21/2023 in [facility] Clinical Support from 02/18/2023 in [facility] Office Visit from 02[facility]11/2023 in [facility] Office Visit from 09/01/2022 in [facility] Office Visit from 03/09/2022 in [facility]  PHQ-2 Total Score 4 4 4 6 2   PHQ-9 Total Score 8 14 14 17 7       Flowsheet Row ED from 02/14/2023 in [facility] Admission (Discharged) from 11/24/2022 in [place] [facility] Pre-Admission Testing 60 from 11/17/2022 in [facility] PREADMISSION TESTING  C-SSRS RISK CATEGORY No Risk No Risk No Risk        Assessment and Plan: Patient is 68 year old Caucasian widowed female with multiple health issues came today for her follow-up appointment.  She do not feel any improvement with increase Zoloft.  She like to go back on Cymbalta which she took in the past and thought it did work until stopped working.  She also have chronic pain and that might be helpful with Cymbalta.  In the past she has taken Cymbalta 120 mg.  Recommend to start Cymbalta 30 mg daily for 1 week and then  twice a day.  Cut down the Zoloft 50 mg and then discontinued.  Encourage to continue therapy with Aundra Millet about grief and coping skills.  Reminded medication side effects and benefits.  Recommend to call us back if she is any question or any concern.  Will follow-up in 6 weeks.  Collaboration of Care: Collaboration of Care: Other provider involved in patient's care AEB notes are available in epic to review Hello  Patient/Guardian was advised Release of Information must be obtained prior to any record release in order to collaborate their care with an outside provider. Patient/Guardian was advised if they have not already done so to contact the registration department to sign all necessary forms in order  for [place] to release information regarding their care.   Consent: Patient/Guardian gives verbal consent for treatment and assignment of benefits for services provided during this visit. Patient/Guardian expressed understanding and agreed to proceed.   I provided 31 minutes face-to-face time during this encounter.  Cleotis Nipper, MD 04/21/2023, 3:26 PM

## 2023-04-25 DIAGNOSIS — M216X1 Other acquired deformities of right foot: Secondary | ICD-10-CM | POA: Diagnosis not present

## 2023-04-25 DIAGNOSIS — M25571 Pain in right ankle and joints of right foot: Secondary | ICD-10-CM | POA: Diagnosis not present

## 2023-04-25 DIAGNOSIS — M67961 Unspecified disorder of synovium and tendon, right lower leg: Secondary | ICD-10-CM | POA: Diagnosis not present

## 2023-04-28 ENCOUNTER — Other Ambulatory Visit: Payer: Self-pay | Admitting: *Deleted

## 2023-04-28 DIAGNOSIS — M0609 Rheumatoid arthritis without rheumatoid factor, multiple sites: Secondary | ICD-10-CM

## 2023-04-28 DIAGNOSIS — G4733 Obstructive sleep apnea (adult) (pediatric): Secondary | ICD-10-CM | POA: Diagnosis not present

## 2023-04-28 DIAGNOSIS — Z79899 Other long term (current) drug therapy: Secondary | ICD-10-CM

## 2023-05-02 ENCOUNTER — Other Ambulatory Visit: Payer: Self-pay | Admitting: Physician Assistant

## 2023-05-02 NOTE — Telephone Encounter (Signed)
 Last Fill: 03/24/2023  Next Visit: 07/21/2023  Last Visit: 03/31/2023  Dx: Rheumatoid arthritis of multiple sites with negative rheumatoid factor   Current Dose per office note on 03/31/2023: not mentioned  Okay to refill Tizanidine?

## 2023-05-03 DIAGNOSIS — Z79899 Other long term (current) drug therapy: Secondary | ICD-10-CM | POA: Diagnosis not present

## 2023-05-03 DIAGNOSIS — F331 Major depressive disorder, recurrent, moderate: Secondary | ICD-10-CM | POA: Diagnosis not present

## 2023-05-03 DIAGNOSIS — F411 Generalized anxiety disorder: Secondary | ICD-10-CM | POA: Diagnosis not present

## 2023-05-03 DIAGNOSIS — M0609 Rheumatoid arthritis without rheumatoid factor, multiple sites: Secondary | ICD-10-CM | POA: Diagnosis not present

## 2023-05-03 NOTE — Progress Notes (Signed)
 WBC count is elevated-12.0.  absolute neutrophils are elevated.  Please clarify if she has had a recent infection or steroid use?  Platelet count is elevated-417K.  Rest of CBC WNL.

## 2023-05-04 DIAGNOSIS — M25571 Pain in right ankle and joints of right foot: Secondary | ICD-10-CM | POA: Diagnosis not present

## 2023-05-04 LAB — COMPLETE METABOLIC PANEL WITH GFR
AG Ratio: 2.7 (calc) — ABNORMAL HIGH (ref 1.0–2.5)
ALT: 19 U/L (ref 6–29)
AST: 18 U/L (ref 10–35)
Albumin: 4.8 g/dL (ref 3.6–5.1)
Alkaline phosphatase (APISO): 70 U/L (ref 37–153)
BUN: 25 mg/dL (ref 7–25)
CO2: 27 mmol/L (ref 20–32)
Calcium: 10.1 mg/dL (ref 8.6–10.4)
Chloride: 107 mmol/L (ref 98–110)
Creat: 0.76 mg/dL (ref 0.50–1.05)
Globulin: 1.8 g/dL — ABNORMAL LOW (ref 1.9–3.7)
Glucose, Bld: 80 mg/dL (ref 65–139)
Potassium: 4.9 mmol/L (ref 3.5–5.3)
Sodium: 142 mmol/L (ref 135–146)
Total Bilirubin: 0.2 mg/dL (ref 0.2–1.2)
Total Protein: 6.6 g/dL (ref 6.1–8.1)
eGFR: 86 mL/min/{1.73_m2} (ref >=60)

## 2023-05-04 LAB — SEDIMENTATION RATE: Sed Rate: 6 mm/h (ref 0–30)

## 2023-05-04 LAB — CBC WITH DIFFERENTIAL/PLATELET
Absolute Lymphocytes: 2592 {cells}/uL (ref 850–3900)
Absolute Monocytes: 900 {cells}/uL (ref 200–950)
Basophils Absolute: 132 {cells}/uL (ref 0–200)
Basophils Relative: 1.1 %
Eosinophils Absolute: 468 {cells}/uL (ref 15–500)
Eosinophils Relative: 3.9 %
HCT: 42.3 % (ref 35.0–45.0)
Hemoglobin: 13.9 g/dL (ref 11.7–15.5)
MCH: 31.1 pg (ref 27.0–33.0)
MCHC: 32.9 g/dL (ref 32.0–36.0)
MCV: 94.6 fL (ref 80.0–100.0)
MPV: 10.4 fL (ref 7.5–12.5)
Monocytes Relative: 7.5 %
Neutro Abs: 7908 {cells}/uL — ABNORMAL HIGH (ref 1500–7800)
Neutrophils Relative %: 65.9 %
Platelets: 417 10*3/uL — ABNORMAL HIGH (ref 140–400)
RBC: 4.47 10*6/uL (ref 3.80–5.10)
RDW: 14.5 % (ref 11.0–15.0)
Total Lymphocyte: 21.6 %
WBC: 12 10*3/uL — ABNORMAL HIGH (ref 3.8–10.8)

## 2023-05-04 LAB — C-REACTIVE PROTEIN: CRP: <3 mg/L (ref <8.0)

## 2023-05-04 NOTE — Telephone Encounter (Signed)
 Received signed copy from patient. Application faxed to [employer] w/ [employer]. Received a receipt of confirmation.

## 2023-05-04 NOTE — Progress Notes (Signed)
 CRP WNL

## 2023-05-04 NOTE — Progress Notes (Signed)
 ESR WNL  Globulin is borderline low.rest of CMP WNL. We will continue to monitor.

## 2023-05-10 DIAGNOSIS — M25571 Pain in right ankle and joints of right foot: Secondary | ICD-10-CM | POA: Diagnosis not present

## 2023-05-11 ENCOUNTER — Other Ambulatory Visit: Payer: Self-pay | Admitting: Rheumatology

## 2023-05-11 NOTE — Telephone Encounter (Signed)
 Last Fill: 04/12/2023  Next Visit: 07/21/2023  Last Visit: 03/31/2023  Dx: Degeneration of intervertebral disc of lumbar region with discogenic back pain or lower extremity pain   Current Dose per office note on 03/31/2023: baclofen as needed for muscle spasms.   Okay to refill Baclofen?

## 2023-05-12 ENCOUNTER — Encounter: Payer: Self-pay | Admitting: Family Medicine

## 2023-05-12 ENCOUNTER — Telehealth: Payer: Self-pay | Admitting: Family Medicine

## 2023-05-12 ENCOUNTER — Ambulatory Visit: Payer: Medicare PPO

## 2023-05-12 ENCOUNTER — Ambulatory Visit (INDEPENDENT_AMBULATORY_CARE_PROVIDER_SITE_OTHER): Payer: Medicare PPO | Admitting: Family Medicine

## 2023-05-12 VITALS — BP 119/76 | HR 76 | Temp 97.1°F | Ht 64.0 in | Wt 162.8 lb

## 2023-05-12 DIAGNOSIS — M0609 Rheumatoid arthritis without rheumatoid factor, multiple sites: Secondary | ICD-10-CM

## 2023-05-12 DIAGNOSIS — E785 Hyperlipidemia, unspecified: Secondary | ICD-10-CM

## 2023-05-12 DIAGNOSIS — J301 Allergic rhinitis due to pollen: Secondary | ICD-10-CM

## 2023-05-12 DIAGNOSIS — E669 Obesity, unspecified: Secondary | ICD-10-CM

## 2023-05-12 DIAGNOSIS — Z6827 Body mass index (BMI) 27.0-27.9, adult: Secondary | ICD-10-CM

## 2023-05-12 DIAGNOSIS — E1169 Type 2 diabetes mellitus with other specified complication: Secondary | ICD-10-CM

## 2023-05-12 DIAGNOSIS — Z7984 Long term (current) use of oral hypoglycemic drugs: Secondary | ICD-10-CM

## 2023-05-12 LAB — BAYER DCA HB A1C WAIVED: HB A1C (BAYER DCA - WAIVED): 5.9 % — ABNORMAL HIGH (ref 4.8–5.6)

## 2023-05-12 NOTE — Progress Notes (Signed)
 Subjective:  Patient ID: Christina Lewis, female    DOB: [birth year], 68 y.o.   MRN: [id number]  Patient Care Team: Sonny Masters, FNP as PCP - General (Family Medicine) Smitty Cords, OD (Optometry)   Chief Complaint:  Diabetes (3 month follow up ) and Ear Pain (Bilateral x 3-4 days )   HPI: Christina Lewis is a 68 y.o. female presenting on 05/12/2023 for Diabetes (3 month follow up ) and Ear Pain (Bilateral x 3-4 days )   Discussed the use of AI scribe software for clinical note transcription with the patient, who gave verbal consent to proceed.  History of Present Illness   Christina Lewis is a 68 year old female who presents for follow-up of her foot pain and other ongoing medical issues.  She has been experiencing plantar fasciitis in both feet, initially triggered during a flight. After one foot healed, the other flared up. She also has Achilles tendonitis. For treatment, she received steroid injections and was prescribed meloxicam. She was also placed in a boot but found it difficult to walk and experienced falls, leading her to discontinue its use.  She is managing her diabetes with Jardiance, taking one pill daily. Her blood sugars have not significantly increased with the steroid injections. No increased hunger, thirst, or urination.  Regarding her rheumatologic condition, she has switched from a preloaded injectable methotrexate to a version she has to draw up herself due to insurance issues. She is managing well with this change and continues to take Plaquenil.  She reports ongoing ear issues, which have improved significantly with the use of a steroid nasal wash (budesonide mixed with saline) twice daily. She still experiences some sniffles and snorties but notes a marked improvement. She suspects pollen exposure may have contributed to recent ear discomfort, as she was outside for a couple of days last week. She is not currently taking daily allergy medication but has  Xyzal at home.  In terms of her cholesterol management, she is taking her prescribed cholesterol medication but has stopped taking fish oil due to unpleasant burps, despite it being labeled as 'burpless'.          Relevant past medical, surgical, family, and social history reviewed and updated as indicated.  Allergies and medications reviewed and updated. Data reviewed: Chart in Epic.   Past Medical History:  Diagnosis Date   Allergy    Anemia    Anxiety    Arthritis    Cataract 2020   Depression    Diabetes mellitus    type 2   Fibromyalgia    GERD (gastroesophageal reflux disease)    Gout    no current problem   Hematuria    History of kidney stones    passed stones and also had surgery to remove one   History of tics    Hyperlipidemia 2007   Ineffective esophageal motility    tx with prilosec, essophageal was stretched   Interstitial cystitis 2016   Migraines    Osteoarthritis    PCOS (polycystic ovarian syndrome) 1993   Pneumonia 2019   PONV (postoperative nausea and vomiting)    Sleep apnea    does not use CPAP   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, took medication for a year   Thyroid nodule    Dr Horald Pollen is monitoring every year    Past Surgical History:  Procedure Laterality Date   ABDOMINAL HYSTERECTOMY  2000  APPENDECTOMY  1976   BALLOON DILATION N/A 11/03/2021   Procedure: BALLOON DILATION;  Surgeon: Kerin Salen, MD;  Location: [facility];  Service: Gastroenterology;  Laterality: N/A;   BOTOX INJECTION N/A 11/03/2021   Procedure: BOTOX INJECTION;  Surgeon: Kerin Salen, MD;  Location: [facility];  Service: Gastroenterology;  Laterality: N/A;   BREAST LUMPECTOMY  1989   lt-negative   CARDIOVASCULAR STRESS TEST  2000   CATARACT EXTRACTION W/PHACO Left 12/07/2019   Procedure: CATARACT EXTRACTION PHACO AND INTRAOCULAR LENS PLACEMENT (IOC);  Surgeon: [name], MD;  Location: [facility];  Service: Ophthalmology;  Laterality: Left;   CDE: 5.55   CHOLECYSTECTOMY  1985   COLONOSCOPY     DRUG INDUCED ENDOSCOPY Bilateral 09/29/2022   Procedure: DRUG INDUCED ENDOSCOPY;  Surgeon: Ashok Croon, MD;  Location: [facility];  Service: ENT;  Laterality: Bilateral;  15 MINUTES   ESOPHAGEAL MANOMETRY N/A 08/24/2021   Procedure: ESOPHAGEAL MANOMETRY (EM);  Surgeon: Kerin Salen, MD;  Location: [facility];  Service: Gastroenterology;  Laterality: N/A;   ESOPHAGOGASTRODUODENOSCOPY N/A 11/03/2021   Procedure: ESOPHAGOGASTRODUODENOSCOPY (EGD);  Surgeon: Kerin Salen, MD;  Location: [facility];  Service: Gastroenterology;  Laterality: N/A;   EYE SURGERY  2021   HYSTERECTOMY ABDOMINAL WITH SALPINGECTOMY Bilateral 2000   IMPLANTATION OF HYPOGLOSSAL NERVE STIMULATOR Right 11/24/2022   Procedure: IMPLANTATION OF HYPOGLOSSAL NERVE STIMULATOR;  Surgeon: Ashok Croon, MD;  Location: [facility];  Service: ENT;  Laterality: Right;   KIDNEY STONE SURGERY Right 2014   with stent placement in OR   KNEE ARTHROSCOPY Right 08/22/2013   Procedure: ARTHROSCOPY RIGHT KNEE FOR INFECTION LAVAGE AND DRAINAGE;  Surgeon: Thera Flake., MD;  Location: [facility];  Service: Orthopedics;  Laterality: Right;   KNEE ARTHROSCOPY WITH PATELLA RECONSTRUCTION Right 08/06/2013   Procedure: RIGHT KNEE ARTHROSCOPY WITH MENISCECTOMY MEDIAL, ARTHROSCOPY KNEE WITH DEBRIDEMENT/SHAVING (CHONDROPLASTY) ;  Surgeon: Thera Flake., MD;  Location: [facility];  Service: Orthopedics;  Laterality: Right;   LUMBAR LAMINECTOMY/DECOMPRESSION MICRODISCECTOMY Right 09/11/2019   Procedure: Right Lumbar Three-Four Microdiscectomy;  Surgeon: Maeola Harman, MD;  Location: [facility];  Service: Neurosurgery;  Laterality: Right;  Right Lumbar Three-Four Microdiscectomy   NASAL SINUS SURGERY     x4   OOPHORECTOMY     SHOULDER SURGERY  2011   right shoulder   SPINE SURGERY  2020   THYROID SURGERY  1994   TONSILLECTOMY Bilateral 1974    Social  History   Socioeconomic History   Marital status: Widowed    Spouse name: Chrissie Noa   Number of children: Not on file   Years of education: Not on file   Highest education level: Bachelor's degree (e.g., BA, AB, BS)  Occupational History   Occupation: retired  Tobacco Use   Smoking status: Never    Passive exposure: Past   Smokeless tobacco: Never  Vaping Use   Vaping status: Never Used  Substance and Sexual Activity   Alcohol use: Never   Drug use: Never   Sexual activity: Not Currently    Birth control/protection: Post-menopausal    Comment: Hysterectomy  Other Topics Concern   Not on file  Social History Narrative   Caffiene 4-5 12 oz cans soda.   Working retired - special ed.   Live with home no kids   Social Drivers of Health   Financial Resource Strain: Low Risk  (05/08/2023)   Overall Financial Resource Strain (CARDIA)    Difficulty of Paying Living Expenses: Not very  hard  Food Insecurity: No Food Insecurity (05/08/2023)   Hunger Vital Sign    Worried About Running Out of Food in the Last Year: Never true    Ran Out of Food in the Last Year: Never true  Transportation Needs: No Transportation Needs (05/08/2023)   PRAPARE - Administrator, Civil Service (Medical): No    Lack of Transportation (Non-Medical): No  Physical Activity: Inactive (05/08/2023)   Exercise Vital Sign    Days of Exercise per Week: 0 days    Minutes of Exercise per Session: 0 min  Stress: Stress Concern Present (05/08/2023)   [employer] of Occupational Health - Occupational Stress Questionnaire    Feeling of Stress : Rather much  Social Connections: Moderately Isolated (05/08/2023)   Social Connection and Isolation Panel [NHANES]    Frequency of Communication with Friends and Family: More than three times a week    Frequency of Social Gatherings with Friends and Family: More than three times a week    Attends Religious Services: 1 to 4 times per year    Active Member of [employer]  or Organizations: No    Attends Banker Meetings: Never    Marital Status: Widowed  Intimate Partner Violence: Not At Risk (02/18/2023)   Humiliation, Afraid, Rape, and Kick questionnaire    Fear of Current or Ex-Partner: No    Emotionally Abused: No    Physically Abused: No    Sexually Abused: No    Outpatient Encounter Medications as of 05/12/2023  Medication Sig   betamethasone dipropionate (DIPROLENE) 0.05 % ointment Apply topically 2 (two) times daily.   budesonide (PULMICORT) 0.5 MG/2ML nebulizer solution Take 2 mLs (0.5 mg total) by nebulization in the morning and at bedtime. MIX 1 VIAL WITH 250CC OF NORMAL SALINE FOR SINUS WASH DAILY AS NEEDED   cholecalciferol (VITAMIN D) 25 MCG (1000 UNIT) tablet Take 1,000 Units by mouth daily.   DULoxetine (CYMBALTA) 30 MG capsule Take one capsule daily for one week and than twice daily   folic acid (FOLVITE) 1 MG tablet TAKE TWO TABLETS BY MOUTH EVERY DAY   gabapentin (NEURONTIN) 300 MG capsule Take 300 mg by mouth 3 (three) times daily.   HYDROcodone-acetaminophen (NORCO/VICODIN) 5-325 MG tablet Take by mouth.   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   ibuprofen (ADVIL) 200 MG tablet Take 400 mg by mouth every 8 (eight) hours as needed for moderate pain.   JARDIANCE 10 MG TABS tablet Take 10 mg by mouth daily.   Lancets (ONETOUCH DELICA PLUS LANCET33G) MISC SMARTSIG:1 Topical Daily   MAGNESIUM PO Take 500 mg by mouth at bedtime.   methotrexate 50 MG/2ML injection Inject 0.8 mLs (20 mg total) into the skin once a week.   Multiple Vitamin (MULTIVITAMIN WITH MINERALS) TABS tablet Take 1 tablet by mouth daily.   omeprazole (PRILOSEC) 20 MG capsule Take 40 mg by mouth daily.   ondansetron (ZOFRAN-ODT) 8 MG disintegrating tablet Take 8 mg by mouth every 8 (eight) hours as needed for nausea.    ONETOUCH VERIO test strip 1 each daily.   Polyvinyl  Alcohol-Povidone (REFRESH OP) Apply to eye as needed.   Rimegepant Sulfate (NURTEC) 75 MG TBDP Take 1 tablet (75 mg total) by mouth daily as needed. For migraines. Take as close to onset of migraine as possible. One daily maximum.   tiZANidine (ZANAFLEX) 4 MG tablet TAKE ONE TABLET BY MOUTH AT BEDTIME  AS NEEDED   TUBERCULIN SYR 1CC/27GX1/2" (B-D TB SYRINGE 1CC/27GX1/2") 27G X 1/2" 1 ML MISC 12 Syringes by Does not apply route once a week.   [DISCONTINUED] baclofen (LIORESAL) 10 MG tablet TAKE 1 TABLET BY MOUTH EACH MORNING AND 1 TABLET AT NOON.   [DISCONTINUED] Eptinezumab-jjmr (VYEPTI) 100 MG/ML injection Inject into the vein every 3 (three) months.   [DISCONTINUED] NASAL WASH NA Place into the nose. Cortisone and Saline   [DISCONTINUED] sertraline (ZOLOFT) 100 MG tablet Take 1 tablet (100 mg total) by mouth daily. (Patient taking differently: Take 100 mg by mouth daily. Take 50 mg for one week and than discontinue)   No facility-administered encounter medications on file as of 05/12/2023.    Allergies  Allergen Reactions   Sulfa Antibiotics Anaphylaxis   Sulfamethoxazole-Trimethoprim Anaphylaxis   Atorvastatin Other (See Comments)    Severe Muscle and Joint pain with ALL statins    Codeine Itching   Erythromycin Nausea And Vomiting   Metronidazole Diarrhea   Morphine And Codeine Itching   Semaglutide Nausea And Vomiting    Ozempic*    Pertinent ROS per HPI, otherwise unremarkable      Objective:  BP 119/76   Pulse 76   Temp (!) 97.1 F (36.2 C)   Ht 5\' 4"  (1.626 m)   Wt 162 lb 12.8 oz (73.8 kg)   SpO2 96%   BMI 27.94 kg/m    Wt Readings from Last 3 Encounters:  05/12/23 162 lb 12.8 oz (73.8 kg)  03/31/23 162 lb (73.5 kg)  02/18/23 162 lb (73.5 kg)    Physical Exam Vitals and nursing note reviewed.  Constitutional:      Appearance: Normal appearance. She is well-developed, well-groomed and overweight.  HENT:     Head: Normocephalic and atraumatic.     Right Ear: A  middle ear effusion is present. Tympanic membrane is not erythematous.     Left Ear: A middle ear effusion is present. Tympanic membrane is not erythematous.     Nose: Nose normal.     Mouth/Throat:     Lips: Pink.     Mouth: Mucous membranes are moist.     Pharynx: Oropharynx is clear.  Eyes:     Conjunctiva/sclera: Conjunctivae normal.     Pupils: Pupils are equal, round, and reactive to light.  Cardiovascular:     Rate and Rhythm: Normal rate and regular rhythm.     Heart sounds: Normal heart sounds.  Pulmonary:     Effort: Pulmonary effort is normal.     Breath sounds: Normal breath sounds.  Musculoskeletal:     Right lower leg: No edema.     Left lower leg: No edema.  Skin:    General: Skin is warm and dry.     Capillary Refill: Capillary refill takes less than 2 seconds.  Neurological:     General: No focal deficit present.     Mental Status: She is alert and oriented to person, place, and time.     Motor: Tremor (resting) present.  Psychiatric:        Mood and Affect: Mood normal.        Behavior: Behavior normal. Behavior is cooperative.        Thought Content: Thought content normal.        Judgment: Judgment normal.      Results for orders placed or performed in visit on 05/12/23  Bayer DCA Hb A1c Waived   Collection Time: 05/12/23 10:43 AM  Result Value  Ref Range   HB A1C (BAYER DCA - WAIVED) 5.9 (H) 4.8 - 5.6 %       Pertinent labs & imaging results that were available during my care of the patient were reviewed by me and considered in my medical decision making.  Assessment & Plan:  Christina Lewis was seen today for diabetes and ear pain.  Diagnoses and all orders for this visit:  Type 2 diabetes mellitus with obesity (HCC) -     Bayer DCA Hb A1c Waived  Hyperlipidemia associated with type 2 diabetes mellitus (HCC)  Rheumatoid arthritis of multiple sites with negative rheumatoid factor (HCC)  Seasonal allergic rhinitis due to pollen     Assessment and  Plan    Plantar fasciitis and Achilles tendonitis She has plantar fasciitis and Achilles tendonitis in both feet, with one foot healing and the other flaring up. Steroid injections and meloxicam were administered. A boot was initially used but not tolerated due to falls. She is currently undergoing physical therapy. - Continue physical therapy - Follow up with specialist in a couple of weeks  Rheumatoid arthritis She is on methotrexate and Plaquenil. She switched from preloaded injectable methotrexate to drawing it up herself due to insurance issues and is managing well. Follow-up with rheumatology is scheduled for the end of April. - Continue methotrexate and Plaquenil - Follow up with rheumatology at the end of April  Eustachian tube dysfunction She experiences fluid behind the ears, likely aggravated by pollen. A steroid nasal wash with budesonide has significantly improved symptoms. She was advised to take an antihistamine nightly during pollen seasons. She has Xyzal at home and will try it for a couple of weeks. - Continue steroid nasal wash with budesonide - Take an antihistamine (e.g., Xyzal, Claritin, Zyrtec) nightly for a couple of weeks  Diabetes mellitus She is on Jardiance for diabetes management. No significant increase in blood glucose levels despite steroid injections. No increased hunger, thirst, or urination reported. - Continue Jardiance  Hyperlipidemia She is on cholesterol medication and has discontinued fish oil due to adverse effects (burping). - Continue cholesterol medication          Continue all other maintenance medications.  Follow up plan: Return if symptoms worsen or fail to improve.   Continue healthy lifestyle choices, including diet (rich in fruits, vegetables, and lean proteins, and low in salt and simple carbohydrates) and exercise (at least 30 minutes of moderate physical activity daily).  Educational handout given for DM  The above  assessment and management plan was discussed with the patient. The patient verbalized understanding of and has agreed to the management plan. Patient is aware to call the clinic if they develop any new symptoms or if symptoms persist or worsen. Patient is aware when to return to the clinic for a follow-up visit. Patient educated on when it is appropriate to go to the emergency department.   Kari Baars, FNP-C [facility] [phone]

## 2023-05-12 NOTE — Patient Instructions (Addendum)

## 2023-05-12 NOTE — Telephone Encounter (Signed)
 Appointment for DXA was made for tomorrow - patient aware requested the day and time as we could not do scan today because she had taken her calcium today.   Called patient to see if she needs to r/s - LMTCB

## 2023-05-13 ENCOUNTER — Other Ambulatory Visit

## 2023-05-13 NOTE — Telephone Encounter (Signed)
 Pt returned call requesting to reschedule, please advise

## 2023-05-13 NOTE — Telephone Encounter (Signed)
 Appt made for 05/19/2023

## 2023-05-15 ENCOUNTER — Ambulatory Visit

## 2023-05-19 ENCOUNTER — Ambulatory Visit (INDEPENDENT_AMBULATORY_CARE_PROVIDER_SITE_OTHER)

## 2023-05-19 ENCOUNTER — Ambulatory Visit (INDEPENDENT_AMBULATORY_CARE_PROVIDER_SITE_OTHER): Admitting: Otolaryngology

## 2023-05-19 ENCOUNTER — Encounter (INDEPENDENT_AMBULATORY_CARE_PROVIDER_SITE_OTHER): Payer: Self-pay | Admitting: Otolaryngology

## 2023-05-19 ENCOUNTER — Telehealth: Payer: Self-pay | Admitting: *Deleted

## 2023-05-19 VITALS — BP 124/79 | HR 88

## 2023-05-19 DIAGNOSIS — J029 Acute pharyngitis, unspecified: Secondary | ICD-10-CM

## 2023-05-19 DIAGNOSIS — R0982 Postnasal drip: Secondary | ICD-10-CM

## 2023-05-19 DIAGNOSIS — R0981 Nasal congestion: Secondary | ICD-10-CM | POA: Diagnosis not present

## 2023-05-19 DIAGNOSIS — H6993 Unspecified Eustachian tube disorder, bilateral: Secondary | ICD-10-CM

## 2023-05-19 DIAGNOSIS — Z13820 Encounter for screening for osteoporosis: Secondary | ICD-10-CM | POA: Diagnosis not present

## 2023-05-19 DIAGNOSIS — Z0000 Encounter for general adult medical examination without abnormal findings: Secondary | ICD-10-CM

## 2023-05-19 DIAGNOSIS — M25571 Pain in right ankle and joints of right foot: Secondary | ICD-10-CM | POA: Diagnosis not present

## 2023-05-19 DIAGNOSIS — K1379 Other lesions of oral mucosa: Secondary | ICD-10-CM | POA: Diagnosis not present

## 2023-05-19 DIAGNOSIS — Z9889 Other specified postprocedural states: Secondary | ICD-10-CM

## 2023-05-19 DIAGNOSIS — J3089 Other allergic rhinitis: Secondary | ICD-10-CM

## 2023-05-19 MED ORDER — METHYLPREDNISOLONE 4 MG PO TBPK: ORAL_TABLET | ORAL | 1 refills | Status: DC

## 2023-05-19 MED ORDER — NURTEC 75 MG PO TBDP: 75.0000 mg | ORAL_TABLET | Freq: Every day | ORAL | Status: DC | PRN

## 2023-05-19 NOTE — Progress Notes (Signed)
 ENT Progress Note:   Update 05/19/2023  Discussed the use of AI scribe software for clinical note transcription with the patient, who gave verbal consent to proceed.  History of Present Illness Christina Lewis is a 68 year old female who presents with fullness in her ears and sore throat.  She has a long-standing issue with sensation of fluid moving around in both ears. Last week, her primary care physician confirmed the presence of fluid in both ears without infection. Over the weekend, she experienced a sore throat with white patches and bumps inside her mouth. She suspected strep throat but had no fever, and the sore throat resolved quickly. She describes ear pain on both sides and a sensation of hearing as if 'in a barrel,' indicating hearing difficulties.  She has a history of eustachian tube dysfunction, previously treated with steroids, which provided relief for ear pressure. She noted that swallowing was painful over the weekend but has since improved. No current pain when opening and closing her jaw.  She has been using a nasal rinse, which has helped with constant sniffing, but stopped taking an antihistamine due to severe epistaxis. She continues to use Plaquenil. No fever or current pain when swallowing.  She reports white bumps and swelling inside the mouth, which appeared over the weekend.  Records Reviewed:  Initial Evaluation  Update 03/16/23 Discussed the use of AI scribe software for clinical note transcription with the patient, who gave verbal consent to proceed.  History of Present Illness   The patient is a 32 yoF who presents with persistent nasal congestion symptoms, which have not improved despite previous treatment. They describe a recent episode of significant nasal discharge, initially characterized by dark gray mucus, which later turned green. This has since resolved, but they continue to experience a sensation of nasal congestion.  They report not having received  a previously prescribed nasal steroid rinse, and have not been using any other medications recently. They did, however, complete a course of antibiotics and steroids prescribed during their last visit.  In addition to these symptoms, the patient also mentions the use of an Inspire device, with no reported issues. They have an upcoming appointment with a sleep specialist.  The patient has previously undergone a procedure for nasal turbinate reduction and sinus surgery.     Prior visit records reviewed: Update 02/04/23  History of Present Illness   The patient, with a history of prior sinus infections and sinus surgery, presents with recurrent symptoms despite treatment with antibiotics and steroids given by PCP. They describe a pattern of improvement following treatment, only to relapse within a week or two. Symptoms include ear pressure, a sensation of struggling to breathe though her nose, and constant need to clear the throat, blow her nose. These symptoms have been disruptive to their sleep.   A CT sinuses was performed 10/2022, with patent paranasal sinuses and evidence of prior FESS. She developed cold/URI sx right after CT.    The patient has been using Flonase for their nasal symptoms, having switched from another nasal spray due to excessive dryness. They have also been taking Xyzal, Sudafed, and Mucinex, and using saline rinses to alleviate their symptoms.   The patient also reports a sensation of fluid in their right ear. She is doing well with Inspire implant, and currently using it during sleep.   Records reviewed PCP office visit 02/01/23 The patient, with a history of sinus issues, presents with recurrent symptoms despite previous treatment with antibiotics and  steroids. They describe a pattern of improvement following treatment, only to relapse within a week or two. Symptoms include blocked ears, a sensation of struggling to breathe, and constant need to clear the throat, blow the  nose, and snore. These symptoms have been disruptive to their sleep.   The patient had seen an ENT specialist a few weeks prior, who found no significant issues at the time. However, the patient's symptoms have since returned. A CT scan was performed, but the patient fell ill after the scan. The patient also mentions difficulty controlling their blood sugar levels and has an upcoming appointment with their endocrinologist.   The patient has been using Flonase for their nasal symptoms, having switched from another nasal spray due to excessive dryness. They have also been taking Xyzal, Sudafed, and Mucinex, and using saline solution to alleviate their symptoms.   The patient also reports a sensation of fluid in their right ear and a swollen lymph node. They have a history of elevated liver enzymes and white blood cell count. The patient is scheduled to see the ENT specialist again soon  Update 01/14/23:   Discussed the use of AI scribe software for clinical note transcription with the patient, who gave verbal consent to proceed.  History of Present Illness   The patient, with a recent history of Inspire implant surgery, presents for follow-up. The patient reports mild discomfort at the site of submental incision at night. Despite these symptoms, the patient expresses satisfaction with the overall surgical outcome and how incisions appear currently.   In addition to post-surgical discomfort, the patient has been dealing with a persistent sinus issues/nasal congestion. They report a recent severe sinus infection, which required multiple changes in antibiotic therapy, including two courses of Augmentin. The infection has since improved, but the patient continues to experience significant post-nasal drainage, particularly at night, to the point of choking. Prior to the infection, the patient had been experiencing chronic nasal congestion and a constant need to clear the nose, which had temporarily resolved  with changes in medication but has since returned.  The patient is currently on a regimen of allergy medications, including nasal sprays. The patient has not been using saline rinses but has been using saline sprays.     Initial evaluation 09/22/22 Reason for Consult: OSA, nasal congestion and facial pain/pressure/post-nasal drainage/headaches/tinnitus    HPI: Christina Lewis is an 68 y.o. female with hx RA affecting small joints, f.b Rheum on Methotrexate and Plaquenil, hx of OSA, previously on CPAP for many years, however, discontinued CPAP use 2/2 being unable to tolerate the mask, here for multiple complaints.   1)  She is here for nasal congestion, nasal obstruction, and frontal headaches for years, feels nasal drainage and post-nasal drainage. She has decreased sense of smell, and she thinks it happened after covid 2021. Had allergy testing 4 years ago and it was negative. She had allergy shots for 4 yrs when she was a child. On Flonase and Zyrtec. She has hx of migraines, and feels they are under control after starting an infusion medication (new drug for migraines). She has frontal headache on the left when it acts up and with her sinus sx. 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 No new sx   3) hx of OSA  and had three CPAP machines (used one for 1.5 yrs) and diagnosed in 2002. She stopped using it due to intoleence and was recently re-established with sleep medicine. Recent sleep study done with [facility] neurology and she is interested in [place] implant.   3) Bilateral tinnitus, water in her ear, worse on the left, and she had ear infections in her ears and chronic sinus all her life, she  4) She reports hx of Esophageal dysmotility/GERD - on Prilosec.    Records Reviewed:  Being  seen by Neurology for migraines - last visit with NP [place] 04/08/22 - migraines behind left eye with aura sx   TFTs have always normal has Endocrinologist.  Thyroid U/S 05/06/22  Estimated total number of nodules >/= 1 cm: 2   Number of spongiform nodules >/=  2 cm not described below (TR1): 0   Number of mixed cystic and solid nodules >/= 1.5 cm not described below (TR2): 0   _________________________________________________________   Nodule # 4: Previously biopsied nodule in the left mid gland is unchanged at 1.2 x 1.2 x 1.1 cm.   Nodule # 6: Previously biopsied nodule in the left inferior gland is unchanged at 1.8 x 1.6 x 1.2 cm.   Additional small subcentimeter thyroid nodules again noted scattered throughout the right gland. No interval change. No new nodules or suspicious features.   IMPRESSION: 1. Previously biopsied nodules in the left mid and left lower gland remain unchanged. Greater than 10 years of stability is consistent with benignity. 2. No new nodules or suspicious features.    Past Medical History:  Diagnosis Date   Allergy    Anemia    Anxiety    Arthritis    Cataract 2020   Depression    Diabetes mellitus    type 2   Fibromyalgia    GERD (gastroesophageal reflux disease)    Gout    no current problem   Hematuria    History of kidney stones    passed stones and also had surgery to remove one   History of tics    Hyperlipidemia 2007   Ineffective esophageal motility    tx with prilosec, essophageal was stretched   Interstitial cystitis 2016   Migraines    Osteoarthritis    PCOS (polycystic ovarian syndrome) 1993   Pneumonia 2019   PONV (postoperative nausea and vomiting)    Sleep apnea    does not use CPAP   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, took medication for a year   Thyroid nodule    Dr Horald Pollen is monitoring every year    Past Surgical History:  Procedure Laterality Date   ABDOMINAL HYSTERECTOMY  2000    APPENDECTOMY  1976   BALLOON DILATION N/A 11/03/2021   Procedure: Marvis Repress DILATION;  Surgeon: Kerin Salen, MD;  Location: [facility];  Service: Gastroenterology;  Laterality: N/A;   BOTOX INJECTION N/A 11/03/2021   Procedure: BOTOX INJECTION;  Surgeon: Kerin Salen, MD;  Location: [facility];  Service: Gastroenterology;  Laterality: N/A;   BREAST LUMPECTOMY  1989   lt-negative   CARDIOVASCULAR STRESS TEST  2000   CATARACT EXTRACTION W/PHACO Left 12/07/2019   Procedure: CATARACT EXTRACTION PHACO AND INTRAOCULAR LENS PLACEMENT (IOC);  Surgeon: [name], MD;  Location: [facility];  Service: Ophthalmology;  Laterality: Left;  CDE: 5.55   CHOLECYSTECTOMY  1985   COLONOSCOPY     DRUG INDUCED ENDOSCOPY Bilateral 09/29/2022   Procedure: DRUG INDUCED ENDOSCOPY;  Surgeon: Irene Pap,  Eusebio Friendly, MD;  Location: [facility];  Service: ENT;  Laterality: Bilateral;  15 MINUTES   ESOPHAGEAL MANOMETRY N/A 08/24/2021   Procedure: ESOPHAGEAL MANOMETRY (EM);  Surgeon: Kerin Salen, MD;  Location: [facility];  Service: Gastroenterology;  Laterality: N/A;   ESOPHAGOGASTRODUODENOSCOPY N/A 11/03/2021   Procedure: ESOPHAGOGASTRODUODENOSCOPY (EGD);  Surgeon: Kerin Salen, MD;  Location: [facility];  Service: Gastroenterology;  Laterality: N/A;   EYE SURGERY  2021   HYSTERECTOMY ABDOMINAL WITH SALPINGECTOMY Bilateral 2000   IMPLANTATION OF HYPOGLOSSAL NERVE STIMULATOR Right 11/24/2022   Procedure: IMPLANTATION OF HYPOGLOSSAL NERVE STIMULATOR;  Surgeon: Ashok Croon, MD;  Location: [facility];  Service: ENT;  Laterality: Right;   KIDNEY STONE SURGERY Right 2014   with stent placement in OR   KNEE ARTHROSCOPY Right 08/22/2013   Procedure: ARTHROSCOPY RIGHT KNEE FOR INFECTION LAVAGE AND DRAINAGE;  Surgeon: Thera Flake., MD;  Location: [facility];  Service: Orthopedics;  Laterality: Right;   KNEE ARTHROSCOPY WITH PATELLA RECONSTRUCTION Right 08/06/2013   Procedure: RIGHT KNEE ARTHROSCOPY  WITH MENISCECTOMY MEDIAL, ARTHROSCOPY KNEE WITH DEBRIDEMENT/SHAVING (CHONDROPLASTY) ;  Surgeon: Thera Flake., MD;  Location: [facility];  Service: Orthopedics;  Laterality: Right;   LUMBAR LAMINECTOMY/DECOMPRESSION MICRODISCECTOMY Right 09/11/2019   Procedure: Right Lumbar Three-Four Microdiscectomy;  Surgeon: Maeola Harman, MD;  Location: [facility];  Service: Neurosurgery;  Laterality: Right;  Right Lumbar Three-Four Microdiscectomy   NASAL SINUS SURGERY     x4   OOPHORECTOMY     SHOULDER SURGERY  2011   right shoulder   SPINE SURGERY  2020   THYROID SURGERY  1994   TONSILLECTOMY Bilateral 1974    Family History  Problem Relation Age of Onset   Fibromyalgia Mother    Psoriasis Mother        psoriatic arthritis    Diabetes Mother    Heart disease Mother    Arthritis Mother    Asthma Mother    Hyperlipidemia Mother    Hypertension Mother    Stroke Mother    Psoriasis Sister        psoriatic arthritis    Diabetes Sister    Migraines Sister    Rheum arthritis Sister    Fibromyalgia Sister    Diabetes Sister    Arthritis Sister    Asthma Sister    Diabetes Sister    Hyperlipidemia Sister    Miscarriages / [place] Sister    Arthritis Sister    Asthma Sister    Diabetes Sister    Hyperlipidemia Sister    Hypertension Sister    Miscarriages / [place] Sister     Social History:  reports that she has never smoked. She has been exposed to tobacco smoke. She has never used smokeless tobacco. She reports that she does not drink alcohol and does not use drugs.  Allergies:  Allergies  Allergen Reactions   Sulfa Antibiotics Anaphylaxis   Sulfamethoxazole-Trimethoprim Anaphylaxis   Atorvastatin Other (See Comments)    Severe Muscle and Joint pain with ALL statins    Codeine Itching   Erythromycin Nausea And Vomiting   Metronidazole Diarrhea   Morphine And Codeine Itching   Semaglutide Nausea And Vomiting    Ozempic*    Medications: I have reviewed  the patient's current medications.  The PMH, PSH, Medications, Allergies, and SH were reviewed and updated.  ROS: Constitutional: Negative for fever, weight loss and weight gain. Cardiovascular: Negative for chest pain and dyspnea on exertion. Respiratory: Is not experiencing  shortness of breath at rest. Gastrointestinal: Negative for nausea and vomiting. Neurological: Negative for headaches. Psychiatric: The patient is not nervous/anxious  Blood pressure 124/79, pulse 88, SpO2 100%.  PHYSICAL EXAM:  Exam: General: Well-developed, well-nourished Respiratory Respiratory effort: Equal inspiration and expiration without stridor Cardiovascular Peripheral Vascular: Warm extremities with equal color/perfusion Eyes: No nystagmus with equal extraocular motion bilaterally Neuro/Psych/Balance: Patient oriented to person, place, and time; Appropriate mood and affect; Gait is intact with no imbalance; Cranial nerves I-XII are intact Head and Face Inspection: Normocephalic and atraumatic without mass or lesion Palpation: Facial skeleton intact without bony stepoffs Salivary Glands: No mass or tenderness Facial Strength: Facial motility symmetric and full bilaterally ENT Pinna: External ear intact and fully developed External canal: Canal is patent with intact skin Tympanic Membrane: Clear and mobile External Nose: No scar or anatomic deformity Bilateral inferior turbinate hypertrophy.  Lips, Teeth, and gums: Mucosa and teeth intact and viable TMJ: No pain to palpation with full mobility Oral cavity/oropharynx: No erythema or exudate, no lesions present Prominent minor salivary glands versus submucosal bumps along the buccal mucosa bilaterally and along lower lip vestibule no mucosal lesions noted Neck Neck and Trachea: Midline trachea without mass or lesion Thyroid: No mass or nodularity Lymphatics: No lymphadenopathy   Studies Reviewed: CT sinuses 11/18/22  FINDINGS: Paranasal  sinuses:   Frontal: Normally aerated. Patent frontal sinus drainage pathways.   Ethmoid: Postsurgical changes from prior bilateral inferior ethmoidectomies. Normally aerated.   Maxillary: Postsurgical changes from bilateral maxillary antrostomies. There is trace mucosal thickening of the floor of the left maxillary sinus.   Sphenoid: Normally aerated. Patent sphenoethmoidal recesses.   Right ostiomeatal unit: Patent.   Left ostiomeatal unit: Patent.   Nasal passages: Patent. Intact nasal septum is midline.   Anatomy: There is pneumatization superior to both anterior ethmoid notches. Symmetric and intact olfactory grooves and fovea ethmoidalis, Keros II (4-76mm). Sellar sphenoid pneumatization pattern. No dehiscence of carotid or optic canals. No onodi cell.   Other: Orbits and intracranial compartment are unremarkable. Left lens replacement. Visible mastoid air cells are normally aerated.   IMPRESSION: Postsurgical changes from prior FESS with trace mucosal thickening of the floor of the left maxillary sinus. Otherwise, the paranasal sinuses are normally aerated with patent sinus drainage pathways.    Assessment/Plan: Encounter Diagnoses  Name Primary?   Chronic nasal congestion Yes   Environmental and seasonal allergies    Dysfunction of both eustachian tubes    History of endoscopic sinus surgery    Post-nasal drip    Sore throat    Other lesions of oral mucosa     OSA and CPAP intolerance - s/p Inspire Implant and doing well, incisions look good today  - continue f/u with Sleep Medicine for device settings optimization  Chronic nasal congestion after URI sx end of September, 2024, with sx of not being able to breath through her nose, ear pressure, currently on Omnicef and Prednisone given by PCP. Sx with minimal improvement thus far. Nasal endoscopy without purulence or pus, mild crusting noted. CT sinuses 10/2022 without evidence of chronic sinusitis with evidence  of FESS b/l. - reviewed scan results with the patient  - continue nasal saline rinses and add steroids to the rinse (Rx faxed again) - finish abx and steroids  - continue Clarinex 5 mg daily, and Azelastine, hold Flonase when on nasal rinses with added steroids  Bilateral tinnitus described as ocean sounds and ear pressure, unable to pop her ears, unremarkable ear exam, and  no evidence of middle ear effusion, DDx age-related hearing loss versus eustachian tube dysfunction vs both - her current sx are c/w ETD - finish course of prednisone and continue management of nasal congestion as above  - will consider Audiogram in the future   Thyroid nodules -she will continue to follow-up with serial thyroid ultrasounds ordered by her PCP.  Prior workup including FNA not concerning for atypical cells and normal TFTs in the past.   I spent 30 minutes in total face-to-face time and in reviewing records during pre-charting, more than 50% of which was spent in counseling and coordination of care, reviewing test results, reviewing medications and treatment regimen and/or in discussing or reviewing the diagnosis, the prognosis and treatment options. Pertinent laboratory and imaging test results that were available during this visit with the patient were reviewed by me and considered in my medical decision making (see chart for details).   Update 03/16/23 Assessment and Plan    Recurrent Nasal Congestion  Persistent nasal congestion and inflammation, particularly on the left side, with dark gray and green mucus. Examination including nasal endoscopy without evidence of sinus infection, polyps, or pus. Has not used previously prescribed steroid nasal rinses due to pharmacy communication issue. History of antibiotics and steroids for viral illness a few weeks ago. Nasal sprays may be insufficient due to tight nasal space; saline rinses recommended to wash out potential irritants. - Refax prescription for steroid  rinse  - Start regular [employer] saline rinses - Refill Clarinex 5 mg daily and azelastine 2 puffs b/l nares NID for allergies - Schedule follow-up in a few months or call if needed  Tinnitus Ringing in the ears, already had hearing test.  - No immediate action required  Sleep Apnea (Inspire Therapy) Using Inspire device for sleep apnea with good results. Follow-up with sleep doctor next week. - Continue using Inspire device - Follow-up with sleep doctors next week.     Assessment and Plan Assessment & Plan Eustachian tube dysfunction and chronic nasal congestion No history of eustachian tube dysfunction environmental allergies. Chronic fluid sensation in ears with otalgia and autophony. No fluid observed on exam today ear exam is unremarkable.  No TMJ tenderness, advised to focus on reducing nasal congestion and allergies to alleviate ear pressure. - Resume allergy medication, Clarinex 5 mg daily.  Continue with nasal steroid rinses. - Apply Vaseline to nasal passages to prevent nasal dryness and epistaxis since she is using nasal steroid rinses. - Use ocean spray midday. - Provided information on eustachian tube dysfunction management. - Prescribe steroid pack if symptoms persist, she was instructed to take steroid pack to help with ear pressure and eustachian tube dysfunction, sent to [facility]othe[facility]ary in [place].  Nasal dryness and epistaxis Nasal dryness and epistaxis possibly due to steroid nasal rinse. Moisture maintenance is crucial. - Apply Vaseline to nasal passages. - Use ocean spray midday.  Oral mucosal lesions Submucosal bumps along buccal mucosa and lower lip vestibule, possibly reaction to a viral illness versus more prominent minor salivary glands.  Monitor for resolution or worsening.  If persist or if she develops new symptoms will consider biopsy for tissue diagnosis.  There are no apparent mucosal lesions to address and diffuse nature suggest systemic process.  We  discussed that her symptoms will likely resolve with time.  - Monitor lesions for 2-3 months. - Schedule follow-up for potential biopsy if lesions persist or new symptoms develop.     Ashok Croon, MD Otolaryngology [facility] Phone:  [phone] Fax: [phone]    05/19/2023, 9:35 AM

## 2023-05-19 NOTE — Telephone Encounter (Signed)
 Pt came in and brought her PAP applications for both Vyepti and Nurtec.  Dr. Lucia Gaskins said ok for 1 box Nurtec samples (8 tablets).  Pt has been on.

## 2023-05-19 NOTE — Patient Instructions (Signed)
 See information about Eustachian Tube Dysfunction below:    Overview The eustachian (say "you-STAY-shee-un") tubes connect the middle ear on each side to the back of the throat. They keep air pressure stable in the ears. If your eustachian tubes become blocked, the air pressure in your ears changes. A quick change in air pressure can cause eustachian tubes to close up. This might happen when an airplane changes altitude or when a scuba diver goes up or down underwater. And a cold can make the tubes swell and block the fluid in the middle ear from draining out. That can cause pain.  Eustachian tube problems often clear up on their own or after treating the cause of the blockage. If your tubes continue to be blocked, you may need surgery.  Follow-up care is a key part of your treatment and safety. Be sure to make and go to all appointments, and call your doctor or nurse advice line (811 in most provinces and territories) if you are having problems. It's also a good idea to know your test results and keep a list of the medicines you take.  How can you care for yourself at home? Try a simple exercise to help open blocked tubes. Close your mouth, hold your nose, and gently blow as if you are blowing your nose. Yawning and chewing gum also may help. You may hear or feel a "pop" when the tubes open. To ease ear pain, apply a warm face cloth or a heating pad set on low. There may be some drainage from the ear when the heat melts earwax. Put a cloth between the heat source and your skin. If your doctor prescribed antibiotics, take them as directed. Do not stop taking them just because you feel better. You need to take the full course of antibiotics. Be safe with medicines. Depending on the cause of the problem, your doctor may recommend over-the-counter medicine. For example, adults may try decongestants for cold symptoms or nasal spray steroids for allergies. Follow the instructions carefully.

## 2023-05-20 DIAGNOSIS — Z780 Asymptomatic menopausal state: Secondary | ICD-10-CM | POA: Diagnosis not present

## 2023-05-24 ENCOUNTER — Encounter: Payer: Self-pay | Admitting: Family Medicine

## 2023-05-24 ENCOUNTER — Telehealth (INDEPENDENT_AMBULATORY_CARE_PROVIDER_SITE_OTHER): Payer: Self-pay

## 2023-05-24 DIAGNOSIS — F331 Major depressive disorder, recurrent, moderate: Secondary | ICD-10-CM | POA: Diagnosis not present

## 2023-05-24 DIAGNOSIS — M25571 Pain in right ankle and joints of right foot: Secondary | ICD-10-CM | POA: Diagnosis not present

## 2023-05-24 DIAGNOSIS — F411 Generalized anxiety disorder: Secondary | ICD-10-CM | POA: Diagnosis not present

## 2023-05-24 NOTE — Telephone Encounter (Signed)
 Spoke with the patient to let her know I have started the prior auth for her Busdesonide nasal rinse.  I have faxed the last office notes over to her insurance company as requested to start the process, per insurance once they receive them it will take another 72 hrs to get a final ruling

## 2023-05-24 NOTE — Telephone Encounter (Signed)
 [employer] and [employer] application physician portions completed, signed, and faxed to the foundations. Received a receipt of confirmation.  [employer] fax number: [phone]  [employer] fax number: [phone]

## 2023-05-25 ENCOUNTER — Telehealth: Payer: Self-pay

## 2023-05-25 NOTE — Telephone Encounter (Signed)
 Sael from [employer] left message on Plastic Surgery voice mail to request more information regarding the budesonide nebulization Dr. Irene Pap prescribed for patient. He requested a call back at [phone]. Fax # [phone].

## 2023-05-26 ENCOUNTER — Encounter: Payer: Self-pay | Admitting: Family Medicine

## 2023-05-26 DIAGNOSIS — M25571 Pain in right ankle and joints of right foot: Secondary | ICD-10-CM | POA: Diagnosis not present

## 2023-05-26 DIAGNOSIS — F339 Major depressive disorder, recurrent, unspecified: Secondary | ICD-10-CM | POA: Insufficient documentation

## 2023-05-28 ENCOUNTER — Other Ambulatory Visit: Payer: Self-pay

## 2023-05-28 ENCOUNTER — Ambulatory Visit
Admission: EM | Admit: 2023-05-28 | Discharge: 2023-05-28 | Disposition: A | Discharge status: Home / Self Care | Attending: Family Medicine | Admitting: Family Medicine

## 2023-05-28 ENCOUNTER — Encounter: Payer: Self-pay | Admitting: Emergency Medicine

## 2023-05-28 DIAGNOSIS — H1031 Unspecified acute conjunctivitis, right eye: Secondary | ICD-10-CM | POA: Diagnosis not present

## 2023-05-28 MED ORDER — OLOPATADINE HCL 0.1 % OP SOLN: 1.0000 [drp] | Freq: Two times a day (BID) | OPHTHALMIC | 0 refills | Status: DC

## 2023-05-28 MED ORDER — OFLOXACIN 0.3 % OP SOLN: 1.0000 [drp] | Freq: Four times a day (QID) | OPHTHALMIC | 0 refills | Status: DC

## 2023-05-28 NOTE — Discharge Instructions (Signed)
 I have sent in an antihistamine drop in case your recurring irritation is related to allergic conjunctivitis.  You may also take a Zyrtec or Xyzal daily to help keep any sort of allergy irritation at bay.  I have also sent in an antibiotic drop to protect against infection while your eye is inflamed.  Cool compresses, over-the-counter pain relievers as needed.  Follow-up for worsening symptoms.

## 2023-05-28 NOTE — ED Triage Notes (Addendum)
 Pt reports "I thought I scratched my eye" x1 month reports called eye doctor and reports was told to get otc eye drops and reports pain and drainage subsided. Pt reports pain has returned last few days and reports "feels like grit at times in my eye". States right upper eye is sensitive to touch, right eye blurriness since yesterday morning. Reports wears glasses to watch tv and drive.

## 2023-05-28 NOTE — ED Provider Notes (Signed)
 [facility]    CSN: [id number] Arrival date & time: 05/28/23  1423      History   Chief Complaint Chief Complaint  Patient presents with   Eye Problem    HPI Christina Lewis is a 68 y.o. female.   HPI  Past Medical History:  Diagnosis Date   Allergy    Anemia    Anxiety    Arthritis    Cataract 2020   Depression    Diabetes mellitus    type 2   Fibromyalgia    GERD (gastroesophageal reflux disease)    Gout    no current problem   Hematuria    History of kidney stones    passed stones and also had surgery to remove one   History of tics    Hyperlipidemia 2007   Ineffective esophageal motility    tx with prilosec, essophageal was stretched   Interstitial cystitis 2016   Migraines    Osteoarthritis    PCOS (polycystic ovarian syndrome) 1993   Pneumonia 2019   PONV (postoperative nausea and vomiting)    Sleep apnea    does not use CPAP   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, took medication for a year   Thyroid nodule    Dr Horald Pollen is monitoring every year    Patient Active Problem List   Diagnosis Date Noted   Depression, recurrent (HCC) 05/26/2023   Chronic migraine without aura without status migrainosus, not intractable 04/26/2022   Dysphagia 10/15/2021   Hyperlipidemia associated with type 2 diabetes mellitus (HCC) 10/15/2021   Hyperglycemia due to type 2 diabetes mellitus (HCC) 10/15/2021   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   Vitamin D deficiency 10/15/2021   Polycystic ovaries 10/15/2021   Migraine without aura and without status migrainosus, not intractable 10/15/2021   BMI 29.0-29.9,adult 10/15/2021   Rheumatoid arthritis (HCC) 03/04/2021   Type 2 diabetes mellitus with obesity (HCC) 03/04/2021   Right foot infection 03/04/2021   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 myelopathy 09/11/2019   Leukocytosis 11/04/2016   Fibromyalgia 02/10/2016   Other fatigue 02/10/2016   Primary osteoarthritis of both hands 02/10/2016    History of migraine 02/10/2016   History of thyroid nodule 02/10/2016   History of sleep apnea 02/10/2016   History of renal calculi 02/10/2016   Pain in joint, shoulder region 10/02/2010   Adhesive capsulitis of shoulder 10/02/2010   Muscle weakness (generalized) 10/02/2010    Past Surgical History:  Procedure Laterality Date   ABDOMINAL HYSTERECTOMY  2000   APPENDECTOMY  1976   BALLOON DILATION N/A 11/03/2021   Procedure: Marvis Repress DILATION;  Surgeon: Kerin Salen, MD;  Location: [facility];  Service: Gastroenterology;  Laterality: N/A;   BOTOX INJECTION N/A 11/03/2021   Procedure: BOTOX INJECTION;  Surgeon: Kerin Salen, MD;  Location: [facility];  Service: Gastroenterology;  Laterality: N/A;   BREAST LUMPECTOMY  1989   lt-negative   CARDIOVASCULAR STRESS TEST  2000   CATARACT EXTRACTION W/PHACO Left 12/07/2019   Procedure: CATARACT EXTRACTION PHACO AND INTRAOCULAR LENS PLACEMENT (IOC);  Surgeon: [name], MD;  Location: [facility];  Service: Ophthalmology;  Laterality: Left;  CDE: 5.55   CHOLECYSTECTOMY  1985   COLONOSCOPY     DRUG INDUCED ENDOSCOPY Bilateral 09/29/2022   Procedure: DRUG INDUCED ENDOSCOPY;  Surgeon: Ashok Croon, MD;  Location: [facility];  Service: ENT;  Laterality: Bilateral;  15 MINUTES   ESOPHAGEAL MANOMETRY N/A 08/24/2021   Procedure: ESOPHAGEAL MANOMETRY (EM);  Surgeon:  Kerin Salen, MD;  Location: [facility];  Service: Gastroenterology;  Laterality: N/A;   ESOPHAGOGASTRODUODENOSCOPY N/A 11/03/2021   Procedure: ESOPHAGOGASTRODUODENOSCOPY (EGD);  Surgeon: Kerin Salen, MD;  Location: [facility];  Service: Gastroenterology;  Laterality: N/A;   EYE SURGERY  2021   HYSTERECTOMY ABDOMINAL WITH SALPINGECTOMY Bilateral 2000   IMPLANTATION OF HYPOGLOSSAL NERVE STIMULATOR Right 11/24/2022   Procedure: IMPLANTATION OF HYPOGLOSSAL NERVE STIMULATOR;  Surgeon: Ashok Croon, MD;  Location: [facility];  Service: ENT;  Laterality: Right;   KIDNEY  STONE SURGERY Right 2014   with stent placement in OR   KNEE ARTHROSCOPY Right 08/22/2013   Procedure: ARTHROSCOPY RIGHT KNEE FOR INFECTION LAVAGE AND DRAINAGE;  Surgeon: Thera Flake., MD;  Location: [facility];  Service: Orthopedics;  Laterality: Right;   KNEE ARTHROSCOPY WITH PATELLA RECONSTRUCTION Right 08/06/2013   Procedure: RIGHT KNEE ARTHROSCOPY WITH MENISCECTOMY MEDIAL, ARTHROSCOPY KNEE WITH DEBRIDEMENT/SHAVING (CHONDROPLASTY) ;  Surgeon: Thera Flake., MD;  Location: [facility];  Service: Orthopedics;  Laterality: Right;   LUMBAR LAMINECTOMY/DECOMPRESSION MICRODISCECTOMY Right 09/11/2019   Procedure: Right Lumbar Three-Four Microdiscectomy;  Surgeon: Maeola Harman, MD;  [facility];  Service: Neurosurgery;  Laterality: Right;  Right Lumbar Three-Four Microdiscectomy   NASAL SINUS SURGERY     x4   OOPHORECTOMY     SHOULDER SURGERY  2011   right shoulder   SPINE SURGERY  2020   THYROID SURGERY  1994   TONSILLECTOMY Bilateral 1974    OB History   No obstetric history on file.      Home Medications    Prior to Admission medications   Medication Sig Start Date End Date Taking? Authorizing Provider  baclofen (LIORESAL) 10 MG tablet TAKE 1 TABLET BY MOUTH EACH MORNING AND 1 TABLET AT NOON. 05/11/23   Gearldine Bienenstock, PA-C  betamethasone dipropionate (DIPROLENE) 0.05 % ointment Apply topically 2 (two) times daily. 02/14/23   Valentino Nose, NP  budesonide (PULMICORT) 0.5 MG/2ML nebulizer solution Take 2 mLs (0.5 mg total) by nebulization in the morning and at bedtime. MIX 1 VIAL WITH 250CC OF NORMAL SALINE FOR SINUS WASH DAILY AS NEEDED 04/19/23   Ashok Croon, MD  cholecalciferol (VITAMIN D) 25 MCG (1000 UNIT) tablet Take 1,000 Units by mouth daily.    [provider]  DULoxetine (CYMBALTA) 30 MG capsule Take one capsule daily for one week and than twice daily 04/21/23   Arfeen, Phillips Grout, MD  folic acid (FOLVITE) 1 MG tablet TAKE  TWO TABLETS BY MOUTH EVERY DAY 03/28/23   Gearldine Bienenstock, PA-C  gabapentin (NEURONTIN) 300 MG capsule Take 300 mg by mouth 3 (three) times daily.    [provider]  HYDROcodone-acetaminophen (NORCO/VICODIN) 5-325 MG tablet Take by mouth. 03/29/23   [provider]  hydroxychloroquine (PLAQUENIL) 200 MG tablet TAKE 1 TABLET TWICE DAILY MONDAY THRU FRIDAY FOR RHEUMATOID ARTHRTITIS. 03/24/23   Gearldine Bienenstock, PA-C  ibuprofen (ADVIL) 200 MG tablet Take 400 mg by mouth every 8 (eight) hours as needed for moderate pain.    [provider]  JARDIANCE 10 MG TABS tablet Take 10 mg by mouth daily.    Dorisann Frames, MD  Lancets (ONETOUCH DELICA PLUS LANCET33G) MISC SMARTSIG:1 Topical Daily 03/30/23   [provider]  MAGNESIUM PO Take 500 mg by mouth at bedtime.    [provider]  methotrexate 50 MG/2ML injection Inject 0.8 mLs (20 mg total) into the skin once a week. 04/15/23  Pollyann Savoy, MD  methylPREDNISolone (MEDROL DOSEPAK) 4 MG TBPK tablet Take with signs of chronic sinusitis and take as directed 05/19/23   Ashok Croon, MD  Multiple Vitamin (MULTIVITAMIN WITH MINERALS) TABS tablet Take 1 tablet by mouth daily.    [provider]  omeprazole (PRILOSEC) 20 MG capsule Take 40 mg by mouth daily. 09/23/21   [provider]  ondansetron (ZOFRAN-ODT) 8 MG disintegrating tablet Take 8 mg by mouth every 8 (eight) hours as needed for nausea.     [provider]  [facility] VERIO test strip 1 each daily. 03/30/23   [provider]  Polyvinyl Alcohol-Povidone (REFRESH OP) Apply to eye as needed.    [provider]  Rimegepant Sulfate (NURTEC) 75 MG TBDP Take 1 tablet (75 mg total) by mouth daily as needed. For migraines. Take as close to onset of migraine as possible. One daily maximum. 03/21/23   Butch Penny, NP  Rimegepant Sulfate (NURTEC) 75 MG TBDP Take 1 tablet (75 mg total) by mouth daily as needed. NDC [address]-8,  lot [id number] exp 07/2026 05/19/23   Anson Fret, MD  tiZANidine (ZANAFLEX) 4 MG tablet TAKE ONE TABLET BY MOUTH AT BEDTIME AS NEEDED 05/02/23   Gearldine Bienenstock, PA-C  TUBERCULIN SYR 1CC/27GX1/2" (B-D TB SYRINGE 1CC/27GX1/2") 27G X 1/2" 1 ML MISC 12 Syringes by Does not apply route once a week. 04/15/23   Pollyann Savoy, MD    Family History Family History  Problem Relation Age of Onset   Fibromyalgia Mother    Psoriasis Mother        psoriatic arthritis    Diabetes Mother    Heart disease Mother    Arthritis Mother    Asthma Mother    Hyperlipidemia Mother    Hypertension Mother    Stroke Mother    Psoriasis Sister        psoriatic arthritis    Diabetes Sister    Migraines Sister    Rheum arthritis Sister    Fibromyalgia Sister    Diabetes Sister    Arthritis Sister    Asthma Sister    Diabetes Sister    Hyperlipidemia Sister    Miscarriages / [place] Sister    Arthritis Sister    Asthma Sister    Diabetes Sister    Hyperlipidemia Sister    Hypertension Sister    Miscarriages / [place] Sister     Social History Social History   Tobacco Use   Smoking status: Never    Passive exposure: Past   Smokeless tobacco: Never  Vaping Use   Vaping status: Never Used  Substance Use Topics   Alcohol use: Never   Drug use: Never     Allergies   Sulfa antibiotics, Sulfamethoxazole-trimethoprim, Atorvastatin, Codeine, Erythromycin, Metronidazole, Morphine and codeine, and Semaglutide   Review of Systems Review of Systems   Physical Exam Triage Vital Signs ED Triage Vitals  Encounter Vitals Group     BP      Systolic BP Percentile      Diastolic BP Percentile      Pulse      Resp      Temp      Temp src      SpO2      Weight      Height      Head Circumference      Peak Flow      Pain Score      Pain Loc      Pain  Education      Exclude from Growth Chart    No data found.  Updated Vital Signs BP 115/80 (BP Location: Right Arm)   Pulse 80    Temp 98.6 F (37 C) (Oral)   Resp 18   SpO2 96%   Visual Acuity Right Eye Distance: 20/25 Left Eye Distance: 20/24 Bilateral Distance: 20/20  Right Eye Near:   Left Eye Near:    Bilateral Near:     Physical Exam   UC Treatments / Results  Labs (all labs ordered are listed, but only abnormal results are displayed) Labs Reviewed - No data to display  EKG   Radiology No results found.  Procedures Procedures (including critical care time)  Medications Ordered in UC Medications - No data to display  Initial Impression / Assessment and Plan / UC Course  I have reviewed the triage vital signs and the nursing notes.  Pertinent labs & imaging results that were available during my care of the patient were reviewed by me and considered in my medical decision making (see chart for details).     *** Final Clinical Impressions(s) / UC Diagnoses   Final diagnoses:  None   Discharge Instructions   None    ED Prescriptions   None    PDMP not reviewed this encounter.

## 2023-05-31 DIAGNOSIS — G4733 Obstructive sleep apnea (adult) (pediatric): Secondary | ICD-10-CM | POA: Diagnosis not present

## 2023-05-31 NOTE — Telephone Encounter (Signed)
 Christina Lewis has asked for a copy of the patient's front and back of insurance card. I have faxed this to [facility]. Received a receipt of confirmation.  [phone]

## 2023-06-01 ENCOUNTER — Ambulatory Visit (INDEPENDENT_AMBULATORY_CARE_PROVIDER_SITE_OTHER): Admitting: Otolaryngology

## 2023-06-01 ENCOUNTER — Encounter (INDEPENDENT_AMBULATORY_CARE_PROVIDER_SITE_OTHER): Payer: Self-pay | Admitting: Otolaryngology

## 2023-06-01 VITALS — BP 118/73 | HR 63

## 2023-06-01 DIAGNOSIS — J029 Acute pharyngitis, unspecified: Secondary | ICD-10-CM | POA: Diagnosis not present

## 2023-06-01 DIAGNOSIS — R0981 Nasal congestion: Secondary | ICD-10-CM

## 2023-06-01 DIAGNOSIS — H9311 Tinnitus, right ear: Secondary | ICD-10-CM

## 2023-06-01 DIAGNOSIS — J3089 Other allergic rhinitis: Secondary | ICD-10-CM

## 2023-06-01 DIAGNOSIS — R0982 Postnasal drip: Secondary | ICD-10-CM

## 2023-06-01 DIAGNOSIS — H6993 Unspecified Eustachian tube disorder, bilateral: Secondary | ICD-10-CM

## 2023-06-01 MED ORDER — LEVOCETIRIZINE DIHYDROCHLORIDE 5 MG PO TABS
5.0000 mg | ORAL_TABLET | Freq: Every evening | ORAL | 3 refills | Status: DC
Start: 2023-06-01 — End: 2023-06-28

## 2023-06-01 NOTE — Progress Notes (Signed)
 ENT Progress Note:   Update 06/01/2023  Discussed the use of AI scribe software for clinical note transcription with the patient, who gave verbal consent to proceed.  History of Present Illness Christina Lewis is a 68 year old female who presents with ear discomfort and chronic vs recurrent sore throat.  She experiences a sensation of both ears being 'clogged up,' with pain in the left ear and a 'swishy' sound in the right ear, described as 'like the ocean.' These symptoms began on Saturday night or Sunday. She has had similar ear issues for about six years, with previous ENT consultations suggesting ear tubes. She uses nasal rinses, decongestants, and antihistamines (Sudafed during the day and an unspecified antihistamine at night) to manage symptoms. She stopped taking Advil due to concerns about ear ringing. Her hearing was tested recently and was normal. No current infection in her ears is reported.  She reports a sore throat and mouth sores in her mouth, including ulcers in the back of her throat. She has a history of nonallergic rhinitis diagnosed four to five years ago and has undergone allergy testing via skin prick, which was negative. She received eye drops for an ulcer-like lesion under her eyelid but no ear drops.  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 She denies having reflux in the traditional sense but acknowledges esophageal spasms.  In her teens, she was allergic to many things and underwent allergy shots for two and a half years. She is open to retesting for allergies. She reports a history of sinus surgery and has been using steroid rinses every day.  Records Reviewed  Visit in ED 05/28/23 Patient presenting today with intermittent episodes for the past month of gritty sensation to the right eye, redness and injection. States she has been using over-the-counter drops for  this issue that were recommended by her eye specialist and that the symptoms always go away but often come back typically noting it first thing in the morning. She denies visual change, headache, nausea, vomiting, fevers, known injury to the eye   History of Present Illness Christina Lewis is a 68 year old female who presents with fullness in her ears and sore throat.  She has a long-standing issue with sensation of fluid moving around in both ears. Last week, her primary care physician confirmed the presence of fluid in both ears without infection. Over the weekend, she experienced a sore throat with white patches and bumps inside her mouth. She suspected strep throat but had no fever, and the sore throat resolved quickly. She describes ear pain on both sides and a sensation of hearing as if 'in a barrel,' indicating hearing difficulties.  She has a history of eustachian tube dysfunction, previously treated with steroids, which provided relief for ear pressure. She noted that swallowing was painful over the weekend but has since improved. No current pain when opening and closing her jaw.  She has been using a nasal rinse, which has helped with constant sniffing, but stopped taking an antihistamine due to severe epistaxis. She continues to use Plaquenil. No fever or current pain when swallowing.  She reports white bumps and swelling inside the mouth, which appeared over the weekend.   Christina Lewis is a 68 year old female who presents with ear discomfort and sore throat.  She experiences a sensation of both ears being 'stopped up,' with pain in  the left ear and a 'swishy' sound in the right ear, described as 'like the ocean.' These symptoms began on Saturday night or Sunday. She has had similar ear issues for about six years, with previous consultations suggesting ear tubes, but no resolution. She uses nasal rinses, decongestants, and antihistamines (Sudafed during the day and an unspecified  antihistamine at night) to manage symptoms. She stopped taking Advil due to concerns about ear ringing. Her hearing was tested recently and was normal. No current infection in her ears is reported.  She reports a sore throat and 'ulcery things' in her mouth, including 'blebs' and ulcers in the back of her throat. No current infection in her throat is noted. She has a history of nonallergic rhinitis diagnosed four to five years ago and has undergone allergy testing via skin prick, which was negative. She received eye drops for an ulcer-like lesion under her eyelid but no ear drops.  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 She takes Promoset for this condition. She denies having reflux in the traditional sense but acknowledges esophageal spasms.  In her teens, she was allergic to many things and underwent allergy shots for two and a half years. She is open to retesting for allergies. She reports a history of sinus surgery and has been using steroid rinses.  Records Reviewed:  Initial Evaluation  Update 03/16/23 Discussed the use of AI scribe software for clinical note transcription with the patient, who gave verbal consent to proceed.  History of Present Illness   The patient is a 15 yoF who presents with persistent nasal congestion symptoms, which have not improved despite previous treatment. They describe a recent episode of significant nasal discharge, initially characterized by dark gray mucus, which later turned green. This has since resolved, but they continue to experience a sensation of nasal congestion.  They report not having received a previously prescribed nasal steroid rinse, and have not been using any other medications recently. They did, however, complete a course of antibiotics and steroids prescribed during their last visit.  In addition to these symptoms, the patient also  mentions the use of an Inspire device, with no reported issues. They have an upcoming appointment with a sleep specialist.  The patient has previously undergone a procedure for nasal turbinate reduction and sinus surgery.     Prior visit records reviewed: Update 02/04/23  History of Present Illness   The patient, with a history of prior sinus infections and sinus surgery, presents with recurrent symptoms despite treatment with antibiotics and steroids given by PCP. They describe a pattern of improvement following treatment, only to relapse within a week or two. Symptoms include ear pressure, a sensation of struggling to breathe though her nose, and constant need to clear the throat, blow her nose. These symptoms have been disruptive to their sleep.   A CT sinuses was performed 10/2022, with patent paranasal sinuses and evidence of prior FESS. She developed cold/URI sx right after CT.    The patient has been using Flonase for their nasal symptoms, having switched from another nasal spray due to excessive dryness. They have also been taking Xyzal, Sudafed, and Mucinex, and using saline rinses to alleviate their symptoms.   The patient also reports a sensation of fluid in their right ear. She is doing well with Inspire implant, and currently using it during sleep.   Records reviewed PCP office visit 02/01/23  The patient, with a history of sinus issues, presents with recurrent symptoms despite previous treatment with antibiotics and steroids. They describe a pattern of improvement following treatment, only to relapse within a week or two. Symptoms include blocked ears, a sensation of struggling to breathe, and constant need to clear the throat, blow the nose, and snore. These symptoms have been disruptive to their sleep.   The patient had seen an ENT specialist a few weeks prior, who found no significant issues at the time. However, the patient's symptoms have since returned. A CT scan was performed,  but the patient fell ill after the scan. The patient also mentions difficulty controlling their blood sugar levels and has an upcoming appointment with their endocrinologist.   The patient has been using Flonase for their nasal symptoms, having switched from another nasal spray due to excessive dryness. They have also been taking Xyzal, Sudafed, and Mucinex, and using saline solution to alleviate their symptoms.   The patient also reports a sensation of fluid in their right ear and a swollen lymph node. They have a history of elevated liver enzymes and white blood cell count. The patient is scheduled to see the ENT specialist again soon  Update 01/14/23:   Discussed the use of AI scribe software for clinical note transcription with the patient, who gave verbal consent to proceed.  History of Present Illness   The patient, with a recent history of Inspire implant surgery, presents for follow-up. The patient reports mild discomfort at the site of submental incision at night. Despite these symptoms, the patient expresses satisfaction with the overall surgical outcome and how incisions appear currently.   In addition to post-surgical discomfort, the patient has been dealing with a persistent sinus issues/nasal congestion. They report a recent severe sinus infection, which required multiple changes in antibiotic therapy, including two courses of Augmentin. The infection has since improved, but the patient continues to experience significant post-nasal drainage, particularly at night, to the point of choking. Prior to the infection, the patient had been experiencing chronic nasal congestion and a constant need to clear the nose, which had temporarily resolved with changes in medication but has since returned.  The patient is currently on a regimen of allergy medications, including nasal sprays. The patient has not been using saline rinses but has been using saline sprays.     Initial evaluation  09/22/22 Reason for Consult: OSA, nasal congestion and facial pain/pressure/post-nasal drainage/headaches/tinnitus    HPI: Christina Lewis is an 68 y.o. female with hx RA affecting small joints, f.b Rheum on Methotrexate and Plaquenil, hx of OSA, previously on CPAP for many years, however, discontinued CPAP use 2/2 being unable to tolerate the mask, here for multiple complaints.   1)  She is here for nasal congestion, nasal obstruction, and frontal headaches for years, feels nasal drainage and post-nasal drainage. She has decreased sense of smell, and she thinks it happened after covid 2021. Had allergy testing 4 years ago and it was negative. She had allergy shots for 4 yrs when she was a child. On Flonase and Zyrtec. She has hx of migraines, and feels they are under control after starting an infusion medication (new drug for migraines). She has frontal headache on the left when it acts up and with her sinus sx. 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 No new sx   3) hx of OSA and had three CPAP machines (used one for 1.5 yrs) and diagnosed in 2002. She stopped using it due to intoleence and was recently re-established with sleep medicine. Recent sleep study done with [facility] neurology and she is interested in Dendron implant.   3) Bilateral tinnitus, water in her ear, worse on the left, and she had ear infections in her ears and chronic sinus all her life, she  4) She reports hx of Esophageal dysmotility/GERD - on Prilosec.       Past Medical History:  Diagnosis Date   Allergy    Anemia    Anxiety    Arthritis    Cataract 2020   Depression    Diabetes mellitus    type 2   Fibromyalgia    GERD (gastroesophageal reflux disease)    Gout    no current problem   Hematuria     History of kidney stones    passed stones and also had surgery to remove one   History of tics    Hyperlipidemia 2007   Ineffective esophageal motility    tx with prilosec, essophageal was stretched   Interstitial cystitis 2016   Migraines    Osteoarthritis    PCOS (polycystic ovarian syndrome) 1993   Pneumonia 2019   PONV (postoperative nausea and vomiting)    Sleep apnea    does not use CPAP   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, took medication for a year   Thyroid nodule    Dr Horald Pollen is monitoring every year    Past Surgical History:  Procedure Laterality Date   ABDOMINAL HYSTERECTOMY  2000   APPENDECTOMY  1976   BALLOON DILATION N/A 11/03/2021   Procedure: Marvis Repress DILATION;  Surgeon: Kerin Salen, MD;  Location: [facility];  Service: Gastroenterology;  Laterality: N/A;   BOTOX INJECTION N/A 11/03/2021   Procedure: BOTOX INJECTION;  Surgeon: Kerin Salen, MD;  Location: [facility];  Service: Gastroenterology;  Laterality: N/A;   BREAST LUMPECTOMY  1989   lt-negative   CARDIOVASCULAR STRESS TEST  2000   CATARACT EXTRACTION W/PHACO Left 12/07/2019   Procedure: CATARACT EXTRACTION PHACO AND INTRAOCULAR LENS PLACEMENT (IOC);  Surgeon: [name], MD;  Location: [facility];  Service: Ophthalmology;  Laterality: Left;  CDE: 5.55   CHOLECYSTECTOMY  1985   COLONOSCOPY     DRUG INDUCED ENDOSCOPY Bilateral 09/29/2022   Procedure: DRUG INDUCED ENDOSCOPY;  Surgeon: Ashok Croon, MD;  Location: [facility];  Service: ENT;  Laterality: Bilateral;  15 MINUTES   ESOPHAGEAL MANOMETRY N/A 08/24/2021   Procedure: ESOPHAGEAL MANOMETRY (EM);  Surgeon: Kerin Salen, MD;  Location: [facility];  Service: Gastroenterology;  Laterality: N/A;   ESOPHAGOGASTRODUODENOSCOPY N/A 11/03/2021   Procedure: ESOPHAGOGASTRODUODENOSCOPY (EGD);  Surgeon: Kerin Salen, MD;  Location: [facility];  Service: Gastroenterology;  Laterality: N/A;   EYE SURGERY  2021    HYSTERECTOMY ABDOMINAL WITH SALPINGECTOMY Bilateral 2000   IMPLANTATION OF HYPOGLOSSAL NERVE STIMULATOR Right 11/24/2022   Procedure: IMPLANTATION OF HYPOGLOSSAL NERVE STIMULATOR;  Surgeon: Ashok Croon, MD;  Location: [facility];  Service: ENT;  Laterality: Right;   KIDNEY STONE SURGERY Right 2014   with stent placement in OR   KNEE ARTHROSCOPY Right 08/22/2013   Procedure: ARTHROSCOPY RIGHT KNEE FOR INFECTION LAVAGE AND DRAINAGE;  Surgeon: Thera Flake., MD;  Location: [facility];  Service: Orthopedics;  Laterality:  Right;   KNEE ARTHROSCOPY WITH PATELLA RECONSTRUCTION Right 08/06/2013   Procedure: RIGHT KNEE ARTHROSCOPY WITH MENISCECTOMY MEDIAL, ARTHROSCOPY KNEE WITH DEBRIDEMENT/SHAVING (CHONDROPLASTY) ;  Surgeon: Thera Flake., MD;  Location: [facility];  Service: Orthopedics;  Laterality: Right;   LUMBAR LAMINECTOMY/DECOMPRESSION MICRODISCECTOMY Right 09/11/2019   Procedure: Right Lumbar Three-Four Microdiscectomy;  Surgeon: Maeola Harman, MD;  Location: [facility];  Service: Neurosurgery;  Laterality: Right;  Right Lumbar Three-Four Microdiscectomy   NASAL SINUS SURGERY     x4   OOPHORECTOMY     SHOULDER SURGERY  2011   right shoulder   SPINE SURGERY  2020   THYROID SURGERY  1994   TONSILLECTOMY Bilateral 1974    Family History  Problem Relation Age of Onset   Fibromyalgia Mother    Psoriasis Mother        psoriatic arthritis    Diabetes Mother    Heart disease Mother    Arthritis Mother    Asthma Mother    Hyperlipidemia Mother    Hypertension Mother    Stroke Mother    Psoriasis Sister        psoriatic arthritis    Diabetes Sister    Migraines Sister    Rheum arthritis Sister    Fibromyalgia Sister    Diabetes Sister    Arthritis Sister    Asthma Sister    Diabetes Sister    Hyperlipidemia Sister    Miscarriages / [place] Sister    Arthritis Sister    Asthma Sister    Diabetes Sister    Hyperlipidemia Sister    Hypertension  Sister    Miscarriages / [place] Sister     Social History:  reports that she has never smoked. She has been exposed to tobacco smoke. She has never used smokeless tobacco. She reports that she does not drink alcohol and does not use drugs.  Allergies:  Allergies  Allergen Reactions   Sulfa Antibiotics Anaphylaxis   Sulfamethoxazole-Trimethoprim Anaphylaxis   Atorvastatin Other (See Comments)    Severe Muscle and Joint pain with ALL statins    Codeine Itching   Erythromycin Nausea And Vomiting   Metronidazole Diarrhea   Morphine And Codeine Itching   Semaglutide Nausea And Vomiting    Ozempic*    Medications: I have reviewed the patient's current medications.  The PMH, PSH, Medications, Allergies, and SH were reviewed and updated.  ROS: Constitutional: Negative for fever, weight loss and weight gain. Cardiovascular: Negative for chest pain and dyspnea on exertion. Respiratory: Is not experiencing shortness of breath at rest. Gastrointestinal: Negative for nausea and vomiting. Neurological: Negative for headaches. Psychiatric: The patient is not nervous/anxious  Blood pressure 118/73, pulse 63, SpO2 98%.  PHYSICAL EXAM:  Exam: General: Well-developed, well-nourished Respiratory Respiratory effort: Equal inspiration and expiration without stridor Cardiovascular Peripheral Vascular: Warm extremities with equal color/perfusion Eyes: No nystagmus with equal extraocular motion bilaterally Neuro/Psych/Balance: Patient oriented to person, place, and time; Appropriate mood and affect; Gait is intact with no imbalance; Cranial nerves I-XII are intact Head and Face Inspection: Normocephalic and atraumatic without mass or lesion Palpation: Facial skeleton intact without bony stepoffs Salivary Glands: No mass or tenderness Facial Strength: Facial motility symmetric and full bilaterally ENT Pinna: External ear intact and fully developed External canal: Canal is patent with  intact skin Tympanic Membrane: Clear and mobile External Nose: No scar or anatomic deformity Bilateral inferior turbinate hypertrophy.  Lips, Teeth, and gums: Mucosa and teeth intact and viable TMJ: No pain to  palpation with full mobility Oral cavity/oropharynx: No erythema or exudate, no lesions present Small area of mucosal irregularity along the lingual surface of the left mandible, no bleeding or erythema, no other ulcerations masses lesion noted  Neck Neck and Trachea: Midline trachea without mass or lesion Thyroid: No mass or nodularity Lymphatics: No lymphadenopathy   Studies Reviewed: CT sinuses 11/18/22  FINDINGS: Paranasal sinuses:   Frontal: Normally aerated. Patent frontal sinus drainage pathways.   Ethmoid: Postsurgical changes from prior bilateral inferior ethmoidectomies. Normally aerated.   Maxillary: Postsurgical changes from bilateral maxillary antrostomies. There is trace mucosal thickening of the floor of the left maxillary sinus.   Sphenoid: Normally aerated. Patent sphenoethmoidal recesses.   Right ostiomeatal unit: Patent.   Left ostiomeatal unit: Patent.   Nasal passages: Patent. Intact nasal septum is midline.   Anatomy: There is pneumatization superior to both anterior ethmoid notches. Symmetric and intact olfactory grooves and fovea ethmoidalis, Keros II (4-21mm). Sellar sphenoid pneumatization pattern. No dehiscence of carotid or optic canals. No onodi cell.   Other: Orbits and intracranial compartment are unremarkable. Left lens replacement. Visible mastoid air cells are normally aerated.   IMPRESSION: Postsurgical changes from prior FESS with trace mucosal thickening of the floor of the left maxillary sinus. Otherwise, the paranasal sinuses are normally aerated with patent sinus drainage pathways.    Assessment/Plan: Encounter Diagnoses  Name Primary?   Tinnitus of right ear Yes   Environmental and seasonal allergies    Post-nasal  drip    Chronic nasal congestion    Dysfunction of both eustachian tubes      OSA and CPAP intolerance - s/p Inspire Implant and doing well, incisions look good today  - continue f/u with Sleep Medicine for device settings optimization  Chronic nasal congestion after URI sx end of September, 2024, with sx of not being able to breath through her nose, ear pressure, currently on Omnicef and Prednisone given by PCP. Sx with minimal improvement thus far. Nasal endoscopy without purulence or pus, mild crusting noted. CT sinuses 10/2022 without evidence of chronic sinusitis with evidence of FESS b/l. - reviewed scan results with the patient  - continue nasal saline rinses and add steroids to the rinse (Rx faxed again) - finish abx and steroids  - continue Clarinex 5 mg daily, and Azelastine, hold Flonase when on nasal rinses with added steroids  Bilateral tinnitus described as ocean sounds and ear pressure, unable to pop her ears, unremarkable ear exam, and no evidence of middle ear effusion, DDx age-related hearing loss versus eustachian tube dysfunction vs both - her current sx are c/w ETD - finish course of prednisone and continue management of nasal congestion as above  - will consider Audiogram in the future   Thyroid nodules -she will continue to follow-up with serial thyroid ultrasounds ordered by her PCP.  Prior workup including FNA not concerning for atypical cells and normal TFTs in the past.   I spent 30 minutes in total face-to-face time and in reviewing records during pre-charting, more than 50% of which was spent in counseling and coordination of care, reviewing test results, reviewing medications and treatment regimen and/or in discussing or reviewing the diagnosis, the prognosis and treatment options. Pertinent laboratory and imaging test results that were available during this visit with the patient were reviewed by me and considered in my medical decision making (see chart for  details).   Update 03/16/23 Assessment and Plan    Recurrent Nasal Congestion  Persistent nasal congestion and  inflammation, particularly on the left side, with dark gray and green mucus. Examination including nasal endoscopy without evidence of sinus infection, polyps, or pus. Has not used previously prescribed steroid nasal rinses due to pharmacy communication issue. History of antibiotics and steroids for viral illness a few weeks ago. Nasal sprays may be insufficient due to tight nasal space; saline rinses recommended to wash out potential irritants. - Refax prescription for steroid rinse  - Start regular Neilmed saline rinses - Refill Clarinex 5 mg daily and azelastine 2 puffs b/l nares NID for allergies - Schedule follow-up in a few months or call if needed  Tinnitus Ringing in the ears, already had hearing test.  - No immediate action required  Sleep Apnea (Inspire Therapy) Using Inspire device for sleep apnea with good results. Follow-up with sleep doctor next week. - Continue using Inspire device - Follow-up with sleep doctors next week.     Update 06/01/23 Assessment & Plan Eustachian tube dysfunction and chronic nasal congestion Chronic fluid sensation in ears with otalgia and the right at times. No fluid observed on exam today, ear exam is unremarkable.  No TMJ tenderness, advised to focus on reducing nasal congestion and allergies to alleviate ear pressure. -   Continue with nasal steroid rinses. - Apply Vaseline to nasal passages to prevent nasal dryness and epistaxis since she is using nasal steroid rinses. - Use ocean spray midday. - Provided information on eustachian tube dysfunction management. - Consider ear tubes if symptoms persist and other treatments fail. - Rotate antihistamine to Xyzal. - Order allergy testing.  Nasal dryness and epistaxis Nasal dryness and epistaxis possibly due to steroid nasal rinse. Moisture maintenance is crucial. - Apply Vaseline to  nasal passages. - Use ocean spray midday.  Oral mucosal lesions   There are no apparent mucosal lesions to Christina Lewis does have a small area of mucosal irregularity along the lingual aspect of the left mandible, which is 1 mm in size and appears as if there is some mechanical trauma to the area. We discussed that her symptoms will likely resolve with time. Previously seen small mucosal bumps have resolved. - Monitor for now - avoid irritating mouthwash   Sore throat Chronic sore throat without infection. Possible reflux and postnasal drainage as potential causes of her sx.  - Recommend natural seaweed paste reflux gourmet after meals, especially dinner. - Continue Prilosec. - Order allergy testing. - medical management of post-nasal drainage as above  RTC 3 mo after allergy testing   I spent 30 minutes in total face-to-face time and in reviewing records during pre-charting, more than 50% of which was spent in counseling and coordination of care, reviewing test results, reviewing medications and treatment regimen and/or in discussing or reviewing the diagnosis, the prognosis and treatment options. Pertinent laboratory and imaging test results that were available during this visit with the patient were reviewed by me and considered in my medical decision making (see chart for details).    Ashok Croon, MD Otolaryngology [facility] Phone: [phone] Fax: [phone]    06/02/2023, 9:20 AM

## 2023-06-01 NOTE — Patient Instructions (Signed)

## 2023-06-02 DIAGNOSIS — M5416 Radiculopathy, lumbar region: Secondary | ICD-10-CM | POA: Diagnosis not present

## 2023-06-06 DIAGNOSIS — F330 Major depressive disorder, recurrent, mild: Secondary | ICD-10-CM | POA: Diagnosis not present

## 2023-06-06 DIAGNOSIS — F411 Generalized anxiety disorder: Secondary | ICD-10-CM | POA: Diagnosis not present

## 2023-06-08 ENCOUNTER — Ambulatory Visit: Attending: Rheumatology | Admitting: Rheumatology

## 2023-06-08 VITALS — BP 125/83 | HR 90

## 2023-06-08 DIAGNOSIS — M216X1 Other acquired deformities of right foot: Secondary | ICD-10-CM | POA: Diagnosis not present

## 2023-06-08 DIAGNOSIS — M7061 Trochanteric bursitis, right hip: Secondary | ICD-10-CM

## 2023-06-08 DIAGNOSIS — M67961 Unspecified disorder of synovium and tendon, right lower leg: Secondary | ICD-10-CM | POA: Diagnosis not present

## 2023-06-08 MED ORDER — TRIAMCINOLONE ACETONIDE 40 MG/ML IJ SUSP
40.0000 mg | INTRAMUSCULAR | Status: AC | PRN
  Administered 2023-06-08: 40 mg via INTRA_ARTICULAR

## 2023-06-08 MED ORDER — LIDOCAINE HCL 1 % IJ SOLN
1.5000 mL | INTRAMUSCULAR | Status: AC | PRN
  Administered 2023-06-08: 1.5 mL

## 2023-06-08 NOTE — Progress Notes (Signed)
   Procedure Note  Patient: Christina Lewis             Date of Birth: 1955-09-12           MRN: [id number]             Visit Date: 06/08/2023  Procedures: Visit Diagnoses:  1. Trochanteric bursitis of right hip    Patient was here to get the right trochanteric bursa injection.  She had good relief to right trochanteric bursa injection back in September 2024 and now has recurrence of the pain.  She tenderness over the right trochanteric region.  Side effects of cortisone injection including the risk of hypertension, high blood glucose level, risk of tendon and nerve injury, dermal atrophy and hypopigmentation was discussed.  Large Joint Inj: R greater trochanter on 06/08/2023 1:04 PM Indications: pain Details: 27 G 1.5 in needle, lateral approach  Arthrogram: No  Medications: 40 mg triamcinolone acetonide 40 MG/ML; 1.5 mL lidocaine 1 % Aspirate: 0 mL Outcome: tolerated well, no immediate complications Procedure, treatment alternatives, risks and benefits explained, specific risks discussed. Consent was given by the patient. Immediately prior to procedure a time out was called to verify the correct patient, procedure, equipment, support staff and site/side marked as required. Patient was prepped and draped in the usual sterile fashion.     Patient tolerated the procedure well.  Postprocedure instructions were given.  Nicholas Bari, MD

## 2023-06-09 ENCOUNTER — Telehealth: Payer: Self-pay | Admitting: *Deleted

## 2023-06-09 ENCOUNTER — Ambulatory Visit (HOSPITAL_COMMUNITY): Payer: HMO | Admitting: Psychiatry

## 2023-06-09 NOTE — Telephone Encounter (Signed)
 Received fax from [facility]. Pt has been accepted into savings program through 02/22/2024.

## 2023-06-09 NOTE — Telephone Encounter (Signed)
 Received call from [facility] about needing pages 3 and 5 from the [employer] PAP application.  I refaxed all the pages to them at [phone]. I spoke to pt as well, she was calling about her migraines worsening, phantom smells coming back.  Not having her medications.  She will call pcp for possible treatment toradol inj, steroids if applicable when having migraine. She will call [facility] pap to see where she is in process.  She stated that last 2 wks she has had 3-4 migraines.  I told her I refaxed to [employer] PAP the pages they wanted.  Hopefully she will hear from them soon.  She may need another appt depending on this outcome of her PAP applications.  She verbalized understanding of plan.  Dr. Tresia Fruit was out today.

## 2023-06-10 ENCOUNTER — Other Ambulatory Visit: Payer: Self-pay | Admitting: Rheumatology

## 2023-06-10 MED ORDER — BACLOFEN 10 MG PO TABS
ORAL_TABLET | ORAL | 0 refills | Status: DC
Start: 2023-06-10 — End: 2023-07-10

## 2023-06-10 NOTE — Telephone Encounter (Signed)
 Last Fill: 05/11/2023  Next Visit: 07/21/2023  Last Visit: 03/31/2023  DX: Degeneration of intervertebral disc of lumbar region with discogenic back pain or lower extremity pain   Current Dose per office note on 03/31/2023: baclofen  as needed for muscle spasms.   Okay to refill baclofen ?

## 2023-06-13 ENCOUNTER — Other Ambulatory Visit: Payer: Self-pay | Admitting: Physician Assistant

## 2023-06-13 DIAGNOSIS — M0609 Rheumatoid arthritis without rheumatoid factor, multiple sites: Secondary | ICD-10-CM

## 2023-06-13 NOTE — Telephone Encounter (Signed)
 Last Fill: 03/24/2023  Eye exam: 01/24/2023 WNL    Labs: 05/03/2023 WBC count is elevated-12.0.  absolute neutrophils are elevated.  Platelet count is elevated-417K.  Rest of CBC WNL.     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

## 2023-06-14 ENCOUNTER — Other Ambulatory Visit (INDEPENDENT_AMBULATORY_CARE_PROVIDER_SITE_OTHER): Payer: Self-pay | Admitting: Otolaryngology

## 2023-06-15 ENCOUNTER — Other Ambulatory Visit: Payer: Self-pay | Admitting: Neurology

## 2023-06-15 DIAGNOSIS — G4733 Obstructive sleep apnea (adult) (pediatric): Secondary | ICD-10-CM | POA: Diagnosis not present

## 2023-06-15 DIAGNOSIS — F5104 Psychophysiologic insomnia: Secondary | ICD-10-CM | POA: Diagnosis not present

## 2023-06-15 MED ORDER — METHYLPREDNISOLONE 4 MG PO TBPK
ORAL_TABLET | ORAL | 1 refills | Status: DC
Start: 2023-06-15 — End: 2023-06-28

## 2023-06-21 ENCOUNTER — Other Ambulatory Visit: Payer: Self-pay | Admitting: Physician Assistant

## 2023-06-21 NOTE — Telephone Encounter (Signed)
 Last Fill: 05/02/2023  Next Visit: 07/21/2023  Last Visit: 03/31/2023  Dx: Fibromyalgia   Current Dose per office note on 03/31/2023: not mentioned   Okay to refill Tizanidine ?

## 2023-06-22 DIAGNOSIS — F331 Major depressive disorder, recurrent, moderate: Secondary | ICD-10-CM | POA: Diagnosis not present

## 2023-06-22 DIAGNOSIS — F411 Generalized anxiety disorder: Secondary | ICD-10-CM | POA: Diagnosis not present

## 2023-06-27 ENCOUNTER — Other Ambulatory Visit (HOSPITAL_COMMUNITY): Payer: Self-pay | Admitting: Family Medicine

## 2023-06-27 DIAGNOSIS — Z1231 Encounter for screening mammogram for malignant neoplasm of breast: Secondary | ICD-10-CM

## 2023-06-28 ENCOUNTER — Encounter: Payer: Self-pay | Admitting: Family Medicine

## 2023-06-28 ENCOUNTER — Ambulatory Visit (INDEPENDENT_AMBULATORY_CARE_PROVIDER_SITE_OTHER): Admitting: Family Medicine

## 2023-06-28 ENCOUNTER — Ambulatory Visit: Payer: Medicare PPO | Admitting: Podiatry

## 2023-06-28 VITALS — BP 123/74 | HR 92 | Temp 96.8°F | Ht 64.0 in | Wt 161.2 lb

## 2023-06-28 DIAGNOSIS — R519 Headache, unspecified: Secondary | ICD-10-CM | POA: Diagnosis not present

## 2023-06-28 DIAGNOSIS — J0101 Acute recurrent maxillary sinusitis: Secondary | ICD-10-CM

## 2023-06-28 DIAGNOSIS — M791 Myalgia, unspecified site: Secondary | ICD-10-CM | POA: Diagnosis not present

## 2023-06-28 DIAGNOSIS — R0981 Nasal congestion: Secondary | ICD-10-CM | POA: Diagnosis not present

## 2023-06-28 MED ORDER — PREDNISONE 20 MG PO TABS: 40.0000 mg | ORAL_TABLET | Freq: Every day | ORAL | 0 refills | Status: AC

## 2023-06-28 MED ORDER — AMOXICILLIN-POT CLAVULANATE 875-125 MG PO TABS: 1.0000 | ORAL_TABLET | Freq: Two times a day (BID) | ORAL | 0 refills | Status: DC

## 2023-06-28 NOTE — Telephone Encounter (Signed)
 We received a fax from [employer]. Pt missed a place on form that requires her signature and she also needs to print her name. I have printed out a new blank sheet where she can sign it. We can send it to her email or she can come by the office to get it.

## 2023-06-28 NOTE — Telephone Encounter (Signed)
 Pt is reporting that there is a form that she is being told needs completing, she is asking for a call to discuss if she needs to bring it to the office for completion, also pt is asking for a call to discuss a need for samples while waiting to get a Rx for [employer]

## 2023-06-28 NOTE — Progress Notes (Signed)
 Subjective:  Patient ID: Christina Lewis, female    DOB: 10-07-1955, 68 y.o.   MRN: [id number]  Patient Care Team: Galvin Jules, FNP as PCP - General (Family Medicine) Augustin Bloch, OD (Optometry)   Chief Complaint:  Generalized Body Aches, Nasal Congestion, and Facial Pain (X 5 days- otc mucinex and sudafed )   HPI: Christina Lewis is a 68 y.o. female presenting on 06/28/2023 for Generalized Body Aches, Nasal Congestion, and Facial Pain (X 5 days- otc mucinex and sudafed )   History of Present Illness   Christina Lewis is a 68 year old female who presents with sinus congestion and facial pain.  She has been experiencing sinus congestion and facial pain since Thursday, accompanied by a persistent headache and soreness around her cheeks, described as 'like touching a ball.' Despite using a nasal wash with budesonide  twice daily, she continues to feel congested. Recently, she has been using two packs of saline instead of one, which has caused significant pain upon use.  She has a history of sinus issues, where bacteria tend to accumulate, leading to congestion. No fever, respiratory symptoms, or wheezing have been noted. She has not experienced any asthma-like symptoms.  Her current medications include a nasal wash with budesonide  twice daily and occasional use of allergy medications, including clarinets and another unspecified prescription. She is unsure of their effectiveness.  She has a known allergy to sulfa drugs and experiences nausea with Keflex. Her last antibiotic treatment was doxycycline  around Christmas.          Relevant past medical, surgical, family, and social history reviewed and updated as indicated.  Allergies and medications reviewed and updated. Data reviewed: Chart in Epic.   Past Medical History:  Diagnosis Date   Allergy    Anemia    Anxiety    Arthritis    Cataract 2020   Depression    Diabetes mellitus    type 2   Fibromyalgia    GERD  (gastroesophageal reflux disease)    Gout    no current problem   Hematuria    History of kidney stones    passed stones and also had surgery to remove one   History of tics    Hyperlipidemia 2007   Ineffective esophageal motility    tx with prilosec, essophageal was stretched   Interstitial cystitis 2016   Migraines    Osteoarthritis    PCOS (polycystic ovarian syndrome) 1993   Pneumonia 2019   PONV (postoperative nausea and vomiting)    Sleep apnea    does not use CPAP   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, took medication for a year   Thyroid  nodule    Dr Arley Bending is monitoring every year    Past Surgical History:  Procedure Laterality Date   ABDOMINAL HYSTERECTOMY  2000   APPENDECTOMY  1976   BALLOON DILATION N/A 11/03/2021   Procedure: Debborah Fairly DILATION;  Surgeon: Genell Ken, MD;  Location: [facility];  Service: Gastroenterology;  Laterality: N/A;   BOTOX  INJECTION N/A 11/03/2021   Procedure: BOTOX  INJECTION;  Surgeon: Genell Ken, MD;  Location: [facility];  Service: Gastroenterology;  Laterality: N/A;   BREAST LUMPECTOMY  1989   lt-negative   CARDIOVASCULAR STRESS TEST  2000   CATARACT EXTRACTION W/PHACO Left 12/07/2019   Procedure: CATARACT EXTRACTION PHACO AND INTRAOCULAR LENS PLACEMENT (IOC);  Surgeon: Tarri Farm, MD;  Location: [facility];  Service: Ophthalmology;  Laterality: Left;  CDE: 5.55   CHOLECYSTECTOMY  1985   COLONOSCOPY     DRUG INDUCED ENDOSCOPY Bilateral 09/29/2022   Procedure: DRUG INDUCED ENDOSCOPY;  Surgeon: Artice Last, MD;  Location: [facility];  Service: ENT;  Laterality: Bilateral;  15 MINUTES   ESOPHAGEAL MANOMETRY N/A 08/24/2021   Procedure: ESOPHAGEAL MANOMETRY (EM);  Surgeon: Genell Ken, MD;  Location: [facility];  Service: Gastroenterology;  Laterality: N/A;   ESOPHAGOGASTRODUODENOSCOPY N/A 11/03/2021   Procedure: ESOPHAGOGASTRODUODENOSCOPY (EGD);  Surgeon: Genell Ken, MD;  Location: [facility];  Service: Gastroenterology;  Laterality: N/A;   EYE SURGERY  2021   HYSTERECTOMY ABDOMINAL WITH SALPINGECTOMY Bilateral 2000   IMPLANTATION OF HYPOGLOSSAL NERVE STIMULATOR Right 11/24/2022   Procedure: IMPLANTATION OF HYPOGLOSSAL NERVE STIMULATOR;  Surgeon: Artice Last, MD;  Location: [facility];  Service: ENT;  Laterality: Right;   KIDNEY STONE SURGERY Right 2014   with stent placement in OR   KNEE ARTHROSCOPY Right 08/22/2013   Procedure: ARTHROSCOPY RIGHT KNEE FOR INFECTION LAVAGE AND DRAINAGE;  Surgeon: Forbes Ida., MD;  Location: [facility];  Service: Orthopedics;  Laterality: Right;   KNEE ARTHROSCOPY WITH PATELLA RECONSTRUCTION Right 08/06/2013   Procedure: RIGHT KNEE ARTHROSCOPY WITH MENISCECTOMY MEDIAL, ARTHROSCOPY KNEE WITH DEBRIDEMENT/SHAVING (CHONDROPLASTY) ;  Surgeon: Forbes Ida., MD;  Location: [facility];  Service: Orthopedics;  Laterality: Right;   LUMBAR LAMINECTOMY/DECOMPRESSION MICRODISCECTOMY Right 09/11/2019   Procedure: Right Lumbar Three-Four Microdiscectomy;  Surgeon: Manya Sells, MD;  Location: [facility];  Service: Neurosurgery;  Laterality: Right;  Right Lumbar Three-Four Microdiscectomy   NASAL SINUS SURGERY     x4   OOPHORECTOMY     SHOULDER SURGERY  2011   right shoulder   SPINE SURGERY  2020   THYROID  SURGERY  1994   TONSILLECTOMY Bilateral 1974    Social History   Socioeconomic History   Marital status: Widowed    Spouse name: Sammie Crigler   Number of children: Not on file   Years of education: Not on file   Highest education level: Bachelor's degree (e.g., BA, AB, BS)  Occupational History   Occupation: retired  Tobacco Use   Smoking status: Never    Passive exposure: Past   Smokeless tobacco: Never  Vaping Use   Vaping status: Never Used  Substance and Sexual Activity   Alcohol  use: Never   Drug use: Never   Sexual activity: Not Currently    Birth control/protection: Post-menopausal    Comment:  Hysterectomy  Other Topics Concern   Not on file  Social History Narrative   Caffiene 4-5 12 oz cans soda.   Working retired - special ed.   Live with home no kids   Social Drivers of Health   Financial Resource Strain: Low Risk  (05/08/2023)   Overall Financial Resource Strain (CARDIA)    Difficulty of Paying Living Expenses: Not very hard  Food Insecurity: No Food Insecurity (05/08/2023)   Hunger Vital Sign    Worried About Running Out of Food in the Last Year: Never true    Ran Out of Food in the Last Year: Never true  Transportation Needs: No Transportation Needs (05/08/2023)   PRAPARE - Administrator, Civil Service (Medical): No    Lack of Transportation (Non-Medical): No  Physical Activity: Inactive (05/08/2023)   Exercise Vital Sign    Days of Exercise per Week: 0 days    Minutes of Exercise per Session: 0 min  Stress: Stress Concern Present (05/08/2023)  [employer] of Occupational Health - Occupational Stress Questionnaire    Feeling of Stress : Rather much  Social Connections: Moderately Isolated (05/08/2023)   Social Connection and Isolation Panel [NHANES]    Frequency of Communication with Friends and Family: More than three times a week    Frequency of Social Gatherings with Friends and Family: More than three times a week    Attends Religious Services: 1 to 4 times per year    Active Member of [employer] or Organizations: No    Attends Banker Meetings: Never    Marital Status: Widowed  Intimate Partner Violence: Not At Risk (02/18/2023)   Humiliation, Afraid, Rape, and Kick questionnaire    Fear of Current or Ex-Partner: No    Emotionally Abused: No    Physically Abused: No    Sexually Abused: No    Outpatient Encounter Medications as of 06/28/2023  Medication Sig   amoxicillin -clavulanate (AUGMENTIN ) 875-125 MG tablet Take 1 tablet by mouth 2 (two) times daily.   baclofen  (LIORESAL ) 10 MG tablet TAKE 1 TABLET BY MOUTH EACH MORNING AND  1 TABLET AT NOON.   budesonide  (PULMICORT ) 0.5 MG/2ML nebulizer solution Take 2 mLs (0.5 mg total) by nebulization in the morning and at bedtime. MIX 1 VIAL WITH 250CC OF NORMAL SALINE FOR SINUS WASH DAILY AS NEEDED   cholecalciferol (VITAMIN D ) 25 MCG (1000 UNIT) tablet Take 1,000 Units by mouth daily.   folic acid  (FOLVITE ) 1 MG tablet TAKE TWO TABLETS BY MOUTH EVERY DAY   gabapentin (NEURONTIN) 300 MG capsule Take 300 mg by mouth 3 (three) times daily.   HYDROcodone -acetaminophen  (NORCO/VICODIN) 5-325 MG tablet Take by mouth.   hydroxychloroquine  (PLAQUENIL ) 200 MG tablet TAKE 1 TABLET TWICE DAILY MONDAY THRU FRIDAY FOR RHEUMATOID ARTHRTITIS.   JARDIANCE 10 MG TABS tablet Take 10 mg by mouth daily.   Lancets (ONETOUCH DELICA PLUS LANCET33G) MISC SMARTSIG:1 Topical Daily   MAGNESIUM  PO Take 500 mg by mouth at bedtime.   methotrexate  50 MG/2ML injection Inject 0.8 mLs (20 mg total) into the skin once a week.   Multiple Vitamin (MULTIVITAMIN WITH MINERALS) TABS tablet Take 1 tablet by mouth daily.   omeprazole (PRILOSEC) 20 MG capsule Take 40 mg by mouth daily.   ondansetron  (ZOFRAN -ODT) 8 MG disintegrating tablet Take 8 mg by mouth every 8 (eight) hours as needed for nausea.    ONETOUCH VERIO test strip 1 each daily.   Polyvinyl Alcohol -Povidone (REFRESH OP) Apply to eye as needed.   predniSONE  (DELTASONE ) 20 MG tablet Take 2 tablets (40 mg total) by mouth daily with breakfast for 5 days.   Rimegepant Sulfate (NURTEC) 75 MG TBDP Take 1 tablet (75 mg total) by mouth daily as needed. NDC [address]-4, lot [id number] exp 07/2026   tiZANidine  (ZANAFLEX ) 4 MG tablet TAKE ONE TABLET BY MOUTH AT BEDTIME AS NEEDED   TUBERCULIN SYR 1CC/27GX1/2" (B-D TB SYRINGE 1CC/27GX1/2") 27G X 1/2" 1 ML MISC 12 Syringes by Does not apply route once a week.   [DISCONTINUED] betamethasone  dipropionate (DIPROLENE ) 0.05 % ointment Apply topically 2 (two) times daily.   [DISCONTINUED] DULoxetine  (CYMBALTA ) 30 MG capsule Take  one capsule daily for one week and than twice daily   [DISCONTINUED] ibuprofen  (ADVIL ) 200 MG tablet Take 400 mg by mouth every 8 (eight) hours as needed for moderate pain.   [DISCONTINUED] levocetirizine (XYZAL  ALLERGY 24HR) 5 MG tablet Take 1 tablet (5 mg total) by mouth every evening.   [DISCONTINUED] methylPREDNISolone  (MEDROL  DOSEPAK) 4 MG TBPK  tablet Take with signs of chronic sinusitis and take as directed   [DISCONTINUED] methylPREDNISolone  (MEDROL  DOSEPAK) 4 MG TBPK tablet Take pills daily all together with food. Take the first dose (6 pills) as soon as possible. Take the rest each morning. For 6 days total 6-5-4-3-2-1.   [DISCONTINUED] ofloxacin  (OCUFLOX ) 0.3 % ophthalmic solution Place 1 drop into the right eye 4 (four) times daily.   [DISCONTINUED] olopatadine  (PATADAY ) 0.1 % ophthalmic solution Place 1 drop into the right eye 2 (two) times daily.   [DISCONTINUED] Rimegepant Sulfate (NURTEC) 75 MG TBDP Take 1 tablet (75 mg total) by mouth daily as needed. For migraines. Take as close to onset of migraine as possible. One daily maximum.   No facility-administered encounter medications on file as of 06/28/2023.    Allergies  Allergen Reactions   Sulfa Antibiotics Anaphylaxis   Sulfamethoxazole-Trimethoprim Anaphylaxis   Atorvastatin Other (See Comments)    Severe Muscle and Joint pain with ALL statins    Codeine Itching   Erythromycin Nausea And Vomiting   Metronidazole  Diarrhea   Morphine And Codeine Itching   Semaglutide Nausea And Vomiting    Ozempic*    Pertinent ROS per HPI, otherwise unremarkable      Objective:  BP 123/74   Pulse 92   Temp (!) 96.8 F (36 C)   Ht 5\' 4"  (1.626 m)   Wt 161 lb 3.2 oz (73.1 kg)   SpO2 94%   BMI 27.67 kg/m    Wt Readings from Last 3 Encounters:  06/28/23 161 lb 3.2 oz (73.1 kg)  05/12/23 162 lb 12.8 oz (73.8 kg)  03/31/23 162 lb (73.5 kg)    Physical Exam Vitals and nursing note reviewed.  Constitutional:      General: She  is not in acute distress.    Appearance: Normal appearance. She is not ill-appearing, toxic-appearing or diaphoretic.  HENT:     Head: Normocephalic and atraumatic.     Right Ear: A middle ear effusion is present. Tympanic membrane is not erythematous.     Left Ear: A middle ear effusion is present. Tympanic membrane is not erythematous.     Nose: Congestion present.     Right Turbinates: Swollen.     Left Turbinates: Swollen.     Right Sinus: Maxillary sinus tenderness present. No frontal sinus tenderness.     Left Sinus: Maxillary sinus tenderness present. No frontal sinus tenderness.     Mouth/Throat:     Lips: Pink.     Mouth: Mucous membranes are moist.     Pharynx: Oropharynx is clear. Postnasal drip present. No posterior oropharyngeal erythema.  Eyes:     Conjunctiva/sclera: Conjunctivae normal.     Pupils: Pupils are equal, round, and reactive to light.  Cardiovascular:     Rate and Rhythm: Normal rate and regular rhythm.     Heart sounds: Normal heart sounds.  Pulmonary:     Effort: Pulmonary effort is normal.     Breath sounds: Normal breath sounds.  Musculoskeletal:     Cervical back: Neck supple.     Right lower leg: No edema.     Left lower leg: No edema.  Skin:    General: Skin is warm and dry.     Capillary Refill: Capillary refill takes less than 2 seconds.  Neurological:     General: No focal deficit present.     Mental Status: She is alert and oriented to person, place, and time.  Psychiatric:  Mood and Affect: Mood normal.        Behavior: Behavior normal.        Thought Content: Thought content normal.        Judgment: Judgment normal.      Results for orders placed or performed in visit on 05/12/23  Bayer DCA Hb A1c Waived   Collection Time: 05/12/23 10:43 AM  Result Value Ref Range   HB A1C (BAYER DCA - WAIVED) 5.9 (H) 4.8 - 5.6 %       Pertinent labs & imaging results that were available during my care of the patient were reviewed by me  and considered in my medical decision making.  Assessment & Plan:  Chellsie was seen today for generalized body aches, nasal congestion and facial pain.  Diagnoses and all orders for this visit:  Generalized headache -     predniSONE  (DELTASONE ) 20 MG tablet; Take 2 tablets (40 mg total) by mouth daily with breakfast for 5 days. -     amoxicillin -clavulanate (AUGMENTIN ) 875-125 MG tablet; Take 1 tablet by mouth 2 (two) times daily.  Myalgia -     predniSONE  (DELTASONE ) 20 MG tablet; Take 2 tablets (40 mg total) by mouth daily with breakfast for 5 days. -     amoxicillin -clavulanate (AUGMENTIN ) 875-125 MG tablet; Take 1 tablet by mouth 2 (two) times daily.  Acute recurrent maxillary sinusitis -     predniSONE  (DELTASONE ) 20 MG tablet; Take 2 tablets (40 mg total) by mouth daily with breakfast for 5 days. -     amoxicillin -clavulanate (AUGMENTIN ) 875-125 MG tablet; Take 1 tablet by mouth 2 (two) times daily.  Nasal congestion -     predniSONE  (DELTASONE ) 20 MG tablet; Take 2 tablets (40 mg total) by mouth daily with breakfast for 5 days. -     amoxicillin -clavulanate (AUGMENTIN ) 875-125 MG tablet; Take 1 tablet by mouth 2 (two) times daily.       Acute sinusitis Acute sinusitis with significant nasal congestion and facial pain, primarily in the maxillary region. Symptoms have persisted since Thursday, with no fever. She is using budesonide  nasal wash twice daily. Examination reveals swollen and congested nasal turbinates, indicating blocked sinuses. Given her immunocompromised status and symptom severity, antibiotics and steroids are warranted to prevent complications. - Prescribe prednisone  40 mg daily for 5 days. - Prescribe amoxicillin  (Augmentin ) twice daily for 10 days. - Coordinate with allergist for further evaluation and management.  Nasal congestion Severe nasal congestion contributing to sinus blockage and facial pain. She has been using budesonide  nasal wash with increased  saline concentration, but symptoms persist. The congestion is likely exacerbating the sinusitis and requires intervention to reduce inflammation and promote drainage. - Continue budesonide  nasal wash twice daily. - Initiate prednisone  40 mg daily for 5 days to reduce inflammation. - Prescribe amoxicillin  (Augmentin ) twice daily for 10 days to address potential bacterial infection.  Allergy to sulfonamides Known allergy to sulfonamides, limiting antibiotic options. She reports intolerance to Keflex, experiencing nausea. Previous treatment with doxycycline  was around Christmas. Current treatment plan avoids sulfonamides and uses Augmentin  instead. - Avoid prescribing sulfonamide antibiotics. - Prescribe amoxicillin  (Augmentin ) twice daily for 10 days as an alternative antibiotic.          Continue all other maintenance medications.  Follow up plan: Return if symptoms worsen or fail to improve.   Continue healthy lifestyle choices, including diet (rich in fruits, vegetables, and lean proteins, and low in salt and simple carbohydrates) and exercise (at least 30 minutes  of moderate physical activity daily).  Educational handout given for sinusitis   The above assessment and management plan was discussed with the patient. The patient verbalized understanding of and has agreed to the management plan. Patient is aware to call the clinic if they develop any new symptoms or if symptoms persist or worsen. Patient is aware when to return to the clinic for a follow-up visit. Patient educated on when it is appropriate to go to the emergency department.   Kattie Parrot, FNP-C [facility] [phone]

## 2023-06-29 NOTE — Telephone Encounter (Signed)
 Signature form faxed back to [employer] one source program at [employer] [facility]. Received a receipt of confirmation. [phone]

## 2023-06-30 DIAGNOSIS — F332 Major depressive disorder, recurrent severe without psychotic features: Secondary | ICD-10-CM | POA: Diagnosis not present

## 2023-06-30 DIAGNOSIS — F411 Generalized anxiety disorder: Secondary | ICD-10-CM | POA: Diagnosis not present

## 2023-07-04 ENCOUNTER — Encounter (HOSPITAL_COMMUNITY): Payer: Self-pay

## 2023-07-04 ENCOUNTER — Other Ambulatory Visit: Payer: Self-pay

## 2023-07-04 ENCOUNTER — Encounter: Payer: Self-pay | Admitting: Dermatology

## 2023-07-04 ENCOUNTER — Ambulatory Visit: Admitting: Dermatology

## 2023-07-04 ENCOUNTER — Ambulatory Visit: Admitting: Allergy

## 2023-07-04 ENCOUNTER — Encounter: Payer: Self-pay | Admitting: Allergy

## 2023-07-04 VITALS — BP 111/73 | HR 79

## 2023-07-04 VITALS — BP 120/78 | HR 98 | Temp 98.1°F | Resp 18 | Ht 62.6 in | Wt 163.6 lb

## 2023-07-04 DIAGNOSIS — G4733 Obstructive sleep apnea (adult) (pediatric): Secondary | ICD-10-CM

## 2023-07-04 DIAGNOSIS — L309 Dermatitis, unspecified: Secondary | ICD-10-CM

## 2023-07-04 DIAGNOSIS — L209 Atopic dermatitis, unspecified: Secondary | ICD-10-CM

## 2023-07-04 DIAGNOSIS — G43709 Chronic migraine without aura, not intractable, without status migrainosus: Secondary | ICD-10-CM

## 2023-07-04 DIAGNOSIS — J329 Chronic sinusitis, unspecified: Secondary | ICD-10-CM | POA: Diagnosis not present

## 2023-07-04 MED ORDER — NURTEC 75 MG PO TBDP: 75.0000 mg | ORAL_TABLET | Freq: Every day | ORAL | Status: DC | PRN

## 2023-07-04 MED ORDER — CARBINOXAMINE MALEATE 4 MG PO TABS: 8.0000 mg | ORAL_TABLET | Freq: Two times a day (BID) | ORAL | 5 refills | Status: DC | PRN

## 2023-07-04 MED ORDER — TACROLIMUS 0.1 % EX OINT: TOPICAL_OINTMENT | Freq: Two times a day (BID) | CUTANEOUS | 6 refills | Status: AC

## 2023-07-04 NOTE — Addendum Note (Signed)
 Addended by: Burns Carwin on: 07/04/2023 04:27 PM   Modules accepted: Orders

## 2023-07-04 NOTE — Patient Instructions (Addendum)
 Hello Drue,  Thank you for visiting today. Here is a summary of the key instructions:  Medications: - Pimecrolimus cream daily on affected areas until symptoms resolve   - Apply [place] Hand Cream by [employer] daily for moisturizing  Lifestyle Changes: - Wash hands in warm water - Wear gloves when cleaning or using harsh chemicals - Avoid fragranced products on skin - Apply fragrance sprays to clothes instead of skin  Follow-up: - Return for skin cancer screening in 3 months - Schedule a follow-up appointment in 4 months to review flares and medication effectiveness  We look forward to seeing the positive changes in your next visit. If you have any questions or concerns before then, please do not hesitate to contact our office.  Warm regards,  Dr. Louana Roup, Dermatology     Important Information   Due to recent changes in healthcare laws, you may see results of your pathology and/or laboratory studies on MyChart before the doctors have had a chance to review them. We understand that in some cases there may be results that are confusing or concerning to you. Please understand that not all results are received at the same time and often the doctors may need to interpret multiple results in order to provide you with the best plan of care or course of treatment. Therefore, we ask that you please give us  2 business days to thoroughly review all your results before contacting the office for clarification. Should we see a critical lab result, you will be contacted sooner.     If You Need Anything After Your Visit   If you have any questions or concerns for your doctor, please call our main line at [phone]. If no one answers, please leave a voicemail as directed and we will return your call as soon as possible. Messages left after 4 pm will be answered the following business day.    You may also send us  a message via MyChart. We typically respond to MyChart messages  within 1-2 business days.  For prescription refills, please ask your pharmacy to contact our office. Our fax number is [phone].  If you have an urgent issue when the clinic is closed that cannot wait until the next business day, you can page your doctor at the number below.     Please note that while we do our best to be available for urgent issues outside of office hours, we are not available 24/7.    If you have an urgent issue and are unable to reach us , you may choose to seek medical care at your doctor's office, retail clinic, urgent care center, or emergency room.   If you have a medical emergency, please immediately call 911 or go to the emergency department. In the event of inclement weather, please call our main line at [phone] for an update on the status of any delays or closures.  Dermatology Medication Tips: Please keep the boxes that topical medications come in in order to help keep track of the instructions about where and how to use these. Pharmacies typically print the medication instructions only on the boxes and not directly on the medication tubes.   If your medication is too expensive, please contact our office at [phone] or send us  a message through MyChart.    We are unable to tell what your co-pay for medications will be in advance as this is different depending on your insurance coverage. However, we may be able to find a substitute medication at  lower cost or fill out paperwork to get insurance to cover a needed medication.    If a prior authorization is required to get your medication covered by your insurance company, please allow us  1-2 business days to complete this process.   Drug prices often vary depending on where the prescription is filled and some pharmacies may offer cheaper prices.   The website www.goodrx.com contains coupons for medications through different pharmacies. The prices here do not account for what the cost may be with help from  insurance (it may be cheaper with your insurance), but the website can give you the price if you did not use any insurance.  - You can print the associated coupon and take it with your prescription to the pharmacy.  - You may also stop by our office during regular business hours and pick up a GoodRx coupon card.  - If you need your prescription sent electronically to a different pharmacy, notify our office through [facility] or by phone at [phone]

## 2023-07-04 NOTE — Progress Notes (Signed)
 New Patient Visit   Subjective  Christina Lewis is a 68 y.o. female who presents for the following: Hand Itching  Patient states she has Itching located at the hands that she would like to have examined. Patient reports the areas have been there for 2 years. She reports the areas are bothersome. Patient reports the areas are very itchy. Patient rates irritation 10 out of 10. She states that the areas have not spread. Patient reports she has previously been treated for these areas. She was prescribed Betamethasone  & Clobetasol. Patient denies Hx of bx. Patient reports family history of psoriasis.  Christina Lewis presents with a history of itchy hands, consistent with atopic dermatitis. The patient reports experiencing recurrent episodes of hand dermatitis characterized by itching and the formation of small blisters.  The condition comes and goes in waves, with the patient noting that it is currently not active. During flare-ups, the patient describes the appearance of little blisters on her hands. In a recent severe episode, the affected area formed a triangular shape, and scarring is visible where she scratched. The patient has attempted treatment with betamethasone  and clobetasol, but found them ineffective in managing her symptoms. She reports that during the last severe flare-up, nothing seemed to alleviate the condition.  The patient identifies several potential triggers for her hand dermatitis, including weather changes, stress, and environmental factors. She notes that cold weather and frequent hand washing can exacerbate the condition. While she sometimes wears gloves for deep cleaning tasks, she does not regularly use them for routine activities like dishwashing. The patient is aware that exposure to cleaning products like Windex and Lysol counter cleaner can be irritating to her skin.   The following portions of the chart were reviewed this encounter and updated as appropriate: medications,  allergies, medical history  Review of Systems:  No other skin or systemic complaints except as noted in HPI or Assessment and Plan.  Objective  Well appearing patient in no apparent distress; mood and affect are within normal limits.  A focused examination was performed of the following areas: B/L Palms of Hands  Relevant exam findings are noted in the Assessment and Plan.              Assessment & Plan   Atopic Dermatitis / HAND DERMATITIS Exam: Scaly pink plaques +/- fissures  Not at goal  Hand Dermatitis is a chronic type of eczema that can come and go on the hands and fingers.  While there is no cure, the rash and symptoms can be managed with topical prescription medications, and for more severe cases, with systemic medications.  Recommend mild soap and routine use of moisturizing cream after handwashing.  Minimize soap/water exposure when possible.      Assessment: Patient presents with recurrent hand dermatitis characterized by itchy, blistering lesions that form triangular patterns and leave visible scars from scratching. Condition is exacerbated by weather changes, stress, and environmental factors such as cold weather and frequent hand washing. Previous treatments with betamethasone  and clobetasol were ineffective. No active lesions observed during today's examination, but historical presentation includes pink erythematous vesicles coalescing into plaques consistent with atopic dermatitis on the hands. The thick skin on hands often makes topical treatments less effective.  - Plan:    Prescribe pimecrolimus cream for daily use     - Patient informed this is a non-steroid option     - Use until symptoms resolve, allow 3-4 weeks to assess effectiveness    Provide samples  of Zoryve (roflumilast) cream     - Explained Zoryve helps repair skin barrier and has anti-inflammatory properties     - Patient educated on insurance coverage issues and potential high cost ($1200) at  regular pharmacies     - If pimecrolimus fails and patient prefers Zoryve, will document trial of pimecrolimus to support coverage    Provide samples of [place] Hand Cream by [employer] for daily moisturizing    Patient education:     - Wash hands in warm water     - Moisturize hands daily     - Avoid fragranced products on skin     - Wear gloves when using cleaning products    Consider Dupixent (dupilumab) injections if topical treatments are unsuccessful    Schedule skin cancer screening in 3 months    No follow-ups on file.  I, Jetta Ager, am acting as Neurosurgeon for [employer], DO.  Documentation: I have reviewed the above documentation for accuracy and completeness, and I agree with the above.  Louana Roup, DO

## 2023-07-04 NOTE — Telephone Encounter (Signed)
 Ok to provide [employer] per Jacqlyn Matas NP. Order placed for 2 sleeves (4 tablets). Sample ready for pickup.

## 2023-07-04 NOTE — Progress Notes (Signed)
 New Patient Note  RE: Christina Lewis MRN: [id number] DOB: 1956/02/13 Date of Office Visit: 07/04/2023  Primary care provider: Galvin Jules, FNP  Chief Complaint: allergies  History of present illness: Christina Lewis is a 68 y.o. female presenting today for evaluation of allergic rhinitis.  Discussed the use of AI scribe software for clinical note transcription with the patient, who gave verbal consent to proceed.  She has a long-standing history of sinus issues dating back to childhood, characterized by persistent nasal congestion and sinus pressure. She has undergone multiple sinus surgeries and has tried various treatments, including nasal sprays and oral medications, but her symptoms persist.  She has undergone allergy testing multiple times since age 27. Initial tests showed significant allergies, leading to several years of allergy injections that improved her symptoms. However, subsequent tests revealed fewer allergies, with the most recent test five years ago was negative.  She was then told she had nonallergic rhinitis.   However he states he continues to have sinus issues.  Her current symptoms include chronic nasal congestion, sometimes causing her nose to be 'literally swollen shut'. She uses budesonide  nasal rinses twice daily and recently completed a course of oral corticosteroids.  She also recently finished a course of Augmentin .  She has also used Flonase , Singulair , and ipratropium nasal spray in the past.  She experiences chronic migraines, complicating her sinus symptoms and making it difficult to distinguish between sinus-related and migraine-related issues.  She has a history of sleep apnea and uses an Inspire implant for management.  She is seen ENT for this who did the placement.  She states her ENT believes she still has allergies and recommended additional allergy testing  She has a history of having an asthmatic type flare some years ago, though she questions  the diagnosis, and experiences episodes of nasal obstruction where she has difficulty breathing through the nose and causes her overall difficulty breathing.  She has a recurrence rash with itchy skin patches and is going to see a dermatologist later today. Her family history includes eczema in her sister and mother.  She has a past history of food allergies, particularly to onions, but these have not been problematic in recent years. She avoids raw onions but can tolerate cooked onions.     Review of systems: 10pt ROS negative unless noted above in HPI  Past medical history: Past Medical History:  Diagnosis Date   Allergy    Anemia    Anxiety    Arthritis    Cataract 2020   Depression    Diabetes mellitus    type 2   Eczema    Fibromyalgia    GERD (gastroesophageal reflux disease)    Gout    no current problem   Hematuria    History of kidney stones    passed stones and also had surgery to remove one   History of tics    Hyperlipidemia 2007   Ineffective esophageal motility    tx with prilosec, essophageal was stretched   Interstitial cystitis 2016   Migraines    Osteoarthritis    PCOS (polycystic ovarian syndrome) 1993   Pneumonia 2019   PONV (postoperative nausea and vomiting)    Recurrent upper respiratory infection (URI)    Sleep apnea    does not use CPAP   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, took medication for a year   Thyroid  nodule    Dr Arley Bending is monitoring every  year    Past surgical history: Past Surgical History:  Procedure Laterality Date   ABDOMINAL HYSTERECTOMY  2000   APPENDECTOMY  1976   BALLOON DILATION N/A 11/03/2021   Procedure: BALLOON DILATION;  Surgeon: Genell Ken, MD;  Location: [facility];  Service: Gastroenterology;  Laterality: N/A;   BOTOX  INJECTION N/A 11/03/2021   Procedure: BOTOX  INJECTION;  Surgeon: Genell Ken, MD;  Location: [facility];  Service: Gastroenterology;  Laterality: N/A;   BREAST LUMPECTOMY   1989   lt-negative   CARDIOVASCULAR STRESS TEST  2000   CATARACT EXTRACTION W/PHACO Left 12/07/2019   Procedure: CATARACT EXTRACTION PHACO AND INTRAOCULAR LENS PLACEMENT (IOC);  Surgeon: Tarri Farm, MD;  Location: [facility];  Service: Ophthalmology;  Laterality: Left;  CDE: 5.55   CHOLECYSTECTOMY  1985   COLONOSCOPY     DRUG INDUCED ENDOSCOPY Bilateral 09/29/2022   Procedure: DRUG INDUCED ENDOSCOPY;  Surgeon: Artice Last, MD;  Location: [facility];  Service: ENT;  Laterality: Bilateral;  15 MINUTES   ESOPHAGEAL MANOMETRY N/A 08/24/2021   Procedure: ESOPHAGEAL MANOMETRY (EM);  Surgeon: Genell Ken, MD;  Location: [facility];  Service: Gastroenterology;  Laterality: N/A;   ESOPHAGOGASTRODUODENOSCOPY N/A 11/03/2021   Procedure: ESOPHAGOGASTRODUODENOSCOPY (EGD);  Surgeon: Genell Ken, MD;  Location: [facility];  Service: Gastroenterology;  Laterality: N/A;   EYE SURGERY  2021   HYSTERECTOMY ABDOMINAL WITH SALPINGECTOMY Bilateral 2000   IMPLANTATION OF HYPOGLOSSAL NERVE STIMULATOR Right 11/24/2022   Procedure: IMPLANTATION OF HYPOGLOSSAL NERVE STIMULATOR;  Surgeon: Artice Last, MD;  Location: [facility];  Service: ENT;  Laterality: Right;   KIDNEY STONE SURGERY Right 2014   with stent placement in OR   KNEE ARTHROSCOPY Right 08/22/2013   Procedure: ARTHROSCOPY RIGHT KNEE FOR INFECTION LAVAGE AND DRAINAGE;  Surgeon: Forbes Ida., MD;  Location: [facility];  Service: Orthopedics;  Laterality: Right;   KNEE ARTHROSCOPY WITH PATELLA RECONSTRUCTION Right 08/06/2013   Procedure: RIGHT KNEE ARTHROSCOPY WITH MENISCECTOMY MEDIAL, ARTHROSCOPY KNEE WITH DEBRIDEMENT/SHAVING (CHONDROPLASTY) ;  Surgeon: Forbes Ida., MD;  Location: [facility];  Service: Orthopedics;  Laterality: Right;   LUMBAR LAMINECTOMY/DECOMPRESSION MICRODISCECTOMY Right 09/11/2019   Procedure: Right Lumbar Three-Four Microdiscectomy;  Surgeon: Manya Sells, MD;  Location: [facility];   Service: Neurosurgery;  Laterality: Right;  Right Lumbar Three-Four Microdiscectomy   NASAL SINUS SURGERY     x4   OOPHORECTOMY     SHOULDER SURGERY  2011   right shoulder   SPINE SURGERY  2020   THYROID  SURGERY  1994   TONSILLECTOMY Bilateral 1974    Family history:  Family History  Problem Relation Age of Onset   Fibromyalgia Mother    Psoriasis Mother        psoriatic arthritis    Diabetes Mother    Heart disease Mother    Arthritis Mother    Asthma Mother    Hyperlipidemia Mother    Hypertension Mother    Stroke Mother    Psoriasis Sister        psoriatic arthritis    Diabetes Sister    Migraines Sister    Rheum arthritis Sister    Fibromyalgia Sister    Diabetes Sister    Arthritis Sister    Asthma Sister    Diabetes Sister    Hyperlipidemia Sister    Miscarriages / [place] Sister    Arthritis Sister    Asthma Sister    Diabetes Sister    Hyperlipidemia Sister    Hypertension  Sister    Miscarriages / [place] Sister     Social history: Lives in a home without carpeting with gas heating and central cooling.  No concern for water damage, mildew or roaches in the home.  There is a dog in the home.  There are cats and dogs outside the home.  She is retired.  She denies a smoking history.   Medication List: Current Outpatient Medications  Medication Sig Dispense Refill   amoxicillin -clavulanate (AUGMENTIN ) 875-125 MG tablet Take 1 tablet by mouth 2 (two) times daily. 20 tablet 0   baclofen  (LIORESAL ) 10 MG tablet TAKE 1 TABLET BY MOUTH EACH MORNING AND 1 TABLET AT NOON. 60 tablet 0   budesonide  (PULMICORT ) 0.5 MG/2ML nebulizer solution Take 2 mLs (0.5 mg total) by nebulization in the morning and at bedtime. MIX 1 VIAL WITH 250CC OF NORMAL SALINE FOR SINUS WASH DAILY AS NEEDED 120 mL 12   cholecalciferol (VITAMIN D ) 25 MCG (1000 UNIT) tablet Take 1,000 Units by mouth daily.     folic acid  (FOLVITE ) 1 MG tablet TAKE TWO TABLETS BY MOUTH EVERY DAY 180  tablet 3   gabapentin (NEURONTIN) 300 MG capsule Take 300 mg by mouth 3 (three) times daily.     HYDROcodone -acetaminophen  (NORCO/VICODIN) 5-325 MG tablet Take by mouth.     hydroxychloroquine  (PLAQUENIL ) 200 MG tablet TAKE 1 TABLET TWICE DAILY MONDAY THRU FRIDAY FOR RHEUMATOID ARTHRTITIS. 120 tablet 0   JARDIANCE 10 MG TABS tablet Take 10 mg by mouth daily.     Lancets (ONETOUCH DELICA PLUS LANCET33G) MISC SMARTSIG:1 Topical Daily     MAGNESIUM  PO Take 500 mg by mouth at bedtime.     methotrexate  50 MG/2ML injection Inject 0.8 mLs (20 mg total) into the skin once a week. 10 mL 0   Multiple Vitamin (MULTIVITAMIN WITH MINERALS) TABS tablet Take 1 tablet by mouth daily.     omeprazole (PRILOSEC) 20 MG capsule Take 40 mg by mouth daily.     ondansetron  (ZOFRAN -ODT) 8 MG disintegrating tablet Take 8 mg by mouth every 8 (eight) hours as needed for nausea.      ONETOUCH VERIO test strip 1 each daily.     Polyvinyl Alcohol -Povidone (REFRESH OP) Apply to eye as needed.     Rimegepant Sulfate (NURTEC) 75 MG TBDP Take 1 tablet (75 mg total) by mouth daily as needed. NDC [address]-7, lot [id number] exp 07/2026     tiZANidine  (ZANAFLEX ) 4 MG tablet TAKE ONE TABLET BY MOUTH AT BEDTIME AS NEEDED 30 tablet 0   TUBERCULIN SYR 1CC/27GX1/2" (B-D TB SYRINGE 1CC/27GX1/2") 27G X 1/2" 1 ML MISC 12 Syringes by Does not apply route once a week. 12 each 3   No current facility-administered medications for this visit.    Known medication allergies: Allergies  Allergen Reactions   Sulfa Antibiotics Anaphylaxis   Sulfamethoxazole-Trimethoprim Anaphylaxis   Atorvastatin Other (See Comments)    Severe Muscle and Joint pain with ALL statins    Codeine Itching   Erythromycin Nausea And Vomiting   Metronidazole  Diarrhea   Morphine And Codeine Itching   Semaglutide Nausea And Vomiting    Ozempic*     Physical examination: Blood pressure 120/78, pulse 98, temperature 98.1 F (36.7 C), temperature source Temporal,  resp. rate 18, height 5' 2.6" (1.59 m), weight 163 lb 9.6 oz (74.2 kg), SpO2 96%.  General: Alert, interactive, in no acute distress. HEENT: PERRLA, TMs pearly gray, turbinates minimally edematous without discharge, post-pharynx non erythematous. Neck: Supple without lymphadenopathy. Lungs:  Clear to auscultation without wheezing, rhonchi or rales. {no increased work of breathing. CV: Normal S1, S2 without murmurs. Abdomen: Nondistended, nontender. Skin: Warm and dry, without lesions or rashes. Extremities:  No clubbing, cyanosis or edema. Neuro:   Grossly intact.  Diagnositics/Labs: None today  Assessment and plan:   Chronic rhinosinusitis Persistent nasal congestion and sinus symptoms. Previous treatments include nasal steroids, Singulair , antihistamines and ipratropium spray.  Several allergy shot courses in lifetime. Current treatment includes budesonide  rinses and recent corticosteroid course.  - Order blood testing for environmental allergens.  If negative, then will get you scheduled for skin testing - Provided sample of Xhance  nasal spray for trial.  Xhance  2 sprays each nostril twice a day for congestion control.   Hold budesonide  rinses while trialing Xhance .  This is a special device that allows for deeper deposition of fluticasone  spray into sinuses.  Let me know if this works better than the rinses.  - Prescribe carbinoxamine 4mg  tabs take 2 tabs twice a day for trial as an alternative antihistamine.  This is a prescription based antihistamine.    Chronic migraine Chronic migraine contributing to nasal and sinus symptoms. Neurologist involved in management.  Sleep apnea with Inspire implant Sleep apnea managed with Inspire implant. No current issues related to sleep apnea. Continue with ENT follow-up  Follow-up in 3-4 months or sooner if needed  I appreciate the opportunity to take part in Flara's care. Please do not hesitate to contact me with  questions.  Sincerely,   Catha Clink, MD Allergy/Immunology [facility]

## 2023-07-04 NOTE — Patient Instructions (Addendum)
 Continue chronic rhinosinusitis Persistent nasal congestion and sinus symptoms. Previous treatments include nasal steroids, Singulair , antihistamines and ipratropium spray.  Several allergy shot courses in lifetime. Current treatment includes budesonide  rinses and recent corticosteroid course.  - Order blood testing for environmental allergens.  If negative, then will get you scheduled for skin testing - Provided sample of Xhance  nasal spray for trial.  Xhance  2 sprays each nostril twice a day for congestion control.   Hold budesonide  rinses while trialing Xhance .  This is a special device that allows for deeper deposition of fluticasone  spray into sinuses.  Let me know if this works better than the rinses.  - Prescribe carbinoxamine 4mg  tabs take 2 tabs twice a day for trial as an alternative antihistamine.  This is a prescription based antihistamine.    Chronic migraine Chronic migraine contributing to nasal and sinus symptoms. Neurologist involved in management.  Sleep apnea with Inspire implant Sleep apnea managed with Inspire implant. No current issues related to sleep apnea.  Follow-up in 3-4 months or sooner if needed

## 2023-07-05 ENCOUNTER — Telehealth: Payer: Self-pay

## 2023-07-05 NOTE — Telephone Encounter (Signed)
*  Asthma/Allergy  Pharmacy Patient Advocate Encounter   Received notification from CoverMyMeds that prior authorization for Carbinoxamine Maleate 4MG  tablets  is required/requested.   Insurance verification completed.   The patient is insured through [facility] ADVANTAGE/RX ADVANCE .   Per test claim: PA required; PA submitted to above mentioned insurance via CoverMyMeds Key/confirmation #/EOC [id number] Status is pending

## 2023-07-06 NOTE — Telephone Encounter (Signed)
 Pharmacy Patient Advocate Encounter  Received notification from [facility] ADVANTAGE/RX ADVANCE that Prior Authorization for Carbinoxamine Maleate 4mg  has been DENIED.  Full denial letter will be uploaded to the media tab. See denial reason below.

## 2023-07-06 NOTE — Telephone Encounter (Signed)
 Forwarding updated message to provider.

## 2023-07-07 DIAGNOSIS — F411 Generalized anxiety disorder: Secondary | ICD-10-CM | POA: Diagnosis not present

## 2023-07-07 DIAGNOSIS — F331 Major depressive disorder, recurrent, moderate: Secondary | ICD-10-CM | POA: Diagnosis not present

## 2023-07-07 NOTE — Telephone Encounter (Signed)
 Additional information sent to plan in an E-Appeal through the same [facility] Key. Pending determination.

## 2023-07-08 ENCOUNTER — Other Ambulatory Visit: Payer: Self-pay | Admitting: Physician Assistant

## 2023-07-08 NOTE — Telephone Encounter (Signed)
 Last Fill: 06/10/2023  Next Visit: 07/21/2023  Last Visit: 03/31/2023  Dx:  Rheumatoid arthritis of multiple sites with negative rheumatoid factor   Current Dose per office note on 03/31/2023: baclofen  as needed for muscle spasms   Okay to refill Baclofen ?

## 2023-07-11 ENCOUNTER — Telehealth: Payer: Self-pay

## 2023-07-11 ENCOUNTER — Other Ambulatory Visit (HOSPITAL_COMMUNITY): Payer: Self-pay

## 2023-07-11 ENCOUNTER — Ambulatory Visit (HOSPITAL_COMMUNITY)
Admission: RE | Admit: 2023-07-11 | Discharge: 2023-07-11 | Disposition: A | Discharge status: Home / Self Care | Source: Ambulatory Visit

## 2023-07-11 ENCOUNTER — Telehealth: Payer: Self-pay | Admitting: *Deleted

## 2023-07-11 DIAGNOSIS — Z1231 Encounter for screening mammogram for malignant neoplasm of breast: Secondary | ICD-10-CM | POA: Diagnosis not present

## 2023-07-11 DIAGNOSIS — G43711 Chronic migraine without aura, intractable, with status migrainosus: Secondary | ICD-10-CM | POA: Diagnosis not present

## 2023-07-11 NOTE — Progress Notes (Unsigned)
 Office Visit Note  Patient: Christina Lewis             Date of Birth: 1955-05-01           MRN: [id number]             PCP: Galvin Jules, FNP Referring: Galvin Jules, FNP Visit Date: 07/21/2023 Occupation: @GUAROCC @  Subjective:  No chief complaint on file.   History of Present Illness: Christina Lewis is a 68 y.o. female with seronegative rheumatoid arthritis and osteoarthritis.  She returns today after her last visit in December 2024.  She was seen a month ago for severe right trochanteric bursa pain and had right trochanteric bursa injection.  She states the injection resolved her discomfort.  She continues to have some lower back pain and right-sided radiculopathy.  She is scheduled to have lumbar spine surgery on August 03, 2023 by Dr. Larrie Po.  She states with the weather change she gets some discomfort in her hands but she has not noticed any joint swelling.  She has been taking Rasuvo  20 mg subcu weekly along with folic acid  and Plaquenil  200 mg p.o. twice a day Monday to Friday without any interruption.  She saw a dermatologist for the lesions on her hand.  She was diagnosed with eczema.  She was given some topical agents which has been helpful.    Activities of Daily Living:  Patient reports morning stiffness for 2-3 hours.   Patient Reports nocturnal pain.  Difficulty dressing/grooming: Denies Difficulty climbing stairs: Reports Difficulty getting out of chair: Reports Difficulty using hands for taps, buttons, cutlery, and/or writing: Reports  Review of Systems  Constitutional:  Positive for fatigue.  HENT:  Positive for mouth sores and mouth dryness.   Eyes:  Positive for pain and dryness. Negative for visual disturbance.  Respiratory:  Negative for shortness of breath.   Cardiovascular:  Negative for chest pain and palpitations.  Gastrointestinal:  Negative for blood in stool, constipation and diarrhea.  Endocrine: Negative for increased urination.  Genitourinary:   Negative for involuntary urination.  Musculoskeletal:  Positive for joint pain, gait problem, joint pain, myalgias, muscle weakness, morning stiffness, muscle tenderness and myalgias. Negative for joint swelling.  Skin:  Positive for color change. Negative for rash, hair loss and sensitivity to sunlight.  Allergic/Immunologic: Positive for susceptible to infections.  Neurological:  Positive for headaches. Negative for dizziness.  Hematological:  Negative for swollen glands.  Psychiatric/Behavioral:  Positive for depressed mood and sleep disturbance. The patient is not nervous/anxious.     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   Vitamin D  deficiency 10/15/2021   Polycystic ovaries 10/15/2021   Migraine without aura and without status migrainosus, not intractable 10/15/2021   BMI 29.0-29.9,adult 10/15/2021   Rheumatoid arthritis (HCC) 03/04/2021   Type 2 diabetes mellitus with obesity (HCC) 03/04/2021   Right foot infection 03/04/2021   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 myelopathy 09/11/2019   Leukocytosis 11/04/2016   Fibromyalgia 02/10/2016   Other fatigue 02/10/2016   Primary osteoarthritis of both hands 02/10/2016   History of migraine 02/10/2016   History of thyroid  nodule 02/10/2016   History of sleep apnea 02/10/2016  History of renal calculi 02/10/2016   Pain in joint, shoulder region 10/02/2010   Adhesive capsulitis of shoulder 10/02/2010   Muscle weakness (generalized) 10/02/2010    Past Medical History:  Diagnosis Date   Achilles tendonitis    right foot   Allergy    Anemia    Anxiety    Arthritis    Cataract 2020    Depression    Diabetes mellitus    type 2   Eczema    Fibromyalgia    GERD (gastroesophageal reflux disease)    Gout    no current problem   Hematuria    History of kidney stones    passed stones and also had surgery to remove one   History of tics    Hyperlipidemia 2007   Ineffective esophageal motility    tx with prilosec, essophageal was stretched   Interstitial cystitis 2016   Migraines    Osteoarthritis    PCOS (polycystic ovarian syndrome) 1993   Plantar fasciitis, bilateral    Pneumonia 2019   PONV (postoperative nausea and vomiting)    Recurrent upper respiratory infection (URI)    Sleep apnea    does not use CPAP   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, took medication for a year   Thyroid  nodule    Dr Arley Bending is monitoring every year    Family History  Problem Relation Age of Onset   Fibromyalgia Mother    Psoriasis Mother        psoriatic arthritis    Diabetes Mother    Heart disease Mother    Arthritis Mother    Asthma Mother    Hyperlipidemia Mother    Hypertension Mother    Stroke Mother    Rheum arthritis Sister    Arthritis Sister    Asthma Sister    Diabetes Sister    Hyperlipidemia Sister    Miscarriages / Stillbirths Sister    Psoriasis Sister        psoriatic arthritis   Arthritis Sister    Asthma Sister    Diabetes Sister    Hyperlipidemia Sister    Hypertension Sister    Miscarriages / Stillbirths Sister    Past Surgical History:  Procedure Laterality Date   ABDOMINAL HYSTERECTOMY  2000   APPENDECTOMY  1976   BALLOON DILATION N/A 11/03/2021   Procedure: Laurie Poplar;  Surgeon: Genell Ken, MD;  Location: [facility];  Service: Gastroenterology;  Laterality: N/A;   BOTOX  INJECTION N/A 11/03/2021   Procedure: BOTOX  INJECTION;  Surgeon: Genell Ken, MD;  Location: [facility];  Service: Gastroenterology;  Laterality: N/A;   BREAST LUMPECTOMY  1989   lt-negative   CARDIOVASCULAR STRESS TEST  2000   CATARACT  EXTRACTION W/PHACO Left 12/07/2019   Procedure: CATARACT EXTRACTION PHACO AND INTRAOCULAR LENS PLACEMENT (IOC);  Surgeon: Tarri Farm, MD;  Location: [facility];  Service: Ophthalmology;  Laterality: Left;  CDE: 5.55   CHOLECYSTECTOMY  1985   COLONOSCOPY     DRUG INDUCED ENDOSCOPY Bilateral 09/29/2022   Procedure: DRUG INDUCED ENDOSCOPY;  Surgeon: Artice Last, MD;  Location: [facility];  Service: ENT;  Laterality: Bilateral;  15 MINUTES   ESOPHAGEAL MANOMETRY N/A 08/24/2021   Procedure: ESOPHAGEAL MANOMETRY (EM);  Surgeon: Genell Ken, MD;  Location: [facility];  Service: Gastroenterology;  Laterality: N/A;   ESOPHAGOGASTRODUODENOSCOPY N/A 11/03/2021   Procedure: ESOPHAGOGASTRODUODENOSCOPY (EGD);  Surgeon: Genell Ken, MD;  Location: [facility];  Service: Gastroenterology;  Laterality: N/A;  EYE SURGERY  2021   HYSTERECTOMY ABDOMINAL WITH SALPINGECTOMY Bilateral 2000   IMPLANTATION OF HYPOGLOSSAL NERVE STIMULATOR Right 11/24/2022   Procedure: IMPLANTATION OF HYPOGLOSSAL NERVE STIMULATOR;  Surgeon: Artice Last, MD;  Location: [facility];  Service: ENT;  Laterality: Right;   KIDNEY STONE SURGERY Right 2014   with stent placement in OR   KNEE ARTHROSCOPY Right 08/22/2013   Procedure: ARTHROSCOPY RIGHT KNEE FOR INFECTION LAVAGE AND DRAINAGE;  Surgeon: Forbes Ida., MD;  Location: [facility];  Service: Orthopedics;  Laterality: Right;   KNEE ARTHROSCOPY WITH PATELLA RECONSTRUCTION Right 08/06/2013   Procedure: RIGHT KNEE ARTHROSCOPY WITH MENISCECTOMY MEDIAL, ARTHROSCOPY KNEE WITH DEBRIDEMENT/SHAVING (CHONDROPLASTY) ;  Surgeon: Forbes Ida., MD;  Location: [facility];  Service: Orthopedics;  Laterality: Right;   LUMBAR LAMINECTOMY/DECOMPRESSION MICRODISCECTOMY Right 09/11/2019   Procedure: Right Lumbar Three-Four Microdiscectomy;  Surgeon: Manya Sells, MD;  Location: [facility];  Service: Neurosurgery;  Laterality: Right;  Right Lumbar  Three-Four Microdiscectomy   NASAL SINUS SURGERY     x4   OOPHORECTOMY     SHOULDER SURGERY  2011   right shoulder   SPINE SURGERY  2020   THYROID  SURGERY  1994   TONSILLECTOMY Bilateral 1974   Social History   Social History Narrative   Caffiene 4-5 12 oz cans soda.   Working retired - special ed.   Live with home no kids   Immunization History  Administered Date(s) Administered   Tdap 03/04/2021   Zoster Recombinant(Shingrix ) 10/15/2021     Objective: Vital Signs: BP 113/76 (BP Location: Left Arm, Patient Position: Sitting, Cuff Size: Normal)   Pulse 77   Resp 15   Ht 5\' 4"  (1.626 m)   Wt 166 lb (75.3 kg)   BMI 28.49 kg/m    Physical Exam Vitals and nursing note reviewed.  Constitutional:      Appearance: She is well-developed.  HENT:     Head: Normocephalic and atraumatic.  Eyes:     Conjunctiva/sclera: Conjunctivae normal.  Cardiovascular:     Rate and Rhythm: Normal rate and regular rhythm.     Heart sounds: Normal heart sounds.  Pulmonary:     Effort: Pulmonary effort is normal.     Breath sounds: Normal breath sounds.  Abdominal:     General: Bowel sounds are normal.     Palpations: Abdomen is soft.  Musculoskeletal:     Cervical back: Normal range of motion.  Lymphadenopathy:     Cervical: No cervical adenopathy.  Skin:    General: Skin is warm and dry.     Capillary Refill: Capillary refill takes less than 2 seconds.  Neurological:     Mental Status: She is alert and oriented to person, place, and time.  Psychiatric:        Behavior: Behavior normal.      Musculoskeletal Exam: She has limited range of motion of the cervical spine with some discomfort.  Thoracic kyphosis was noted without any tenderness.  She has limited range of motion of her lumbar spine with discomfort.  She had tenderness over right SI joint and right trochanteric region.  Shoulders, elbows, wrist joints with good range of motion.  She had no synovitis over MCP joints.   Bilateral PIP and DIP thickening was noted.  CMC thickening and tenderness was noted.  Hip joints were in good range of motion.  Knee joints were in good range of motion without any warmth swelling or effusion.  There was  no tenderness over ankles or MTPs.  CDAI Exam: CDAI Score: -- Patient Global: --; Provider Global: -- Swollen: --; Tender: -- Joint Exam 07/21/2023   No joint exam has been documented for this visit   There is currently no information documented on the homunculus. Go to the Rheumatology activity and complete the homunculus joint exam.  Investigation: No additional findings.  Imaging: MM 3D SCREENING MAMMOGRAM BILATERAL BREAST Result Date: 07/14/2023 CLINICAL DATA:  Screening. EXAM: DIGITAL SCREENING BILATERAL MAMMOGRAM WITH TOMOSYNTHESIS AND CAD TECHNIQUE: Bilateral screening digital craniocaudal and mediolateral oblique mammograms were obtained. Bilateral screening digital breast tomosynthesis was performed. The images were evaluated with computer-aided detection. COMPARISON:  Previous exam(s). ACR Breast Density Category b: There are scattered areas of fibroglandular density. FINDINGS: There are no findings suspicious for malignancy. IMPRESSION: No mammographic evidence of malignancy. A result letter of this screening mammogram will be mailed directly to the patient. RECOMMENDATION: Screening mammogram in one year. (Code:SM-B-01Y) BI-RADS CATEGORY  1: Negative. Electronically Signed   By: Amanda Jungling M.D.   On: 07/14/2023 15:49    Recent Labs: Lab Results  Component Value Date   WBC 12.0 (H) 05/03/2023   HGB 13.9 05/03/2023   PLT 417 (H) 05/03/2023   NA 142 05/03/2023   K 4.9 05/03/2023   CL 107 05/03/2023   CO2 27 05/03/2023   GLUCOSE 80 05/03/2023   BUN 25 05/03/2023   CREATININE 0.76 05/03/2023   BILITOT 0.2 05/03/2023   ALKPHOS 85 02/11/2023   AST 18 05/03/2023   ALT 19 05/03/2023   PROT 6.6 05/03/2023   ALBUMIN 4.6 02/11/2023   CALCIUM 10.1 05/03/2023    GFRAA 98 03/24/2020   QFTBGOLDPLUS NEGATIVE 09/29/2021    Speciality Comments: PLQ Eye Exam: 01/24/2023 WNL @ [facility]     Procedures:  No procedures performed Allergies: Sulfa antibiotics, Sulfamethoxazole-trimethoprim, Atorvastatin, Codeine, Erythromycin, Metronidazole , Morphine and codeine, and Semaglutide   Assessment / Plan:     Visit Diagnoses: Rheumatoid arthritis of multiple sites with negative rheumatoid factor (HCC)-patient states her rheumatoid arthritis is quite well-controlled.  She has not had a flare of rheumatoid arthritis.  As she had some interruption in the methotrexate  treatment due to upper respiratory tract infection about a month ago.  She did not develop a flare of rheumatoid arthritis.  She noticed some discomfort in her hands with the weather change.  She has not noticed any swelling.  She has been taking Plaquenil  regular basis.  History of positive PPD  High risk medication use - Rasuvo  20 mg sq injections once weekly, folic acid  2 mg daily, and Plaquenil  200 mg 1 tablet by mouth twice daily Monday through Friday. PLQ Eye Exam: 01/24/2023.  Patient states that her insurance is covering only 1 tablet of folic acid  daily.  I advised that she may get over-the-counter folic acid  800 mcg and combine it with folic acid  1 mg tablet.  Labs from May 03, 2023 were reviewed CBC and CMP were stable white cell count was elevated due to recent cortisone injection prednisone  use.  Primary osteoarthritis of both hands-she had bilateral CMC PIP and DIP thickening.  No synovitis was noted.  Primary osteoarthritis of right knee-she is often discomfort in her right knee.  No warmth swelling or effusion was noted.  Primary osteoarthritis of both feet-she had noted redness over ankles or MTPs today.  Patient states she had recent flare of Achilles tendinitis and plantar fasciitis which resolved by itself.  Other fatigue-she could not history  of some fatigue.  DDD  (degenerative disc disease), cervical -she has some comfort range of motion.  She denies any radiculopathy.  MRI of the cervical spine revealed mild cervical spondylosis.  Mild left foraminal narrowing at C3-C4 and C5-C6 noted.  Followed by Dr. Larrie Po.  Degeneration of intervertebral disc of lumbar region without discogenic back pain or lower extremity pain-she continues to have lower back pain and right-sided radiculopathy.  She is scheduled to have surgery on August 03, 2023 by Dr. Larrie Po.  I advised her to stop methotrexate  1 week prior to the surgery and may resume 2 weeks after the surgery if there is no infection and she is clearance by the surgeon.  Fibromyalgia -she continues to have some generalized pain and discomfort from fibromyalgia.  She remains on gabapentin and has been taking hydrocodone  for pain relief.  Chronic SI joint pain - Unremarkable x-rays of the pelvis.  Severe multilevel spondylosis and scoliosis of the lumbar spine.  She had good response to SI joint injection in February 2025.  She still has some tenderness.  Trochanteric bursitis of right hip-she was here for right trochanteric bursa injection June 08, 2023 and had a good response to it.  She had mild tenderness over the trochanteric region.  Itching of both hands-she was diagnosed with eczema by her dermatologist.  She states she was given Protopic  0.1% ointment which she has been using twice a day.  She has noticed improvement in the itching.  Other medical problems are listed as follows:  Family history of rheumatoid arthritis-Sister  Family history of psoriatic arthritis-mother and sister  History of diabetes mellitus  Mixed hyperlipidemia  History of gastroesophageal reflux (GERD)  History of sleep apnea  History of depression  History of thyroid  nodule  History of cellulitis  History of renal calculi  History of PCOS  History of tics  History of migraine  Orders: No orders of the defined  types were placed in this encounter.  No orders of the defined types were placed in this encounter.    Follow-Up Instructions: Return for Rheumatoid arthritis, Osteoarthritis.   Nicholas Bari, MD  Note - This record has been created using Animal nutritionist.  Chart creation errors have been sought, but may not always  have been located. Such creation errors do not reflect on  the standard of medical care.

## 2023-07-11 NOTE — Telephone Encounter (Signed)
 Pt  receives nurtec from [facility], but requires PA for re-enrollments.  I do not see that a recent PA as been done.

## 2023-07-11 NOTE — Telephone Encounter (Signed)
Appeal still pending determination.

## 2023-07-11 NOTE — Telephone Encounter (Signed)
 Pharmacy Patient Advocate Encounter   Received notification from Physician's Office that prior authorization for Nurtec 75MG  dispersible tablets is required/requested.   Insurance verification completed.   The patient is insured through [employer] ADVANTAGE/RX ADVANCE .   Per test claim: PA required; PA submitted to above mentioned insurance via CoverMyMeds Key/confirmation #/EOC [id number] Status is pending

## 2023-07-12 ENCOUNTER — Other Ambulatory Visit: Payer: Self-pay | Admitting: Rheumatology

## 2023-07-12 DIAGNOSIS — J329 Chronic sinusitis, unspecified: Secondary | ICD-10-CM | POA: Diagnosis not present

## 2023-07-12 NOTE — Telephone Encounter (Signed)
 Last Fill: 04/15/2023  Labs: 05/03/2023 WBC count is elevated-12.0.  absolute neutrophils are elevated. Platelet count is elevated-417K.  Rest of CBC WNL.     Next Visit: 07/21/2023  Last Visit: 04/20/2023  DX: Rheumatoid arthritis of multiple sites with negative rheumatoid factor   Current Dose per office note 03/31/2023: Rasuvo  20 mg sq injections once weekly   Okay to refill Methotrexate ?

## 2023-07-14 ENCOUNTER — Ambulatory Visit: Payer: Self-pay | Admitting: Family Medicine

## 2023-07-14 LAB — ALLERGENS W/TOTAL IGE AREA 2

## 2023-07-14 NOTE — Telephone Encounter (Signed)
 Outcome Approved on May 13 by [employer] 2017 22-MAY-25:31-DEC-25 Carbinoxamine  Maleate 4MG  OR TABS Quantity:120;

## 2023-07-14 NOTE — Telephone Encounter (Signed)
 Pharmacy Patient Advocate Encounter  Received notification from [facility] ADVANTAGE/RX ADVANCE that Prior Authorization for Nurtec 75MG  dispersible tablets has been DENIED.  Full denial letter will be uploaded to the media tab. See denial reason below.   PA #/Case ID/Reference #: PA Case ID #: [id number]

## 2023-07-14 NOTE — Telephone Encounter (Signed)
 PA nurtec done, denied.  Denial faxed to [facility] Nurtec One source [phone] (received fax confirmation).  [phone].

## 2023-07-19 ENCOUNTER — Ambulatory Visit: Payer: Self-pay | Admitting: Allergy

## 2023-07-20 ENCOUNTER — Other Ambulatory Visit: Payer: Self-pay | Admitting: Rheumatology

## 2023-07-20 NOTE — Telephone Encounter (Signed)
 Last Fill: 06/21/2023   Next Visit: 07/21/2023   Last Visit: 03/31/2023   Dx: Fibromyalgia    Current Dose per office note on 03/31/2023: not mentioned    Okay to refill Tizanidine ?

## 2023-07-21 ENCOUNTER — Encounter: Payer: Self-pay | Admitting: Rheumatology

## 2023-07-21 ENCOUNTER — Ambulatory Visit: Payer: Medicare PPO | Attending: Rheumatology | Admitting: Rheumatology

## 2023-07-21 VITALS — BP 113/76 | HR 77 | Resp 15 | Ht 64.0 in | Wt 166.0 lb

## 2023-07-21 DIAGNOSIS — Z840 Family history of diseases of the skin and subcutaneous tissue: Secondary | ICD-10-CM

## 2023-07-21 DIAGNOSIS — Z8261 Family history of arthritis: Secondary | ICD-10-CM

## 2023-07-21 DIAGNOSIS — Z8719 Personal history of other diseases of the digestive system: Secondary | ICD-10-CM

## 2023-07-21 DIAGNOSIS — G8929 Other chronic pain: Secondary | ICD-10-CM

## 2023-07-21 DIAGNOSIS — Z79899 Other long term (current) drug therapy: Secondary | ICD-10-CM | POA: Diagnosis not present

## 2023-07-21 DIAGNOSIS — M5030 Other cervical disc degeneration, unspecified cervical region: Secondary | ICD-10-CM

## 2023-07-21 DIAGNOSIS — M7061 Trochanteric bursitis, right hip: Secondary | ICD-10-CM

## 2023-07-21 DIAGNOSIS — M19041 Primary osteoarthritis, right hand: Secondary | ICD-10-CM | POA: Diagnosis not present

## 2023-07-21 DIAGNOSIS — E782 Mixed hyperlipidemia: Secondary | ICD-10-CM

## 2023-07-21 DIAGNOSIS — M1711 Unilateral primary osteoarthritis, right knee: Secondary | ICD-10-CM

## 2023-07-21 DIAGNOSIS — Z8639 Personal history of other endocrine, nutritional and metabolic disease: Secondary | ICD-10-CM

## 2023-07-21 DIAGNOSIS — Z8742 Personal history of other diseases of the female genital tract: Secondary | ICD-10-CM

## 2023-07-21 DIAGNOSIS — M19072 Primary osteoarthritis, left ankle and foot: Secondary | ICD-10-CM

## 2023-07-21 DIAGNOSIS — M0609 Rheumatoid arthritis without rheumatoid factor, multiple sites: Secondary | ICD-10-CM | POA: Diagnosis not present

## 2023-07-21 DIAGNOSIS — Z8669 Personal history of other diseases of the nervous system and sense organs: Secondary | ICD-10-CM

## 2023-07-21 DIAGNOSIS — Z8659 Personal history of other mental and behavioral disorders: Secondary | ICD-10-CM

## 2023-07-21 DIAGNOSIS — Z87442 Personal history of urinary calculi: Secondary | ICD-10-CM

## 2023-07-21 DIAGNOSIS — R5383 Other fatigue: Secondary | ICD-10-CM

## 2023-07-21 DIAGNOSIS — M533 Sacrococcygeal disorders, not elsewhere classified: Secondary | ICD-10-CM | POA: Diagnosis not present

## 2023-07-21 DIAGNOSIS — M19071 Primary osteoarthritis, right ankle and foot: Secondary | ICD-10-CM | POA: Diagnosis not present

## 2023-07-21 DIAGNOSIS — M19042 Primary osteoarthritis, left hand: Secondary | ICD-10-CM

## 2023-07-21 DIAGNOSIS — L299 Pruritus, unspecified: Secondary | ICD-10-CM

## 2023-07-21 DIAGNOSIS — M797 Fibromyalgia: Secondary | ICD-10-CM | POA: Diagnosis not present

## 2023-07-21 DIAGNOSIS — Z9289 Personal history of other medical treatment: Secondary | ICD-10-CM

## 2023-07-21 DIAGNOSIS — M51369 Other intervertebral disc degeneration, lumbar region without mention of lumbar back pain or lower extremity pain: Secondary | ICD-10-CM | POA: Diagnosis not present

## 2023-07-21 DIAGNOSIS — Z872 Personal history of diseases of the skin and subcutaneous tissue: Secondary | ICD-10-CM

## 2023-07-21 NOTE — Patient Instructions (Addendum)
 Standing Labs We placed an order today for your standing lab work.   Please have your standing labs drawn in June and every 3 months  Please have your labs drawn 2 weeks prior to your appointment so that the provider can discuss your lab results at your appointment, if possible.  Please note that you may see your imaging and lab results in MyChart before we have reviewed them. We will contact you once all results are reviewed. Please allow our office up to 72 hours to thoroughly review all of the results before contacting the office for clarification of your results.  WALK-IN LAB HOURS  Monday through Thursday from 8:00 am -12:30 pm and 1:00 pm-4:00 pm and Friday from 8:00 am-12:00 pm.  Patients with office visits requiring labs will be seen before walk-in labs.  You may encounter longer than normal wait times. Please allow additional time. Wait times may be shorter on  Monday and Thursday afternoons.  We do not book appointments for walk-in labs. We appreciate your patience and understanding with our staff.   Labs are drawn by [facility]. Please bring your co-pay at the time of your lab draw.  You may receive a bill from [facility] for your lab work.  Please note if you are on Hydroxychloroquine  and and an order has been placed for a Hydroxychloroquine  level,  you will need to have it drawn 4 hours or more after your last dose.  If you wish to have your labs drawn at another location, please call the office 24 hours in advance so we can fax the orders.  The office is located at [address], [address], [place], [place] [address]   If you have any questions regarding directions or hours of operation,  please call [phone].   As a reminder, please drink plenty of water prior to coming for your lab work. Thanks!   Vaccines You are taking a medication(s) that can suppress your immune system.  The following immunizations are recommended: Flu annually Covid-19  RSV Td/Tdap (tetanus,  diphtheria, pertussis) every 10 years Pneumonia (Prevnar 15 then Pneumovax 23 at least 1 year apart.  Alternatively, can take Prevnar 20 without needing additional dose) Shingrix : 2 doses from 4 weeks to 6 months apart  Please check with your PCP to make sure you are up to date.   If you have signs or symptoms of an infection or start antibiotics: First, call your PCP for workup of your infection. Hold your medication through the infection, until you complete your antibiotics, and until symptoms resolve if you take the following: Injectable medication (Actemra, Benlysta, Cimzia, Cosentyx, Enbrel, Humira, Kevzara, Orencia, Remicade, Simponi, Stelara, Taltz, Tremfya) Methotrexate  Leflunomide (Arava) Mycophenolate (Cellcept) Cloria Danger, Olumiant, or Rinvoq   Please stop methotrexate  1 week prior to the surgery and may resume 2 weeks after the surgery if you have clearance from the surgeon.

## 2023-07-22 ENCOUNTER — Encounter: Payer: Self-pay | Admitting: Adult Health

## 2023-07-22 ENCOUNTER — Ambulatory Visit: Payer: Self-pay

## 2023-07-22 NOTE — Telephone Encounter (Signed)
 Chief Complaint: eye pain Symptoms: eye pain Frequency: x 1 days Pertinent Negatives: Patient denies  Disposition: [x] ED /[] Urgent Care (no appt availability in office) / [] Appointment(In office/virtual)/ []  [facility]/ [] Home Care/ [] Refused Recommended Disposition /[] [facility]/ []  Follow-up with PCP  Additional Notes: pt states that has happened before. States she woke up this morning and if felt like someone was sticking something in her right eye and her vision is blurry. Pt states she was seen in UC for same thing in April and had an ulcer. Pt states that pain is a litter better but now is around 4-5. States feels like a something is in her eye. State pain was an 8/10 last night.  Copied from CRM [id number]. Topic: Clinical - Red Word Triage >> Jul 22, 2023  9:23 AM Turkey B wrote: [facility] that prompted transfer to Nurse Triage: severe pain in right eye Reason for Disposition  [1] Blurred vision AND [2] new or worsening  Protocols used: Eye Pain and Other Symptoms-A-AH

## 2023-07-25 ENCOUNTER — Ambulatory Visit: Admitting: Allergy

## 2023-07-25 ENCOUNTER — Encounter: Payer: Self-pay | Admitting: Allergy

## 2023-07-25 DIAGNOSIS — J329 Chronic sinusitis, unspecified: Secondary | ICD-10-CM

## 2023-07-25 DIAGNOSIS — F331 Major depressive disorder, recurrent, moderate: Secondary | ICD-10-CM | POA: Diagnosis not present

## 2023-07-25 DIAGNOSIS — F411 Generalized anxiety disorder: Secondary | ICD-10-CM | POA: Diagnosis not present

## 2023-07-25 NOTE — Progress Notes (Signed)
 Follow-up Note  RE: Christina Lewis MRN: [id number] DOB: 02-04-1956 Date of Office Visit: 07/25/2023   History of present illness: Christina Lewis is a 68 y.o. female presenting today for skin testing visit.  She was last seen in the office on 07/04/23 for CRS.  She is in her usual state of health today without recent illness.  She has held antihistamines for at least 3 days for testing today.  Her environmental allergy panel via serum IgE was negative.    Medication List: Current Outpatient Medications  Medication Sig Dispense Refill   baclofen  (LIORESAL ) 10 MG tablet TAKE 1 TABLET BY MOUTH EACH MORNING AND 1 TABLET AT NOON. 60 tablet 0   budesonide  (PULMICORT ) 0.5 MG/2ML nebulizer solution Take 2 mLs (0.5 mg total) by nebulization in the morning and at bedtime. MIX 1 VIAL WITH 250CC OF NORMAL SALINE FOR SINUS WASH DAILY AS NEEDED 120 mL 12   Carbinoxamine  Maleate 4 MG TABS Take 2 tablets (8 mg total) by mouth 2 (two) times daily as needed (allergies). 120 tablet 5   cholecalciferol (VITAMIN D ) 25 MCG (1000 UNIT) tablet Take 1,000 Units by mouth daily.     Eptinezumab -jjmr (VYEPTI  IV) Inject 300 mg into the vein every 3 (three) months.     folic acid  (FOLVITE ) 1 MG tablet TAKE TWO TABLETS BY MOUTH EVERY DAY 180 tablet 3   gabapentin (NEURONTIN) 300 MG capsule Take 300 mg by mouth 3 (three) times daily.     HYDROcodone -acetaminophen  (NORCO/VICODIN) 5-325 MG tablet Take by mouth.     hydroxychloroquine  (PLAQUENIL ) 200 MG tablet TAKE 1 TABLET TWICE DAILY MONDAY THRU FRIDAY FOR RHEUMATOID ARTHRTITIS. 120 tablet 0   JARDIANCE 10 MG TABS tablet Take 10 mg by mouth daily.     Lancets (ONETOUCH DELICA PLUS LANCET33G) MISC SMARTSIG:1 Topical Daily     MAGNESIUM  PO Take 500 mg by mouth at bedtime.     methotrexate  50 MG/2ML injection Inject 0.8 mLs (20 mg total) into the skin once a week. 10 mL 0   Multiple Vitamin (MULTIVITAMIN WITH MINERALS) TABS tablet Take 1 tablet by mouth daily.      omeprazole (PRILOSEC) 20 MG capsule Take 40 mg by mouth daily.     ondansetron  (ZOFRAN -ODT) 8 MG disintegrating tablet Take 8 mg by mouth every 8 (eight) hours as needed for nausea.      ONETOUCH VERIO test strip 1 each daily.     Polyvinyl Alcohol -Povidone (REFRESH OP) Apply to eye as needed.     Rimegepant Sulfate (NURTEC) 75 MG TBDP Take 1 tablet (75 mg total) by mouth daily as needed. NDC [address]-7, lot [id number] exp 09/2025 4 tablet    tacrolimus  (PROTOPIC ) 0.1 % ointment Apply topically 2 (two) times daily. 60 g 6   tiZANidine  (ZANAFLEX ) 4 MG tablet TAKE ONE TABLET BY MOUTH AT BEDTIME AS NEEDED 30 tablet 0   TUBERCULIN SYR 1CC/27GX1/2" (B-D TB SYRINGE 1CC/27GX1/2") 27G X 1/2" 1 ML MISC 12 Syringes by Does not apply route once a week. 12 each 3   No current facility-administered medications for this visit.     Known medication allergies: Allergies  Allergen Reactions   Sulfa Antibiotics Anaphylaxis   Sulfamethoxazole-Trimethoprim Anaphylaxis   Atorvastatin Other (See Comments)    Severe Muscle and Joint pain with ALL statins    Codeine Itching   Erythromycin Nausea And Vomiting   Metronidazole  Diarrhea   Morphine And Codeine Itching   Semaglutide Nausea And Vomiting    Ozempic*  Diagnositics/Labs: Labs:  Component     Latest Ref Rng 07/12/2023  IgE (Immunoglobulin E), Serum     6 - 495 IU/mL 30   D Pteronyssinus IgE     Class 0 kU/L <0.10   D Farinae IgE     Class 0 kU/L <0.10   Cat Dander IgE     Class 0 kU/L <0.10   Dog Dander IgE     Class 0 kU/L <0.10   Mouse Urine IgE     Class 0 kU/L <0.10   [place] Grass IgE     Class 0 kU/L <0.10   Timothy Grass IgE     Class 0 kU/L <0.10   Johnson Grass IgE     Class 0 kU/L <0.10   Cockroach, German IgE     Class 0 kU/L <0.10   Penicillium Chrysogen IgE     Class 0 kU/L <0.10   Cladosporium Herbarum IgE     Class 0 kU/L <0.10   Aspergillus Fumigatus IgE     Class 0 kU/L <0.10   Alternaria Alternata IgE      Class 0 kU/L <0.10   Maple/Box Elder IgE     Class 0 kU/L <0.10   Common Silver Amelia Jurist IgE     Class 0 kU/L <0.10   Cedar, [place] IgE     Class 0 kU/L <0.10   Oak, White IgE     Class 0 kU/L <0.10   Elm, American IgE     Class 0 kU/L <0.10   Cottonwood IgE     Class 0 kU/L <0.10   Pecan, Hickory IgE     Class 0 kU/L <0.10   White Mulberry IgE     Class 0 kU/L <0.10   Ragweed, Short IgE     Class 0 kU/L <0.10   Pigweed, Rough IgE     Class 0 kU/L <0.10   Sheep Sorrel IgE Qn     Class 0 kU/L <0.10     Allergy testing:   Airborne Adult Perc - 07/25/23 1351     Time Antigen Placed 1351    Allergen Manufacturer Greer    Location Back    Number of Test 55    Panel 1 Select    1. Control-Buffer 50% Glycerol Negative    2. Control-Histamine 2+    3. Bahia Negative    4. [place] Negative    5. Johnson 2+    6. [place]  Blue Negative    7. Meadow Fescue 2+    8. Perennial Rye Negative    9. Timothy Negative    10. Ragweed Mix Negative    11. Cocklebur 2+    12. Plantain,  English Negative    13. Baccharis 2+    14. Dog Fennel Negative    15. Russian Thistle Negative    16. Lamb's Quarters Negative    17. Sheep Sorrell Negative    18. Rough Pigweed 2+    19. Marsh Elder, Rough 2+    20. Mugwort, Common 2+    21. Box, Elder Negative    22. Cedar, red 2+    23. Sweet Gum Negative    24. Pecan Pollen Negative    25. Pine Mix Negative    26. Walnut, Black Pollen Negative    27. Red Mulberry 2+    28. Ash Mix Negative    29. Birch Mix 2+    30. Beech American Negative    31. Cottonwood, [place] Negative  32. Hickory, White Negative    33. Maple Mix Negative    34. Oak, [place] Mix Negative    35. Sycamore Eastern 2+    36. Alternaria Alternata Negative    37. Cladosporium Herbarum Negative    38. Aspergillus Mix Negative    39. Penicillium Mix Negative    40. Bipolaris Sorokiniana (Helminthosporium) Negative    41. Drechslera Spicifera (Curvularia) Negative     42. Mucor Plumbeus Negative    43. Fusarium Moniliforme 2+    44. Aureobasidium Pullulans (pullulara) Negative    45. Rhizopus Oryzae Negative    46. Botrytis Cinera Negative    47. Epicoccum Nigrum Negative    48. Phoma Betae Negative    49. Dust Mite Mix Negative    50. Cat Hair 10,000 BAU/ml 2+    51.  Dog Epithelia Negative    52. Mixed Feathers Negative    53. Horse Epithelia Negative    54. Cockroach, German Negative    55. Tobacco Leaf Negative             Allergy testing results were read and interpreted by provider, documented by clinical staff.   Assessment and plan:   Chronic rhinosinusitis Persistent nasal congestion and sinus symptoms. Previous treatments include nasal steroids, Singulair , antihistamines and ipratropium spray.  Several allergy shot courses in lifetime. Current treatment includes budesonide  rinses and recent corticosteroid course.  - Environmental allergen panel via bloodwork was negative. - Environmental allergen panel via skin testing today is reactive to grasses, weeds, trees, outdoor mold, cat.  Allergen avoidance measures provided.   - Trial of Xhance  did not help nasal/sinus symptoms much thus can resume budesonide  rinses.     - Use carbinoxamine  4mg  tabs take 2 tabs twice a day for trial as an alternative antihistamine.  This is a prescription based antihistamine.   - You can consider allergy shots course again.  Allergy shots "re-train" and "reset" the immune system to ignore environmental allergens and decrease the resulting immune response to those allergens (sneezing, itchy watery eyes, runny nose, nasal congestion, etc).   Allergy shots improve symptoms in 75-85% of patients. We can discuss more at future appointment if the medications are not working for you.  Chronic migraine Chronic migraine contributing to nasal and sinus symptoms. Neurologist involved in management.  Sleep apnea with Inspire implant Sleep apnea managed with  Inspire implant. No current issues related to sleep apnea.  Follow-up in 3-4 months or sooner if needed  I appreciate the opportunity to take part in Temia's care. Please do not hesitate to contact me with questions.  Sincerely,   Catha Clink, MD Allergy/Immunology [facility]

## 2023-07-25 NOTE — Telephone Encounter (Signed)
 In order for patient to get Nurtec through the patient assistance program, she needs to have an appeal (that is approved or denied). Patient had told Raynelle Callow last year that Ubrelvy  samples did not work (see phone note from 04/06/22) plus she states Ubrelvy  is too expensive. Can we send in an appeal?

## 2023-07-25 NOTE — Patient Instructions (Addendum)
 Chronic rhinosinusitis Persistent nasal congestion and sinus symptoms. Previous treatments include nasal steroids, Singulair , antihistamines and ipratropium spray.  Several allergy shot courses in lifetime. Current treatment includes budesonide  rinses and recent corticosteroid course.  - Environmental allergen panel via bloodwork was negative. - Environmental allergen panel via skin testing today is reactive to grasses, weeds, trees, outdoor mold, cat.  Allergen avoidance measures provided.   - Trial of Xhance  did not help nasal/sinus symptoms much thus can resume budesonide  rinses.     - Use carbinoxamine  4mg  tabs take 2 tabs twice a day for trial as an alternative antihistamine.  This is a prescription based antihistamine.   - You can consider allergy shots course again.  Allergy shots "re-train" and "reset" the immune system to ignore environmental allergens and decrease the resulting immune response to those allergens (sneezing, itchy watery eyes, runny nose, nasal congestion, etc).   Allergy shots improve symptoms in 75-85% of patients. We can discuss more at future appointment if the medications are not working for you.  Chronic migraine Chronic migraine contributing to nasal and sinus symptoms. Neurologist involved in management.  Sleep apnea with Inspire implant Sleep apnea managed with Inspire implant. No current issues related to sleep apnea.  Follow-up in 3-4 months or sooner if needed

## 2023-07-25 NOTE — Telephone Encounter (Signed)
 I will forward to the pharmacist for an appeals review.

## 2023-07-26 ENCOUNTER — Telehealth: Payer: Self-pay | Admitting: Pharmacist

## 2023-07-26 NOTE — Telephone Encounter (Signed)
 An E-Appeal has been submitted for Nurtec. Will advise when response is received, please be advised that most companies may take 30 days to make a decision. Appeal letter and supporting documentation have been submitted via CMM on 07/26/2023 @1 :04 pm.  Thank you, Dene Fines, PharmD Clinical Pharmacist  [place]  Direct Dial: [phone]

## 2023-08-01 NOTE — Telephone Encounter (Signed)
 The appeal for Nurtec has been approved by the insurance company:    Thank you, Dene Fines, PharmD Clinical Pharmacist  [place]  Direct Dial: [phone]

## 2023-08-01 NOTE — Telephone Encounter (Signed)
 Great, thanks! I faxed the letter in media to the Nurtec one source program.

## 2023-08-01 NOTE — Telephone Encounter (Signed)
 Nurtec appeal approved through 02/22/24. Appeal approval letter faxed to [employer] for pt's assistance application. Received a receipt of confirmation. Fax # [phone]

## 2023-08-03 DIAGNOSIS — E1140 Type 2 diabetes mellitus with diabetic neuropathy, unspecified: Secondary | ICD-10-CM | POA: Diagnosis not present

## 2023-08-03 DIAGNOSIS — Z9889 Other specified postprocedural states: Secondary | ICD-10-CM | POA: Diagnosis not present

## 2023-08-03 DIAGNOSIS — M48062 Spinal stenosis, lumbar region with neurogenic claudication: Secondary | ICD-10-CM | POA: Diagnosis not present

## 2023-08-03 DIAGNOSIS — M48061 Spinal stenosis, lumbar region without neurogenic claudication: Secondary | ICD-10-CM | POA: Diagnosis not present

## 2023-08-15 ENCOUNTER — Other Ambulatory Visit: Payer: Self-pay | Admitting: *Deleted

## 2023-08-15 DIAGNOSIS — Z79899 Other long term (current) drug therapy: Secondary | ICD-10-CM

## 2023-08-15 DIAGNOSIS — F411 Generalized anxiety disorder: Secondary | ICD-10-CM | POA: Diagnosis not present

## 2023-08-15 DIAGNOSIS — F332 Major depressive disorder, recurrent severe without psychotic features: Secondary | ICD-10-CM | POA: Diagnosis not present

## 2023-08-16 ENCOUNTER — Ambulatory Visit: Payer: Self-pay | Admitting: Physician Assistant

## 2023-08-16 LAB — COMPREHENSIVE METABOLIC PANEL WITH GFR
AG Ratio: 2.3 (calc) (ref 1.0–2.5)
ALT: 18 U/L (ref 6–29)
AST: 16 U/L (ref 10–35)
Albumin: 4.6 g/dL (ref 3.6–5.1)
Alkaline phosphatase (APISO): 65 U/L (ref 37–153)
BUN: 21 mg/dL (ref 7–25)
CO2: 28 mmol/L (ref 20–32)
Calcium: 9.9 mg/dL (ref 8.6–10.4)
Chloride: 103 mmol/L (ref 98–110)
Creat: 0.72 mg/dL (ref 0.50–1.05)
Globulin: 2 g/dL (ref 1.9–3.7)
Glucose, Bld: 118 mg/dL — ABNORMAL HIGH (ref 65–99)
Potassium: 4.6 mmol/L (ref 3.5–5.3)
Sodium: 139 mmol/L (ref 135–146)
Total Bilirubin: 0.2 mg/dL (ref 0.2–1.2)
Total Protein: 6.6 g/dL (ref 6.1–8.1)
eGFR: 92 mL/min/{1.73_m2} (ref >=60)

## 2023-08-16 LAB — CBC WITH DIFFERENTIAL/PLATELET
Absolute Lymphocytes: 3104 {cells}/uL (ref 850–3900)
Absolute Monocytes: 913 {cells}/uL (ref 200–950)
Basophils Absolute: 216 {cells}/uL — ABNORMAL HIGH (ref 0–200)
Basophils Relative: 1.3 %
Eosinophils Absolute: 133 {cells}/uL (ref 15–500)
Eosinophils Relative: 0.8 %
HCT: 41.8 % (ref 35.0–45.0)
Hemoglobin: 13.9 g/dL (ref 11.7–15.5)
MCH: 31.5 pg (ref 27.0–33.0)
MCHC: 33.3 g/dL (ref 32.0–36.0)
MCV: 94.8 fL (ref 80.0–100.0)
MPV: 10.1 fL (ref 7.5–12.5)
Monocytes Relative: 5.5 %
Neutro Abs: 12234 {cells}/uL — ABNORMAL HIGH (ref 1500–7800)
Neutrophils Relative %: 73.7 %
Platelets: 399 10*3/uL (ref 140–400)
RBC: 4.41 10*6/uL (ref 3.80–5.10)
RDW: 13 % (ref 11.0–15.0)
Total Lymphocyte: 18.7 %
WBC: 16.6 10*3/uL — ABNORMAL HIGH (ref 3.8–10.8)

## 2023-08-16 NOTE — Progress Notes (Signed)
 Glucose is 118. Rest of CMP WNL.   WBC count is elevated-16.6. absolute neutrophils and basophils are elevated--please clarify if she has had any recent infections?

## 2023-08-17 DIAGNOSIS — G4733 Obstructive sleep apnea (adult) (pediatric): Secondary | ICD-10-CM | POA: Diagnosis not present

## 2023-08-17 DIAGNOSIS — F5104 Psychophysiologic insomnia: Secondary | ICD-10-CM | POA: Diagnosis not present

## 2023-08-17 DIAGNOSIS — E119 Type 2 diabetes mellitus without complications: Secondary | ICD-10-CM | POA: Diagnosis not present

## 2023-08-18 DIAGNOSIS — F411 Generalized anxiety disorder: Secondary | ICD-10-CM | POA: Diagnosis not present

## 2023-08-18 DIAGNOSIS — F331 Major depressive disorder, recurrent, moderate: Secondary | ICD-10-CM | POA: Diagnosis not present

## 2023-08-22 ENCOUNTER — Other Ambulatory Visit: Payer: Self-pay | Admitting: *Deleted

## 2023-08-22 MED ORDER — BACLOFEN 10 MG PO TABS: ORAL_TABLET | ORAL | 0 refills | Status: DC

## 2023-08-22 MED ORDER — TIZANIDINE HCL 4 MG PO TABS: 4.0000 mg | ORAL_TABLET | Freq: Every evening | ORAL | 0 refills | Status: DC | PRN

## 2023-08-22 NOTE — Telephone Encounter (Signed)
 Last Fill: 07/10/2023 (Baclofen ), 07/20/2023 (Tizanidine )   Next Visit: 12/22/2023  Last Visit: 07/21/2023  Dx: Fibromyalgia   Current Dose per office note on 07/21/2023: not discussed  Okay to refill Tizanidine  and Baclofen ?

## 2023-08-23 DIAGNOSIS — E559 Vitamin D deficiency, unspecified: Secondary | ICD-10-CM | POA: Diagnosis not present

## 2023-08-23 DIAGNOSIS — E1165 Type 2 diabetes mellitus with hyperglycemia: Secondary | ICD-10-CM | POA: Diagnosis not present

## 2023-08-23 DIAGNOSIS — E049 Nontoxic goiter, unspecified: Secondary | ICD-10-CM | POA: Diagnosis not present

## 2023-08-23 DIAGNOSIS — E7800 Pure hypercholesterolemia, unspecified: Secondary | ICD-10-CM | POA: Diagnosis not present

## 2023-08-25 ENCOUNTER — Other Ambulatory Visit (HOSPITAL_COMMUNITY): Payer: Self-pay | Admitting: Endocrinology

## 2023-08-25 DIAGNOSIS — Z789 Other specified health status: Secondary | ICD-10-CM | POA: Diagnosis not present

## 2023-08-25 DIAGNOSIS — G720 Drug-induced myopathy: Secondary | ICD-10-CM | POA: Diagnosis not present

## 2023-08-25 DIAGNOSIS — E1165 Type 2 diabetes mellitus with hyperglycemia: Secondary | ICD-10-CM

## 2023-08-25 DIAGNOSIS — R1310 Dysphagia, unspecified: Secondary | ICD-10-CM | POA: Diagnosis not present

## 2023-08-25 DIAGNOSIS — E049 Nontoxic goiter, unspecified: Secondary | ICD-10-CM | POA: Diagnosis not present

## 2023-08-25 DIAGNOSIS — E7800 Pure hypercholesterolemia, unspecified: Secondary | ICD-10-CM | POA: Diagnosis not present

## 2023-08-25 DIAGNOSIS — E559 Vitamin D deficiency, unspecified: Secondary | ICD-10-CM | POA: Diagnosis not present

## 2023-08-25 DIAGNOSIS — E282 Polycystic ovarian syndrome: Secondary | ICD-10-CM | POA: Diagnosis not present

## 2023-09-02 ENCOUNTER — Ambulatory Visit (HOSPITAL_COMMUNITY)
Admission: RE | Admit: 2023-09-02 | Discharge: 2023-09-02 | Disposition: A | Discharge status: Home / Self Care | Payer: Self-pay | Source: Ambulatory Visit | Attending: Endocrinology | Admitting: Endocrinology

## 2023-09-02 ENCOUNTER — Encounter: Payer: Self-pay | Admitting: *Deleted

## 2023-09-02 DIAGNOSIS — E1165 Type 2 diabetes mellitus with hyperglycemia: Secondary | ICD-10-CM | POA: Insufficient documentation

## 2023-09-06 ENCOUNTER — Other Ambulatory Visit: Payer: Self-pay | Admitting: *Deleted

## 2023-09-06 DIAGNOSIS — M0609 Rheumatoid arthritis without rheumatoid factor, multiple sites: Secondary | ICD-10-CM

## 2023-09-06 NOTE — Telephone Encounter (Signed)
 Patient contacted the office and requested a refill on PLQ.  Last Fill: 06/13/2023  Eye exam: 01/24/2023 WNL   Labs: 08/15/2023 Glucose is 118. Rest of CMP WNL.   WBC count is elevated-16.6. 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   Okay to refill Plaquenil ?

## 2023-09-07 DIAGNOSIS — F331 Major depressive disorder, recurrent, moderate: Secondary | ICD-10-CM | POA: Diagnosis not present

## 2023-09-07 DIAGNOSIS — F411 Generalized anxiety disorder: Secondary | ICD-10-CM | POA: Diagnosis not present

## 2023-09-07 MED ORDER — HYDROXYCHLOROQUINE SULFATE 200 MG PO TABS: ORAL_TABLET | ORAL | 0 refills | Status: DC

## 2023-09-13 ENCOUNTER — Ambulatory Visit: Attending: Rheumatology | Admitting: Rheumatology

## 2023-09-13 VITALS — BP 113/75

## 2023-09-13 DIAGNOSIS — M533 Sacrococcygeal disorders, not elsewhere classified: Secondary | ICD-10-CM | POA: Diagnosis not present

## 2023-09-13 DIAGNOSIS — G8929 Other chronic pain: Secondary | ICD-10-CM | POA: Diagnosis not present

## 2023-09-13 MED ORDER — LIDOCAINE HCL 1 % IJ SOLN
1.0000 mL | INTRAMUSCULAR | Status: AC | PRN
  Administered 2023-09-13: 1 mL

## 2023-09-13 MED ORDER — TRIAMCINOLONE ACETONIDE 40 MG/ML IJ SUSP
40.0000 mg | INTRAMUSCULAR | Status: AC | PRN
  Administered 2023-09-13: 40 mg via INTRA_ARTICULAR

## 2023-09-13 NOTE — Progress Notes (Signed)
   Procedure Note  Patient: Christina Lewis             Date of Birth: 26-May-1955           MRN: [id number]             Visit Date: 09/13/2023  Procedures: Visit Diagnoses:  1. Chronic SI joint pain   With history of rheumatoid arthritis and osteoarthritis.  She comes today to get right SI joint injection.  Her last SI joint injection was in February 2025.  She was advised to return for increased pain.  She had good response to last SI joint injections.  Sacroiliac Joint Inj on 09/13/2023 2:36 PM Indications: pain Details: 27 G 1.5 in needle, posterior approach Medications: 1 mL lidocaine  1 %; 40 mg triamcinolone  acetonide 40 MG/ML Aspirate: 0 mL Outcome: tolerated well, no immediate complications  Risk of infection, tendon injury, nerve injury, hypopigmentation and dermal atrophy were discussed. Procedure, treatment alternatives, risks and benefits explained, specific risks discussed. Consent was given by the patient. Immediately prior to procedure a time out was called to verify the correct patient, procedure, equipment, support staff and site/side marked as required. Patient was prepped and draped in the usual sterile fashion.     Maya Nash, MD

## 2023-09-14 ENCOUNTER — Ambulatory Visit (INDEPENDENT_AMBULATORY_CARE_PROVIDER_SITE_OTHER): Payer: Self-pay | Admitting: Otolaryngology

## 2023-09-27 ENCOUNTER — Ambulatory Visit (INDEPENDENT_AMBULATORY_CARE_PROVIDER_SITE_OTHER): Admitting: Family Medicine

## 2023-09-27 ENCOUNTER — Encounter: Payer: Self-pay | Admitting: Family Medicine

## 2023-09-27 VITALS — BP 136/84 | HR 86 | Temp 97.0°F | Ht 64.0 in | Wt 165.4 lb

## 2023-09-27 DIAGNOSIS — R911 Solitary pulmonary nodule: Secondary | ICD-10-CM | POA: Diagnosis not present

## 2023-09-27 DIAGNOSIS — R053 Chronic cough: Secondary | ICD-10-CM

## 2023-09-27 DIAGNOSIS — D849 Immunodeficiency, unspecified: Secondary | ICD-10-CM

## 2023-09-27 NOTE — Progress Notes (Signed)
 Subjective:  Patient ID: Christina Lewis, female    DOB: 1956/01/08, 68 y.o.   MRN: [id number]  Patient Care Team: Severa Rock HERO, FNP as PCP - General (Family Medicine) Gladis Gearing, OD (Optometry)   Chief Complaint:  discuss CT and Cough (X 6-7 months )   HPI: Christina Lewis is a 68 y.o. female presenting on 09/27/2023 for discuss CT and Cough (X 6-7 months )  Christina Lewis is a 68 year old female with a history of tuberculosis as a child and immunocompromised status who presents with a persistent cough and a lung nodule.  She has experienced a persistent cough for six to seven months, described as intermittent and dry, occurring every couple of weeks and lasting for a few hours. The cough is wheezy, as noted by her sister, but there is no sputum production. No hemoptysis, unexplained night sweats, or significant weight loss, although she has lost weight since starting Jardiance.  A cardiac CT scan revealed a nodule in the right middle lobe. She has significant exposure to secondhand smoke, having lived with her husband, a smoker, for forty-six years. Her husband recently passed away from lung cancer complications.  She also has a history of gastroesophageal reflux disease (GERD) and esophageal spasms, managed with Filazac 80 mg daily, divided into 40 mg in the morning and 40 mg at night. Her last endoscopy was in 2023, performed concurrently with a colonoscopy.          Relevant past medical, surgical, family, and social history reviewed and updated as indicated.  Allergies and medications reviewed and updated. Data reviewed: Chart in Epic.   Past Medical History:  Diagnosis Date   Achilles tendonitis    right foot   Allergy     Anemia    Anxiety    Arthritis    Cataract 2020   Depression    Diabetes mellitus    type 2   Eczema    Fibromyalgia    GERD (gastroesophageal reflux disease)    Gout    no current problem   Hematuria    History of kidney stones     passed stones and also had surgery to remove one   History of tics    Hyperlipidemia 2007   Ineffective esophageal motility    tx with prilosec, essophageal was stretched   Interstitial cystitis 2016   Migraines    Osteoarthritis    PCOS (polycystic ovarian syndrome) 1993   Plantar fasciitis, bilateral    Pneumonia 2019   PONV (postoperative nausea and vomiting)    Recurrent upper respiratory infection (URI)    Sleep apnea    does not use CPAP   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, took medication for a year   Thyroid  nodule    Dr Bella is monitoring every year    Past Surgical History:  Procedure Laterality Date   ABDOMINAL HYSTERECTOMY  2000   APPENDECTOMY  1976   BALLOON DILATION N/A 11/03/2021   Procedure: MERRILL DILATION;  Surgeon: Saintclair Jasper, MD;  Location: [facility];  Service: Gastroenterology;  Laterality: N/A;   BOTOX  INJECTION N/A 11/03/2021   Procedure: BOTOX  INJECTION;  Surgeon: Saintclair Jasper, MD;  Location: [facility];  Service: Gastroenterology;  Laterality: N/A;   BREAST LUMPECTOMY  1989   lt-negative   CARDIOVASCULAR STRESS TEST  2000   CATARACT EXTRACTION W/PHACO Left 12/07/2019   Procedure: CATARACT EXTRACTION PHACO AND INTRAOCULAR LENS PLACEMENT (IOC);  Surgeon:  Harrie Agent, MD;  Location: [facility];  Service: Ophthalmology;  Laterality: Left;  CDE: 5.55   CHOLECYSTECTOMY  1985   COLONOSCOPY     DRUG INDUCED ENDOSCOPY Bilateral 09/29/2022   Procedure: DRUG INDUCED ENDOSCOPY;  Surgeon: Okey Burns, MD;  Location: [facility];  Service: ENT;  Laterality: Bilateral;  15 MINUTES   ESOPHAGEAL MANOMETRY N/A 08/24/2021   Procedure: ESOPHAGEAL MANOMETRY (EM);  Surgeon: Saintclair Jasper, MD;  Location: [facility];  Service: Gastroenterology;  Laterality: N/A;   ESOPHAGOGASTRODUODENOSCOPY N/A 11/03/2021   Procedure: ESOPHAGOGASTRODUODENOSCOPY (EGD);  Surgeon: Saintclair Jasper, MD;  Location: [facility];  Service: Gastroenterology;   Laterality: N/A;   EYE SURGERY  2021   HYSTERECTOMY ABDOMINAL WITH SALPINGECTOMY Bilateral 2000   IMPLANTATION OF HYPOGLOSSAL NERVE STIMULATOR Right 11/24/2022   Procedure: IMPLANTATION OF HYPOGLOSSAL NERVE STIMULATOR;  Surgeon: Okey Burns, MD;  Location: [facility];  Service: ENT;  Laterality: Right;   KIDNEY STONE SURGERY Right 2014   with stent placement in OR   KNEE ARTHROSCOPY Right 08/22/2013   Procedure: ARTHROSCOPY RIGHT KNEE FOR INFECTION LAVAGE AND DRAINAGE;  Surgeon: LELON JONETTA Shari Mickey., MD;  Location: [facility];  Service: Orthopedics;  Laterality: Right;   KNEE ARTHROSCOPY WITH PATELLA RECONSTRUCTION Right 08/06/2013   Procedure: RIGHT KNEE ARTHROSCOPY WITH MENISCECTOMY MEDIAL, ARTHROSCOPY KNEE WITH DEBRIDEMENT/SHAVING (CHONDROPLASTY) ;  Surgeon: LELON JONETTA Shari Mickey., MD;  Location: [facility];  Service: Orthopedics;  Laterality: Right;   LUMBAR LAMINECTOMY/DECOMPRESSION MICRODISCECTOMY Right 09/11/2019   Procedure: Right Lumbar Three-Four Microdiscectomy;  Surgeon: Unice Pac, MD;  Location: [facility];  Service: Neurosurgery;  Laterality: Right;  Right Lumbar Three-Four Microdiscectomy   NASAL SINUS SURGERY     x4   OOPHORECTOMY     SHOULDER SURGERY  2011   right shoulder   SPINE SURGERY  2020   THYROID  SURGERY  1994   TONSILLECTOMY Bilateral 1974    Social History   Socioeconomic History   Marital status: Widowed    Spouse name: Elsie   Number of children: Not on file   Years of education: Not on file   Highest education level: Bachelor's degree (e.g., BA, AB, BS)  Occupational History   Occupation: retired  Tobacco Use   Smoking status: Never    Passive exposure: Past   Smokeless tobacco: Never  Vaping Use   Vaping status: Never Used  Substance and Sexual Activity   Alcohol  use: Never   Drug use: Never   Sexual activity: Not Currently    Birth control/protection: Post-menopausal    Comment: Hysterectomy  Other Topics Concern   Not  on file  Social History Narrative   Caffiene 4-5 12 oz cans soda.   Working retired - special ed.   Live with home no kids   Social Drivers of Health   Financial Resource Strain: Low Risk  (09/25/2023)   Overall Financial Resource Strain (CARDIA)    Difficulty of Paying Living Expenses: Not very hard  Food Insecurity: No Food Insecurity (09/25/2023)   Hunger Vital Sign    Worried About Running Out of Food in the Last Year: Never true    Ran Out of Food in the Last Year: Never true  Transportation Needs: No Transportation Needs (09/25/2023)   PRAPARE - Administrator, Civil Service (Medical): No    Lack of Transportation (Non-Medical): No  Physical Activity: Inactive (09/25/2023)   Exercise Vital Sign    Days of Exercise per Week: 0 days  Minutes of Exercise per Session: Not on file  Stress: No Stress Concern Present (09/25/2023)   [employer] of Occupational Health - Occupational Stress Questionnaire    Feeling of Stress: Only a little  Social Connections: Moderately Isolated (09/25/2023)   Social Connection and Isolation Panel    Frequency of Communication with Friends and Family: More than three times a week    Frequency of Social Gatherings with Friends and Family: More than three times a week    Attends Religious Services: 1 to 4 times per year    Active Member of [employer] or Organizations: No    Attends Banker Meetings: Not on file    Marital Status: Widowed  Intimate Partner Violence: Not At Risk (02/18/2023)   Humiliation, Afraid, Rape, and Kick questionnaire    Fear of Current or Ex-Partner: No    Emotionally Abused: No    Physically Abused: No    Sexually Abused: No    Outpatient Encounter Medications as of 09/27/2023  Medication Sig   baclofen  (LIORESAL ) 10 MG tablet TAKE 1 TABLET BY MOUTH EACH MORNING AND 1 TABLET AT NOON.   budesonide  (PULMICORT ) 0.5 MG/2ML nebulizer solution Take 2 mLs (0.5 mg total) by nebulization in the morning and at  bedtime. MIX 1 VIAL WITH 250CC OF NORMAL SALINE FOR SINUS WASH DAILY AS NEEDED   Carbinoxamine  Maleate 4 MG TABS Take 2 tablets (8 mg total) by mouth 2 (two) times daily as needed (allergies).   cholecalciferol (VITAMIN D ) 25 MCG (1000 UNIT) tablet Take 1,000 Units by mouth daily.   Eptinezumab -jjmr (VYEPTI  IV) Inject 300 mg into the vein every 3 (three) months.   folic acid  (FOLVITE ) 1 MG tablet TAKE TWO TABLETS BY MOUTH EVERY DAY   gabapentin (NEURONTIN) 300 MG capsule Take 300 mg by mouth 3 (three) times daily.   HYDROcodone -acetaminophen  (NORCO/VICODIN) 5-325 MG tablet Take by mouth.   hydroxychloroquine  (PLAQUENIL ) 200 MG tablet TAKE 1 TABLET TWICE DAILY MONDAY THRU FRIDAY FOR RHEUMATOID ARTHRTITIS.   Lancets (ONETOUCH DELICA PLUS LANCET33G) MISC SMARTSIG:1 Topical Daily   MAGNESIUM  PO Take 500 mg by mouth at bedtime.   methotrexate  50 MG/2ML injection Inject 0.8 mLs (20 mg total) into the skin once a week.   Multiple Vitamin (MULTIVITAMIN WITH MINERALS) TABS tablet Take 1 tablet by mouth daily.   omeprazole (PRILOSEC) 20 MG capsule Take 40 mg by mouth daily.   ondansetron  (ZOFRAN -ODT) 8 MG disintegrating tablet Take 8 mg by mouth every 8 (eight) hours as needed for nausea.    ONETOUCH VERIO test strip 1 each daily.   Polyvinyl Alcohol -Povidone (REFRESH OP) Apply to eye as needed.   Rimegepant Sulfate (NURTEC) 75 MG TBDP Take 1 tablet (75 mg total) by mouth daily as needed. NDC [address]-8, lot [id number] exp 09/2025   tacrolimus  (PROTOPIC ) 0.1 % ointment Apply topically 2 (two) times daily.   tiZANidine  (ZANAFLEX ) 4 MG tablet Take 1 tablet (4 mg total) by mouth at bedtime as needed.   TUBERCULIN SYR 1CC/27GX1/2 (B-D TB SYRINGE 1CC/27GX1/2) 27G X 1/2 1 ML MISC 12 Syringes by Does not apply route once a week.   No facility-administered encounter medications on file as of 09/27/2023.    Allergies  Allergen Reactions   Sulfa Antibiotics Anaphylaxis and Other (See Comments)    Sulfamethoxazole-Trimethoprim Anaphylaxis   Atorvastatin Other (See Comments)    Severe Muscle and Joint pain with ALL statins    Codeine Itching   Erythromycin Nausea And Vomiting   Metronidazole  Diarrhea and  Other (See Comments)   Morphine And Codeine Itching   Semaglutide Nausea And Vomiting    Ozempic*    Pertinent ROS per HPI, otherwise unremarkable      Objective:  BP 136/84   Pulse 86   Temp (!) 97 F (36.1 C)   Ht 5' 4 (1.626 m)   Wt 165 lb 6.4 oz (75 kg)   SpO2 94%   BMI 28.39 kg/m    Wt Readings from Last 3 Encounters:  09/27/23 165 lb 6.4 oz (75 kg)  07/21/23 166 lb (75.3 kg)  07/04/23 163 lb 9.6 oz (74.2 kg)    Physical Exam Vitals and nursing note reviewed.  Constitutional:      Appearance: Normal appearance. She is well-developed, well-groomed and overweight.  HENT:     Head: Normocephalic and atraumatic.     Nose: Nose normal.     Mouth/Throat:     Mouth: Mucous membranes are moist.     Pharynx: Oropharynx is clear.  Eyes:     Pupils: Pupils are equal, round, and reactive to light.  Cardiovascular:     Rate and Rhythm: Normal rate and regular rhythm.     Heart sounds: Normal heart sounds.  Pulmonary:     Effort: Pulmonary effort is normal.     Breath sounds: Normal breath sounds. No wheezing, rhonchi or rales.  Musculoskeletal:     Cervical back: Neck supple.     Right lower leg: No edema.     Left lower leg: No edema.  Skin:    General: Skin is warm and dry.     Capillary Refill: Capillary refill takes less than 2 seconds.  Neurological:     General: No focal deficit present.     Mental Status: She is alert and oriented to person, place, and time.  Psychiatric:        Mood and Affect: Mood normal.        Behavior: Behavior normal. Behavior is cooperative.        Thought Content: Thought content normal.        Judgment: Judgment normal.     Results for orders placed or performed in visit on 08/15/23  Comprehensive metabolic panel  with GFR   Collection Time: 08/15/23 11:06 AM  Result Value Ref Range   Glucose, Bld 118 (H) 65 - 99 mg/dL   BUN 21 7 - 25 mg/dL   Creat 9.27 9.49 - 8.94 mg/dL   eGFR 92 > OR = 60 fO/fpw/8.26f7   BUN/Creatinine Ratio SEE NOTE: 6 - 22 (calc)   Sodium 139 135 - 146 mmol/L   Potassium 4.6 3.5 - 5.3 mmol/L   Chloride 103 98 - 110 mmol/L   CO2 28 20 - 32 mmol/L   Calcium 9.9 8.6 - 10.4 mg/dL   Total Protein 6.6 6.1 - 8.1 g/dL   Albumin 4.6 3.6 - 5.1 g/dL   Globulin 2.0 1.9 - 3.7 g/dL (calc)   AG Ratio 2.3 1.0 - 2.5 (calc)   Total Bilirubin 0.2 0.2 - 1.2 mg/dL   Alkaline phosphatase (APISO) 65 37 - 153 U/L   AST 16 10 - 35 U/L   ALT 18 6 - 29 U/L  CBC with Differential/Platelet   Collection Time: 08/15/23 11:06 AM  Result Value Ref Range   WBC 16.6 (H) 3.8 - 10.8 Thousand/uL   RBC 4.41 3.80 - 5.10 Million/uL   Hemoglobin 13.9 11.7 - 15.5 g/dL   HCT 58.1 64.9 - 54.9 %  MCV 94.8 80.0 - 100.0 fL   MCH 31.5 27.0 - 33.0 pg   MCHC 33.3 32.0 - 36.0 g/dL   RDW 86.9 88.9 - 84.9 %   Platelets 399 140 - 400 Thousand/uL   MPV 10.1 7.5 - 12.5 fL   Neutro Abs 12,234 (H) 1,500 - 7,800 cells/uL   Absolute Lymphocytes 3,104 850 - 3,900 cells/uL   Absolute Monocytes 913 200 - 950 cells/uL   Eosinophils Absolute 133 15 - 500 cells/uL   Basophils Absolute 216 (H) 0 - 200 cells/uL   Neutrophils Relative % 73.7 %   Total Lymphocyte 18.7 %   Monocytes Relative 5.5 %   Eosinophils Relative 0.8 %   Basophils Relative 1.3 %   Smear Review         Pertinent labs & imaging results that were available during my care of the patient were reviewed by me and considered in my medical decision making.  Assessment & Plan:  Rmoni was seen today for discuss ct and cough.  Diagnoses and all orders for this visit:  Right middle lobe pulmonary nodule -     Ambulatory referral to Pulmonology  Immunocompromised [facility]) -     Ambulatory referral to Pulmonology  Chronic cough -     Ambulatory referral to  Pulmonology      Right middle lobe pulmonary nodule in immunocompromised patient with tuberculosis and chronic intermittent cough A small nodule in the right iddle lobe was identified during a cardiac CT scan. Given her immunocompromised status, tuberculosis, and chronic intermittent cough lasting six to seven months, further investigation is warranted. The cough is dry and wheezy, occurring every couple of weeks, lasting for a few hours without sputum production. There is secondhand smoke exposure from her husband, a smoker. Differential diagnosis includes possible lung cancer or underlying asthma. - Refer to pulmonologist for further evaluation - Ensure pulmonologist orders repeat scans - Consider pulmonary function testing to assess for underlying asthma  Gastroesophageal reflux disease with esophageal spasm GERD with esophageal spasm is managed with Filazac 80 mg daily, divided into 40 mg in the morning and 40 mg at night. The last endoscopy was performed in 2023. Further assessment of GERD and esophageal spasm may be needed if pulmonology evaluation is normal to determine if reflux is affecting the esophagus. - Consider follow-up endoscopy if pulmonology evaluation is normal          Continue all other maintenance medications.  Follow up plan: Return if symptoms worsen or fail to improve.   Continue healthy lifestyle choices, including diet (rich in fruits, vegetables, and lean proteins, and low in salt and simple carbohydrates) and exercise (at least 30 minutes of moderate physical activity daily).   The above assessment and management plan was discussed with the patient. The patient verbalized understanding of and has agreed to the management plan. Patient is aware to call the clinic if they develop any new symptoms or if symptoms persist or worsen. Patient is aware when to return to the clinic for a follow-up visit. Patient educated on when it is appropriate to go to the emergency  department.   Rosaline Bruns, FNP-C [facility] [phone]

## 2023-09-28 DIAGNOSIS — F411 Generalized anxiety disorder: Secondary | ICD-10-CM | POA: Diagnosis not present

## 2023-09-28 DIAGNOSIS — F331 Major depressive disorder, recurrent, moderate: Secondary | ICD-10-CM | POA: Diagnosis not present

## 2023-09-30 ENCOUNTER — Other Ambulatory Visit: Payer: Self-pay | Admitting: Physician Assistant

## 2023-09-30 NOTE — Telephone Encounter (Signed)
 Last Fill: 08/22/2023   Next Visit: 12/22/2023   Last Visit: 07/21/2023   Dx: Fibromyalgia    Current Dose per office note on 07/21/2023: not discussed   Okay to refill Tizanidine  and Baclofen ?

## 2023-10-06 ENCOUNTER — Ambulatory Visit (INDEPENDENT_AMBULATORY_CARE_PROVIDER_SITE_OTHER): Payer: Self-pay | Admitting: Otolaryngology

## 2023-10-06 ENCOUNTER — Encounter (INDEPENDENT_AMBULATORY_CARE_PROVIDER_SITE_OTHER): Payer: Self-pay | Admitting: Otolaryngology

## 2023-10-06 VITALS — BP 135/78 | HR 87

## 2023-10-06 DIAGNOSIS — J3089 Other allergic rhinitis: Secondary | ICD-10-CM | POA: Diagnosis not present

## 2023-10-06 DIAGNOSIS — G4733 Obstructive sleep apnea (adult) (pediatric): Secondary | ICD-10-CM | POA: Diagnosis not present

## 2023-10-06 DIAGNOSIS — R0982 Postnasal drip: Secondary | ICD-10-CM

## 2023-10-06 DIAGNOSIS — R0981 Nasal congestion: Secondary | ICD-10-CM

## 2023-10-06 DIAGNOSIS — J343 Hypertrophy of nasal turbinates: Secondary | ICD-10-CM

## 2023-10-06 MED ORDER — BUDESONIDE 0.5 MG/2ML IN SUSP: 0.5000 mg | Freq: Two times a day (BID) | RESPIRATORY_TRACT | 12 refills | Status: DC

## 2023-10-06 NOTE — Progress Notes (Signed)
 ENT Progress Note:   Update 10/06/2023   Discussed the use of AI scribe software for clinical note transcription with the patient, who gave verbal consent to proceed.  History of Present Illness Christina Lewis is a 68 year old female with chronic nasal congestion and environmental allergies who presents for fu.  She experiences persistent allergy  symptoms characterized by a constant pattern of sniffing, which have not improved with nasal washes and antihistamines. On some days, she requires additional Benadryl  to manage her symptoms.  She has a long-standing history of allergic rhinitis and has tried treatment with antihistamines and nasal washes. She is currently under the care of an allergist who has prescribed a new antihistamine and recommended continuing nasal washes.  She is using Inspire therapy for obstructive sleep apnea and reports satisfaction with the treatment.  Records Reviewed:  Initial Evaluation   Update 06/01/2023  Discussed the use of AI scribe software for clinical note transcription with the patient, who gave verbal consent to proceed.  History of Present Illness Christina Lewis is a 68 year old female who presents with ear discomfort and chronic vs recurrent sore throat.  She experiences a sensation of both ears being 'clogged up,' with pain in the left ear and a 'swishy' sound in the right ear, described as 'like the ocean.' These symptoms began on Saturday night or Sunday. She has had similar ear issues for about six years, with previous ENT consultations suggesting ear tubes. She uses nasal rinses, decongestants, and antihistamines (Sudafed during the day and an unspecified antihistamine at night) to manage symptoms. She stopped taking Advil  due to concerns about ear ringing. Her hearing was tested recently and was normal. No current infection in her ears is reported.  She reports a sore throat and mouth sores in her mouth, including ulcers in the back of her  throat. She has a history of nonallergic rhinitis diagnosed four to five years ago and has undergone allergy  testing via skin prick, which was negative. She received eye drops for an ulcer-like lesion under her eyelid but no ear drops.  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 She denies having reflux in the traditional sense but acknowledges esophageal spasms.  In her teens, she was allergic to many things and underwent allergy  shots for two and a half years. She is open to retesting for allergies. She reports a history of sinus surgery and has been using steroid rinses every day.  Records Reviewed  Visit in ED 05/28/23 Patient presenting today with intermittent episodes for the past month of gritty sensation to the right eye, redness and injection. States she has been using over-the-counter drops for this issue that were recommended by her eye specialist and that the symptoms always go away but often come back typically noting it first thing in the morning. She denies visual change, headache, nausea, vomiting, fevers, known injury to the eye   History of Present Illness   Prior visit records reviewed: Update 02/04/23  History of Present Illness   The patient, with a history of prior sinus infections and sinus surgery, presents with recurrent symptoms despite treatment with antibiotics and steroids given by PCP. They describe a pattern of improvement following treatment, only to relapse within a week or two. Symptoms include ear pressure, a sensation of struggling to breathe though her nose, and constant need to clear the throat, blow her nose. These  symptoms have been disruptive to their sleep.   A CT sinuses was performed 10/2022, with patent paranasal sinuses and evidence of prior FESS. She developed cold/URI sx right after CT.    The patient has been using Flonase  for their nasal  symptoms, having switched from another nasal spray due to excessive dryness. They have also been taking Xyzal , Sudafed, and Mucinex, and using saline rinses to alleviate their symptoms.   The patient also reports a sensation of fluid in their right ear. She is doing well with Inspire implant, and currently using it during sleep.   Records reviewed PCP office visit 02/01/23 The patient, with a history of sinus issues, presents with recurrent symptoms despite previous treatment with antibiotics and steroids. They describe a pattern of improvement following treatment, only to relapse within a week or two. Symptoms include blocked ears, a sensation of struggling to breathe, and constant need to clear the throat, blow the nose, and snore. These symptoms have been disruptive to their sleep.   The patient had seen an ENT specialist a few weeks prior, who found no significant issues at the time. However, the patient's symptoms have since returned. A CT scan was performed, but the patient fell ill after the scan. The patient also mentions difficulty controlling their blood sugar levels and has an upcoming appointment with their endocrinologist.   The patient has been using Flonase  for their nasal symptoms, having switched from another nasal spray due to excessive dryness. They have also been taking Xyzal , Sudafed, and Mucinex, and using saline solution to alleviate their symptoms.   The patient also reports a sensation of fluid in their right ear and a swollen lymph node. They have a history of elevated liver enzymes and white blood cell count. The patient is scheduled to see the ENT specialist again soon  Update 01/14/23:   Discussed the use of AI scribe software for clinical note transcription with the patient, who gave verbal consent to proceed.  History of Present Illness   The patient, with a recent history of Inspire implant surgery, presents for follow-up. The patient reports mild discomfort at the  site of submental incision at night. Despite these symptoms, the patient expresses satisfaction with the overall surgical outcome and how incisions appear currently.   In addition to post-surgical discomfort, the patient has been dealing with a persistent sinus issues/nasal congestion. They report a recent severe sinus infection, which required multiple changes in antibiotic therapy, including two courses of Augmentin . The infection has since improved, but the patient continues to experience significant post-nasal drainage, particularly at night, to the point of choking. Prior to the infection, the patient had been experiencing chronic nasal congestion and a constant need to clear the nose, which had temporarily resolved with changes in medication but has since returned.  The patient is currently on a regimen of allergy  medications, including nasal sprays. The patient has not been using saline rinses but has been using saline sprays.     Initial evaluation 09/22/22 Reason for Consult: OSA, nasal congestion and facial pain/pressure/post-nasal drainage/headaches/tinnitus    HPI: Christina Lewis is an 68 y.o. female with hx RA affecting small joints, f.b Rheum on Methotrexate  and Plaquenil , hx of OSA, previously on CPAP for many years, however, discontinued CPAP use 2/2 being unable to tolerate the mask, here for multiple complaints.   1)  She is here for nasal congestion, nasal obstruction, and frontal headaches for years, feels nasal drainage and post-nasal drainage. She has decreased sense  of smell, and she thinks it happened after covid 2021. Had allergy  testing 4 years ago and it was negative. She had allergy  shots for 4 yrs when she was a child. On Flonase  and Zyrtec. She has hx of migraines, and feels they are under control after starting an infusion medication (new drug for migraines). She has frontal headache on the left when it acts up and with her sinus sx. 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 No new sx   3) hx of OSA and had three CPAP machines (used one for 1.5 yrs) and diagnosed in 2002. She stopped using it due to intoleence and was recently re-established with sleep medicine. Recent sleep study done with [facility] neurology and she is interested in [place] implant.   3) Bilateral tinnitus, water in her ear, worse on the left, and she had ear infections in her ears and chronic sinus all her life, she  4) She reports hx of Esophageal dysmotility/GERD - on Prilosec.       Past Medical History:  Diagnosis Date   Achilles tendonitis    right foot   Allergy     Anemia    Anxiety    Arthritis    Cataract 2020   Depression    Diabetes mellitus    type 2   Eczema    Fibromyalgia    GERD (gastroesophageal reflux disease)    Gout    no current problem   Hematuria    History of kidney stones    passed stones and also had surgery to remove one   History of tics    Hyperlipidemia 2007   Ineffective esophageal motility    tx with prilosec, essophageal was stretched   Interstitial cystitis 2016   Migraines    Osteoarthritis    PCOS (polycystic ovarian syndrome) 1993   Plantar fasciitis, bilateral    Pneumonia 2019   PONV (postoperative nausea and vomiting)    Recurrent upper respiratory infection (URI)    Sleep apnea    does not use CPAP   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, took medication for a year   Thyroid  nodule    Dr Bella is monitoring every year    Past Surgical History:  Procedure Laterality Date   ABDOMINAL HYSTERECTOMY  2000   APPENDECTOMY  1976   BALLOON DILATION N/A 11/03/2021   Procedure: MERRILL DILATION;  Surgeon: Saintclair Jasper, MD;  Location: [facility];  Service: Gastroenterology;  Laterality: N/A;   BOTOX   INJECTION N/A 11/03/2021   Procedure: BOTOX  INJECTION;  Surgeon: Saintclair Jasper, MD;  Location: [facility];  Service: Gastroenterology;  Laterality: N/A;   BREAST LUMPECTOMY  1989   lt-negative   CARDIOVASCULAR STRESS TEST  2000   CATARACT EXTRACTION W/PHACO Left 12/07/2019   Procedure: CATARACT EXTRACTION PHACO AND INTRAOCULAR LENS PLACEMENT (IOC);  Surgeon: Harrie Agent, MD;  Location: [facility];  Service: Ophthalmology;  Laterality: Left;  CDE: 5.55   CHOLECYSTECTOMY  1985   COLONOSCOPY     DRUG INDUCED ENDOSCOPY Bilateral 09/29/2022   Procedure: DRUG INDUCED ENDOSCOPY;  Surgeon: Okey Burns, MD;  Location: [facility];  Service: ENT;  Laterality: Bilateral;  15 MINUTES   ESOPHAGEAL MANOMETRY N/A 08/24/2021   Procedure: ESOPHAGEAL MANOMETRY (EM);  Surgeon:  Saintclair Jasper, MD;  Location: [facility];  Service: Gastroenterology;  Laterality: N/A;   ESOPHAGOGASTRODUODENOSCOPY N/A 11/03/2021   Procedure: ESOPHAGOGASTRODUODENOSCOPY (EGD);  Surgeon: Saintclair Jasper, MD;  Location: [facility];  Service: Gastroenterology;  Laterality: N/A;   EYE SURGERY  2021   HYSTERECTOMY ABDOMINAL WITH SALPINGECTOMY Bilateral 2000   IMPLANTATION OF HYPOGLOSSAL NERVE STIMULATOR Right 11/24/2022   Procedure: IMPLANTATION OF HYPOGLOSSAL NERVE STIMULATOR;  Surgeon: Okey Burns, MD;  Location: [facility];  Service: ENT;  Laterality: Right;   KIDNEY STONE SURGERY Right 2014   with stent placement in OR   KNEE ARTHROSCOPY Right 08/22/2013   Procedure: ARTHROSCOPY RIGHT KNEE FOR INFECTION LAVAGE AND DRAINAGE;  Surgeon: LELON JONETTA Shari Mickey., MD;  Location: [facility];  Service: Orthopedics;  Laterality: Right;   KNEE ARTHROSCOPY WITH PATELLA RECONSTRUCTION Right 08/06/2013   Procedure: RIGHT KNEE ARTHROSCOPY WITH MENISCECTOMY MEDIAL, ARTHROSCOPY KNEE WITH DEBRIDEMENT/SHAVING (CHONDROPLASTY) ;  Surgeon: LELON JONETTA Shari Mickey., MD;  Location: [facility];  Service: Orthopedics;  Laterality:  Right;   LUMBAR LAMINECTOMY/DECOMPRESSION MICRODISCECTOMY Right 09/11/2019   Procedure: Right Lumbar Three-Four Microdiscectomy;  Surgeon: Unice Pac, MD;  Location: [facility];  Service: Neurosurgery;  Laterality: Right;  Right Lumbar Three-Four Microdiscectomy   NASAL SINUS SURGERY     x4   OOPHORECTOMY     SHOULDER SURGERY  2011   right shoulder   SPINE SURGERY  2020   THYROID  SURGERY  1994   TONSILLECTOMY Bilateral 1974    Family History  Problem Relation Age of Onset   Fibromyalgia Mother    Psoriasis Mother        psoriatic arthritis    Diabetes Mother    Heart disease Mother    Arthritis Mother    Asthma Mother    Hyperlipidemia Mother    Hypertension Mother    Stroke Mother    Rheum arthritis Sister    Arthritis Sister    Asthma Sister    Diabetes Sister    Hyperlipidemia Sister    Miscarriages / Stillbirths Sister    Psoriasis Sister        psoriatic arthritis   Arthritis Sister    Asthma Sister    Diabetes Sister    Hyperlipidemia Sister    Hypertension Sister    Miscarriages / [place] Sister     Social History:  reports that she has never smoked. She has been exposed to tobacco smoke. She has never used smokeless tobacco. She reports that she does not drink alcohol  and does not use drugs.  Allergies:  Allergies  Allergen Reactions   Sulfa Antibiotics Anaphylaxis and Other (See Comments)   Sulfamethoxazole-Trimethoprim Anaphylaxis   Atorvastatin Other (See Comments)    Severe Muscle and Joint pain with ALL statins    Codeine Itching   Erythromycin Nausea And Vomiting   Metronidazole  Diarrhea and Other (See Comments)   Morphine And Codeine Itching   Semaglutide Nausea And Vomiting    Ozempic*    Medications: I have reviewed the patient's current medications.  The PMH, PSH, Medications, Allergies, and SH were reviewed and updated.  ROS: Constitutional: Negative for fever, weight loss and weight gain. Cardiovascular: Negative for chest pain and  dyspnea on exertion. Respiratory: Is not experiencing shortness of breath at rest. Gastrointestinal: Negative for nausea and vomiting. Neurological: Negative for headaches. Psychiatric: The patient is not nervous/anxious  Blood pressure 135/78, pulse 87, SpO2 96%.  PHYSICAL EXAM:  Exam: General: Well-developed, well-nourished Respiratory Respiratory effort: Equal inspiration and expiration  without stridor Cardiovascular Peripheral Vascular: Warm extremities with equal color/perfusion Eyes: No nystagmus with equal extraocular motion bilaterally Neuro/Psych/Balance: Patient oriented to person, place, and time; Appropriate mood and affect; Gait is intact with no imbalance; Cranial nerves I-XII are intact Head and Face Inspection: Normocephalic and atraumatic without mass or lesion Palpation: Facial skeleton intact without bony stepoffs Salivary Glands: No mass or tenderness Facial Strength: Facial motility symmetric and full bilaterally ENT Pinna: External ear intact and fully developed External canal: Canal is patent with intact skin Tympanic Membrane: Clear and mobile External Nose: No scar or anatomic deformity Bilateral inferior turbinate hypertrophy.  Lips, Teeth, and gums: Mucosa and teeth intact and viable TMJ: No pain to palpation with full mobility Oral cavity/oropharynx: No erythema or exudate, no lesions present Small area of mucosal irregularity along the lingual surface of the left mandible, no bleeding or erythema, no other ulcerations masses lesion noted  Neck Neck and Trachea: Midline trachea without mass or lesion Thyroid : No mass or nodularity Lymphatics: No lymphadenopathy   Studies Reviewed: CT sinuses 11/18/22  FINDINGS: Paranasal sinuses:   Frontal: Normally aerated. Patent frontal sinus drainage pathways.   Ethmoid: Postsurgical changes from prior bilateral inferior ethmoidectomies. Normally aerated.   Maxillary: Postsurgical changes from bilateral  maxillary antrostomies. There is trace mucosal thickening of the floor of the left maxillary sinus.   Sphenoid: Normally aerated. Patent sphenoethmoidal recesses.   Right ostiomeatal unit: Patent.   Left ostiomeatal unit: Patent.   Nasal passages: Patent. Intact nasal septum is midline.   Anatomy: There is pneumatization superior to both anterior ethmoid notches. Symmetric and intact olfactory grooves and fovea ethmoidalis, Keros II (4-42mm). Sellar sphenoid pneumatization pattern. No dehiscence of carotid or optic canals. No onodi cell.   Other: Orbits and intracranial compartment are unremarkable. Left lens replacement. Visible mastoid air cells are normally aerated.   IMPRESSION: Postsurgical changes from prior FESS with trace mucosal thickening of the floor of the left maxillary sinus. Otherwise, the paranasal sinuses are normally aerated with patent sinus drainage pathways.    Allergy  test 07/25/23  Assessment/Plan: Encounter Diagnoses  Name Primary?   Chronic nasal congestion Yes   Post-nasal drip    Environmental and seasonal allergies    Hypertrophy of both inferior nasal turbinates    Obstructive sleep apnea         Assessment & Plan Chronic nasal congestion and Environmental allergies Chronic allergic rhinitis with persistent symptoms despite antihistamine and nasal washes with steroid and saline. Allergy  testing identified specific allergens. Allergy  injections considered if no improvement. - Continue antihistamine and nasal washes with steroids as prescribed. Steroid rinses refill sent - she was offered allergy  shots and will see Allergy  in a few days to discuss initiation of immunotherapy   Obstructive sleep apnea status post Inspire device Obstructive sleep apnea managed with Inspire device. She reports satisfaction and no incision site issues. - continue Inspire therapy    Aleksia Freiman, MD Otolaryngology [facility] Phone:  [phone] Fax: [phone]    10/06/2023, 2:30 PM

## 2023-10-07 ENCOUNTER — Ambulatory Visit: Admitting: Family Medicine

## 2023-10-07 ENCOUNTER — Other Ambulatory Visit: Payer: Self-pay

## 2023-10-07 ENCOUNTER — Encounter: Payer: Self-pay | Admitting: Family Medicine

## 2023-10-07 VITALS — BP 120/74 | HR 83 | Temp 97.3°F | Resp 18 | Ht 63.39 in | Wt 165.4 lb

## 2023-10-07 DIAGNOSIS — J329 Chronic sinusitis, unspecified: Secondary | ICD-10-CM | POA: Insufficient documentation

## 2023-10-07 DIAGNOSIS — G43709 Chronic migraine without aura, not intractable, without status migrainosus: Secondary | ICD-10-CM | POA: Diagnosis not present

## 2023-10-07 NOTE — Patient Instructions (Signed)
 Allergic rhinitis Continue allergen avoidance measures directed toward grass pollen, weed pollen, tree pollen, outdoor mold, and cat as listed below Continue montelukast  10 mg once a day to prevent allergy  symptoms Continue carbinoxamine  4 mg tablets 1 to 2 tablets once or twice a day if needed for nasal symptoms Continue budesonide  nasal rinses up to twice a day if needed for stuffy nose Continue ipratropium nasal spray 2 sprays in each nostril up to 3 times a day if needed for runny nose Consider saline nasal rinses as needed for nasal symptoms. Use this before any medicated nasal sprays for best result Consider allergen immunotherapy if your symptoms are not well-controlled with the treatment plan as listed above. Written information provided for traditional allergen immunotherapy and [facility] therapy. Call your insurance first to find out about coverage. If interested call the clinic and make an appointment for about 3 weeks from now.   Migraine Continue follow-up with your neurologist for evaluation and treatment of migraine  Call the clinic if this treatment plan is not working well for you.  Follow up in 4 months or sooner if needed.  Reducing Pollen Exposure The American Academy of Allergy , Asthma and Immunology suggests the following steps to reduce your exposure to pollen during allergy  seasons. Do not hang sheets or clothing out to dry; pollen may collect on these items. Do not mow lawns or spend time around freshly cut grass; mowing stirs up pollen. Keep windows closed at night.  Keep car windows closed while driving. Minimize morning activities outdoors, a time when pollen counts are usually at their highest. Stay indoors as much as possible when pollen counts or humidity is high and on windy days when pollen tends to remain in the air longer. Use air conditioning when possible.  Many air conditioners have filters that trap the pollen spores. Use a HEPA room air filter to remove  pollen form the indoor air you breathe.  Control of Mold Allergen Mold and fungi can grow on a variety of surfaces provided certain temperature and moisture conditions exist.  Outdoor molds grow on plants, decaying vegetation and soil.  The major outdoor mold, Alternaria and Cladosporium, are found in very high numbers during hot and dry conditions.  Generally, a late Summer - Fall peak is seen for common outdoor fungal spores.  Rain will temporarily lower outdoor mold spore count, but counts rise rapidly when the rainy period ends.  The most important indoor molds are Aspergillus and Penicillium.  Dark, humid and poorly ventilated basements are ideal sites for mold growth.  The next most common sites of mold growth are the bathroom and the kitchen.  Outdoor [employer] Use air conditioning and keep windows closed Avoid exposure to decaying vegetation. Avoid leaf raking. Avoid grain handling. Consider wearing a face mask if working in moldy areas.  Indoor Mold Control Maintain humidity below 50%. Clean washable surfaces with 5% bleach solution. Remove sources e.g. Contaminated carpets.  Control of Dog or Cat Allergen Avoidance is the best way to manage a dog or cat allergy . If you have a dog or cat and are allergic to dog or cats, consider removing the dog or cat from the home. If you have a dog or cat but don't want to find it a new home, or if your family wants a pet even though someone in the household is allergic, here are some strategies that may help keep symptoms at bay:  Keep the pet out of your bedroom and restrict it  to only a few rooms. Be advised that keeping the dog or cat in only one room will not limit the allergens to that room. Don't pet, hug or kiss the dog or cat; if you do, wash your hands with soap and water. High-efficiency particulate air (HEPA) cleaners run continuously in a bedroom or living room can reduce allergen levels over time. Regular use of a high-efficiency  vacuum cleaner or a central vacuum can reduce allergen levels. Giving your dog or cat a bath at least once a week can reduce airborne allergen.

## 2023-10-07 NOTE — Progress Notes (Signed)
 [address] [place] [address] Dept: [phone]  FOLLOW UP NOTE  Patient ID: DAILY DOE, female    DOB: 05-Dec-1955  Age: 68 y.o. MRN: [id number] Date of Office Visit: 10/07/2023  Assessment  Chief Complaint: Follow-up  HPI Christina Lewis is a 68 year old female who presents to the clinic for follow-up visit.  She was last seen in this clinic on 07/25/2023 by Dr. Jeneal for allergy  skin test for evaluation of allergic rhinitis.  She also has been evaluated for migraine and sleep apnea with inspire implant.    At today's visit, she reports her allergic rhinitis has been only moderately well-controlled with symptoms including copious postnasal drainage and nasal congestion about 2 to 3 days a week.  She continues carbinoxamine  4 mg tablets twice a day, montelukast  10 mg once a day, and uses budesonide  nasal rinse twice a day.  She reports that she has previously received allergy  injections several times with the longest duration 3 years.  She is interested in beginning allergen immunotherapy.  Her last environmental allergy  skin testing on 07/25/2023 was positive to grass pollen, weed pollen, tree pollen, outdoor mold, and cat.  Written information has been provided about traditional AIT and Rush.  She continues to follow-up with her neurologist for evaluation and treatment of migraines.  She reports that she is getting an IV medication with significant relief of migraines.  Her current medications are listed in the chart.  Drug Allergies:  Allergies  Allergen Reactions   Sulfa Antibiotics Anaphylaxis and Other (See Comments)   Sulfamethoxazole-Trimethoprim Anaphylaxis   Atorvastatin Other (See Comments)    Severe Muscle and Joint pain with ALL statins    Codeine Itching   Erythromycin Nausea And Vomiting   Metronidazole  Diarrhea and Other (See Comments)   Morphine And Codeine Itching   Semaglutide Nausea And Vomiting    Ozempic*    Physical Exam: BP  120/74 (BP Location: Left Arm, Patient Position: Sitting, Cuff Size: Normal)   Pulse 83   Temp (!) 97.3 F (36.3 C) (Temporal)   Resp 18   Ht 5' 3.39 (1.61 m)   Wt 165 lb 6.4 oz (75 kg)   SpO2 96%   BMI 28.94 kg/m    Physical Exam Vitals reviewed.  Constitutional:      Appearance: Normal appearance.  HENT:     Head: Normocephalic and atraumatic.     Right Ear: Tympanic membrane normal.     Left Ear: Tympanic membrane normal.     Nose:     Comments: Bilateral nares edematous with thin clear nasal drainage noted.  Pharynx normal.  Ears normal.  Eyes normal.    Mouth/Throat:     Pharynx: Oropharynx is clear.  Eyes:     Conjunctiva/sclera: Conjunctivae normal.  Cardiovascular:     Rate and Rhythm: Normal rate and regular rhythm.     Heart sounds: Normal heart sounds. No murmur heard. Pulmonary:     Effort: Pulmonary effort is normal.     Breath sounds: Normal breath sounds.     Comments: Lungs clear to auscultation Musculoskeletal:        General: Normal range of motion.     Cervical back: Normal range of motion and neck supple.  Skin:    General: Skin is warm and dry.  Neurological:     Mental Status: She is alert and oriented to person, place, and time.  Psychiatric:        Mood and Affect: Mood normal.  Behavior: Behavior normal.        Thought Content: Thought content normal.        Judgment: Judgment normal.     Assessment and Plan: 1. Chronic migraine w/o aura w/o status migrainosus, not intractable   2. Chronic rhinosinusitis     Patient Instructions  Allergic rhinitis Continue allergen avoidance measures directed toward grass pollen, weed pollen, tree pollen, outdoor mold, and cat as listed below Continue montelukast  10 mg once a day to prevent allergy  symptoms Continue carbinoxamine  4 mg tablets 1 to 2 tablets once or twice a day if needed for nasal symptoms Continue budesonide  nasal rinses up to twice a day if needed for stuffy nose Continue  ipratropium nasal spray 2 sprays in each nostril up to 3 times a day if needed for runny nose Consider saline nasal rinses as needed for nasal symptoms. Use this before any medicated nasal sprays for best result Consider allergen immunotherapy if your symptoms are not well-controlled with the treatment plan as listed above. Written information provided for traditional allergen immunotherapy and RUSH therapy. Call your insurance first to find out about coverage. If interested call the clinic and make an appointment for about 3 weeks from now.   Migraine Continue follow-up with your neurologist for evaluation and treatment of migraine  Call the clinic if this treatment plan is not working well for you.  Follow up in 4 months or sooner if needed.  Return in about 4 months (around 02/06/2024), or if symptoms worsen or fail to improve.    Thank you for the opportunity to care for this patient.  Please do not hesitate to contact me with questions.  Arlean Mutter, FNP [facility] 

## 2023-10-11 DIAGNOSIS — G43711 Chronic migraine without aura, intractable, with status migrainosus: Secondary | ICD-10-CM | POA: Diagnosis not present

## 2023-10-12 ENCOUNTER — Encounter: Payer: Self-pay | Admitting: Acute Care

## 2023-10-12 ENCOUNTER — Ambulatory Visit (INDEPENDENT_AMBULATORY_CARE_PROVIDER_SITE_OTHER): Admitting: Acute Care

## 2023-10-12 VITALS — BP 121/81 | HR 79 | Temp 98.5°F | Ht 64.0 in | Wt 165.4 lb

## 2023-10-12 DIAGNOSIS — R911 Solitary pulmonary nodule: Secondary | ICD-10-CM | POA: Diagnosis not present

## 2023-10-12 DIAGNOSIS — R9389 Abnormal findings on diagnostic imaging of other specified body structures: Secondary | ICD-10-CM

## 2023-10-12 DIAGNOSIS — R0602 Shortness of breath: Secondary | ICD-10-CM | POA: Diagnosis not present

## 2023-10-12 DIAGNOSIS — R0609 Other forms of dyspnea: Secondary | ICD-10-CM | POA: Diagnosis not present

## 2023-10-12 NOTE — Patient Instructions (Signed)
 It is good to see you today. We will do a 6 month follow up CT Chest 02/2024 to monitor the small right middle lobe nodule. You will follow up with me 1-2 weeks after the scan to review results. I have also ordered Pulmonary Function Tests to better evaluate your lung function.  You will get a call to get this scheduled closer to the time You will follow up to review them afterward to determine if you would benefit from an inhaler.  Call if you need us  sooner, or experience unintentional weight loss , or blood in your sputum when you cough.  Please contact office for sooner follow up if symptoms do not improve or worsen or seek emergency care

## 2023-10-12 NOTE — Progress Notes (Signed)
 History of Present Illness Christina Lewis is a 68 y.o. female never smoker  referred for a lung nodule consult for abnormal chest imaging. She will be followed by Dr. Shelah   10/12/2023 Discussed the use of AI scribe software for clinical note transcription with the patient, who gave verbal consent to proceed.  History of Present Illness Christina Lewis is a 68 year old female with rheumatoid arthritis who presents for evaluation of a right middle lobe lung nodule. She was referred by Dr. Ballen for evaluation of a lung nodule found incidentally on a calcium scoring scan.  A calcium scoring scan incidentally revealed a small right middle lobe lung nodule. She has been on methotrexate  for rheumatoid arthritis for five years and also takes Plaquenil . She has a history of significant secondhand smoke exposure, having lived with a smoker for 46 years. A chest x-ray in October 2024 following an Inspire implant surgery did not show any abnormalities. She is concerned about the lung nodule due to her husband's history of lung cancer, second hand smoke exposure,  and her use of methotrexate .  She experiences shortness of breath with exertion . She has a nonproductive cough and occasional wheezing, as noted by her sister. She has not had pulmonary function testing done previously.She denies any weight loss or hemoptysis.  Her family history includes six maternal uncles with prostate cancer that metastasized to other organs. She also has a family history of autoimmune conditions, including rheumatoid arthritis and psoriatic arthritis in her mother and sisters. Pt. Herself has Sjogren's syndrome.   She has seasonal allergies and a history of GERD, for which she takes 80 mg of Prilosec. She also reports a swallowing issue that has been evaluated in the past. She lives with her sister and is retired, having previously worked as a Runner, broadcasting/film/video. She had potential mold exposure in her office for five years.  We  discussed that the lung nodule is very small, less than 4 mm. However, due to her immunosuppressive medications and her second hand smoker exposure, she is higher risk. Plan will be for a 6 month follow up CT Chest for surveillance . Pt. Is in agreement with this plan. She will follow up after the scan to review the results.        Test Results: 09/02/2023 Cardiac Calcium scoring scan>> personally reviewed by me Moderate hiatal hernia. 2. Punctate subpleural right middle lobe nodule. Per Fleischner Society Guidelines, no routine follow-up imaging is recommended. These guidelines do not apply to immunocompromised patients and patients with cancer. Follow up in patients with significant comorbidities as clinically warranted.    Latest Ref Rng & Units 08/15/2023   11:06 AM 05/03/2023   10:35 AM 02/11/2023   10:35 AM  CBC  WBC 3.8 - 10.8 Thousand/uL 16.6  12.0  11.2   Hemoglobin 11.7 - 15.5 g/dL 86.0  86.0  85.4   Hematocrit 35.0 - 45.0 % 41.8  42.3  45.1   Platelets 140 - 400 Thousand/uL 399  417  367        Latest Ref Rng & Units 08/15/2023   11:06 AM 05/03/2023   10:35 AM 02/11/2023   10:35 AM  BMP  Glucose 65 - 99 mg/dL 881  80  83   BUN 7 - 25 mg/dL 21  25  19    Creatinine 0.50 - 1.05 mg/dL 9.27  9.23  9.20   BUN/Creat Ratio 6 - 22 (calc) SEE NOTE:  SEE NOTE:  24  Sodium 135 - 146 mmol/L 139  142  142   Potassium 3.5 - 5.3 mmol/L 4.6  4.9  4.1   Chloride 98 - 110 mmol/L 103  107  105   CO2 20 - 32 mmol/L 28  27  22    Calcium 8.6 - 10.4 mg/dL 9.9  89.8  89.9     BNP No results found for: BNP  ProBNP No results found for: PROBNP  PFT No results found for: FEV1PRE, FEV1POST, FVCPRE, FVCPOST, TLC, DLCOUNC, PREFEV1FVCRT, PSTFEV1FVCRT  No results found.   Past medical hx Past Medical History:  Diagnosis Date   Achilles tendonitis    right foot   Allergy     Anemia    Anxiety    Arthritis    Cataract 2020   Depression    Diabetes mellitus     type 2   Eczema    Fibromyalgia    GERD (gastroesophageal reflux disease)    Gout    no current problem   Hematuria    History of kidney stones    passed stones and also had surgery to remove one   History of tics    Hyperlipidemia 2007   Ineffective esophageal motility    tx with prilosec, essophageal was stretched   Interstitial cystitis 2016   Migraines    Osteoarthritis    PCOS (polycystic ovarian syndrome) 1993   Plantar fasciitis, bilateral    Pneumonia 2019   PONV (postoperative nausea and vomiting)    Recurrent upper respiratory infection (URI)    Sleep apnea    does not use CPAP   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 She has been exposed to tobacco smoke. She has never used smokeless tobacco. She reports that she does not drink alcohol  and does not use drugs.  Tobacco Cessation: Counseling given: Not Answered Never smoker   Past surgical hx, Family hx, Social hx all reviewed.  Current Outpatient Medications on File Prior to Visit  Medication Sig   baclofen  (LIORESAL ) 10 MG tablet TAKE 1 TABLET BY MOUTH IN THE MORNING AND 1 TAB AT NOON   budesonide  (PULMICORT ) 0.5 MG/2ML nebulizer solution Take 2 mLs (0.5 mg total) by nebulization in the morning and at bedtime. MIX 1 VIAL WITH 250CC OF NORMAL SALINE FOR SINUS WASH DAILY AS NEEDED   Carbinoxamine  Maleate 4 MG TABS Take 2 tablets (8 mg total) by mouth 2 (two) times daily as needed (allergies).   cholecalciferol (VITAMIN D ) 25 MCG (1000 UNIT) tablet Take 1,000 Units by mouth daily.   Eptinezumab -jjmr (VYEPTI  IV) Inject 300 mg into the vein every 3  (three) months.   folic acid  (FOLVITE ) 1 MG tablet TAKE TWO TABLETS BY MOUTH EVERY DAY   gabapentin (NEURONTIN) 300 MG capsule Take 300 mg by mouth 3 (three) times daily.   hydroxychloroquine  (PLAQUENIL ) 200 MG tablet TAKE 1 TABLET TWICE DAILY MONDAY THRU FRIDAY FOR RHEUMATOID ARTHRTITIS.   Lancets (ONETOUCH DELICA PLUS LANCET33G) MISC SMARTSIG:1 Topical Daily   MAGNESIUM  PO Take 500 mg by mouth  at bedtime.   methotrexate  50 MG/2ML injection Inject 0.8 mLs (20 mg total) into the skin once a week.   Multiple Vitamin (MULTIVITAMIN WITH MINERALS) TABS tablet Take 1 tablet by mouth daily.   omeprazole (PRILOSEC) 20 MG capsule Take 40 mg by mouth daily.   ondansetron  (ZOFRAN -ODT) 8 MG disintegrating tablet Take 8 mg by mouth every 8 (eight) hours as needed for nausea.    ONETOUCH VERIO test strip 1 each daily.   Polyvinyl Alcohol -Povidone (REFRESH OP) Apply to eye as needed.   Rimegepant Sulfate (NURTEC) 75 MG TBDP Take 1 tablet (75 mg total) by mouth daily as needed. NDC [address]-8, lot [id number] exp 09/2025   tacrolimus  (PROTOPIC ) 0.1 % ointment Apply topically 2 (two) times daily.   tiZANidine  (ZANAFLEX ) 4 MG tablet TAKE 1 TABLET BY MOUTH AT BEDTIME AS NEEDED   TUBERCULIN SYR 1CC/27GX1/2 (B-D TB SYRINGE 1CC/27GX1/2) 27G X 1/2 1 ML MISC 12 Syringes by Does not apply route once a week.   No current facility-administered medications on file prior to visit.     Allergies  Allergen Reactions   Sulfa Antibiotics Anaphylaxis and Other (See Comments)   Sulfamethoxazole-Trimethoprim Anaphylaxis   Atorvastatin Other (See Comments)    Severe Muscle and Joint pain with ALL statins    Codeine Itching   Erythromycin Nausea And Vomiting   Metronidazole  Diarrhea and Other (See Comments)   Morphine And Codeine Itching   Semaglutide Nausea And Vomiting    Ozempic*    Review Of Systems:  Constitutional:   No  weight loss, night sweats,  Fevers, chills, fatigue, or  lassitude.  HEENT:   No  headaches,  Difficulty swallowing,  Tooth/dental problems, or  Sore throat,                No sneezing, itching, ear ache, nasal congestion, post nasal drip,   CV:  No chest pain,  Orthopnea, PND, swelling in lower extremities, anasarca, dizziness, palpitations, syncope.   GI  No heartburn, indigestion, abdominal pain, nausea, vomiting, diarrhea, change in bowel habits, loss of appetite, bloody stools.   Resp: + shortness of breath with exertion or at rest.  No excess mucus, no productive cough,  + non-productive cough,  No coughing up of blood.  No change in color of mucus.  + wheezing.  No chest wall deformity  Skin: no rash or lesions.  GU: no dysuria, change in color of urine, no urgency or frequency.  No flank pain, no hematuria   MS:  No joint pain or swelling.  No decreased range of motion.  No back pain.  Psych:  No change in mood or affect. No depression or anxiety.  No memory loss.   Vital Signs BP 121/81   Pulse 79   Temp 98.5 F (36.9 C) (Temporal)   Ht 5' 4 (1.626 m)   Wt 165 lb 6.4 oz (75 kg)   SpO2 94%   BMI 28.39 kg/m    Physical Exam:  General- No distress,  A&Ox3, pleasant ENT: No sinus tenderness, TM clear, pale nasal mucosa, no oral exudate,no post nasal drip, no LAN Cardiac: S1, S2, regular rate and rhythm, no murmur Chest: No wheeze/ rales/ dullness; no accessory muscle use, no nasal flaring, no sternal retractions, slightly diminished per bases Abd.: Soft Non-tender, ND, BS +, Body mass index is 28.39 kg/m.  Ext: No clubbing cyanosis, edema, no obvious deformities Neuro:  normal strength, MAE x 4, A&O x 3, appropriate Skin: No rashes, warm and dry,  no obvious lesions  Psych: normal mood and behavior   Assessment/Plan  Assessment and Plan Assessment & Plan Small right middle lobe pulmonary nodule in the setting of immunosuppression and rheumatoid arthritis Never smoker  Second hand smoke exposure Plan - Schedule follow-up CT scan of the chest  in January 2026. - Follow up with provider 1-2 weeks post-CT scan to review results.  Evaluation of dyspnea and nonproductive cough Plan - Order pulmonary function tests within a month, with follow up to review results. - Consider primary care evaluation for diaphoresis and hormonal imbalances.  Seasonal allergic rhinitis Seasonal allergies present.  Previous allergy  injections.  Allergist recommends restarting injections. - Restart allergy  injections as recommended by allergist.  Gastroesophageal reflux disease (GERD) GERD managed with Prilosec 80 mg. Swallowing issue previously evaluated. - per GI physician.  We will do a 6 month follow up CT Chest 02/2024 to monitor the small right middle lobe nodule. You will follow up with me 1-2 weeks after the scan to review results. I have also ordered Pulmonary Function Tests to better evaluate your lung function.  You will get a call to get this scheduled closer to the time You will follow up to review them afterward to determine if you would benefit from an inhaler.  Call if you need us  sooner, or experience unintentional weight loss , or blood in your sputum when you cough.  Please contact office for sooner follow up if symptoms do not improve or worsen or seek emergency care     I spent 20 minutes dedicated to the care of this patient on the date of this encounter to include pre-visit review of records, face-to-face time with the patient discussing conditions above, post visit ordering of testing, clinical documentation with the electronic health record, making appropriate referrals as documented, and communicating necessary information to the patient's healthcare team.      Lauraine JULIANNA Lites, NP 10/12/2023  11:24 AM

## 2023-10-13 ENCOUNTER — Encounter: Payer: Self-pay | Admitting: Family Medicine

## 2023-10-13 ENCOUNTER — Ambulatory Visit (INDEPENDENT_AMBULATORY_CARE_PROVIDER_SITE_OTHER): Admitting: Family Medicine

## 2023-10-13 VITALS — BP 133/78 | HR 74 | Temp 97.5°F | Ht 64.0 in | Wt 166.8 lb

## 2023-10-13 DIAGNOSIS — J0110 Acute frontal sinusitis, unspecified: Secondary | ICD-10-CM | POA: Diagnosis not present

## 2023-10-13 MED ORDER — DOXYCYCLINE HYCLATE 100 MG PO TABS: 100.0000 mg | ORAL_TABLET | Freq: Two times a day (BID) | ORAL | 0 refills | Status: AC

## 2023-10-13 MED ORDER — PREDNISONE 20 MG PO TABS: 40.0000 mg | ORAL_TABLET | Freq: Every day | ORAL | 0 refills | Status: AC

## 2023-10-13 NOTE — Progress Notes (Signed)
 Subjective:  Patient ID: Christina Lewis, female    DOB: 01-20-1956, 68 y.o.   MRN: [id number]  Patient Care Team: Severa Rock HERO, FNP as PCP - General (Family Medicine) Gladis Gearing, OD (Optometry)   Chief Complaint:  Headache and Nasal Congestion (Runny nose- x 4 days- otc sudafed )   HPI: Christina Lewis is a 68 y.o. female presenting on 10/13/2023 for Headache and Nasal Congestion (Runny nose- x 4 days- otc sudafed )   Christina Lewis is a 68 year old female who presents with a persistent headache.  She has been experiencing a headache for the past couple of days, located across the front of her head. This headache differs from her usual migraines. Despite taking various medications, including leftover hydrocodone  from a previous back surgery, she has not found relief.  She has been using antihistamines and a steroid nasal wash, which have provided some relief. She experiences increased nasal congestion at night, described as 'sniff, sniff', but denies any fever. She is currently taking an antihistamine, although she is unsure of the exact name, and it is listed in her medical records.          Relevant past medical, surgical, family, and social history reviewed and updated as indicated.  Allergies and medications reviewed and updated. Data reviewed: Chart in Epic.   Past Medical History:  Diagnosis Date   Achilles tendonitis    right foot   Allergy     Anemia    Anxiety    Arthritis    Cataract 2020   Depression    Diabetes mellitus    type 2   Eczema    Fibromyalgia    GERD (gastroesophageal reflux disease)    Gout    no current problem   Hematuria    History of kidney stones    passed stones and also had surgery to remove one   History of tics    Hyperlipidemia 2007   Ineffective esophageal motility    tx with prilosec, essophageal was stretched   Interstitial cystitis 2016   Migraines    Osteoarthritis    PCOS (polycystic ovarian syndrome) 1993    Plantar fasciitis, bilateral    Pneumonia 2019   PONV (postoperative nausea and vomiting)    Recurrent upper respiratory infection (URI)    Sleep apnea    does not use CPAP   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, took medication for a year   Thyroid  nodule    Dr Bella is monitoring every year    Past Surgical History:  Procedure Laterality Date   ABDOMINAL HYSTERECTOMY  2000   APPENDECTOMY  1976   BALLOON DILATION N/A 11/03/2021   Procedure: MERRILL DILATION;  Surgeon: Saintclair Jasper, MD;  Location: [facility];  Service: Gastroenterology;  Laterality: N/A;   BOTOX  INJECTION N/A 11/03/2021   Procedure: BOTOX  INJECTION;  Surgeon: Saintclair Jasper, MD;  Location: [facility];  Service: Gastroenterology;  Laterality: N/A;   BREAST LUMPECTOMY  1989   lt-negative   CARDIOVASCULAR STRESS TEST  2000   CATARACT EXTRACTION W/PHACO Left 12/07/2019   Procedure: CATARACT EXTRACTION PHACO AND INTRAOCULAR LENS PLACEMENT (IOC);  Surgeon: Harrie Agent, MD;  Location: [facility];  Service: Ophthalmology;  Laterality: Left;  CDE: 5.55   CHOLECYSTECTOMY  1985   COLONOSCOPY     DRUG INDUCED ENDOSCOPY Bilateral 09/29/2022   Procedure: DRUG INDUCED ENDOSCOPY;  Surgeon: Okey Burns, MD;  Location: [facility];  Service: ENT;  Laterality: Bilateral;  15 MINUTES   ESOPHAGEAL MANOMETRY N/A 08/24/2021   Procedure: ESOPHAGEAL MANOMETRY (EM);  Surgeon: Saintclair Jasper, MD;  Location: [facility];  Service: Gastroenterology;  Laterality: N/A;   ESOPHAGOGASTRODUODENOSCOPY N/A 11/03/2021   Procedure: ESOPHAGOGASTRODUODENOSCOPY (EGD);  Surgeon: Saintclair Jasper, MD;  Location: [facility];  Service: Gastroenterology;  Laterality: N/A;   EYE SURGERY  2021   HYSTERECTOMY ABDOMINAL WITH SALPINGECTOMY Bilateral 2000   IMPLANTATION OF HYPOGLOSSAL NERVE STIMULATOR Right 11/24/2022   Procedure: IMPLANTATION OF HYPOGLOSSAL NERVE STIMULATOR;  Surgeon: Okey Burns, MD;  Location: [facility];  Service:  ENT;  Laterality: Right;   KIDNEY STONE SURGERY Right 2014   with stent placement in OR   KNEE ARTHROSCOPY Right 08/22/2013   Procedure: ARTHROSCOPY RIGHT KNEE FOR INFECTION LAVAGE AND DRAINAGE;  Surgeon: LELON JONETTA Shari Mickey., MD;  Location: [facility];  Service: Orthopedics;  Laterality: Right;   KNEE ARTHROSCOPY WITH PATELLA RECONSTRUCTION Right 08/06/2013   Procedure: RIGHT KNEE ARTHROSCOPY WITH MENISCECTOMY MEDIAL, ARTHROSCOPY KNEE WITH DEBRIDEMENT/SHAVING (CHONDROPLASTY) ;  Surgeon: LELON JONETTA Shari Mickey., MD;  Location: [facility];  Service: Orthopedics;  Laterality: Right;   LUMBAR LAMINECTOMY/DECOMPRESSION MICRODISCECTOMY Right 09/11/2019   Procedure: Right Lumbar Three-Four Microdiscectomy;  Surgeon: Unice Pac, MD;  Location: [facility];  Service: Neurosurgery;  Laterality: Right;  Right Lumbar Three-Four Microdiscectomy   NASAL SINUS SURGERY     x4   OOPHORECTOMY     SHOULDER SURGERY  2011   right shoulder   SPINE SURGERY  2020   THYROID  SURGERY  1994   TONSILLECTOMY Bilateral 1974    Social History   Socioeconomic History   Marital status: Widowed    Spouse name: Elsie   Number of children: Not on file   Years of education: Not on file   Highest education level: Bachelor's degree (e.g., BA, AB, BS)  Occupational History   Occupation: retired  Tobacco Use   Smoking status: Never    Passive exposure: Past   Smokeless tobacco: Never  Vaping Use   Vaping status: Never Used  Substance and Sexual Activity   Alcohol  use: Never   Drug use: Never   Sexual activity: Not Currently    Birth control/protection: Post-menopausal    Comment: Hysterectomy  Other Topics Concern   Not on file  Social History Narrative   Caffiene 4-5 12 oz cans soda.   Working retired - special ed.   Live with home no kids   Social Drivers of Health   Financial Resource Strain: Low Risk  (09/25/2023)   Overall Financial Resource Strain (CARDIA)    Difficulty of Paying  Living Expenses: Not very hard  Food Insecurity: No Food Insecurity (09/25/2023)   Hunger Vital Sign    Worried About Running Out of Food in the Last Year: Never true    Ran Out of Food in the Last Year: Never true  Transportation Needs: No Transportation Needs (09/25/2023)   PRAPARE - Administrator, Civil Service (Medical): No    Lack of Transportation (Non-Medical): No  Physical Activity: Inactive (09/25/2023)   Exercise Vital Sign    Days of Exercise per Week: 0 days    Minutes of Exercise per Session: Not on file  Stress: No Stress Concern Present (09/25/2023)   [employer] of Occupational Health - Occupational Stress Questionnaire    Feeling of Stress: Only a little  Social Connections: Moderately Isolated (09/25/2023)   Social Connection and Isolation Panel  Frequency of Communication with Friends and Family: More than three times a week    Frequency of Social Gatherings with Friends and Family: More than three times a week    Attends Religious Services: 1 to 4 times per year    Active Member of [employer] or Organizations: No    Attends Banker Meetings: Not on file    Marital Status: Widowed  Intimate Partner Violence: Not At Risk (02/18/2023)   Humiliation, Afraid, Rape, and Kick questionnaire    Fear of Current or Ex-Partner: No    Emotionally Abused: No    Physically Abused: No    Sexually Abused: No    Outpatient Encounter Medications as of 10/13/2023  Medication Sig   baclofen  (LIORESAL ) 10 MG tablet TAKE 1 TABLET BY MOUTH IN THE MORNING AND 1 TAB AT NOON   budesonide  (PULMICORT ) 0.5 MG/2ML nebulizer solution Take 2 mLs (0.5 mg total) by nebulization in the morning and at bedtime. MIX 1 VIAL WITH 250CC OF NORMAL SALINE FOR SINUS WASH DAILY AS NEEDED   Carbinoxamine  Maleate 4 MG TABS Take 2 tablets (8 mg total) by mouth 2 (two) times daily as needed (allergies).   cholecalciferol (VITAMIN D ) 25 MCG (1000 UNIT) tablet Take 1,000 Units by mouth daily.    doxycycline  (VIBRA -TABS) 100 MG tablet Take 1 tablet (100 mg total) by mouth 2 (two) times daily for 10 days. 1 po bid   Eptinezumab -jjmr (VYEPTI  IV) Inject 300 mg into the vein every 3 (three) months.   folic acid  (FOLVITE ) 1 MG tablet TAKE TWO TABLETS BY MOUTH EVERY DAY   gabapentin (NEURONTIN) 300 MG capsule Take 300 mg by mouth 3 (three) times daily.   hydroxychloroquine  (PLAQUENIL ) 200 MG tablet TAKE 1 TABLET TWICE DAILY MONDAY THRU FRIDAY FOR RHEUMATOID ARTHRTITIS.   Lancets (ONETOUCH DELICA PLUS LANCET33G) MISC SMARTSIG:1 Topical Daily   MAGNESIUM  PO Take 500 mg by mouth at bedtime.   methotrexate  50 MG/2ML injection Inject 0.8 mLs (20 mg total) into the skin once a week.   Multiple Vitamin (MULTIVITAMIN WITH MINERALS) TABS tablet Take 1 tablet by mouth daily.   omeprazole (PRILOSEC) 20 MG capsule Take 40 mg by mouth daily.   ondansetron  (ZOFRAN -ODT) 8 MG disintegrating tablet Take 8 mg by mouth every 8 (eight) hours as needed for nausea.    ONETOUCH VERIO test strip 1 each daily.   Polyvinyl Alcohol -Povidone (REFRESH OP) Apply to eye as needed.   predniSONE  (DELTASONE ) 20 MG tablet Take 2 tablets (40 mg total) by mouth daily with breakfast for 5 days.   Rimegepant Sulfate (NURTEC) 75 MG TBDP Take 1 tablet (75 mg total) by mouth daily as needed. NDC [address]-8, lot [id number] exp 09/2025   tacrolimus  (PROTOPIC ) 0.1 % ointment Apply topically 2 (two) times daily.   tiZANidine  (ZANAFLEX ) 4 MG tablet TAKE 1 TABLET BY MOUTH AT BEDTIME AS NEEDED   TUBERCULIN SYR 1CC/27GX1/2 (B-D TB SYRINGE 1CC/27GX1/2) 27G X 1/2 1 ML MISC 12 Syringes by Does not apply route once a week.   No facility-administered encounter medications on file as of 10/13/2023.    Allergies  Allergen Reactions   Sulfa Antibiotics Anaphylaxis and Other (See Comments)   Sulfamethoxazole-Trimethoprim Anaphylaxis   Atorvastatin Other (See Comments)    Severe Muscle and Joint pain with ALL statins    Codeine Itching    Erythromycin Nausea And Vomiting   Metronidazole  Diarrhea and Other (See Comments)   Morphine And Codeine Itching   Semaglutide Nausea And Vomiting  Ozempic*    Pertinent ROS per HPI, otherwise unremarkable      Objective:  BP 133/78   Pulse 74   Temp (!) 97.5 F (36.4 C)   Ht 5' 4 (1.626 m)   Wt 166 lb 12.8 oz (75.7 kg)   SpO2 95%   BMI 28.63 kg/m    Wt Readings from Last 3 Encounters:  10/13/23 166 lb 12.8 oz (75.7 kg)  10/12/23 165 lb 6.4 oz (75 kg)  10/07/23 165 lb 6.4 oz (75 kg)    Physical Exam Vitals and nursing note reviewed.  Constitutional:      General: She is not in acute distress.    Appearance: Normal appearance. She is well-developed and well-groomed. She is not ill-appearing, toxic-appearing or diaphoretic.  HENT:     Head: Normocephalic and atraumatic.     Right Ear: A middle ear effusion is present.     Left Ear: A middle ear effusion is present.     Nose: Congestion present.     Right Turbinates: Enlarged.     Left Turbinates: Enlarged.     Right Sinus: Frontal sinus tenderness present.     Left Sinus: Frontal sinus tenderness present.     Mouth/Throat:     Lips: Pink.     Mouth: Mucous membranes are moist.     Pharynx: Posterior oropharyngeal erythema and postnasal drip present. No oropharyngeal exudate.  Eyes:     Conjunctiva/sclera: Conjunctivae normal.     Pupils: Pupils are equal, round, and reactive to light.  Cardiovascular:     Rate and Rhythm: Normal rate and regular rhythm.     Heart sounds: Normal heart sounds.  Pulmonary:     Effort: Pulmonary effort is normal.     Breath sounds: Normal breath sounds.  Musculoskeletal:     Cervical back: Neck supple.  Lymphadenopathy:     Cervical: No cervical adenopathy.  Skin:    General: Skin is warm and dry.     Capillary Refill: Capillary refill takes less than 2 seconds.  Neurological:     General: No focal deficit present.     Mental Status: She is alert and oriented to person,  place, and time.  Psychiatric:        Mood and Affect: Mood normal.        Behavior: Behavior normal. Behavior is cooperative.        Thought Content: Thought content normal.        Judgment: Judgment normal.     Results for orders placed or performed in visit on 08/15/23  Comprehensive metabolic panel with GFR   Collection Time: 08/15/23 11:06 AM  Result Value Ref Range   Glucose, Bld 118 (H) 65 - 99 mg/dL   BUN 21 7 - 25 mg/dL   Creat 9.27 9.49 - 8.94 mg/dL   eGFR 92 > OR = 60 fO/fpw/8.26f7   BUN/Creatinine Ratio SEE NOTE: 6 - 22 (calc)   Sodium 139 135 - 146 mmol/L   Potassium 4.6 3.5 - 5.3 mmol/L   Chloride 103 98 - 110 mmol/L   CO2 28 20 - 32 mmol/L   Calcium 9.9 8.6 - 10.4 mg/dL   Total Protein 6.6 6.1 - 8.1 g/dL   Albumin 4.6 3.6 - 5.1 g/dL   Globulin 2.0 1.9 - 3.7 g/dL (calc)   AG Ratio 2.3 1.0 - 2.5 (calc)   Total Bilirubin 0.2 0.2 - 1.2 mg/dL   Alkaline phosphatase (APISO) 65 37 - 153 U/L  AST 16 10 - 35 U/L   ALT 18 6 - 29 U/L  CBC with Differential/Platelet   Collection Time: 08/15/23 11:06 AM  Result Value Ref Range   WBC 16.6 (H) 3.8 - 10.8 Thousand/uL   RBC 4.41 3.80 - 5.10 Million/uL   Hemoglobin 13.9 11.7 - 15.5 g/dL   HCT 58.1 64.9 - 54.9 %   MCV 94.8 80.0 - 100.0 fL   MCH 31.5 27.0 - 33.0 pg   MCHC 33.3 32.0 - 36.0 g/dL   RDW 86.9 88.9 - 84.9 %   Platelets 399 140 - 400 Thousand/uL   MPV 10.1 7.5 - 12.5 fL   Neutro Abs 12,234 (H) 1,500 - 7,800 cells/uL   Absolute Lymphocytes 3,104 850 - 3,900 cells/uL   Absolute Monocytes 913 200 - 950 cells/uL   Eosinophils Absolute 133 15 - 500 cells/uL   Basophils Absolute 216 (H) 0 - 200 cells/uL   Neutrophils Relative % 73.7 %   Total Lymphocyte 18.7 %   Monocytes Relative 5.5 %   Eosinophils Relative 0.8 %   Basophils Relative 1.3 %   Smear Review         Pertinent labs & imaging results that were available during my care of the patient were reviewed by me and considered in my medical decision  making.  Assessment & Plan:  Gagandeep was seen today for headache and nasal congestion.  Diagnoses and all orders for this visit:  Acute non-recurrent frontal sinusitis -     doxycycline  (VIBRA -TABS) 100 MG tablet; Take 1 tablet (100 mg total) by mouth 2 (two) times daily for 10 days. 1 po bid -     predniSONE  (DELTASONE ) 20 MG tablet; Take 2 tablets (40 mg total) by mouth daily with breakfast for 5 days.     Acute sinusitis Headache for a couple of days, described as pressure across the front of the head, possibly related to sinus issues. No fever reported. Immunosuppressed state considered in treatment decision. Differential includes sinusitis due to symptoms and response to antihistamine and nasal spray. - Prescribe doxycycline  for 10 days to address potential bacterial infection - Prescribe prednisone  for 5 days to reduce inflammation and facilitate drainage - Encourage increased water intake to thin mucus and promote drainage - Continue nasal spray and antihistamine - Send prescriptions to [facility] - Advise to report if symptoms worsen  Allergic rhinitis Chronic sniffing and nasal congestion, especially at night. Currently managed with antihistamine and steroid nasal spray. Plans to start allergy  injections soon. - Continue current antihistamine and steroid nasal spray - Plan to start allergy  injections          Continue all other maintenance medications.  Follow up plan: Return if symptoms worsen or fail to improve.   Continue healthy lifestyle choices, including diet (rich in fruits, vegetables, and lean proteins, and low in salt and simple carbohydrates) and exercise (at least 30 minutes of moderate physical activity daily).   The above assessment and management plan was discussed with the patient. The patient verbalized understanding of and has agreed to the management plan. Patient is aware to call the clinic if they develop any new symptoms or if symptoms persist or  worsen. Patient is aware when to return to the clinic for a follow-up visit. Patient educated on when it is appropriate to go to the emergency department.   Rosaline Bruns, FNP-C [facility] [phone]

## 2023-10-17 ENCOUNTER — Encounter: Payer: Self-pay | Admitting: Dermatology

## 2023-10-17 ENCOUNTER — Ambulatory Visit (INDEPENDENT_AMBULATORY_CARE_PROVIDER_SITE_OTHER): Admitting: Dermatology

## 2023-10-17 VITALS — BP 107/67 | HR 68

## 2023-10-17 DIAGNOSIS — L814 Other melanin hyperpigmentation: Secondary | ICD-10-CM | POA: Diagnosis not present

## 2023-10-17 DIAGNOSIS — D229 Melanocytic nevi, unspecified: Secondary | ICD-10-CM

## 2023-10-17 DIAGNOSIS — W908XXA Exposure to other nonionizing radiation, initial encounter: Secondary | ICD-10-CM | POA: Diagnosis not present

## 2023-10-17 DIAGNOSIS — L821 Other seborrheic keratosis: Secondary | ICD-10-CM

## 2023-10-17 DIAGNOSIS — Z1283 Encounter for screening for malignant neoplasm of skin: Secondary | ICD-10-CM

## 2023-10-17 DIAGNOSIS — L578 Other skin changes due to chronic exposure to nonionizing radiation: Secondary | ICD-10-CM

## 2023-10-17 DIAGNOSIS — D1801 Hemangioma of skin and subcutaneous tissue: Secondary | ICD-10-CM | POA: Diagnosis not present

## 2023-10-17 NOTE — Patient Instructions (Addendum)
 Date: Mon Oct 17 2023  Hello Christina Lewis,  Thank you for visiting today. Here is a summary of the key instructions:  - Medications:   - Continue using pimecrolimus cream for hand eczema as needed when itching starts   - Use clobetasol or betamethasone  for up to 3 weeks on hands if pimecrolimus is not enough in winter   - Apply clobetasol or betamethasone  to the spot on your shin to help it heal faster  - Skin Care:   - Consider using pimecrolimus every night in winter to help keep hand eczema clear  - Follow-up:   - Contact us  through MyChart if you need a refill of pimecrolimus   - Call our office if you need a new prescription for clobetasol   - If the spot on your shin doesn't heal within 8 weeks, call our office  Please reach out if you have any questions or concerns.  Warm regards,  Dr. [facility]on Len[facility]     Important Information  Due to recent changes in healthcare laws, you may see results of your pathology and/or laboratory studies on MyChart before the doctors have had a chance to review them. We understand that in some cases there may be results that are confusing or concerning to you. Please understand that not all results are received at the same time and often the doctors may need to interpret multiple results in order to provide you with the best plan of care or course of treatment. Therefore, we ask that you please give us  2 business days to thoroughly review all your results before contacting the office for clarification. Should we see a critical lab result, you will be contacted sooner.   If You Need Anything After Your Visit  If you have any questions or concerns for your doctor, please call our main line at [phone] If no one answers, please leave a voicemail as directed and we will return your call as soon as possible. Messages left after 4 pm will be answered the following business day.   You may also send us  a message via MyChart. We typically  respond to MyChart messages within 1-2 business days.  For prescription refills, please ask your pharmacy to contact our office. Our fax number is [phone].  If you have an urgent issue when the clinic is closed that cannot wait until the next business day, you can page your doctor at the number below.    Please note that while we do our best to be available for urgent issues outside of office hours, we are not available 24/7.   If you have an urgent issue and are unable to reach us , you may choose to seek medical care at your doctor's office, retail clinic, urgent care center, or emergency room.  If you have a medical emergency, please immediately call 911 or go to the emergency department. In the event of inclement weather, please call our main line at [phone] for an update on the status of any delays or closures.  Dermatology Medication Tips: Please keep the boxes that topical medications come in in order to help keep track of the instructions about where and how to use these. Pharmacies typically print the medication instructions only on the boxes and not directly on the medication tubes.   If your medication is too expensive, please contact our office at [phone] or send us  a message through MyChart.   We are unable to tell what your co-pay for medications will be in  advance as this is different depending on your insurance coverage. However, we may be able to find a substitute medication at lower cost or fill out paperwork to get insurance to cover a needed medication.   If a prior authorization is required to get your medication covered by your insurance company, please allow us  1-2 business days to complete this process.  Drug prices often vary depending on where the prescription is filled and some pharmacies may offer cheaper prices.  The website www.goodrx.com contains coupons for medications through different pharmacies. The prices here do not account for what the cost  may be with help from insurance (it may be cheaper with your insurance), but the website can give you the price if you did not use any insurance.  - You can print the associated coupon and take it with your prescription to the pharmacy.  - You may also stop by our office during regular business hours and pick up a GoodRx coupon card.  - If you need your prescription sent electronically to a different pharmacy, notify our office through [facility] or by phone at [phone]

## 2023-10-17 NOTE — Progress Notes (Signed)
   Total Body Skin Exam (TBSE) Visit   Subjective  Christina Lewis is a 68 y.o. female who presents for the following: Skin Cancer Screening and Full Body Skin Exam  Patient presents today for follow up visit for TBSE. Patient was last evaluated on 07/04/23 . Patient denies medication changes. Patient reports she does not have spots, moles and lesions of concern to be evaluated. Patient reports throughout her lifetime she has had moderate sun exposure. Currently, patient reports if she has excessive sun exposure, she does apply sunscreen and/or wears protective coverings. Patient reports she does not have hx of bx. Patient reports  family history of skin cancers (Mother- Melanoma, grandad and uncle). The patient has spots, moles and lesions to be evaluated, some may be new or changing and the patient has concerns that these could be cancer.  The following portions of the chart were reviewed this encounter and updated as appropriate: medications, allergies, medical history  Review of Systems:  No other skin or systemic complaints except as noted in HPI or Assessment and Plan.  Objective  Well appearing patient in no apparent distress; mood and affect are within normal limits.  A full examination was performed including scalp, head, eyes, ears, nose, lips, neck, chest, axillae, abdomen, back, buttocks, bilateral upper extremities, bilateral lower extremities, hands, feet, fingers, toes, fingernails, and toenails. All findings within normal limits unless otherwise noted below.   Relevant physical exam findings are noted in the Assessment and Plan.    Assessment & Plan   LENTIGINES, SEBORRHEIC KERATOSES, HEMANGIOMAS - Benign normal skin lesions - Benign-appearing - Call for any changes  MELANOCYTIC NEVI - Tan-brown and/or pink-flesh-colored symmetric macules and papules - Benign appearing on exam today - Observation - Call clinic for new or changing moles - Recommend daily use of broad  spectrum spf 30+ sunscreen to sun-exposed areas.   ACTINIC DAMAGE - Chronic condition, secondary to cumulative UV/sun exposure - diffuse scaly erythematous macules with underlying dyspigmentation - Recommend daily broad spectrum sunscreen SPF 30+ to sun-exposed areas, reapply every 2 hours as needed.  - Staying in the shade or wearing long sleeves, sun glasses (UVA+UVB protection) and wide brim hats (4-inch brim around the entire circumference of the hat) are also recommended for sun protection.  - Call for new or changing lesions.  SKIN CANCER SCREENING PERFORMED TODAY.    No follow-ups on file.  I, Jetta Ager, am acting as Neurosurgeon for [employer], DO.   Documentation: I have reviewed the above documentation for accuracy and completeness, and I agree with the above.  Delon Lenis, DO

## 2023-10-19 ENCOUNTER — Encounter: Payer: Self-pay | Admitting: Family Medicine

## 2023-10-25 DIAGNOSIS — E1165 Type 2 diabetes mellitus with hyperglycemia: Secondary | ICD-10-CM | POA: Diagnosis not present

## 2023-10-25 DIAGNOSIS — Z789 Other specified health status: Secondary | ICD-10-CM | POA: Diagnosis not present

## 2023-10-25 DIAGNOSIS — R1310 Dysphagia, unspecified: Secondary | ICD-10-CM | POA: Diagnosis not present

## 2023-10-25 DIAGNOSIS — E7800 Pure hypercholesterolemia, unspecified: Secondary | ICD-10-CM | POA: Diagnosis not present

## 2023-10-25 DIAGNOSIS — L680 Hirsutism: Secondary | ICD-10-CM | POA: Diagnosis not present

## 2023-10-25 DIAGNOSIS — M069 Rheumatoid arthritis, unspecified: Secondary | ICD-10-CM | POA: Diagnosis not present

## 2023-10-25 DIAGNOSIS — E049 Nontoxic goiter, unspecified: Secondary | ICD-10-CM | POA: Diagnosis not present

## 2023-10-25 DIAGNOSIS — E559 Vitamin D deficiency, unspecified: Secondary | ICD-10-CM | POA: Diagnosis not present

## 2023-10-25 DIAGNOSIS — R61 Generalized hyperhidrosis: Secondary | ICD-10-CM | POA: Diagnosis not present

## 2023-10-25 DIAGNOSIS — E282 Polycystic ovarian syndrome: Secondary | ICD-10-CM | POA: Diagnosis not present

## 2023-10-26 ENCOUNTER — Other Ambulatory Visit: Payer: Self-pay | Admitting: Allergy

## 2023-10-26 DIAGNOSIS — F330 Major depressive disorder, recurrent, mild: Secondary | ICD-10-CM | POA: Diagnosis not present

## 2023-10-26 DIAGNOSIS — F411 Generalized anxiety disorder: Secondary | ICD-10-CM | POA: Diagnosis not present

## 2023-10-26 DIAGNOSIS — J329 Chronic sinusitis, unspecified: Secondary | ICD-10-CM

## 2023-10-26 NOTE — Progress Notes (Signed)
 Aeroallergen Immunotherapy  Ordering Provider: Dr. Danita Brain  Patient Details Name: Christina Lewis MRN: [id number] Date of Birth: 1955/04/20  Order 1 of 1  Vial Label: pollen, pet  0.3 ml (Volume)  BAU Concentration -- 7 Grass Mix* 100,000 ([place], [place], [place], Perennial Rye, RedTop, Sweet Vernal, Timothy) 0.2 ml (Volume)  1:20 Concentration -- Johnson 0.2 ml (Volume)  1:20 Concentration -- Cocklebur 0.2 ml (Volume)  1:40 Concentration -- Baccharis 0.5 ml (Volume)  1:20 Concentration -- Weed Mix* 0.5 ml (Volume)  1:20 Concentration -- Eastern 10 Tree Mix (also Sweet Gum) 0.2 ml (Volume)  1:10 Concentration -- Cedar, red 0.2 ml (Volume)  1:20 Concentration -- Red Mulberry 0.5 ml (Volume)  1:10 Concentration -- Cat Hair   2.8  ml Extract Subtotal 2.2  ml Diluent  5.0  ml Maintenance Total  Schedule:  RUSH then schedule B Silver Vial (1:1,000,000): Schedule B (6 doses) Blue Vial (1:100,000): Schedule B (6 doses) Yellow Vial (1:10,000): Schedule B (6 doses) Green Vial (1:1,000): Schedule B (6 doses) Red Vial (1:100): Schedule A (14 doses)  Special Instructions: RUSH in [place].  Home: [place].   After completion of the first Red Vial, please space to every two weeks. After completion of the second Red Vial, please space to every 4 weeks. Ok to up dose new vials at 0.33mL --> 0.3 mL --> 0.5 mL.  Ok to come twice weekly, if desired, as long as there is 48 hours between injections.

## 2023-10-26 NOTE — Progress Notes (Signed)
VIALS TO BE MADE CLOSER TO APPT. ?

## 2023-10-31 ENCOUNTER — Other Ambulatory Visit: Payer: Self-pay | Admitting: Rheumatology

## 2023-10-31 ENCOUNTER — Ambulatory Visit: Admitting: Internal Medicine

## 2023-10-31 ENCOUNTER — Telehealth: Payer: Self-pay

## 2023-10-31 DIAGNOSIS — J3081 Allergic rhinitis due to animal (cat) (dog) hair and dander: Secondary | ICD-10-CM | POA: Diagnosis not present

## 2023-10-31 DIAGNOSIS — J301 Allergic rhinitis due to pollen: Secondary | ICD-10-CM | POA: Diagnosis not present

## 2023-10-31 MED ORDER — PREDNISONE 20 MG PO TABS
ORAL_TABLET | ORAL | 0 refills | Status: DC
Start: 2023-10-31 — End: 2023-11-15

## 2023-10-31 MED ORDER — MONTELUKAST SODIUM 10 MG PO TABS: 10.0000 mg | ORAL_TABLET | Freq: Every day | ORAL | 0 refills | Status: DC

## 2023-10-31 MED ORDER — EPINEPHRINE 0.3 MG/0.3ML IJ SOAJ: 0.3000 mg | INTRAMUSCULAR | 1 refills | Status: AC | PRN

## 2023-10-31 NOTE — Telephone Encounter (Signed)
 PT IS SCHEDULED FOR [facility] ON 9/12 IN [facility]. PT WILL BE LOOKING OUT FOR INFO INSTRUCTIONS FOR HER APPT ON FRIDAY.

## 2023-10-31 NOTE — Progress Notes (Signed)
 VIALS MADE ON 10/31/23

## 2023-10-31 NOTE — Addendum Note (Signed)
 Addended by: MARCINE ISAIAH CROME on: 10/31/2023 09:57 AM   Modules accepted: Orders

## 2023-10-31 NOTE — Telephone Encounter (Signed)
 LVM letting the patient know that there was RUSH availability on Friday 11/04/23 in [place] and if interested in that appointment, to please give us  a call back at her earliest convenience.

## 2023-11-01 ENCOUNTER — Telehealth: Payer: Self-pay | Admitting: Adult Health

## 2023-11-01 NOTE — Telephone Encounter (Signed)
 Patient reschedule appointment due to will be in a conference out out [place].

## 2023-11-01 NOTE — Telephone Encounter (Signed)
 Last Fill: 09/30/2023   Next Visit: 12/22/2023   Last Visit: 07/21/2023   Dx: Fibromyalgia    Current Dose per office note on 07/21/2023: not discussed   Okay to refill Tizanidine  and Baclofen ?

## 2023-11-02 ENCOUNTER — Other Ambulatory Visit: Payer: Self-pay | Admitting: Rheumatology

## 2023-11-02 NOTE — Telephone Encounter (Signed)
 Last Fill: 07/12/2023  Labs: 08/15/2023 Glucose is 118. Rest of CMP WNL.   WBC count is elevated-16.6. absolute neutrophils and basophils are elevated  Next Visit: 12/22/2023  Last Visit: 07/21/2023  DX: Rheumatoid arthritis of multiple sites with negative rheumatoid factor   Current Dose per office note 07/21/2023: Rasuvo  20 mg sq injections once weekly,   Okay to refill Methotrexate ?

## 2023-11-03 ENCOUNTER — Telehealth: Payer: Self-pay | Admitting: Allergy & Immunology

## 2023-11-03 NOTE — Telephone Encounter (Signed)
 PT called to reschedule RUSH due to being sick - confirmed that we will reach out in 1-2 weeks to make sure PT is better and check scheduling availability at that time, PT acknowledged

## 2023-11-04 ENCOUNTER — Encounter: Payer: Self-pay | Admitting: Adult Health

## 2023-11-04 ENCOUNTER — Ambulatory Visit: Admitting: Allergy & Immunology

## 2023-11-06 ENCOUNTER — Telehealth: Admitting: Family Medicine

## 2023-11-06 DIAGNOSIS — J019 Acute sinusitis, unspecified: Secondary | ICD-10-CM

## 2023-11-06 DIAGNOSIS — B9689 Other specified bacterial agents as the cause of diseases classified elsewhere: Secondary | ICD-10-CM

## 2023-11-06 MED ORDER — AMOXICILLIN-POT CLAVULANATE 875-125 MG PO TABS: 1.0000 | ORAL_TABLET | Freq: Two times a day (BID) | ORAL | 0 refills | Status: DC

## 2023-11-06 MED ORDER — PREDNISONE 20 MG PO TABS: 20.0000 mg | ORAL_TABLET | Freq: Two times a day (BID) | ORAL | 0 refills | Status: AC

## 2023-11-06 NOTE — Progress Notes (Signed)
 E-Visit for Sinus Problems  We are sorry that you are not feeling well.  Here is how we plan to help!  Based on what you have shared with me it looks like you have sinusitis.  Sinusitis is inflammation and infection in the sinus cavities of the head.  Based on your presentation I believe you most likely have Acute Bacterial Sinusitis.  This is an infection caused by bacteria and is treated with antibiotics. I have prescribed Augmentin 875mg /125mg  one tablet twice daily with food, for 7 days. You may use an oral decongestant such as Mucinex D or if you have glaucoma or high blood pressure use plain Mucinex. Saline nasal spray help and can safely be used as often as needed for congestion.  If you develop worsening sinus pain, fever or notice severe headache and vision changes, or if symptoms are not better after completion of antibiotic, please schedule an appointment with a health care provider.    Sinus infections are not as easily transmitted as other respiratory infection, however we still recommend that you avoid close contact with loved ones, especially the very young and elderly.  Remember to wash your hands thoroughly throughout the day as this is the number one way to prevent the spread of infection!  Home Care: Only take medications as instructed by your medical team. Complete the entire course of an antibiotic. Do not take these medications with alcohol. A steam or ultrasonic humidifier can help congestion.  You can place a towel over your head and breathe in the steam from hot water coming from a faucet. Avoid close contacts especially the very young and the elderly. Cover your mouth when you cough or sneeze. Always remember to wash your hands.  Get Help Right Away If: You develop worsening fever or sinus pain. You develop a severe head ache or visual changes. Your symptoms persist after you have completed your treatment plan.  Make sure you Understand these instructions. Will watch  your condition. Will get help right away if you are not doing well or get worse.  Thank you for choosing an e-visit.  Your e-visit answers were reviewed by a board certified advanced clinical practitioner to complete your personal care plan. Depending upon the condition, your plan could have included both over the counter or prescription medications.  Please review your pharmacy choice. Make sure the pharmacy is open so you can pick up prescription now. If there is a problem, you may contact your provider through [employer] and have the prescription routed to another pharmacy.  Your safety is important to [place]. If you have drug allergies check your prescription carefully.   For the next 24 hours you can use MyChart to ask questions about today's visit, request a non-urgent call back, or ask for a work or school excuse. You will get an email in the next two days asking about your experience. I hope that your e-visit has been valuable and will speed your recovery.    have provided 5 minutes of non face to face time during this encounter for chart review and documentation.

## 2023-11-07 MED ORDER — ONDANSETRON 4 MG PO TBDP: 4.0000 mg | ORAL_TABLET | Freq: Three times a day (TID) | ORAL | 5 refills | Status: AC | PRN

## 2023-11-09 ENCOUNTER — Encounter: Payer: Self-pay | Admitting: Family Medicine

## 2023-11-10 ENCOUNTER — Encounter: Payer: Self-pay | Admitting: Family Medicine

## 2023-11-10 ENCOUNTER — Ambulatory Visit: Payer: Self-pay | Admitting: Family Medicine

## 2023-11-10 ENCOUNTER — Ambulatory Visit (INDEPENDENT_AMBULATORY_CARE_PROVIDER_SITE_OTHER): Admitting: Family Medicine

## 2023-11-10 VITALS — BP 112/68 | HR 62 | Temp 97.3°F | Ht 64.0 in | Wt 164.4 lb

## 2023-11-10 DIAGNOSIS — J329 Chronic sinusitis, unspecified: Secondary | ICD-10-CM | POA: Diagnosis not present

## 2023-11-10 DIAGNOSIS — R079 Chest pain, unspecified: Secondary | ICD-10-CM

## 2023-11-10 MED ORDER — METHYLPREDNISOLONE ACETATE 40 MG/ML IJ SUSP
40.0000 mg | Freq: Once | INTRAMUSCULAR | Status: AC
  Administered 2023-11-10: 40 mg via INTRAMUSCULAR

## 2023-11-10 MED ORDER — DOXYCYCLINE HYCLATE 100 MG PO TABS: 100.0000 mg | ORAL_TABLET | Freq: Two times a day (BID) | ORAL | 0 refills | Status: DC

## 2023-11-10 NOTE — Progress Notes (Signed)
 Subjective:  Patient ID: Christina Lewis, female    DOB: 11-01-1955, 68 y.o.   MRN: [id number]  Patient Care Team: Severa Rock HERO, FNP as PCP - General (Family Medicine) Gladis Gearing, OD (Optometry)   Chief Complaint:  Nasal Congestion, Headache, and Fatigue (X 1 1/2 weeks )   HPI: Christina Lewis is a 68 y.o. female presenting on 11/10/2023 for Nasal Congestion, Headache, and Fatigue (X 1 1/2 weeks )   Christina Lewis is a 68 year old female who presents with chronic congestion and headache.  Her symptoms began last Monday with an inability to talk, followed by body aches and malaise by Tuesday. She experienced a temporary improvement but worsened again. On Sunday, she contacted a virtual doctor who prescribed Augmentin  and prednisone . She felt slightly better on Monday but then deteriorated again, experiencing chronic congestion and a headache unresponsive to her migraine medication.  She has been on Augmentin  for over four days without improvement. She was previously scheduled to start allergy  injections, including a rush immunotherapy session, but had to cancel due to her illness. She has mild allergies and continues to use nasal washes and nasal sprays.  Yesterday, she began experiencing mild chest pain, primarily on the left side, while sitting on the sofa reading. The pain does worsen with deep breaths. No nausea, vomiting, arm pain, sweatiness, or dizziness. She has neck pain but no significant arm pain.          Relevant past medical, surgical, family, and social history reviewed and updated as indicated.  Allergies and medications reviewed and updated. Data reviewed: Chart in Epic.   Past Medical History:  Diagnosis Date   Achilles tendonitis    right foot   Allergy     Anemia    Anxiety    Arthritis    Cataract 2020   Depression    Diabetes mellitus    type 2   Eczema    Fibromyalgia    GERD (gastroesophageal reflux disease)    Gout    no current problem    Hematuria    History of kidney stones    passed stones and also had surgery to remove one   History of tics    Hyperlipidemia 2007   Ineffective esophageal motility    tx with prilosec, essophageal was stretched   Interstitial cystitis 2016   Migraines    Osteoarthritis    PCOS (polycystic ovarian syndrome) 1993   Plantar fasciitis, bilateral    Pneumonia 2019   PONV (postoperative nausea and vomiting)    Recurrent upper respiratory infection (URI)    Sleep apnea    does not use CPAP   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, took medication for a year   Thyroid  nodule    Dr Bella is monitoring every year    Past Surgical History:  Procedure Laterality Date   ABDOMINAL HYSTERECTOMY  2000   APPENDECTOMY  1976   BALLOON DILATION N/A 11/03/2021   Procedure: MERRILL DILATION;  Surgeon: Saintclair Jasper, MD;  Location: [facility];  Service: Gastroenterology;  Laterality: N/A;   BOTOX  INJECTION N/A 11/03/2021   Procedure: BOTOX  INJECTION;  Surgeon: Saintclair Jasper, MD;  Location: [facility];  Service: Gastroenterology;  Laterality: N/A;   BREAST LUMPECTOMY  1989   lt-negative   CARDIOVASCULAR STRESS TEST  2000   CATARACT EXTRACTION W/PHACO Left 12/07/2019   Procedure: CATARACT EXTRACTION PHACO AND INTRAOCULAR LENS PLACEMENT (IOC);  Surgeon: Harrie Agent,  MD;  Location: [facility];  Service: Ophthalmology;  Laterality: Left;  CDE: 5.55   CHOLECYSTECTOMY  1985   COLONOSCOPY     DRUG INDUCED ENDOSCOPY Bilateral 09/29/2022   Procedure: DRUG INDUCED ENDOSCOPY;  Surgeon: Okey Burns, MD;  Location: [facility];  Service: ENT;  Laterality: Bilateral;  15 MINUTES   ESOPHAGEAL MANOMETRY N/A 08/24/2021   Procedure: ESOPHAGEAL MANOMETRY (EM);  Surgeon: Saintclair Jasper, MD;  Location: [facility];  Service: Gastroenterology;  Laterality: N/A;   ESOPHAGOGASTRODUODENOSCOPY N/A 11/03/2021   Procedure: ESOPHAGOGASTRODUODENOSCOPY (EGD);  Surgeon: Saintclair Jasper, MD;   Location: [facility];  Service: Gastroenterology;  Laterality: N/A;   EYE SURGERY  2021   HYSTERECTOMY ABDOMINAL WITH SALPINGECTOMY Bilateral 2000   IMPLANTATION OF HYPOGLOSSAL NERVE STIMULATOR Right 11/24/2022   Procedure: IMPLANTATION OF HYPOGLOSSAL NERVE STIMULATOR;  Surgeon: Okey Burns, MD;  Location: [facility];  Service: ENT;  Laterality: Right;   KIDNEY STONE SURGERY Right 2014   with stent placement in OR   KNEE ARTHROSCOPY Right 08/22/2013   Procedure: ARTHROSCOPY RIGHT KNEE FOR INFECTION LAVAGE AND DRAINAGE;  Surgeon: LELON JONETTA Shari Mickey., MD;  Location: [facility];  Service: Orthopedics;  Laterality: Right;   KNEE ARTHROSCOPY WITH PATELLA RECONSTRUCTION Right 08/06/2013   Procedure: RIGHT KNEE ARTHROSCOPY WITH MENISCECTOMY MEDIAL, ARTHROSCOPY KNEE WITH DEBRIDEMENT/SHAVING (CHONDROPLASTY) ;  Surgeon: LELON JONETTA Shari Mickey., MD;  Location: [facility];  Service: Orthopedics;  Laterality: Right;   LUMBAR LAMINECTOMY/DECOMPRESSION MICRODISCECTOMY Right 09/11/2019   Procedure: Right Lumbar Three-Four Microdiscectomy;  Surgeon: Unice Pac, MD;  [facility];  Service: Neurosurgery;  Laterality: Right;  Right Lumbar Three-Four Microdiscectomy   NASAL SINUS SURGERY     x4   OOPHORECTOMY     SHOULDER SURGERY  2011   right shoulder   SPINE SURGERY  2020   THYROID  SURGERY  1994   TONSILLECTOMY Bilateral 1974    Social History   Socioeconomic History   Marital status: Widowed    Spouse name: Elsie   Number of children: Not on file   Years of education: Not on file   Highest education level: Bachelor's degree (e.g., BA, AB, BS)  Occupational History   Occupation: retired  Tobacco Use   Smoking status: Never    Passive exposure: Past   Smokeless tobacco: Never  Vaping Use   Vaping status: Never Used  Substance and Sexual Activity   Alcohol  use: Never   Drug use: Never   Sexual activity: Not Currently    Birth control/protection: Post-menopausal     Comment: Hysterectomy  Other Topics Concern   Not on file  Social History Narrative   Caffiene 4-5 12 oz cans soda.   Working retired - special ed.   Live with home no kids   Social Drivers of Health   Financial Resource Strain: Low Risk  (09/25/2023)   Overall Financial Resource Strain (CARDIA)    Difficulty of Paying Living Expenses: Not very hard  Food Insecurity: No Food Insecurity (09/25/2023)   Hunger Vital Sign    Worried About Running Out of Food in the Last Year: Never true    Ran Out of Food in the Last Year: Never true  Transportation Needs: No Transportation Needs (09/25/2023)   PRAPARE - Administrator, Civil Service (Medical): No    Lack of Transportation (Non-Medical): No  Physical Activity: Inactive (09/25/2023)   Exercise Vital Sign    Days of Exercise per Week: 0 days    Minutes  of Exercise per Session: Not on file  Stress: No Stress Concern Present (09/25/2023)   [employer] of Occupational Health - Occupational Stress Questionnaire    Feeling of Stress: Only a little  Social Connections: Moderately Isolated (09/25/2023)   Social Connection and Isolation Panel    Frequency of Communication with Friends and Family: More than three times a week    Frequency of Social Gatherings with Friends and Family: More than three times a week    Attends Religious Services: 1 to 4 times per year    Active Member of [employer] or Organizations: No    Attends Banker Meetings: Not on file    Marital Status: Widowed  Intimate Partner Violence: Not At Risk (02/18/2023)   Humiliation, Afraid, Rape, and Kick questionnaire    Fear of Current or Ex-Partner: No    Emotionally Abused: No    Physically Abused: No    Sexually Abused: No    Outpatient Encounter Medications as of 11/10/2023  Medication Sig   amoxicillin -clavulanate (AUGMENTIN ) 875-125 MG tablet Take 1 tablet by mouth 2 (two) times daily.   baclofen  (LIORESAL ) 10 MG tablet TAKE 1 TABLET BY MOUTH IN THE  MORNING AND 1 AT NOON   budesonide  (PULMICORT ) 0.5 MG/2ML nebulizer solution Take 2 mLs (0.5 mg total) by nebulization in the morning and at bedtime. MIX 1 VIAL WITH 250CC OF NORMAL SALINE FOR SINUS WASH DAILY AS NEEDED   Carbinoxamine  Maleate 4 MG TABS Take 2 tablets (8 mg total) by mouth 2 (two) times daily as needed (allergies).   cholecalciferol (VITAMIN D ) 25 MCG (1000 UNIT) tablet Take 1,000 Units by mouth daily.   doxycycline  (VIBRA -TABS) 100 MG tablet Take 1 tablet (100 mg total) by mouth 2 (two) times daily for 10 days. 1 po bid   EPINEPHrine  0.3 mg/0.3 mL IJ SOAJ injection Inject 0.3 mg into the muscle as needed for anaphylaxis.   Eptinezumab -jjmr (VYEPTI  IV) Inject 300 mg into the vein every 3 (three) months.   folic acid  (FOLVITE ) 1 MG tablet TAKE TWO TABLETS BY MOUTH EVERY DAY   hydroxychloroquine  (PLAQUENIL ) 200 MG tablet TAKE 1 TABLET TWICE DAILY MONDAY THRU FRIDAY FOR RHEUMATOID ARTHRTITIS.   Lancets (ONETOUCH DELICA PLUS LANCET33G) MISC SMARTSIG:1 Topical Daily   MAGNESIUM  PO Take 500 mg by mouth at bedtime.   methotrexate  50 MG/2ML injection INJECT 0.8 MLS (20 MG TOTAL) INTO THE SKIN ONCE A WEEK   montelukast  (SINGULAIR ) 10 MG tablet Take 1 tablet (10 mg total) by mouth at bedtime.   Multiple Vitamin (MULTIVITAMIN WITH MINERALS) TABS tablet Take 1 tablet by mouth daily.   omeprazole (PRILOSEC) 20 MG capsule Take 40 mg by mouth daily.   ondansetron  (ZOFRAN -ODT) 4 MG disintegrating tablet Take 1 tablet (4 mg total) by mouth every 8 (eight) hours as needed for nausea.   ONETOUCH VERIO test strip 1 each daily.   Polyvinyl Alcohol -Povidone (REFRESH OP) Apply to eye as needed.   predniSONE  (DELTASONE ) 20 MG tablet Take 40 mg (2 tablets) the morning before and 40 mg (2 tablets) the morning of [facility].   predniSONE  (DELTASONE ) 20 MG tablet Take 1 tablet (20 mg total) by mouth 2 (two) times daily with a meal for 5 days.   Rimegepant Sulfate (NURTEC) 75 MG TBDP Take 1 tablet  (75 mg total) by mouth daily as needed. NDC [address]-8, lot [id number] exp 09/2025   tacrolimus  (PROTOPIC ) 0.1 % ointment Apply topically 2 (two) times daily.   tiZANidine  (ZANAFLEX ) 4 MG  tablet TAKE 1 TABLET BY MOUTH AT BEDTIME AS NEEDED   TUBERCULIN SYR 1CC/27GX1/2 (B-D TB SYRINGE 1CC/27GX1/2) 27G X 1/2 1 ML MISC 12 Syringes by Does not apply route once a week.   [DISCONTINUED] gabapentin (NEURONTIN) 300 MG capsule Take 300 mg by mouth 3 (three) times daily.   [EXPIRED] methylPREDNISolone  acetate (DEPO-MEDROL ) injection 40 mg    No facility-administered encounter medications on file as of 11/10/2023.    Allergies  Allergen Reactions   Sulfa Antibiotics Anaphylaxis and Other (See Comments)   Sulfamethoxazole-Trimethoprim Anaphylaxis   Atorvastatin Other (See Comments)    Severe Muscle and Joint pain with ALL statins    Codeine Itching   Erythromycin Nausea And Vomiting   Metronidazole  Diarrhea and Other (See Comments)   Morphine And Codeine Itching   Semaglutide Nausea And Vomiting    Ozempic*    Pertinent ROS per HPI, otherwise unremarkable      Objective:  BP 112/68   Pulse 62   Temp (!) 97.3 F (36.3 C)   Ht 5' 4 (1.626 m)   Wt 164 lb 6.4 oz (74.6 kg)   SpO2 95%   BMI 28.22 kg/m    Wt Readings from Last 3 Encounters:  11/10/23 164 lb 6.4 oz (74.6 kg)  10/13/23 166 lb 12.8 oz (75.7 kg)  10/12/23 165 lb 6.4 oz (75 kg)    Physical Exam Vitals and nursing note reviewed.  Constitutional:      Appearance: Normal appearance. She is well-developed.  HENT:     Head: Normocephalic and atraumatic.     Right Ear: A middle ear effusion is present.     Left Ear: A middle ear effusion is present.     Nose: Congestion present.     Right Sinus: Maxillary sinus tenderness and frontal sinus tenderness present.     Left Sinus: Maxillary sinus tenderness and frontal sinus tenderness present.     Mouth/Throat:     Pharynx: Posterior oropharyngeal erythema present.  Eyes:      General: No visual field deficit.    Pupils: Pupils are equal, round, and reactive to light.  Cardiovascular:     Rate and Rhythm: Normal rate and regular rhythm.     Heart sounds: Normal heart sounds.  Pulmonary:     Effort: Pulmonary effort is normal.     Breath sounds: Normal breath sounds.  Musculoskeletal:     Cervical back: Neck supple.     Right lower leg: No edema.     Left lower leg: No edema.  Skin:    General: Skin is warm and dry.     Capillary Refill: Capillary refill takes less than 2 seconds.  Neurological:     Mental Status: She is alert and oriented to person, place, and time.     Cranial Nerves: No cranial nerve deficit, dysarthria or facial asymmetry.  Psychiatric:        Mood and Affect: Mood normal.        Speech: Speech normal.        Behavior: Behavior normal.        Thought Content: Thought content normal.        Judgment: Judgment normal.    EKG: SR 63, PR 154 ms, QT 402 ms, no acute ST-T changes or ectopy, incomplete BBB. No significant changes from 2022 EKG. Rosaline Bruns, FNP-C  Results for orders placed or performed in visit on 08/15/23  Comprehensive metabolic panel with GFR   Collection Time: 08/15/23 11:06 AM  Result Value Ref Range   Glucose, Bld 118 (H) 65 - 99 mg/dL   BUN 21 7 - 25 mg/dL   Creat 9.27 9.49 - 8.94 mg/dL   eGFR 92 > OR = 60 fO/fpw/8.26f7   BUN/Creatinine Ratio SEE NOTE: 6 - 22 (calc)   Sodium 139 135 - 146 mmol/L   Potassium 4.6 3.5 - 5.3 mmol/L   Chloride 103 98 - 110 mmol/L   CO2 28 20 - 32 mmol/L   Calcium 9.9 8.6 - 10.4 mg/dL   Total Protein 6.6 6.1 - 8.1 g/dL   Albumin 4.6 3.6 - 5.1 g/dL   Globulin 2.0 1.9 - 3.7 g/dL (calc)   AG Ratio 2.3 1.0 - 2.5 (calc)   Total Bilirubin 0.2 0.2 - 1.2 mg/dL   Alkaline phosphatase (APISO) 65 37 - 153 U/L   AST 16 10 - 35 U/L   ALT 18 6 - 29 U/L  CBC with Differential/Platelet   Collection Time: 08/15/23 11:06 AM  Result Value Ref Range   WBC 16.6 (H) 3.8 - 10.8 Thousand/uL    RBC 4.41 3.80 - 5.10 Million/uL   Hemoglobin 13.9 11.7 - 15.5 g/dL   HCT 58.1 64.9 - 54.9 %   MCV 94.8 80.0 - 100.0 fL   MCH 31.5 27.0 - 33.0 pg   MCHC 33.3 32.0 - 36.0 g/dL   RDW 86.9 88.9 - 84.9 %   Platelets 399 140 - 400 Thousand/uL   MPV 10.1 7.5 - 12.5 fL   Neutro Abs 12,234 (H) 1,500 - 7,800 cells/uL   Absolute Lymphocytes 3,104 850 - 3,900 cells/uL   Absolute Monocytes 913 200 - 950 cells/uL   Eosinophils Absolute 133 15 - 500 cells/uL   Basophils Absolute 216 (H) 0 - 200 cells/uL   Neutrophils Relative % 73.7 %   Total Lymphocyte 18.7 %   Monocytes Relative 5.5 %   Eosinophils Relative 0.8 %   Basophils Relative 1.3 %   Smear Review         Pertinent labs & imaging results that were available during my care of the patient were reviewed by me and considered in my medical decision making.  Assessment & Plan:  Liahna was seen today for nasal congestion, headache and fatigue.  Diagnoses and all orders for this visit:  Recurrent sinusitis -     doxycycline  (VIBRA -TABS) 100 MG tablet; Take 1 tablet (100 mg total) by mouth 2 (two) times daily for 10 days. 1 po bid -     methylPREDNISolone  acetate (DEPO-MEDROL ) injection 40 mg  Chest pain in adult -     EKG 12-Lead -     Ambulatory referral to Cardiology       Chronic sinusitis with recurrent congestion and headache Chronic sinusitis with recurrent congestion and headache, not responding to Augmentin  and prednisone . Symptoms include headache, congestion, and general malaise. Possible resistance to Augmentin  considered. - Discontinue Augmentin  - Prescribe doxycycline  for 10 days, twice daily - Administer steroid shot for congestion relief - Continue nasal washes and sprays  Chest pain, likely musculoskeletal Intermittent chest pain, primarily on the left side, occurring at rest. Pain exacerbated by deep breathing, suggesting a musculoskeletal origin, possibly costochondritis. No associated symptoms such as nausea,  vomiting, or radiating pain. EKG performed to rule out cardiac causes, showing no acute changes but an incomplete bundle branch block. - Advise use of heating pads for pain relief - Administer steroid shot for potential musculoskeletal pain relief - Instruct to seek immediate care if  symptoms worsen or new symptoms develop  Incomplete bundle branch block EKG shows an incomplete bundle branch block, consistent with previous EKG from 2022. No acute changes noted. - Refer to cardiology for further evaluation - Instruct to monitor for symptoms such as unexplained fatigue, shortness of breath, dizziness, or nausea and seek immediate care if she occurs          Continue all other maintenance medications.  Follow up plan: Return if symptoms worsen or fail to improve.   Continue healthy lifestyle choices, including diet (rich in fruits, vegetables, and lean proteins, and low in salt and simple carbohydrates) and exercise (at least 30 minutes of moderate physical activity daily).  Educational handout given for sinus infection   The above assessment and management plan was discussed with the patient. The patient verbalized understanding of and has agreed to the management plan. Patient is aware to call the clinic if they develop any new symptoms or if symptoms persist or worsen. Patient is aware when to return to the clinic for a follow-up visit. Patient educated on when it is appropriate to go to the emergency department.   Rosaline Bruns, FNP-C [facility] [phone]

## 2023-11-11 ENCOUNTER — Encounter

## 2023-11-11 ENCOUNTER — Ambulatory Visit: Admitting: Acute Care

## 2023-11-14 ENCOUNTER — Ambulatory Visit (INDEPENDENT_AMBULATORY_CARE_PROVIDER_SITE_OTHER): Admitting: Otolaryngology

## 2023-11-14 ENCOUNTER — Encounter (INDEPENDENT_AMBULATORY_CARE_PROVIDER_SITE_OTHER): Payer: Self-pay | Admitting: Otolaryngology

## 2023-11-14 VITALS — BP 120/74 | HR 71 | Temp 98.2°F

## 2023-11-14 DIAGNOSIS — Z7722 Contact with and (suspected) exposure to environmental tobacco smoke (acute) (chronic): Secondary | ICD-10-CM

## 2023-11-14 DIAGNOSIS — R0982 Postnasal drip: Secondary | ICD-10-CM | POA: Diagnosis not present

## 2023-11-14 DIAGNOSIS — J029 Acute pharyngitis, unspecified: Secondary | ICD-10-CM

## 2023-11-14 DIAGNOSIS — R0981 Nasal congestion: Secondary | ICD-10-CM

## 2023-11-14 DIAGNOSIS — J3089 Other allergic rhinitis: Secondary | ICD-10-CM

## 2023-11-14 MED ORDER — IPRATROPIUM BROMIDE 0.03 % NA SOLN: 2.0000 | Freq: Two times a day (BID) | NASAL | 12 refills | Status: DC

## 2023-11-14 NOTE — Progress Notes (Signed)
 ENT Progress Note:   Update 11/14/2023  Discussed the use of AI scribe software for clinical note transcription with the patient, who gave verbal consent to proceed.  History of Present Illness  Discussed the use of AI scribe software for clinical note transcription with the patient, who gave verbal consent to proceed.  History of Present Illness Christina Lewis is a 68 year old female who presents with chronic nasal congestion and drainage.  She has been experiencing chronic nasal congestion and drainage for two weeks. Despite using saline sprays, antihistamines, decongestants, Mucinex, and a steroid nasal wash, her symptoms persist, causing significant discomfort and difficulty sleeping. She was advised by her allergist not to start allergy  shots while she is sick.  She is currently on her second antibiotic, doxycycline , which she started on Thursday after initially being prescribed Augmentin . She is also taking Carbinomaxine Maleate 4 mg given by her allergist.  Her symptoms include a sore throat, with the primary concern being chronic nasal congestion. She describes an inability to clear her nasal passages despite using various treatments. No fever is present, and her sore throat is intermittent. She performs nasal washes both in the morning and at night and has not been exposed to anyone who is sick.  Records Reviewed:   Update last OV Discussed the use of AI scribe software for clinical note transcription with the patient, who gave verbal consent to proceed.  History of Present Illness Christina Lewis is a 68 year old female with chronic nasal congestion and environmental allergies who presents for fu.  She experiences persistent allergy  symptoms characterized by a constant pattern of sniffing, which have not improved with nasal washes and antihistamines. On some days, she requires additional Benadryl  to manage her symptoms.  She has a long-standing history of allergic rhinitis and  has tried treatment with antihistamines and nasal washes. She is currently under the care of an allergist who has prescribed a new antihistamine and recommended continuing nasal washes.  She is using Inspire therapy for obstructive sleep apnea and reports satisfaction with the treatment.  Records Reviewed:  Initial Evaluation   Update 06/01/2023  Discussed the use of AI scribe software for clinical note transcription with the patient, who gave verbal consent to proceed.  History of Present Illness Christina Lewis is a 67 year old female who presents with ear discomfort and chronic vs recurrent sore throat.  She experiences a sensation of both ears being 'clogged up,' with pain in the left ear and a 'swishy' sound in the right ear, described as 'like the ocean.' These symptoms began on Saturday night or Sunday. She has had similar ear issues for about six years, with previous ENT consultations suggesting ear tubes. She uses nasal rinses, decongestants, and antihistamines (Sudafed during the day and an unspecified antihistamine at night) to manage symptoms. She stopped taking Advil  due to concerns about ear ringing. Her hearing was tested recently and was normal. No current infection in her ears is reported.  She reports a sore throat and mouth sores in her mouth, including ulcers in the back of her throat. She has a history of nonallergic rhinitis diagnosed four to five years ago and has undergone allergy  testing via skin prick, which was negative. She received eye drops for an ulcer-like lesion under her eyelid but no ear drops.  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 She denies having  reflux in the traditional sense but acknowledges esophageal spasms.  In her teens, she was allergic to many things and underwent allergy  shots for two and a half years. She is open to retesting for  allergies. She reports a history of sinus surgery and has been using steroid rinses every day.  Records Reviewed  Visit in ED 05/28/23 Patient presenting today with intermittent episodes for the past month of gritty sensation to the right eye, redness and injection. States she has been using over-the-counter drops for this issue that were recommended by her eye specialist and that the symptoms always go away but often come back typically noting it first thing in the morning. She denies visual change, headache, nausea, vomiting, fevers, known injury to the eye   History of Present Illness Christina Lewis is a 69 year old female who presents with chronic nasal congestion and drainage.  She has been experiencing chronic nasal congestion and drainage for two weeks. Despite using saline sprays, antihistamines, decongestants, Mucinex, and a steroid nasal wash, her symptoms persist, causing significant discomfort and difficulty sleeping. She was advised by her allergist not to start allergy  shots while she is sick.  She is currently on her second antibiotic, doxycycline , which she started on Thursday after initially being prescribed Armet. She is also taking Xyzal , 8 mg twice a day, as prescribed by her allergist.  Her symptoms include a sore throat and left ear pain, with the primary concern being chronic nasal congestion. She describes an inability to clear her nasal passages despite using various treatments. No fever is present, and her sore throat is intermittent. She performs nasal washes both in the morning and at night and has not been exposed to anyone who is sick.  Prior visit records reviewed: Update 02/04/23  History of Present Illness   The patient, with a history of prior sinus infections and sinus surgery, presents with recurrent symptoms despite treatment with antibiotics and steroids given by PCP. They describe a pattern of improvement following treatment, only to relapse within a week or  two. Symptoms include ear pressure, a sensation of struggling to breathe though her nose, and constant need to clear the throat, blow her nose. These symptoms have been disruptive to their sleep.   A CT sinuses was performed 10/2022, with patent paranasal sinuses and evidence of prior FESS. She developed cold/URI sx right after CT.    The patient has been using Flonase  for their nasal symptoms, having switched from another nasal spray due to excessive dryness. They have also been taking Xyzal , Sudafed, and Mucinex, and using saline rinses to alleviate their symptoms.   The patient also reports a sensation of fluid in their right ear. She is doing well with Inspire implant, and currently using it during sleep.   Records reviewed PCP office visit 02/01/23 The patient, with a history of sinus issues, presents with recurrent symptoms despite previous treatment with antibiotics and steroids. They describe a pattern of improvement following treatment, only to relapse within a week or two. Symptoms include blocked ears, a sensation of struggling to breathe, and constant need to clear the throat, blow the nose, and snore. These symptoms have been disruptive to their sleep.   The patient had seen an ENT specialist a few weeks prior, who found no significant issues at the time. However, the patient's symptoms have since returned. A CT scan was performed, but the patient fell ill after the scan. The patient also mentions difficulty controlling their blood sugar levels  and has an upcoming appointment with their endocrinologist.   The patient has been using Flonase  for their nasal symptoms, having switched from another nasal spray due to excessive dryness. They have also been taking Xyzal , Sudafed, and Mucinex, and using saline solution to alleviate their symptoms.   The patient also reports a sensation of fluid in their right ear and a swollen lymph node. They have a history of elevated liver enzymes and white  blood cell count. The patient is scheduled to see the ENT specialist again soon  Update 01/14/23:   Discussed the use of AI scribe software for clinical note transcription with the patient, who gave verbal consent to proceed.  History of Present Illness   The patient, with a recent history of Inspire implant surgery, presents for follow-up. The patient reports mild discomfort at the site of submental incision at night. Despite these symptoms, the patient expresses satisfaction with the overall surgical outcome and how incisions appear currently.   In addition to post-surgical discomfort, the patient has been dealing with a persistent sinus issues/nasal congestion. They report a recent severe sinus infection, which required multiple changes in antibiotic therapy, including two courses of Augmentin . The infection has since improved, but the patient continues to experience significant post-nasal drainage, particularly at night, to the point of choking. Prior to the infection, the patient had been experiencing chronic nasal congestion and a constant need to clear the nose, which had temporarily resolved with changes in medication but has since returned.  The patient is currently on a regimen of allergy  medications, including nasal sprays. The patient has not been using saline rinses but has been using saline sprays.     Initial evaluation 09/22/22 Reason for Consult: OSA, nasal congestion and facial pain/pressure/post-nasal drainage/headaches/tinnitus    HPI: KIYRA SLAUBAUGH is an 67 y.o. female with hx RA affecting small joints, f.b Rheum on Methotrexate  and Plaquenil , hx of OSA, previously on CPAP for many years, however, discontinued CPAP use 2/2 being unable to tolerate the mask, here for multiple complaints.   1)  She is here for nasal congestion, nasal obstruction, and frontal headaches for years, feels nasal drainage and post-nasal drainage. She has decreased sense of smell, and she thinks it  happened after covid 2021. Had allergy  testing 4 years ago and it was negative. She had allergy  shots for 4 yrs when she was a child. On Flonase  and Zyrtec. She has hx of migraines, and feels they are under control after starting an infusion medication (new drug for migraines). She has frontal headache on the left when it acts up and with her sinus sx. 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 No new sx   3) hx of OSA and had three CPAP machines (used one for 1.5 yrs) and diagnosed in 2002. She stopped using it due to intoleence and was recently re-established with sleep medicine. Recent sleep study done with [facility] neurology and she is interested in [place] implant.   3) Bilateral tinnitus, water in her ear, worse on the left, and she had ear infections in her ears and chronic sinus all her life, she  4) She reports hx of Esophageal dysmotility/GERD - on Prilosec.       Past Medical History:  Diagnosis Date   Achilles tendonitis  right foot   Allergy     Anemia    Anxiety    Arthritis    Cataract 2020   Depression    Diabetes mellitus    type 2   Eczema    Fibromyalgia    GERD (gastroesophageal reflux disease)    Gout    no current problem   Hematuria    History of kidney stones    passed stones and also had surgery to remove one   History of tics    Hyperlipidemia 2007   Ineffective esophageal motility    tx with prilosec, essophageal was stretched   Interstitial cystitis 2016   Migraines    Osteoarthritis    PCOS (polycystic ovarian syndrome) 1993   Plantar fasciitis, bilateral    Pneumonia 2019   PONV (postoperative nausea and vomiting)    Recurrent upper respiratory infection (URI)    Sleep apnea    does not use CPAP   TB (pulmonary tuberculosis)    tested  positive in 1963, took medication for a year   Thyroid  nodule    Dr Bella is monitoring every year    Past Surgical History:  Procedure Laterality Date   ABDOMINAL HYSTERECTOMY  2000   APPENDECTOMY  1976   BALLOON DILATION N/A 11/03/2021   Procedure: MERRILL DILATION;  Surgeon: Saintclair Jasper, MD;  Location: [facility];  Service: Gastroenterology;  Laterality: N/A;   BOTOX  INJECTION N/A 11/03/2021   Procedure: BOTOX  INJECTION;  Surgeon: Saintclair Jasper, MD;  Location: [facility];  Service: Gastroenterology;  Laterality: N/A;   BREAST LUMPECTOMY  1989   lt-negative   CARDIOVASCULAR STRESS TEST  2000   CATARACT EXTRACTION W/PHACO Left 12/07/2019   Procedure: CATARACT EXTRACTION PHACO AND INTRAOCULAR LENS PLACEMENT (IOC);  Surgeon: Harrie Agent, MD;  Location: [facility];  Service: Ophthalmology;  Laterality: Left;  CDE: 5.55   CHOLECYSTECTOMY  1985   COLONOSCOPY     DRUG INDUCED ENDOSCOPY Bilateral 09/29/2022   Procedure: DRUG INDUCED ENDOSCOPY;  Surgeon: Okey Burns, MD;  Location: [facility];  Service: ENT;  Laterality: Bilateral;  15 MINUTES   ESOPHAGEAL MANOMETRY N/A 08/24/2021   Procedure: ESOPHAGEAL MANOMETRY (EM);  Surgeon: Saintclair Jasper, MD;  Location: [facility];  Service: Gastroenterology;  Laterality: N/A;   ESOPHAGOGASTRODUODENOSCOPY N/A 11/03/2021   Procedure: ESOPHAGOGASTRODUODENOSCOPY (EGD);  Surgeon: Saintclair Jasper, MD;  Location: [facility];  Service: Gastroenterology;  Laterality: N/A;   EYE SURGERY  2021   HYSTERECTOMY ABDOMINAL WITH SALPINGECTOMY Bilateral 2000   IMPLANTATION OF HYPOGLOSSAL NERVE STIMULATOR Right 11/24/2022   Procedure: IMPLANTATION OF HYPOGLOSSAL NERVE STIMULATOR;  Surgeon: Okey Burns, MD;  Location: [facility];  Service: ENT;  Laterality: Right;   KIDNEY STONE SURGERY Right 2014   with stent placement in OR   KNEE ARTHROSCOPY Right 08/22/2013   Procedure: ARTHROSCOPY RIGHT KNEE FOR INFECTION LAVAGE AND DRAINAGE;  Surgeon: LELON JONETTA Shari Mickey., MD;  Location: [facility];  Service: Orthopedics;  Laterality: Right;   KNEE ARTHROSCOPY WITH PATELLA RECONSTRUCTION Right 08/06/2013   Procedure: RIGHT KNEE ARTHROSCOPY WITH MENISCECTOMY MEDIAL, ARTHROSCOPY KNEE WITH DEBRIDEMENT/SHAVING (CHONDROPLASTY) ;  Surgeon: LELON JONETTA Shari Mickey., MD;  Location: [facility];  Service: Orthopedics;  Laterality: Right;   LUMBAR LAMINECTOMY/DECOMPRESSION MICRODISCECTOMY Right 09/11/2019   Procedure: Right Lumbar Three-Four Microdiscectomy;  Surgeon: Unice Pac, MD;  Location: [facility];  Service: Neurosurgery;  Laterality: Right;  Right Lumbar Three-Four Microdiscectomy   NASAL SINUS SURGERY     x4   OOPHORECTOMY  SHOULDER SURGERY  2011   right shoulder   SPINE SURGERY  2020   THYROID  SURGERY  1994   TONSILLECTOMY Bilateral 1974    Family History  Problem Relation Age of Onset   Fibromyalgia Mother    Psoriasis Mother        psoriatic arthritis    Diabetes Mother    Heart disease Mother    Arthritis Mother    Asthma Mother    Hyperlipidemia Mother    Hypertension Mother    Stroke Mother    Rheum arthritis Sister    Arthritis Sister    Asthma Sister    Diabetes Sister    Hyperlipidemia Sister    Miscarriages / Stillbirths Sister    Psoriasis Sister        psoriatic arthritis   Arthritis Sister    Asthma Sister    Diabetes Sister    Hyperlipidemia Sister    Hypertension Sister    Miscarriages / [place] Sister     Social History:  reports that she has never smoked. She has been exposed to tobacco smoke. She has never used smokeless tobacco. She reports that she does not drink alcohol  and does not use drugs.  Allergies:  Allergies  Allergen Reactions   Sulfa Antibiotics Anaphylaxis and Other (See Comments)   Sulfamethoxazole-Trimethoprim Anaphylaxis   Atorvastatin Other (See Comments)    Severe Muscle and Joint pain with ALL statins    Codeine Itching   Erythromycin Nausea And Vomiting    Metronidazole  Diarrhea and Other (See Comments)   Morphine And Codeine Itching   Semaglutide Nausea And Vomiting    Ozempic*    Medications: I have reviewed the patient's current medications.  The PMH, PSH, Medications, Allergies, and SH were reviewed and updated.  ROS: Constitutional: Negative for fever, weight loss and weight gain. Cardiovascular: Negative for chest pain and dyspnea on exertion. Respiratory: Is not experiencing shortness of breath at rest. Gastrointestinal: Negative for nausea and vomiting. Neurological: Negative for headaches. Psychiatric: The patient is not nervous/anxious  Blood pressure 120/74, pulse 71, temperature 98.2 F (36.8 C), SpO2 93%.  PHYSICAL EXAM:  Exam: General: Well-developed, well-nourished Respiratory Respiratory effort: Equal inspiration and expiration without stridor Cardiovascular Peripheral Vascular: Warm extremities with equal color/perfusion Eyes: No nystagmus with equal extraocular motion bilaterally Neuro/Psych/Balance: Patient oriented to person, place, and time; Appropriate mood and affect; Gait is intact with no imbalance; Cranial nerves I-XII are intact Head and Face Inspection: Normocephalic and atraumatic without mass or lesion Palpation: Facial skeleton intact without bony stepoffs Salivary Glands: No mass or tenderness Facial Strength: Facial motility symmetric and full bilaterally ENT Pinna: External ear intact and fully developed External canal: Canal is patent with intact skin Tympanic Membrane: Clear and mobile External Nose: No scar or anatomic deformity Bilateral inferior turbinate hypertrophy.  Lips, Teeth, and gums: Mucosa and teeth intact and viable TMJ: No pain to palpation with full mobility Oral cavity/oropharynx: No erythema or exudate, no lesions present Internal Nose: no purulence or pus, nasal passages are patent, no polyps, no significant nasal drainage noted Neck Neck and Trachea: Midline trachea  without mass or lesion Thyroid : No mass or nodularity Lymphatics: No lymphadenopathy  PROCEDURE NOTE: nasal endoscopy  Preoperative diagnosis: chronic sinusitis symptoms  Postoperative diagnosis: same  Procedure: Diagnostic nasal endoscopy (68768)  Surgeon: Elena Larry, M.D.  Anesthesia: Topical lidocaine  and Afrin  H&P REVIEW: The patient's history and physical were reviewed today prior to procedure. All medications were reviewed and updated as well. Complications:  None Condition is stable throughout exam Indications and consent: The patient presents with symptoms of chronic sinusitis not responding to previous therapies. All the risks, benefits, and potential complications were reviewed with the patient preoperatively and informed consent was obtained. The time out was completed with confirmation of the correct procedure.   Procedure: The patient was seated upright in the clinic. Topical lidocaine  and Afrin were applied to the nasal cavity. After adequate anesthesia had occurred, the rigid nasal endoscope was passed into the nasal cavity. The nasal mucosa, turbinates, septum, and sinus drainage pathways were visualized bilaterally. This revealed no purulence or significant secretions that might be cultured. There were no polyps or sites of significant inflammation. The mucosa was intact and there was no crusting present. The scope was then slowly withdrawn and the patient tolerated the procedure well. There were no complications or blood loss.    Studies Reviewed: CT sinuses 11/18/22  FINDINGS: Paranasal sinuses:   Frontal: Normally aerated. Patent frontal sinus drainage pathways.   Ethmoid: Postsurgical changes from prior bilateral inferior ethmoidectomies. Normally aerated.   Maxillary: Postsurgical changes from bilateral maxillary antrostomies. There is trace mucosal thickening of the floor of the left maxillary sinus.   Sphenoid: Normally aerated. Patent sphenoethmoidal  recesses.   Right ostiomeatal unit: Patent.   Left ostiomeatal unit: Patent.   Nasal passages: Patent. Intact nasal septum is midline.   Anatomy: There is pneumatization superior to both anterior ethmoid notches. Symmetric and intact olfactory grooves and fovea ethmoidalis, Keros II (4-97mm). Sellar sphenoid pneumatization pattern. No dehiscence of carotid or optic canals. No onodi cell.   Other: Orbits and intracranial compartment are unremarkable. Left lens replacement. Visible mastoid air cells are normally aerated.   IMPRESSION: Postsurgical changes from prior FESS with trace mucosal thickening of the floor of the left maxillary sinus. Otherwise, the paranasal sinuses are normally aerated with patent sinus drainage pathways.    Allergy  test 07/25/23  Assessment/Plan: Encounter Diagnoses  Name Primary?   Chronic nasal congestion Yes   Post-nasal drip    Environmental and seasonal allergies          Assessment & Plan Chronic nasal congestion and Environmental allergies Chronic allergic rhinitis with persistent symptoms despite antihistamine and nasal washes with steroid and saline. Allergy  testing identified specific allergens. Allergy  injections considered if no improvement. - Continue antihistamine and nasal washes with steroids as prescribed. Steroid rinses refill sent - she was offered allergy  shots and will see Allergy  in a few days to discuss initiation of immunotherapy   Obstructive sleep apnea status post Inspire device Obstructive sleep apnea managed with Inspire device. She reports satisfaction and no incision site issues. - continue Inspire therapy  Update 11/14/23 Chronic nasal drainage and post-nasal drainage Chronic nasal drainage with sensation of blockage. Examination suggests non-infectious etiology. Nasal drainage may not be allergy -related. - Prescribed ipratropium bromide  nasal spray, use up to three or four times daily. - Continue steroid  rinses - Consider starting allergy  shots as no contraindication exists.   Sore throat Intermittent sore throat with no significant redness or fever, suggesting non-infectious etiology. - likely related to post-nasal drainage or silent reflux - continue Omeprazole at current dose - diet and lifestyle changes    Elena Larry, MD Otolaryngology [facility] Phone: [phone] Fax: [phone]    11/14/2023, 3:46 PM

## 2023-11-15 ENCOUNTER — Ambulatory Visit: Payer: HMO | Admitting: Dermatology

## 2023-11-15 ENCOUNTER — Ambulatory Visit: Attending: Internal Medicine | Admitting: Internal Medicine

## 2023-11-15 ENCOUNTER — Encounter: Payer: Self-pay | Admitting: Internal Medicine

## 2023-11-15 VITALS — BP 118/72 | HR 76 | Ht 64.0 in | Wt 163.4 lb

## 2023-11-15 DIAGNOSIS — E1169 Type 2 diabetes mellitus with other specified complication: Secondary | ICD-10-CM | POA: Diagnosis not present

## 2023-11-15 DIAGNOSIS — R0609 Other forms of dyspnea: Secondary | ICD-10-CM | POA: Insufficient documentation

## 2023-11-15 DIAGNOSIS — R0789 Other chest pain: Secondary | ICD-10-CM | POA: Diagnosis not present

## 2023-11-15 DIAGNOSIS — E785 Hyperlipidemia, unspecified: Secondary | ICD-10-CM

## 2023-11-15 DIAGNOSIS — E781 Pure hyperglyceridemia: Secondary | ICD-10-CM | POA: Insufficient documentation

## 2023-11-15 MED ORDER — REPATHA SURECLICK 140 MG/ML ~~LOC~~ SOAJ: 140.0000 mg | SUBCUTANEOUS | 2 refills | Status: DC

## 2023-11-15 NOTE — Patient Instructions (Signed)
 Medication Instructions:  Your physician has recommended you make the following change in your medication:  Start Repatha  140 mg every 2 weeks  Continue taking all other medications as prescribed  Labwork: Fasting Lipid Panel in 3 months to be completed at [facility]  Testing/Procedures: Your physician has requested that you have an echocardiogram. Echocardiography is a painless test that uses sound waves to create images of your heart. It provides your doctor with information about the size and shape of your heart and how well your heart's chambers and valves are working. This procedure takes approximately one hour. There are no restrictions for this procedure. Please do NOT wear cologne, perfume, aftershave, or lotions (deodorant is allowed). Please arrive 15 minutes prior to your appointment time.  Please note: We ask at that you not bring children with you during ultrasound (echo/ vascular) testing. Due to room size and safety concerns, children are not allowed in the ultrasound rooms during exams. Our front office staff cannot provide observation of children in our lobby area while testing is being conducted. An adult accompanying a patient to their appointment will only be allowed in the ultrasound room at the discretion of the ultrasound technician under special circumstances. We apologize for any inconvenience.   Follow-Up: Your physician recommends that you schedule a follow-up appointment in: 3 months  Any Other Special Instructions Will Be Listed Below (If Applicable). Thank you for choosing [facility]!     If you need a refill on your cardiac medications before your next appointment, please call your pharmacy.

## 2023-11-15 NOTE — Progress Notes (Signed)
 Cardiology Office Note  Date: 11/15/2023   ID: Christina Lewis, Christina Lewis Dec 21, 1955, MRN [id number]  PCP:  Severa Rock HERO, FNP  Cardiologist:  Diannah SHAUNNA Maywood, MD Electrophysiologist:  None   History of Present Illness: Christina Lewis is a 68 y.o. female  Referred to cardiology clinic for evaluation of DOE.  Patient has a history of rheumatoid arthritis on methotrexate  and Plaquenil .  Patient reports having dyspnea exertion mainly at rest and symptoms with exertion.  She reports having pain below her left breast and radiating to her back.  Occasionally it also radiates to middle of her chest.  No chest pain with exertion.  No dizziness, lightheadedness, syncope, leg swelling.  Past Medical History:  Diagnosis Date   Achilles tendonitis    right foot   Allergy     Anemia    Anxiety    Arthritis    Cataract 2020   Depression    Diabetes mellitus    type 2   Eczema    Fibromyalgia    GERD (gastroesophageal reflux disease)    Gout    no current problem   Hematuria    History of kidney stones    passed stones and also had surgery to remove one   History of tics    Hyperlipidemia 2007   Ineffective esophageal motility    tx with prilosec, essophageal was stretched   Interstitial cystitis 2016   Migraines    Osteoarthritis    PCOS (polycystic ovarian syndrome) 1993   Plantar fasciitis, bilateral    Pneumonia 2019   PONV (postoperative nausea and vomiting)    Recurrent upper respiratory infection (URI)    Sleep apnea    does not use CPAP   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, took medication for a year   Thyroid  nodule    Dr Bella is monitoring every year    Past Surgical History:  Procedure Laterality Date   ABDOMINAL HYSTERECTOMY  2000   APPENDECTOMY  1976   BALLOON DILATION N/A 11/03/2021   Procedure: MERRILL DILATION;  Surgeon: Saintclair Jasper, MD;  Location: [facility];  Service: Gastroenterology;  Laterality: N/A;   BOTOX  INJECTION N/A  11/03/2021   Procedure: BOTOX  INJECTION;  Surgeon: Saintclair Jasper, MD;  Location: [facility];  Service: Gastroenterology;  Laterality: N/A;   BREAST LUMPECTOMY  1989   lt-negative   CARDIOVASCULAR STRESS TEST  2000   CATARACT EXTRACTION W/PHACO Left 12/07/2019   Procedure: CATARACT EXTRACTION PHACO AND INTRAOCULAR LENS PLACEMENT (IOC);  Surgeon: Harrie Agent, MD;  Location: [facility];  Service: Ophthalmology;  Laterality: Left;  CDE: 5.55   CHOLECYSTECTOMY  1985   COLONOSCOPY     DRUG INDUCED ENDOSCOPY Bilateral 09/29/2022   Procedure: DRUG INDUCED ENDOSCOPY;  Surgeon: Okey Burns, MD;  Location: [facility];  Service: ENT;  Laterality: Bilateral;  15 MINUTES   ESOPHAGEAL MANOMETRY N/A 08/24/2021   Procedure: ESOPHAGEAL MANOMETRY (EM);  Surgeon: Saintclair Jasper, MD;  Location: [facility];  Service: Gastroenterology;  Laterality: N/A;   ESOPHAGOGASTRODUODENOSCOPY N/A 11/03/2021   Procedure: ESOPHAGOGASTRODUODENOSCOPY (EGD);  Surgeon: Saintclair Jasper, MD;  Location: [facility];  Service: Gastroenterology;  Laterality: N/A;   EYE SURGERY  2021   HYSTERECTOMY ABDOMINAL WITH SALPINGECTOMY Bilateral 2000   IMPLANTATION OF HYPOGLOSSAL NERVE STIMULATOR Right 11/24/2022   Procedure: IMPLANTATION OF HYPOGLOSSAL NERVE STIMULATOR;  Surgeon: Okey Burns, MD;  Location: [facility];  Service: ENT;  Laterality: Right;   KIDNEY STONE SURGERY Right 2014  with stent placement in OR   KNEE ARTHROSCOPY Right 08/22/2013   Procedure: ARTHROSCOPY RIGHT KNEE FOR INFECTION LAVAGE AND DRAINAGE;  Surgeon: LELON JONETTA Shari Mickey., MD;  Location: [facility];  Service: Orthopedics;  Laterality: Right;   KNEE ARTHROSCOPY WITH PATELLA RECONSTRUCTION Right 08/06/2013   Procedure: RIGHT KNEE ARTHROSCOPY WITH MENISCECTOMY MEDIAL, ARTHROSCOPY KNEE WITH DEBRIDEMENT/SHAVING (CHONDROPLASTY) ;  Surgeon: LELON JONETTA Shari Mickey., MD;  Location: [facility];  Service: Orthopedics;  Laterality: Right;    LUMBAR LAMINECTOMY/DECOMPRESSION MICRODISCECTOMY Right 09/11/2019   Procedure: Right Lumbar Three-Four Microdiscectomy;  Surgeon: Unice Pac, MD;  Location: [facility];  Service: Neurosurgery;  Laterality: Right;  Right Lumbar Three-Four Microdiscectomy   NASAL SINUS SURGERY     x4   OOPHORECTOMY     SHOULDER SURGERY  2011   right shoulder   SPINE SURGERY  2020   THYROID  SURGERY  1994   TONSILLECTOMY Bilateral 1974    Current Outpatient Medications  Medication Sig Dispense Refill   baclofen  (LIORESAL ) 10 MG tablet TAKE 1 TABLET BY MOUTH IN THE MORNING AND 1 AT NOON 60 tablet 0   budesonide  (PULMICORT ) 0.5 MG/2ML nebulizer solution Take 2 mLs (0.5 mg total) by nebulization in the morning and at bedtime. MIX 1 VIAL WITH 250CC OF NORMAL SALINE FOR SINUS WASH DAILY AS NEEDED 120 mL 12   Carbinoxamine  Maleate 4 MG TABS Take 2 tablets (8 mg total) by mouth 2 (two) times daily as needed (allergies). 120 tablet 5   cholecalciferol (VITAMIN D ) 25 MCG (1000 UNIT) tablet Take 1,000 Units by mouth daily.     EPINEPHrine  0.3 mg/0.3 mL IJ SOAJ injection Inject 0.3 mg into the muscle as needed for anaphylaxis. 0.3 mL 1   Eptinezumab -jjmr (VYEPTI  IV) Inject 300 mg into the vein every 3 (three) months.     Evolocumab  (REPATHA  SURECLICK) 140 MG/ML SOAJ Inject 140 mg into the skin every 14 (fourteen) days. 2 mL 2   folic acid  (FOLVITE ) 1 MG tablet TAKE TWO TABLETS BY MOUTH EVERY DAY 180 tablet 3   hydroxychloroquine  (PLAQUENIL ) 200 MG tablet TAKE 1 TABLET TWICE DAILY MONDAY THRU FRIDAY FOR RHEUMATOID ARTHRTITIS. 120 tablet 0   ipratropium (ATROVENT ) 0.03 % nasal spray Place 2 sprays into both nostrils every 12 (twelve) hours. 30 mL 12   Lancets (ONETOUCH DELICA PLUS LANCET33G) MISC SMARTSIG:1 Topical Daily     MAGNESIUM  PO Take 500 mg by mouth at bedtime.     methotrexate  50 MG/2ML injection INJECT 0.8 MLS (20 MG TOTAL) INTO THE SKIN ONCE A WEEK 10 mL 0   montelukast  (SINGULAIR ) 10 MG tablet Take 1 tablet (10  mg total) by mouth at bedtime. 30 tablet 0   Multiple Vitamin (MULTIVITAMIN WITH MINERALS) TABS tablet Take 1 tablet by mouth daily.     omeprazole (PRILOSEC) 20 MG capsule Take 40 mg by mouth daily.     ondansetron  (ZOFRAN -ODT) 4 MG disintegrating tablet Take 1 tablet (4 mg total) by mouth every 8 (eight) hours as needed for nausea. 20 tablet 5   ONETOUCH VERIO test strip 1 each daily.     Polyvinyl Alcohol -Povidone (REFRESH OP) Apply to eye as needed.     Rimegepant Sulfate (NURTEC) 75 MG TBDP Take 1 tablet (75 mg total) by mouth daily as needed. NDC [address]-8, lot [id number] exp 09/2025 4 tablet    tacrolimus  (PROTOPIC ) 0.1 % ointment Apply topically 2 (two) times daily. 60 g 6   tiZANidine  (ZANAFLEX ) 4 MG tablet TAKE 1  TABLET BY MOUTH AT BEDTIME AS NEEDED 30 tablet 0   TUBERCULIN SYR 1CC/27GX1/2 (B-D TB SYRINGE 1CC/27GX1/2) 27G X 1/2 1 ML MISC 12 Syringes by Does not apply route once a week. 12 each 3   No current facility-administered medications for this visit.   Allergies:  Sulfa antibiotics, Sulfamethoxazole-trimethoprim, Atorvastatin, Codeine, Erythromycin, Metronidazole , Morphine and codeine, and Semaglutide   Social History: The patient  reports that she has never smoked. She has been exposed to tobacco smoke. She has never used smokeless tobacco. She reports that she does not drink alcohol  and does not use drugs.   Family History: The patient's family history includes Arthritis in her mother, sister, and sister; Asthma in her mother, sister, and sister; Diabetes in her mother, sister, and sister; Fibromyalgia in her mother; Heart disease in her mother; Hyperlipidemia in her mother, sister, and sister; Hypertension in her mother and sister; Miscarriages / Stillbirths in her sister and sister; Psoriasis in her mother and sister; Rheum arthritis in her sister; Stroke in her mother.   ROS:  Please see the history of present illness. Otherwise, complete review of systems is positive for  none  All other systems are reviewed and negative.   Physical Exam: VS:  BP 118/72   Pulse 76   Ht 5' 4 (1.626 m)   Wt 163 lb 6.4 oz (74.1 kg)   SpO2 96%   BMI 28.05 kg/m , BMI Body mass index is 28.05 kg/m.  Wt Readings from Last 3 Encounters:  11/15/23 163 lb 6.4 oz (74.1 kg)  11/10/23 164 lb 6.4 oz (74.6 kg)  10/13/23 166 lb 12.8 oz (75.7 kg)    General: Patient appears comfortable at rest. HEENT: Conjunctiva and lids normal, oropharynx clear with moist mucosa. Neck: Supple, no elevated JVP or carotid bruits, no thyromegaly. Lungs: Clear to auscultation, nonlabored breathing at rest. Cardiac: Regular rate and rhythm, no S3 or significant systolic murmur, no pericardial rub. Abdomen: Soft, nontender, no hepatomegaly, bowel sounds present, no guarding or rebound. Extremities: No pitting edema, distal pulses 2+. Skin: Warm and dry. Musculoskeletal: No kyphosis. Neuropsychiatric: Alert and oriented x3, affect grossly appropriate.  Recent Labwork: 02/11/2023: TSH 0.762 08/15/2023: ALT 18; AST 16; BUN 21; Creat 0.72; Hemoglobin 13.9; Platelets 399; Potassium 4.6; Sodium 139     Component Value Date/Time   CHOL 319 (H) 02/11/2023 1035   TRIG 432 (H) 02/11/2023 1035   HDL 50 02/11/2023 1035   CHOLHDL 6.4 (H) 02/11/2023 1035   LDLCALC 182 (H) 02/11/2023 1035    Assessment and Plan:  DOE - Patient reports having dyspnea on exertion mainly at rest and sometimes with exertion.  Obtain echocardiogram.  Noncardiac chest pain - Patient reports having pain under her left breast and radiating to her back.  Symptoms occasionally it also radiates to the middle of her chest.  This has been ongoing for the last 1 week.  No new chest pain with exertion.  Will monitor for now.  No further cardiac workup at this time.  Hyperlipidemia, not at goal Hypertriglyceridemia, not at goal Elevated coronary calcium score - Coronary calcium score is 20.8, 63rd percentile for age and sex matched  control.  Intolerant to statins.  Start Repatha  140 mg [place] every 2 weeks. - Obtain fasting lipid panel in 3 months. - OTC fish oil supplements.   45 minutes spent in reviewing prior records, imaging, test/reports, discussion of the above problems with the patient, documentation and answering all the questions.     Medication  Adjustments/Labs and Tests Ordered: Current medicines are reviewed at length with the patient today.  Concerns regarding medicines are outlined above.    Disposition:  Follow up 3 months  Signed Sircharles Holzheimer Priya Roscoe Witts, MD, 11/15/2023 4:46 PM    [facility] [address] [place], [place], [place] [address]

## 2023-11-16 ENCOUNTER — Telehealth: Payer: Self-pay | Admitting: Pharmacy Technician

## 2023-11-16 ENCOUNTER — Other Ambulatory Visit (HOSPITAL_COMMUNITY): Payer: Self-pay

## 2023-11-16 NOTE — Telephone Encounter (Signed)
   Pharmacy Patient Advocate Encounter   Received notification from Pt Calls Messages that prior authorization for repatha  is required/requested.   Insurance verification completed.   The patient is insured through [facility] ADVANTAGE/RX ADVANCE .   Per test claim: PA required; PA submitted to above mentioned insurance via Latent Key/confirmation #/EOC [id number] Status is pending

## 2023-11-16 NOTE — Telephone Encounter (Signed)
 Pharmacy Patient Advocate Encounter  Received notification from [facility] ADVANTAGE/RX ADVANCE that Prior Authorization for REPATHA  has been APPROVED from 11/16/23 to 05/14/24   PA #/Case ID/Reference #: [id number]

## 2023-11-17 ENCOUNTER — Encounter: Payer: Self-pay | Admitting: Adult Health

## 2023-11-21 NOTE — Telephone Encounter (Signed)
 PT called back to ask about scheduling [facility], stated had not gotten a call back yet. I advised that I did have a note back to [place]/Nicole to get scheduled, but think they are backed up at the moment, confirmed [phone] call back

## 2023-11-23 ENCOUNTER — Ambulatory Visit (INDEPENDENT_AMBULATORY_CARE_PROVIDER_SITE_OTHER): Admitting: Adult Health

## 2023-11-23 VITALS — BP 111/70 | HR 79 | Ht 64.0 in | Wt 164.6 lb

## 2023-11-23 DIAGNOSIS — G43701 Chronic migraine without aura, not intractable, with status migrainosus: Secondary | ICD-10-CM | POA: Diagnosis not present

## 2023-11-23 MED ORDER — GABAPENTIN 100 MG PO CAPS: 100.0000 mg | ORAL_CAPSULE | Freq: Every day | ORAL | 11 refills | Status: DC

## 2023-11-23 NOTE — Patient Instructions (Signed)
 Your Plan:  Continue Vyepti  infusions for now Use Nurtec for abortive therapy  Start Gabapentin 100 mg at bedtime. Read over medication before starting MRI brain  If your symptoms worsen or you develop new symptoms please let us  know.    Thank you for coming to see us  at [facility]. I hope we have been able to provide you high quality care today.  You may receive a patient satisfaction survey over the next few weeks. We would appreciate your feedback and comments so that we may continue to improve ourselves and the health of our patients.
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
## 2023-11-24 ENCOUNTER — Telehealth: Payer: Self-pay | Admitting: Adult Health

## 2023-11-24 NOTE — Telephone Encounter (Signed)
 no auth required sent to [facility] she has Inspire. [phone]

## 2023-11-25 DIAGNOSIS — F331 Major depressive disorder, recurrent, moderate: Secondary | ICD-10-CM | POA: Diagnosis not present

## 2023-11-25 DIAGNOSIS — F411 Generalized anxiety disorder: Secondary | ICD-10-CM | POA: Diagnosis not present

## 2023-11-28 ENCOUNTER — Other Ambulatory Visit: Payer: Self-pay | Admitting: Rheumatology

## 2023-11-28 ENCOUNTER — Other Ambulatory Visit: Payer: Self-pay | Admitting: Physician Assistant

## 2023-11-28 DIAGNOSIS — M0609 Rheumatoid arthritis without rheumatoid factor, multiple sites: Secondary | ICD-10-CM

## 2023-11-28 NOTE — Telephone Encounter (Signed)
 Last Fill: 11/01/2023  Next Visit: 12/22/2023  Last Visit: 07/21/2023  DX: Fibromyalgia   Current Dose per office note on 07/21/2023: not discussed.   Okay to refill baclofen  and tizanidine ?

## 2023-11-28 NOTE — Telephone Encounter (Signed)
 Last Fill: 09/07/2023  Eye exam: 01/24/2023   Labs: 08/15/2023 Glucose is 118. Rest of CMP WNL.   WBC count is elevated-16.6. 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   Okay to refill Plaquenil ?

## 2023-11-29 ENCOUNTER — Ambulatory Visit: Payer: Self-pay

## 2023-11-29 ENCOUNTER — Ambulatory Visit: Admitting: Family Medicine

## 2023-11-29 ENCOUNTER — Encounter: Payer: Self-pay | Admitting: Family Medicine

## 2023-11-29 VITALS — BP 118/70 | HR 80 | Temp 98.0°F | Ht 64.0 in | Wt 164.0 lb

## 2023-11-29 DIAGNOSIS — N898 Other specified noninflammatory disorders of vagina: Secondary | ICD-10-CM | POA: Diagnosis not present

## 2023-11-29 DIAGNOSIS — N3000 Acute cystitis without hematuria: Secondary | ICD-10-CM | POA: Diagnosis not present

## 2023-11-29 DIAGNOSIS — R300 Dysuria: Secondary | ICD-10-CM | POA: Diagnosis not present

## 2023-11-29 LAB — URINALYSIS, ROUTINE W REFLEX MICROSCOPIC
Bilirubin Urine: NEGATIVE
Glucose, UA: NEGATIVE
Hyaline Cast: NONE SEEN /LPF
Ketones, ur: NEGATIVE
Nitrite: NEGATIVE
Specific Gravity, Urine: 1.026 (ref 1.001–1.035)
pH: 5.5 (ref 5.0–8.0)

## 2023-11-29 LAB — MICROSCOPIC MESSAGE

## 2023-11-29 LAB — WET PREP FOR TRICH, YEAST, CLUE

## 2023-11-29 MED ORDER — CEPHALEXIN 500 MG PO CAPS: 500.0000 mg | ORAL_CAPSULE | Freq: Two times a day (BID) | ORAL | 0 refills | Status: DC

## 2023-11-29 NOTE — Progress Notes (Signed)
 Subjective:  HPI: Christina Lewis is a 68 y.o. female presenting on 11/29/2023 for Acute Visit (Since being on antibiotic she has started having Vaginal, anal, and full body  itching and burning started Friday night )   HPI Patient is in today for vaginal, anal, and full body itching since Friday when she started a course of Amoxicillin  to treat a UTI. She reports having had a stomach bug last week with subsequent UTI. Symptoms included urinary frequency with burning and lower abdominal pain. Was self treating with Amoxicillin  and her symptoms have improved within 1-2 days. Now she is experiencing itching all over that started as vaginal itching. She treated this with Clotrimazole and Monistat. She continues to experience vaginal itching and burning without discharge but is also experiencing itching all over, especially in her orifices such as her vagina, rectum, ears, eyes, and nose. No rash other than some eczema on her hand. She did take a benadryl  and that has helped some.  She denies fever, chills, body aches, hematuria, abdominal or flank pain, nausea, vomiting, diarrhea.   Review of Systems  All other systems reviewed and are negative.   Relevant past medical history reviewed and updated as indicated.   Past Medical History:  Diagnosis Date   Achilles tendonitis    right foot   Allergy     Anemia    Anxiety    Arthritis 2007   Cataract 2020   Depression 2012   Diabetes mellitus    type 2   Eczema    Fibromyalgia    GERD (gastroesophageal reflux disease) 2008   Gout    no current problem   Hematuria    History of kidney stones    passed stones and also had surgery to remove one   History of tics    Hyperlipidemia 2007   Ineffective esophageal motility    tx with prilosec, essophageal was stretched   Interstitial cystitis 2016   Migraines    Osteoarthritis    PCOS (polycystic ovarian syndrome) 1993   Plantar fasciitis, bilateral    Pneumonia 2019   PONV  (postoperative nausea and vomiting)    Recurrent upper respiratory infection (URI)    Sleep apnea 1998   does not use CPAP   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, took medication for a year   Thyroid  nodule    Dr Bella is monitoring every year     Past Surgical History:  Procedure Laterality Date   ABDOMINAL HYSTERECTOMY  2000   APPENDECTOMY  1976   BALLOON DILATION N/A 11/03/2021   Procedure: MERRILL DILATION;  Surgeon: Saintclair Jasper, MD;  Location: [facility];  Service: Gastroenterology;  Laterality: N/A;   BOTOX  INJECTION N/A 11/03/2021   Procedure: BOTOX  INJECTION;  Surgeon: Saintclair Jasper, MD;  Location: [facility];  Service: Gastroenterology;  Laterality: N/A;   BREAST LUMPECTOMY  1989   lt-negative   CARDIOVASCULAR STRESS TEST  2000   CATARACT EXTRACTION W/PHACO Left 12/07/2019   Procedure: CATARACT EXTRACTION PHACO AND INTRAOCULAR LENS PLACEMENT (IOC);  Surgeon: Harrie Agent, MD;  Location: [facility];  Service: Ophthalmology;  Laterality: Left;  CDE: 5.55   CHOLECYSTECTOMY  1985   COLONOSCOPY     DRUG INDUCED ENDOSCOPY Bilateral 09/29/2022   Procedure: DRUG INDUCED ENDOSCOPY;  Surgeon: Okey Burns, MD;  Location: [facility];  Service: ENT;  Laterality: Bilateral;  15 MINUTES   ESOPHAGEAL MANOMETRY N/A 08/24/2021   Procedure: ESOPHAGEAL MANOMETRY (EM);  Surgeon: Saintclair,  Estelita, MD;  Location: [facility];  Service: Gastroenterology;  Laterality: N/A;   ESOPHAGOGASTRODUODENOSCOPY N/A 11/03/2021   Procedure: ESOPHAGOGASTRODUODENOSCOPY (EGD);  Surgeon: Saintclair Estelita, MD;  Location: [facility];  Service: Gastroenterology;  Laterality: N/A;   EYE SURGERY  2021   HYSTERECTOMY ABDOMINAL WITH SALPINGECTOMY Bilateral 2000   IMPLANTATION OF HYPOGLOSSAL NERVE STIMULATOR Right 11/24/2022   Procedure: IMPLANTATION OF HYPOGLOSSAL NERVE STIMULATOR;  Surgeon: Okey Burns, MD;  Location: [facility];  Service: ENT;  Laterality: Right;   KIDNEY STONE SURGERY  Right 2014   with stent placement in OR   KNEE ARTHROSCOPY Right 08/22/2013   Procedure: ARTHROSCOPY RIGHT KNEE FOR INFECTION LAVAGE AND DRAINAGE;  Surgeon: LELON JONETTA Shari Mickey., MD;  Location: [facility];  Service: Orthopedics;  Laterality: Right;   KNEE ARTHROSCOPY WITH PATELLA RECONSTRUCTION Right 08/06/2013   Procedure: RIGHT KNEE ARTHROSCOPY WITH MENISCECTOMY MEDIAL, ARTHROSCOPY KNEE WITH DEBRIDEMENT/SHAVING (CHONDROPLASTY) ;  Surgeon: LELON JONETTA Shari Mickey., MD;  Location: [facility];  Service: Orthopedics;  Laterality: Right;   LUMBAR LAMINECTOMY/DECOMPRESSION MICRODISCECTOMY Right 09/11/2019   Procedure: Right Lumbar Three-Four Microdiscectomy;  Surgeon: Unice Pac, MD;  Location: [facility];  Service: Neurosurgery;  Laterality: Right;  Right Lumbar Three-Four Microdiscectomy   NASAL SINUS SURGERY     x4   OOPHORECTOMY     SHOULDER SURGERY  2011   right shoulder   SPINE SURGERY  2020   THYROID  SURGERY  1994   TONSILLECTOMY Bilateral 1974    Allergies and medications reviewed and updated.   Current Outpatient Medications:    baclofen  (LIORESAL ) 10 MG tablet, TAKE 1 TABLET BY MOUTH IN THE MORNING THEN TAKE 1 TABLET AT NOON, Disp: 60 tablet, Rfl: 0   cephALEXin (KEFLEX) 500 MG capsule, Take 1 capsule (500 mg total) by mouth 2 (two) times daily., Disp: 14 capsule, Rfl: 0   cholecalciferol (VITAMIN D ) 25 MCG (1000 UNIT) tablet, Take 1,000 Units by mouth daily., Disp: , Rfl:    EPINEPHrine  0.3 mg/0.3 mL IJ SOAJ injection, Inject 0.3 mg into the muscle as needed for anaphylaxis., Disp: 0.3 mL, Rfl: 1   Eptinezumab -jjmr (VYEPTI  IV), Inject 300 mg into the vein every 3 (three) months., Disp: , Rfl:    folic acid  (FOLVITE ) 1 MG tablet, TAKE TWO TABLETS BY MOUTH EVERY DAY, Disp: 180 tablet, Rfl: 3   hydroxychloroquine  (PLAQUENIL ) 200 MG tablet, TAKE 1 TABLET BY MOUTH TWICE DAILY ON  MONDAY  THRU  FRIDAY  FOR  RHEUMATOID  ARTHRITIS, Disp: 120 tablet, Rfl: 0   Lancets  (ONETOUCH DELICA PLUS LANCET33G) MISC, SMARTSIG:1 Topical Daily, Disp: , Rfl:    MAGNESIUM  PO, Take 500 mg by mouth at bedtime., Disp: , Rfl:    methotrexate  50 MG/2ML injection, INJECT 0.8 MLS (20 MG TOTAL) INTO THE SKIN ONCE A WEEK, Disp: 10 mL, Rfl: 0   Multiple Vitamin (MULTIVITAMIN WITH MINERALS) TABS tablet, Take 1 tablet by mouth daily., Disp: , Rfl:    Omega 3 1000 MG CAPS, Take 1,000 mg by mouth daily., Disp: , Rfl:    omeprazole (PRILOSEC) 20 MG capsule, Take 40 mg by mouth daily., Disp: , Rfl:    ondansetron  (ZOFRAN -ODT) 4 MG disintegrating tablet, Take 1 tablet (4 mg total) by mouth every 8 (eight) hours as needed for nausea., Disp: 20 tablet, Rfl: 5   ONETOUCH VERIO test strip, 1 each daily., Disp: , Rfl:    Polyvinyl Alcohol -Povidone (REFRESH OP), Apply to eye as needed., Disp: , Rfl:    Rimegepant  Sulfate (NURTEC) 75 MG TBDP, Take 1 tablet (75 mg total) by mouth daily as needed. NDC [address]-8, lot [id number] exp 09/2025, Disp: 4 tablet, Rfl:    tacrolimus  (PROTOPIC ) 0.1 % ointment, Apply topically 2 (two) times daily., Disp: 60 g, Rfl: 6   tiZANidine  (ZANAFLEX ) 4 MG tablet, TAKE 1 TABLET BY MOUTH AT BEDTIME AS NEEDED, Disp: 30 tablet, Rfl: 0   TUBERCULIN SYR 1CC/27GX1/2 (B-D TB SYRINGE 1CC/27GX1/2) 27G X 1/2 1 ML MISC, 12 Syringes by Does not apply route once a week., Disp: 12 each, Rfl: 3  Allergies  Allergen Reactions   Sulfa Antibiotics Anaphylaxis and Other (See Comments)   Sulfamethoxazole-Trimethoprim Anaphylaxis   Atorvastatin Other (See Comments)    Severe Muscle and Joint pain with ALL statins    Codeine Itching   Erythromycin Nausea And Vomiting   Metronidazole  Diarrhea and Other (See Comments)   Morphine And Codeine Itching   Semaglutide Nausea And Vomiting    Ozempic*    Objective:   BP 118/70   Pulse 80   Temp 98 F (36.7 C)   Ht 5' 4 (1.626 m)   Wt 164 lb (74.4 kg)   SpO2 97%   BMI 28.15 kg/m      11/29/2023    2:18 PM 11/23/2023    9:06 AM  11/15/2023    3:25 PM  Vitals with BMI  Height 5' 4 5' 4 5' 4  Weight 164 lbs 164 lbs 10 oz 163 lbs 6 oz  BMI 28.14 28.24 28.03  Systolic 118 111 881  Diastolic 70 70 72  Pulse 80 79 76     Physical Exam Vitals and nursing note reviewed.  Constitutional:      Appearance: Normal appearance. She is normal weight.  HENT:     Head: Normocephalic and atraumatic.  Abdominal:     General: Bowel sounds are normal.     Palpations: Abdomen is soft.     Tenderness: There is no abdominal tenderness. There is no right CVA tenderness or left CVA tenderness.  Skin:    General: Skin is warm and dry.  Neurological:     General: No focal deficit present.     Mental Status: She is alert and oriented to person, place, and time. Mental status is at baseline.  Psychiatric:        Mood and Affect: Mood normal.        Behavior: Behavior normal.        Thought Content: Thought content normal.        Judgment: Judgment normal.     Assessment & Plan:  Acute cystitis without hematuria Assessment & Plan: Urine dipstick shows positive for RBC's, positive for protein, and positive for leukocytes.  Micro exam: 6-10 WBC's per HPF, 3-10 RBC's per HPF, and few+ bacteria. Wet prep + for epi cells, moderate bacteria and WBCs. Will treat with Keflex 500mg  BID x7d. Advised to keep a diary of medications she is taking and the itching symptoms. May continue Benadryl  PRN. Be mindful of future doses of amoxicillin  and monitor for s/s of anaphylaxis. Follow up PRN   Vaginal itching -     WET PREP FOR TRICH, YEAST, CLUE  Dysuria -     Urinalysis, Routine w reflex microscopic -     Urine Culture  Other orders -     Cephalexin; Take 1 capsule (500 mg total) by mouth 2 (two) times daily.  Dispense: 14 capsule; Refill: 0     Follow up plan:  Return if symptoms worsen or fail to improve.  Jeoffrey GORMAN Barrio, FNP  I personally spent a total of 30 minutes in the care of the patient today including preparing to  see the patient, getting/reviewing separately obtained history, performing a medically appropriate exam/evaluation, counseling and educating, placing orders, documenting clinical information in the EHR, independently interpreting results, and communicating results.

## 2023-11-29 NOTE — Assessment & Plan Note (Signed)
 Urine dipstick shows positive for RBC's, positive for protein, and positive for leukocytes.  Micro exam: 6-10 WBC's per HPF, 3-10 RBC's per HPF, and few+ bacteria. Wet prep + for epi cells, moderate bacteria and WBCs. Will treat with Keflex 500mg  BID x7d. Advised to keep a diary of medications she is taking and the itching symptoms. May continue Benadryl  PRN. Be mindful of future doses of amoxicillin  and monitor for s/s of anaphylaxis. Follow up PRN

## 2023-11-29 NOTE — Telephone Encounter (Signed)
 NOTED

## 2023-11-29 NOTE — Telephone Encounter (Signed)
 Appointment made for today 11/29/2023 at [facility] with Jeoffrey Barrio, FNP at 2:30pm No availability at PCP office   FYI Only or Action Required?: FYI only for provider.  Patient was last seen in primary care on 11/10/2023 by Severa Rock HERO, FNP.  Called Nurse Triage reporting Vaginal Itching.  Symptoms began within the past week.  Interventions attempted: OTC medications: Amoxicillin  for UTI and Prescription medications: Monistat.  Symptoms are: unchanged.  Triage Disposition: See Physician Within 24 Hours  Patient/caregiver understands and will follow disposition?: Yes                 Copied from CRM #[id number]. Topic: Clinical - Red Word Triage >> Nov 29, 2023  1:07 PM Mia F wrote: Red Word that prompted transfer to Nurse Triage:Possible yeast infection. Pt says she tried treating with otc medication and now she is experiencing itching al over the body. Pt states she is also experiencing burning when urinating and irritation in the vaginal area. Reason for Disposition  MODERATE-SEVERE itching (i.e., interferes with school, work, or sleep)  Answer Assessment - Initial Assessment Questions Last week--patient had a 24 hour stomach bug with loose stools--then the next day she had a UTI and started Amoxicillin --tomorrow should be the last day of this antibiotic.---she states she had a telemedicine visit for this diagnosis/prescription Patient also states she takes methotrexate  for rheumatoid arthritis. Patient suspects yeast infection--she has had them in the past and used Monistat in the past Patient used over the counter Monistat Patient states itching all over now--she isnt sure if it might be yeast & she also has eczema--denies hives or rashes, difficulty breathing, or chest pain Patient states that she is miserable with these symptoms No availability at PCP office today so patient scheduled at [facility] today Patient is advised that if anything  worsens to go to the Emergency Room. Patient verbalized understanding.   5. ITCHING: Is there any itching? If Yes, ask: How bad is it? (Scale: 1-10; mild, moderate, severe)     Moderate---itching and burning in vaginal area and now itching all over 6. CAUSE: What do you think is causing the discharge? Have you had the same problem before? What happened then?     N/a 7. OTHER SYMPTOMS: Do you have any other symptoms? (e.g., fever, itching, vaginal bleeding, pain with urination, injury to genital area, vaginal foreign body)     Itching all over  Protocols used: Vaginal Symptoms-A-AH

## 2023-11-30 ENCOUNTER — Ambulatory Visit: Attending: Internal Medicine

## 2023-11-30 DIAGNOSIS — R0609 Other forms of dyspnea: Secondary | ICD-10-CM | POA: Diagnosis not present

## 2023-11-30 LAB — ECHOCARDIOGRAM COMPLETE
AR max vel: 2.95 cm2
AV Peak grad: 13.1 mmHg
Ao pk vel: 1.81 m/s
Area-P 1/2: 4.36 cm2
Calc EF: 72.3 %
S' Lateral: 2.5 cm
Single Plane A2C EF: 74 %
Single Plane A4C EF: 69.4 %

## 2023-11-30 LAB — URINE CULTURE
MICRO NUMBER:: 17067388
Result:: NO GROWTH
SPECIMEN QUALITY:: ADEQUATE

## 2023-11-30 MED ORDER — BD TB SYRINGE 27G X 1/2" 1 ML MISC: 12.0000 | 3 refills | Status: DC

## 2023-11-30 NOTE — Telephone Encounter (Signed)
 Last Fill: 04/15/2023  Next Visit: 12/22/2023  Last Visit: 07/21/2023  Dx: Rheumatoid arthritis of multiple sites with negative rheumatoid factor   Current Dose per office note on 07/21/2023: not discussed  Okay to refill Syringes?

## 2023-12-01 ENCOUNTER — Ambulatory Visit: Admitting: Family Medicine

## 2023-12-02 ENCOUNTER — Ambulatory Visit: Payer: Self-pay | Admitting: Internal Medicine

## 2023-12-04 ENCOUNTER — Encounter (HOSPITAL_COMMUNITY): Payer: Self-pay

## 2023-12-04 ENCOUNTER — Encounter: Payer: Self-pay | Admitting: Allergy

## 2023-12-05 ENCOUNTER — Ambulatory Visit (HOSPITAL_COMMUNITY)
Admission: RE | Admit: 2023-12-05 | Discharge: 2023-12-05 | Disposition: A | Discharge status: Home / Self Care | Source: Ambulatory Visit | Attending: Adult Health | Admitting: Adult Health

## 2023-12-05 DIAGNOSIS — R519 Headache, unspecified: Secondary | ICD-10-CM | POA: Diagnosis not present

## 2023-12-05 DIAGNOSIS — G43701 Chronic migraine without aura, not intractable, with status migrainosus: Secondary | ICD-10-CM | POA: Diagnosis not present

## 2023-12-05 DIAGNOSIS — F332 Major depressive disorder, recurrent severe without psychotic features: Secondary | ICD-10-CM | POA: Diagnosis not present

## 2023-12-05 DIAGNOSIS — F411 Generalized anxiety disorder: Secondary | ICD-10-CM | POA: Diagnosis not present

## 2023-12-05 MED ORDER — GADOBUTROL 1 MMOL/ML IV SOLN
7.0000 mL | Freq: Once | INTRAVENOUS | Status: AC | PRN
Start: 2023-12-05 — End: 2023-12-05
  Administered 2023-12-05: 7 mL via INTRAVENOUS

## 2023-12-07 ENCOUNTER — Ambulatory Visit: Payer: Self-pay | Admitting: Adult Health

## 2023-12-07 ENCOUNTER — Ambulatory Visit: Admitting: Family Medicine

## 2023-12-07 ENCOUNTER — Ambulatory Visit (INDEPENDENT_AMBULATORY_CARE_PROVIDER_SITE_OTHER): Payer: Self-pay

## 2023-12-07 DIAGNOSIS — J309 Allergic rhinitis, unspecified: Secondary | ICD-10-CM | POA: Diagnosis not present

## 2023-12-07 NOTE — Progress Notes (Signed)
 Immunotherapy   Patient Details  Name: Christina Lewis MRN: [id number] Date of Birth: 31-Dec-1955  12/07/2023  [name] started injections for  pollens and pets Following schedule: B  Frequency:2 times per week Epi-Pen:Epi-Pen Available  Consent signed in office today and patient instructions given. Patient waited in the lobby for thirty minutes without an issue.    [name] 12/07/2023, 2:05 PM

## 2023-12-07 NOTE — Telephone Encounter (Signed)
 Dr. Jeneal reached out to me about her allergy  shots. For some reason, she was scheduled yet again in [facility] (over one hour from her home) for rush in November. I called her and just offered to do traditional shots in the [place] office. She will come in at 1:30 for her first injection today. She was very appreciative of this.    Marty Shaggy, MD [facility] 

## 2023-12-08 ENCOUNTER — Ambulatory Visit: Admitting: Internal Medicine

## 2023-12-08 DIAGNOSIS — F331 Major depressive disorder, recurrent, moderate: Secondary | ICD-10-CM | POA: Diagnosis not present

## 2023-12-08 DIAGNOSIS — F411 Generalized anxiety disorder: Secondary | ICD-10-CM | POA: Diagnosis not present

## 2023-12-08 DIAGNOSIS — F332 Major depressive disorder, recurrent severe without psychotic features: Secondary | ICD-10-CM | POA: Diagnosis not present

## 2023-12-08 NOTE — Progress Notes (Signed)
 Office Visit Note  Patient: Christina Lewis             Date of Birth: 1955/07/02           MRN: [id number]             PCP: Severa Rock HERO, FNP Referring: Severa Rock HERO, FNP Visit Date: 12/22/2023 Occupation: Data Unavailable  Subjective:  Total body pain   History of Present Illness: Christina Lewis is a 68 y.o. female with history of seronegative rheumatoid arthritis.  Patient remains on  Rasuvo  20 mg sq injections once weekly, folic acid  2 mg daily, Plaquenil  200 mg 1 tablet by mouth twice daily Monday through Friday.  She is tolerating combination therapy without any side effects and has not had any recent gaps in therapy.  Patient reports for about the past 6 to 8 weeks she has been experiencing increased total body pain.  She describes the pain as an aching sensation.  She is also having increased daytime drowsiness and muscle fatigue.  Patient feels that she may be having a rheumatoid arthritis flare as well as a fibromyalgia flare.  She has noticed increased pain in the right shoulder, cervical spine, and right wrist.  She has had intermittent swelling in the right wrist and left ankle.  She is been taking Tylenol  as needed for pain relief.  Activities of Daily Living:  Patient reports morning stiffness for depends on the day.   Patient Reports nocturnal pain.  Difficulty dressing/grooming: Denies Difficulty climbing stairs: Reports Difficulty getting out of chair: Reports Difficulty using hands for taps, buttons, cutlery, and/or writing: Reports  Review of Systems  Constitutional:  Positive for fatigue.  HENT:  Negative for mouth sores and mouth dryness.   Eyes:  Positive for dryness.  Cardiovascular:  Negative for palpitations.  Gastrointestinal:  Negative for blood in stool, constipation and diarrhea.  Endocrine: Positive for increased urination.  Genitourinary:  Negative for involuntary urination.  Musculoskeletal:  Positive for joint pain, joint pain, myalgias, muscle  weakness, morning stiffness, muscle tenderness and myalgias. Negative for gait problem and joint swelling.  Skin:  Positive for color change and rash. Negative for hair loss and sensitivity to sunlight.  Allergic/Immunologic: Negative for susceptible to infections.  Neurological:  Positive for headaches. Negative for dizziness.  Hematological:  Negative for swollen glands.  Psychiatric/Behavioral:  Positive for depressed mood and sleep disturbance. The patient is not nervous/anxious.     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   Vitamin D  deficiency 10/15/2021   Polycystic ovaries 10/15/2021   Migraine without aura and without status migrainosus, not intractable 10/15/2021   BMI 29.0-29.9,adult 10/15/2021   Rheumatoid arthritis (HCC) 03/04/2021   Type 2 diabetes mellitus with obesity 03/04/2021   Right foot infection 03/04/2021   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 myelopathy 09/11/2019   Leukocytosis 11/04/2016   Fibromyalgia 02/10/2016   Other fatigue 02/10/2016   Primary osteoarthritis of both hands 02/10/2016   History of migraine 02/10/2016   History of thyroid   nodule 02/10/2016   History of sleep apnea 02/10/2016   History of renal calculi 02/10/2016   Pain in joint, shoulder region 10/02/2010   Adhesive capsulitis of shoulder 10/02/2010   Muscle weakness (generalized) 10/02/2010    Past Medical History:  Diagnosis Date   Achilles tendonitis    right foot    Allergy     Anemia    Anxiety    Arthritis 2007   Cataract 2020   Depression 2012   Diabetes mellitus    type 2   Eczema    Fibromyalgia    GERD (gastroesophageal reflux disease) 2008   Gout    no current problem   Hematuria    History of kidney stones    passed stones and also had surgery to remove one   History of tics    Hyperlipidemia 2007   Ineffective esophageal motility    tx with prilosec, essophageal was stretched   Interstitial cystitis 2016   Migraines    Osteoarthritis    PCOS (polycystic ovarian syndrome) 1993   Plantar fasciitis, bilateral    Pneumonia 2019   PONV (postoperative nausea and vomiting)    Recurrent upper respiratory infection (URI)    Sleep apnea 1998   does not use CPAP   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, took medication for a year   Thyroid  nodule    Dr Bella is monitoring every year    Family History  Problem Relation Age of Onset   Fibromyalgia Mother    Psoriasis Mother        psoriatic arthritis    Diabetes Mother    Heart disease Mother    Arthritis Mother    Asthma Mother    Hyperlipidemia Mother    Hypertension Mother    Stroke Mother    Rheum arthritis Sister    Arthritis Sister    Asthma Sister    Diabetes Sister    Hyperlipidemia Sister    Miscarriages / Stillbirths Sister    Psoriasis Sister        psoriatic arthritis   Arthritis Sister    Asthma Sister    Diabetes Sister    Hyperlipidemia Sister    Hypertension Sister    Miscarriages / Stillbirths Sister    Past Surgical History:  Procedure Laterality Date   ABDOMINAL HYSTERECTOMY  2000   APPENDECTOMY  1976   BALLOON DILATION N/A 11/03/2021   Procedure: MERRILL HODGKIN;  Surgeon: Saintclair Jasper, MD;  Location: [facility];  Service: Gastroenterology;  Laterality: N/A;   BOTOX  INJECTION N/A 11/03/2021   Procedure: BOTOX  INJECTION;  Surgeon: Saintclair Jasper, MD;  Location: [facility];  Service: Gastroenterology;  Laterality: N/A;   BREAST  LUMPECTOMY  1989   lt-negative   CARDIOVASCULAR STRESS TEST  2000   CATARACT EXTRACTION W/PHACO Left 12/07/2019   Procedure: CATARACT EXTRACTION PHACO AND INTRAOCULAR LENS PLACEMENT (IOC);  Surgeon: Harrie Agent, MD;  Location: [facility];  Service: Ophthalmology;  Laterality: Left;  CDE: 5.55   CHOLECYSTECTOMY  1985   COLONOSCOPY     DRUG INDUCED ENDOSCOPY Bilateral 09/29/2022   Procedure: DRUG INDUCED ENDOSCOPY;  Surgeon: Okey Burns, MD;  Location: [facility];  Service: ENT;  Laterality: Bilateral;  15 MINUTES   ESOPHAGEAL MANOMETRY N/A 08/24/2021   Procedure: ESOPHAGEAL MANOMETRY (EM);  Surgeon: Saintclair Jasper, MD;  Location: [facility];  Service: Gastroenterology;  Laterality: N/A;   ESOPHAGOGASTRODUODENOSCOPY N/A 11/03/2021   Procedure: ESOPHAGOGASTRODUODENOSCOPY (EGD);  Surgeon: Saintclair Jasper, MD;  Location: [facility];  Service: Gastroenterology;  Laterality: N/A;   EYE SURGERY  2021   HYSTERECTOMY ABDOMINAL WITH SALPINGECTOMY Bilateral 2000   IMPLANTATION OF HYPOGLOSSAL NERVE STIMULATOR Right 11/24/2022   Procedure: IMPLANTATION OF HYPOGLOSSAL NERVE STIMULATOR;  Surgeon: Okey Burns, MD;  Location: [facility];  Service: ENT;  Laterality: Right;   KIDNEY STONE SURGERY Right 2014   with stent placement in OR   KNEE ARTHROSCOPY Right 08/22/2013   Procedure: ARTHROSCOPY RIGHT KNEE FOR INFECTION LAVAGE AND DRAINAGE;  Surgeon: LELON JONETTA Shari Mickey., MD;  Location: [facility];  Service: Orthopedics;  Laterality: Right;   KNEE ARTHROSCOPY WITH PATELLA RECONSTRUCTION Right 08/06/2013   Procedure: RIGHT KNEE ARTHROSCOPY WITH MENISCECTOMY MEDIAL, ARTHROSCOPY KNEE WITH DEBRIDEMENT/SHAVING (CHONDROPLASTY) ;  Surgeon: LELON JONETTA Shari Mickey., MD;  Location: [facility];  Service: Orthopedics;  Laterality: Right;   LUMBAR LAMINECTOMY/DECOMPRESSION MICRODISCECTOMY Right 09/11/2019   Procedure: Right Lumbar Three-Four Microdiscectomy;  Surgeon: Unice Pac, MD;   Location: [facility];  Service: Neurosurgery;  Laterality: Right;  Right Lumbar Three-Four Microdiscectomy   NASAL SINUS SURGERY     x4   OOPHORECTOMY     SHOULDER SURGERY  2011   right shoulder   SPINE SURGERY  2020   THYROID  SURGERY  1994   TONSILLECTOMY Bilateral 1974   Social History   Tobacco Use   Smoking status: Never    Passive exposure: Past   Smokeless tobacco: Never  Vaping Use   Vaping status: Never Used  Substance Use Topics   Alcohol  use: Never   Drug use: Never   Social History   Social History Narrative   Caffiene 4-5 12 oz cans soda.   Working retired - special ed.   Live with home no kids     Immunization History  Administered Date(s) Administered   Tdap 03/04/2021   Zoster Recombinant(Shingrix ) 10/15/2021     Objective: Vital Signs: BP 123/75   Pulse 87   Temp 98 F (36.7 C)   Resp 14   Ht 5' 4 (1.626 m)   Wt 168 lb 6.4 oz (76.4 kg)   BMI 28.91 kg/m    Physical Exam Vitals and nursing note reviewed.  Constitutional:      Appearance: She is well-developed.  HENT:     Head: Normocephalic and atraumatic.  Eyes:     Conjunctiva/sclera: Conjunctivae normal.  Cardiovascular:     Rate and Rhythm: Normal rate and regular rhythm.     Heart sounds: Normal heart sounds.  Pulmonary:     Effort: Pulmonary effort is normal.     Breath sounds: Normal breath sounds.  Abdominal:     General: Bowel sounds are normal.     Palpations: Abdomen is soft.  Musculoskeletal:     Cervical back: Normal range of motion.  Lymphadenopathy:     Cervical: No cervical adenopathy.  Skin:    General: Skin is warm and dry.     Capillary Refill: Capillary refill takes less than 2 seconds.  Neurological:     Mental Status: She is alert and oriented to person, place, and time.  Psychiatric:        Behavior: Behavior normal.      Musculoskeletal Exam: Generalized hyperalgesia and positive tender points on exam.  C-spine has painful and limited mobility.  Limited  mobility of the lumbar spine.  Shoulder joints, elbow joints, wrist joints, MCPs, PIPs, DIPs have good range of motion with no synovitis.  Tenderness of both wrist joints.  Tenderness of all MCP and PIP joints.  Complete fist formation bilaterally.  Hip joints have good range of motion with no groin pain.  Tenderness over the right trochanteric bursa.  Knee joints have good range of motion no warmth or effusion.  Ankle joints have good range of motion no tenderness or joint swelling.     CDAI Exam: CDAI Score: -- Patient Global: --; Provider Global: -- Swollen: --; Tender: -- Joint Exam 12/22/2023   No joint exam has been documented for this visit   There is currently no information documented on the homunculus. Go to the Rheumatology activity and complete the homunculus joint exam.  Investigation: No additional findings.  Imaging: MR BRAIN W WO CONTRAST Result Date: 12/07/2023 CLINICAL DATA:  Chronic headaches EXAM: MRI HEAD WITHOUT AND WITH CONTRAST TECHNIQUE: Multiplanar, multiecho pulse sequences of the brain and surrounding structures were obtained without and with intravenous contrast. CONTRAST:  7mL GADAVIST GADOBUTROL 1 MMOL/ML IV SOLN COMPARISON:  None Available. FINDINGS: MRI brain: The brain volume is normal. The signal in the brain parenchyma is normal. No abnormal enhancement. There is no acute or chronic infarct. The ventricles are normal. No mass lesion. There are normal flow signals in the carotid arteries and basilar artery. No significant bone marrow signal abnormality. No significant abnormality in the paranasal sinuses or soft tissues. IMPRESSION: Normal Electronically Signed   By: Nancyann Burns M.D.   On: 12/07/2023 11:31   ECHOCARDIOGRAM COMPLETE Result Date: 11/30/2023    ECHOCARDIOGRAM REPORT   Patient Name:   LEEBA BARBE Date of Exam: 11/30/2023 Medical Rec #:  [id number]       Height:       64.0 in Accession #:    [id number]      Weight:       164.0 lb Date of Birth:   [birth year]      BSA:          1.798 m Patient Age:    67 years        BP:           118/70 mmHg Patient Gender: F               HR:           82 bpm. Exam Location:  [facility] Procedure: 2D Echo, Cardiac Doppler and Color Doppler (Both Spectral and Color            Flow Doppler were utilized during procedure). Indications:    R06.9 DOE  History:        Patient has no prior history of Echocardiogram examinations.                 Signs/Symptoms:Fatigue; Risk Factors:Diabetes, Dyslipidemia and                 Non-Smoker.  Sonographer:    Bascom Burows RCS, RVS Referring Phys: [id number] VISHNU P MALLIPEDDI  Sonographer Comments: Global longitudinal strain was attempted. IMPRESSIONS  1. Left ventricular ejection fraction, by estimation, is 65 to 70%. The left ventricle has normal function. The left ventricle has no regional wall motion abnormalities. Left ventricular diastolic parameters are indeterminate.  2. Right ventricular systolic function is normal. The right ventricular size is normal. Tricuspid regurgitation signal is inadequate for assessing PA pressure.  3. The mitral valve is grossly normal. Trivial mitral valve regurgitation.  4. The aortic valve is tricuspid. Aortic valve regurgitation is not visualized. No aortic stenosis is present.  5. The inferior vena  cava is normal in size with greater than 50% respiratory variability, suggesting right atrial pressure of 3 mmHg. Comparison(s): No prior Echocardiogram. FINDINGS  Left Ventricle: Left ventricular ejection fraction, by estimation, is 65 to 70%. The left ventricle has normal function. The left ventricle has no regional wall motion abnormalities. The left ventricular internal cavity size was normal in size. There is  no left ventricular hypertrophy. Left ventricular diastolic parameters are indeterminate. Right Ventricle: The right ventricular size is normal. No increase in right ventricular wall thickness. Right ventricular systolic function is normal. Tricuspid  regurgitation signal is inadequate for assessing PA pressure. Left Atrium: Left atrial size was normal in size. Right Atrium: Right atrial size was normal in size. Pericardium: There is no evidence of pericardial effusion. Presence of epicardial fat layer. Mitral Valve: The mitral valve is grossly normal. Trivial mitral valve regurgitation. MV peak gradient, 9.6 mmHg. The mean mitral valve gradient is 3.0 mmHg. Tricuspid Valve: The tricuspid valve is grossly normal. Tricuspid valve regurgitation is trivial. Aortic Valve: The aortic valve is tricuspid. Aortic valve regurgitation is not visualized. No aortic stenosis is present. Aortic valve peak gradient measures 13.1 mmHg. Pulmonic Valve: The pulmonic valve was grossly normal. Pulmonic valve regurgitation is trivial. Aorta: The aortic root and ascending aorta are structurally normal, with no evidence of dilitation. Venous: The inferior vena cava is normal in size with greater than 50% respiratory variability, suggesting right atrial pressure of 3 mmHg. IAS/Shunts: No atrial level shunt detected by color flow Doppler. Additional Comments: 3D was performed not requiring image post processing on an independent workstation and was indeterminate.  LEFT VENTRICLE PLAX 2D LVIDd:         4.10 cm     Diastology LVIDs:         2.50 cm     LV e' medial:    8.16 cm/s LV PW:         0.80 cm     LV E/e' medial:  9.4 LV IVS:        0.80 cm     LV e' lateral:   10.10 cm/s LVOT diam:     2.00 cm     LV E/e' lateral: 7.6 LVOT Area:     3.14 cm  LV Volumes (MOD) LV vol d, MOD A2C: 53.0 ml LV vol d, MOD A4C: 37.2 ml LV vol s, MOD A2C: 13.8 ml LV vol s, MOD A4C: 11.4 ml LV SV MOD A2C:     39.2 ml LV SV MOD A4C:     37.2 ml LV SV MOD BP:      33.0 ml RIGHT VENTRICLE RV Basal diam:  3.00 cm     PULMONARY VEINS RV Mid diam:    2.70 cm     Diastolic Velocity: 48.10 cm/s RV S prime:     14.10 cm/s  S/D Velocity:       1.60                             Systolic Velocity:  76.20 cm/s LEFT  ATRIUM             Index        RIGHT ATRIUM          Index LA diam:        2.50 cm 1.39 cm/m   RA Area:     9.04 cm LA Vol (A2C):   30.3 ml 16.85 ml/m  RA Volume:   14.80 ml 8.23 ml/m LA Vol (A4C):   20.2 ml 11.26 ml/m LA Biplane Vol: 28.6 ml 15.91 ml/m  AORTIC VALVE                 PULMONIC VALVE AV Area (Vmax): 2.95 cm     PV Vmax:       1.38 m/s AV Vmax:        181.00 cm/s  PV Peak grad:  7.7 mmHg AV Peak Grad:   13.1 mmHg LVOT Vmax:      170.00 cm/s  AORTA Ao Root diam: 2.90 cm Ao Asc diam:  2.70 cm MITRAL VALVE MV Area (PHT): 4.36 cm     SHUNTS MV Peak grad:  9.6 mmHg     Systemic Diam: 2.00 cm MV Mean grad:  3.0 mmHg MV Vmax:       1.55 m/s MV Vmean:      82.6 cm/s MV Decel Time: 174 msec MV E velocity: 77.10 cm/s MV A velocity: 137.00 cm/s MV E/A ratio:  0.56 Jayson Sierras MD Electronically signed by Jayson Sierras MD Signature Date/Time: 11/30/2023/4:45:52 PM    Final     Recent Labs: Lab Results  Component Value Date   WBC 16.6 (H) 08/15/2023   HGB 13.9 08/15/2023   PLT 399 08/15/2023   NA 139 08/15/2023   K 4.6 08/15/2023   CL 103 08/15/2023   CO2 28 08/15/2023   GLUCOSE 118 (H) 08/15/2023   BUN 21 08/15/2023   CREATININE 0.72 08/15/2023   BILITOT 0.2 08/15/2023   ALKPHOS 85 02/11/2023   AST 16 08/15/2023   ALT 18 08/15/2023   PROT 6.6 08/15/2023   ALBUMIN 4.6 02/11/2023   CALCIUM 9.9 08/15/2023   GFRAA 98 03/24/2020   QFTBGOLDPLUS NEGATIVE 09/29/2021    Speciality Comments: PLQ Eye Exam: 01/24/2023 WNL @ [facility]     Procedures:  No procedures performed Allergies: Sulfa antibiotics, Sulfamethoxazole-trimethoprim, Atorvastatin, Codeine, Erythromycin, Metronidazole , Morphine and codeine, and Semaglutide       Assessment / Plan:     Visit Diagnoses: Rheumatoid arthritis of multiple sites with negative rheumatoid factor (HCC) -Patient presents today with increased joint pain, joint stiffness, myofascial pain, and fatigue for the past 6 to 8  weeks.  She has had increased discomfort in the right shoulder, cervical spine, right wrist, and left ankle.  She has noticed increased inflammation in both hands and both wrist joints.  She has been taking Tylenol  as needed for symptomatic relief.  Plan to check sed rate and CRP today.  Plan to also schedule ultrasound of both hands to assess for synovitis.  She has remained on methotrexate  0.8 mg sq injections once weekly, folic acid  2 mg daily, Plaquenil  200 mg 1 tablet by mouth twice daily Monday to Friday.  She is tolerating combination therapy without any side effects and has not had any recent gaps in therapy.  No medication changes will be made at this time.  If the ultrasound is positive or if the inflammatory markers are elevated we can consider different treatment options.  Plan to also refer the patient to pain management since her symptoms of fibromyalgia have been inadequately controlled.  plan: Sedimentation rate, C-reactive protein  High risk medication use - Methotrexate  0.8 ml sq injections once weekly, folic acid  2 mg daily, Plaquenil  200 mg 1 tablet by mouth twice daily Monday through Friday.  CBC and CMP updated on 08/15/23. Orders for CBC and CMP released today.  PLQ Eye Exam: 01/24/2023 WNL @ [facility].  She had updated Pap and eye examination yesterday and has advised to have results forwarded to our office to review. Discussed the importance of holding rasuvo  if she develops signs or symptoms of an infection and to resume once the infection has completely cleared  - Plan: CBC with Differential/Platelet, Comprehensive metabolic panel with GFR  Chronic SI joint pain - Unremarkable XR of the pelvis.  Severe multilevel spondylosis and scoliosis of the lumbar spine.  She had good response to SI joint injection in February 2025.  Doing well.   History of positive PPD  Primary osteoarthritis of both hands: She presents today with increased pain and stiffness in both hands.   She has had intermittent swelling in the right wrist.  She has tenderness at all MCP and PIP joints.  Plan to check sed rate and CRP today.  Plan to also schedule ultrasound to assess for synovitis.  Primary osteoarthritis of right knee: Intermittent discomfort.  No warmth or effusion noted today.  Primary osteoarthritis of both feet: She has noticed intermittent swelling in the left ankle.  No synovitis noted on examination today.  Plan to check sed rate and CRP.  Other fatigue: Patient has had chronic fatigue but the fatigue has been worse for the past 8 weeks.  DDD (degenerative disc disease), cervical - MRI of the cervical spine revealed mild cervical spondylosis.  Mild left foraminal narrowing at C3-C4 and C5-C6 noted.  Followed by Dr. Mavis. She has been experiencing increased pain and stiffness in the C-spine.  She has limited ROM with lateral rotation.  No symptoms of radiculopathy.  She takes baclofen  10 mg daily as needed for muscle spasms and tizanidine  4 mg at bedtime as needed. Plan to refer the patient to pain management as requested.  Degeneration of intervertebral disc of lumbar region without discogenic back pain or lower extremity pain: Previously underwent surgical intervention.    Fibromyalgia - She has generalized hyperalgesia and positive tender points on exam.  She has been experiencing increased myalgias and muscle tenderness due to fibromyalgia.  She has also had increased muscle fatigue and daytime drowsiness.  She takes baclofen  10 mg daily as needed and tizanidine  4 mg at bedtime as needed for muscle spasms.   Plan to refer the patient to pain management.   Trochanteric bursitis of right hip: Improved.  She has been able to lay on her right side at night which is an improvement.  She continues to have some soreness upon palpation today.  Other medical conditions are listed as follows:  Family history of rheumatoid arthritis-Sister  Family history of psoriatic  arthritis-mother and sister  History of diabetes mellitus  Mixed hyperlipidemia  History of gastroesophageal reflux (GERD)  History of sleep apnea  History of depression  History of thyroid  nodule  History of cellulitis  History of renal calculi  History of PCOS  History of tics  History of migraine  Orders: Orders Placed This Encounter  Procedures   CBC with Differential/Platelet   Comprehensive metabolic panel with GFR   Sedimentation rate   C-reactive protein   No orders of the defined types were placed in this encounter.    Follow-Up Instructions: Return in about 5 months (around 05/21/2024).   Waddell CHRISTELLA Craze, PA-C  Note - This record has been created using Dragon software.  Chart creation errors have been sought, but may not always  have been located. Such creation errors do not  reflect on  the standard of medical care.

## 2023-12-13 ENCOUNTER — Telehealth: Payer: HMO | Admitting: Adult Health

## 2023-12-16 ENCOUNTER — Ambulatory Visit: Admitting: Internal Medicine

## 2023-12-21 ENCOUNTER — Ambulatory Visit (INDEPENDENT_AMBULATORY_CARE_PROVIDER_SITE_OTHER): Payer: Self-pay

## 2023-12-21 DIAGNOSIS — E119 Type 2 diabetes mellitus without complications: Secondary | ICD-10-CM | POA: Diagnosis not present

## 2023-12-21 DIAGNOSIS — Z961 Presence of intraocular lens: Secondary | ICD-10-CM | POA: Diagnosis not present

## 2023-12-21 DIAGNOSIS — H04123 Dry eye syndrome of bilateral lacrimal glands: Secondary | ICD-10-CM | POA: Diagnosis not present

## 2023-12-21 DIAGNOSIS — H43812 Vitreous degeneration, left eye: Secondary | ICD-10-CM | POA: Diagnosis not present

## 2023-12-21 DIAGNOSIS — H01005 Unspecified blepharitis left lower eyelid: Secondary | ICD-10-CM | POA: Diagnosis not present

## 2023-12-21 DIAGNOSIS — J309 Allergic rhinitis, unspecified: Secondary | ICD-10-CM

## 2023-12-21 DIAGNOSIS — Z79891 Long term (current) use of opiate analgesic: Secondary | ICD-10-CM | POA: Diagnosis not present

## 2023-12-21 DIAGNOSIS — H2511 Age-related nuclear cataract, right eye: Secondary | ICD-10-CM | POA: Diagnosis not present

## 2023-12-21 DIAGNOSIS — M069 Rheumatoid arthritis, unspecified: Secondary | ICD-10-CM | POA: Diagnosis not present

## 2023-12-21 LAB — OPHTHALMOLOGY REPORT-SCANNED: A Comment: NORMAL

## 2023-12-22 ENCOUNTER — Other Ambulatory Visit: Payer: Self-pay

## 2023-12-22 ENCOUNTER — Encounter: Payer: Self-pay | Admitting: Physician Assistant

## 2023-12-22 ENCOUNTER — Ambulatory Visit: Attending: Physician Assistant | Admitting: Physician Assistant

## 2023-12-22 VITALS — BP 123/75 | HR 87 | Temp 98.0°F | Resp 14 | Ht 64.0 in | Wt 168.4 lb

## 2023-12-22 DIAGNOSIS — M19071 Primary osteoarthritis, right ankle and foot: Secondary | ICD-10-CM | POA: Diagnosis not present

## 2023-12-22 DIAGNOSIS — G8929 Other chronic pain: Secondary | ICD-10-CM

## 2023-12-22 DIAGNOSIS — Z79899 Other long term (current) drug therapy: Secondary | ICD-10-CM | POA: Diagnosis not present

## 2023-12-22 DIAGNOSIS — M51369 Other intervertebral disc degeneration, lumbar region without mention of lumbar back pain or lower extremity pain: Secondary | ICD-10-CM

## 2023-12-22 DIAGNOSIS — M797 Fibromyalgia: Secondary | ICD-10-CM

## 2023-12-22 DIAGNOSIS — Z8659 Personal history of other mental and behavioral disorders: Secondary | ICD-10-CM

## 2023-12-22 DIAGNOSIS — Z872 Personal history of diseases of the skin and subcutaneous tissue: Secondary | ICD-10-CM

## 2023-12-22 DIAGNOSIS — M7061 Trochanteric bursitis, right hip: Secondary | ICD-10-CM

## 2023-12-22 DIAGNOSIS — Z8261 Family history of arthritis: Secondary | ICD-10-CM

## 2023-12-22 DIAGNOSIS — M19041 Primary osteoarthritis, right hand: Secondary | ICD-10-CM | POA: Diagnosis not present

## 2023-12-22 DIAGNOSIS — M0609 Rheumatoid arthritis without rheumatoid factor, multiple sites: Secondary | ICD-10-CM

## 2023-12-22 DIAGNOSIS — Z840 Family history of diseases of the skin and subcutaneous tissue: Secondary | ICD-10-CM

## 2023-12-22 DIAGNOSIS — Z8639 Personal history of other endocrine, nutritional and metabolic disease: Secondary | ICD-10-CM

## 2023-12-22 DIAGNOSIS — Z9289 Personal history of other medical treatment: Secondary | ICD-10-CM | POA: Diagnosis not present

## 2023-12-22 DIAGNOSIS — M533 Sacrococcygeal disorders, not elsewhere classified: Secondary | ICD-10-CM

## 2023-12-22 DIAGNOSIS — Z8742 Personal history of other diseases of the female genital tract: Secondary | ICD-10-CM

## 2023-12-22 DIAGNOSIS — M19042 Primary osteoarthritis, left hand: Secondary | ICD-10-CM

## 2023-12-22 DIAGNOSIS — R5383 Other fatigue: Secondary | ICD-10-CM | POA: Diagnosis not present

## 2023-12-22 DIAGNOSIS — Z8719 Personal history of other diseases of the digestive system: Secondary | ICD-10-CM

## 2023-12-22 DIAGNOSIS — Z8669 Personal history of other diseases of the nervous system and sense organs: Secondary | ICD-10-CM

## 2023-12-22 DIAGNOSIS — E782 Mixed hyperlipidemia: Secondary | ICD-10-CM

## 2023-12-22 DIAGNOSIS — M5030 Other cervical disc degeneration, unspecified cervical region: Secondary | ICD-10-CM

## 2023-12-22 DIAGNOSIS — Z87442 Personal history of urinary calculi: Secondary | ICD-10-CM

## 2023-12-22 DIAGNOSIS — M19072 Primary osteoarthritis, left ankle and foot: Secondary | ICD-10-CM

## 2023-12-22 DIAGNOSIS — M1711 Unilateral primary osteoarthritis, right knee: Secondary | ICD-10-CM

## 2023-12-22 DIAGNOSIS — F411 Generalized anxiety disorder: Secondary | ICD-10-CM | POA: Diagnosis not present

## 2023-12-22 DIAGNOSIS — F331 Major depressive disorder, recurrent, moderate: Secondary | ICD-10-CM | POA: Diagnosis not present

## 2023-12-22 NOTE — Progress Notes (Unsigned)
 Per Waddell Craze, Place referral for pain management.

## 2023-12-23 ENCOUNTER — Ambulatory Visit: Payer: Self-pay | Admitting: Rheumatology

## 2023-12-23 ENCOUNTER — Ambulatory Visit (INDEPENDENT_AMBULATORY_CARE_PROVIDER_SITE_OTHER): Payer: Self-pay

## 2023-12-23 DIAGNOSIS — J309 Allergic rhinitis, unspecified: Secondary | ICD-10-CM | POA: Diagnosis not present

## 2023-12-23 LAB — CBC WITH DIFFERENTIAL/PLATELET
Absolute Lymphocytes: 2171 {cells}/uL (ref 850–3900)
Absolute Monocytes: 637 {cells}/uL (ref 200–950)
Basophils Absolute: 97 {cells}/uL (ref 0–200)
Basophils Relative: 0.9 %
Eosinophils Absolute: 216 {cells}/uL (ref 15–500)
Eosinophils Relative: 2 %
HCT: 43.8 % (ref 35.0–45.0)
Hemoglobin: 14.5 g/dL (ref 11.7–15.5)
MCH: 31.4 pg (ref 27.0–33.0)
MCHC: 33.1 g/dL (ref 32.0–36.0)
MCV: 94.8 fL (ref 80.0–100.0)
MPV: 10.7 fL (ref 7.5–12.5)
Monocytes Relative: 5.9 %
Neutro Abs: 7679 {cells}/uL (ref 1500–7800)
Neutrophils Relative %: 71.1 %
Platelets: 379 Thousand/uL (ref 140–400)
RBC: 4.62 Million/uL (ref 3.80–5.10)
RDW: 13.6 % (ref 11.0–15.0)
Total Lymphocyte: 20.1 %
WBC: 10.8 Thousand/uL (ref 3.8–10.8)

## 2023-12-23 LAB — COMPREHENSIVE METABOLIC PANEL WITH GFR
AG Ratio: 2.2 (calc) (ref 1.0–2.5)
ALT: 27 U/L (ref 6–29)
AST: 22 U/L (ref 10–35)
Albumin: 4.6 g/dL (ref 3.6–5.1)
Alkaline phosphatase (APISO): 77 U/L (ref 37–153)
BUN: 19 mg/dL (ref 7–25)
CO2: 25 mmol/L (ref 20–32)
Calcium: 9.7 mg/dL (ref 8.6–10.4)
Chloride: 106 mmol/L (ref 98–110)
Creat: 0.72 mg/dL (ref 0.50–1.05)
Globulin: 2.1 g/dL (ref 1.9–3.7)
Glucose, Bld: 110 mg/dL — ABNORMAL HIGH (ref 65–99)
Potassium: 4.5 mmol/L (ref 3.5–5.3)
Sodium: 140 mmol/L (ref 135–146)
Total Bilirubin: 0.3 mg/dL (ref 0.2–1.2)
Total Protein: 6.7 g/dL (ref 6.1–8.1)
eGFR: 92 mL/min/1.73m2 (ref >=60)

## 2023-12-23 LAB — C-REACTIVE PROTEIN: CRP: <3 mg/L (ref <8.0)

## 2023-12-23 LAB — SEDIMENTATION RATE: Sed Rate: 6 mm/h (ref 0–30)

## 2023-12-23 NOTE — Progress Notes (Signed)
 Glucose is mildly elevated, CBC normal, sed rate normal, CRP normal

## 2023-12-27 ENCOUNTER — Encounter

## 2023-12-27 ENCOUNTER — Ambulatory Visit: Admitting: Acute Care

## 2023-12-27 DIAGNOSIS — M542 Cervicalgia: Secondary | ICD-10-CM | POA: Diagnosis not present

## 2023-12-27 DIAGNOSIS — M48061 Spinal stenosis, lumbar region without neurogenic claudication: Secondary | ICD-10-CM | POA: Diagnosis not present

## 2023-12-27 DIAGNOSIS — Z6828 Body mass index (BMI) 28.0-28.9, adult: Secondary | ICD-10-CM | POA: Diagnosis not present

## 2023-12-28 ENCOUNTER — Ambulatory Visit: Payer: Self-pay

## 2023-12-28 DIAGNOSIS — F411 Generalized anxiety disorder: Secondary | ICD-10-CM | POA: Diagnosis not present

## 2023-12-28 DIAGNOSIS — J309 Allergic rhinitis, unspecified: Secondary | ICD-10-CM

## 2023-12-28 DIAGNOSIS — F332 Major depressive disorder, recurrent severe without psychotic features: Secondary | ICD-10-CM | POA: Diagnosis not present

## 2023-12-29 DIAGNOSIS — R61 Generalized hyperhidrosis: Secondary | ICD-10-CM | POA: Diagnosis not present

## 2023-12-29 DIAGNOSIS — E282 Polycystic ovarian syndrome: Secondary | ICD-10-CM | POA: Diagnosis not present

## 2023-12-29 DIAGNOSIS — E1165 Type 2 diabetes mellitus with hyperglycemia: Secondary | ICD-10-CM | POA: Diagnosis not present

## 2023-12-29 DIAGNOSIS — M069 Rheumatoid arthritis, unspecified: Secondary | ICD-10-CM | POA: Diagnosis not present

## 2023-12-29 DIAGNOSIS — R1310 Dysphagia, unspecified: Secondary | ICD-10-CM | POA: Diagnosis not present

## 2023-12-29 DIAGNOSIS — E7800 Pure hypercholesterolemia, unspecified: Secondary | ICD-10-CM | POA: Diagnosis not present

## 2023-12-29 DIAGNOSIS — Z789 Other specified health status: Secondary | ICD-10-CM | POA: Diagnosis not present

## 2023-12-29 DIAGNOSIS — E559 Vitamin D deficiency, unspecified: Secondary | ICD-10-CM | POA: Diagnosis not present

## 2023-12-29 DIAGNOSIS — E049 Nontoxic goiter, unspecified: Secondary | ICD-10-CM | POA: Diagnosis not present

## 2023-12-30 ENCOUNTER — Other Ambulatory Visit: Payer: Self-pay | Admitting: Physician Assistant

## 2023-12-30 NOTE — Telephone Encounter (Signed)
 Last Fill: 11/28/2023  Next Visit: 05/24/2024  Last Visit: 12/22/2023  DX: DDD (degenerative disc disease), cervical   Current Dose per office note on 12/22/2023: baclofen  10 mg daily as needed for muscle spasms and tizanidine  4 mg at bedtime as needed.   Okay to refill baclofen  and tizanidine ?

## 2024-01-02 ENCOUNTER — Ambulatory Visit (HOSPITAL_BASED_OUTPATIENT_CLINIC_OR_DEPARTMENT_OTHER)

## 2024-01-02 ENCOUNTER — Ambulatory Visit (INDEPENDENT_AMBULATORY_CARE_PROVIDER_SITE_OTHER): Admitting: Acute Care

## 2024-01-02 ENCOUNTER — Encounter: Payer: Self-pay | Admitting: Acute Care

## 2024-01-02 VITALS — BP 114/70 | HR 108 | Temp 98.4°F | Ht 64.0 in | Wt 168.8 lb

## 2024-01-02 DIAGNOSIS — J3089 Other allergic rhinitis: Secondary | ICD-10-CM

## 2024-01-02 DIAGNOSIS — R0609 Other forms of dyspnea: Secondary | ICD-10-CM

## 2024-01-02 DIAGNOSIS — R911 Solitary pulmonary nodule: Secondary | ICD-10-CM

## 2024-01-02 DIAGNOSIS — R9389 Abnormal findings on diagnostic imaging of other specified body structures: Secondary | ICD-10-CM

## 2024-01-02 DIAGNOSIS — Z789 Other specified health status: Secondary | ICD-10-CM

## 2024-01-02 LAB — PULMONARY FUNCTION TEST
DL/VA % pred: 113 %
DL/VA: 4.73 ml/min/mmHg/L
DLCO unc % pred: 120 %
DLCO unc: 23.73 ml/min/mmHg
FEF 25-75 Post: 3.96 L/s
FEF 25-75 Pre: 3.44 L/s
FEF2575-%Change-Post: 14 %
FEF2575-%Pred-Post: 189 %
FEF2575-%Pred-Pre: 165 %
FEV1-%Change-Post: 3 %
FEV1-%Pred-Post: 122 %
FEV1-%Pred-Pre: 118 %
FEV1-Post: 2.93 L
FEV1-Pre: 2.82 L
FEV1FVC-%Change-Post: -1 %
FEV1FVC-%Pred-Pre: 116 %
FEV6-%Change-Post: 5 %
FEV6-%Pred-Post: 110 %
FEV6-%Pred-Pre: 104 %
FEV6-Post: 3.33 L
FEV6-Pre: 3.15 L
FEV6FVC-%Pred-Post: 104 %
FEV6FVC-%Pred-Pre: 104 %
FVC-%Change-Post: 5 %
FVC-%Pred-Post: 106 %
FVC-%Pred-Pre: 100 %
FVC-Post: 3.33 L
FVC-Pre: 3.15 L
Post FEV1/FVC ratio: 88 %
Post FEV6/FVC ratio: 100 %
Pre FEV1/FVC ratio: 89 %
Pre FEV6/FVC Ratio: 100 %
RV % pred: 145 %
RV: 3.08 L
TLC % pred: 126 %
TLC: 6.39 L

## 2024-01-02 MED ORDER — ALBUTEROL SULFATE HFA 108 (90 BASE) MCG/ACT IN AERS: 1.0000 | INHALATION_SPRAY | Freq: Four times a day (QID) | RESPIRATORY_TRACT | 2 refills | Status: AC | PRN

## 2024-01-02 NOTE — Patient Instructions (Addendum)
 It is good to see you today.  We have reviewed your PFT's . They look relatively normal with exception of changes that may be related to physical deconditioning or airway spasm. You did have a good response to albuterol, so I will prescribe this for you.  Up 1-2 puffs  as needed up to 3 times a day. If you need it more than this , call to be seen.  Follow up with asthma and allergy  as is scheduled.  Continue with their plan of care  Ask if they feel an immune work up may help explain your symptoms. I will see you in January for your follow up [facility]. Consider  returning to [employer]  at the [facility], to help with building your physical conditioning.  Call if you need us  sooner.  Please contact office for sooner follow up if symptoms do not improve or worsen or seek emergency care

## 2024-01-02 NOTE — Patient Instructions (Signed)
 Full PFT performed today.

## 2024-01-02 NOTE — Progress Notes (Signed)
 Full PFT performed today.

## 2024-01-02 NOTE — Progress Notes (Signed)
 History of Present Illness Christina Lewis is a 68 y.o. female never smoker originally seen for a lung nodule, now following up after PFT's for shortness of breath. She is followed by Dr. Shelah and Christina Lites NP.    01/02/2024 Discussed the use of AI scribe software for clinical note transcription with the patient, who gave verbal consent to proceed.  History of Present Illness Christina Lewis is a 68 year old female followed for a pulmonary nodule. While here for the nodule work up she discussed some intermittent shortness of breath. We did PFT's, she is here to review the results. She does endorse that the shortness of breath is worse at night, which is also when she has esophageal spasms. She takes 40 mg of Prilosec in the morning and at night for possible reflux but does not elevate her head during sleep.  She also has chronic sinus issues, and is seen by an allergist for this. She has an [employer] device that she has monitored by [employer]. She states she sometimes feels it is asynchronous with her breathing. I have asked her to follow up with [facility], and Also [employer] to have this evaluated.  We have reviewed her PFT results.They are relatively normal, with the exception of high DLCO. No obstruction, no restriction. She does have a good BD ( +14) response in the mid expiratory flow rate, medium to small airways. We will try albuterol as needed for shortness of breath with exertion, and see if she has significant improvement.   She has chronic sinus issues that have persisted for years despite multiple sinus surgeries. She describes a constant sensation in her sinuses that she cannot clear, leading to frequent sniffing. She has started allergy  injections recently and has been diagnosed with moderate allergies to several things. She has used Flonase  in the past without significant relief and currently does not use it. She has also used a steroid saline wash but stopped due to migraines, which were  linked to a diabetic medication she was taking. She has not been evaluated by an immunologist. I suggested she ask her allergist about a work up with immunology to ensure she has a robust immune system.   She is due for her follow up Ct Chest as surveillance of a pulmonary nodule 02/2024. She denies any weight loss, or hemoptysis. She will follow up after her scan.     Test Results: PFT's 01/02/2024          Calcium Scoring scan 09/02/2023 Punctate subpleural right middle lobe nodule.      Latest Ref Rng & Units 12/22/2023   10:07 AM 08/15/2023   11:06 AM 05/03/2023   10:35 AM  CBC  WBC 3.8 - 10.8 Thousand/uL 10.8  16.6  12.0   Hemoglobin 11.7 - 15.5 g/dL 85.4  86.0  86.0   Hematocrit 35.0 - 45.0 % 43.8  41.8  42.3   Platelets 140 - 400 Thousand/uL 379  399  417        Latest Ref Rng & Units 12/22/2023   10:07 AM 08/15/2023   11:06 AM 05/03/2023   10:35 AM  BMP  Glucose 65 - 99 mg/dL 889  881  80   BUN 7 - 25 mg/dL 19  21  25    Creatinine 0.50 - 1.05 mg/dL 9.27  9.27  9.23   BUN/Creat Ratio 6 - 22 (calc) SEE NOTE:  SEE NOTE:  SEE NOTE:   Sodium 135 - 146 mmol/L 140  139  142   Potassium 3.5 - 5.3 mmol/L 4.5  4.6  4.9   Chloride 98 - 110 mmol/L 106  103  107   CO2 20 - 32 mmol/L 25  28  27    Calcium 8.6 - 10.4 mg/dL 9.7  9.9  89.8     BNP No results found for: BNP  ProBNP No results found for: PROBNP  PFT    Component Value Date/Time   FEV1PRE 2.82 01/02/2024 0941   FEV1POST 2.93 01/02/2024 0941   FVCPRE 3.15 01/02/2024 0941   FVCPOST 3.33 01/02/2024 0941   TLC 6.39 01/02/2024 0941   DLCOUNC 23.73 01/02/2024 0941   PREFEV1FVCRT 89 01/02/2024 0941   PSTFEV1FVCRT 88 01/02/2024 0941    MR BRAIN W WO CONTRAST Result Date: 12/07/2023 CLINICAL DATA:  Chronic headaches EXAM: MRI HEAD WITHOUT AND WITH CONTRAST TECHNIQUE: Multiplanar, multiecho pulse sequences of the brain and surrounding structures were obtained without and with intravenous contrast.  CONTRAST:  7mL GADAVIST GADOBUTROL 1 MMOL/ML IV SOLN COMPARISON:  None Available. FINDINGS: MRI brain: The brain volume is normal. The signal in the brain parenchyma is normal. No abnormal enhancement. There is no acute or chronic infarct. The ventricles are normal. No mass lesion. There are normal flow signals in the carotid arteries and basilar artery. No significant bone marrow signal abnormality. No significant abnormality in the paranasal sinuses or soft tissues. IMPRESSION: Normal Electronically Signed   By: Nancyann Burns M.D.   On: 12/07/2023 11:31     Past medical hx Past Medical History:  Diagnosis Date   Achilles tendonitis    right foot   Allergy     Anemia    Anxiety    Arthritis 2007   Cataract 2020   Depression 2012   Diabetes mellitus    type 2   Eczema    Fibromyalgia    GERD (gastroesophageal reflux disease) 2008   Gout    no current problem   Hematuria    History of kidney stones    passed stones and also had surgery to remove one   History of tics    Hyperlipidemia 2007   Ineffective esophageal motility    tx with prilosec, essophageal was stretched   Interstitial cystitis 2016   Migraines    Osteoarthritis    PCOS (polycystic ovarian syndrome) 1993   Plantar fasciitis, bilateral    Pneumonia 2019   PONV (postoperative nausea and vomiting)    Recurrent upper respiratory infection (URI)    Sleep apnea 1998   does not use CPAP   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 She has been exposed to tobacco smoke. She has never used smokeless tobacco. She reports that she does not drink alcohol  and does  not use drugs.  Tobacco Cessation: Counseling given: Not Answered Never smoker   Past surgical hx, Family hx, Social hx all reviewed.  Current Outpatient Medications on File Prior to Visit  Medication Sig   baclofen  (LIORESAL ) 10 MG tablet TAKE 1 TABLET BY MOUTH IN THE MORNING AND  1 AT NOON   cholecalciferol (VITAMIN D ) 25 MCG (1000 UNIT) tablet Take 1,000 Units by mouth daily.   EPINEPHrine  0.3 mg/0.3 mL IJ SOAJ injection Inject 0.3 mg into the muscle as needed for anaphylaxis.   Eptinezumab -jjmr (VYEPTI  IV) Inject 300 mg into the vein every 3 (three) months.   folic acid  (FOLVITE ) 1 MG tablet TAKE TWO TABLETS BY MOUTH EVERY DAY   gabapentin (NEURONTIN) 100 MG capsule Take 100 mg by mouth daily.   hydroxychloroquine  (PLAQUENIL ) 200 MG tablet TAKE 1 TABLET BY MOUTH TWICE DAILY ON  MONDAY  THRU  FRIDAY  FOR  RHEUMATOID  ARTHRITIS   Lancets (ONETOUCH DELICA PLUS LANCET33G) MISC SMARTSIG:1 Topical Daily   MAGNESIUM  PO Take 500 mg by mouth at bedtime.   methotrexate  50 MG/2ML injection INJECT 0.8 MLS (20 MG TOTAL) INTO THE SKIN ONCE A WEEK   Multiple Vitamin (MULTIVITAMIN WITH MINERALS) TABS tablet Take 1 tablet by mouth daily.   Omega 3 1000 MG CAPS Take 1,000 mg by mouth daily.   omeprazole (PRILOSEC) 20 MG capsule Take 40 mg by mouth daily.   ondansetron  (ZOFRAN -ODT) 4 MG disintegrating tablet Take 1 tablet (4 mg total) by mouth every 8 (eight) hours as needed for nausea.   ONETOUCH VERIO test strip 1 each daily.   Polyvinyl Alcohol -Povidone (REFRESH OP) Apply to eye as needed.   Rimegepant Sulfate (NURTEC) 75 MG TBDP Take 1 tablet (75 mg total) by mouth daily as needed. NDC [address]-8, lot [id number] exp 09/2025   tacrolimus  (PROTOPIC ) 0.1 % ointment Apply topically 2 (two) times daily.   tiZANidine  (ZANAFLEX ) 4 MG tablet TAKE 1 TABLET BY MOUTH AT BEDTIME AS NEEDED   TUBERCULIN SYR 1CC/27GX1/2 (B-D TB SYRINGE 1CC/27GX1/2) 27G X 1/2 1 ML MISC 12 Syringes by Does not apply route once a  week.   TURMERIC-GINGER -BLACK PEPPER PO Take by mouth.   No current facility-administered medications on file prior to visit.     Allergies  Allergen Reactions   Sulfa Antibiotics Anaphylaxis and Other (See Comments)   Sulfamethoxazole-Trimethoprim Anaphylaxis   Atorvastatin Other (See Comments)    Severe Muscle and Joint pain with ALL statins    Codeine Itching   Empagliflozin     Other Reaction(s): Unknown   Erythromycin Nausea And Vomiting   Metronidazole  Diarrhea and Other (See Comments)   Morphine And Codeine Itching   Semaglutide Nausea And Vomiting    Ozempic*   Sitagliptin     Other Reaction(s): Unknown    Review Of Systems:  Constitutional:   No  weight loss, night sweats,  Fevers, chills, fatigue, or  lassitude.  HEENT:   + headaches,  Difficulty swallowing, + esophageal spasm, No  Tooth/dental problems, or  Sore throat,                No sneezing, itching, ear ache, ++nasal congestion, ++post nasal drip,   CV:  No chest pain,  Orthopnea, PND, swelling in lower extremities, anasarca, dizziness, palpitations, syncope.   GI  No heartburn, indigestion, abdominal pain, nausea, vomiting, diarrhea, change in bowel habits, loss of appetite, bloody stools. + GERD  Resp: + shortness of breath with exertion none  at rest.  No excess mucus, no productive cough,  No non-productive cough,  No coughing up of blood.  No change in color of mucus.  No wheezing.  No chest wall deformity  Skin: no rash or lesions.  GU: no dysuria, change in color of urine, no urgency or frequency.  No flank  pain, no hematuria   MS:  No joint pain or swelling.  No decreased range of motion.  No back pain.Some involuntary twitching noted.  Psych:  No change in mood or affect. No depression or anxiety.  No memory loss.   Vital Signs BP 114/70   Pulse (!) 108   Temp 98.4 F (36.9 C) (Oral)   Ht 5' 4 (1.626 m)   Wt 168 lb 12.8 oz (76.6 kg)   SpO2 97%   BMI 28.97 kg/m    Physical  Exam:   Physical Exam MEASUREMENTS: BMI- 28.0. GENERAL: No distress, alert and oriented times 3. EARS NOSE THROAT: No sinus tenderness, tympanic membranes clear, pale nasal mucosa, no oral exudate, no post nasal drip, no lymphadenopathy. CHEST: No wheeze, rales, dullness, no accessory muscle use, no nasal flaring, no sternal retractions. CARDIAC: S1, S2, regular rate and rhythm, no murmur. ABDOMINAL: Soft, non tender. ND, BS present, EXTREMITIES: No clubbing, cyanosis, edema. No obvious deformities NEUROLOGICAL: Normal strength. Alert and oriented x 3, MAE x 4, involuntary twitching SKIN: No rashes, warm and dry. No obvious skin lesions PSYCHIATRIC: Normal mood and behavior.   Assessment/Plan  Assessment and Plan Assessment & Plan Dyspnea with exertion likely due to deconditioning or airway spasm. PFT follow up Albuterol effective.  PFTs relatively  normal with exception of high DLCO -possible physical deconditioning and  airway spasm  Discussed benefits of physical conditioning. - Prescribed albuterol inhaler, 1-2 puffs as needed up to three times a day. - Advised to call if albuterol is needed more frequently. - Encouraged participation in [employer] program. - Follow up with asthma and allergy  specialist as scheduled.  Chronic allergic rhinitis with chronic sinus symptoms Persistent sinus symptoms despite multiple surgeries and Flonase .  Allergy  injections ongoing.  - Continue allergy  injections. - Discuss immunological evaluation with allergist. - Consider referral to Dr. Kozlow for immunological evaluation if appropriate. - Resume saline nasal rinses with steroid.  Lung Nodule found on calcium scoring scan Follow up CT Chest due 02/2024. Plan Follow up 1-2 weeks after scan to review results and determine next best steps in plan of care.  I spent 20 minutes dedicated to the care of this patient on the date of this encounter to include pre-visit review of records,  face-to-face time with the patient discussing conditions above, post visit ordering of testing, clinical documentation with the electronic health record, making appropriate referrals as documented, and communicating necessary information to the patient's healthcare team.        Christina JULIANNA Lites, NP 01/02/2024  11:35 AM

## 2024-01-04 ENCOUNTER — Ambulatory Visit (INDEPENDENT_AMBULATORY_CARE_PROVIDER_SITE_OTHER)

## 2024-01-04 DIAGNOSIS — J309 Allergic rhinitis, unspecified: Secondary | ICD-10-CM

## 2024-01-05 DIAGNOSIS — F411 Generalized anxiety disorder: Secondary | ICD-10-CM | POA: Diagnosis not present

## 2024-01-05 DIAGNOSIS — F332 Major depressive disorder, recurrent severe without psychotic features: Secondary | ICD-10-CM | POA: Diagnosis not present

## 2024-01-06 ENCOUNTER — Ambulatory Visit (INDEPENDENT_AMBULATORY_CARE_PROVIDER_SITE_OTHER)

## 2024-01-06 ENCOUNTER — Ambulatory Visit: Admitting: Internal Medicine

## 2024-01-06 DIAGNOSIS — J309 Allergic rhinitis, unspecified: Secondary | ICD-10-CM | POA: Diagnosis not present

## 2024-01-09 DIAGNOSIS — F331 Major depressive disorder, recurrent, moderate: Secondary | ICD-10-CM | POA: Diagnosis not present

## 2024-01-09 DIAGNOSIS — F411 Generalized anxiety disorder: Secondary | ICD-10-CM | POA: Diagnosis not present

## 2024-01-10 ENCOUNTER — Encounter: Payer: Self-pay | Admitting: Internal Medicine

## 2024-01-11 ENCOUNTER — Ambulatory Visit (INDEPENDENT_AMBULATORY_CARE_PROVIDER_SITE_OTHER)

## 2024-01-11 DIAGNOSIS — G43711 Chronic migraine without aura, intractable, with status migrainosus: Secondary | ICD-10-CM | POA: Diagnosis not present

## 2024-01-11 DIAGNOSIS — J309 Allergic rhinitis, unspecified: Secondary | ICD-10-CM

## 2024-01-11 DIAGNOSIS — J3089 Other allergic rhinitis: Secondary | ICD-10-CM | POA: Diagnosis not present

## 2024-01-12 ENCOUNTER — Ambulatory Visit: Attending: Rheumatology | Admitting: Rheumatology

## 2024-01-12 ENCOUNTER — Telehealth: Payer: Self-pay

## 2024-01-12 ENCOUNTER — Ambulatory Visit

## 2024-01-12 ENCOUNTER — Other Ambulatory Visit (HOSPITAL_COMMUNITY): Payer: Self-pay

## 2024-01-12 ENCOUNTER — Encounter: Payer: Self-pay | Admitting: Rheumatology

## 2024-01-12 VITALS — BP 122/77 | HR 76 | Temp 97.3°F | Resp 15 | Ht 64.0 in | Wt 169.4 lb

## 2024-01-12 DIAGNOSIS — M79642 Pain in left hand: Secondary | ICD-10-CM

## 2024-01-12 DIAGNOSIS — R5383 Other fatigue: Secondary | ICD-10-CM | POA: Diagnosis not present

## 2024-01-12 DIAGNOSIS — M19041 Primary osteoarthritis, right hand: Secondary | ICD-10-CM | POA: Diagnosis not present

## 2024-01-12 DIAGNOSIS — M51369 Other intervertebral disc degeneration, lumbar region without mention of lumbar back pain or lower extremity pain: Secondary | ICD-10-CM

## 2024-01-12 DIAGNOSIS — M1711 Unilateral primary osteoarthritis, right knee: Secondary | ICD-10-CM

## 2024-01-12 DIAGNOSIS — M797 Fibromyalgia: Secondary | ICD-10-CM

## 2024-01-12 DIAGNOSIS — Z8719 Personal history of other diseases of the digestive system: Secondary | ICD-10-CM

## 2024-01-12 DIAGNOSIS — G8929 Other chronic pain: Secondary | ICD-10-CM

## 2024-01-12 DIAGNOSIS — M19071 Primary osteoarthritis, right ankle and foot: Secondary | ICD-10-CM | POA: Diagnosis not present

## 2024-01-12 DIAGNOSIS — M19042 Primary osteoarthritis, left hand: Secondary | ICD-10-CM | POA: Diagnosis not present

## 2024-01-12 DIAGNOSIS — M79641 Pain in right hand: Secondary | ICD-10-CM

## 2024-01-12 DIAGNOSIS — Z8639 Personal history of other endocrine, nutritional and metabolic disease: Secondary | ICD-10-CM

## 2024-01-12 DIAGNOSIS — Z8669 Personal history of other diseases of the nervous system and sense organs: Secondary | ICD-10-CM

## 2024-01-12 DIAGNOSIS — Z79899 Other long term (current) drug therapy: Secondary | ICD-10-CM

## 2024-01-12 DIAGNOSIS — Z8261 Family history of arthritis: Secondary | ICD-10-CM

## 2024-01-12 DIAGNOSIS — Z840 Family history of diseases of the skin and subcutaneous tissue: Secondary | ICD-10-CM

## 2024-01-12 DIAGNOSIS — M5030 Other cervical disc degeneration, unspecified cervical region: Secondary | ICD-10-CM | POA: Diagnosis not present

## 2024-01-12 DIAGNOSIS — E782 Mixed hyperlipidemia: Secondary | ICD-10-CM

## 2024-01-12 DIAGNOSIS — Z8659 Personal history of other mental and behavioral disorders: Secondary | ICD-10-CM

## 2024-01-12 DIAGNOSIS — Z872 Personal history of diseases of the skin and subcutaneous tissue: Secondary | ICD-10-CM

## 2024-01-12 DIAGNOSIS — Z87442 Personal history of urinary calculi: Secondary | ICD-10-CM

## 2024-01-12 DIAGNOSIS — M25511 Pain in right shoulder: Secondary | ICD-10-CM

## 2024-01-12 DIAGNOSIS — M0609 Rheumatoid arthritis without rheumatoid factor, multiple sites: Secondary | ICD-10-CM

## 2024-01-12 DIAGNOSIS — M533 Sacrococcygeal disorders, not elsewhere classified: Secondary | ICD-10-CM | POA: Diagnosis not present

## 2024-01-12 DIAGNOSIS — M5412 Radiculopathy, cervical region: Secondary | ICD-10-CM | POA: Diagnosis not present

## 2024-01-12 DIAGNOSIS — Z8742 Personal history of other diseases of the female genital tract: Secondary | ICD-10-CM

## 2024-01-12 DIAGNOSIS — M19072 Primary osteoarthritis, left ankle and foot: Secondary | ICD-10-CM

## 2024-01-12 MED ORDER — LIDOCAINE HCL 1 % IJ SOLN
1.5000 mL | INTRAMUSCULAR | Status: AC | PRN
  Administered 2024-01-12: 1.5 mL

## 2024-01-12 MED ORDER — TRIAMCINOLONE ACETONIDE 40 MG/ML IJ SUSP
40.0000 mg | INTRAMUSCULAR | Status: AC | PRN
  Administered 2024-01-12: 40 mg via INTRA_ARTICULAR

## 2024-01-12 NOTE — Progress Notes (Signed)
 Office Visit Note  Patient: Christina Lewis             Date of Birth: Apr 17, 1955           MRN: [id number]             PCP: Severa Rock HERO, FNP Referring: Severa Rock HERO, FNP Visit Date: 01/12/2024 Occupation: Data Unavailable  Subjective:  Pain in multiple joints  History of Present Illness: SEVANNA BALLENGEE is a 68 y.o. female with seronegative rheumatoid arthritis.  She was seen today on an urgent basis due to severe pain and discomfort starting in her right shoulder for the last 3 weeks.  She states she has had discomfort in her right shoulder joint but it became more intense.  She states that she has been having nocturnal pain and constant discomfort which has been radiating into her arm.  She continues to have pain and discomfort in her bilateral hands.  She was scheduled to get ultrasound of her hands today.  She denies discomfort in any other joints.  She notices some intermittent swelling in her left ankle.    Activities of Daily Living:  Patient reports morning stiffness for several hours.   Patient Reports nocturnal pain.  Difficulty dressing/grooming: Denies Difficulty climbing stairs: Reports Difficulty getting out of chair: Reports Difficulty using hands for taps, buttons, cutlery, and/or writing: Reports  Review of Systems  Constitutional:  Positive for fatigue.  HENT:  Positive for mouth dryness. Negative for mouth sores.   Eyes:  Positive for dryness.  Respiratory:  Positive for shortness of breath.   Cardiovascular:  Negative for chest pain and palpitations.  Gastrointestinal:  Negative for blood in stool, constipation and diarrhea.  Endocrine: Negative for increased urination.  Genitourinary:  Negative for involuntary urination.  Musculoskeletal:  Positive for joint pain, joint pain, joint swelling, myalgias, morning stiffness, muscle tenderness and myalgias. Negative for gait problem and muscle weakness.  Skin:  Positive for color change. Negative for rash,  hair loss and sensitivity to sunlight.  Allergic/Immunologic: Negative for susceptible to infections.  Neurological:  Positive for numbness. Negative for dizziness and headaches.  Hematological:  Negative for swollen glands.  Psychiatric/Behavioral:  Negative for depressed mood and sleep disturbance. The patient is not nervous/anxious.     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   Vitamin D  deficiency 10/15/2021   Polycystic ovaries 10/15/2021   Migraine without aura and without status migrainosus, not intractable 10/15/2021   BMI 29.0-29.9,adult 10/15/2021   Rheumatoid arthritis (HCC) 03/04/2021   Type 2 diabetes mellitus with obesity 03/04/2021   Right foot infection 03/04/2021   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 myelopathy 09/11/2019   Leukocytosis 11/04/2016   Fibromyalgia 02/10/2016   Other fatigue 02/10/2016   Primary osteoarthritis of both hands 02/10/2016   History of migraine 02/10/2016   History of thyroid  nodule 02/10/2016   History of sleep apnea 02/10/2016   History of renal calculi 02/10/2016   Pain in joint, shoulder region 10/02/2010   Adhesive capsulitis of  shoulder 10/02/2010   Muscle weakness (generalized) 10/02/2010    Past Medical History:  Diagnosis Date   Achilles tendonitis    right foot   Allergy     Anemia    Anxiety    Arthritis 2007   Cataract 2020   Depression 2012   Diabetes mellitus    type 2   Eczema    Fibromyalgia     GERD (gastroesophageal reflux disease) 2008   Gout    no current problem   Hematuria    History of kidney stones    passed stones and also had surgery to remove one   History of tics    Hyperlipidemia 2007   Ineffective esophageal motility    tx with prilosec, essophageal was stretched   Interstitial cystitis 2016   Migraines    Osteoarthritis    PCOS (polycystic ovarian syndrome) 1993   Plantar fasciitis, bilateral    Pneumonia 2019   PONV (postoperative nausea and vomiting)    Recurrent upper respiratory infection (URI)    Sleep apnea 1998   does not use CPAP   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, took medication for a year   Thyroid  nodule    Dr Bella is monitoring every year    Family History  Problem Relation Age of Onset   Fibromyalgia Mother    Psoriasis Mother        psoriatic arthritis    Diabetes Mother    Heart disease Mother    Arthritis Mother    Asthma Mother    Hyperlipidemia Mother    Hypertension Mother    Stroke Mother    Rheum arthritis Sister    Arthritis Sister    Asthma Sister    Diabetes Sister    Hyperlipidemia Sister    Miscarriages / Stillbirths Sister    Psoriasis Sister        psoriatic arthritis   Arthritis Sister    Asthma Sister    Diabetes Sister    Hyperlipidemia Sister    Hypertension Sister    Miscarriages / Stillbirths Sister    Past Surgical History:  Procedure Laterality Date   ABDOMINAL HYSTERECTOMY  2000   APPENDECTOMY  1976   BALLOON DILATION N/A 11/03/2021   Procedure: MERRILL HODGKIN;  Surgeon: Saintclair Jasper, MD;  Location: [facility];  Service: Gastroenterology;  Laterality: N/A;   BOTOX  INJECTION N/A 11/03/2021   Procedure: BOTOX  INJECTION;  Surgeon: Saintclair Jasper, MD;  Location: [facility];  Service: Gastroenterology;  Laterality: N/A;   BREAST LUMPECTOMY  1989   lt-negative   CARDIOVASCULAR STRESS TEST  2000   CATARACT EXTRACTION W/PHACO Left 12/07/2019   Procedure: CATARACT EXTRACTION  PHACO AND INTRAOCULAR LENS PLACEMENT (IOC);  Surgeon: Harrie Agent, MD;  Location: [facility];  Service: Ophthalmology;  Laterality: Left;  CDE: 5.55   CHOLECYSTECTOMY  1985   COLONOSCOPY     DRUG INDUCED ENDOSCOPY Bilateral 09/29/2022   Procedure: DRUG INDUCED ENDOSCOPY;  Surgeon: Okey Burns, MD;  Location: [facility];  Service: ENT;  Laterality: Bilateral;  15 MINUTES   ESOPHAGEAL MANOMETRY N/A 08/24/2021   Procedure: ESOPHAGEAL MANOMETRY (EM);  Surgeon: Saintclair Jasper, MD;  Location: [facility];  Service: Gastroenterology;  Laterality: N/A;   ESOPHAGOGASTRODUODENOSCOPY N/A 11/03/2021   Procedure: ESOPHAGOGASTRODUODENOSCOPY (EGD);  Surgeon: Saintclair Jasper, MD;  Location: [facility];  Service: Gastroenterology;  Laterality: N/A;   EYE SURGERY  2021   HYSTERECTOMY ABDOMINAL WITH SALPINGECTOMY Bilateral 2000   IMPLANTATION OF HYPOGLOSSAL  NERVE STIMULATOR Right 11/24/2022   Procedure: IMPLANTATION OF HYPOGLOSSAL NERVE STIMULATOR;  Surgeon: Okey Burns, MD;  Location: [facility];  Service: ENT;  Laterality: Right;   KIDNEY STONE SURGERY Right 2014   with stent placement in OR   KNEE ARTHROSCOPY Right 08/22/2013   Procedure: ARTHROSCOPY RIGHT KNEE FOR INFECTION LAVAGE AND DRAINAGE;  Surgeon: LELON JONETTA Shari Mickey., MD;  Location: [facility];  Service: Orthopedics;  Laterality: Right;   KNEE ARTHROSCOPY WITH PATELLA RECONSTRUCTION Right 08/06/2013   Procedure: RIGHT KNEE ARTHROSCOPY WITH MENISCECTOMY MEDIAL, ARTHROSCOPY KNEE WITH DEBRIDEMENT/SHAVING (CHONDROPLASTY) ;  Surgeon: LELON JONETTA Shari Mickey., MD;  Location: [facility];  Service: Orthopedics;  Laterality: Right;   LUMBAR LAMINECTOMY/DECOMPRESSION MICRODISCECTOMY Right 09/11/2019   Procedure: Right Lumbar Three-Four Microdiscectomy;  Surgeon: Unice Pac, MD;  Location: [facility];  Service: Neurosurgery;  Laterality: Right;  Right Lumbar Three-Four Microdiscectomy   NASAL SINUS SURGERY     x4   OOPHORECTOMY      SHOULDER SURGERY  2011   right shoulder   SPINE SURGERY  2020   THYROID  SURGERY  1994   TONSILLECTOMY Bilateral 1974   Social History   Tobacco Use   Smoking status: Never    Passive exposure: Past   Smokeless tobacco: Never  Vaping Use   Vaping status: Never Used  Substance Use Topics   Alcohol  use: Never   Drug use: Never   Social History   Social History Narrative   Caffiene 4-5 12 oz cans soda.   Working retired - special ed.   Live with home no kids     Immunization History  Administered Date(s) Administered   Tdap 03/04/2021   Zoster Recombinant(Shingrix ) 10/15/2021     Objective: Vital Signs: BP 122/77   Pulse 76   Temp (!) 97.3 F (36.3 C)   Resp 15   Ht 5' 4 (1.626 m)   Wt 169 lb 6.4 oz (76.8 kg)   BMI 29.08 kg/m    Physical Exam Vitals and nursing note reviewed.  Constitutional:      Appearance: She is well-developed.  HENT:     Head: Normocephalic and atraumatic.  Eyes:     Conjunctiva/sclera: Conjunctivae normal.  Cardiovascular:     Rate and Rhythm: Normal rate and regular rhythm.     Heart sounds: Normal heart sounds.  Pulmonary:     Effort: Pulmonary effort is normal.     Breath sounds: Normal breath sounds.  Abdominal:     General: Bowel sounds are normal.     Palpations: Abdomen is soft.  Musculoskeletal:     Cervical back: Normal range of motion.  Lymphadenopathy:     Cervical: No cervical adenopathy.  Skin:    General: Skin is warm and dry.     Capillary Refill: Capillary refill takes less than 2 seconds.  Neurological:     Mental Status: She is alert and oriented to person, place, and time.  Psychiatric:        Behavior: Behavior normal.      Musculoskeletal Exam: She had limited lateral rotation of the cervical spine with discomfort.  She had painful abduction and internal rotation of her right shoulder joint.  Left shoulder joint was in full range of motion.  Elbows, wrist, MCPs, PIPs and DIPs were in good range of  motion.  She had tenderness on palpation of her left wrist joint and over left 2nd and 3rd MCP joints.  Mild synovitis was noted in the left  second MCP joint.  Hip joints and knee joints with good range of motion.  She had tenderness and mild swelling over the left ankle joint.  There was no tenderness over MTPs.  She generalized hyperalgesia and positive tender points.  CDAI Exam: CDAI Score: 15  Patient Global: 40 / 100; Provider Global: 40 / 100 Swollen: 2 ; Tender: 7  Joint Exam 01/12/2024      Right  Left  Glenohumeral   Tender     Wrist      Tender  MCP 2     Swollen Tender  MCP 3      Tender  MCP 4      Tender  MCP 5      Tender  Ankle     Swollen Tender     Investigation: No additional findings.  Imaging: XR Shoulder Right Result Date: 01/12/2024 No glenohumeral or acromioclavicular joint space narrowing was noted.  No chondrocalcinosis was noted.  A small inferior humeral spur was noted.  Inspire device was noted in the right side of the chest. Impression: Unremarkable x-rays of the shoulder joint.  US  LIMITED JOINT SPACE STRUCTURES UP BILAT Result Date: 01/12/2024 A limited ultrasound examination of bilateral hands was performed per EULAR recommendations. Using 15 MHz transducer, grayscale and power Doppler bilateral second and third MCP joints  both dorsal and volar aspects were evaluated to look for synovitis or tenosynovitis. The findings were there was  synovitis noted in bilateral 2nd and 3rd MCPs  on ultrasound examination. Impression: Synovitis was noted in bilateral 2nd and 3rd MCPs on the limited ultrasound examination of the hands.   Recent Labs: Lab Results  Component Value Date   WBC 10.8 12/22/2023   HGB 14.5 12/22/2023   PLT 379 12/22/2023   NA 140 12/22/2023   K 4.5 12/22/2023   CL 106 12/22/2023   CO2 25 12/22/2023   GLUCOSE 110 (H) 12/22/2023   BUN 19 12/22/2023   CREATININE 0.72 12/22/2023   BILITOT 0.3 12/22/2023   ALKPHOS 85 02/11/2023    AST 22 12/22/2023   ALT 27 12/22/2023   PROT 6.7 12/22/2023   ALBUMIN 4.6 02/11/2023   CALCIUM 9.7 12/22/2023   GFRAA 98 03/24/2020   QFTBGOLDPLUS NEGATIVE 09/29/2021    Speciality Comments: PLQ Eye Exam: 01/24/2023 WNL @ [facility]     Procedures:  Large Joint Inj: R glenohumeral on 01/12/2024 9:22 AM Indications: pain Details: 27 G 1.5 in needle, posterior approach  Arthrogram: No  Medications: 1.5 mL lidocaine  1 %; 40 mg triamcinolone  acetonide 40 MG/ML Aspirate: 0 mL Outcome: tolerated well, no immediate complications  Risk of infection, tendon injury, nerve injury, dermal atrophy and hypopigmentation were discussed. Procedure, treatment alternatives, risks and benefits explained, specific risks discussed. Consent was given by the patient. Immediately prior to procedure a time out was called to verify the correct patient, procedure, equipment, support staff and site/side marked as required. Patient was prepped and draped in the usual sterile fashion.     Allergies: Sulfa antibiotics, Sulfamethoxazole-trimethoprim, Atorvastatin, Codeine, Empagliflozin, Erythromycin, Metronidazole , Morphine and codeine, Semaglutide, and Sitagliptin   Assessment / Plan:     Visit Diagnoses: Rheumatoid arthritis of multiple sites with negative rheumatoid factor (HCC) -patient has been having ongoing pain and stiffness in multiple joints.  She states that she has intermittent swelling.  She complains of discomfort in her cervical spine, right shoulder, left wrist, left hand, left ankle.  She notices intermittent swelling in her left wrist and  left ankle.  She has ongoing swelling in her left ankle joint.  She believes that methotrexate  and Plaquenil  combination is not effective anymore.  Tenderness was noted over some of the joints as described above.  Mild synovitis was also noted on the examination as described above.  An ultrasound examination of bilateral hands was performed which  showed synovitis in bilateral 2nd and 3rd MCP joints.  Plan: Sedimentation rate.  We had a detailed discussion regarding different treatment options and their side effects.  Patient states her sister has been on multiple Biologics and she is familiar with the Biologics.  After discussing indications side effects contraindications we decided to proceed with Enbrel.  A handout was given and consent was taken.  Medication counseling:   TB Test: September 29, 2021 Hepatitis panel: September 29, 2021 SPEP: September 29, 2021 Immunoglobulins: September 29, 2021 Chest x-ray: November 24, 2022 Does patient have diagnosis of heart failure?  No Counseled patient that Enbrel is a TNF blocking agent.  Reviewed Enbrel dose of 50 mg once weekly.  Counseled patient on purpose, proper use, and adverse effects of Enbrel.  Reviewed the most common adverse effects including infections, headache, and injection site reactions. Discussed that there is the possibility of an increased risk of malignancy but it is not well understood if this increased risk is due to the medication or the disease state.  Advised patient to get yearly dermatology exams due to risk of skin cancer.  Reviewed the importance of regular labs while on Enbrel therapy.  Advised patient to get standing labs one month after starting Enbrel then every 2 months.  Provided patient with standing lab orders.  Counseled patient that Enbrel should be held prior to scheduled surgery.  Counseled patient to avoid live vaccines while on Enbrel.  Advised patient to get annual influenza vaccine and the pneumococcal vaccine as needed.  Provided patient with medication education material and answered all questions.  Patient voiced understanding.  Patient consented to Enbrel.  Will upload consent into the media tab.  Reviewed storage instructions for Enbrel.  Advised initial injection must be administered in office.  Patient voiced understanding.     High risk medication use - Methotrexate   0.8 ml sq injections once weekly, folic acid  2 mg daily, Plaquenil  200 mg 1 tablet by mouth twice daily Monday through Friday. -December 22, 2023 CBC and CMP were normal.  TB Gold was negative in August 2023.  Will recheck TB Gold today.  Plan: QuantiFERON-TB Gold Plus today.  She was advised to get labs in 1 month and then every 3 months after starting Enbrel.  Information reimmunization was placed in the AVS.  Annual skin examination to screen for skin cancer was advised.  Use of sunscreen and sun protection was advised.  Pain in both hands - Plan: US  LIMITED JOINT SPACE STRUCTURES UP BILAT  Chronic right shoulder pain -been having severe pain and discomfort in her right shoulder joint for the last 3 weeks.  She complains of nocturnal pain.  She requested a cortisone injection.  Plan: XR Shoulder Right.  X-ray was unremarkable.  After informed consent was obtained right shoulder joint was injected with lidocaine  and cortisone as described above.  Patient tolerated the procedure well.  Postprocedure instructions were given.  A handout on shoulder joint exercise was given.  Primary osteoarthritis of right knee-she good range of motion without discomfort.  Primary osteoarthritis of both feet-mild swelling was noted over the left ankle joint.  She tenderness on  palpation.  Chronic SI joint pain - Unremarkable XR of the pelvis.  Severe multilevel spondylosis and scoliosis of the lumbar spine.  She had good response to SI joint injection in February 2025.  DDD (degenerative disc disease), cervical -he continues to have chronic discomfort.  She had limited range of motion.  MRI of the cervical spine revealed mild cervical spondylosis.  Mild left foraminal narrowing at C3-C4 and C5-C6 noted.  Followed by Dr. Mavis.  Degeneration of intervertebral disc of lumbar region without discogenic back pain or lower extremity pain-neck pain.  Fibromyalgia -need for regular exercise and stretching was discussed.  She  takes baclofen  10 mg daily as needed and tizanidine  4 mg at bedtime as needed for muscle spasms.  Other fatigue-related to fibromyalgia.  Other medical problems listed as follows:  Family history of rheumatoid arthritis-Sister  Family history of psoriatic arthritis-mother and sister  History of depression  History of cellulitis  History of renal calculi  History of PCOS  History of gastroesophageal reflux (GERD)  Mixed hyperlipidemia  History of diabetes mellitus  History of sleep apnea - She has a inspire device.  History of thyroid  nodule  History of migraine  History of tics  Orders: Orders Placed This Encounter  Procedures   Large Joint Inj   US  LIMITED JOINT SPACE STRUCTURES UP BILAT   XR Shoulder Right   QuantiFERON-TB Gold Plus   Sedimentation rate   No orders of the defined types were placed in this encounter.   Face-to-face time spent with patient was over 45 minutes. Greater than 50% of time was spent in counseling and coordination of care.  Follow-Up Instructions: Return in about 3 months (around 04/13/2024) for Rheumatoid arthritis.   Maya Nash, MD  Note - This record has been created using Animal nutritionist.  Chart creation errors have been sought, but may not always  have been located. Such creation errors do not reflect on  the standard of medical care.

## 2024-01-12 NOTE — Patient Instructions (Addendum)
 Etanercept Injection What is this medication? ETANERCEPT (et a [employer]) treats autoimmune conditions, such as psoriasis and certain types of arthritis. It works by slowing down an overactive immune system. It belongs to a group of medications called TNF inhibitors. This medicine may be used for other purposes; ask your health care provider or pharmacist if you have questions. COMMON BRAND NAME(S): Enbrel What should I tell my care team before I take this medication? They need to know if you have any of these conditions: Bleeding disorder Cancer Diabetes Granulomatosis with polyangiitis Heart failure HIV or AIDS Immune system problems Infection, such as tuberculosis (TB) or other bacterial, fungal or viral infections Liver disease Nervous system problems, such as Guillain-Barre syndrome, multiple sclerosis or seizures Recent or upcoming vaccine An unusual or allergic reaction to etanercept, other medications, food, dyes, or preservatives Pregnant or trying to get pregnant Breastfeeding How should I use this medication? The medication is injected under the skin. You will be taught how to prepare and give it. Take it as directed on the prescription label. Keep taking it unless your care team tells you stop. This medication comes with INSTRUCTIONS FOR USE. Ask your pharmacist for directions on how to use this medication. Read the information carefully. Talk to your pharmacist or care team if you have questions. If you use a pen, be sure to take off the outer needle cover before using the dose. It is important that you put your used needles and syringes in a special sharps container. Do not put them in a trash can. If you do not have a sharps container, call your pharmacist or care team to get one. A special MedGuide will be given to you by the pharmacist with each prescription and refill. Be sure to read this information carefully each time. Talk to your care team about the use of  this medication in children. While it may be prescribed for children as young as 51 years of age for selected conditions, precautions do apply. Overdosage: If you think you have taken too much of this medicine contact a poison control center or emergency room at once. NOTE: This medicine is only for you. Do not share this medicine with others. What if I miss a dose? If you miss a dose, take it as soon as you can. If it is almost time for your next dose, take only that dose. Do not take double or extra doses. What may interact with this medication? Do not take this medication with any of the following: Biologic medications, such as adalimumab, certolizumab, golimumab, infliximab Live vaccines Rilonacept This medication may also interact with the following: Abatacept Anakinra Biologic medications, such as anifrolumab, baricitinib, belimumab, canakinumab, natalizumab, rituximab, sarilumab, tocilizumab, tofacitinib, upadacitinib, vedolizumab Cyclophosphamide Sulfasalazine This list may not describe all possible interactions. Give your health care provider a list of all the medicines, herbs, non-prescription drugs, or dietary supplements you use. Also tell them if you smoke, drink alcohol , or use illegal drugs. Some items may interact with your medicine. What should I watch for while using this medication? Visit your care team for regular checks on your progress. Tell your care team if your symptoms do not start to get better or if they get worse. This medication may increase your risk of getting an infection. Call your care team for advice if you get a fever, chills, sore throat, or other symptoms of a cold or flu. Do not treat yourself. Try to avoid being around people who  are sick. If you have not had the measles or chickenpox vaccines, tell your care team right away if you are around someone with these viruses. You will be tested for tuberculosis (TB) before you start this medication. If your care  team prescribes any medication for TB, you should start taking the TB medication before starting this medication. Make sure to finish the full course of TB medication. Avoid taking medications that contain aspirin , acetaminophen , ibuprofen , naproxen, or ketoprofen unless instructed by your care team. These medications may hide fever. Talk to your care team about your risk of cancer. You may be more at risk for certain types of cancer if you take this medication. This medication can decrease the response to a vaccine. If you need to get vaccinated, tell your care team if you have received this medication. Extra booster doses may be needed. Talk to your care team to see if a different vaccination schedule is needed. What side effects may I notice from receiving this medication? Side effects that you should report to your care team as soon as possible: Allergic reactions--skin rash, itching, hives, swelling of the face, lips, tongue, or throat Body pain, tingling, or numbness Eye pain, change in vision, vision loss Heart failure--shortness of breath, swelling of the ankles, feet, or hands, sudden weight gain, unusual weakness or fatigue Infection--fever, chills, cough, sore throat, wounds that don't heal, pain or trouble when passing urine, general feeling of discomfort or being unwell Liver injury--right upper belly pain, loss of appetite, nausea, light-colored stool, dark yellow or brown urine, yellowing skin or eyes, unusual weakness or fatigue Low red blood cell level--unusual weakness or fatigue, dizziness, headache, trouble breathing Lupus-like syndrome--joint pain, swelling, or stiffness, butterfly-shaped rash on the face, rashes that get worse in the sun, fever, unusual weakness or fatigue New or worsening psoriasis--rash with itchy, scaly patches Seizures Unusual bruising or bleeding Weakness in arms and legs Side effects that usually do not require medical attention (report to your care team  if they continue or are bothersome): Headache Pain, redness, or irritation at injection site Sinus pain or pressure around the face or forehead This list may not describe all possible side effects. Call your doctor for medical advice about side effects. You may report side effects to FDA at 1-800-FDA-1088. Where should I keep my medication? Keep out of the reach of children and pets. See product for storage information. Each product may have different instructions. Get rid of any unused medication after the expiration date. To get rid of medications that are no longer needed or have expired: Take the medication to a medication take-back program. Check with your pharmacy or law enforcement to find a location. If you cannot return the medication, ask your pharmacist or care team how to get rid of this medication safely. NOTE: This sheet is a summary. It may not cover all possible information. If you have questions about this medicine, talk to your doctor, pharmacist, or health care provider.  2024 Elsevier/Gold Standard (2021-08-05 00:00:00)  Standing Labs We placed an order today for your standing lab work.   Please have your standing labs drawn in 1 month and then every 3 months  Please have your labs drawn 2 weeks prior to your appointment so that the provider can discuss your lab results at your appointment, if possible.  Please note that you may see your imaging and lab results in MyChart before we have reviewed them. We will contact you once all results are reviewed. Please  allow our office up to 72 hours to thoroughly review all of the results before contacting the office for clarification of your results.  WALK-IN LAB HOURS  Monday through Thursday from 8:00 am - 4:30 pm and Friday from 8:00 am-12:00 pm.  Patients with office visits requiring labs will be seen before walk-in labs.  You may encounter longer than normal wait times. Please allow additional time. Wait times may be  shorter on  Monday and Thursday afternoons.  We do not book appointments for walk-in labs. We appreciate your patience and understanding with our staff.   Labs are drawn by [facility]. Please bring your co-pay at the time of your lab draw.  You may receive a bill from [facility] for your lab work.  Please note if you are on Hydroxychloroquine  and and an order has been placed for a Hydroxychloroquine  level,  you will need to have it drawn 4 hours or more after your last dose.  If you wish to have your labs drawn at another location, please call the office 24 hours in advance so we can fax the orders.  The office is located at [address], [address], [place], [place] [address]   If you have any questions regarding directions or hours of operation,  please call [phone].   As a reminder, please drink plenty of water prior to coming for your lab work. Thanks!   Vaccines You are taking a medication(s) that can suppress your immune system.  The following immunizations are recommended: Flu annually RSV Covid-19  Td/Tdap (tetanus, diphtheria, pertussis) every 10 years Pneumonia (Prevnar 15 then Pneumovax 23 at least 1 year apart.  Alternatively, can take Prevnar 20 without needing additional dose) Shingrix : 2 doses from 4 weeks to 6 months apart  Please check with your PCP to make sure you are up to date.   If you have signs or symptoms of an infection or start antibiotics: First, call your PCP for workup of your infection. Hold your medication through the infection, until you complete your antibiotics, and until symptoms resolve if you take the following: Injectable medication (Actemra, Benlysta, Cimzia, Cosentyx, Enbrel, Humira, Kevzara, Orencia, Remicade, Simponi, Stelara, Taltz, Tremfya) Methotrexate  Leflunomide (Arava) Mycophenolate (Cellcept) Xeljanz, Olumiant, or Rinvoq  Please get an annual skin examination to screen for skin cancer while you are on Enbrel.  Please use sunscreen and  sun protection.

## 2024-01-12 NOTE — Progress Notes (Deleted)
 Office Visit Note  Patient: Christina Lewis             Date of Birth: October 27, 1955           MRN: [id number]             PCP: Severa Rock HERO, FNP Referring: Severa Rock HERO, FNP Visit Date: 01/12/2024 Occupation: Data Unavailable  Subjective:  No chief complaint on file.   History of Present Illness: Christina Lewis is a 68 y.o. female ***     Activities of Daily Living:  Patient reports morning stiffness for *** {minute/hour:19697}.   Patient {ACTIONS;DENIES/REPORTS:21021675::Denies} nocturnal pain.  Difficulty dressing/grooming: {ACTIONS;DENIES/REPORTS:21021675::Denies} Difficulty climbing stairs: {ACTIONS;DENIES/REPORTS:21021675::Denies} Difficulty getting out of chair: {ACTIONS;DENIES/REPORTS:21021675::Denies} Difficulty using hands for taps, buttons, cutlery, and/or writing: {ACTIONS;DENIES/REPORTS:21021675::Denies}  No Rheumatology ROS completed.   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   Vitamin D  deficiency 10/15/2021   Polycystic ovaries 10/15/2021   Migraine without aura and without status migrainosus, not intractable 10/15/2021   BMI 29.0-29.9,adult 10/15/2021   Rheumatoid arthritis (HCC) 03/04/2021   Type 2 diabetes mellitus with obesity 03/04/2021   Right foot infection 03/04/2021   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 myelopathy 09/11/2019   Leukocytosis 11/04/2016   Fibromyalgia 02/10/2016   Other fatigue 02/10/2016   Primary  osteoarthritis of both hands 02/10/2016   History of migraine 02/10/2016   History of thyroid  nodule 02/10/2016   History of sleep apnea 02/10/2016   History of renal calculi 02/10/2016   Pain in joint, shoulder region 10/02/2010   Adhesive capsulitis of shoulder 10/02/2010   Muscle weakness (generalized) 10/02/2010    Past Medical History:  Diagnosis Date   Achilles tendonitis    right foot   Allergy     Anemia    Anxiety    Arthritis 2007   Cataract 2020   Depression 2012   Diabetes mellitus    type 2   Eczema    Fibromyalgia    GERD (gastroesophageal reflux disease) 2008   Gout    no current problem   Hematuria    History of kidney stones    passed stones and also had surgery to remove one   History of tics    Hyperlipidemia 2007   Ineffective esophageal motility    tx with prilosec, essophageal was stretched   Interstitial cystitis 2016   Migraines    Osteoarthritis    PCOS (polycystic ovarian syndrome) 1993   Plantar fasciitis, bilateral    Pneumonia 2019   PONV (postoperative nausea and vomiting)    Recurrent upper respiratory infection (URI)    Sleep apnea 1998   does not use CPAP   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, took medication for a year   Thyroid  nodule    Dr Bella is monitoring every year    Family History  Problem Relation Age of Onset   Fibromyalgia Mother    Psoriasis Mother        psoriatic arthritis    Diabetes Mother  Heart disease Mother    Arthritis Mother    Asthma Mother    Hyperlipidemia Mother    Hypertension Mother    Stroke Mother    Rheum arthritis Sister    Arthritis Sister    Asthma Sister    Diabetes Sister    Hyperlipidemia Sister    Miscarriages / Stillbirths Sister    Psoriasis Sister        psoriatic arthritis   Arthritis Sister    Asthma Sister    Diabetes Sister    Hyperlipidemia Sister    Hypertension Sister    Miscarriages / Stillbirths Sister    Past Surgical History:  Procedure  Laterality Date   ABDOMINAL HYSTERECTOMY  2000   APPENDECTOMY  1976   BALLOON DILATION N/A 11/03/2021   Procedure: MERRILL HODGKIN;  Surgeon: Saintclair Jasper, MD;  Location: [facility];  Service: Gastroenterology;  Laterality: N/A;   BOTOX  INJECTION N/A 11/03/2021   Procedure: BOTOX  INJECTION;  Surgeon: Saintclair Jasper, MD;  Location: [facility];  Service: Gastroenterology;  Laterality: N/A;   BREAST LUMPECTOMY  1989   lt-negative   CARDIOVASCULAR STRESS TEST  2000   CATARACT EXTRACTION W/PHACO Left 12/07/2019   Procedure: CATARACT EXTRACTION PHACO AND INTRAOCULAR LENS PLACEMENT (IOC);  Surgeon: Harrie Agent, MD;  Location: [facility];  Service: Ophthalmology;  Laterality: Left;  CDE: 5.55   CHOLECYSTECTOMY  1985   COLONOSCOPY     DRUG INDUCED ENDOSCOPY Bilateral 09/29/2022   Procedure: DRUG INDUCED ENDOSCOPY;  Surgeon: Okey Burns, MD;  Location: [facility];  Service: ENT;  Laterality: Bilateral;  15 MINUTES   ESOPHAGEAL MANOMETRY N/A 08/24/2021   Procedure: ESOPHAGEAL MANOMETRY (EM);  Surgeon: Saintclair Jasper, MD;  Location: [facility];  Service: Gastroenterology;  Laterality: N/A;   ESOPHAGOGASTRODUODENOSCOPY N/A 11/03/2021   Procedure: ESOPHAGOGASTRODUODENOSCOPY (EGD);  Surgeon: Saintclair Jasper, MD;  Location: [facility];  Service: Gastroenterology;  Laterality: N/A;   EYE SURGERY  2021   HYSTERECTOMY ABDOMINAL WITH SALPINGECTOMY Bilateral 2000   IMPLANTATION OF HYPOGLOSSAL NERVE STIMULATOR Right 11/24/2022   Procedure: IMPLANTATION OF HYPOGLOSSAL NERVE STIMULATOR;  Surgeon: Okey Burns, MD;  Location: [facility];  Service: ENT;  Laterality: Right;   KIDNEY STONE SURGERY Right 2014   with stent placement in OR   KNEE ARTHROSCOPY Right 08/22/2013   Procedure: ARTHROSCOPY RIGHT KNEE FOR INFECTION LAVAGE AND DRAINAGE;  Surgeon: LELON JONETTA Shari Mickey., MD;  Location: [facility];  Service: Orthopedics;  Laterality: Right;   KNEE ARTHROSCOPY WITH PATELLA RECONSTRUCTION  Right 08/06/2013   Procedure: RIGHT KNEE ARTHROSCOPY WITH MENISCECTOMY MEDIAL, ARTHROSCOPY KNEE WITH DEBRIDEMENT/SHAVING (CHONDROPLASTY) ;  Surgeon: LELON JONETTA Shari Mickey., MD;  Location: [facility];  Service: Orthopedics;  Laterality: Right;   LUMBAR LAMINECTOMY/DECOMPRESSION MICRODISCECTOMY Right 09/11/2019   Procedure: Right Lumbar Three-Four Microdiscectomy;  Surgeon: Unice Pac, MD;  [facility];  Service: Neurosurgery;  Laterality: Right;  Right Lumbar Three-Four Microdiscectomy   NASAL SINUS SURGERY     x4   OOPHORECTOMY     SHOULDER SURGERY  2011   right shoulder   SPINE SURGERY  2020   THYROID  SURGERY  1994   TONSILLECTOMY Bilateral 1974   Social History   Tobacco Use   Smoking status: Never    Passive exposure: Past   Smokeless tobacco: Never  Vaping Use   Vaping status: Never Used  Substance Use Topics   Alcohol  use: Never   Drug use: Never   Social History   Social History Narrative  Caffiene 4-5 12 oz cans soda.   Working retired - special ed.   Live with home no kids     Immunization History  Administered Date(s) Administered   Tdap 03/04/2021   Zoster Recombinant(Shingrix ) 10/15/2021     Objective: Vital Signs: There were no vitals taken for this visit.   Physical Exam   Musculoskeletal Exam: ***  CDAI Exam: CDAI Score: -- Patient Global: --; Provider Global: -- Swollen: --; Tender: -- Joint Exam 01/12/2024   No joint exam has been documented for this visit   There is currently no information documented on the homunculus. Go to the Rheumatology activity and complete the homunculus joint exam.  Investigation: No additional findings.  Imaging: No results found.  Recent Labs: Lab Results  Component Value Date   WBC 10.8 12/22/2023   HGB 14.5 12/22/2023   PLT 379 12/22/2023   NA 140 12/22/2023   K 4.5 12/22/2023   CL 106 12/22/2023   CO2 25 12/22/2023   GLUCOSE 110 (H) 12/22/2023   BUN 19 12/22/2023   CREATININE  0.72 12/22/2023   BILITOT 0.3 12/22/2023   ALKPHOS 85 02/11/2023   AST 22 12/22/2023   ALT 27 12/22/2023   PROT 6.7 12/22/2023   ALBUMIN 4.6 02/11/2023   CALCIUM 9.7 12/22/2023   GFRAA 98 03/24/2020   QFTBGOLDPLUS NEGATIVE 09/29/2021    Speciality Comments: PLQ Eye Exam: 01/24/2023 WNL @ [facility]     Procedures:  No procedures performed Allergies: Sulfa antibiotics, Sulfamethoxazole-trimethoprim, Atorvastatin, Codeine, Empagliflozin, Erythromycin, Metronidazole , Morphine and codeine, Semaglutide, and Sitagliptin   Assessment / Plan:     Visit Diagnoses: Primary osteoarthritis of both hands - Plan: US  LIMITED JOINT SPACE STRUCTURES UP BILAT  Chronic right shoulder pain - Plan: XR Shoulder Right  Rheumatoid arthritis of multiple sites with negative rheumatoid factor (HCC)  High risk medication use  Orders: Orders Placed This Encounter  Procedures   US  LIMITED JOINT SPACE STRUCTURES UP BILAT   XR Shoulder Right   No orders of the defined types were placed in this encounter.   Face-to-face time spent with patient was *** minutes. Greater than 50% of time was spent in counseling and coordination of care.  Follow-Up Instructions: No follow-ups on file.   Maya Nash, MD  Note - This record has been created using Animal nutritionist.  Chart creation errors have been sought, but may not always  have been located. Such creation errors do not reflect on  the standard of medical care.

## 2024-01-12 NOTE — Progress Notes (Signed)
 Pharmacy Note  Subjective: Patient presents today to the [facility] for follow up office visit.  Patient seen by the pharmacist for counseling on Enbrel for rheumatoid arthritis (RA).  Diagnosis of heart failure: No  Objective:  CBC    Component Value Date/Time   WBC 10.8 12/22/2023 1007   RBC 4.62 12/22/2023 1007   HGB 14.5 12/22/2023 1007   HGB 14.5 02/11/2023 1035   HCT 43.8 12/22/2023 1007   HCT 45.1 02/11/2023 1035   PLT 379 12/22/2023 1007   PLT 367 02/11/2023 1035   MCV 94.8 12/22/2023 1007   MCV 95 02/11/2023 1035   MCH 31.4 12/22/2023 1007   MCHC 33.1 12/22/2023 1007   RDW 13.6 12/22/2023 1007   RDW 13.8 02/11/2023 1035   LYMPHSABS 3.0 02/11/2023 1035   MONOABS 1.2 (H) 03/05/2021 0412   EOSABS 216 12/22/2023 1007   EOSABS 0.5 (H) 02/11/2023 1035   BASOSABS 97 12/22/2023 1007   BASOSABS 0.1 02/11/2023 1035     CMP     Component Value Date/Time   NA 140 12/22/2023 1007   NA 142 02/11/2023 1035   K 4.5 12/22/2023 1007   CL 106 12/22/2023 1007   CO2 25 12/22/2023 1007   GLUCOSE 110 (H) 12/22/2023 1007   BUN 19 12/22/2023 1007   BUN 19 02/11/2023 1035   CREATININE 0.72 12/22/2023 1007   CALCIUM 9.7 12/22/2023 1007   PROT 6.7 12/22/2023 1007   PROT 6.7 02/11/2023 1035   ALBUMIN 4.6 02/11/2023 1035   AST 22 12/22/2023 1007   ALT 27 12/22/2023 1007   ALKPHOS 85 02/11/2023 1035   BILITOT 0.3 12/22/2023 1007   BILITOT 0.3 02/11/2023 1035   GFRNONAA >60 11/17/2022 1537   GFRNONAA 85 03/24/2020 1527   GFRAA 98 03/24/2020 1527     Baseline Immunosuppressant Therapy Labs TB GOLD    Latest Ref Rng & Units 09/29/2021    4:22 PM  Quantiferon TB Gold  Quantiferon TB Gold Plus NEGATIVE NEGATIVE    Hepatitis Panel    Latest Ref Rng & Units 09/29/2021    4:22 PM  Hepatitis  Hep B Surface Ag NON-REACTIVE NON-REACTIVE   Hep B IgM NON-REACTIVE NON-REACTIVE   Hep C Ab NON-REACTIVE NON-REACTIVE    HIV Lab Results  Component Value Date   HIV Non  Reactive 03/04/2021   Immunoglobulins    Latest Ref Rng & Units 09/29/2021    4:22 PM  Immunoglobulin Electrophoresis  IgA  70 - 320 mg/dL 89   IgG 399 - 8,459 mg/dL 193   IgM 50 - 699 mg/dL 65    SPEP    Latest Ref Rng & Units 12/22/2023   10:07 AM  Serum Protein Electrophoresis  Total Protein 6.1 - 8.1 g/dL 6.7    H3EI Lab Results  Component Value Date   G6PDH 14.4 09/22/2018   TPMT No results found for: TPMT   Chest x-ray: 06/05/2020: IMPRESSION: No active cardiopulmonary disease.  Assessment/Plan:  Counseled patient that Enbrel is a TNF blocking agent.  Counseled patient on purpose, proper use, and adverse effects of Enbrel.  Reviewed the most common adverse effects including infections, headache, and injection site reactions.  Discussed that there is the possibility of an increased risk of malignancy including non-melanoma skin cancer but it is not well understood if this increased risk is due to the medication or the disease state.  Advised patient to get yearly dermatology exams due to risk of skin cancer.  Counseled patient that  Enbrel should be held prior to scheduled surgery.  Counseled patient to avoid live vaccines while on Enbrel.  Recommend annual influenza, PCV 15 or PCV20 or Pneumovax 23, and Shingrix  as indicated.  Reviewed the importance of regular labs while on Enbrel therapy.  Will monitor CBC and CMP 1 month after starting and then every 3 months routinely thereafter. Will monitor TB gold annually. Standing orders placed. Provided patient with medication education material and answered all questions.  Patient consented to Enbrel.  Will upload consent into the media tab.  Reviewed storage instructions for Enbrel.   Patient has Medicare. Reviewed the prescription out-of-pocket maximum of $2100 for calendar year of 2026 including the option to opt into Medicare Prescription Payment Plan (MPPP). Reviewed that patients can enroll at any time and may also disenroll if  needed. Patient would need to call their prescription benefit plan to enroll.  The MPPP allows patients to divide and cap monthly copays for prescriptions drugs. The payment would be made at the beginning of every month after enrollment and would be the amount left to pay towards the maximum divided by the remaining number of months in the year (ex. Enrolling in January would be $2100/12 months vs enrolling in July would be ($2100 minus anything paid towards the max/6 months). Reviewed that amounts paid towards medication /OTCs not processed through insurance (ie. cashed out) do not pay towards the out-of-pocket maximum.  Discussed Enbrel patient assistance program. Patient would like to proceed with program. Obtained signatures and household income information. Will submit application for patient.  Dermatology referral not needed - patient currently seeing a dermatologist.  Jenkins Graces, PharmD PGY1 Pharmacy Resident [phone]

## 2024-01-12 NOTE — Telephone Encounter (Signed)
 Christina Lewis, RPH-CPP  P Rx Rheum/Pulm Please start Enbrel  SureClick BIV. Patient will be starting at home (trained with demo device today) Failed MTX and hydroxychloroquine  due to minimal clinical response. --------------------------------------------------------------  Submitted a Prior Authorization request to [facility] [employer] for ENBREL  via CoverMyMeds. Authorization has been APPROVED from 01/12/2024 to 07/10/2024. Approval letter has not yet been obtained but will be sent to scan center once received.   Test Claim revealed that a 28 day supply has a copay of $0.00.   Patient can fill through [facility]: [phone]     CMM KEY: [id number] Authorization#: [id number]

## 2024-01-13 DIAGNOSIS — R1310 Dysphagia, unspecified: Secondary | ICD-10-CM | POA: Diagnosis not present

## 2024-01-13 NOTE — Telephone Encounter (Signed)
 Rx for Enbrel  pending baseline labs to result

## 2024-01-15 ENCOUNTER — Ambulatory Visit: Payer: Self-pay | Admitting: Rheumatology

## 2024-01-15 LAB — QUANTIFERON-TB GOLD PLUS
Mitogen-NIL: 7.11 [IU]/mL
NIL: 0.01 [IU]/mL
QuantiFERON-TB Gold Plus: NEGATIVE
TB1-NIL: 0 [IU]/mL
TB2-NIL: 0.01 [IU]/mL

## 2024-01-15 LAB — SEDIMENTATION RATE: Sed Rate: 6 mm/h (ref 0–30)

## 2024-01-15 NOTE — Progress Notes (Signed)
 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  Sed rate (inflammatory marker) normal.  Plan to start on Enbrel  once approved.

## 2024-01-16 ENCOUNTER — Telehealth: Payer: Self-pay | Admitting: Adult Health

## 2024-01-16 MED ORDER — ENBREL SURECLICK 50 MG/ML ~~LOC~~ SOAJ: 50.0000 mg | SUBCUTANEOUS | 0 refills | Status: DC

## 2024-01-16 MED ORDER — ENBREL SURECLICK 50 MG/ML ~~LOC~~ SOAJ
50.0000 mg | SUBCUTANEOUS | 1 refills | Status: DC
  Filled 2024-01-18: qty 4, 28d supply, fill #0
  Filled 2024-02-13: qty 4, 28d supply, fill #1

## 2024-01-16 NOTE — Telephone Encounter (Signed)
 Spoke with patient about Enbrel  approval. Advised that rx has been sent to [facility]. She feels confident starting at home (has taken self-administered injectables before). Reviewed holding Enbrel  for active infection.  She is aware that [facility] will reach out to set up shipment ot home.  Information about Enbrel  sent to patient via MyChart.  Sherry Pennant, PharmD, MPH, BCPS, CPP Clinical Pharmacist [facility])

## 2024-01-16 NOTE — Telephone Encounter (Signed)
 LVM and sent mychart msg informing pt of need to reschedule 02/07/24 appt - NP out

## 2024-01-17 MED ORDER — PREDNISONE 5 MG PO TABS: ORAL_TABLET | ORAL | 0 refills | Status: DC

## 2024-01-17 NOTE — Telephone Encounter (Signed)
 Okay to send in prednisone  20 mg tapering by 5 mg every 4 days.  Take prednisone  in the morning with food and avoid the use of NSAIDs.  Watch blood glucose and blood pressure while taking prednisone .

## 2024-01-18 ENCOUNTER — Other Ambulatory Visit: Payer: Self-pay

## 2024-01-18 ENCOUNTER — Encounter: Payer: Self-pay | Admitting: Physical Medicine and Rehabilitation

## 2024-01-18 ENCOUNTER — Encounter: Payer: Self-pay | Admitting: *Deleted

## 2024-01-18 ENCOUNTER — Ambulatory Visit (INDEPENDENT_AMBULATORY_CARE_PROVIDER_SITE_OTHER)

## 2024-01-18 DIAGNOSIS — M0609 Rheumatoid arthritis without rheumatoid factor, multiple sites: Secondary | ICD-10-CM

## 2024-01-18 DIAGNOSIS — E119 Type 2 diabetes mellitus without complications: Secondary | ICD-10-CM | POA: Diagnosis not present

## 2024-01-18 DIAGNOSIS — H04123 Dry eye syndrome of bilateral lacrimal glands: Secondary | ICD-10-CM | POA: Diagnosis not present

## 2024-01-18 DIAGNOSIS — J309 Allergic rhinitis, unspecified: Secondary | ICD-10-CM

## 2024-01-18 DIAGNOSIS — H43812 Vitreous degeneration, left eye: Secondary | ICD-10-CM | POA: Diagnosis not present

## 2024-01-18 DIAGNOSIS — Z79899 Other long term (current) drug therapy: Secondary | ICD-10-CM

## 2024-01-18 DIAGNOSIS — H2511 Age-related nuclear cataract, right eye: Secondary | ICD-10-CM | POA: Diagnosis not present

## 2024-01-18 NOTE — Progress Notes (Signed)
 Specialty Pharmacy Initial Fill Coordination Note  Christina Lewis is a 68 y.o. female contacted today regarding initial fill of specialty medication(s) Etanercept  (Enbrel  SureClick)   Patient requested Delivery   Delivery date: 01/24/24   Verified address: [address]  [place] [address]   Medication will be filled on: 01/23/24   Patient is aware of $0.00 copayment.

## 2024-01-18 NOTE — Progress Notes (Unsigned)
 Patient counseled about Enbrel  at OV on 01/12/2024. Injection information sent to patient via MyChart  Will continue Enbrel  50mg  subcut every 7 days  Sherry Pennant, PharmD, MPH, BCPS, CPP Clinical Pharmacist [facility])

## 2024-01-23 ENCOUNTER — Other Ambulatory Visit: Payer: Self-pay

## 2024-01-23 MED ORDER — METHOTREXATE SODIUM CHEMO INJECTION 50 MG/2ML: 20.0000 mg | INTRAMUSCULAR | 0 refills | Status: AC

## 2024-01-23 NOTE — Telephone Encounter (Signed)
 Last Fill: 11/02/2023  Labs: 12/22/2023 Glucose is mildly elevated, CBC normal, sed rate normal, CRP normal   Next Visit: 04/09/2024  Last Visit: 01/12/2024  DX: Rheumatoid arthritis of multiple sites with negative rheumatoid factor   Current Dose per office note on 01/12/2024: Methotrexate  0.8 ml sq injections once weekly   Okay to refill Methotrexate ?

## 2024-01-24 ENCOUNTER — Ambulatory Visit: Admitting: Rheumatology

## 2024-01-25 ENCOUNTER — Ambulatory Visit

## 2024-01-25 ENCOUNTER — Other Ambulatory Visit: Payer: Self-pay | Admitting: Physician Assistant

## 2024-01-25 DIAGNOSIS — F331 Major depressive disorder, recurrent, moderate: Secondary | ICD-10-CM | POA: Diagnosis not present

## 2024-01-25 DIAGNOSIS — F411 Generalized anxiety disorder: Secondary | ICD-10-CM | POA: Diagnosis not present

## 2024-01-25 DIAGNOSIS — J302 Other seasonal allergic rhinitis: Secondary | ICD-10-CM

## 2024-01-25 DIAGNOSIS — J309 Allergic rhinitis, unspecified: Secondary | ICD-10-CM

## 2024-01-26 NOTE — Telephone Encounter (Signed)
 Last Fill: 12/30/2023  Next Visit: 04/09/2024  Last Visit: 01/12/2024  DX: Fibromyalgia   Current Dose per office note on 01/12/2024: She takes baclofen  10 mg daily as needed and tizanidine  4 mg at bedtime as needed for muscle spasms.   Okay to refill tizanidine  and baclofen ?

## 2024-01-27 ENCOUNTER — Ambulatory Visit

## 2024-01-27 DIAGNOSIS — J302 Other seasonal allergic rhinitis: Secondary | ICD-10-CM | POA: Diagnosis not present

## 2024-01-27 DIAGNOSIS — J309 Allergic rhinitis, unspecified: Secondary | ICD-10-CM

## 2024-01-27 NOTE — H&P (Signed)
 Surgical History & Physical  Patient Name: Christina Lewis  DOB: 09/14/1955  Surgery: Cataract extraction with intraocular lens implant phacoemulsification; Right Eye Surgeon: Marsa Cleverly MD Surgery Date: 02/03/2024 Pre-Op Date: 01/18/2024  HPI: A 66 Yr. old female patient 1. The patient is here today for a Cataract Evaluation. The patient complains of difficulty when driving at night, which began 2 years ago. Both eyes are affected. The episode is gradual. The patient describes foggy and hazy symptoms affecting their eyes/vision. This is negatively affecting the patient's quality of life and the patient is unable to function adequately in life with the current level of vision.  Medical History: Dry Eyes Cataracts  Anxiety, Depression, Hypercholesterolemia, Fibromyalgia, migraines, Tics Arthritis Diabetes - DM Type 2 High Blood Pressure Lung Problems  Review of Systems Negative Allergic/Immunologic Hypertension Cardiovascular Negative Constitutional Negative Ear, Nose, Mouth & Throat Negative Endocrine Negative Eyes Negative Gastrointestinal Negative Genitourinary Negative Hematologic/Lymphatic Negative Integumentary Fibromyalgia Musculoskeletal Migraines Neurological Depression/ Anxiety Psychiatry Asthma Respiratory  Social Never smoked   Medication Pataday , Artifical Tears,  Tumeric, Vitamin D2,  baclofen  ,  tizanidine  ,  folic acid  ,  gabapentin  ,  methylprednisolone  , Plaquenil   Sx/Procedures Phaco c IOL OS Back Surgery   Drug Allergies  Sulfa   History & Physical: Heent: cataract NECK: supple without bruits LUNGS: lungs clear to auscultation CV: regular rate and rhythm Abdomen: soft and non-tender  Impression & Plan: Assessment: 1.  NUCLEAR SCLEROSIS AGE RELATED; , Right Eye (H25.11) 2.  Dry Eye Syndrome; Both Eyes (H04.123) 3.  INTRAOCULAR LENS IOL ; Left Eye (Z96.1) 4.  Diabetes Type 2 No retinopathy (E11.9) 5.  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  BLEPHARITIS; Right Upper Lid, Right Lower Lid, Left Upper Lid, Left Lower Lid (H01.001, H01.002,H01.004,H01.005) 7.  LONG TERM USE OF OTHER MEDICATIONS (Z79.891) 8.  Rheumatoid Arthritis (M06.9) 9.  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  Cataracts are visually significant and account for the patient's complaints. Discussed all risks, benefits, procedures and recovery, including infection, loss of vision and eye, need for glasses after surgery or additional procedures. Patient understands changing glasses will not improve vision. Patient indicated understanding of procedure. All questions answered. Patient desires to have surgery, recommend phacoemulsification with intraocular lens. Patient to have preliminary testing necessary ([employer]/IOL Master, Mac OCT, TOPO) Educational materials provided:Cataract.  Plan: - Proceed with cataract surgery OD when ready - noticing glare and color changes OD which are bothersome - Plan for best distance target with DCB00 to match OS with -2.00 target (please print numbers from 2021 and today) - No DM, no fuchs, no prior eye surgery OD - Had small wound leak OS with surgery - good dilation - Dextenza  if available - reviewed astigmatism may still need to be corrected OD as well  2.  Dry eye. Mild signs of dry eye at this time. Can use artificial tears QID OU PRN and warm compresses once daily as needed.  3.  Doing well since surgery Mild PCO.  4.  Stressed importance of blood sugar and blood pressure control, and also yearly eye examinations. Discussed the need for ongoing proactive ocular exams and treatment, hopefully before visual symptoms develop.  5.  Old RD precautions given. Patient to call with increase in flashing lights/floaters/dark curtain. Symptomatic.  6.  Begin/continue lid scrubs. Continue lid scrubs.  7.  Plaquenil  400 mg daily M to F with weight of 75 kg. Dose of 4.5 mg/kg per day when averaged  over 7 day week, which is well within 5 mg/kg/daily recommendation. No history of breast CA or kidney disease. OCT mac and HVF 10-2 look great today. Will monitor once yearly.  8.  No associated ocular findings on exam. Observe  9.  Early, monitor for now

## 2024-01-31 ENCOUNTER — Inpatient Hospital Stay (HOSPITAL_COMMUNITY): Admission: RE | Admit: 2024-01-31 | Discharge: 2024-01-31 | Attending: Optometry | Admitting: Optometry

## 2024-01-31 ENCOUNTER — Encounter (HOSPITAL_COMMUNITY): Payer: Self-pay

## 2024-01-31 DIAGNOSIS — Z4542 Encounter for adjustment and management of neuropacemaker (brain) (peripheral nerve) (spinal cord): Secondary | ICD-10-CM | POA: Diagnosis not present

## 2024-01-31 DIAGNOSIS — G4733 Obstructive sleep apnea (adult) (pediatric): Secondary | ICD-10-CM | POA: Diagnosis not present

## 2024-02-01 ENCOUNTER — Ambulatory Visit (INDEPENDENT_AMBULATORY_CARE_PROVIDER_SITE_OTHER)

## 2024-02-01 DIAGNOSIS — J302 Other seasonal allergic rhinitis: Secondary | ICD-10-CM

## 2024-02-01 DIAGNOSIS — J309 Allergic rhinitis, unspecified: Secondary | ICD-10-CM

## 2024-02-02 ENCOUNTER — Other Ambulatory Visit: Payer: Self-pay | Admitting: Gastroenterology

## 2024-02-03 ENCOUNTER — Encounter (INDEPENDENT_AMBULATORY_CARE_PROVIDER_SITE_OTHER): Payer: Self-pay

## 2024-02-03 ENCOUNTER — Encounter (HOSPITAL_COMMUNITY): Payer: Self-pay | Admitting: Anesthesiology

## 2024-02-03 ENCOUNTER — Ambulatory Visit

## 2024-02-03 ENCOUNTER — Encounter: Admission: RE | Disposition: A | Payer: Self-pay | Attending: Optometry

## 2024-02-03 ENCOUNTER — Encounter (HOSPITAL_COMMUNITY): Payer: Self-pay | Admitting: Optometry

## 2024-02-03 ENCOUNTER — Ambulatory Visit (HOSPITAL_COMMUNITY): Admission: RE | Admit: 2024-02-03 | Discharge: 2024-02-03 | Disposition: A | Attending: Optometry | Admitting: Optometry

## 2024-02-03 DIAGNOSIS — Z539 Procedure and treatment not carried out, unspecified reason: Secondary | ICD-10-CM | POA: Insufficient documentation

## 2024-02-03 DIAGNOSIS — E1136 Type 2 diabetes mellitus with diabetic cataract: Secondary | ICD-10-CM | POA: Insufficient documentation

## 2024-02-03 DIAGNOSIS — J302 Other seasonal allergic rhinitis: Secondary | ICD-10-CM | POA: Diagnosis not present

## 2024-02-03 DIAGNOSIS — J309 Allergic rhinitis, unspecified: Secondary | ICD-10-CM

## 2024-02-03 DIAGNOSIS — H2511 Age-related nuclear cataract, right eye: Secondary | ICD-10-CM | POA: Insufficient documentation

## 2024-02-03 LAB — GLUCOSE, CAPILLARY: Glucose-Capillary: 134 mg/dL — ABNORMAL HIGH (ref 70–99)

## 2024-02-03 SURGERY — PHACOEMULSIFICATION, CATARACT, WITH IOL INSERTION
Anesthesia: Monitor Anesthesia Care | Laterality: Right

## 2024-02-03 MED ORDER — LACTATED RINGERS IV SOLN: INTRAVENOUS | Status: DC

## 2024-02-03 MED ORDER — LIDOCAINE HCL 3.5 % OP GEL: 1.0000 | Freq: Once | OPHTHALMIC | Status: DC

## 2024-02-03 MED ORDER — PHENYLEPHRINE HCL 2.5 % OP SOLN
1.0000 [drp] | OPHTHALMIC | Status: AC
  Administered 2024-02-03 (×3): 1 [drp] via OPHTHALMIC

## 2024-02-03 MED ORDER — TROPICAMIDE 1 % OP SOLN
1.0000 [drp] | OPHTHALMIC | Status: AC
  Administered 2024-02-03 (×3): 1 [drp] via OPHTHALMIC

## 2024-02-03 MED ORDER — TETRACAINE HCL 0.5 % OP SOLN
1.0000 [drp] | OPHTHALMIC | Status: AC
  Administered 2024-02-03 (×3): 1 [drp] via OPHTHALMIC

## 2024-02-03 NOTE — Progress Notes (Signed)
 Patient had a slimfast drink at 0630 this morning.  Per Dr. Kendell, case is canceled.  Dr. Kendell spoke to patient and explained reason for canceling, Dr. Juli also spoke to patient.   Iv site was d/c, pressure dressing applied.  Patient received 3 sets of the ordered eye drops but no other meds.  Patient d/c home ambulatory with her sister.  Advised against driving or legal decisions for 24 hours since she did get the eye drops.  Patient voiced understanding.

## 2024-02-03 NOTE — Interval H&P Note (Signed)
 History and Physical Interval Note:  02/03/2024 7:16 AM  Christina Lewis  has presented today for surgery, with the diagnosis of nuclear sclerotic cataract, right eye.  The various methods of treatment have been discussed with the patient and family. After consideration of risks, benefits and other options for treatment, the patient has consented to  Procedures: PHACOEMULSIFICATION, CATARACT, WITH IOL INSERTION (Right) INSERTION, STENT, DRUG-ELUTING, LACRIMAL CANALICULUS (Right) as a surgical intervention.  The patient's history has been reviewed, patient examined, no change in status, stable for surgery.  I have reviewed the patient's chart and labs.  Questions were answered to the patient's satisfaction.     [place]

## 2024-02-07 ENCOUNTER — Telehealth: Admitting: Adult Health

## 2024-02-07 LAB — LIPID PANEL
Chol/HDL Ratio: 4.6 ratio — ABNORMAL HIGH (ref 0.0–4.4)
Cholesterol, Total: 268 mg/dL — ABNORMAL HIGH (ref 100–199)
HDL: 58 mg/dL (ref >39)
LDL Chol Calc (NIH): 162 mg/dL — ABNORMAL HIGH (ref 0–99)
Triglycerides: 258 mg/dL — ABNORMAL HIGH (ref 0–149)
VLDL Cholesterol Cal: 48 mg/dL — ABNORMAL HIGH (ref 5–40)

## 2024-02-07 MED ORDER — BD TB SYRINGE 27G X 1/2" 1 ML MISC: 3 refills | Status: AC

## 2024-02-07 NOTE — Addendum Note (Signed)
 Addended by: CENA ALFONSO CROME on: 02/07/2024 09:31 AM   Modules accepted: Orders

## 2024-02-08 ENCOUNTER — Ambulatory Visit

## 2024-02-08 ENCOUNTER — Telehealth: Admitting: Adult Health

## 2024-02-08 ENCOUNTER — Encounter: Payer: Self-pay | Admitting: Allergy & Immunology

## 2024-02-08 ENCOUNTER — Other Ambulatory Visit: Payer: Self-pay

## 2024-02-08 ENCOUNTER — Ambulatory Visit: Admitting: Allergy & Immunology

## 2024-02-08 VITALS — BP 128/76 | HR 85 | Temp 98.2°F | Resp 18 | Ht 64.0 in | Wt 172.4 lb

## 2024-02-08 DIAGNOSIS — J324 Chronic pansinusitis: Secondary | ICD-10-CM | POA: Diagnosis not present

## 2024-02-08 DIAGNOSIS — J3089 Other allergic rhinitis: Secondary | ICD-10-CM

## 2024-02-08 DIAGNOSIS — G43709 Chronic migraine without aura, not intractable, without status migrainosus: Secondary | ICD-10-CM

## 2024-02-08 DIAGNOSIS — B999 Unspecified infectious disease: Secondary | ICD-10-CM | POA: Diagnosis not present

## 2024-02-08 DIAGNOSIS — G43701 Chronic migraine without aura, not intractable, with status migrainosus: Secondary | ICD-10-CM

## 2024-02-08 DIAGNOSIS — J309 Allergic rhinitis, unspecified: Secondary | ICD-10-CM

## 2024-02-08 DIAGNOSIS — J302 Other seasonal allergic rhinitis: Secondary | ICD-10-CM

## 2024-02-08 MED ORDER — NURTEC 75 MG PO TBDP: ORAL_TABLET | ORAL | 11 refills | Status: DC
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
## 2024-02-08 NOTE — Progress Notes (Signed)
 FOLLOW UP  Date of Service/Encounter:  02/08/2024   Assessment:   Seasonal and perennial allergic rhinitis  Chronic migraines  Chronic pansinusitis - getting sinus CT today  Recurrent infections - getting immune workup  Singulair  non-responder  Rheumatoid arthritis - on MTX and Enbrel    Husband passed away in Feb 17, 2023  Plan/Recommendations:   Allergic rhinitis - Continue with allergy  shots at the same schedule.  - It will space out to every 2 weeks once you reach your top dose of the maintenance vial. - Continue carbinoxamine  4 mg tablets 1 to 2 tablets once or twice a day if needed for nasal symptoms - Continue budesonide  nasal rinses up to twice a day if needed for stuffy nose - We are going to order a sinus CT to see if there is anything that needs an ENT evaluation.  - We will also do an immune workup as well.  - We will obtain some screening labs to evaluate your immune system.  - Labs to evaluate the quantitative [facility]) aspects of your immune system: IgG/IgA/IgM, CBC with differential - Labs to evaluate the qualitative (HOW WELL THEY WORK) aspects of your immune system: CH50, Pneumococcal titers, Tetanus titers, Diphtheria titers - We may consider immunizations with Pneumovax and Tdap to challenge your immune system, and then obtain repeat titers in 4-6 weeks.   Migraine Continue follow-up with your neurologist for evaluation and treatment of migraine.  Return in about 3 months (around 05/08/2024). You can have the follow up appointment with Dr. Iva or a Nurse Practicioner (our Nurse Practitioners are excellent and always have Physician oversight!).   Subjective:   Christina Lewis is a 68 y.o. female presenting today for follow up of  Chief Complaint  Patient presents with   Follow-up    Christina Lewis has a history of the following: Patient Active Problem List   Diagnosis Date Noted   Acute cystitis without hematuria 11/29/2023    Hypertriglyceridemia 11/15/2023   DOE (dyspnea on exertion) 11/15/2023   Non-cardiac chest pain 11/15/2023   Chronic rhinosinusitis 10/07/2023   Depression, recurrent 05/26/2023   Chronic migraine w/o aura w/o status migrainosus, not intractable 04/26/2022   Dysphagia 10/15/2021   Hyperlipidemia associated with type 2 diabetes mellitus (HCC) 10/15/2021   Hyperglycemia due to type 2 diabetes mellitus (HCC) 10/15/2021   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   Vitamin D  deficiency 10/15/2021   Polycystic ovaries 10/15/2021   Migraine without aura and without status migrainosus, not intractable 10/15/2021   BMI 29.0-29.9,adult 10/15/2021   Rheumatoid arthritis (HCC) 03/04/2021   Type 2 diabetes mellitus with obesity 03/04/2021   Right foot infection 03/04/2021   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 myelopathy 09/11/2019   Leukocytosis 11/04/2016   Fibromyalgia 02/10/2016   Other fatigue 02/10/2016   Primary osteoarthritis of both hands 02/10/2016   History of migraine 02/10/2016   History of thyroid  nodule 02/10/2016   History of sleep apnea 02/10/2016   History of renal calculi 02/10/2016   Pain in joint, shoulder region 10/02/2010   Adhesive capsulitis of shoulder 10/02/2010   Muscle weakness (generalized) 10/02/2010    History obtained from: chart review and patient.  Discussed the use of AI scribe software for clinical note transcription with the patient and/or guardian, who gave verbal consent to proceed.  Christina Lewis is a 69 y.o. female presenting for a follow up visit.  She was last seen in August 2025 by Arlean Mutter, one of our esteemed nurse practitioners.  At that time, she was  continued on montelukast  as well as carbinoxamine , budesonide  nasal rinses, and ipratropium.  She did decide to start allergen immunotherapy.   Since last visit, she has been about the same.  Allergic Rhinitis Symptom History: She has a long-standing history of sinus issues, which have worsened over the past month. Severe  nasal congestion, described as 'gunky, streamy glue type stuff,' significantly disrupts her sleep. She spends most nights awake, using saline sprays to alleviate the congestion, and takes Benadryl  for temporary relief. Despite these efforts, she wakes up every two hours due to the congestion. She has been on various medications, including montelukast  and carbinoxamine , but reports no significant relief from these treatments. She has also used nasal washes with budesonide , which she had stopped but recently resumed due to worsening symptoms.  She has undergone multiple sinus surgeries, totaling five, with the first surgery occurring at age 41 or 57. 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 Despite these interventions, she continues to suffer from chronic sinus issues.  Sharena is on allergen immunotherapy. She receives one injection. Immunotherapy script #1 contains trees, weeds, grasses, and cat. She currently receives 0.50mL of the GREEN vial (1/1,000). She started shots October of 2025 and not yet reached maintenance.   She has a significant social history of living with a smoker for 47 years, who passed away from lung cancer last year. She has no children but has family support in the area. She is a retired pension scheme manager and has lived in various locations, including [place] and [place].  Her past medical history includes rheumatoid arthritis, for which she takes methotrexate . She also has a history of sleep apnea, which has improved with treatment, as evidenced by a sleep study showing near-normal results.  Otherwise, there have been no changes to her past medical history, surgical history, family history, or social history.    Review of systems otherwise negative other than that mentioned in the HPI.    Objective:   Blood pressure 128/76, pulse 85, temperature 98.2 F (36.8 C), temperature source Temporal, resp. rate 18, height 5' 4  (1.626 m), weight 172 lb 6.4 oz (78.2 kg), SpO2 96%. Body mass index is 29.59 kg/m.    Physical Exam Vitals reviewed.  Constitutional:      Appearance: She is well-developed.  HENT:     Head: Normocephalic and atraumatic.     Right Ear: Tympanic membrane, ear canal and external ear normal.     Left Ear: Tympanic membrane, ear canal and external ear normal.     Nose: Mucosal edema and rhinorrhea present. No nasal deformity or septal deviation.     Right Turbinates: Enlarged, swollen and pale.     Left Turbinates: Enlarged, swollen and pale.     Right Sinus: Maxillary sinus tenderness present. No frontal sinus tenderness.     Left Sinus: Maxillary sinus tenderness present. No frontal sinus tenderness.     Comments: No polpys. Postsurgical changes noted.     Mouth/Throat:     Lips: Pink.     Mouth: Mucous membranes are moist. Mucous membranes are not pale and not dry.     Pharynx: Uvula midline.     Comments: Mild cobblestoning noted.  Eyes:     General: Lids are normal. No allergic shiner.       Right eye: No discharge.        Left eye: No discharge.     Conjunctiva/sclera: Conjunctivae normal.     Right eye: Right  conjunctiva is not injected. No chemosis.    Left eye: Left conjunctiva is not injected. No chemosis.    Pupils: Pupils are equal, round, and reactive to light.  Cardiovascular:     Rate and Rhythm: Normal rate and regular rhythm.     Heart sounds: Normal heart sounds.  Pulmonary:     Effort: Pulmonary effort is normal. No tachypnea, accessory muscle usage or respiratory distress.     Breath sounds: Normal breath sounds. No wheezing, rhonchi or rales.     Comments: Moving air well in all lung fields. No increased work of breathing noted.  Chest:     Chest wall: No tenderness.  Lymphadenopathy:     Cervical: No cervical adenopathy.  Skin:    General: Skin is warm.     Capillary Refill: Capillary refill takes less than 2 seconds.     Coloration: Skin is not pale.      Findings: No abrasion, erythema, petechiae or rash. Rash is not papular, urticarial or vesicular.  Neurological:     Mental Status: She is alert.  Psychiatric:        Behavior: Behavior is cooperative.      Diagnostic studies: none      Marty Shaggy, MD  [facility] 
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
## 2024-02-08 NOTE — Patient Instructions (Signed)
 Continue Vyepti  Continue Nurtec

## 2024-02-08 NOTE — Patient Instructions (Addendum)
 Allergic rhinitis - Continue with allergy  shots at the same schedule.  - It will space out to every 2 weeks once you reach your top dose of the maintenance vial. - Continue carbinoxamine  4 mg tablets 1 to 2 tablets once or twice a day if needed for nasal symptoms - Continue budesonide  nasal rinses up to twice a day if needed for stuffy nose - We are going to order a sinus CT to see if there is anything that needs an ENT evaluation.  - We will also do an immune workup as well.  - We will obtain some screening labs to evaluate your immune system.  - Labs to evaluate the quantitative [facility]) aspects of your immune system: IgG/IgA/IgM, CBC with differential - Labs to evaluate the qualitative (HOW WELL THEY WORK) aspects of your immune system: CH50, Pneumococcal titers, Tetanus titers, Diphtheria titers - We may consider immunizations with Pneumovax and Tdap to challenge your immune system, and then obtain repeat titers in 4-6 weeks.   Migraine Continue follow-up with your neurologist for evaluation and treatment of migraine.  Return in about 3 months (around 05/08/2024). You can have the follow up appointment with Dr. Iva or a Nurse Practicioner (our Nurse Practitioners are excellent and always have Physician oversight!).    Please inform us  of any Emergency Department visits, hospitalizations, or changes in symptoms. Call us  before going to the ED for breathing or allergy  symptoms since we might be able to fit you in for a sick visit. Feel free to contact us  anytime with any questions, problems, or concerns.  It was a pleasure to meet you today!  Websites that have reliable patient information: 1. American Academy of Asthma, Allergy , and Immunology: www.aaaai.org 2. Food Allergy  Research and Education (FARE): foodallergy.org 3. Mothers of Asthmatics: http://www.asthmacommunitynetwork.org 4. American College of Allergy , Asthma, and Immunology: www.acaai.org      Like us  on  [employer] and Instagram for our latest updates!      A healthy democracy works best when [employer] participate! Make sure you are registered to vote! If you have moved or changed any of your contact information, you will need to get this updated before voting! Scan the QR codes below to learn more!

## 2024-02-09 ENCOUNTER — Telehealth: Payer: Self-pay

## 2024-02-09 ENCOUNTER — Other Ambulatory Visit (HOSPITAL_COMMUNITY): Payer: Self-pay

## 2024-02-09 NOTE — Progress Notes (Signed)
 I have faxed the insurance the required form and progress notes.

## 2024-02-09 NOTE — Telephone Encounter (Signed)
 Dr. Iva ordered a CT Maxillofacial wo Contrast. I have faxed [employer] @ [phone] with the prior auth form and requested progress notes for the need of imaging.

## 2024-02-10 ENCOUNTER — Ambulatory Visit (INDEPENDENT_AMBULATORY_CARE_PROVIDER_SITE_OTHER)

## 2024-02-10 DIAGNOSIS — J302 Other seasonal allergic rhinitis: Secondary | ICD-10-CM

## 2024-02-10 DIAGNOSIS — J309 Allergic rhinitis, unspecified: Secondary | ICD-10-CM

## 2024-02-13 ENCOUNTER — Other Ambulatory Visit: Payer: Self-pay

## 2024-02-14 ENCOUNTER — Other Ambulatory Visit: Payer: Self-pay

## 2024-02-14 ENCOUNTER — Ambulatory Visit: Admitting: Internal Medicine

## 2024-02-14 ENCOUNTER — Encounter: Payer: Medicare PPO | Admitting: Family Medicine

## 2024-02-14 ENCOUNTER — Encounter (HOSPITAL_COMMUNITY): Payer: Self-pay | Admitting: Gastroenterology

## 2024-02-14 NOTE — Progress Notes (Signed)
 Specialty Pharmacy Refill Coordination Note  Christina Lewis is a 68 y.o. female contacted today regarding refills of specialty medication(s) Etanercept  (Enbrel  SureClick)   Patient requested Delivery   Delivery date: 02/21/24   Verified address: [address] [place] [address]   Medication will be filled on: 02/20/24

## 2024-02-14 NOTE — Progress Notes (Addendum)
 Anesthesia Review:  PCP: Cardiologist :  PPM/ ICD: Device Orders: Rep Notified:  Chest x-ray : EKG : 11/10/23  Echo : 11/30/23  CT Card- 09/02/23  PFT- 01/02/24  Stress test: Cardiac Cath :   Activity level:  Sleep Study/ CPAP : Fasting Blood Sugar :      / Checks Blood Sugar -- times a day:     DM- type  Hgba1c-   Blood Thinner/ Instructions /Last Dose: ASA / Instructions/ Last Dose :    02/08/24- cbc with diff  12/22/23- CMP    Inspire device for sleep apnea pt to bring remote day of procedure.

## 2024-02-15 ENCOUNTER — Other Ambulatory Visit (HOSPITAL_COMMUNITY): Payer: Self-pay

## 2024-02-15 ENCOUNTER — Other Ambulatory Visit: Payer: Self-pay | Admitting: Physician Assistant

## 2024-02-15 DIAGNOSIS — M0609 Rheumatoid arthritis without rheumatoid factor, multiple sites: Secondary | ICD-10-CM

## 2024-02-17 ENCOUNTER — Ambulatory Visit: Payer: Self-pay | Admitting: Family Medicine

## 2024-02-17 ENCOUNTER — Encounter: Payer: Self-pay | Admitting: Family Medicine

## 2024-02-17 ENCOUNTER — Encounter: Payer: Self-pay | Admitting: Adult Health

## 2024-02-17 VITALS — BP 122/75 | HR 78 | Temp 97.5°F | Ht 64.0 in | Wt 173.0 lb

## 2024-02-17 DIAGNOSIS — E1169 Type 2 diabetes mellitus with other specified complication: Secondary | ICD-10-CM | POA: Diagnosis not present

## 2024-02-17 DIAGNOSIS — M0609 Rheumatoid arthritis without rheumatoid factor, multiple sites: Secondary | ICD-10-CM | POA: Diagnosis not present

## 2024-02-17 DIAGNOSIS — E785 Hyperlipidemia, unspecified: Secondary | ICD-10-CM

## 2024-02-17 DIAGNOSIS — E1142 Type 2 diabetes mellitus with diabetic polyneuropathy: Secondary | ICD-10-CM | POA: Diagnosis not present

## 2024-02-17 DIAGNOSIS — Z0001 Encounter for general adult medical examination with abnormal findings: Secondary | ICD-10-CM | POA: Diagnosis not present

## 2024-02-17 DIAGNOSIS — H9193 Unspecified hearing loss, bilateral: Secondary | ICD-10-CM

## 2024-02-17 DIAGNOSIS — E559 Vitamin D deficiency, unspecified: Secondary | ICD-10-CM | POA: Diagnosis not present

## 2024-02-17 DIAGNOSIS — G4733 Obstructive sleep apnea (adult) (pediatric): Secondary | ICD-10-CM | POA: Diagnosis not present

## 2024-02-17 DIAGNOSIS — Z0000 Encounter for general adult medical examination without abnormal findings: Secondary | ICD-10-CM

## 2024-02-17 LAB — COMPLEMENT, TOTAL: Compl, Total (CH50): >60 U/mL

## 2024-02-17 LAB — CBC WITH DIFF/PLATELET
Basophils Absolute: 0.1 x10E3/uL (ref 0.0–0.2)
Basos: 1 %
EOS (ABSOLUTE): 0.6 x10E3/uL — ABNORMAL HIGH (ref 0.0–0.4)
Eos: 6 %
Hematocrit: 41.8 % (ref 34.0–46.6)
Hemoglobin: 13.7 g/dL (ref 11.1–15.9)
Immature Grans (Abs): 0.2 x10E3/uL — ABNORMAL HIGH (ref 0.0–0.1)
Immature Granulocytes: 2 %
Lymphocytes Absolute: 2.3 x10E3/uL (ref 0.7–3.1)
Lymphs: 22 %
MCH: 31.1 pg (ref 26.6–33.0)
MCHC: 32.8 g/dL (ref 31.5–35.7)
MCV: 95 fL (ref 79–97)
Monocytes Absolute: 0.9 x10E3/uL (ref 0.1–0.9)
Monocytes: 9 %
Neutrophils Absolute: 6 x10E3/uL (ref 1.4–7.0)
Neutrophils: 60 %
Platelets: 316 x10E3/uL (ref 150–450)
RBC: 4.4 x10E6/uL (ref 3.77–5.28)
RDW: 14.4 % (ref 11.7–15.4)
WBC: 10.1 x10E3/uL (ref 3.4–10.8)

## 2024-02-17 LAB — STREP PNEUMONIAE 23 SEROTYPES IGG
Pneumo Ab Type 1*: 0.6 ug/mL — ABNORMAL LOW
Pneumo Ab Type 12 (12F)*: 0.1 ug/mL — ABNORMAL LOW
Pneumo Ab Type 14*: 0.7 ug/mL — ABNORMAL LOW
Pneumo Ab Type 17 (17F)*: 5.2 ug/mL
Pneumo Ab Type 19 (19F)*: 0.5 ug/mL — ABNORMAL LOW
Pneumo Ab Type 2*: 2.8 ug/mL
Pneumo Ab Type 20*: 2.5 ug/mL
Pneumo Ab Type 22 (22F)*: 0.7 ug/mL — ABNORMAL LOW
Pneumo Ab Type 23 (23F)*: 0.5 ug/mL — ABNORMAL LOW
Pneumo Ab Type 26 (6B)*: 0.3 ug/mL — ABNORMAL LOW
Pneumo Ab Type 3*: 0.1 ug/mL — ABNORMAL LOW
Pneumo Ab Type 34 (10A)*: 2.6 ug/mL
Pneumo Ab Type 4*: 0.7 ug/mL — ABNORMAL LOW
Pneumo Ab Type 43 (11A)*: 1.3 ug/mL — ABNORMAL LOW
Pneumo Ab Type 5*: 0.4 ug/mL — ABNORMAL LOW
Pneumo Ab Type 51 (7F)*: >13.2 ug/mL
Pneumo Ab Type 54 (15B)*: 0.6 ug/mL — ABNORMAL LOW
Pneumo Ab Type 56 (18C)*: 3.4 ug/mL
Pneumo Ab Type 57 (19A)*: 8.3 ug/mL
Pneumo Ab Type 68 (9V)*: 0.6 ug/mL — ABNORMAL LOW
Pneumo Ab Type 70 (33F)*: 0.9 ug/mL — ABNORMAL LOW
Pneumo Ab Type 8*: 0.2 ug/mL — ABNORMAL LOW
Pneumo Ab Type 9 (9N)*: 0.3 ug/mL — ABNORMAL LOW

## 2024-02-17 LAB — IGG, IGA, IGM
IgG (Immunoglobin G), Serum: 674 mg/dL (ref 586–1602)
IgM (Immunoglobulin M), Srm: 51 mg/dL (ref 26–217)
Immunoglobulin A, (IgA) QN, Serum: 78 mg/dL — ABNORMAL LOW (ref 87–352)

## 2024-02-17 LAB — DIPHTHERIA / TETANUS ANTIBODY PANEL
Diphtheria Ab: 1.26 [IU]/mL
Tetanus Ab, IgG: 5.74 [IU]/mL

## 2024-02-17 LAB — LIPID PANEL

## 2024-02-17 LAB — BAYER DCA HB A1C WAIVED: HB A1C (BAYER DCA - WAIVED): 6.4 % — ABNORMAL HIGH (ref 4.8–5.6)

## 2024-02-17 NOTE — Telephone Encounter (Signed)
 Patient may use ice to the injection site prior to the injection.  She may use  over-the-counter topical hydrocortisone cream at the injection site after the injection.  If the redness at the injection site increases in size and spreads to the other area then we will have to discontinue Enbrel .

## 2024-02-17 NOTE — Progress Notes (Signed)
 "  Complete physical exam  Patient: Christina Lewis   DOB: Jan 03, 1956   68 y.o. Female  MRN: [id number]  Subjective:    Chief Complaint  Patient presents with   Annual Exam    [name] is a 68 y.o. female who presents today for a complete physical exam. She reports consuming a general diet. The patient does not participate in regular exercise at present. She generally feels well. She reports sleeping well. She does have additional problems to discuss today.    Her rheumatoid arthritis has been worsening, leading to a change in her medication regimen. She has been started on Enbrel  and has completed her fourth injection. She experienced a mild reaction with bruising and a delayed onset of symptoms after her last injection, but it is currently resolving. She continues to take methotrexate  and Plaquenil  for her RA.  Her diabetes has been mostly diet-controlled, with her last A1c being 6.7. She has experienced adverse reactions to Ozempic, including severe gastrointestinal symptoms. There is a family history of diabetes, with her mother and both sisters being affected.  She has a history of a small lung nodule, which is being monitored with CT scans. She underwent immune testing due to this finding and her use of methotrexate .  She has experienced difficulty swallowing and previously underwent an endoscopy to stretch her esophagus and administer Botox , which provided relief for several months.  She reports numbness and tingling in her feet, which she discovered after a minor injury went unnoticed. She is currently on gabapentin , which was restarted by her neurologist to help with headaches and neuropathy.  She has sleep apnea and uses an Inspire device, which she reports is working well. She also mentions a history of sinus issues and a previous hearing test indicating age-related hearing loss, with a recommendation for repeat testing.       Most recent fall risk assessment:     02/17/2024    2:29 PM  Fall Risk   Falls in the past year? 0  Risk for fall due to : No Fall Risks  Follow up Falls evaluation completed     Most recent depression screenings:    02/17/2024    2:29 PM 11/10/2023   12:01 PM  PHQ 2/9 Scores  PHQ - 2 Score 4 2  PHQ- 9 Score 14 10      Data saved with a previous flowsheet row definition    Vision:Within last year and Dental: No current dental problems  Patient Active Problem List   Diagnosis Date Noted   Obstructive sleep apnea syndrome 02/17/2024   Diabetic peripheral neuropathy associated with type 2 diabetes mellitus (HCC) 02/17/2024   Decreased hearing of both ears 02/17/2024   Acute cystitis without hematuria 11/29/2023   DOE (dyspnea on exertion) 11/15/2023   Non-cardiac chest pain 11/15/2023   Chronic rhinosinusitis 10/07/2023   Depression, recurrent 05/26/2023   Acquired cavovarus deformity of right foot 04/25/2023   Plantar fasciitis 03/11/2023   Chronic migraine w/o aura w/o status migrainosus, not intractable 04/26/2022   Dysphagia 10/15/2021   Hyperlipidemia associated with type 2 diabetes mellitus (HCC) 10/15/2021   Hyperglycemia due to type 2 diabetes mellitus (HCC) 10/15/2021   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   Vitamin D  deficiency 10/15/2021   Polycystic ovaries 10/15/2021   Migraine without aura and without status migrainosus, not intractable 10/15/2021   BMI 29.0-29.9,adult 10/15/2021   Rheumatoid arthritis (HCC) 03/04/2021   Diabetes mellitus (HCC) 03/04/2021   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 myelopathy  09/11/2019   Leukocytosis 11/04/2016   Fibromyalgia 02/10/2016   Other fatigue 02/10/2016   Primary osteoarthritis of both hands 02/10/2016   History of migraine 02/10/2016   History of thyroid  nodule 02/10/2016   History of sleep apnea 02/10/2016   History of renal calculi 02/10/2016   Pain in joint, shoulder region 10/02/2010   Adhesive capsulitis of shoulder 10/02/2010   Muscle weakness (generalized)  10/02/2010   Past Medical History:  Diagnosis Date   Achilles tendonitis    right foot   Allergy     Anxiety    Arthritis 2007   Cataract 2020   Depression 2012   Eczema    Fibromyalgia    GERD (gastroesophageal reflux disease) 2008   Gout    no current problem   Hematuria    History of kidney stones    passed stones and also had surgery to remove one   History of tics    Hyperlipidemia 2007   Ineffective esophageal motility    tx with prilosec, essophageal was stretched   Interstitial cystitis 2016   Migraines    Osteoarthritis    PCOS (polycystic ovarian syndrome) 1993   Plantar fasciitis, bilateral    Pneumonia 2019   PONV (postoperative nausea and vomiting)    Pre-diabetes    Recurrent upper respiratory infection (URI)    Sleep apnea 1998   inspire device   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, took medication for a year   Thyroid  nodule    Dr Bella is monitoring every year   Type 2 diabetes mellitus in patient with obesity (HCC) 03/04/2021    IMO SNOMED Dx Update Oct 2024   Past Surgical History:  Procedure Laterality Date   ABDOMINAL HYSTERECTOMY  2000   APPENDECTOMY  1976   BALLOON DILATION N/A 11/03/2021   Procedure: MERRILL DILATION;  Surgeon: Saintclair Jasper, MD;  Location: [facility];  Service: Gastroenterology;  Laterality: N/A;   BOTOX  INJECTION N/A 11/03/2021   Procedure: BOTOX  INJECTION;  Surgeon: Saintclair Jasper, MD;  Location: [facility];  Service: Gastroenterology;  Laterality: N/A;   BREAST LUMPECTOMY  1989   lt-negative   CARDIOVASCULAR STRESS TEST  2000   CATARACT EXTRACTION W/PHACO Left 12/07/2019   Procedure: CATARACT EXTRACTION PHACO AND INTRAOCULAR LENS PLACEMENT (IOC);  Surgeon: Harrie Agent, MD;  Location: [facility];  Service: Ophthalmology;  Laterality: Left;  CDE: 5.55   CHOLECYSTECTOMY  1985   COLONOSCOPY     DRUG INDUCED ENDOSCOPY Bilateral 09/29/2022   Procedure: DRUG INDUCED ENDOSCOPY;  Surgeon: Okey Burns,  MD;  Location: [facility];  Service: ENT;  Laterality: Bilateral;  15 MINUTES   ESOPHAGEAL MANOMETRY N/A 08/24/2021   Procedure: ESOPHAGEAL MANOMETRY (EM);  Surgeon: Saintclair Jasper, MD;  Location: [facility];  Service: Gastroenterology;  Laterality: N/A;   ESOPHAGOGASTRODUODENOSCOPY N/A 11/03/2021   Procedure: ESOPHAGOGASTRODUODENOSCOPY (EGD);  Surgeon: Saintclair Jasper, MD;  Location: [facility];  Service: Gastroenterology;  Laterality: N/A;   EYE SURGERY  2021   HYSTERECTOMY ABDOMINAL WITH SALPINGECTOMY Bilateral 2000   IMPLANTATION OF HYPOGLOSSAL NERVE STIMULATOR Right 11/24/2022   Procedure: IMPLANTATION OF HYPOGLOSSAL NERVE STIMULATOR;  Surgeon: Okey Burns, MD;  Location: [facility];  Service: ENT;  Laterality: Right;   KIDNEY STONE SURGERY Right 2014   with stent placement in OR   KNEE ARTHROSCOPY Right 08/22/2013   Procedure: ARTHROSCOPY RIGHT KNEE FOR INFECTION LAVAGE AND DRAINAGE;  Surgeon: LELON JONETTA Shari Mickey., MD;  Location: [facility];  Service:  Orthopedics;  Laterality: Right;   KNEE ARTHROSCOPY WITH PATELLA RECONSTRUCTION Right 08/06/2013   Procedure: RIGHT KNEE ARTHROSCOPY WITH MENISCECTOMY MEDIAL, ARTHROSCOPY KNEE WITH DEBRIDEMENT/SHAVING (CHONDROPLASTY) ;  Surgeon: LELON JONETTA Shari Mickey., MD;  Location: [facility];  Service: Orthopedics;  Laterality: Right;   LUMBAR LAMINECTOMY/DECOMPRESSION MICRODISCECTOMY Right 09/11/2019   Procedure: Right Lumbar Three-Four Microdiscectomy;  Surgeon: Unice Pac, MD;  Location[facility];  Service: Neurosurgery;  Laterality: Right;  Right Lumbar Three-Four Microdiscectomy   NASAL SINUS SURGERY     x4   OOPHORECTOMY     SHOULDER SURGERY  2011   right shoulder   SPINE SURGERY  2020   THYROID  SURGERY  1994   TONSILLECTOMY Bilateral 1974   Social History[1] Social History   Socioeconomic History   Marital status: Widowed    Spouse name: Elsie   Number of children: Not on file   Years of education: Not on  file   Highest education level: Bachelor's degree (e.g., BA, AB, BS)  Occupational History   Occupation: retired  Tobacco Use   Smoking status: Never    Passive exposure: Past   Smokeless tobacco: Never  Vaping Use   Vaping status: Never Used  Substance and Sexual Activity   Alcohol  use: Never   Drug use: Never   Sexual activity: Not Currently    Birth control/protection: Post-menopausal    Comment: Hysterectomy  Other Topics Concern   Not on file  Social History Narrative   Caffiene 4-5 12 oz cans soda.   Working retired - special ed.   Live with home no kids   Social Drivers of Health   Tobacco Use: Low Risk (02/17/2024)   Patient History    Smoking Tobacco Use: Never    Smokeless Tobacco Use: Never    Passive Exposure: Past  Financial Resource Strain: Low Risk (12/06/2023)   Overall Financial Resource Strain (CARDIA)    Difficulty of Paying Living Expenses: Not hard at all  Food Insecurity: No Food Insecurity (12/06/2023)   Epic    Worried About Programme Researcher, Broadcasting/film/video in the Last Year: Never true    Ran Out of Food in the Last Year: Never true  Transportation Needs: No Transportation Needs (12/06/2023)   Epic    Lack of Transportation (Medical): No    Lack of Transportation (Non-Medical): No  Physical Activity: Inactive (12/06/2023)   Exercise Vital Sign    Days of Exercise per Week: 0 days    Minutes of Exercise per Session: Not on file  Stress: Stress Concern Present (12/06/2023)   [employer] of Occupational Health - Occupational Stress Questionnaire    Feeling of Stress: To some extent  Social Connections: Moderately Isolated (12/06/2023)   Social Connection and Isolation Panel    Frequency of Communication with Friends and Family: More than three times a week    Frequency of Social Gatherings with Friends and Family: More than three times a week    Attends Religious Services: 1 to 4 times per year    Active Member of [employer] or Organizations: No     Attends Banker Meetings: Not on file    Marital Status: Widowed  Intimate Partner Violence: Not At Risk (02/18/2023)   Humiliation, Afraid, Rape, and Kick questionnaire    Fear of Current or Ex-Partner: No    Emotionally Abused: No    Physically Abused: No    Sexually Abused: No  Depression (PHQ2-9): High Risk (02/17/2024)   Depression (PHQ2-9)  PHQ-2 Score: 14  Alcohol  Screen: Low Risk (02/18/2023)   Alcohol  Screen    Last Alcohol  Screening Score (AUDIT): 0  Housing: Low Risk (12/06/2023)   Epic    Unable to Pay for Housing in the Last Year: No    Number of Times Moved in the Last Year: 0    Homeless in the Last Year: No  Utilities: Not At Risk (02/18/2023)   [employer] Utilities    Threatened with loss of utilities: No  Health Literacy: Adequate Health Literacy (02/18/2023)   B1300 Health Literacy    Frequency of need for help with medical instructions: Never   Family Status  Relation Name Status   Mother [place] Deceased   Father  Deceased       sepsis    Sister Garment/textile Technologist Alive   Sister [place] Alive   Brother  Alive   Sister  Alive  No partnership data on file   Family History  Problem Relation Age of Onset   Fibromyalgia Mother    Psoriasis Mother        psoriatic arthritis    Diabetes Mother    Heart disease Mother    Arthritis Mother    Asthma Mother    Hyperlipidemia Mother    Hypertension Mother    Stroke Mother    Rheum arthritis Sister    Arthritis Sister    Asthma Sister    Diabetes Sister    Hyperlipidemia Sister    Miscarriages / Stillbirths Sister    Psoriasis Sister        psoriatic arthritis   Arthritis Sister    Asthma Sister    Diabetes Sister    Hyperlipidemia Sister    Hypertension Sister    Miscarriages / Stillbirths Sister    Allergies[2]    Patient Care Team: Kaylene Dawn, Rock HERO, FNP as PCP - General (Family Medicine) Stacia Diannah SQUIBB, MD as PCP - Cardiology (Cardiology) Gladis Gearing, OD (Optometry)   Show/hide  medication list[3]  ROS per HPI      Objective:     BP 122/75   Pulse 78   Temp (!) 97.5 F (36.4 C)   Ht 5' 4 (1.626 m)   Wt 173 lb (78.5 kg)   SpO2 96%   BMI 29.70 kg/m  BP Readings from Last 3 Encounters:  02/17/24 122/75  02/08/24 128/76  02/03/24 134/88   Wt Readings from Last 3 Encounters:  02/17/24 173 lb (78.5 kg)  02/08/24 172 lb 6.4 oz (78.2 kg)  02/03/24 170 lb (77.1 kg)   SpO2 Readings from Last 3 Encounters:  02/17/24 96%  02/08/24 96%  02/03/24 98%      Physical Exam Vitals and nursing note reviewed.  Constitutional:      General: She is not in acute distress.    Appearance: Normal appearance. She is well-developed and well-groomed. She is obese. She is not ill-appearing, toxic-appearing or diaphoretic.  HENT:     Head: Normocephalic and atraumatic.     Jaw: There is normal jaw occlusion.     Right Ear: Hearing, tympanic membrane, ear canal and external ear normal.     Left Ear: Hearing, tympanic membrane, ear canal and external ear normal.     Nose: Nose normal.     Mouth/Throat:     Lips: Pink.     Mouth: Mucous membranes are moist.     Pharynx: Oropharynx is clear. Uvula midline.  Eyes:     General: Lids are normal.  Extraocular Movements: Extraocular movements intact.     Conjunctiva/sclera: Conjunctivae normal.     Pupils: Pupils are equal, round, and reactive to light.  Neck:     Thyroid : No thyroid  mass, thyromegaly or thyroid  tenderness.     Vascular: No carotid bruit or JVD.     Trachea: Trachea and phonation normal.  Cardiovascular:     Rate and Rhythm: Normal rate and regular rhythm.     Chest Wall: PMI is not displaced.     Pulses: Normal pulses.          Dorsalis pedis pulses are 2+ on the right side and 2+ on the left side.       Posterior tibial pulses are 2+ on the right side and 2+ on the left side.     Heart sounds: Normal heart sounds. No murmur heard.    No friction rub. No gallop.  Pulmonary:     Effort:  Pulmonary effort is normal. No respiratory distress.     Breath sounds: Normal breath sounds. No wheezing.  Abdominal:     General: Bowel sounds are normal. There is no distension or abdominal bruit.     Palpations: Abdomen is soft. There is no hepatomegaly or splenomegaly.     Tenderness: There is no abdominal tenderness. There is no right CVA tenderness or left CVA tenderness.     Hernia: No hernia is present.  Musculoskeletal:        General: Normal range of motion.     Cervical back: Normal range of motion and neck supple.     Right lower leg: No edema.     Left lower leg: No edema.     Right foot: Normal range of motion. No deformity, bunion, Charcot foot, foot drop or prominent metatarsal heads.     Left foot: Normal range of motion. No deformity, bunion, Charcot foot, foot drop or prominent metatarsal heads.  Feet:     Right foot:     Protective Sensation: 10 sites tested.  6 sites sensed.     Skin integrity: Skin breakdown (abrasion to bottom of right great toe) present.     Toenail Condition: Right toenails are normal.     Left foot:     Protective Sensation: 10 sites tested.  6 sites sensed.     Skin integrity: Skin integrity normal.     Toenail Condition: Left toenails are normal.  Lymphadenopathy:     Cervical: No cervical adenopathy.  Skin:    General: Skin is warm and dry.     Capillary Refill: Capillary refill takes less than 2 seconds.     Coloration: Skin is not cyanotic, jaundiced or pale.     Findings: No rash.  Neurological:     General: No focal deficit present.     Mental Status: She is alert and oriented to person, place, and time.     Sensory: Sensation is intact.     Motor: Tremor present.     Coordination: Coordination is intact.     Gait: Gait is intact.     Deep Tendon Reflexes: Reflexes are normal and symmetric.  Psychiatric:        Attention and Perception: Attention and perception normal.        Mood and Affect: Mood and affect normal.         Speech: Speech normal.        Behavior: Behavior normal. Behavior is cooperative.        Thought Content: Thought  content normal.        Cognition and Memory: Cognition and memory normal.        Judgment: Judgment normal.       Last CBC Lab Results  Component Value Date   WBC 10.1 02/08/2024   HGB 13.7 02/08/2024   HCT 41.8 02/08/2024   MCV 95 02/08/2024   MCH 31.1 02/08/2024   RDW 14.4 02/08/2024   PLT 316 02/08/2024   Last metabolic panel Lab Results  Component Value Date   GLUCOSE 110 (H) 12/22/2023   NA 140 12/22/2023   K 4.5 12/22/2023   CL 106 12/22/2023   CO2 25 12/22/2023   BUN 19 12/22/2023   CREATININE 0.72 12/22/2023   EGFR 92 12/22/2023   CALCIUM 9.7 12/22/2023   PROT 6.7 12/22/2023   ALBUMIN 4.6 02/11/2023   LABGLOB 2.1 02/11/2023   AGRATIO 2.0 10/15/2021   BILITOT 0.3 12/22/2023   ALKPHOS 85 02/11/2023   AST 22 12/22/2023   ALT 27 12/22/2023   ANIONGAP 9 11/17/2022   Last lipids Lab Results  Component Value Date   CHOL 268 (H) 02/06/2024   HDL 58 02/06/2024   LDLCALC 162 (H) 02/06/2024   TRIG 258 (H) 02/06/2024   CHOLHDL 4.6 (H) 02/06/2024   Last hemoglobin A1c Lab Results  Component Value Date   HGBA1C 5.9 (H) 05/12/2023   Last thyroid  functions Lab Results  Component Value Date   TSH 0.762 02/11/2023   T4TOTAL 4.6 02/11/2023   Last vitamin D  Lab Results  Component Value Date   VD25OH 59.2 02/11/2023   Last vitamin B12 and Folate Lab Results  Component Value Date   VITAMINB12 816 02/01/2023   FOLATE >20.0 02/01/2023        Assessment & Plan:    Routine Health Maintenance and Physical Exam  Immunization History  Administered Date(s) Administered   Tdap 03/04/2021   Zoster Recombinant(Shingrix ) 10/15/2021    Health Maintenance  Topic Date Due   HEMOGLOBIN A1C  11/12/2023   Diabetic kidney evaluation - Urine ACR  02/11/2024   OPHTHALMOLOGY EXAM  01/24/2024   Medicare Annual Wellness (AWV)  02/18/2024   COVID-19  Vaccine (1) 03/04/2024 (Originally 08/04/1956)   Zoster Vaccines- Shingrix  (2 of 2) 05/17/2024 (Originally 12/10/2021)   Influenza Vaccine  05/22/2024 (Originally 09/23/2023)   Pneumococcal Vaccine: 50+ Years (1 of 2 - PCV) 02/16/2025 (Originally 02/04/1976)   Diabetic kidney evaluation - eGFR measurement  12/21/2024   FOOT EXAM  02/16/2025   Bone Density Scan  05/19/2025   Mammogram  07/10/2025   Colonoscopy  08/29/2029   DTaP/Tdap/Td (2 - Td or Tdap) 03/05/2031   Hepatitis C Screening  Completed   Meningococcal B Vaccine  Aged Out    Discussed health benefits of physical activity, and encouraged her to engage in regular exercise appropriate for her age and condition.  Problem List Items Addressed This Visit       Respiratory   Obstructive sleep apnea syndrome   Relevant Orders   CBC with Differential/Platelet   CMP14+EGFR     Endocrine   Hyperlipidemia associated with type 2 diabetes mellitus (HCC) (Chronic)   Relevant Orders   CBC with Differential/Platelet   CMP14+EGFR   Lipid panel   TSH   T4, free   Bayer DCA Hb A1c Waived   Diabetes mellitus (HCC)   Relevant Orders   CBC with Differential/Platelet   CMP14+EGFR   Lipid panel   TSH   T4, free   Bayer DCA Hb  A1c Waived   Microalbumin / creatinine urine ratio   US  ARTERIAL ABI (SCREENING LOWER EXTREMITY)   Diabetic peripheral neuropathy associated with type 2 diabetes mellitus (HCC)   Relevant Orders   CBC with Differential/Platelet   CMP14+EGFR   TSH   T4, free   Vitamin D , 25-hydroxy   Bayer DCA Hb A1c Waived   US  ARTERIAL ABI (SCREENING LOWER EXTREMITY)     Musculoskeletal and Integument   Rheumatoid arthritis (HCC)   Relevant Orders   CBC with Differential/Platelet   CMP14+EGFR   Vitamin D , 25-hydroxy     Other   Vitamin D  deficiency   Relevant Orders   CMP14+EGFR   Vitamin D , 25-hydroxy   Decreased hearing of both ears   Relevant Orders   Ambulatory referral to Audiology   Other Visit  Diagnoses       Annual physical exam    -  Primary   Relevant Orders   CBC with Differential/Platelet   CMP14+EGFR   Lipid panel         Rheumatoid arthritis of multiple sites Rheumatoid arthritis worsening, now on Enbrel . Fourth injection today. Mild improvement in symptoms. Bruising and mild reaction after last injection, managed with ice and over-the-counter cortisone. No significant side effects from current regimen. - Continue Enbrel  as prescribed - Monitor for side effects and efficacy of Enbrel  - Discuss vaccine recommendations with rheumatologist, especially pneumonia and shingles vaccines  Type 2 diabetes mellitus with peripheral neuropathy Type 2 diabetes well-controlled with diet, recent HbA1c 6.7. Previous adverse reaction to Ozempic. Considering insulin  therapy. Peripheral neuropathy with decreased sensation in toes, possibly related to diabetes. Gabapentin  reduced to 100 mg for neuropathy and headaches. - Discuss potential use of Mounjaro with endocrinologist - Continue gabapentin  100 mg for neuropathy - Ordered ABI study to assess blood flow to feet - Referred to podiatrist for foot care and neuropathy management  Obstructive sleep apnea syndrome Obstructive sleep apnea well-managed with Inspire device. Previous ENT evaluation noted age-related hearing loss. - Continue Inspire device for sleep apnea management  Bilateral hearing loss Likely age-related, confirmed by previous ENT evaluation. Hearing test suggested repeat evaluation. - Referred to audiology for repeat hearing evaluation  General Health Maintenance Routine health maintenance discussed, including vaccinations and screenings. Pneumonia and shingles vaccines recommended due to immunosuppressive therapy. Mammogram and endoscopy scheduled. - Discuss pneumonia and shingles vaccines with rheumatologist - Ensure mammogram is completed in May - Proceed with scheduled endoscopy and esophageal dilation        Return if symptoms worsen or fail to improve.     Rosaline Bruns, FNP      [1]  Social History Tobacco Use   Smoking status: Never    Passive exposure: Past   Smokeless tobacco: Never  Vaping Use   Vaping status: Never Used  Substance Use Topics   Alcohol  use: Never   Drug use: Never  [2]  Allergies Allergen Reactions   Sulfa Antibiotics Anaphylaxis and Other (See Comments)   Sulfamethoxazole-Trimethoprim Anaphylaxis   Atorvastatin Other (See Comments)    Severe Muscle and Joint pain with ALL statins    Codeine Itching and Other (See Comments)   Empagliflozin     Other Reaction(s): Unknown   Erythromycin Nausea And Vomiting   Metronidazole  Diarrhea and Other (See Comments)   Morphine And Codeine Itching   Semaglutide Nausea And Vomiting    Ozempic*   Sitagliptin     Other Reaction(s): Unknown  [3]  Outpatient Medications Prior to Visit  Medication  Sig   albuterol  (VENTOLIN  HFA) 108 (90 Base) MCG/ACT inhaler Inhale 1-2 puffs into the lungs every 6 (six) hours as needed for wheezing or shortness of breath.   baclofen  (LIORESAL ) 10 MG tablet TAKE 1 TABLET BY MOUTH IN THE MORNING AND 1 TABLET AT NOON   cholecalciferol (VITAMIN D ) 25 MCG (1000 UNIT) tablet Take 1,000 Units by mouth daily.   EPINEPHrine  0.3 mg/0.3 mL IJ SOAJ injection Inject 0.3 mg into the muscle as needed for anaphylaxis.   Eptinezumab -jjmr (VYEPTI  IV) Inject 300 mg into the vein every 3 (three) months.   etanercept  (ENBREL  SURECLICK) 50 MG/ML injection Inject 50 mg into the skin once a week. Labs due in 1 month then every 3 months   folic acid  (FOLVITE ) 1 MG tablet TAKE TWO TABLETS BY MOUTH EVERY DAY   gabapentin  (NEURONTIN ) 100 MG capsule Take 100 mg by mouth daily.   hydroxychloroquine  (PLAQUENIL ) 200 MG tablet TAKE 1 TABLET BY MOUTH TWICE DAILY MONDAY  THRU  FRIDAY  FOR  RHEUMATOID  ARTHRITIS   Lancets (ONETOUCH DELICA PLUS LANCET33G) MISC SMARTSIG:1 Topical Daily   MAGNESIUM  PO Take 500 mg by  mouth at bedtime.   methotrexate  50 MG/2ML injection Inject 0.8 mLs (20 mg total) into the skin once a week.   Multiple Vitamin (MULTIVITAMIN WITH MINERALS) TABS tablet Take 1 tablet by mouth daily.   Omega 3 1000 MG CAPS Take 1,000 mg by mouth daily.   omeprazole (PRILOSEC) 20 MG capsule Take 40 mg by mouth daily.   ondansetron  (ZOFRAN -ODT) 4 MG disintegrating tablet Take 1 tablet (4 mg total) by mouth every 8 (eight) hours as needed for nausea.   ONETOUCH VERIO test strip 1 each daily.   Polyvinyl Alcohol -Povidone (REFRESH OP) Apply to eye as needed.   Rimegepant Sulfate (NURTEC) 75 MG TBDP Take 1 tablet at the onset of a migraine.  Only 1 tablet in 24 hours   tacrolimus  (PROTOPIC ) 0.1 % ointment Apply topically 2 (two) times daily.   tiZANidine  (ZANAFLEX ) 4 MG tablet TAKE 1 TABLET BY MOUTH AT BEDTIME AS NEEDED   TUBERCULIN SYR 1CC/27GX1/2 (B-D TB SYRINGE 1CC/27GX1/2) 27G X 1/2 1 ML MISC Patient to use to inject Methotrexate  once weekly.   TURMERIC-GINGER -BLACK PEPPER PO Take by mouth.   No facility-administered medications prior to visit.   "

## 2024-02-17 NOTE — Telephone Encounter (Signed)
 Last Fill: 11/28/2023  Eye exam: 01/24/2023 WNL Patient states she had PLQ Eye Exam 11/2023, patient will call office to have fax sent shc 02/17/2024  Labs: 02/08/2024 EOS 0.6, Abs 0.2 CMP 12/22/2023 Glucose is mildly elevated, CBC normal, sed rate normal, CRP normal   Next Visit: 04/09/2024  Last Visit: 12/22/2023  DX: Rheumatoid arthritis of multiple sites with negative rheumatoid factor   Current Dose per office note 12/22/2023:: Plaquenil  200 mg 1 tablet by mouth twice daily Monday through Friday.   Okay to refill Plaquenil ?

## 2024-02-18 LAB — CMP14+EGFR
ALT: 39 IU/L — ABNORMAL HIGH (ref 0–32)
AST: 28 IU/L (ref 0–40)
Albumin: 4.8 g/dL (ref 3.9–4.9)
Alkaline Phosphatase: 84 IU/L (ref 49–135)
BUN/Creatinine Ratio: 30 — ABNORMAL HIGH (ref 12–28)
BUN: 21 mg/dL (ref 8–27)
Bilirubin Total: 0.3 mg/dL (ref 0.0–1.2)
CO2: 23 mmol/L (ref 20–29)
Calcium: 10.2 mg/dL (ref 8.7–10.3)
Chloride: 102 mmol/L (ref 96–106)
Creatinine, Ser: 0.69 mg/dL (ref 0.57–1.00)
Globulin, Total: 2 g/dL (ref 1.5–4.5)
Glucose: 118 mg/dL — ABNORMAL HIGH (ref 70–99)
Potassium: 4.2 mmol/L (ref 3.5–5.2)
Sodium: 141 mmol/L (ref 134–144)
Total Protein: 6.8 g/dL (ref 6.0–8.5)
eGFR: 95 mL/min/1.73

## 2024-02-18 LAB — LIPID PANEL
Chol/HDL Ratio: 5 ratio — ABNORMAL HIGH (ref 0.0–4.4)
Cholesterol, Total: 266 mg/dL — ABNORMAL HIGH (ref 100–199)
HDL: 53 mg/dL
LDL Chol Calc (NIH): 147 mg/dL — ABNORMAL HIGH (ref 0–99)
Triglycerides: 356 mg/dL — ABNORMAL HIGH (ref 0–149)
VLDL Cholesterol Cal: 66 mg/dL — ABNORMAL HIGH (ref 5–40)

## 2024-02-18 LAB — CBC WITH DIFFERENTIAL/PLATELET
Basophils Absolute: 0.2 x10E3/uL (ref 0.0–0.2)
Basos: 2 %
EOS (ABSOLUTE): 0.5 x10E3/uL — ABNORMAL HIGH (ref 0.0–0.4)
Eos: 5 %
Hematocrit: 42.6 % (ref 34.0–46.6)
Hemoglobin: 14.2 g/dL (ref 11.1–15.9)
Immature Grans (Abs): 0.1 x10E3/uL (ref 0.0–0.1)
Immature Granulocytes: 1 %
Lymphocytes Absolute: 4 x10E3/uL — ABNORMAL HIGH (ref 0.7–3.1)
Lymphs: 36 %
MCH: 31.4 pg (ref 26.6–33.0)
MCHC: 33.3 g/dL (ref 31.5–35.7)
MCV: 94 fL (ref 79–97)
Monocytes Absolute: 0.8 x10E3/uL (ref 0.1–0.9)
Monocytes: 7 %
Neutrophils Absolute: 5.4 x10E3/uL (ref 1.4–7.0)
Neutrophils: 49 %
Platelets: 442 x10E3/uL (ref 150–450)
RBC: 4.52 x10E6/uL (ref 3.77–5.28)
RDW: 14.5 % (ref 11.7–15.4)
WBC: 10.9 x10E3/uL — ABNORMAL HIGH (ref 3.4–10.8)

## 2024-02-18 LAB — MICROALBUMIN / CREATININE URINE RATIO
Creatinine, Urine: 50.8 mg/dL
Microalb/Creat Ratio: 9 mg/g{creat} (ref 0–29)
Microalbumin, Urine: 4.6 ug/mL

## 2024-02-18 LAB — VITAMIN D 25 HYDROXY (VIT D DEFICIENCY, FRACTURES): Vit D, 25-Hydroxy: 50.1 ng/mL (ref 30.0–100.0)

## 2024-02-18 LAB — TSH: TSH: 1.1 u[IU]/mL (ref 0.450–4.500)

## 2024-02-18 LAB — T4, FREE: Free T4: 0.88 ng/dL (ref 0.82–1.77)

## 2024-02-19 ENCOUNTER — Ambulatory Visit: Payer: Self-pay | Admitting: Family Medicine

## 2024-02-20 ENCOUNTER — Other Ambulatory Visit: Payer: Self-pay

## 2024-02-21 ENCOUNTER — Ambulatory Visit (INDEPENDENT_AMBULATORY_CARE_PROVIDER_SITE_OTHER): Admitting: Otolaryngology

## 2024-02-21 ENCOUNTER — Encounter (INDEPENDENT_AMBULATORY_CARE_PROVIDER_SITE_OTHER): Payer: Self-pay | Admitting: Otolaryngology

## 2024-02-21 VITALS — BP 126/86 | HR 87 | Ht 64.0 in | Wt 172.0 lb

## 2024-02-21 DIAGNOSIS — G4733 Obstructive sleep apnea (adult) (pediatric): Secondary | ICD-10-CM | POA: Diagnosis not present

## 2024-02-21 DIAGNOSIS — H903 Sensorineural hearing loss, bilateral: Secondary | ICD-10-CM

## 2024-02-21 DIAGNOSIS — J3089 Other allergic rhinitis: Secondary | ICD-10-CM

## 2024-02-21 DIAGNOSIS — Z9889 Other specified postprocedural states: Secondary | ICD-10-CM

## 2024-02-21 DIAGNOSIS — J328 Other chronic sinusitis: Secondary | ICD-10-CM | POA: Diagnosis not present

## 2024-02-21 DIAGNOSIS — H9311 Tinnitus, right ear: Secondary | ICD-10-CM | POA: Diagnosis not present

## 2024-02-21 DIAGNOSIS — R0982 Postnasal drip: Secondary | ICD-10-CM | POA: Diagnosis not present

## 2024-02-21 DIAGNOSIS — R0981 Nasal congestion: Secondary | ICD-10-CM

## 2024-02-21 DIAGNOSIS — Z789 Other specified health status: Secondary | ICD-10-CM

## 2024-02-21 MED ORDER — AZELASTINE HCL 0.1 % NA SOLN: 2.0000 | Freq: Two times a day (BID) | NASAL | 12 refills | Status: AC

## 2024-02-21 NOTE — Progress Notes (Signed)
 Dear Dr. Harl, Here is my assessment for our mutual patient, Christina Lewis. Thank you for allowing me the opportunity to care for your patient. Please do not hesitate to contact me should you have any other questions. Sincerely, Dr. Eldora Blanch  Otolaryngology Clinic Note Referring provider: Dr. Harl HPI:  Christina Lewis is a 68 y.o. female kindly referred by Dr. Harl for evaluation of issues with Inspire Device  Initial visit (01/2024): Noted prior inspire implant by Dr. [place], good outcome with AHI <1. Some tenderness around generator, with pain with movement. Mild tenderness but only generally noticeable when she first wakes up. She reports that sometimes it will swell up, but no significant redness. Only lasts for a few hours and goes away. No tongue weakness.  Never looked infected. She is otherwise not having any issues with stimulation itself and tolerates that well. No undue tethering on the neck or feeling like things have shifted. Mostly just some tenderness with manipulation  Still feeling fairly congested from nose standpoint and having issue with tinnitus. Long-standing SNHL. From nasal standpoint on singulair , carbinoxamine , steroid rinses, atrovent , AIT. Had infection 2-3 weeks ago, mostly on the left side with discolored drainage and thick secretions.  ENT Surgery: FESS, Inspire Personal or FHx of bleeding dz or anesthesia difficulty: no   Tobacco: no  PMHx: RA, DM, OSA, Migraines, Allergies on AIT  Independent Review of Additional Tests or Records:  Dr. Harl Referral notes reviewed and uploaded or available in chart in media tab (02/03/2024): noted tenderness around generation with pain with movement. Mild tenderness. Activated 01/19/2023. Dx: Pain and intermittent bulging with Device; Rx: ref to ENT  MRI Brain 12/05/2023 interpreted: prior b/l FESS, left max, ethmoid and frontal T2 bright opacification Shoulder XR 11/20/03/2023: implant appears  rotated 45 degrees but leads in place and appear in continuity.   Dr. Okey (11/14/2023): chronic congestion and drainage, despite sprays and decongestant, mucinex and steroid nasal wash; on doxy, on carbinoxamine ; Dx: Drainage and PND; Rx: atrovent , continue steroid rinses, consider AIT  CBC w/diff 02/17/2024: WBC 10.9, Eos 500 Allergy  testing 07/25/2023:   PMH/Meds/All/SocHx/FamHx/ROS:   Past Medical History:  Diagnosis Date   Achilles tendonitis    right foot   Allergy     Anxiety    Arthritis 2007   Cataract 2020   Depression 2012   Eczema    Fibromyalgia    GERD (gastroesophageal reflux disease) 2008   Gout    no current problem   Hematuria    History of kidney stones    passed stones and also had surgery to remove one   History of tics    Hyperlipidemia 2007   Ineffective esophageal motility    tx with prilosec, essophageal was stretched   Interstitial cystitis 2016   Migraines    Osteoarthritis    PCOS (polycystic ovarian syndrome) 1993   Plantar fasciitis, bilateral    Pneumonia 2019   PONV (postoperative nausea and vomiting)    Pre-diabetes    Recurrent upper respiratory infection (URI)    Sleep apnea 1998   inspire device   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, took medication for a year   Thyroid  nodule    Dr Bella is monitoring every year   Type 2 diabetes mellitus in patient with obesity (HCC) 03/04/2021    IMO SNOMED Dx Update Oct 2024     Past Surgical History:  Procedure Laterality Date   ABDOMINAL HYSTERECTOMY  2000   APPENDECTOMY  1976   BALLOON DILATION N/A 11/03/2021   Procedure: BALLOON DILATION;  Surgeon: Saintclair Jasper, MD;  Location: [facility];  Service: Gastroenterology;  Laterality: N/A;   BOTOX  INJECTION N/A 11/03/2021   Procedure: BOTOX  INJECTION;  Surgeon: Saintclair Jasper, MD;  Location: [facility];  Service: Gastroenterology;  Laterality: N/A;   BREAST LUMPECTOMY  1989   lt-negative   CARDIOVASCULAR STRESS TEST   2000   CATARACT EXTRACTION W/PHACO Left 12/07/2019   Procedure: CATARACT EXTRACTION PHACO AND INTRAOCULAR LENS PLACEMENT (IOC);  Surgeon: Harrie Agent, MD;  Location: [facility];  Service: Ophthalmology;  Laterality: Left;  CDE: 5.55   CHOLECYSTECTOMY  1985   COLONOSCOPY     DRUG INDUCED ENDOSCOPY Bilateral 09/29/2022   Procedure: DRUG INDUCED ENDOSCOPY;  Surgeon: Okey Burns, MD;  Location: [facility];  Service: ENT;  Laterality: Bilateral;  15 MINUTES   ESOPHAGEAL MANOMETRY N/A 08/24/2021   Procedure: ESOPHAGEAL MANOMETRY (EM);  Surgeon: Saintclair Jasper, MD;  Location: [facility];  Service: Gastroenterology;  Laterality: N/A;   ESOPHAGOGASTRODUODENOSCOPY N/A 11/03/2021   Procedure: ESOPHAGOGASTRODUODENOSCOPY (EGD);  Surgeon: Saintclair Jasper, MD;  Location: [facility];  Service: Gastroenterology;  Laterality: N/A;   EYE SURGERY  2021   HYSTERECTOMY ABDOMINAL WITH SALPINGECTOMY Bilateral 2000   IMPLANTATION OF HYPOGLOSSAL NERVE STIMULATOR Right 11/24/2022   Procedure: IMPLANTATION OF HYPOGLOSSAL NERVE STIMULATOR;  Surgeon: Okey Burns, MD;  Location: [facility];  Service: ENT;  Laterality: Right;   KIDNEY STONE SURGERY Right 2014   with stent placement in OR   KNEE ARTHROSCOPY Right 08/22/2013   Procedure: ARTHROSCOPY RIGHT KNEE FOR INFECTION LAVAGE AND DRAINAGE;  Surgeon: LELON JONETTA Shari Mickey., MD;  Location: [facility];  Service: Orthopedics;  Laterality: Right;   KNEE ARTHROSCOPY WITH PATELLA RECONSTRUCTION Right 08/06/2013   Procedure: RIGHT KNEE ARTHROSCOPY WITH MENISCECTOMY MEDIAL, ARTHROSCOPY KNEE WITH DEBRIDEMENT/SHAVING (CHONDROPLASTY) ;  Surgeon: LELON JONETTA Shari Mickey., MD;  Location: [facility];  Service: Orthopedics;  Laterality: Right;   LUMBAR LAMINECTOMY/DECOMPRESSION MICRODISCECTOMY Right 09/11/2019   Procedure: Right Lumbar Three-Four Microdiscectomy;  Surgeon: Unice Pac, MD;  Location: [facility];  Service: Neurosurgery;  Laterality: Right;  Right  Lumbar Three-Four Microdiscectomy   NASAL SINUS SURGERY     x4   OOPHORECTOMY     SHOULDER SURGERY  2011   right shoulder   SPINE SURGERY  2020   THYROID  SURGERY  1994   TONSILLECTOMY Bilateral 1974    Family History  Problem Relation Age of Onset   Fibromyalgia Mother    Psoriasis Mother        psoriatic arthritis    Diabetes Mother    Heart disease Mother    Arthritis Mother    Asthma Mother    Hyperlipidemia Mother    Hypertension Mother    Stroke Mother    Rheum arthritis Sister    Arthritis Sister    Asthma Sister    Diabetes Sister    Hyperlipidemia Sister    Miscarriages / Stillbirths Sister    Psoriasis Sister        psoriatic arthritis   Arthritis Sister    Asthma Sister    Diabetes Sister    Hyperlipidemia Sister    Hypertension Sister    Miscarriages / Stillbirths Sister      Social Connections: Moderately Isolated (12/06/2023)   Social Connection and Isolation Panel    Frequency of Communication with Friends and Family: More than three times a week    Frequency of Social Gatherings  with Friends and Family: More than three times a week    Attends Religious Services: 1 to 4 times per year    Active Member of Clubs or Organizations: No    Attends Banker Meetings: Not on file    Marital Status: Widowed     Current Medications[1]   Physical Exam:   BP 126/86 (BP Location: Left Arm, Patient Position: Sitting, Cuff Size: Normal)   Pulse 87   Ht 5' 4 (1.626 m)   Wt 172 lb (78 kg)   SpO2 95%   BMI 29.52 kg/m   Salient findings:  CN II-XII intact Bilateral EAC clear and TM intact with well pneumatized middle ear spaces Anterior rhinoscopy: Septum intact; bilateral inferior turbinates with modest hypertrophy; Nasal endoscopy was indicated to better evaluate the nose and paranasal sinuses, given the patient's history and exam findings, and is detailed below. No lesions of oral cavity/oropharynx; good tongue protrusion No obviously  palpable neck masses/lymphadenopathy/thyromegaly No respiratory distress or stridor Neck and chest scars are well healed, mild tenderness; the chest portion of implant is slight mobile but does not appear significant; no redness/swelling/tenderness  Seprately Identifiable Procedures:  Prior to initiating any procedures, risks/benefits/alternatives were explained to the patient and verbal consent obtained. Procedure Note PROCEDURE: Bilateral Diagnostic Rigid Nasal Endoscopy with LEFT Debridement Pre-procedure diagnosis: Post-operative examination and care after bilateral Functional Endoscopic Sinus Surgery - of note: this procedure was NOT performed for management of the septoplasty or the turbinate reduction. Post-procedure diagnosis: same Indication: See pre-procedure diagnosis and physical exam above Complications: None apparent EBL: 0 mL Anesthesia: Lidocaine  4% and topical decongestant was topically sprayed in each nasal cavity  Description of Procedure:  Patient was identified. A rigid 30 degree endoscope was utilized to evaluate the sinonasal cavities, mucosa, sinus ostia and turbinates and septum.  Overall, signs of mucosal inflammation are noted. Also noted are post-surgical changes with some crusting within bilateral middle meati but left obstructive -- some thick eosinophilic type secretions as well; some medialization left MT; These were debrided with a 8 Fr suction. After debridement, sinus cavity patency was much improved. No adverse synechiae are noted. Right Middle meatus: clear Right SE Recess: clear Left MM: see above Left SE Recess: clear  CPT CODE -- 68762 - Mod 25, LT  Electronically signed by: Eldora KATHEE Blanch, MD 02/21/2024 8:33 AM   Impression & Plans:  Christina Lewis is a 68 y.o. female with  1. Obstructive sleep apnea   2. Sensorineural hearing loss (SNHL) of both ears   3. Tinnitus of right ear   4. Environmental and seasonal allergies   5. Post-nasal drip    6. Chronic nasal congestion   7. Other chronic sinusitis   8. Intolerance of continuous positive airway pressure (CPAP) ventilation   9. S/P FESS (functional endoscopic sinus surgery)    Multiple issues: - From nasal standpoint: debrided today, is having some thick eosinophilic type secretions; cannot see obvious recirculation; clearly allergies playing a role given Eos but we will continue medical management; she is having benefit with PO antihistamine so will add astelin  to rinses to see if it will make a difference  - OSA standpoint: Inspire therapy going well BUT having some discomfort with chest portion if implant; she had a recent XR and it looks like leads are well positioned and does not appear to have undue tension; chest portion is rotated about 45 degrees, so I wonder if this is what is causing her discomfort but it  is not entirely clear. Given her response, and she reports that her pain is intermittent mostly in morning, we discussed possible revision (may not help) v/s observation. She opted for observation. If at any point starts to have issues with therapy, can revise chest portion  - Ears: b/l SNHL, tinnitus right; will obtain repeat audio as she is interested  F/u May 2025  See below regarding exact medications prescribed this encounter including dosages and route: Meds ordered this encounter  Medications   azelastine  (ASTELIN ) 0.1 % nasal spray    Sig: Place 2 sprays into both nostrils 2 (two) times daily. Use in each nostril as directed    Dispense:  30 mL    Refill:  12      Thank you for allowing me the opportunity to care for your patient. Please do not hesitate to contact me should you have any other questions.  Sincerely, Eldora Blanch, MD Otolaryngologist (ENT), [facility] Phone: [phone] Fax: [phone]  02/21/2024, 8:33 AM   MDM:  I have personally spent 41 minutes involved in face-to-face and non-face-to-face activities for this  patient on the day of the visit.  Professional time spent excludes any procedures performed but includes the following activities, in addition to those noted in the documentation: preparing to see the patient (review of outside documentation and results), performing a medically appropriate examination, counseling, ordering medications (astelin ), documenting in the electronic health record, independently interpreting results (XR, MRI).       [1]  Current Outpatient Medications:    albuterol  (VENTOLIN  HFA) 108 (90 Base) MCG/ACT inhaler, Inhale 1-2 puffs into the lungs every 6 (six) hours as needed for wheezing or shortness of breath., Disp: 8 g, Rfl: 2   azelastine  (ASTELIN ) 0.1 % nasal spray, Place 2 sprays into both nostrils 2 (two) times daily. Use in each nostril as directed, Disp: 30 mL, Rfl: 12   baclofen  (LIORESAL ) 10 MG tablet, TAKE 1 TABLET BY MOUTH IN THE MORNING AND 1 TABLET AT NOON, Disp: 60 tablet, Rfl: 0   cholecalciferol (VITAMIN D ) 25 MCG (1000 UNIT) tablet, Take 1,000 Units by mouth daily., Disp: , Rfl:    EPINEPHrine  0.3 mg/0.3 mL IJ SOAJ injection, Inject 0.3 mg into the muscle as needed for anaphylaxis., Disp: 0.3 mL, Rfl: 1   Eptinezumab -jjmr (VYEPTI  IV), Inject 300 mg into the vein every 3 (three) months., Disp: , Rfl:    etanercept  (ENBREL  SURECLICK) 50 MG/ML injection, Inject 50 mg into the skin once a week. Labs due in 1 month then every 3 months, Disp: 4 mL, Rfl: 1   folic acid  (FOLVITE ) 1 MG tablet, TAKE TWO TABLETS BY MOUTH EVERY DAY, Disp: 180 tablet, Rfl: 3   gabapentin  (NEURONTIN ) 100 MG capsule, Take 100 mg by mouth daily., Disp: , Rfl:    hydroxychloroquine  (PLAQUENIL ) 200 MG tablet, TAKE 1 TABLET BY MOUTH TWICE DAILY MONDAY  THRU  FRIDAY  FOR  RHEUMATOID  ARTHRITIS, Disp: 120 tablet, Rfl: 0   Lancets (ONETOUCH DELICA PLUS LANCET33G) MISC, SMARTSIG:1 Topical Daily, Disp: , Rfl:    MAGNESIUM  PO, Take 500 mg by mouth at bedtime., Disp: , Rfl:    methotrexate  50 MG/2ML  injection, Inject 0.8 mLs (20 mg total) into the skin once a week., Disp: 10 mL, Rfl: 0   Multiple Vitamin (MULTIVITAMIN WITH MINERALS) TABS tablet, Take 1 tablet by mouth daily., Disp: , Rfl:    Omega 3 1000 MG CAPS, Take 1,000 mg by mouth daily., Disp: , Rfl:  omeprazole (PRILOSEC) 20 MG capsule, Take 40 mg by mouth daily., Disp: , Rfl:    ondansetron  (ZOFRAN -ODT) 4 MG disintegrating tablet, Take 1 tablet (4 mg total) by mouth every 8 (eight) hours as needed for nausea., Disp: 20 tablet, Rfl: 5   ONETOUCH VERIO test strip, 1 each daily., Disp: , Rfl:    Polyvinyl Alcohol -Povidone (REFRESH OP), Apply to eye as needed., Disp: , Rfl:    Rimegepant Sulfate (NURTEC) 75 MG TBDP, Take 1 tablet at the onset of a migraine.  Only 1 tablet in 24 hours, Disp: 8 tablet, Rfl: 11   tacrolimus  (PROTOPIC ) 0.1 % ointment, Apply topically 2 (two) times daily., Disp: 60 g, Rfl: 6   tiZANidine  (ZANAFLEX ) 4 MG tablet, TAKE 1 TABLET BY MOUTH AT BEDTIME AS NEEDED, Disp: 30 tablet, Rfl: 0   TUBERCULIN SYR 1CC/27GX1/2 (B-D TB SYRINGE 1CC/27GX1/2) 27G X 1/2 1 ML MISC, Patient to use to inject Methotrexate  once weekly., Disp: 12 each, Rfl: 3   TURMERIC-GINGER -BLACK PEPPER PO, Take by mouth., Disp: , Rfl:

## 2024-02-21 NOTE — Patient Instructions (Signed)
 Rinse twice a day --- both times, put 4-6 sprays of the astelin  spray in the rinse and rinse half on each side. The other time, instead of the budesonide , put in flonase  4 sprays.

## 2024-02-22 ENCOUNTER — Ambulatory Visit: Payer: Self-pay | Admitting: Allergy & Immunology

## 2024-02-22 ENCOUNTER — Ambulatory Visit (INDEPENDENT_AMBULATORY_CARE_PROVIDER_SITE_OTHER)

## 2024-02-22 DIAGNOSIS — J302 Other seasonal allergic rhinitis: Secondary | ICD-10-CM | POA: Diagnosis not present

## 2024-02-22 DIAGNOSIS — J309 Allergic rhinitis, unspecified: Secondary | ICD-10-CM

## 2024-02-24 ENCOUNTER — Ambulatory Visit (HOSPITAL_COMMUNITY): Admission: RE | Admit: 2024-02-24 | Source: Ambulatory Visit

## 2024-02-24 ENCOUNTER — Ambulatory Visit

## 2024-02-24 DIAGNOSIS — J309 Allergic rhinitis, unspecified: Secondary | ICD-10-CM

## 2024-02-24 DIAGNOSIS — J302 Other seasonal allergic rhinitis: Secondary | ICD-10-CM | POA: Diagnosis not present

## 2024-02-24 DIAGNOSIS — E1169 Type 2 diabetes mellitus with other specified complication: Secondary | ICD-10-CM | POA: Diagnosis present

## 2024-02-24 DIAGNOSIS — E1142 Type 2 diabetes mellitus with diabetic polyneuropathy: Secondary | ICD-10-CM | POA: Diagnosis present

## 2024-02-27 ENCOUNTER — Other Ambulatory Visit (HOSPITAL_COMMUNITY)

## 2024-02-27 ENCOUNTER — Other Ambulatory Visit: Payer: Self-pay | Admitting: *Deleted

## 2024-02-27 MED ORDER — NURTEC 75 MG PO TBDP: ORAL_TABLET | ORAL | 11 refills | Status: AC

## 2024-02-27 NOTE — H&P (Signed)
 History of Present Illness General:  This is a 69 year old patient here today to discuss dysphagia.  She reports for the last 6-7 months  dysphagia has gotten worse.   Also, she has episodes of choking  on food and liquids. She uses Inspire for OSA and is working well. Denies nausea and vomiting. denies melena or hematochezia. She denies weight loss, constipation, diarrhea.  no change in bowel habits  EGD 11/03/2021 showed Dilated and Botox  - no specimens. Esophageal manometry 08/2021 showed ineffective esophageal moltility.  Colonoscopy/EGD 08/29/2019 showd 1 Adenoma and other is Large inflammatory polyp - Repeat in 5 years. No H Pylori or EOE.  Vital Signs Wt: 167, Wt change: -1 lbs, Ht: 64, BMI: 28.66, Pulse sitting: 75, BP sitting: 111/69. Physical Examination    GENERAL:  Patient appears pleasant, well-developed.     HEENT:  Throat: unremarkable.     ABDOMEN:  General: soft, non tender, non distended.     PSYCHOLOGY:  Affect: alert and oriented x 3, mood and affect appear normal.   Assessments 1. Dysphagia - R13.10 (Primary)    Treatment 1. Dysphagia      IMAGING: EGD w/ DILATATION (Ordered for 01/13/2024) Notes: Janit Folks 01/13/2024 02:55:57 PM > Pt scheduled for EGDwdil with Dr. Saintclair 02-28-23 at [facility] . Patient Zell of Rights, Consent Form, Instructions, and Insurance forms given to patient.     Notes: EGD 11/03/2021 showed Dilated - no specimens.  The procedure EGD with dilatation was thoroughly explained to the patient, including the risks, benefits and alternatives. We discussed the complications including Bleeding and perforation, sedation problems and missing something. The patient verbalized understanding and agree.  EGD ordered.

## 2024-02-27 NOTE — Telephone Encounter (Signed)
 Completed form placed in [name],NP box for review of form and signature this am

## 2024-02-28 ENCOUNTER — Ambulatory Visit (HOSPITAL_COMMUNITY): Admitting: Certified Registered Nurse Anesthetist

## 2024-02-28 ENCOUNTER — Encounter (HOSPITAL_COMMUNITY)
Admission: RE | Disposition: A | Discharge status: Home / Self Care | Payer: Self-pay | Source: Home / Self Care | Attending: Gastroenterology

## 2024-02-28 ENCOUNTER — Encounter (HOSPITAL_COMMUNITY): Payer: Self-pay | Admitting: Gastroenterology

## 2024-02-28 ENCOUNTER — Ambulatory Visit (HOSPITAL_COMMUNITY)
Admission: RE | Admit: 2024-02-28 | Discharge: 2024-02-28 | Disposition: A | Discharge status: Home / Self Care | Attending: Gastroenterology | Admitting: Gastroenterology

## 2024-02-28 ENCOUNTER — Other Ambulatory Visit: Payer: Self-pay

## 2024-02-28 DIAGNOSIS — F418 Other specified anxiety disorders: Secondary | ICD-10-CM | POA: Diagnosis not present

## 2024-02-28 DIAGNOSIS — G4733 Obstructive sleep apnea (adult) (pediatric): Secondary | ICD-10-CM | POA: Insufficient documentation

## 2024-02-28 DIAGNOSIS — K449 Diaphragmatic hernia without obstruction or gangrene: Secondary | ICD-10-CM

## 2024-02-28 DIAGNOSIS — K219 Gastro-esophageal reflux disease without esophagitis: Secondary | ICD-10-CM | POA: Diagnosis not present

## 2024-02-28 DIAGNOSIS — M797 Fibromyalgia: Secondary | ICD-10-CM | POA: Diagnosis not present

## 2024-02-28 DIAGNOSIS — E119 Type 2 diabetes mellitus without complications: Secondary | ICD-10-CM | POA: Insufficient documentation

## 2024-02-28 DIAGNOSIS — R1310 Dysphagia, unspecified: Secondary | ICD-10-CM | POA: Diagnosis present

## 2024-02-28 DIAGNOSIS — M1990 Unspecified osteoarthritis, unspecified site: Secondary | ICD-10-CM | POA: Diagnosis not present

## 2024-02-28 DIAGNOSIS — F32A Depression, unspecified: Secondary | ICD-10-CM | POA: Diagnosis not present

## 2024-02-28 DIAGNOSIS — F419 Anxiety disorder, unspecified: Secondary | ICD-10-CM | POA: Insufficient documentation

## 2024-02-28 DIAGNOSIS — Q399 Congenital malformation of esophagus, unspecified: Secondary | ICD-10-CM | POA: Insufficient documentation

## 2024-02-28 DIAGNOSIS — K3189 Other diseases of stomach and duodenum: Secondary | ICD-10-CM | POA: Insufficient documentation

## 2024-02-28 HISTORY — DX: Prediabetes: R73.03

## 2024-02-28 HISTORY — PX: ESOPHAGOGASTRODUODENOSCOPY: SHX5428

## 2024-02-28 HISTORY — PX: BIOPSY OF SKIN SUBCUTANEOUS TISSUE AND/OR MUCOUS MEMBRANE: SHX6741

## 2024-02-28 HISTORY — PX: BOTOX INJECTION: SHX5754

## 2024-02-28 LAB — GLUCOSE, CAPILLARY: Glucose-Capillary: 133 mg/dL — ABNORMAL HIGH (ref 70–99)

## 2024-02-28 SURGERY — EGD (ESOPHAGOGASTRODUODENOSCOPY)
Anesthesia: Monitor Anesthesia Care

## 2024-02-28 MED ORDER — PROPOFOL 500 MG/50ML IV EMUL
INTRAVENOUS | Status: AC
  Filled 2024-02-28: qty 100

## 2024-02-28 MED ORDER — ONABOTULINUMTOXINA 100 UNITS IJ SOLR
INTRAMUSCULAR | Status: AC
  Filled 2024-02-28: qty 100

## 2024-02-28 MED ORDER — SODIUM CHLORIDE (PF) 0.9 % IJ SOLN
INTRAMUSCULAR | Status: DC | PRN
  Administered 2024-02-28: 4 mL via SUBMUCOSAL

## 2024-02-28 MED ORDER — SODIUM CHLORIDE (PF) 0.9 % IJ SOLN
INTRAMUSCULAR | Status: AC
  Filled 2024-02-28: qty 10

## 2024-02-28 MED ORDER — PROPOFOL 500 MG/50ML IV EMUL
INTRAVENOUS | Status: DC | PRN
  Administered 2024-02-28: 50 mg via INTRAVENOUS
  Administered 2024-02-28: 30 mg via INTRAVENOUS
  Administered 2024-02-28: 150 ug/kg/min via INTRAVENOUS

## 2024-02-28 MED ORDER — ONDANSETRON HCL 4 MG/2ML IJ SOLN
INTRAMUSCULAR | Status: AC
  Filled 2024-02-28: qty 2

## 2024-02-28 MED ORDER — LACTATED RINGERS IV SOLN: INTRAVENOUS | Status: DC | PRN

## 2024-02-28 MED ORDER — SODIUM CHLORIDE 0.9 % IV SOLN
INTRAVENOUS | Status: AC | PRN
  Administered 2024-02-28: 500 mL via INTRAVENOUS

## 2024-02-28 MED ORDER — LIDOCAINE HCL (PF) 2 % IJ SOLN
INTRAMUSCULAR | Status: DC | PRN
  Administered 2024-02-28: 80 mg via INTRADERMAL

## 2024-02-28 MED ORDER — ONDANSETRON HCL 4 MG/2ML IJ SOLN
4.0000 mg | Freq: Once | INTRAMUSCULAR | Status: AC
  Administered 2024-02-28: 4 mg via INTRAVENOUS

## 2024-02-28 NOTE — Interval H&P Note (Signed)
 History and Physical Interval Note: 68/female with dysphagia for EGD with biopsies, possible balloon dilation and botox  injection with propofol .  02/28/2024 7:32 AM  Christina Lewis  has presented today for EGD with biopsies, possible balloon dilation and botox  injection with propofol , with the diagnosis of R13.10 Dysphagia.  The various methods of treatment have been discussed with the patient and family. After consideration of risks, benefits and other options for treatment, the patient has consented to  Procedures: EGD (ESOPHAGOGASTRODUODENOSCOPY) (N/A) BOTOX  INJECTION (N/A) Balloon dilation wire-guided (N/A) as a surgical intervention.  The patient's history has been reviewed, patient examined, no change in status, stable for surgery.  I have reviewed the patient's chart and labs.  Questions were answered to the patient's satisfaction.     [name]

## 2024-02-28 NOTE — Discharge Instructions (Signed)
YOU HAD AN ENDOSCOPIC PROCEDURE TODAY: Refer to the procedure report and other information in the discharge instructions given to you for any specific questions about what was found during the examination. If this information does not answer your questions, please call the [facility] office at [phone] to clarify.   YOU SHOULD EXPECT: Some feelings of bloating in the abdomen. Passage of more gas than usual. Walking can help get rid of the air that was put into your GI tract during the procedure and reduce the bloating.  DIET: Your first meal following the procedure should be a light meal and then it is ok to progress to your normal diet. A half-sandwich or bowl of soup is an example of a good first meal. Heavy or fried foods are harder to digest and may make you feel nauseous or bloated. Drink plenty of fluids but you should avoid alcoholic beverages for 24 hours.   ACTIVITY: Your care partner should take you home directly after the procedure. You should plan to take it easy, moving slowly for the rest of the day. You can resume normal activity the day after the procedure however YOU SHOULD NOT DRIVE, use power tools, machinery or perform tasks that involve climbing or major physical exertion for 24 hours (because of the sedation medicines used during the test).   SYMPTOMS TO REPORT IMMEDIATELY: A gastroenterologist can be reached at any hour. Please call [phone]  for any of the following symptoms:   Following upper endoscopy (EGD, EUS, ERCP, esophageal dilation) Vomiting of blood or coffee ground material  New, significant abdominal pain  New, significant chest pain or pain under the shoulder blades  Painful or persistently difficult swallowing  New shortness of breath  Black, tarry-looking or red, bloody stools  FOLLOW UP:  If any biopsies were taken you will be contacted by phone or by letter within the next 1-3 weeks. Call [phone]  if you have not heard about the biopsies in 3  weeks.  Please also call with any specific questions about appointments or follow up tests. 

## 2024-02-28 NOTE — Progress Notes (Addendum)
 Pt presented to endoscopy today for EGD with Dr Saintclair. Post-operatively, pt c/o nausea. MDA Treen notified, VO for 4 mg ondansetron  IV x1. Medication given as ordered.  Ozell VEAR Pouch, RN 02/28/2024 9:02 AM  Addendum 9079: Pt endorses improvement in nausea s/p ondansetron  administration. Pt discharged to home.

## 2024-02-28 NOTE — Anesthesia Preprocedure Evaluation (Signed)
"                                    Anesthesia Evaluation  Patient identified by MRN, date of birth, ID band Patient awake    Reviewed: Allergy  & Precautions, H&P , NPO status , Patient's Chart, lab work & pertinent test results  History of Anesthesia Complications (+) PONV and history of anesthetic complications  Airway Mallampati: II  TM Distance: >3 FB Neck ROM: Full    Dental no notable dental hx.    Pulmonary sleep apnea    Pulmonary exam normal breath sounds clear to auscultation       Cardiovascular (-) angina (-) Past MI negative cardio ROS Normal cardiovascular exam Rhythm:Regular Rate:Normal     Neuro/Psych  Headaches PSYCHIATRIC DISORDERS Anxiety Depression     Neuromuscular disease    GI/Hepatic Neg liver ROS,GERD  ,,Dysphagia    Endo/Other  diabetes, Type 2    Renal/GU negative Renal ROS  negative genitourinary   Musculoskeletal  (+) Arthritis ,  Fibromyalgia -  Abdominal   Peds negative pediatric ROS (+)  Hematology negative hematology ROS (+)   Anesthesia Other Findings   Reproductive/Obstetrics negative OB ROS                              Anesthesia Physical Anesthesia Plan  ASA: 3  Anesthesia Plan: MAC   Post-op Pain Management:    Induction: Intravenous  PONV Risk Score and Plan: 3 and Propofol  infusion and Treatment may vary due to age or medical condition  Airway Management Planned: Natural Airway  Additional Equipment:   Intra-op Plan:   Post-operative Plan:   Informed Consent: I have reviewed the patients History and Physical, chart, labs and discussed the procedure including the risks, benefits and alternatives for the proposed anesthesia with the patient or authorized representative who has indicated his/her understanding and acceptance.     Dental advisory given  Plan Discussed with: CRNA  Anesthesia Plan Comments:          Anesthesia Quick Evaluation  "

## 2024-02-28 NOTE — Op Note (Signed)
 [facility] Patient Name: Christina Lewis Procedure Date: 02/28/2024 MRN: [id number] Attending MD: [name] , MD, [id number] Date of Birth: 1955/04/10 CSN: [id number] Age: 69 Admit Type: Outpatient Procedure:                Upper GI endoscopy Indications:              Dysphagia Providers:                [name], MD, Randall Lines, RN, Corene Southgate,                            Technician Referring MD:             Rock Bruns, FNP Medicines:                Monitored Anesthesia Care Complications:            No immediate complications. Estimated blood loss:                            Minimal. Estimated Blood Loss:     Estimated blood loss was minimal. Procedure:                Pre-Anesthesia Assessment:                           - Prior to the procedure, a History and Physical                            was performed, and patient medications and                            allergies were reviewed. The patient's tolerance of                            previous anesthesia was also reviewed. The risks                            and benefits of the procedure and the sedation                            options and risks were discussed with the patient.                            All questions were answered, and informed consent                            was obtained. Prior Anticoagulants: The patient has                            taken no anticoagulant or antiplatelet agents. ASA                            Grade Assessment: III - A patient with severe                            systemic  disease. After reviewing the risks and                            benefits, the patient was deemed in satisfactory                            condition to undergo the procedure.                           After obtaining informed consent, the endoscope was                            passed under direct vision. Throughout the                            procedure, the patient's blood pressure,  pulse, and                            oxygen saturations were monitored continuously. The                            [id number]-H190 ([id number]) Olympus endoscope was introduced                            through the mouth, and advanced to the second part                            of duodenum. The upper GI endoscopy was                            accomplished without difficulty. The patient                            tolerated the procedure well. Scope In: Scope Out: Findings:      The middle third of the esophagus and lower third of the esophagus were       moderately tortuous. Biopsies were obtained from the proximal and distal       esophagus with cold forceps for histology of suspected eosinophilic       esophagitis.      An area in the lower third of the esophagus, 1cm above the GE junction,       was successfully injected with 100 units botulinum toxin, 25 units/ml, 1       ml in each of the four quadrants of distal esophagus.      A 7 cm hiatal hernia was present.      Patchy mildly erythematous mucosa without bleeding was found in the       gastric body and in the gastric antrum. Biopsies were taken with a cold       forceps for Helicobacter pylori testing.      The cardia and gastric fundus were normal on retroflexion.      The examined duodenum was normal. Impression:               - Tortuous esophagus.                           -  7 cm hiatal hernia.                           - Erythematous mucosa in the gastric body and                            antrum. Biopsied.                           - Normal examined duodenum.                           - Biopsies were taken with a cold forceps for                            evaluation of eosinophilic esophagitis.                           - An area in the lower third of the esophagus                            successfully injected with botulinum toxin. Moderate Sedation:      Patient did not receive moderate sedation for this procedure,  but       instead received monitored anesthesia care. Recommendation:           - Patient has a contact number available for                            emergencies. The signs and symptoms of potential                            delayed complications were discussed with the                            patient. Return to normal activities tomorrow.                            Written discharge instructions were provided to the                            patient.                           - Advance diet as tolerated.                           - Await pathology results. Procedure Code(s):        --- Professional ---                           [phone], Esophagogastroduodenoscopy, flexible,                            transoral; with biopsy, single or multiple  43236, 59, Esophagogastroduodenoscopy, flexible,                            transoral; with directed submucosal injection(s),                            any substance Diagnosis Code(s):        --- Professional ---                           Q39.9, Congenital malformation of esophagus,                            unspecified                           K44.9, Diaphragmatic hernia without obstruction or                            gangrene                           K31.89, Other diseases of stomach and duodenum                           R13.10, Dysphagia, unspecified CPT copyright 2022 [employer]. All rights reserved. The codes documented in this report are preliminary and upon coder review may  be revised to meet current compliance requirements. [name], MD 02/28/2024 8:36:50 AM This report has been signed electronically. Number of Addenda: 0

## 2024-02-28 NOTE — Anesthesia Postprocedure Evaluation (Signed)
"   Anesthesia Post Note  Patient: Christina Lewis  Procedure(s) Performed: EGD (ESOPHAGOGASTRODUODENOSCOPY) BOTOX  INJECTION BIOPSY, SKIN, SUBCUTANEOUS TISSUE, OR MUCOUS MEMBRANE     Patient location during evaluation: PACU Anesthesia Type: MAC Level of consciousness: awake and alert Pain management: pain level controlled Vital Signs Assessment: post-procedure vital signs reviewed and stable Respiratory status: spontaneous breathing, nonlabored ventilation, respiratory function stable and patient connected to nasal cannula oxygen Cardiovascular status: stable and blood pressure returned to baseline Postop Assessment: no apparent nausea or vomiting Anesthetic complications: no   No notable events documented.  Last Vitals:  Vitals:   02/28/24 0850 02/28/24 0900  BP: (!) 142/84 118/84  Pulse: 78 73  Resp: 12 15  Temp:    SpO2: 100% 97%    Last Pain:  Vitals:   02/28/24 0900  TempSrc:   PainSc: 0-No pain                 Thom JONELLE Peoples      "

## 2024-02-28 NOTE — Transfer of Care (Signed)
 Immediate Anesthesia Transfer of Care Note  Patient: Christina Lewis  Procedure(s) Performed: Procedures: EGD (ESOPHAGOGASTRODUODENOSCOPY) (N/A) BOTOX  INJECTION (N/A) Balloon dilation wire-guided (N/A)  Patient Location: PACU  Anesthesia Type:MAC  Level of Consciousness: Patient easily awoken, comfortable, cooperative, following commands, responds to stimulation.   Airway & Oxygen Therapy: Patient spontaneously breathing, ventilating well, oxygen via simple oxygen mask.  Post-op Assessment: Report given to PACU RN, vital signs reviewed and stable, moving all extremities.   Post vital signs: Reviewed and stable.  Complications: No apparent anesthesia complications  Last Vitals:  Vitals Value Taken Time  BP 128/76 02/28/2024 0835  Temp    Pulse 83 02/28/24 08:35  Resp 24 02/28/24 08:35  SpO2 100 % 02/28/24 08:35  Vitals shown include unfiled device data.  Last Pain:  Vitals:   02/28/24 0735  TempSrc: Temporal  PainSc: 0-No pain         Complications: No notable events documented.

## 2024-02-28 NOTE — Anesthesia Procedure Notes (Signed)
 Procedure Name: MAC Date/Time: 02/28/2024 8:17 AM  Performed by: Joshua Vernell BROCKS, CRNAPre-anesthesia Checklist: Patient identified, Emergency Drugs available, Suction available and Patient being monitored Patient Re-evaluated:Patient Re-evaluated prior to induction Oxygen Delivery Method: Simple face mask Placement Confirmation: positive ETCO2 and breath sounds checked- equal and bilateral Dental Injury: Teeth and Oropharynx as per pre-operative assessment  Comments: Bite block placed by endo staff

## 2024-02-29 ENCOUNTER — Telehealth: Payer: Self-pay

## 2024-02-29 ENCOUNTER — Ambulatory Visit

## 2024-02-29 ENCOUNTER — Other Ambulatory Visit: Payer: Self-pay | Admitting: Rheumatology

## 2024-02-29 DIAGNOSIS — J302 Other seasonal allergic rhinitis: Secondary | ICD-10-CM | POA: Diagnosis not present

## 2024-02-29 LAB — SURGICAL PATHOLOGY

## 2024-02-29 NOTE — Telephone Encounter (Signed)
 Last Fill: 01/26/2024  Next Visit: 04/09/2024  Last Visit: 12/22/2023  DX: DDD (degenerative disc disease), cervical   Current Dose per office note on 12/22/2023: baclofen  10 mg daily as needed and tizanidine  4 mg at bedtime as needed for muscle spasms.   Okay to refill baclofen  and tizanidine ?

## 2024-02-29 NOTE — Telephone Encounter (Signed)
 Spoke with patient stated that she hasn't been taking the Repatha  she went to pick it up and co-pay was $250.00 can't afford. Advised her to keep appointment in Feb 2026, possibly to discuss other options. Patient verbalized understanding and PCP copied. Routed to provider for [facility]

## 2024-02-29 NOTE — Telephone Encounter (Signed)
 Copied from CRM #[id number]. Topic: Clinical - Lab/Test Results >> Feb 29, 2024 10:50 AM Christina Lewis wrote: Reason for CRM: Patient was calling to get the results of her ultrasound. Could you assist? Patients callback number is [phone]. She will not be available for the next hour but can call after 12.

## 2024-02-29 NOTE — Telephone Encounter (Signed)
 Refer to lab results.

## 2024-02-29 NOTE — Telephone Encounter (Signed)
-----   Message from Vishnu Mallipeddi, MD sent at 02/17/2024  1:14 PM EST ----- LDL 162 and TG 258, both elevated. Repatha  started 3 months ago. But on 11/29/23, status shows discontinued by me but I never authorized because patient is not taking. Can you ask her if she  experienced any side effects.

## 2024-03-01 ENCOUNTER — Telehealth: Payer: Self-pay | Admitting: Pharmacy Technician

## 2024-03-01 ENCOUNTER — Other Ambulatory Visit (HOSPITAL_COMMUNITY): Payer: Self-pay

## 2024-03-01 NOTE — Telephone Encounter (Signed)
 Patient Advocate Encounter   The patient was approved for a [employer] grant that will help cover the cost of repatha  Total amount awarded, 2500.00.  Effective: 01/31/24 - 01/29/25   APW:[id number] ERW:[id number] Hmnle:[id number] PI:[id number]  [employer] ID: [id number]   Pharmacy provided with approval and processing information. Patient informed via mychart

## 2024-03-01 NOTE — Telephone Encounter (Addendum)
 Formed signed by provider and faxed to [employer] Patient assistance program this afternoon. Pt aware

## 2024-03-02 NOTE — Progress Notes (Signed)
 Christina Lewis                                          MRN: [id number]   03/02/2024   The [facility] Quality Team Specialist reviewed this patient medical record for the purposes of chart review for care gap closure. The following were reviewed: abstraction for care gap closure-kidney health evaluation for diabetes:eGFR  and uACR.    [facility] Quality Team

## 2024-03-05 ENCOUNTER — Encounter: Payer: Self-pay | Admitting: Family Medicine

## 2024-03-06 ENCOUNTER — Ambulatory Visit: Admitting: Family Medicine

## 2024-03-06 ENCOUNTER — Encounter: Payer: Self-pay | Admitting: Family Medicine

## 2024-03-06 VITALS — BP 125/83 | HR 87 | Temp 97.5°F | Ht 64.0 in | Wt 173.0 lb

## 2024-03-06 DIAGNOSIS — J069 Acute upper respiratory infection, unspecified: Secondary | ICD-10-CM

## 2024-03-06 DIAGNOSIS — J324 Chronic pansinusitis: Secondary | ICD-10-CM | POA: Diagnosis not present

## 2024-03-06 MED ORDER — CEFDINIR 300 MG PO CAPS: 300.0000 mg | ORAL_CAPSULE | Freq: Two times a day (BID) | ORAL | 0 refills | Status: DC

## 2024-03-06 MED ORDER — PREDNISONE 20 MG PO TABS: 40.0000 mg | ORAL_TABLET | Freq: Every day | ORAL | 0 refills | Status: AC

## 2024-03-06 NOTE — Progress Notes (Signed)
 "    Subjective:  Patient ID: Christina Lewis, female    DOB: 1955/05/29, 69 y.o.   MRN: [id number]  Patient Care Team: Severa Rock HERO, FNP as PCP - General (Family Medicine) Stacia Diannah SQUIBB, MD as PCP - Cardiology (Cardiology) Gladis Gearing, OD (Optometry)   Chief Complaint:  Cough, Nasal Congestion, and Headache (X 1 week - otc mucinex and sudafed )   HPI: Christina Lewis is a 69 y.o. female presenting on 03/06/2024 for Cough, Nasal Congestion, and Headache (X 1 week - otc mucinex and sudafed )   Christina Lewis is a 69 year old female with immunosuppression who presents with a persistent cough and sinus issues.  She developed a persistent 'hacky, hacky cough' after her great nephew, who visited during Christmas, became ill with a similar cough. Her nephew tested negative for flu and COVID, and was diagnosed with a viral infection. Subsequently, her sister, brother-in-law, and another sister developed similar symptoms. Her symptoms include a persistent cough and upper respiratory congestion with green nasal discharge, but no fever.  She has a history of immunosuppression and is currently undergoing immune testing, which revealed issues related to staphylococcus. She is in communication with her healthcare providers regarding her immunotherapy and vaccination status, particularly concerning her treatment with Enbrel .  She uses Astelin  daily and a steroid nasal wash. She takes Mucinex every 12 hours and uses an albuterol  inhaler as needed, though not frequently. She recalls having a steroid burst in early December. She has previously been treated with Augmentin  and doxycycline  for sinusitis.          Relevant past medical, surgical, family, and social history reviewed and updated as indicated.  Allergies and medications reviewed and updated. Data reviewed: Chart in Epic.   Past Medical History:  Diagnosis Date   Achilles tendonitis    right foot   Allergy     Anxiety     Arthritis 2007   Cataract 2020   Depression 2012   Eczema    Fibromyalgia    GERD (gastroesophageal reflux disease) 2008   Gout    no current problem   Hematuria    History of kidney stones    passed stones and also had surgery to remove one   History of tics    Hyperlipidemia 2007   Ineffective esophageal motility    tx with prilosec, essophageal was stretched   Interstitial cystitis 2016   Migraines    Osteoarthritis    PCOS (polycystic ovarian syndrome) 1993   Plantar fasciitis, bilateral    Pneumonia 2019   PONV (postoperative nausea and vomiting)    Pre-diabetes    Recurrent upper respiratory infection (URI)    Sleep apnea 1998   inspire device   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, took medication for a year   Thyroid  nodule    Dr Bella is monitoring every year   Type 2 diabetes mellitus in patient with obesity (HCC) 03/04/2021    IMO SNOMED Dx Update Oct 2024    Past Surgical History:  Procedure Laterality Date   ABDOMINAL HYSTERECTOMY  2000   APPENDECTOMY  1976   BALLOON DILATION N/A 11/03/2021   Procedure: MERRILL DILATION;  Surgeon: Saintclair Jasper, MD;  Location: [facility];  Service: Gastroenterology;  Laterality: N/A;   BIOPSY OF SKIN SUBCUTANEOUS TISSUE AND/OR MUCOUS MEMBRANE N/A 02/28/2024   Procedure: BIOPSY, SKIN, SUBCUTANEOUS TISSUE, OR MUCOUS MEMBRANE;  Surgeon: Saintclair Jasper, MD;  Location: [facility];  Service: Gastroenterology;  Laterality: N/A;   BOTOX  INJECTION N/A 11/03/2021   Procedure: BOTOX  INJECTION;  Surgeon: Saintclair Jasper, MD;  Location: [facility];  Service: Gastroenterology;  Laterality: N/A;   BOTOX  INJECTION N/A 02/28/2024   Procedure: BOTOX  INJECTION;  Surgeon: Saintclair Jasper, MD;  Location: [facility];  Service: Gastroenterology;  Laterality: N/A;   BREAST LUMPECTOMY  1989   lt-negative   CARDIOVASCULAR STRESS TEST  2000   CATARACT EXTRACTION W/PHACO Left 12/07/2019   Procedure: CATARACT EXTRACTION PHACO AND  INTRAOCULAR LENS PLACEMENT (IOC);  Surgeon: Harrie Agent, MD;  Location: [facility];  Service: Ophthalmology;  Laterality: Left;  CDE: 5.55   CHOLECYSTECTOMY  1985   COLONOSCOPY     DRUG INDUCED ENDOSCOPY Bilateral 09/29/2022   Procedure: DRUG INDUCED ENDOSCOPY;  Surgeon: Okey Burns, MD;  Location: [facility];  Service: ENT;  Laterality: Bilateral;  15 MINUTES   ESOPHAGEAL MANOMETRY N/A 08/24/2021   Procedure: ESOPHAGEAL MANOMETRY (EM);  Surgeon: Saintclair Jasper, MD;  Location: [facility];  Service: Gastroenterology;  Laterality: N/A;   ESOPHAGOGASTRODUODENOSCOPY N/A 11/03/2021   Procedure: ESOPHAGOGASTRODUODENOSCOPY (EGD);  Surgeon: Saintclair Jasper, MD;  Location: [facility];  Service: Gastroenterology;  Laterality: N/A;   ESOPHAGOGASTRODUODENOSCOPY N/A 02/28/2024   Procedure: EGD (ESOPHAGOGASTRODUODENOSCOPY);  Surgeon: Saintclair Jasper, MD;  Location: [facility];  Service: Gastroenterology;  Laterality: N/A;   EYE SURGERY  2021   HYSTERECTOMY ABDOMINAL WITH SALPINGECTOMY Bilateral 2000   IMPLANTATION OF HYPOGLOSSAL NERVE STIMULATOR Right 11/24/2022   Procedure: IMPLANTATION OF HYPOGLOSSAL NERVE STIMULATOR;  Surgeon: Okey Burns, MD;  Location: [facility];  Service: ENT;  Laterality: Right;   KIDNEY STONE SURGERY Right 2014   with stent placement in OR   KNEE ARTHROSCOPY Right 08/22/2013   Procedure: ARTHROSCOPY RIGHT KNEE FOR INFECTION LAVAGE AND DRAINAGE;  Surgeon: LELON JONETTA Shari Mickey., MD;  Location: [facility];  Service: Orthopedics;  Laterality: Right;   KNEE ARTHROSCOPY WITH PATELLA RECONSTRUCTION Right 08/06/2013   Procedure: RIGHT KNEE ARTHROSCOPY WITH MENISCECTOMY MEDIAL, ARTHROSCOPY KNEE WITH DEBRIDEMENT/SHAVING (CHONDROPLASTY) ;  Surgeon: LELON JONETTA Shari Mickey., MD;  Location: [facility];  Service: Orthopedics;  Laterality: Right;   LUMBAR LAMINECTOMY/DECOMPRESSION MICRODISCECTOMY Right 09/11/2019   Procedure: Right Lumbar Three-Four Microdiscectomy;   Surgeon: Unice Pac, MD;  Location: [facility];  Service: Neurosurgery;  Laterality: Right;  Right Lumbar Three-Four Microdiscectomy   NASAL SINUS SURGERY     x4   OOPHORECTOMY     SHOULDER SURGERY  2011   right shoulder   SPINE SURGERY  2020   THYROID  SURGERY  1994   TONSILLECTOMY Bilateral 1974    Social History   Socioeconomic History   Marital status: Widowed    Spouse name: Elsie   Number of children: Not on file   Years of education: Not on file   Highest education level: Bachelor's degree (e.g., BA, AB, BS)  Occupational History   Occupation: retired  Tobacco Use   Smoking status: Never    Passive exposure: Past   Smokeless tobacco: Never  Vaping Use   Vaping status: Never Used  Substance and Sexual Activity   Alcohol  use: Never   Drug use: Never   Sexual activity: Not Currently    Birth control/protection: Post-menopausal    Comment: Hysterectomy  Other Topics Concern   Not on file  Social History Narrative   Caffiene 4-5 12 oz cans soda.   Working retired - special ed.   Live with home no kids   Social Drivers of  Health   Tobacco Use: Low Risk (03/06/2024)   Patient History    Smoking Tobacco Use: Never    Smokeless Tobacco Use: Never    Passive Exposure: Past  Financial Resource Strain: Low Risk (12/06/2023)   Overall Financial Resource Strain (CARDIA)    Difficulty of Paying Living Expenses: Not hard at all  Food Insecurity: No Food Insecurity (12/06/2023)   Epic    Worried About Programme Researcher, Broadcasting/film/video in the Last Year: Never true    Ran Out of Food in the Last Year: Never true  Transportation Needs: No Transportation Needs (12/06/2023)   Epic    Lack of Transportation (Medical): No    Lack of Transportation (Non-Medical): No  Physical Activity: Inactive (12/06/2023)   Exercise Vital Sign    Days of Exercise per Week: 0 days    Minutes of Exercise per Session: Not on file  Stress: Stress Concern Present (12/06/2023)   [employer] of  Occupational Health - Occupational Stress Questionnaire    Feeling of Stress: To some extent  Social Connections: Moderately Isolated (12/06/2023)   Social Connection and Isolation Panel    Frequency of Communication with Friends and Family: More than three times a week    Frequency of Social Gatherings with Friends and Family: More than three times a week    Attends Religious Services: 1 to 4 times per year    Active Member of [employer] or Organizations: No    Attends Banker Meetings: Not on file    Marital Status: Widowed  Intimate Partner Violence: Not At Risk (02/18/2023)   Humiliation, Afraid, Rape, and Kick questionnaire    Fear of Current or Ex-Partner: No    Emotionally Abused: No    Physically Abused: No    Sexually Abused: No  Depression (PHQ2-9): High Risk (03/06/2024)   Depression (PHQ2-9)    PHQ-2 Score: 13  Alcohol  Screen: Low Risk (02/18/2023)   Alcohol  Screen    Last Alcohol  Screening Score (AUDIT): 0  Housing: Low Risk (12/06/2023)   Epic    Unable to Pay for Housing in the Last Year: No    Number of Times Moved in the Last Year: 0    Homeless in the Last Year: No  Utilities: Not At Risk (02/18/2023)   AHC Utilities    Threatened with loss of utilities: No  Health Literacy: Adequate Health Literacy (02/18/2023)   B1300 Health Literacy    Frequency of need for help with medical instructions: Never    Outpatient Encounter Medications as of 03/06/2024  Medication Sig   albuterol  (VENTOLIN  HFA) 108 (90 Base) MCG/ACT inhaler Inhale 1-2 puffs into the lungs every 6 (six) hours as needed for wheezing or shortness of breath.   azelastine  (ASTELIN ) 0.1 % nasal spray Place 2 sprays into both nostrils 2 (two) times daily. Use in each nostril as directed   baclofen  (LIORESAL ) 10 MG tablet TAKE 1 TABLET BY MOUTH IN THE MORNING AND 1 TABLET BY MOUTH AT NOON   cefdinir  (OMNICEF ) 300 MG capsule Take 1 capsule (300 mg total) by mouth 2 (two) times daily. 1 po BID    cholecalciferol (VITAMIN D ) 25 MCG (1000 UNIT) tablet Take 1,000 Units by mouth daily.   EPINEPHrine  0.3 mg/0.3 mL IJ SOAJ injection Inject 0.3 mg into the muscle as needed for anaphylaxis.   Eptinezumab -jjmr (VYEPTI  IV) Inject 300 mg into the vein every 3 (three) months.   etanercept  (ENBREL  SURECLICK) 50 MG/ML injection Inject 50 mg into the  skin once a week. Labs due in 1 month then every 3 months   folic acid  (FOLVITE ) 1 MG tablet TAKE TWO TABLETS BY MOUTH EVERY DAY   gabapentin  (NEURONTIN ) 100 MG capsule Take 100 mg by mouth daily.   hydroxychloroquine  (PLAQUENIL ) 200 MG tablet TAKE 1 TABLET BY MOUTH TWICE DAILY MONDAY  THRU  FRIDAY  FOR  RHEUMATOID  ARTHRITIS   Lancets (ONETOUCH DELICA PLUS LANCET33G) MISC SMARTSIG:1 Topical Daily   MAGNESIUM  PO Take 500 mg by mouth at bedtime.   methotrexate  50 MG/2ML injection Inject 0.8 mLs (20 mg total) into the skin once a week.   Multiple Vitamin (MULTIVITAMIN WITH MINERALS) TABS tablet Take 1 tablet by mouth daily.   Omega 3 1000 MG CAPS Take 1,000 mg by mouth daily.   omeprazole (PRILOSEC) 20 MG capsule Take 40 mg by mouth daily.   ondansetron  (ZOFRAN -ODT) 4 MG disintegrating tablet Take 1 tablet (4 mg total) by mouth every 8 (eight) hours as needed for nausea.   ONETOUCH VERIO test strip 1 each daily.   Polyvinyl Alcohol -Povidone (REFRESH OP) Apply to eye as needed.   predniSONE  (DELTASONE ) 20 MG tablet Take 2 tablets (40 mg total) by mouth daily with breakfast for 5 days.   Rimegepant Sulfate (NURTEC) 75 MG TBDP Take 1 tablet at the onset of a migraine.  Only 1 tablet in 24 hours   tacrolimus  (PROTOPIC ) 0.1 % ointment Apply topically 2 (two) times daily.   tiZANidine  (ZANAFLEX ) 4 MG tablet TAKE 1 TABLET BY MOUTH AT BEDTIME AS NEEDED   TUBERCULIN SYR 1CC/27GX1/2 (B-D TB SYRINGE 1CC/27GX1/2) 27G X 1/2 1 ML MISC Patient to use to inject Methotrexate  once weekly.   TURMERIC-GINGER -BLACK PEPPER PO Take by mouth.   No facility-administered  encounter medications on file as of 03/06/2024.    Allergies[1]  Pertinent ROS per HPI, otherwise unremarkable      Objective:  BP 125/83   Pulse 87   Temp (!) 97.5 F (36.4 C)   Ht 5' 4 (1.626 m)   Wt 173 lb (78.5 kg)   SpO2 98%   BMI 29.70 kg/m    Wt Readings from Last 3 Encounters:  03/06/24 173 lb (78.5 kg)  02/28/24 170 lb (77.1 kg)  02/21/24 172 lb (78 kg)    Physical Exam Vitals and nursing note reviewed.  Constitutional:      General: She is not in acute distress.    Appearance: Normal appearance. She is well-developed. She is not ill-appearing, toxic-appearing or diaphoretic.  HENT:     Head: Normocephalic and atraumatic.     Right Ear: A middle ear effusion is present.     Left Ear: A middle ear effusion is present.     Nose: Congestion present.     Right Turbinates: Enlarged.     Left Turbinates: Enlarged.     Right Sinus: Maxillary sinus tenderness and frontal sinus tenderness present.     Left Sinus: Maxillary sinus tenderness and frontal sinus tenderness present.     Mouth/Throat:     Lips: Pink.     Mouth: Mucous membranes are moist.     Pharynx: Posterior oropharyngeal erythema and postnasal drip present.  Eyes:     Conjunctiva/sclera: Conjunctivae normal.     Pupils: Pupils are equal, round, and reactive to light.  Cardiovascular:     Rate and Rhythm: Normal rate and regular rhythm.     Heart sounds: Normal heart sounds.  Pulmonary:     Effort: Pulmonary effort is  normal.     Breath sounds: Normal breath sounds.  Musculoskeletal:     Cervical back: Neck supple.  Skin:    General: Skin is warm and dry.     Capillary Refill: Capillary refill takes less than 2 seconds.  Neurological:     General: No focal deficit present.     Mental Status: She is alert and oriented to person, place, and time.  Psychiatric:        Mood and Affect: Mood normal.        Behavior: Behavior normal.        Thought Content: Thought content normal.         Judgment: Judgment normal.      Results for orders placed or performed during the hospital encounter of 02/28/24  Glucose, capillary   Collection Time: 02/28/24  7:40 AM  Result Value Ref Range   Glucose-Capillary 133 (H) 70 - 99 mg/dL  Surgical pathology   Collection Time: 02/28/24  8:22 AM  Result Value Ref Range   SURGICAL PATHOLOGY      SURGICAL PATHOLOGY CASE: [id number] PATIENT: Christina Lewis Surgical Pathology Report     Clinical History: R13.10 dysphagia, R/O H Pylori, R/O EOE (las)     FINAL MICROSCOPIC DIAGNOSIS:  A. GASTRIC ANTRUM AND BODY, BIOPSY: Reactive gastropathy Negative for H. pylori, intestinal metaplasia, dysplasia and carcinoma  B. ESOPHAGUS, BIOPSY: Mildly reactive squamous mucosa Negative for glandular epithelium, eosinophils, dysplasia and carcinoma   GROSS DESCRIPTION:  A.  Received in formalin are tan, soft tissue fragments that are submitted in toto. Number: 4 size: 0.1 to 0.4 cm blocks: 1  B.  Received in formalin are tan, soft tissue fragments that are submitted in toto. Number: 7 size: 0.1 to 0.2 cm blocks: 1 (KW, 02/28/2024)    Final Diagnosis performed by Prentice Pitcher, MD.   Electronically signed 02/29/2024 Technical component performed at [facility], [address], [place], [place] [address].  Professional component performed  at [employer], [address], [place], [place] [address].  Immunohistochemistry Technical component (if applicable) was performed at [facility]. [address], [address], [place], [place] [address].   IMMUNOHISTOCHEMISTRY DISCLAIMER (if applicable): Some of these immunohistochemical stains may have been developed and the performance characteristics determine by [facility]. Some may not have been cleared or approved by the U.S. Food and Drug Administration. The FDA has determined that such clearance or approval is not  necessary. This test is used for clinical purposes. It should not be regarded as investigational or for research. This laboratory is certified under the Clinical Laboratory Improvement Amendments of 1988 (CLIA-88) as qualified to perform high complexity clinical laboratory testing.  The controls stained appropriately.   IHC stains are performed on formalin fixed, paraffin embedded tissue using a 3,3diamin obenzidine (DAB) chromogen and Leica Bond Autostainer System. The staining intensity of the nucleus is score manually and is reported as the percentage of tumor cell nuclei demonstrating specific nuclear staining. The specimens are fixed in 10% Neutral Formalin for at least 6 hours and up to 72hrs. These tests are validated on decalcified tissue. Results should be interpreted with caution given the possibility of false negative results on decalcified specimens. Antibody Clones are as follows ER-clone 29F, PR-clone 16, Ki67- clone MM1. Some of these immunohistochemical stains may have been developed and the performance characteristics determined by [facility].    *Note: Due to a large number of results and/or encounters for  the requested time period, some results have not been displayed. A complete set of results can be found in Results Review.       Pertinent labs & imaging results that were available during my care of the patient were reviewed by me and considered in my medical decision making.  Assessment & Plan:  Amran was seen today for cough, nasal congestion and headache.  Diagnoses and all orders for this visit:  URI with cough and congestion -     predniSONE  (DELTASONE ) 20 MG tablet; Take 2 tablets (40 mg total) by mouth daily with breakfast for 5 days. -     cefdinir  (OMNICEF ) 300 MG capsule; Take 1 capsule (300 mg total) by mouth 2 (two) times daily. 1 po BID  Chronic pansinusitis -     predniSONE  (DELTASONE ) 20 MG tablet; Take 2 tablets (40 mg total) by mouth  daily with breakfast for 5 days. -     cefdinir  (OMNICEF ) 300 MG capsule; Take 1 capsule (300 mg total) by mouth 2 (two) times daily. 1 po BID       Chronic pansinusitis Recurrent sinusitis with recent exacerbation. CT scan of sinuses scheduled to rule out structural abnormalities. Previous treatments include Augmentin  and doxycycline . Cefdinir  chosen to avoid resistance. - Ordered CT scan of sinuses - Prescribed cefdinir  for sinusitis - Prescribed another burst of steroids - Instructed to use albuterol  inhaler every six hours for the next couple of days  Acute upper respiratory infection Recent onset of upper respiratory symptoms including cough and congestion. Symptoms began after exposure to family members with similar symptoms. No fever reported. Symptoms are mostly upper respiratory with occasional coughing. 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 Ongoing discussion with rheumatologist regarding vaccination status and management of immune issues. Coordination with rheumatologist for guidance on post-Enbrel  vaccination protocols. - Continue coordination with rheumatologist for immune management  General health maintenance Discussion regarding pneumonia vaccination and immune status. Coordination with rheumatologist for guidance on post-Enbrel  vaccination protocols. - Continue coordination with rheumatologist for vaccination guidance          Continue all other maintenance medications.  Follow up plan: Return if symptoms worsen or fail to improve.   Continue healthy lifestyle choices, including diet (rich in fruits, vegetables, and lean proteins, and low in salt and simple carbohydrates) and exercise (at least 30 minutes of moderate physical activity daily).  Educational handout given for sinus infection  The above assessment and management plan was  discussed with the patient. The patient verbalized understanding of and has agreed to the management plan. Patient is aware to call the clinic if they develop any new symptoms or if symptoms persist or worsen. Patient is aware when to return to the clinic for a follow-up visit. Patient educated on when it is appropriate to go to the emergency department.   Rosaline Bruns, FNP-C [facility] [phone]     [1]  Allergies Allergen Reactions   Sulfa Antibiotics Anaphylaxis and Other (See Comments)   Sulfamethoxazole-Trimethoprim Anaphylaxis   Atorvastatin Other (See Comments)    Severe Muscle and Joint pain with ALL statins    Codeine Itching and Other (See Comments)   Empagliflozin     Other Reaction(s): Unknown   Erythromycin Nausea And Vomiting   Metronidazole  Diarrhea and Other (See Comments)   Morphine And Codeine Itching   Semaglutide Nausea And Vomiting  Ozempic*   Sitagliptin     Other Reaction(s): Unknown   "

## 2024-03-09 ENCOUNTER — Ambulatory Visit (INDEPENDENT_AMBULATORY_CARE_PROVIDER_SITE_OTHER)

## 2024-03-09 ENCOUNTER — Ambulatory Visit (HOSPITAL_COMMUNITY)
Admission: RE | Admit: 2024-03-09 | Discharge: 2024-03-09 | Disposition: A | Discharge status: Home / Self Care | Source: Ambulatory Visit | Attending: Acute Care | Admitting: Acute Care

## 2024-03-09 DIAGNOSIS — J302 Other seasonal allergic rhinitis: Secondary | ICD-10-CM | POA: Diagnosis not present

## 2024-03-09 DIAGNOSIS — R911 Solitary pulmonary nodule: Secondary | ICD-10-CM | POA: Insufficient documentation

## 2024-03-09 DIAGNOSIS — J324 Chronic pansinusitis: Secondary | ICD-10-CM | POA: Diagnosis present

## 2024-03-13 ENCOUNTER — Other Ambulatory Visit: Payer: Self-pay

## 2024-03-13 ENCOUNTER — Other Ambulatory Visit (HOSPITAL_COMMUNITY): Payer: Self-pay

## 2024-03-13 ENCOUNTER — Encounter (HOSPITAL_COMMUNITY): Payer: Self-pay

## 2024-03-13 ENCOUNTER — Ambulatory Visit

## 2024-03-13 ENCOUNTER — Other Ambulatory Visit: Payer: Self-pay | Admitting: Rheumatology

## 2024-03-13 DIAGNOSIS — Z79899 Other long term (current) drug therapy: Secondary | ICD-10-CM

## 2024-03-13 DIAGNOSIS — M0609 Rheumatoid arthritis without rheumatoid factor, multiple sites: Secondary | ICD-10-CM

## 2024-03-13 MED ORDER — ENBREL SURECLICK 50 MG/ML ~~LOC~~ SOAJ
50.0000 mg | SUBCUTANEOUS | 2 refills | Status: AC
  Filled 2024-03-13 – 2024-03-14 (×2): qty 4, 28d supply, fill #0

## 2024-03-13 NOTE — Telephone Encounter (Signed)
 Last Fill: 01/16/2024  Labs: 02/17/2024 WBC 10.9, Lymphocytes Absolute 4.0 4.0, EOS (Absolute) 0.5, Glucose 118, BUN/ Creat. 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   Okay to refill Enbrel ?

## 2024-03-14 ENCOUNTER — Ambulatory Visit

## 2024-03-14 ENCOUNTER — Other Ambulatory Visit: Payer: Self-pay | Admitting: Pharmacy Technician

## 2024-03-14 ENCOUNTER — Other Ambulatory Visit: Payer: Self-pay

## 2024-03-14 DIAGNOSIS — J302 Other seasonal allergic rhinitis: Secondary | ICD-10-CM

## 2024-03-15 MED ORDER — CEFDINIR 300 MG PO CAPS: 300.0000 mg | ORAL_CAPSULE | Freq: Two times a day (BID) | ORAL | 0 refills | Status: AC

## 2024-03-16 ENCOUNTER — Ambulatory Visit (INDEPENDENT_AMBULATORY_CARE_PROVIDER_SITE_OTHER)

## 2024-03-16 ENCOUNTER — Telehealth: Payer: Self-pay | Admitting: Pharmacist

## 2024-03-16 DIAGNOSIS — J302 Other seasonal allergic rhinitis: Secondary | ICD-10-CM | POA: Diagnosis not present

## 2024-03-16 NOTE — Telephone Encounter (Signed)
 Patient's copay increased. She states she cannot afford copay or $2100 OOP divided into MPPP. Disenrolled from She is interested in [employer] PAP and will complete via [employer]. Emailed app to [facility] to send ot patient  [email or web address]    Provider form printed for signature by Dr. Dolphus  Of note, patient has been advised of extended turnaround time with PAP   Sherry Pennant, PharmD, MPH, BCPS, CPP Clinical Pharmacist

## 2024-03-16 NOTE — Telephone Encounter (Signed)
 I called the patient to schedule appt. I left a message to call the office back.

## 2024-03-16 NOTE — Progress Notes (Signed)
 Disenrolled from [facility]- clinic pharmacy team to follow

## 2024-03-20 ENCOUNTER — Ambulatory Visit: Admitting: Acute Care

## 2024-03-20 ENCOUNTER — Encounter: Payer: Self-pay | Admitting: Acute Care

## 2024-03-20 VITALS — BP 127/69 | HR 95 | Temp 98.0°F | Ht 64.0 in | Wt 177.4 lb

## 2024-03-20 DIAGNOSIS — Z0000 Encounter for general adult medical examination without abnormal findings: Secondary | ICD-10-CM

## 2024-03-20 DIAGNOSIS — D849 Immunodeficiency, unspecified: Secondary | ICD-10-CM | POA: Diagnosis not present

## 2024-03-20 DIAGNOSIS — R918 Other nonspecific abnormal finding of lung field: Secondary | ICD-10-CM

## 2024-03-20 DIAGNOSIS — R911 Solitary pulmonary nodule: Secondary | ICD-10-CM

## 2024-03-20 DIAGNOSIS — G4734 Idiopathic sleep related nonobstructive alveolar hypoventilation: Secondary | ICD-10-CM

## 2024-03-20 DIAGNOSIS — R0609 Other forms of dyspnea: Secondary | ICD-10-CM | POA: Diagnosis not present

## 2024-03-20 DIAGNOSIS — R9389 Abnormal findings on diagnostic imaging of other specified body structures: Secondary | ICD-10-CM | POA: Diagnosis not present

## 2024-03-20 NOTE — Progress Notes (Signed)
 "  History of Present Illness Christina Lewis is a 69 y.o. female never smoker originally seen for a lung nodule, now following up after PFT's for shortness of breath. She is followed by Dr. Shelah and Lauraine Lites NP.   Synopsis Christina Lewis is a 69 year old female followed for a pulmonary nodule. While here for the nodule work up she discussed some intermittent shortness of breath. We did PFT's, she is here to review the results. She does endorse that the shortness of breath is worse at night, which is also when she has esophageal spasms. She takes 40 mg of Prilosec in the morning and at night for possible reflux but does not elevate her head during sleep.  She also has chronic sinus issues, and is seen by an allergist for this. She has an Inspire device that she has monitored by [employer]. PFT's were relatively normal, with the exception of high DLCO. No obstruction, no restriction. She does have a good BD ( +14) response in the mid expiratory flow rate, medium to small airways. We prescribed an albuterol  inhaler . She is hre for follow up CT Chest to ensure the pulmonary nodule is stable, and to see how she is doing on her albuterol .   03/20/2024 Discussed the use of AI scribe software for clinical note transcription with the patient, who gave verbal consent to proceed.  History of Present Illness Pt. Presents for follow up. She states she has been doing well.    Christina Lewis is a 69 year old female with immunosuppression followed for pulmonary nodule surveillance , dyspnea and today has concerns about nighttime oxygen desaturations she noted on her oxygen saturation monitor.She has an Inspire device for her OSA that is managed by [employer].  She experiences significant drops in oxygen saturation levels at night, with readings falling into the sixties. These desaturations occur while she is awake and lying in bed, and have become more pronounced over the past few months. She uses an Inspire implant as  an alternative to CPAP therapy.  She has a history of COVID-19 infection in 2020, complicated by pneumonia, and has experienced ongoing respiratory issues since then. Her previous primary care physician suggested these might be related to long COVID. She uses albuterol  as needed, especially during activities that typically cause shortness of breath, although she is uncertain of its effectiveness.  She is currently on two immunosuppressant medications: Enbrel  and methotrexate , both injectable. She has a history of stable pulmonary nodules, with a recent CT Chest 02/2024  scan showing no acute changes and stable nodules. We will do a 1 year follow up surveillance to follow these for 2 years. She is in agreement with this plan.   She is currently on antibiotics following a sinus CT that showed some abnormalities, and she is scheduled to follow up with her allergist/immunologist  regarding her immune status and potential need for additional vaccinations.     Test Results: CT Chest 03/14/2024 Lungs/Pleura: No pleural effusion or pneumothorax. Interval improved aeration of both lung bases. There is no confluent airspace disease or suspicious pulmonary nodule. There are scattered tiny nodules, including a 3 mm left lower lobe nodule on image 97/4 which is unchanged from 2018, and a punctate right middle lobe nodule on image 71/4.  MPRESSION: 1. No acute chest findings or suspicious pulmonary nodules. 2. Stable scattered tiny pulmonary nodules, likely benign. 3. Stable moderate-size hiatal hernia. 4. Probable hepatic steatosis. 5.  Aortic Atherosclerosis (ICD10-I70.0).  Latest Ref Rng & Units 02/17/2024    2:37 PM 02/08/2024   11:13 AM 12/22/2023   10:07 AM  CBC  WBC 3.4 - 10.8 x10E3/uL 10.9  10.1  10.8   Hemoglobin 11.1 - 15.9 g/dL 85.7  86.2  85.4   Hematocrit 34.0 - 46.6 % 42.6  41.8  43.8   Platelets 150 - 450 x10E3/uL 442  316  379        Latest Ref Rng & Units 02/17/2024    2:37  PM 12/22/2023   10:07 AM 08/15/2023   11:06 AM  BMP  Glucose 70 - 99 mg/dL 881  889  881   BUN 8 - 27 mg/dL 21  19  21    Creatinine 0.57 - 1.00 mg/dL 9.30  9.27  9.27   BUN/Creat Ratio 12 - 28 30  SEE NOTE:  SEE NOTE:   Sodium 134 - 144 mmol/L 141  140  139   Potassium 3.5 - 5.2 mmol/L 4.2  4.5  4.6   Chloride 96 - 106 mmol/L 102  106  103   CO2 20 - 29 mmol/L 23  25  28    Calcium 8.7 - 10.3 mg/dL 89.7  9.7  9.9     BNP No results found for: BNP  ProBNP No results found for: PROBNP  PFT    Component Value Date/Time   FEV1PRE 2.82 01/02/2024 0941   FEV1POST 2.93 01/02/2024 0941   FVCPRE 3.15 01/02/2024 0941   FVCPOST 3.33 01/02/2024 0941   TLC 6.39 01/02/2024 0941   DLCOUNC 23.73 01/02/2024 0941   PREFEV1FVCRT 89 01/02/2024 0941   PSTFEV1FVCRT 88 01/02/2024 0941    CT MAXILLOFACIAL WO CONTRAST Result Date: 03/14/2024 EXAM: CT Sinus without contrast 03/09/2024 09:17:54 AM TECHNIQUE: CT sinuses without contrast. Dose reduction techniques were achieved by using automated exposure control and/or adjustment of mA and/or kV according to patient size and/or use of iterative reconstruction technique. COMPARISON: MRI head 12/05/2023 and CT sinus 11/18/2022 CLINICAL HISTORY: Chronic congestion. FINDINGS: FRONTAL: Left frontal sinus outflow tract shows mild mucosal thickening. The right frontal sinus is clear. The left frontoethmoidal recess is affected by mucosal thickening. The right frontoethmoidal recess is patent. MAXILLARY: Moderate mucosal thickening is present in the left maxillary sinus, most pronounced in the inferior and lateral aspects, similar to the prior MRI but increased since 2024. Mild mucosal thickening and layering secretions are noted posteriorly within the right maxillary sinus. There is thickening of the maxillary sinus wall suggestive of chronic sinusitis. Postsurgical changes of bilateral maxillary antrostomy and uncinectomy are present. The ostiomeatal complexes  are affected by mucosal thickening and postsurgical changes. ETHMOID: Mild mucosal thickening is present in the left anterior ethmoid air cells. There is pneumatization noted superior to the anterior ethmoid notches bilaterally. The ethmoid roofs are symmetric. The lamina papyracea are intact. The optic nerve canals are intact. SPHENOID: Clear. The sphenoethmoid recesses are patent. The carotid canals are intact. NASAL CAVITY: Mucosal thickening is noted extending from the left maxillary sinus partially into the nasal cavity. Question partial resection of the bilateral inferior turbinates. Nasal septum is midline. OTHER: The mastoid air cells and middle ear cavities are well aerated. No acute soft tissue abnormality. Degenerative changes of the temporomandibular joints. IMPRESSION: 1. Chronic left maxillary sinusitis, with moderate mucosal thickening increased since 2024. 2. Mild mucosal thickening and layering secretions in the right maxillary sinus. 3. Mild mucosal thickening in the left anterior ethmoid air cells and left frontal sinus outflow tract. 4. Postsurgical  changes of bilateral maxillary antrostomy and uncinectomy. Electronically signed by: Donnice Mania MD 03/14/2024 05:10 PM EST RP Workstation: [id number]   CT CHEST WO CONTRAST Result Date: 03/14/2024 CLINICAL DATA:  Follow up pulmonary nodule seen on previous imaging study. High cancer risk. * Tracking Code: BO * EXAM: CT CHEST WITHOUT CONTRAST TECHNIQUE: Multidetector CT imaging of the chest was performed following the standard protocol without IV contrast. RADIATION DOSE REDUCTION: This exam was performed according to the departmental dose-optimization program which includes automated exposure control, adjustment of the mA and/or kV according to patient size and/or use of iterative reconstruction technique. COMPARISON:  Cardiac CT 09/02/2023.  Abdominal CT 12/28/2016. FINDINGS: Cardiovascular: Mild aortic and coronary artery atherosclerosis. The  heart size is normal. There is no pericardial effusion. Mediastinum/Nodes: There are no enlarged mediastinal, hilar or axillary lymph nodes.Hilar assessment is limited by the lack of intravenous contrast, although the hilar contours appear unchanged. Stable moderate-size hiatal hernia. Lungs/Pleura: No pleural effusion or pneumothorax. Interval improved aeration of both lung bases. There is no confluent airspace disease or suspicious pulmonary nodule. There are scattered tiny nodules, including a 3 mm left lower lobe nodule on image 97/4 which is unchanged from 2018, and a punctate right middle lobe nodule on image 71/4. Upper abdomen: Probable hepatic steatosis. Previous cholecystectomy. No acute findings. Musculoskeletal/Chest wall: There is no chest wall mass or suspicious osseous finding. Right-sided hypoglossal nerve stimulator noted. IMPRESSION: 1. No acute chest findings or suspicious pulmonary nodules. 2. Stable scattered tiny pulmonary nodules, likely benign. 3. Stable moderate-size hiatal hernia. 4. Probable hepatic steatosis. 5.  Aortic Atherosclerosis (ICD10-I70.0). Electronically Signed   By: Elsie Perone M.D.   On: 03/14/2024 12:00   US  ARTERIAL ABI (SCREENING LOWER EXTREMITY) Result Date: 02/26/2024 CLINICAL DATA:  Decreased sensation and numbness of both lower extremities and feet, left greater than right. History of diabetes with associated neuropathy. EXAM: NONINVASIVE PHYSIOLOGIC VASCULAR STUDY OF BILATERAL LOWER EXTREMITIES TECHNIQUE: Evaluation of both lower extremities were performed at rest, including calculation of ankle-brachial indices with single level Doppler, pressure and pulse volume recording. COMPARISON:  None Available. FINDINGS: Right ABI:  1.15 Left ABI:  1.19 Right Lower Extremity: Triphasic posterior tibial and biphasic dorsalis pedis waveforms. Left Lower Extremity: Triphasic posterior tibial and dorsalis pedis waveforms. 1.0-1.4 Normal IMPRESSION: Normal bilateral resting  ankle-brachial indices and unremarkable distal waveforms. Electronically Signed   By: Marcey Moan M.D.   On: 02/26/2024 10:20     Past medical hx Past Medical History:  Diagnosis Date   Achilles tendonitis    right foot   Allergy     Anxiety    Arthritis 2007   Cataract 2020   Depression 2012   Eczema    Fibromyalgia    GERD (gastroesophageal reflux disease) 2008   Gout    no current problem   Hematuria    History of kidney stones    passed stones and also had surgery to remove one   History of tics    Hyperlipidemia 2007   Ineffective esophageal motility    tx with prilosec, essophageal was stretched   Interstitial cystitis 2016   Migraines    Osteoarthritis    PCOS (polycystic ovarian syndrome) 1993   Plantar fasciitis, bilateral    Pneumonia 2019   PONV (postoperative nausea and vomiting)    Pre-diabetes    Recurrent upper respiratory infection (URI)    Sleep apnea 1998   inspire device   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 She has been exposed to tobacco smoke. She has never used smokeless tobacco. She reports that she does not drink alcohol  and does not use drugs.  Tobacco Cessation: Counseling given: Not Answered Never smoker   Past surgical hx, Family hx, Social hx all reviewed.  Current Outpatient Medications on File Prior to Visit  Medication Sig   albuterol  (VENTOLIN  HFA) 108 (90 Base) MCG/ACT inhaler Inhale 1-2 puffs into the lungs every 6 (six) hours as needed for wheezing or shortness of breath.   azelastine  (ASTELIN ) 0.1 % nasal spray Place 2 sprays into both nostrils 2 (two) times daily. Use in each nostril as directed   baclofen  (LIORESAL ) 10 MG tablet TAKE 1 TABLET BY  MOUTH IN THE MORNING AND 1 TABLET BY MOUTH AT NOON   budesonide  (PULMICORT ) 0.5 MG/2ML nebulizer solution USE 1 VIAL IN NEBULIZER IN THE MORNING AND AT BEDTIME. MIX 1 VIAL WITH 250 CC (ML) OF NORMAL SALINE FOR SINUS WASH DAILY AS NEEDED   cefdinir  (OMNICEF ) 300 MG capsule Take 1 capsule (300 mg total) by mouth 2 (two) times daily for 14 days.   cholecalciferol (VITAMIN D ) 25 MCG (1000 UNIT) tablet Take 1,000 Units by mouth daily.   EMBECTA PEN NEEDLE NANO 2 GEN 32G X 4 MM MISC SMARTSIG:1 pen needle Daily   EPINEPHrine  0.3 mg/0.3 mL IJ SOAJ injection Inject 0.3 mg into the muscle as needed for anaphylaxis.   Eptinezumab -jjmr (VYEPTI  IV) Inject 300 mg into the vein every 3 (three) months.   etanercept  (ENBREL  SURECLICK) 50 MG/ML injection Inject 50 mg into the skin once a week.   folic acid  (FOLVITE ) 1 MG tablet TAKE TWO TABLETS BY MOUTH EVERY DAY   gabapentin  (NEURONTIN ) 100 MG capsule Take 100 mg by mouth daily.   hydroxychloroquine  (PLAQUENIL ) 200 MG tablet TAKE 1 TABLET BY MOUTH TWICE DAILY MONDAY  THRU  FRIDAY  FOR  RHEUMATOID  ARTHRITIS   hydrOXYzine  (ATARAX ) 10 MG tablet Take 10 mg by mouth at bedtime.   insulin  glargine (LANTUS) 100 UNIT/ML injection    Lancets (ONETOUCH DELICA PLUS LANCET33G) MISC SMARTSIG:1 Topical Daily   MAGNESIUM  PO Take 500 mg by mouth at bedtime.   methotrexate  50 MG/2ML injection Inject 0.8 mLs (20 mg total) into the skin once a week.   Multiple Vitamin (MULTIVITAMIN WITH MINERALS) TABS tablet Take 1 tablet by mouth daily.   Omega 3 1000 MG CAPS Take 1,000 mg by mouth daily.   omeprazole (PRILOSEC) 20 MG capsule Take 40 mg by mouth daily.   ondansetron  (ZOFRAN -ODT) 4 MG disintegrating tablet Take 1 tablet (4 mg total) by mouth every 8 (eight) hours as needed for nausea.   ONETOUCH VERIO test strip 1 each daily.   Polyvinyl Alcohol -Povidone (REFRESH OP) Apply to eye as needed.   REPATHA  SURECLICK 140 MG/ML SOAJ    Rimegepant Sulfate (NURTEC) 75 MG TBDP Take 1  tablet at the onset of a migraine.  Only 1 tablet in 24 hours   tacrolimus  (PROTOPIC ) 0.1 % ointment Apply topically 2 (two) times daily.   tiZANidine  (ZANAFLEX ) 4 MG tablet TAKE 1 TABLET BY MOUTH AT BEDTIME AS NEEDED   TUBERCULIN SYR 1CC/27GX1/2 (B-D TB SYRINGE 1CC/27GX1/2) 27G  X 1/2 1 ML MISC Patient to use to inject Methotrexate  once weekly.   TURMERIC-GINGER -BLACK PEPPER PO Take by mouth.   No current facility-administered medications on file prior to visit.     Allergies[2]  Review Of Systems:  Constitutional:   No  weight loss, night sweats,  Fevers, chills, fatigue, or  lassitude.  HEENT:   No headaches,  Difficulty swallowing,  Tooth/dental problems, or  Sore throat,                No sneezing, itching, ear ache, +nasal congestion, +post nasal drip,   CV:  No chest pain,  Orthopnea, PND, swelling in lower extremities, anasarca, dizziness, palpitations, syncope.   GI  No heartburn, indigestion, abdominal pain, nausea, vomiting, diarrhea, change in bowel habits, loss of appetite, bloody stools.   Resp: + shortness of breath with exertion less at rest.  No excess mucus, no productive cough,  No non-productive cough,  No coughing up of blood.  No change in color of mucus.  No wheezing.  No chest wall deformity  Skin: no rash or lesions.  GU: no dysuria, change in color of urine, no urgency or frequency.  No flank pain, no hematuria   MS:  No joint pain or swelling.  No decreased range of motion.  No back pain.  Psych:  No change in mood or affect. No depression or anxiety.  No memory loss.   Vital Signs BP 127/69   Pulse 95   Temp 98 F (36.7 C) (Oral)   Ht 5' 4 (1.626 m)   Wt 177 lb 6.4 oz (80.5 kg)   SpO2 96%   BMI 30.45 kg/m    Physical Exam: Physical Exam GENERAL: No distress, alert and oriented times 3. EARS NOSE THROAT: No sinus tenderness, tympanic membranes clear, pale nasal mucosa, no oral exudate, no post nasal drip, no lymphadenopathy. CHEST: Lungs  clear to auscultation bilaterally. No wheeze, rales, dullness, no accessory muscle use, no nasal flaring, no sternal retractions. CARDIAC: S1, S2, regular rate and rhythm, no murmur. ABDOMINAL: Soft, non tender. ND, BS present,Body mass index is 30.45 kg/m.  EXTREMITIES: No clubbing, cyanosis, edema. No obvious deformities. NEUROLOGICAL: Normal strength. Alert and oriented x 3, MAE x 4. SKIN: No rashes, warm and dry. No obvious skin lesions. PSYCHIATRIC: Normal mood and behavior.   Assessment/Plan Assessment & Plan Suspected Nocturnal hypoxemia Oxygen saturation dropping to the 60s at night per patient , potentially related to long COVID . Currently using an Inspire implant for  OSA, managed by [facility] Plan - Ordered overnight oximetry test with Inspire on to assess oxygen levels. - If desaturation occurs, will consider adjustments to [facility] settings by [facility]  or initiation of nocturnal oxygen therapy.  Stable pulmonary nodules Pulmonary nodules are stable with no acute chest findings or suspicious nodules. Likely benign. - Scheduled follow-up CT scan in January 2027 to monitor pulmonary nodules x 2 years. - Follow up 1-2 weeks after the scan to review results.  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 Albuterol  use is recommended, especially before exertion, to prevent shortness of breath. - Continue albuterol  use, especially before exertion. - Consider pre-medicating with albuterol  before activities that may cause shortness of breath. - Call for any worsening of dyspnea  Immunosuppressed patient  Healthcare Maintenence - Follow up with immunology regarding vaccinations    I spent 20 minutes dedicated to the care of this patient on the date of this encounter  to include pre-visit review of records, face-to-face time with the patient discussing conditions above, post visit ordering of testing, clinical  documentation with the electronic health record, making appropriate referrals as documented, and communicating necessary information to the patient's healthcare team.   Lauraine JULIANNA Lites, NP 03/20/2024  11:57 AM             [1]  Social History Tobacco Use   Smoking status: Never    Passive exposure: Past   Smokeless tobacco: Never  Vaping Use   Vaping status: Never Used  Substance Use Topics   Alcohol  use: Never   Drug use: Never  [2]  Allergies Allergen Reactions   Sulfa Antibiotics Anaphylaxis and Other (See Comments)   Sulfamethoxazole-Trimethoprim Anaphylaxis   Atorvastatin Other (See Comments)    Severe Muscle and Joint pain with ALL statins    Codeine Itching and Other (See Comments)   Empagliflozin     Other Reaction(s): Unknown   Erythromycin Nausea And Vomiting   Metronidazole  Diarrhea and Other (See Comments)   Morphine And Codeine Itching   Semaglutide Nausea And Vomiting    Ozempic*   Sitagliptin     Other Reaction(s): Unknown   "

## 2024-03-20 NOTE — Patient Instructions (Addendum)
 It is good to see you today. I am glad you have been doing well. We will order an overnight oximetry test to ensure that you are not dropping your oxygen levels at nighttime when you sleep. You will get a call to get this scheduled. Once the results are and we will set up a video visit so we can go over the results and determine if you need nocturnal oxygen. This will also be useful for the inspire team who manages your inspire through [place]. It may be that they can adjust the settings to ensure improved oxygenation at night. CT chest shows stable nodules. We will do a 1 year follow-up to ensure these remain stable. Continue using your albuterol  as needed for shortness of breath or wheezing. Use 1 to 2 puffs as needed for shortness of breath or wheezing. Consider using this before you exert yourself to see if it prevents exertion. Follow-up with immunology regarding your immune workup. Follow-up with Magen Suriano NP  in 1 year to review the results of your CT chest Call for any unexplained weight loss or blood when you cough. Please contact office for sooner follow up if symptoms do not improve or worsen or seek emergency care

## 2024-03-20 NOTE — Telephone Encounter (Signed)
 Called and got the patient rescheduled to see [facility] tomorrow in [place].

## 2024-03-20 NOTE — Telephone Encounter (Signed)
 Application has been sent to pt via [employer], will await it's return.

## 2024-03-20 NOTE — Progress Notes (Signed)
 "  [address] [place] [address] Dept: [phone]  FOLLOW UP NOTE  Patient ID: Christina Lewis, female    DOB: 1956-02-08  Age: 69 y.o. MRN: [id number] Date of Office Visit: 03/21/2024  Assessment  Chief Complaint: Follow-up (F/u, discuss lab results)  HPI Christina Lewis is a 69 year old female who presents to the clinic for follow-up visit.  She was last seen in this clinic on 02/08/2024 by Dr. Iva for evaluation of allergic rhinitis, recurrent infection, and immunosuppressed status on Enbrel  for rheumatoid arthritis.  Discussed the use of AI scribe software for clinical note transcription with the patient, who gave verbal consent to proceed.  History of Present Illness Christina Lewis is a 69 year old female with chronic sinus issues and immune system concerns who presents with persistent nasal congestion.  She experiences chronic nasal congestion characterized by a persistent sniffing and snorting sensation. There is a feeling of needing to blow her nose without any discharge, although occasionally she can expel something clear after using saline spray. She uses a budesonide  steroid nasal wash every morning, which provides limited relief. Despite being on Cefdinir  for almost two weeks, she continues to expel stringy green mucus occasionally. She has a history of sinus problems and has undergone numerous sinus surgeries. Her symptoms significantly worsened after contracting COVID-19 in 2020, which led to severe pneumonia. 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  She continues to follow-up with Christina Lewis, he ENT specialist with [facility] with her last appointment on 02/21/2024.  She is interested in testing the top most allergenic foods for any connection to her sinus issues.  She has a history of environmental allergies, including to tree  pollens and grasses, and has been receiving allergy  shots since November. She has not experienced any adverse reactions to the shots but is uncertain about their effectiveness as she recently started injections directed toward pollens and pets on 12/07/2023. She has lived with cats for over thirty years and is allergic to dogs, which causes facial symptoms when exposed.  She reports longstanding history of recurrent infection requiring antibiotic and steroid, specifically sinus infections.  She does have a history of pneumonia following COVID 10/12/2018.  Immune screening on 02/08/2024 indicated slightly decreased IgA, normal levels of IgG and IgM, protective levels to diphtheria and tetanus, and 7 out of 23 protective pneumococcal serotypes.  I recommendation was made for either a PPSV23 or Prevnar 20 vaccine. She continues Enbrel  and for that reason hs not moved forward with the pneumococcal vaccines. After a detailed discussion, she feels ready to move forward with the vaccine. She is currently on a 3 week round of cefdinir  for sinusitis  Her current medications are listed in the chart.  Of note, at least 50% of the visit was spent discussing lab work results and how to proceed with an immunosuppressant and pneumococcal vaccine.  PROCEDURE: Bilateral Diagnostic Rigid Nasal Endoscopy with LEFT Debridement Pre-procedure diagnosis: Post-operative examination and care after bilateral Functional Endoscopic Sinus Surgery - of note: this procedure was NOT performed for management of the septoplasty or the turbinate reduction. Post-procedure diagnosis: same Indication: See pre-procedure diagnosis and physical exam above Complications: None apparent EBL: 0 mL Anesthesia: Lidocaine  4% and topical decongestant was topically sprayed in each nasal cavity   Description of Procedure:  Patient was identified. A rigid 30 degree endoscope was utilized to evaluate the sinonasal  cavities, mucosa, sinus ostia and  turbinates and septum.  Overall, signs of mucosal inflammation are noted. Also noted are post-surgical changes with some crusting within bilateral middle meati but left obstructive -- some thick eosinophilic type secretions as well; some medialization left MT; These were debrided with a 8 Fr suction. After debridement, sinus cavity patency was much improved. No adverse synechiae are noted. Right Middle meatus: clear Right SE Recess: clear Left MM: see above Left SE Recess: clear   Electronically signed by: Christina KATHEE Blanch, MD 02/21/2024 8:33 AM  Drug Allergies:  Allergies[1]  Physical Exam: BP 130/84   Pulse 82   Temp 97.7 F (36.5 C)   Wt 175 lb 4 oz (79.5 kg)   SpO2 97%   BMI 30.08 kg/m    Physical Exam Vitals reviewed.  Constitutional:      Appearance: Normal appearance.  HENT:     Head: Normocephalic and atraumatic.     Right Ear: Tympanic membrane normal.     Left Ear: Tympanic membrane normal.     Nose:     Comments: Bilateral nares slightly erythematous with thin clear nasal drainage noted.  Pharynx normal.  Ears normal.  Eyes normal.    Mouth/Throat:     Pharynx: Oropharynx is clear.  Eyes:     Conjunctiva/sclera: Conjunctivae normal.  Cardiovascular:     Rate and Rhythm: Normal rate and regular rhythm.     Heart sounds: Normal heart sounds. No murmur heard. Pulmonary:     Effort: Pulmonary effort is normal.     Breath sounds: Normal breath sounds.     Comments: Lungs clear to auscultation Musculoskeletal:        General: Normal range of motion.     Cervical back: Normal range of motion and neck supple.  Skin:    General: Skin is warm and dry.  Neurological:     Mental Status: She is alert and oriented to person, place, and time.  Psychiatric:        Mood and Affect: Mood normal.        Behavior: Behavior normal.        Thought Content: Thought content normal.        Judgment: Judgment normal.     Assessment and Plan: 1. Allergy  with anaphylaxis due to  food   2. Seasonal and perennial allergic rhinitis   3. Chronic migraine w/o aura w/o status migrainosus, not intractable   4. Recurrent infections     Meds ordered this encounter  Medications   levocetirizine (XYZAL ) 5 MG tablet    Sig: Take 1 tablet (5 mg total) by mouth every evening.    Dispense:  30 tablet    Refill:  5    Patient Instructions  Allergic rhinitis Continue allergen avoidance measures directed toward grass pollen, weed pollen, tree pollen, outdoor mold, and cat as listed below Continue allergen immunotherapy and have access to an Epipen  set Consider montelukast  10 mg once a day to prevent allergy  symptoms Consider levocetirizine 5 mg once a day if needed for nasal symptoms. 1 to 2 tablets once or twice a day if needed for nasal symptoms Continue budesonide  nasal rinses up to twice a day if needed for stuffy nose Continue azelastine  2 sprays in each nostril up to twice a day if needed Consider saline nasal rinses as needed for nasal symptoms. Use this before any medicated nasal sprays for best result  Possible food allergy  A lab order has been placed to help us   evaluate for food allergies. We will call you when the results become available  Migraine Continue follow-up with your neurologist for evaluation and treatment of migraine  Recurrent infections Continue to monitor infections, antibiotic use, and steroid use. Get either PPSV23 or Prevnar 20 vaccine.  Let us  know when you get it and we will draw follow-up titers in 4 to 6 weeks after you receive the injection.  Call the clinic if this treatment plan is not working well for you.  Follow up in 6 months or sooner if needed.  No follow-ups on file.    Thank you for the opportunity to care for this patient.  Please do not hesitate to contact me with questions.  Christina Mutter, FNP [facility]          [1]  Allergies Allergen Reactions   Sulfa Antibiotics Anaphylaxis  and Other (See Comments)   Sulfamethoxazole-Trimethoprim Anaphylaxis   Atorvastatin Other (See Comments)    Severe Muscle and Joint pain with ALL statins    Codeine Itching and Other (See Comments)   Empagliflozin     Other Reaction(s): Unknown   Erythromycin Nausea And Vomiting   Metronidazole  Diarrhea and Other (See Comments)   Morphine And Codeine Itching   Semaglutide Nausea And Vomiting    Ozempic*   Sitagliptin     Other Reaction(s): Unknown   "

## 2024-03-20 NOTE — Patient Instructions (Incomplete)
 Allergic rhinitis Continue allergen avoidance measures directed toward grass pollen, weed pollen, tree pollen, outdoor mold, and cat as listed below Continue montelukast  10 mg once a day to prevent allergy  symptoms Continue carbinoxamine  4 mg tablets 1 to 2 tablets once or twice a day if needed for nasal symptoms Continue budesonide  nasal rinses up to twice a day if needed for stuffy nose Continue ipratropium nasal spray 2 sprays in each nostril up to 3 times a day if needed for runny nose Consider saline nasal rinses as needed for nasal symptoms. Use this before any medicated nasal sprays for best result Consider allergen immunotherapy if your symptoms are not well-controlled with the treatment plan as listed above. Written information provided for traditional allergen immunotherapy and RUSH therapy. Call your insurance first to find out about coverage. If interested call the clinic and make an appointment for about 3 weeks from now.   Migraine Continue follow-up with your neurologist for evaluation and treatment of migraine  Call the clinic if this treatment plan is not working well for you.  Follow up in 4 months or sooner if needed.  Reducing Pollen Exposure The American Academy of All[employer] , Ast[employer] Immun[employer] suggests the following steps to reduce your exposure to pollen during allergy  seasons. Do not hang sheets or clothing out to dry; pollen may collect on these items. Do not mow lawns or spend time around freshly cut grass; mowing stirs up pollen. Keep windows closed at night.  Keep car windows closed while driving. Minimize morning activities outdoors, a time when pollen counts are usually at their highest. Stay indoors as much as possible when pollen counts or humidity is high and on windy days when pollen tends to remain in the air longer. Use air conditioning when possible.  Many air conditioners have filters that trap the pollen spores. Use a HEPA room air filter to remove  pollen form the indoor air you breathe.  Control of Mold Allergen Mold and fungi can grow on a variety of surfaces provided certain temperature and moisture conditions exist.  Outdoor molds grow on plants, decaying vegetation and soil.  The major outdoor mold, Alternaria and Cladosporium, are found in very high numbers during hot and dry conditions.  Generally, a late Summer - Fall peak is seen for common outdoor fungal spores.  Rain will temporarily lower outdoor mold spore count, but counts rise rapidly when the rainy period ends.  The most important indoor molds are Aspergillus and Penicillium.  Dark, humid and poorly ventilated basements are ideal sites for mold growth.  The next most common sites of mold growth are the bathroom and the kitchen.  Outdoor [employer] Use air conditioning and keep windows closed Avoid exposure to decaying vegetation. Avoid leaf raking. Avoid grain handling. Consider wearing a face mask if working in moldy areas.  Indoor Mold Control Maintain humidity below 50%. Clean washable surfaces with 5% bleach solution. Remove sources e.g. Contaminated carpets.  Control of Dog or Cat Allergen Avoidance is the best way to manage a dog or cat allergy . If you have a dog or cat and are allergic to dog or cats, consider removing the dog or cat from the home. If you have a dog or cat but don't want to find it a new home, or if your family wants a pet even though someone in the household is allergic, here are some strategies that may help keep symptoms at bay:  Keep the pet out of your bedroom and restrict it  to only a few rooms. Be advised that keeping the dog or cat in only one room will not limit the allergens to that room. Don't pet, hug or kiss the dog or cat; if you do, wash your hands with soap and water. High-efficiency particulate air (HEPA) cleaners run continuously in a bedroom or living room can reduce allergen levels over time. Regular use of a high-efficiency  vacuum cleaner or a central vacuum can reduce allergen levels. Giving your dog or cat a bath at least once a week can reduce airborne allergen.

## 2024-03-21 ENCOUNTER — Encounter: Payer: Self-pay | Admitting: Family Medicine

## 2024-03-21 ENCOUNTER — Ambulatory Visit: Admitting: Family Medicine

## 2024-03-21 ENCOUNTER — Ambulatory Visit

## 2024-03-21 VITALS — BP 130/84 | HR 82 | Temp 97.7°F | Wt 175.2 lb

## 2024-03-21 DIAGNOSIS — T7800XA Anaphylactic reaction due to unspecified food, initial encounter: Secondary | ICD-10-CM

## 2024-03-21 DIAGNOSIS — J302 Other seasonal allergic rhinitis: Secondary | ICD-10-CM

## 2024-03-21 DIAGNOSIS — T7800XD Anaphylactic reaction due to unspecified food, subsequent encounter: Secondary | ICD-10-CM | POA: Diagnosis not present

## 2024-03-21 DIAGNOSIS — J3089 Other allergic rhinitis: Secondary | ICD-10-CM | POA: Diagnosis not present

## 2024-03-21 DIAGNOSIS — B999 Unspecified infectious disease: Secondary | ICD-10-CM

## 2024-03-21 DIAGNOSIS — G43709 Chronic migraine without aura, not intractable, without status migrainosus: Secondary | ICD-10-CM | POA: Diagnosis not present

## 2024-03-21 MED ORDER — LEVOCETIRIZINE DIHYDROCHLORIDE 5 MG PO TABS: 5.0000 mg | ORAL_TABLET | Freq: Every evening | ORAL | 5 refills | Status: AC

## 2024-03-22 ENCOUNTER — Encounter: Payer: Self-pay | Admitting: Internal Medicine

## 2024-03-22 ENCOUNTER — Ambulatory Visit: Attending: Internal Medicine | Admitting: Internal Medicine

## 2024-03-22 ENCOUNTER — Encounter: Payer: Self-pay | Admitting: Family Medicine

## 2024-03-22 VITALS — BP 120/64 | HR 88 | Ht 64.0 in | Wt 178.6 lb

## 2024-03-22 DIAGNOSIS — J3089 Other allergic rhinitis: Secondary | ICD-10-CM | POA: Insufficient documentation

## 2024-03-22 DIAGNOSIS — R931 Abnormal findings on diagnostic imaging of heart and coronary circulation: Secondary | ICD-10-CM

## 2024-03-22 DIAGNOSIS — T7800XA Anaphylactic reaction due to unspecified food, initial encounter: Secondary | ICD-10-CM | POA: Insufficient documentation

## 2024-03-22 DIAGNOSIS — B999 Unspecified infectious disease: Secondary | ICD-10-CM | POA: Insufficient documentation

## 2024-03-22 NOTE — Telephone Encounter (Signed)
 Submitted Patient Assistance Application to [employer] for ENBREL  along with patient portion, provider portion, insurance card copy, prior authorization approval, and current medication list. Will update patient when we receive a response.  Phone #: [phone] Fax #: [phone]

## 2024-03-22 NOTE — Patient Instructions (Addendum)

## 2024-03-22 NOTE — Progress Notes (Signed)
 "    Cardiology Office Note  Date: 03/22/2024   ID: Christina Lewis, Christina Lewis 04-Oct-1955, MRN [id number]  PCP:  Severa Rock HERO, FNP  Cardiologist:  Diannah SHAUNNA Maywood, MD Electrophysiologist:  None   History of Present Illness: Christina Lewis is a 69 y.o. female  Referred to cardiology clinic for evaluation of DOE.  Patient has a history of rheumatoid arthritis on methotrexate  and Plaquenil .  She has Botox  to her esophagus.  Has chest pains after meals.  No chest pains with exertion.  DOE improved some with inhalers.  She also has OSA s/p inspire's device implantation.  But she also has nocturnal hypoxia for which her PCP is working.  She says she has evaluation with sleep medicine pending.  Does not have other symptoms of dizziness, syncope, palpitations, leg swelling.  She got the first dose of Repatha  last week.  No side effects.  Past Medical History:  Diagnosis Date   Achilles tendonitis    right foot   Allergy     Anxiety    Arthritis 2007   Cataract 2020   Depression 2012   Eczema    Fibromyalgia    GERD (gastroesophageal reflux disease) 2008   Gout    no current problem   Hematuria    History of kidney stones    passed stones and also had surgery to remove one   History of tics    Hyperlipidemia 2007   Ineffective esophageal motility    tx with prilosec, essophageal was stretched   Interstitial cystitis 2016   Migraines    Osteoarthritis    PCOS (polycystic ovarian syndrome) 1993   Plantar fasciitis, bilateral    Pneumonia 2019   PONV (postoperative nausea and vomiting)    Pre-diabetes    Recurrent upper respiratory infection (URI)    Sleep apnea 1998   inspire device   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, took medication for a year   Thyroid  nodule    Dr Bella is monitoring every year   Type 2 diabetes mellitus in patient with obesity (HCC) 03/04/2021    IMO SNOMED Dx Update Oct 2024    Past Surgical History:  Procedure Laterality  Date   ABDOMINAL HYSTERECTOMY  2000   APPENDECTOMY  1976   BALLOON DILATION N/A 11/03/2021   Procedure: MERRILL DILATION;  Surgeon: Saintclair Jasper, MD;  Location: [facility];  Service: Gastroenterology;  Laterality: N/A;   BIOPSY OF SKIN SUBCUTANEOUS TISSUE AND/OR MUCOUS MEMBRANE N/A 02/28/2024   Procedure: BIOPSY, SKIN, SUBCUTANEOUS TISSUE, OR MUCOUS MEMBRANE;  Surgeon: Saintclair Jasper, MD;  Location: [facility];  Service: Gastroenterology;  Laterality: N/A;   BOTOX  INJECTION N/A 11/03/2021   Procedure: BOTOX  INJECTION;  Surgeon: Saintclair Jasper, MD;  Location: [facility];  Service: Gastroenterology;  Laterality: N/A;   BOTOX  INJECTION N/A 02/28/2024   Procedure: BOTOX  INJECTION;  Surgeon: Saintclair Jasper, MD;  Location: [facility];  Service: Gastroenterology;  Laterality: N/A;   BREAST LUMPECTOMY  1989   lt-negative   CARDIOVASCULAR STRESS TEST  2000   CATARACT EXTRACTION W/PHACO Left 12/07/2019   Procedure: CATARACT EXTRACTION PHACO AND INTRAOCULAR LENS PLACEMENT (IOC);  Surgeon: Harrie Agent, MD;  Location: [facility];  Service: Ophthalmology;  Laterality: Left;  CDE: 5.55   CHOLECYSTECTOMY  1985   COLONOSCOPY     DRUG INDUCED ENDOSCOPY Bilateral 09/29/2022   Procedure: DRUG INDUCED ENDOSCOPY;  Surgeon: Okey Burns, MD;  Location: [facility];  Service: ENT;  Laterality: Bilateral;  15 MINUTES   ESOPHAGEAL MANOMETRY N/A 08/24/2021   Procedure: ESOPHAGEAL MANOMETRY (EM);  Surgeon: Saintclair Jasper, MD;  Location: [facility];  Service: Gastroenterology;  Laterality: N/A;   ESOPHAGOGASTRODUODENOSCOPY N/A 11/03/2021   Procedure: ESOPHAGOGASTRODUODENOSCOPY (EGD);  Surgeon: Saintclair Jasper, MD;  Location: [facility];  Service: Gastroenterology;  Laterality: N/A;   ESOPHAGOGASTRODUODENOSCOPY N/A 02/28/2024   Procedure: EGD (ESOPHAGOGASTRODUODENOSCOPY);  Surgeon: Saintclair Jasper, MD;  Location: [facility];  Service: Gastroenterology;  Laterality: N/A;   EYE SURGERY  2021   HYSTERECTOMY ABDOMINAL  WITH SALPINGECTOMY Bilateral 2000   IMPLANTATION OF HYPOGLOSSAL NERVE STIMULATOR Right 11/24/2022   Procedure: IMPLANTATION OF HYPOGLOSSAL NERVE STIMULATOR;  Surgeon: Okey Burns, MD;  Location: [facility];  Service: ENT;  Laterality: Right;   KIDNEY STONE SURGERY Right 2014   with stent placement in OR   KNEE ARTHROSCOPY Right 08/22/2013   Procedure: ARTHROSCOPY RIGHT KNEE FOR INFECTION LAVAGE AND DRAINAGE;  Surgeon: LELON JONETTA Shari Mickey., MD;  Location: [facility];  Service: Orthopedics;  Laterality: Right;   KNEE ARTHROSCOPY WITH PATELLA RECONSTRUCTION Right 08/06/2013   Procedure: RIGHT KNEE ARTHROSCOPY WITH MENISCECTOMY MEDIAL, ARTHROSCOPY KNEE WITH DEBRIDEMENT/SHAVING (CHONDROPLASTY) ;  Surgeon: LELON JONETTA Shari Mickey., MD;  Location: [facility];  Service: Orthopedics;  Laterality: Right;   LUMBAR LAMINECTOMY/DECOMPRESSION MICRODISCECTOMY Right 09/11/2019   Procedure: Right Lumbar Three-Four Microdiscectomy;  Surgeon: Unice Pac, MD;  Location: [facility];  Service: Neurosurgery;  Laterality: Right;  Right Lumbar Three-Four Microdiscectomy   NASAL SINUS SURGERY     x4   OOPHORECTOMY     SHOULDER SURGERY  2011   right shoulder   SPINE SURGERY  2020   THYROID  SURGERY  1994   TONSILLECTOMY Bilateral 1974    Current Outpatient Medications  Medication Sig Dispense Refill   albuterol  (VENTOLIN  HFA) 108 (90 Base) MCG/ACT inhaler Inhale 1-2 puffs into the lungs every 6 (six) hours as needed for wheezing or shortness of breath. 8 g 2   azelastine  (ASTELIN ) 0.1 % nasal spray Place 2 sprays into both nostrils 2 (two) times daily. Use in each nostril as directed 30 mL 12   baclofen  (LIORESAL ) 10 MG tablet TAKE 1 TABLET BY MOUTH IN THE MORNING AND 1 TABLET BY MOUTH AT NOON 60 tablet 0   budesonide  (PULMICORT ) 0.5 MG/2ML nebulizer solution USE 1 VIAL IN NEBULIZER IN THE MORNING AND AT BEDTIME. MIX 1 VIAL WITH 250 CC (ML) OF NORMAL SALINE FOR SINUS WASH DAILY AS NEEDED     cefdinir   (OMNICEF ) 300 MG capsule Take 1 capsule (300 mg total) by mouth 2 (two) times daily for 14 days. 28 capsule 0   cholecalciferol (VITAMIN D ) 25 MCG (1000 UNIT) tablet Take 1,000 Units by mouth daily.     EMBECTA PEN NEEDLE NANO 2 GEN 32G X 4 MM MISC SMARTSIG:1 pen needle Daily     EPINEPHrine  0.3 mg/0.3 mL IJ SOAJ injection Inject 0.3 mg into the muscle as needed for anaphylaxis. 0.3 mL 1   Eptinezumab -jjmr (VYEPTI  IV) Inject 300 mg into the vein every 3 (three) months.     etanercept  (ENBREL  SURECLICK) 50 MG/ML injection Inject 50 mg into the skin once a week. 4 mL 2   folic acid  (FOLVITE ) 1 MG tablet TAKE TWO TABLETS BY MOUTH EVERY DAY 180 tablet 3   gabapentin  (NEURONTIN ) 100 MG capsule Take 100 mg by mouth daily.     hydroxychloroquine  (PLAQUENIL ) 200 MG tablet TAKE 1 TABLET BY MOUTH TWICE DAILY MONDAY  THRU  FRIDAY  FOR  RHEUMATOID  ARTHRITIS 120 tablet 0   hydrOXYzine  (ATARAX ) 10 MG tablet Take 10 mg by mouth at bedtime.     insulin  glargine (LANTUS) 100 UNIT/ML injection      Lancets (ONETOUCH DELICA PLUS LANCET33G) MISC SMARTSIG:1 Topical Daily     levocetirizine (XYZAL ) 5 MG tablet Take 1 tablet (5 mg total) by mouth every evening. 30 tablet 5   MAGNESIUM  PO Take 500 mg by mouth at bedtime.     methotrexate  50 MG/2ML injection Inject 0.8 mLs (20 mg total) into the skin once a week. 10 mL 0   Multiple Vitamin (MULTIVITAMIN WITH MINERALS) TABS tablet Take 1 tablet by mouth daily.     Omega 3 1000 MG CAPS Take 1,000 mg by mouth daily.     omeprazole (PRILOSEC) 20 MG capsule Take 40 mg by mouth daily.     ondansetron  (ZOFRAN -ODT) 4 MG disintegrating tablet Take 1 tablet (4 mg total) by mouth every 8 (eight) hours as needed for nausea. 20 tablet 5   ONETOUCH VERIO test strip 1 each daily.     Polyvinyl Alcohol -Povidone (REFRESH OP) Apply to eye as needed.     REPATHA  SURECLICK 140 MG/ML SOAJ      Rimegepant Sulfate (NURTEC) 75 MG TBDP Take 1 tablet at the onset of a migraine.  Only 1 tablet  in 24 hours 8 tablet 11   tacrolimus  (PROTOPIC ) 0.1 % ointment Apply topically 2 (two) times daily. 60 g 6   tiZANidine  (ZANAFLEX ) 4 MG tablet TAKE 1 TABLET BY MOUTH AT BEDTIME AS NEEDED 30 tablet 0   TUBERCULIN SYR 1CC/27GX1/2 (B-D TB SYRINGE 1CC/27GX1/2) 27G X 1/2 1 ML MISC Patient to use to inject Methotrexate  once weekly. 12 each 3   TURMERIC-GINGER -BLACK PEPPER PO Take by mouth.     No current facility-administered medications for this visit.   Allergies:  Sulfa antibiotics, Sulfamethoxazole-trimethoprim, Atorvastatin, Codeine, Empagliflozin, Erythromycin, Metronidazole , Morphine and codeine, Semaglutide, and Sitagliptin   Social History: The patient  reports that she has never smoked. She has been exposed to tobacco smoke. She has never used smokeless tobacco. She reports that she does not drink alcohol  and does not use drugs.   Family History: The patient's family history includes Arthritis in her mother, sister, and sister; Asthma in her mother, sister, and sister; Diabetes in her mother, sister, and sister; Fibromyalgia in her mother; Heart disease in her mother; Hyperlipidemia in her mother, sister, and sister; Hypertension in her mother and sister; Miscarriages / Stillbirths in her sister and sister; Psoriasis in her mother and sister; Rheum arthritis in her sister; Stroke in her mother.   ROS:  Please see the history of present illness. Otherwise, complete review of systems is positive for none  All other systems are reviewed and negative.   Physical Exam: VS:  BP 120/64 (BP Location: Left Arm, Patient Position: Sitting, Cuff Size: Normal)   Pulse 88   Ht 5' 4 (1.626 m)   Wt 178 lb 9.6 oz (81 kg)   SpO2 98%   BMI 30.66 kg/m , BMI Body mass index is 30.66 kg/m.  Wt Readings from Last 3 Encounters:  03/22/24 178 lb 9.6 oz (81 kg)  03/21/24 175 lb 4 oz (79.5 kg)  03/20/24 177 lb 6.4 oz (80.5 kg)    General: Patient appears comfortable at rest. HEENT: Conjunctiva and lids  normal, oropharynx clear with moist mucosa. Neck: Supple, no elevated JVP or carotid bruits, no thyromegaly. Lungs: Clear to auscultation, nonlabored breathing  at rest. Cardiac: Regular rate and rhythm, no S3 or significant systolic murmur, no pericardial rub. Abdomen: Soft, nontender, no hepatomegaly, bowel sounds present, no guarding or rebound. Extremities: No pitting edema, distal pulses 2+. Skin: Warm and dry. Musculoskeletal: No kyphosis. Neuropsychiatric: Alert and oriented x3, affect grossly appropriate.  Recent Labwork: 02/17/2024: ALT 39; AST 28; BUN 21; Creatinine, Ser 0.69; Hemoglobin 14.2; Platelets 442; Potassium 4.2; Sodium 141; TSH 1.100     Component Value Date/Time   CHOL 266 (H) 02/17/2024 1437   TRIG 356 (H) 02/17/2024 1437   HDL 53 02/17/2024 1437   CHOLHDL 5.0 (H) 02/17/2024 1437   LDLCALC 147 (H) 02/17/2024 1437    Assessment and Plan:  DOE - DOE multifactorial.  Improved some with inhalers.  She also has OSA s/p inspire's device implantation however has nocturnal hypoxia.  She has evaluation with sleep medicine pending.  Will reevaluate her symptoms in 6 months.  Noncardiac chest pain - Chest pains occur after meals.  She had Botox  injection to her esophagus in the past.  Noncardiac.  Hyperlipidemia, not at goal Hypertriglyceridemia, not at goal Elevated coronary calcium score - Coronary calcium score is 20.8, 63rd percentile for age and sex matched control.  Intolerant to statins.  Start Repatha  140 mg  every 2 weeks.  She received first dose of Repatha  last week. - Fasting lipid panel in 3 months, can be done with PCP. - OTC fish oil supplements.   30 minutes spent in reviewing prior records, imaging, test/reports, discussion of the above problems with the patient, documentation and answering all the questions.     Medication Adjustments/Labs and Tests Ordered: Current medicines are reviewed at length with the patient today.  Concerns regarding  medicines are outlined above.    Disposition:  Follow up 6 months  Signed Arena Lindahl Priya Jeannett Dekoning, MD, 03/22/2024 11:20 AM    [facility] [address] [place], [place], [place] [address]  "

## 2024-03-23 ENCOUNTER — Ambulatory Visit

## 2024-03-23 ENCOUNTER — Telehealth: Payer: Self-pay | Admitting: Acute Care

## 2024-03-23 DIAGNOSIS — J302 Other seasonal allergic rhinitis: Secondary | ICD-10-CM | POA: Diagnosis not present

## 2024-03-23 DIAGNOSIS — G4734 Idiopathic sleep related nonobstructive alveolar hypoventilation: Secondary | ICD-10-CM

## 2024-03-23 NOTE — Telephone Encounter (Signed)
 Per Dolanda at [facility]-  this order is for ono on o2 but patient is not on o2 so confused on what is needing done if this is to see if she need o2 we would need ono to be done on RA so it will show her that she needs O2.  Please advise

## 2024-03-26 NOTE — Telephone Encounter (Signed)
 Per [email or web address], application for Enbrel  has been received as of 03/22/2024

## 2024-03-27 ENCOUNTER — Ambulatory Visit: Admitting: Acute Care

## 2024-03-27 ENCOUNTER — Other Ambulatory Visit: Payer: Self-pay | Admitting: Gastroenterology

## 2024-03-27 ENCOUNTER — Ambulatory Visit: Payer: Self-pay | Admitting: Family Medicine

## 2024-03-27 DIAGNOSIS — R112 Nausea with vomiting, unspecified: Secondary | ICD-10-CM

## 2024-03-27 DIAGNOSIS — R1310 Dysphagia, unspecified: Secondary | ICD-10-CM

## 2024-03-27 LAB — ALLERGY PROFILE, FOOD
Allergen Salmon IgE: <0.1 kU/L
Codfish IgE: <0.1 kU/L
F001-IgE Egg White: <0.1 kU/L
F002-IgE Milk: 0.25 kU/L — AB
F004-IgE Wheat: <0.1 kU/L
F010-IgE Sesame Seed: <0.1 kU/L
F017-IgE Hazelnut (Filbert): <0.1 kU/L
F018-IgE Brazil Nut: <0.1 kU/L
F020-IgE Almond: <0.1 kU/L
F202-IgE Cashew Nut: <0.1 kU/L
F256-IgE Walnut: <0.1 kU/L
F416-IgE Tri a 19(w-5 gliadin): <0.1 kU/L
Peanut, IgE: <0.1 kU/L
Scallop IgE: <0.1 kU/L
Shrimp IgE: <0.1 kU/L
Soybean IgE: <0.1 kU/L
Tuna: <0.1 kU/L

## 2024-03-27 NOTE — Progress Notes (Signed)
 Can you please let this patient know that milk was borderline positive on lab testing. Can you please ask her is she is drinking cow's milk or eating dairy products? Thank you

## 2024-03-28 ENCOUNTER — Encounter: Payer: Self-pay | Admitting: Family Medicine

## 2024-03-28 ENCOUNTER — Other Ambulatory Visit (HOSPITAL_COMMUNITY): Payer: Self-pay

## 2024-03-28 ENCOUNTER — Telehealth: Payer: Self-pay

## 2024-03-28 ENCOUNTER — Encounter: Payer: Self-pay | Admitting: Adult Health

## 2024-03-28 DIAGNOSIS — B999 Unspecified infectious disease: Secondary | ICD-10-CM

## 2024-03-28 NOTE — Telephone Encounter (Signed)
 PA request has been Submitted. New Encounter has been or will be created for follow up. For additional info see Pharmacy Prior Auth telephone encounter from 03-28-2024.

## 2024-03-28 NOTE — Telephone Encounter (Signed)
 Pharmacy Patient Advocate Encounter   Received notification from Patient Advice Request messages that prior authorization for Nurtec 75MG  dispersible tablets is required/requested.   Insurance verification completed.   The patient is insured through [facility] ADVANT[employer]/RX ADVANCE.   Per test claim: PA required; PA submitted to above mentioned insurance via Latent Key/confirmation #/[id number] [facility] Status is pending

## 2024-03-28 NOTE — Telephone Encounter (Signed)
 Noted.

## 2024-03-28 NOTE — Telephone Encounter (Signed)
 Prescriber portion of application completed and is pending [name] NP's signature.

## 2024-03-29 ENCOUNTER — Ambulatory Visit: Admitting: Physician Assistant

## 2024-03-29 ENCOUNTER — Encounter: Payer: Self-pay | Admitting: Physician Assistant

## 2024-03-29 VITALS — BP 115/75 | HR 76 | Temp 98.0°F | Resp 14 | Ht 64.0 in | Wt 175.0 lb

## 2024-03-29 DIAGNOSIS — Z840 Family history of diseases of the skin and subcutaneous tissue: Secondary | ICD-10-CM

## 2024-03-29 DIAGNOSIS — M5030 Other cervical disc degeneration, unspecified cervical region: Secondary | ICD-10-CM | POA: Diagnosis not present

## 2024-03-29 DIAGNOSIS — M1711 Unilateral primary osteoarthritis, right knee: Secondary | ICD-10-CM | POA: Diagnosis not present

## 2024-03-29 DIAGNOSIS — E782 Mixed hyperlipidemia: Secondary | ICD-10-CM

## 2024-03-29 DIAGNOSIS — M19071 Primary osteoarthritis, right ankle and foot: Secondary | ICD-10-CM | POA: Diagnosis not present

## 2024-03-29 DIAGNOSIS — M533 Sacrococcygeal disorders, not elsewhere classified: Secondary | ICD-10-CM

## 2024-03-29 DIAGNOSIS — M79642 Pain in left hand: Secondary | ICD-10-CM

## 2024-03-29 DIAGNOSIS — Z8742 Personal history of other diseases of the female genital tract: Secondary | ICD-10-CM

## 2024-03-29 DIAGNOSIS — M79641 Pain in right hand: Secondary | ICD-10-CM | POA: Diagnosis not present

## 2024-03-29 DIAGNOSIS — R5383 Other fatigue: Secondary | ICD-10-CM

## 2024-03-29 DIAGNOSIS — M797 Fibromyalgia: Secondary | ICD-10-CM

## 2024-03-29 DIAGNOSIS — Z872 Personal history of diseases of the skin and subcutaneous tissue: Secondary | ICD-10-CM

## 2024-03-29 DIAGNOSIS — Z79899 Other long term (current) drug therapy: Secondary | ICD-10-CM | POA: Diagnosis not present

## 2024-03-29 DIAGNOSIS — M51369 Other intervertebral disc degeneration, lumbar region without mention of lumbar back pain or lower extremity pain: Secondary | ICD-10-CM | POA: Diagnosis not present

## 2024-03-29 DIAGNOSIS — G8929 Other chronic pain: Secondary | ICD-10-CM

## 2024-03-29 DIAGNOSIS — Z9289 Personal history of other medical treatment: Secondary | ICD-10-CM

## 2024-03-29 DIAGNOSIS — Z8669 Personal history of other diseases of the nervous system and sense organs: Secondary | ICD-10-CM

## 2024-03-29 DIAGNOSIS — Z8639 Personal history of other endocrine, nutritional and metabolic disease: Secondary | ICD-10-CM

## 2024-03-29 DIAGNOSIS — Z8659 Personal history of other mental and behavioral disorders: Secondary | ICD-10-CM

## 2024-03-29 DIAGNOSIS — Z8261 Family history of arthritis: Secondary | ICD-10-CM | POA: Diagnosis not present

## 2024-03-29 DIAGNOSIS — Z8719 Personal history of other diseases of the digestive system: Secondary | ICD-10-CM

## 2024-03-29 DIAGNOSIS — Z87442 Personal history of urinary calculi: Secondary | ICD-10-CM

## 2024-03-29 DIAGNOSIS — M0609 Rheumatoid arthritis without rheumatoid factor, multiple sites: Secondary | ICD-10-CM | POA: Diagnosis not present

## 2024-03-29 DIAGNOSIS — M25511 Pain in right shoulder: Secondary | ICD-10-CM | POA: Diagnosis not present

## 2024-03-29 DIAGNOSIS — M19072 Primary osteoarthritis, left ankle and foot: Secondary | ICD-10-CM

## 2024-03-29 NOTE — Telephone Encounter (Signed)
 Order replaced

## 2024-03-29 NOTE — Progress Notes (Signed)
 Thank you :)

## 2024-03-29 NOTE — Telephone Encounter (Signed)
 Per Dolanda at [facility]- Patient is saying she is not using inspire (o2) so we would not be able to do an ono on O2 unless we get an  O2 order and qualifing sats to create order for new O2 patient

## 2024-03-29 NOTE — Telephone Encounter (Signed)
 Prescriber portion (prescription) of [employer] PAP application has been completed, signed, and faxed to [facility]. Received a receipt of confirmation.  [phone]

## 2024-03-29 NOTE — Telephone Encounter (Signed)
 Order has been sent to [facility]. NFN

## 2024-03-29 NOTE — Addendum Note (Signed)
 Addended by: MELVENIA WILFORD SAUNDERS on: 03/29/2024 02:11 PM   Modules accepted: Orders

## 2024-03-29 NOTE — Telephone Encounter (Signed)
 Christina Lewis is a device not oxygen we do not manage it [facility] does but she does use she just saw them today to discuss issues with the inspire device so she does it,the order does not say to use with oxygen,the test is room air,it is specifying to make sure the patient inspire device is turned on during the test to make sure it is working properly while she is sleeping.  Please do this with Inspire in use. We will monitor for drops in oxygen while Inspire support is on.    Do test with inspire turned on,then explaining why we need it to be to see if their are any drops in oxygen,this does not mean with oxygen just that we need to monitor them,test is done on room air with patient inspire device turned on

## 2024-03-29 NOTE — Telephone Encounter (Signed)
 Could you add on room air with Inspire sleeping device on or in use

## 2024-03-29 NOTE — Patient Instructions (Signed)
 Standing Labs We placed an order today for your standing lab work.   Please have your standing labs drawn at the end of march and every 3 months    Please have your labs drawn 2 weeks prior to your appointment so that the provider can discuss your lab results at your appointment, if possible.  Please note that you may see your imaging and lab results in MyChart before we have reviewed them. We will contact you once all results are reviewed. Please allow our office up to 72 hours to thoroughly review all of the results before contacting the office for clarification of your results.  WALK-IN LAB HOURS  Monday through Thursday from 8:00 am - 4:00 pm and Friday from 8:00 am-12:00 pm.  Patients with office visits requiring labs will be seen before walk-in labs.  You may encounter longer than normal wait times. Please allow additional time. Wait times may be shorter on  Monday and Thursday afternoons.  We do not book appointments for walk-in labs. We appreciate your patience and understanding with our staff.   Labs are drawn by [facility]. Please bring your co-pay at the time of your lab draw.  You may receive a bill from [facility] for your lab work.  Please note if you are on Hydroxychloroquine  and and an order has been placed for a Hydroxychloroquine  level,  you will need to have it drawn 4 hours or more after your last dose.  If you wish to have your labs drawn at another location, please call the office 24 hours in advance so we can fax the orders.  The office is located at [address], [place], [place] [address]   If you have any questions regarding directions or hours of operation,  please call [phone].   As a reminder, please drink plenty of water prior to coming for your lab work. Thanks!

## 2024-03-29 NOTE — Telephone Encounter (Signed)
"  Is there any update on this?   "

## 2024-03-29 NOTE — Progress Notes (Signed)
 Medication Samples have been provided to the patient.  Drug name: enbrel  sureclick       Strength: 50mg /mL        Qty: 1  LOT: [id number]  Exp.Date: 05/22/2024  Dosing instructions: Inject 50mg  into the skin once weekly.

## 2024-03-29 NOTE — Telephone Encounter (Signed)
 I have informed [facility]. NFN

## 2024-03-29 NOTE — Telephone Encounter (Signed)
 It should be with inspire on,  Please do this with Inspire in use. We will monitor for drops in oxygen while Inspire support is on. -is what the note says in order

## 2024-04-09 ENCOUNTER — Encounter: Admitting: Physical Medicine and Rehabilitation

## 2024-04-09 ENCOUNTER — Ambulatory Visit: Admitting: Physician Assistant

## 2024-04-13 ENCOUNTER — Ambulatory Visit: Admitting: Internal Medicine

## 2024-05-01 ENCOUNTER — Other Ambulatory Visit (HOSPITAL_COMMUNITY): Payer: Self-pay

## 2024-05-03 ENCOUNTER — Other Ambulatory Visit

## 2024-05-08 ENCOUNTER — Ambulatory Visit (HOSPITAL_COMMUNITY): Admit: 2024-05-08 | Admitting: Optometry

## 2024-05-08 SURGERY — PHACOEMULSIFICATION, CATARACT, WITH IOL INSERTION
Anesthesia: Monitor Anesthesia Care | Laterality: Right

## 2024-05-09 ENCOUNTER — Ambulatory Visit: Admitting: Family Medicine

## 2024-05-14 ENCOUNTER — Other Ambulatory Visit (HOSPITAL_COMMUNITY)

## 2024-05-24 ENCOUNTER — Ambulatory Visit: Admitting: Physician Assistant

## 2024-07-17 ENCOUNTER — Ambulatory Visit (INDEPENDENT_AMBULATORY_CARE_PROVIDER_SITE_OTHER): Admitting: Audiology

## 2024-07-17 ENCOUNTER — Ambulatory Visit (INDEPENDENT_AMBULATORY_CARE_PROVIDER_SITE_OTHER): Admitting: Otolaryngology

## 2024-08-08 ENCOUNTER — Ambulatory Visit: Admitting: Adult Health

## 2024-08-14 ENCOUNTER — Telehealth: Admitting: Adult Health

## 2024-08-21 ENCOUNTER — Ambulatory Visit: Admitting: Family Medicine

## 2024-09-06 ENCOUNTER — Ambulatory Visit: Admitting: Rheumatology

## 2024-09-21 ENCOUNTER — Ambulatory Visit: Admitting: Family Medicine

## 2024-10-17 ENCOUNTER — Ambulatory Visit: Admitting: Dermatology

## 2025-02-19 ENCOUNTER — Encounter: Admitting: Family Medicine
